# Patient Record
Sex: Female | Born: 1980 | Race: White | Hispanic: No | Marital: Single | Smoking: Former smoker
Health system: Southern US, Community
[De-identification: ages and names within clinical notes are randomized; demographics above are authoritative.]

## PROBLEM LIST (undated history)

## (undated) DIAGNOSIS — M329 Systemic lupus erythematosus, unspecified: Secondary | ICD-10-CM

## (undated) DIAGNOSIS — E039 Hypothyroidism, unspecified: Secondary | ICD-10-CM

## (undated) DIAGNOSIS — F419 Anxiety disorder, unspecified: Secondary | ICD-10-CM

## (undated) DIAGNOSIS — R000 Tachycardia, unspecified: Secondary | ICD-10-CM

## (undated) DIAGNOSIS — E119 Type 2 diabetes mellitus without complications: Secondary | ICD-10-CM

## (undated) DIAGNOSIS — F329 Major depressive disorder, single episode, unspecified: Secondary | ICD-10-CM

## (undated) DIAGNOSIS — E079 Disorder of thyroid, unspecified: Secondary | ICD-10-CM

## (undated) DIAGNOSIS — K7460 Unspecified cirrhosis of liver: Secondary | ICD-10-CM

## (undated) DIAGNOSIS — J45909 Unspecified asthma, uncomplicated: Secondary | ICD-10-CM

## (undated) DIAGNOSIS — K8020 Calculus of gallbladder without cholecystitis without obstruction: Secondary | ICD-10-CM

## (undated) DIAGNOSIS — F319 Bipolar disorder, unspecified: Secondary | ICD-10-CM

## (undated) DIAGNOSIS — K760 Fatty (change of) liver, not elsewhere classified: Secondary | ICD-10-CM

## (undated) DIAGNOSIS — G8929 Other chronic pain: Secondary | ICD-10-CM

## (undated) DIAGNOSIS — R51 Headache: Secondary | ICD-10-CM

## (undated) DIAGNOSIS — K219 Gastro-esophageal reflux disease without esophagitis: Secondary | ICD-10-CM

## (undated) DIAGNOSIS — E785 Hyperlipidemia, unspecified: Secondary | ICD-10-CM

## (undated) DIAGNOSIS — R911 Solitary pulmonary nodule: Secondary | ICD-10-CM

## (undated) DIAGNOSIS — F32A Depression, unspecified: Secondary | ICD-10-CM

## (undated) DIAGNOSIS — R932 Abnormal findings on diagnostic imaging of liver and biliary tract: Secondary | ICD-10-CM

## (undated) DIAGNOSIS — E559 Vitamin D deficiency, unspecified: Secondary | ICD-10-CM

## (undated) DIAGNOSIS — I10 Essential (primary) hypertension: Secondary | ICD-10-CM

## (undated) HISTORY — DX: Vitamin D deficiency, unspecified: E55.9

## (undated) HISTORY — DX: Abnormal findings on diagnostic imaging of liver and biliary tract: R93.2

## (undated) HISTORY — DX: Fatty (change of) liver, not elsewhere classified: K76.0

## (undated) HISTORY — DX: Hyperlipidemia, unspecified: E78.5

## (undated) HISTORY — DX: Bipolar disorder, unspecified: F31.9

## (undated) HISTORY — DX: Calculus of gallbladder without cholecystitis without obstruction: K80.20

## (undated) HISTORY — DX: Depression, unspecified: F32.A

## (undated) HISTORY — DX: Unspecified asthma, uncomplicated: J45.909

## (undated) HISTORY — DX: Tachycardia, unspecified: R00.0

## (undated) HISTORY — DX: Essential (primary) hypertension: I10

## (undated) HISTORY — DX: Systemic lupus erythematosus, unspecified: M32.9

## (undated) HISTORY — DX: Other chronic pain: G89.29

## (undated) HISTORY — DX: Headache: R51

## (undated) HISTORY — DX: Anxiety disorder, unspecified: F41.9

## (undated) HISTORY — DX: Hypothyroidism, unspecified: E03.9

## (undated) HISTORY — DX: Unspecified cirrhosis of liver: K74.60

## (undated) HISTORY — DX: Gastro-esophageal reflux disease without esophagitis: K21.9

## (undated) HISTORY — DX: Major depressive disorder, single episode, unspecified: F32.9

## (undated) HISTORY — DX: Solitary pulmonary nodule: R91.1

## (undated) HISTORY — DX: Type 2 diabetes mellitus without complications: E11.9

## (undated) HISTORY — PX: DILATION AND CURETTAGE OF UTERUS: SHX78

## (undated) HISTORY — DX: Disorder of thyroid, unspecified: E07.9

---
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
## 2003-07-23 ENCOUNTER — Emergency Department (HOSPITAL_COMMUNITY): Admission: EM | Admit: 2003-07-23 | Discharge: 2003-07-23 | Payer: Self-pay | Admitting: Emergency Medicine

## 2003-08-06 ENCOUNTER — Emergency Department (HOSPITAL_COMMUNITY): Admission: EM | Admit: 2003-08-06 | Discharge: 2003-08-06 | Payer: Self-pay | Admitting: Emergency Medicine

## 2003-09-10 ENCOUNTER — Ambulatory Visit (HOSPITAL_COMMUNITY): Admission: RE | Admit: 2003-09-10 | Discharge: 2003-09-10 | Payer: Self-pay | Admitting: Neurology

## 2003-09-29 ENCOUNTER — Ambulatory Visit (HOSPITAL_COMMUNITY): Admission: RE | Admit: 2003-09-29 | Discharge: 2003-09-29 | Payer: Self-pay | Admitting: Family Medicine

## 2004-07-11 ENCOUNTER — Other Ambulatory Visit: Admission: RE | Admit: 2004-07-11 | Discharge: 2004-07-11 | Payer: Self-pay | Admitting: Family Medicine

## 2004-07-12 ENCOUNTER — Other Ambulatory Visit: Admission: RE | Admit: 2004-07-12 | Discharge: 2004-07-12 | Payer: Self-pay | Admitting: Family Medicine

## 2005-09-18 ENCOUNTER — Ambulatory Visit (HOSPITAL_COMMUNITY): Payer: Self-pay | Admitting: Psychiatry

## 2005-10-09 ENCOUNTER — Ambulatory Visit (HOSPITAL_COMMUNITY): Payer: Self-pay | Admitting: Psychiatry

## 2005-11-05 ENCOUNTER — Ambulatory Visit (HOSPITAL_COMMUNITY): Payer: Self-pay | Admitting: Psychiatry

## 2005-11-26 ENCOUNTER — Ambulatory Visit (HOSPITAL_COMMUNITY): Payer: Self-pay | Admitting: Psychiatry

## 2005-12-24 ENCOUNTER — Ambulatory Visit (HOSPITAL_COMMUNITY): Payer: Self-pay | Admitting: Psychiatry

## 2006-01-02 ENCOUNTER — Inpatient Hospital Stay (HOSPITAL_COMMUNITY): Admission: AD | Admit: 2006-01-02 | Discharge: 2006-01-03 | Payer: Self-pay | Admitting: Psychiatry

## 2006-01-02 ENCOUNTER — Ambulatory Visit: Payer: Self-pay | Admitting: Psychiatry

## 2006-01-09 ENCOUNTER — Ambulatory Visit (HOSPITAL_COMMUNITY): Payer: Self-pay | Admitting: Psychiatry

## 2006-01-21 ENCOUNTER — Encounter: Admission: RE | Admit: 2006-01-21 | Discharge: 2006-01-21 | Payer: Self-pay | Admitting: Obstetrics and Gynecology

## 2006-02-11 ENCOUNTER — Ambulatory Visit (HOSPITAL_COMMUNITY): Payer: Self-pay | Admitting: Psychiatry

## 2006-02-18 ENCOUNTER — Encounter: Admission: RE | Admit: 2006-02-18 | Discharge: 2006-02-18 | Payer: Self-pay | Admitting: Obstetrics and Gynecology

## 2006-03-19 ENCOUNTER — Ambulatory Visit (HOSPITAL_COMMUNITY): Payer: Self-pay | Admitting: Psychiatry

## 2006-04-09 ENCOUNTER — Emergency Department (HOSPITAL_COMMUNITY): Admission: EM | Admit: 2006-04-09 | Discharge: 2006-04-10 | Payer: Self-pay | Admitting: Emergency Medicine

## 2006-05-06 ENCOUNTER — Ambulatory Visit (HOSPITAL_COMMUNITY): Payer: Self-pay | Admitting: Psychiatry

## 2006-06-12 ENCOUNTER — Ambulatory Visit (HOSPITAL_COMMUNITY): Payer: Self-pay | Admitting: Psychiatry

## 2006-06-24 ENCOUNTER — Ambulatory Visit (HOSPITAL_COMMUNITY): Payer: Self-pay | Admitting: Psychiatry

## 2006-07-17 ENCOUNTER — Ambulatory Visit (HOSPITAL_COMMUNITY): Payer: Self-pay | Admitting: Psychiatry

## 2006-07-24 ENCOUNTER — Emergency Department (HOSPITAL_COMMUNITY): Admission: EM | Admit: 2006-07-24 | Discharge: 2006-07-24 | Payer: Self-pay | Admitting: Emergency Medicine

## 2006-08-07 ENCOUNTER — Ambulatory Visit (HOSPITAL_COMMUNITY): Payer: Self-pay | Admitting: Psychiatry

## 2006-08-26 ENCOUNTER — Encounter: Admission: RE | Admit: 2006-08-26 | Discharge: 2006-08-28 | Payer: Self-pay | Admitting: Unknown Physician Specialty

## 2006-09-03 ENCOUNTER — Ambulatory Visit (HOSPITAL_COMMUNITY): Payer: Self-pay | Admitting: Psychiatry

## 2006-09-04 ENCOUNTER — Emergency Department (HOSPITAL_COMMUNITY): Admission: EM | Admit: 2006-09-04 | Discharge: 2006-09-04 | Payer: Self-pay | Admitting: Emergency Medicine

## 2006-09-28 ENCOUNTER — Emergency Department (HOSPITAL_COMMUNITY): Admission: EM | Admit: 2006-09-28 | Discharge: 2006-09-28 | Payer: Self-pay | Admitting: Emergency Medicine

## 2006-10-01 ENCOUNTER — Ambulatory Visit (HOSPITAL_COMMUNITY): Payer: Self-pay | Admitting: Psychiatry

## 2006-10-27 ENCOUNTER — Ambulatory Visit (HOSPITAL_COMMUNITY): Payer: Self-pay | Admitting: Psychiatry

## 2006-11-20 ENCOUNTER — Emergency Department (HOSPITAL_COMMUNITY): Admission: EM | Admit: 2006-11-20 | Discharge: 2006-11-20 | Payer: Self-pay | Admitting: Emergency Medicine

## 2006-12-22 ENCOUNTER — Ambulatory Visit (HOSPITAL_COMMUNITY): Payer: Self-pay | Admitting: Psychiatry

## 2006-12-28 ENCOUNTER — Ambulatory Visit (HOSPITAL_COMMUNITY): Payer: Self-pay | Admitting: Psychiatry

## 2007-01-12 ENCOUNTER — Ambulatory Visit: Payer: Self-pay | Admitting: Internal Medicine

## 2007-01-13 ENCOUNTER — Ambulatory Visit (HOSPITAL_COMMUNITY): Admission: RE | Admit: 2007-01-13 | Discharge: 2007-01-13 | Payer: Self-pay | Admitting: Internal Medicine

## 2007-01-27 ENCOUNTER — Ambulatory Visit (HOSPITAL_COMMUNITY): Payer: Self-pay | Admitting: Psychiatry

## 2007-02-03 ENCOUNTER — Ambulatory Visit: Payer: Self-pay | Admitting: Internal Medicine

## 2007-03-23 ENCOUNTER — Emergency Department (HOSPITAL_COMMUNITY): Admission: EM | Admit: 2007-03-23 | Discharge: 2007-03-23 | Payer: Self-pay | Admitting: Emergency Medicine

## 2007-04-19 ENCOUNTER — Telehealth (INDEPENDENT_AMBULATORY_CARE_PROVIDER_SITE_OTHER): Payer: Self-pay | Admitting: *Deleted

## 2007-04-19 DIAGNOSIS — F3289 Other specified depressive episodes: Secondary | ICD-10-CM | POA: Insufficient documentation

## 2007-04-19 DIAGNOSIS — J45909 Unspecified asthma, uncomplicated: Secondary | ICD-10-CM | POA: Insufficient documentation

## 2007-04-19 DIAGNOSIS — F329 Major depressive disorder, single episode, unspecified: Secondary | ICD-10-CM | POA: Insufficient documentation

## 2007-04-19 DIAGNOSIS — J45901 Unspecified asthma with (acute) exacerbation: Secondary | ICD-10-CM | POA: Insufficient documentation

## 2007-05-11 ENCOUNTER — Telehealth: Payer: Self-pay | Admitting: Internal Medicine

## 2007-05-12 ENCOUNTER — Ambulatory Visit: Payer: Self-pay | Admitting: Internal Medicine

## 2007-05-12 DIAGNOSIS — R918 Other nonspecific abnormal finding of lung field: Secondary | ICD-10-CM | POA: Insufficient documentation

## 2007-05-12 DIAGNOSIS — J984 Other disorders of lung: Secondary | ICD-10-CM | POA: Insufficient documentation

## 2007-05-14 ENCOUNTER — Telehealth: Payer: Self-pay | Admitting: Internal Medicine

## 2007-05-18 ENCOUNTER — Ambulatory Visit: Payer: Self-pay | Admitting: Internal Medicine

## 2007-05-18 DIAGNOSIS — J209 Acute bronchitis, unspecified: Secondary | ICD-10-CM | POA: Insufficient documentation

## 2007-05-18 LAB — CONVERTED CEMR LAB: Streptococcus, Group A Screen (Direct): NEGATIVE

## 2007-05-19 ENCOUNTER — Telehealth: Payer: Self-pay | Admitting: Internal Medicine

## 2007-05-20 ENCOUNTER — Ambulatory Visit: Payer: Self-pay | Admitting: Internal Medicine

## 2007-05-20 DIAGNOSIS — F17200 Nicotine dependence, unspecified, uncomplicated: Secondary | ICD-10-CM | POA: Insufficient documentation

## 2007-05-21 ENCOUNTER — Telehealth: Payer: Self-pay | Admitting: Pulmonary Disease

## 2007-05-24 ENCOUNTER — Encounter: Payer: Self-pay | Admitting: Adult Health

## 2007-05-24 ENCOUNTER — Telehealth (INDEPENDENT_AMBULATORY_CARE_PROVIDER_SITE_OTHER): Payer: Self-pay | Admitting: *Deleted

## 2007-05-24 ENCOUNTER — Ambulatory Visit: Payer: Self-pay | Admitting: Internal Medicine

## 2007-05-24 DIAGNOSIS — R05 Cough: Secondary | ICD-10-CM

## 2007-05-24 DIAGNOSIS — R32 Unspecified urinary incontinence: Secondary | ICD-10-CM | POA: Insufficient documentation

## 2007-05-24 DIAGNOSIS — R059 Cough, unspecified: Secondary | ICD-10-CM | POA: Insufficient documentation

## 2007-05-24 LAB — CONVERTED CEMR LAB: Blood Glucose, Fingerstick: 112

## 2007-05-25 LAB — CONVERTED CEMR LAB
Bacteria, UA: NEGATIVE
Bilirubin Urine: NEGATIVE
Crystals: NEGATIVE
Hemoglobin, Urine: NEGATIVE
Ketones, ur: NEGATIVE mg/dL
Leukocytes, UA: NEGATIVE
Mucus, UA: NEGATIVE
Nitrite: NEGATIVE
Specific Gravity, Urine: <=1.005 (ref 1.000–1.03)
Squamous Epithelial / LPF: NEGATIVE /lpf
Total Protein, Urine: NEGATIVE mg/dL
Urine Glucose: NEGATIVE mg/dL
Urobilinogen, UA: 0.2 (ref 0.0–1.0)
hCG, Beta Chain, Quant, S: <2 milliintl units/mL
pH: 6.5 (ref 5.0–8.0)

## 2007-05-28 ENCOUNTER — Telehealth (INDEPENDENT_AMBULATORY_CARE_PROVIDER_SITE_OTHER): Payer: Self-pay | Admitting: *Deleted

## 2007-06-01 ENCOUNTER — Ambulatory Visit: Payer: Self-pay | Admitting: Internal Medicine

## 2007-06-01 DIAGNOSIS — R000 Tachycardia, unspecified: Secondary | ICD-10-CM | POA: Insufficient documentation

## 2007-06-07 ENCOUNTER — Telehealth (INDEPENDENT_AMBULATORY_CARE_PROVIDER_SITE_OTHER): Payer: Self-pay | Admitting: *Deleted

## 2007-06-20 ENCOUNTER — Emergency Department (HOSPITAL_COMMUNITY): Admission: EM | Admit: 2007-06-20 | Discharge: 2007-06-20 | Payer: Self-pay | Admitting: Emergency Medicine

## 2007-08-19 ENCOUNTER — Telehealth (INDEPENDENT_AMBULATORY_CARE_PROVIDER_SITE_OTHER): Payer: Self-pay | Admitting: *Deleted

## 2007-08-24 ENCOUNTER — Telehealth (INDEPENDENT_AMBULATORY_CARE_PROVIDER_SITE_OTHER): Payer: Self-pay | Admitting: *Deleted

## 2007-09-08 ENCOUNTER — Telehealth (INDEPENDENT_AMBULATORY_CARE_PROVIDER_SITE_OTHER): Payer: Self-pay | Admitting: *Deleted

## 2007-09-14 ENCOUNTER — Telehealth (INDEPENDENT_AMBULATORY_CARE_PROVIDER_SITE_OTHER): Payer: Self-pay | Admitting: *Deleted

## 2007-09-15 ENCOUNTER — Ambulatory Visit: Payer: Self-pay | Admitting: Internal Medicine

## 2007-09-15 DIAGNOSIS — R0989 Other specified symptoms and signs involving the circulatory and respiratory systems: Secondary | ICD-10-CM | POA: Insufficient documentation

## 2007-09-15 DIAGNOSIS — R0609 Other forms of dyspnea: Secondary | ICD-10-CM | POA: Insufficient documentation

## 2007-09-16 ENCOUNTER — Telehealth: Payer: Self-pay | Admitting: Internal Medicine

## 2007-10-21 ENCOUNTER — Telehealth: Payer: Self-pay | Admitting: Internal Medicine

## 2007-10-21 DIAGNOSIS — R51 Headache: Secondary | ICD-10-CM | POA: Insufficient documentation

## 2007-10-21 DIAGNOSIS — R519 Headache, unspecified: Secondary | ICD-10-CM | POA: Insufficient documentation

## 2007-10-26 ENCOUNTER — Telehealth (INDEPENDENT_AMBULATORY_CARE_PROVIDER_SITE_OTHER): Payer: Self-pay | Admitting: *Deleted

## 2007-10-26 ENCOUNTER — Ambulatory Visit: Payer: Self-pay | Admitting: Cardiovascular Disease

## 2007-10-27 ENCOUNTER — Telehealth: Payer: Self-pay | Admitting: Internal Medicine

## 2007-10-28 ENCOUNTER — Telehealth (INDEPENDENT_AMBULATORY_CARE_PROVIDER_SITE_OTHER): Payer: Self-pay | Admitting: *Deleted

## 2007-10-29 ENCOUNTER — Telehealth: Payer: Self-pay | Admitting: Internal Medicine

## 2007-11-08 ENCOUNTER — Telehealth (INDEPENDENT_AMBULATORY_CARE_PROVIDER_SITE_OTHER): Payer: Self-pay | Admitting: *Deleted

## 2007-11-30 ENCOUNTER — Ambulatory Visit: Payer: Self-pay | Admitting: Internal Medicine

## 2007-11-30 DIAGNOSIS — R079 Chest pain, unspecified: Secondary | ICD-10-CM | POA: Insufficient documentation

## 2007-11-30 DIAGNOSIS — K219 Gastro-esophageal reflux disease without esophagitis: Secondary | ICD-10-CM | POA: Insufficient documentation

## 2007-12-23 ENCOUNTER — Telehealth (INDEPENDENT_AMBULATORY_CARE_PROVIDER_SITE_OTHER): Payer: Self-pay | Admitting: *Deleted

## 2007-12-28 ENCOUNTER — Inpatient Hospital Stay (HOSPITAL_COMMUNITY): Admission: AD | Admit: 2007-12-28 | Discharge: 2007-12-28 | Payer: Self-pay | Admitting: Obstetrics & Gynecology

## 2008-01-10 ENCOUNTER — Telehealth (INDEPENDENT_AMBULATORY_CARE_PROVIDER_SITE_OTHER): Payer: Self-pay | Admitting: *Deleted

## 2008-01-12 ENCOUNTER — Telehealth: Payer: Self-pay | Admitting: Internal Medicine

## 2008-01-13 ENCOUNTER — Ambulatory Visit: Payer: Self-pay | Admitting: Internal Medicine

## 2008-01-13 ENCOUNTER — Telehealth: Payer: Self-pay | Admitting: Internal Medicine

## 2008-02-21 ENCOUNTER — Telehealth (INDEPENDENT_AMBULATORY_CARE_PROVIDER_SITE_OTHER): Payer: Self-pay | Admitting: *Deleted

## 2008-02-22 ENCOUNTER — Ambulatory Visit: Payer: Self-pay | Admitting: Internal Medicine

## 2008-06-14 ENCOUNTER — Telehealth: Payer: Self-pay | Admitting: Pulmonary Disease

## 2008-06-29 ENCOUNTER — Telehealth (INDEPENDENT_AMBULATORY_CARE_PROVIDER_SITE_OTHER): Payer: Self-pay | Admitting: *Deleted

## 2008-07-26 ENCOUNTER — Ambulatory Visit (HOSPITAL_COMMUNITY): Admission: RE | Admit: 2008-07-26 | Discharge: 2008-07-26 | Payer: Self-pay | Admitting: Urology

## 2008-08-18 ENCOUNTER — Telehealth (INDEPENDENT_AMBULATORY_CARE_PROVIDER_SITE_OTHER): Payer: Self-pay | Admitting: *Deleted

## 2008-08-25 ENCOUNTER — Ambulatory Visit: Payer: Self-pay | Admitting: Internal Medicine

## 2008-09-01 ENCOUNTER — Telehealth: Payer: Self-pay | Admitting: Internal Medicine

## 2008-09-21 ENCOUNTER — Telehealth: Payer: Self-pay | Admitting: Internal Medicine

## 2008-10-03 ENCOUNTER — Telehealth (INDEPENDENT_AMBULATORY_CARE_PROVIDER_SITE_OTHER): Payer: Self-pay | Admitting: *Deleted

## 2008-10-10 ENCOUNTER — Telehealth (INDEPENDENT_AMBULATORY_CARE_PROVIDER_SITE_OTHER): Payer: Self-pay | Admitting: *Deleted

## 2008-10-27 ENCOUNTER — Telehealth (INDEPENDENT_AMBULATORY_CARE_PROVIDER_SITE_OTHER): Payer: Self-pay | Admitting: *Deleted

## 2008-11-23 ENCOUNTER — Emergency Department (HOSPITAL_COMMUNITY): Admission: EM | Admit: 2008-11-23 | Discharge: 2008-11-23 | Payer: Self-pay | Admitting: Emergency Medicine

## 2009-08-17 ENCOUNTER — Telehealth (INDEPENDENT_AMBULATORY_CARE_PROVIDER_SITE_OTHER): Payer: Self-pay | Admitting: *Deleted

## 2009-08-21 ENCOUNTER — Telehealth: Payer: Self-pay | Admitting: Internal Medicine

## 2009-09-18 ENCOUNTER — Telehealth: Payer: Self-pay | Admitting: Internal Medicine

## 2009-09-24 ENCOUNTER — Telehealth: Payer: Self-pay | Admitting: Internal Medicine

## 2009-09-24 ENCOUNTER — Encounter: Payer: Self-pay | Admitting: Internal Medicine

## 2009-09-26 ENCOUNTER — Telehealth: Payer: Self-pay | Admitting: Internal Medicine

## 2009-09-28 ENCOUNTER — Encounter: Payer: Self-pay | Admitting: Internal Medicine

## 2009-10-19 ENCOUNTER — Telehealth: Payer: Self-pay | Admitting: Internal Medicine

## 2010-05-11 ENCOUNTER — Encounter: Payer: Self-pay | Admitting: Obstetrics and Gynecology

## 2010-05-12 ENCOUNTER — Encounter: Payer: Self-pay | Admitting: Internal Medicine

## 2010-05-12 ENCOUNTER — Encounter: Payer: Self-pay | Admitting: Family Medicine

## 2010-05-23 NOTE — Progress Notes (Signed)
Summary: cough/ wheezing  Phone Note Call from Patient   Caller: Patient Call For: ramaswamy Summary of Call: when pt coughs/ she is urinating. deep cough/ wheezing. pt had a ct scan perdomed 2 days ago. please call and advise.  [phone]. Initial call taken by: Tivis Ringer,  October 28, 2007 9:48 AM  Follow-up for Phone Call        pt will call back, she is going to call and see if she can arrange a ride to the office for appt which is what i offered  Philipp Deputy [facility]  October 28, 2007 9:56 AM   Additional Follow-up for Phone Call Additional follow up Details #1::        for incontinence -> she should see her PCP who should consider urology or gyn referral. Plese tell her to talk to PCP. She already wanted me to refer her to neuro for headache which i obliged as courtesy  for cough -> if this is asthma she needs to be worked in. Please see if any provider can see her. She prefers someone other than Dr. Sherene Sires in my absence  for nodule ->  i have already explained to her. Next ct scan Oct 2010. I woill place order now  followup wiht me -> in mid august Additional Follow-up by: Kalman Shan MD,  October 28, 2007 9:18 PM    Additional Follow-up for Phone Call Additional follow up Details #2::    SCHEDULED PT TO SEE DR RAMASWAMY ON [facility] 7/13- Follow-up by: Philipp Deputy CMA,  October 29, 2007 4:59 PM

## 2010-05-23 NOTE — Progress Notes (Signed)
Summary: GAGING  Phone Note Call from Patient Call back at [facility] Phone [phone]   Caller: Patient Call For: [facility] Summary of Call: PT IS GAGING UNTIL SHE VOMITS WHAT SHOULD SHE DO SORE THROAT ALSO Initial call taken by: Lacinda Axon,  Aug 24, 2007 12:07 PM  Follow-up for Phone Call        Spoke with pt and advised she come in for ov.  She is sched to see TP on Thurs may 7th at 10:15.  If symptoms worsen, go to er. Follow-up by: Vernie Murders,  Aug 24, 2007 12:22 PM

## 2010-05-23 NOTE — Letter (Signed)
Summary: [facility] [employer] engineer Pulmonary  [address]   [place], [place] [address]   Phone: [phone]  Fax: [phone]    09/24/2009  Emily [name] [address] [place], [place]  [address]  Dear Ms. Vanzandt,    We have been attempting to contact you to reschedule your follow-up appointment, but have been unable to reach you. Please call our office at your earliest convenience. Thank you.         Sincerely,   Nature conservation officer Pulmonary Division

## 2010-05-23 NOTE — Progress Notes (Signed)
Summary: gagging/ vomit  Phone Note Call from Patient   Caller: Patient Call For: ramaswamy Summary of Call: req nurse call. pt is "gagging to the point she is throwing up". this has been going on for a couple of months now[facility] [place]  Initial call taken by: Tivis Ringer,  August 19, 2007 10:44 AM  Follow-up for Phone Call        Pt called .  Pt coughing until she gags and occas vomits.  Offered Ov with provider today.  Pt reports that she is out of her psychotic meds and she can not ride in a car without them.  instructed pt to go to ER if  symptoms did not improve. Follow-up by: Abigail Miyamoto RN,  August 19, 2007 11:13 AM

## 2010-05-23 NOTE — Progress Notes (Signed)
Summary: MED RECORDS  Phone Note From Other Clinic Call back at [phone]   Caller: Referral Coordinator Call For: ALVA Reason for Call: Need Referral Information Summary of Call: NEED RECORDS FOR APPT TOMORROW AM - [phone] [facility] CALLED Initial call taken by: Eugene Gavia,  June 07, 2007 2:05 PM  Follow-up for Phone Call        All recent Ov notes, labs and med list faxed to [facility] at [phone]. Follow-up by: Marinus Maw,  June 07, 2007 3:45 PM

## 2010-05-23 NOTE — Assessment & Plan Note (Signed)
Summary: incr SOB/ pt of MR/ewj   Visit Type:  Acute extended PCP:  [name] at [facility]  Chief Complaint:  Pt c/o increased sob x 2 days.  She gets sob when lies down and while she smokes cigarretes.  She also c/o prod cough x 2 months with green sputum and and some cp x 2 days that she describes as dull and sharp at times..  History of Present Illness: [id number]  30 year old white female actively smoking previous the seen by Dr. Marchelle Gearing in October of 2000 and 84 evaluation of microscopic dominant nodules only seen on CT scanning.  She did not return is requested for follow-up.  She comes in acutely worse than baseline with increasing dyspnea the last two days and a background of two months of productive cough of green sputum especially in the mornings now having diffuse chest discomfort during coughing paroxysms last two days.  She denies any classic pleuritic pain fevers chills sweats or significant nocturnal exacerbations sinus pain or or reflux symptoms.  She has tried using her inhalers with no benefit.  Note however that she is on Inderal chronically for  palpitations.  Pt denies any significant sore throat, nasal congestion or excess secretions, fever, chills, sweats, unintended wt loss, pleuritic or exertional cp, orthopnea pnd or leg swelling.   Pt also denies any obvious fluctuation in symptoms with weather or environmental change or other alleviating or aggravating factors.     Current Allergies: ! PCN   Social History:    Patient is a current smoker.    Risk Factors:  Tobacco use:  current    Year started:  2001    Cigarettes:  Yes -- 1 pack(s) per day   Review of Systems  The patient denies anorexia, fever, weight loss, vision loss, decreased hearing, hoarseness, chest pain, syncope, peripheral edema, hemoptysis, abdominal pain, melena, hematochezia, severe indigestion/heartburn, hematuria, incontinence, genital sores, muscle weakness, suspicious skin  lesions, transient blindness, difficulty walking, depression, unusual weight change, abnormal bleeding, and enlarged lymph nodes.     Vital Signs:  Patient Profile:   30 Years Old Female Weight:      222.50 pounds O2 Sat:      94 % O2 treatment:    Room Air Temp:     97.3 degrees F oral Pulse rate:   99 / minute BP sitting:   110 / 70  (left arm)  Vitals Entered By: Vernie Murders (May 12, 2007 12:17 PM)                 Physical Exam  uncomfortable but certainly not toxic white female much older than stated age in appearance.  She does begin full sentences with mild hoarseness. HEENT mild turbinate edema.  Oropharynx no thrush or excess pnd or cobblestoning.  No JVD or cervical adenopathy. Mild accessory muscle hypertrophy. Trachea midline, nl thryroid. Chest was hyperinflated by percussion with diminished breath sounds and moderate increased exp time without wheeze. Hoover sign positive at late inspiration. Regular rate and rhythm without murmur gallop or rub or increase P2.  Abd: no hsm, nl excursion. Ext warm without C,C or E.       Impression & Recommendations:  Problem # 1:  ASTHMA (ICD-493.90) I'm not sure she said she had an asthma exacerbation that she has asthma and ALT probably not a good idea to use Inderal and therefore I switched her to metoprolol 25 b.i.d. she appears to have an acute exacerbation of asthmatic bronchitis versus  a component of vocal cord dysfunction related to reflux and therefore recommended treating both for now and have her return to see Dr. Harlin Rain for evaluation.   Each maintenance medication was reviewed in detail including most importantly the difference between maintenance and as needed and under what circumstances the prns are to be used.  I spent extra time with the patient today explaining optimal mdi  technique.  This improved from   25-75% Her updated medication list for this problem includes:    Prednisone 10 Mg Tabs (Prednisone)  .Marland Kitchen... 4x2days, 2x2days, 1x2days   Problem # 2:  PULMONARY NODULE (ICD-518.89)  that she is concerned that she has lung cancer or COPD or both.  I emphasized that both are unlikely at this point but that smoking was the greatest risk factor for both and should be addressed sooner rather than later by her through her primary physician. alternatively, she could be referred to quit Smart program on the first floor.   I took this opportunity to educate the patient regarding the consequences of smoking based on all the data we have from the multiple national lung health studies indicating that smoking cessation, not choice of inhalers her physicians, is the most important aspect of care.   Medications Added to Medication List This Visit: 1)  Metoprolol Succinate 25 Mg Tb24 (Metoprolol succinate) .... One two times a day 2)  Levaquin 750 Mg Tabs (Levofloxacin) .... One tablet by mouth daily 3)  Prednisone 10 Mg Tabs (Prednisone) .... 4x2days, 2x2days, 1x2days 4)  Prevacid 30 Mg Cpdr (Lansoprazole) .... Take  one 30-60 min before first meal of the day 5)  Hycodan 5-1.5 Mg/63ml Syrp (Hydrocodone-homatropine) .... 2 tsp every 6 h   Patient Instructions: 1)  see Dr Colletta Maryland with PFT's in 4 weeks 2)  GERD (gastroesophageal reflux disease) was discussed. It is a common cause of respiratory symptoms. It commonly presents in the absence of heartburn. GERD can be treated with medication, but also with lifestyle changes including avoidance of late meals, excessive alcohol, smoking cessation, and avoidance of foods and drinks including those that the person notes to cause symptoms, fatty foods, chocolate, peppermint, colas, red wine, and acidic juices such as orange juice. No mint or menthol of any kind    Prescriptions: HYCODAN 5-1.5 MG/5ML  SYRP (HYDROCODONE-HOMATROPINE) 2 tsp every 6 h  #6 oz x 0   Entered and Authorized by:   Nyoka Cowden MD   Signed by:   Nyoka Cowden MD on 05/12/2007   Method  used:   Electronically sent to ...       [facility] [phone][facility]       [address]       [place], [place]  [address]       Ph: [phone] or [phone]       Fax: [phone]   RxID:   [id number] PREDNISONE 10 MG  TABS (PREDNISONE) 4x2days, 2x2days, 1x2days  #14 x 0   Entered and Authorized by:   Nyoka Cowden MD   Signed by:   Nyoka Cowden MD on 05/12/2007   Method used:   Electronically sent to ...       [facility]  [address] [phone]*       [address]       [place], [place]  [address]  Ph: [phone] or [phone]       Fax: [phone]   RxID:   [id number] LEVAQUIN 750 MG  TABS (LEVOFLOXACIN) One tablet by mouth daily  #5 x 0   Entered and Authorized by:   Nyoka Cowden MD   Signed by:   Nyoka Cowden MD on 05/12/2007   Method used:   Electronically sent to ...       [facility] [phone]*       [address]       [place], [place]  [address]       Ph: [phone] or [phone]       Fax: [phone]   RxID:   [id number] METOPROLOL SUCCINATE 25 MG  TB24 (METOPROLOL SUCCINATE) one two times a day  #60 x 11   Entered and Authorized by:   Nyoka Cowden MD   Signed by:   Nyoka Cowden MD on 05/12/2007   Method used:   Electronically sent to ...       [facility] [phone]*       [address]       [place], [place]  [address]       Ph: [phone] or [phone]       Fax: [phone]   RxID:   [id number]  ]   Appended Document: Orders Update    Clinical Lists Changes  Orders: Added new Service order of Est. Patient Level IV (35361) - Signed

## 2010-05-23 NOTE — Progress Notes (Signed)
Summary: URINE PROBLEM  Phone Note Call from Patient   Caller: Patient Call For: RAMASWAMY Summary of Call: UNABLE TO CONTROL HER URINE NEED TO BE REFERRED  PATIENT'S CHART HAS BEEN REQUESTED  Initial call taken by: Rickard Patience,  May 28, 2007 1:45 PM  Follow-up for Phone Call        Advised pt. to contact her pcp.  She will do this today.  Follow-up by: Vernie Murders,  May 28, 2007 2:17 PM

## 2010-05-23 NOTE — Progress Notes (Signed)
Summary: DIFF BREATHINGlmtcb  Phone Note Call from Patient   Caller: Patient Call For: RAMASWAMY Complaint: Headache Summary of Call: PT SAW TP YESTERDAY, BUT STILL HAVING DIFF BREATHING. IS ON PREDNISONE AND INHALER, BUT NOT HELPING. CHART READS TO TP AREA 1/28.  PATIENT'S CHART HAS BEEN REQUESTED Initial call taken by: Tivis Ringer,  May 19, 2007 9:36 AM  Follow-up for Phone Call        pt reports sob is no better - still has deep cough - taking mucinex dm 2 two times a day and hycodan syrup as needed - pt reports sob is worse after coughing spell - prednisone is at 10mg  once daily - instructed pt that she would need to give meds more time to work - told pt that I would send message to tammy parrett for any further changes Follow-up by: Abigail Miyamoto RN,  May 19, 2007 9:56 AM  Additional Follow-up for Phone Call Additional follow up Details #1::        have pt see me on Friday the 30th if not improving, see [name] 29th if worse, with all of her medications in hand, including any and all inhaler devices Additional Follow-up by: Nyoka Cowden MD,  May 20, 2007 7:01 AM    Additional Follow-up for Phone Call Additional follow up Details #2::    Called Pt and left message that Dr. Sherene Sires wants to see her on the 30th or she is to come in today the 29th to see [name] Parrett  if she feels like she cannot wait until the 30th  Additional Follow-up for Phone Call Additional follow up Details #3:: Details for Additional Follow-up Action Taken: pt called, [facility] no answer x 2  LMTCB ..................................................................Marland KitchenBoone Master CNA  May 20, 2007 9:36 AM   pt is vomiting for coughing, and ahaving diarrhea.  sob is still no better, prednisone isn't helping, neither is the mucinex or cough syrup.  asked pt if she wanted to come in today to see TP, she said that she does, but needs to see if she can get a ride.  told pt to call back with answer.  ..................................................................Marland KitchenBoone Master CNA May 20, 2007 11:08am  Pt returned my call at 12N per Tivis Ringer.  Called x2, busy. ..................................................................Marland KitchenBoone Master CNA  May 20, 2007 12:26 PM  Spoke with pt, stated she will be able to come in this afternoon at 3:45p ..................................................................Marland KitchenBoone Master CNA  May 20, 2007 12:29 PM  Additional Follow-up by: [name] Parrett NP,  May 20, 2007 9:24 AM

## 2010-05-23 NOTE — Assessment & Plan Note (Signed)
Summary: rov- ok per erica- mr///kp   Visit Type:  Follow-up PCP:  [name] at [facility]  Chief Complaint:  acute......coughing.....gergling and wheezing...Marland KitchenMarland KitchenMarland Kitchenreviewed meds......  History of Present Illness: Followup Visit. Last visit with me 06/01/2007, Dr. Sherene Sires in interim, me 12/01/2007 and 01/13/2008. See PMHx for issues   OV 01/13/2008: inc cough, new green sputum and inc. wheeze. Still using symbicort fo rescue; use has gone from baseline 2puff daily to 5 times daily this past week (again emphasized this is maintenance).  FEv1 2.03L/60Reporting incontinence with cough (not seen gyn despite multiple prior advice). Still smoking. Stil using propranalol. Rec: doxy, 6d prednisone taper, gyn referral, and switch propranalol to bisoprolol.   OV 02/22/2008: [facility] with pscyh home health aide #asthma - exposed to nephew with swine flu >10 days ago. Now with cough, green sputum, wheeze, dyspnea, all c/w asthma exacerbation. Taking albuterol and symbicort this week only. Prior that was non compliant. Stil does not realize symbicort is maintenance. Denies fever. Fev 1.86L/60%. #Ur incontinence - recurred due to asthma. Forgot to see GYN; #Smoking - unable to quit; #Med Non compliance - does not understand symbicort is maintenance, ran out of singulair, says is taking bisoprolol instead of inderal but does not have it in med list; #Tachycardia - reportedly taking inderal     Updated Prior Medication List: CLONAZEPAM ODT 1 MG  TBDP (CLONAZEPAM) three times a day LAMOTRIGINE 150 MG  TABS (LAMOTRIGINE) 1 by mouth daily at bedtime GEODON 40 MG  CAPS (ZIPRASIDONE HCL) 1 by mouth two times a day PREVACID 30 MG  CPDR (LANSOPRAZOLE) Take  one 30-60 min before first meal of the day SYMBICORT 160-4.5 MCG/ACT  AERO (BUDESONIDE-FORMOTEROL FUMARATE) Use 2 puffs two times a day PEXEVA 20 MG  TABS (PAROXETINE MESYLATE) once daily BISOPROLOL FUMARATE 5 MG TABS (BISOPROLOL FUMARATE) once daily DOXYCYCLINE  MONOHYDRATE 100 MG  CAPS (DOXYCYCLINE MONOHYDRATE) By mouth twice daily PREDNISONE 10 MG  TABS (PREDNISONE) 4 tablets daily x2 days, then 3 tablets daily x 2 days, then 2 tablets daily x2 days, then 1 tablet daily x2 days, then stop  Current Allergies (reviewed today): ! PCN  Past Medical History:    GERD    PULMONARY NODULE (ZOX-096.04) see CT Jan 21, 2007 and Jyly 2009    DEPRESSION (ICD-311)    ASTHMA (ICD-493.90)    ANXIETY    HEADACHES    TACHYCARDIA NOS        #Asthma - Moderate Persistent (fev1 63% in 05/2007 and 2.09L/63 in 8/09) complicated by  Poor comprehension of medication regimen despite mulitple education attempts.         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 Did not switch to bystolic as recommended in feb 2009. Saw [facility] and reportedly cxr, ekg, stress test and echo were normal. Switched to bisoprolol on On 01/13/2008.        #Pulm Nodules - July 2009 shows stable micronodules. Needs next CT in 18 months; Dec 2010        #Smking -  Unable to quit        #Psych - significant depression, anxiety, agarophobia complicating asthma management         #Urinary Incontience - flares up with cough        #GERD -on prevacid        #Headaches - followed by Dr. Pearlean Brownie         Family History:    Reviewed history from 09/15/2007 and no changes required:  Asthma mother          Social History:    Reviewed history from 05/12/2007 and no changes required:       Patient is a current smoker.    Risk Factors: Tobacco use:  current    Year started:  2001    Cigarettes:  Yes -- 1 pack(s) per day   Review of Systems      See HPI   Vital Signs:  Patient Profile:   30 Years Old Female Height:     64 inches Weight:      209 pounds BMI:     36.00 O2 Sat:      94 % O2 treatment:    Room Air Temp:     97.6 degrees F oral Pulse rate:   78 / minute BP sitting:   118 / 60  (left arm) Cuff size:   regular  Pt. in pain?   no  Vitals Entered By: Clarise Cruz Duncan Dull) (February 22, 2008 2:06 PM)                  Physical Exam  General:     well developed, well nourished, in no acute distressobese.   Head:     normocephalic and atraumatic Eyes:     PERRLA/EOM intact; conjunctiva and sclera clear Ears:     TMs intact and clear with normal canals Nose:     no deformity, discharge, inflammation, or lesions Mouth:     Melampatti Class III.  Melampatti Class III.   Neck:     no masses, thyromegaly, or abnormal cervical nodes Chest Wall:     no deformities noted Lungs:     clear bilaterally to auscultation and percussion Heart:     regular rate and rhythm, S1, S2 without murmurs, rubs, gallops, or clicks Abdomen:     bowel sounds positive; abdomen soft and non-tender without masses, or organomegaly Msk:     no deformity or scoliosis noted with normal posture Pulses:     pulses normal Extremities:     no clubbing, cyanosis, edema, or deformity noted Neurologic:     CN II-XII grossly intact with normal reflexes, coordination, muscle strength and tone Skin:     intact without lesions or rashes Cervical Nodes:     no significant adenopathy Psych:     alert and cooperative; normal mood and affect; normal attention span and concentration    Pulmonary Function Test Date: 02/22/2008 Height (in.): 64 Gender: Female  Pre-Spirometry FVC    Value: 2.58 L/min   Pred: 3.6 L/min     % Pred: 70 % FEV1    Value: 1.86 L     Pred: 60 L     % Pred: 3.1 % FEV1/FVC  Value: 72 %     Pred: 87 %     % Pred: 83 % FEF 25-75  Value: 1.36 L/min   Pred: 3.68 L/min     % Pred: 36 %    Impression & Recommendations:  Problem # 1:  ASTHMA (ICD-493.90) Assessment: Deteriorated Is in mild-moderate  exacerbation again. FEv1 is 1.86L (Baseline is 2.06L).  Probably aggravated by non compliance and swine flu exposure. Too late for tamiflu prophylaxis  PLAN Again explained that symbicort is maintenance and she needs to take it 2 puff two times  a day doxy x 8 days 8d prednisone taper use albuterol as needed restart singulair when she can demonstrate compliance with symbicort Visit with [name] P  for med calendar in < 1 week, I asked her aide to ensure patient takes meds regularly. She says it is beyond her purview but will try Take swine flu vaccine when available    The following medications were removed from the medication list:    Prednisone 10 Mg Tabs (Prednisone) .Marland Kitchen... Take 4 tablets daily x2 days, then 2 tablets daily x 2 days,  then 1 tablet daily x2 days, then stop  Her updated medication list for this problem includes:    Symbicort 160-4.5 Mcg/act Aero (Budesonide-formoterol fumarate) ..... Use 2 puffs two times a day    Prednisone 10 Mg Tabs (Prednisone) .Marland KitchenMarland KitchenMarland KitchenMarland Kitchen 4 tablets daily x2 days, then 3 tablets daily x 2 days, then 2 tablets daily x2 days, then 1 tablet daily x2 days, then stop  Orders: Est. Patient Level III (02637)   Medications Added to Medication List This Visit: 1)  Bisoprolol Fumarate 5 Mg Tabs (Bisoprolol fumarate) .... Once daily 2)  Doxycycline Monohydrate 100 Mg Caps (Doxycycline monohydrate) .... By mouth twice daily 3)  Prednisone 10 Mg Tabs (Prednisone) .... 4 tablets daily x2 days, then 3 tablets daily x 2 days, then 2 tablets daily x2 days, then 1 tablet daily x2 days, then stop  Other Orders: Gynecologic Referral (Gyn)   Patient Instructions: 1)  1) [facility] [phone]* (retail) 2)  [address], [place], [place], [place]  [address], Ph: [phone] or [phone] 3)  2) Take doxycycline 1 tablet two times a day x for 6 days 4)  3)Take symbicort 2 puff two times a day - remember this is maintenance and not to be taken as needed. You have to take this regardless of how you feel 5)  4) Take albuterol 2  puff as needed; if using more than 3-4 times daily you need to go to urgent care 6)  5) Take 6 day prednisone taper 7)  7) Go to Gynecologist  for you urinary complaints; we will set up a referral 8)  8)  Take bisoprolol 5mg  daily instead 9)  9) visit with Rubye Oaks our NP to go over your medicines - see her in 1 week. Bring all yoru medicines 10)  10) return to see me in 1 month   Prescriptions: PREDNISONE 10 MG  TABS (PREDNISONE) 4 tablets daily x2 days, then 3 tablets daily x 2 days, then 2 tablets daily x2 days, then 1 tablet daily x2 days, then stop  #20 x 0   Entered and Authorized by:   Kalman Shan MD   Signed by:   Kalman Shan MD on 02/22/2008   Method used:   Electronically to        [facility] [phone]* (retail)       [address]       [place], [place]  [address]       Ph: [phone] or [phone]       Fax: [phone]   RxID:   [id number] DOXYCYCLINE MONOHYDRATE 100 MG  CAPS (DOXYCYCLINE MONOHYDRATE) By mouth twice daily  #12 x 0   Entered and Authorized by:   Kalman Shan MD   Signed by:   Kalman Shan MD on 02/22/2008   Method used:   Electronically to        [facility] [phone]* (retail)       [address]  [place], [place]  [address]       Ph: [phone] or [phone]       Fax: [phone]   RxID:   [id number]  ]

## 2010-05-23 NOTE — Progress Notes (Signed)
Summary: nos appt  Phone Note Call from Patient   Caller: [email or web address]  Call For: Britten Parady Summary of Call: LMTCB x2 to rsc nos from 6/7. Initial call taken by: Darletta Moll,  September 26, 2009 3:01 PM     Appended Document: nos appt no more calls. She has hx of no shows. Can send certified letter  Appended Document: nos appt pt no showed for acute visit, so I have printed a letter and given to Raliegh Scarlet to send certified.

## 2010-05-23 NOTE — Progress Notes (Signed)
Summary: nos appt  Phone Note Call from Patient   Caller: [email or web address]  Call For: Hayslee Casebolt Summary of Call: [facility] x2 to rsc nos from 5/27. Initial call taken by: Darletta Moll,  Sep 18, 2009 3:23 PM     Appended Document: nos appt send certified letter please  Appended Document: nos appt Dorann Lodge can you please send a certified letter to this patient about the need to reschedule appt. Thanks.   Appended Document: nos appt per TD and Raliegh Scarlet, the protocol will be for the nurse to type up the letter, place it in an addressed envelope and then give to Raliegh Scarlet and she will see that it gets sent certified. The letters do not go to [place]. I have printed letter and given to Raliegh Scarlet.   Appended Document: nos appt pt called into triage and an appt was set for her to see MR so letter not sent.

## 2010-05-23 NOTE — Progress Notes (Signed)
Summary: talk to nurse  Phone Note Call from Patient Call back at Home Phone [phone] Call back at [phone]   Caller: Patient Call For: ramaswamy Summary of Call: Wants to talk to nurse re: lung ct scan. Initial call taken by: Darletta Moll,  August 17, 2009 10:31 AM  Follow-up for Phone Call        pt states she never had CT doen in december due to transportaton issues and she now wants to r/s ct. Pelase advise if ok to r/s or does pt need to see first. Last seen in 08/2008. Please advise.Carron Curie CMA  August 17, 2009 12:03 PM   Additional Follow-up for Phone Call Additional follow up Details #1::        that is correct. Becuase she missed Dec 2010 which was 18 month CT. She can just have one in May 2011 towards end and then see me. CT chest non contrast - this will beh 2 year scan Additional Follow-up by: Kalman Shan MD,  August 17, 2009 2:31 PM    Additional Follow-up for Phone Call Additional follow up Details #2::    Order was sent to [facility] and pt is aware of the above recs per MR.  Pt verbalized understanding. Follow-up by: Vernie Murders,  August 17, 2009 2:43 PM

## 2010-05-23 NOTE — Progress Notes (Signed)
Summary: incr SOB and wheezing  Phone Note Call from Patient   Caller: Patient Call For: RAMASWAMY Summary of Call: SOB WHEEZING AND RAPID HEART RATE WOULD LIKE TO KNOW WHAT SHE CAN TAKE Initial call taken by: Rickard Patience,  December 23, 2007 2:31 PM  Follow-up for Phone Call        Pt states onset of incr heart rate and incr sob today.  Offered appt today.  Pt does not have a ride.  Made appt with [facility] tomorrow at 9:45am.  Advised pt to go to ED if worsens before appt.  Follow-up by: Cloyde Reams RN,  December 23, 2007 2:42 PM

## 2010-05-23 NOTE — Progress Notes (Signed)
  Phone Note Call from Patient   Caller: Patient Summary of Call: Pt saw Dr. Marchelle Gearing earlier in the day.  She is continuing to have the same symptoms of cough, nausea, dizziness, nasal congestion, and vomiting.  She is also having urine incontinence with coughing.  She does c/o chest pain with coughing.  She is still feeling short of breath.  I have advised her that she would need to go to the emergency room for further evaluation to determine if she has a second condition besides asthma which is causing her symptoms.  Initial call taken by: Coralyn Helling MD,  May 21, 2007 1:45 AM         Appended Document:  In further review of her medical records she also has a hx of multiple psychiatric disorders.  If further medical evaluation is unrevealing, then it may be appropriate to have further psychiatric evaluation.

## 2010-05-23 NOTE — Progress Notes (Signed)
Summary: CT  Phone Note From Other Clinic   Caller: [facility]i w/ [facility] Call For: rhonda Summary of Call: wants to know if you have CT results from 03/01/09 - 03/31/09 or if they had been rescheduled? referance case # [id number]. call shelli at [phone] Initial call taken by: Tivis Ringer, CNA,  Aug 21, 2009 12:14 PM  Follow-up for Phone Call        Called [facility] and advised nurse reviewer that pt missed her last 2 scheduled CT Chest Appt due to transportation issues. Gave additional clinic information and was informed that we should have a decisions on wheter this CT Chest would be approved. Rhonda Cobb  Aug 21, 2009 12:45 PM  CT Chest without contrast approved by [facility] and order faxed to [facility]. Rhonda Cobb  Aug 21, 2009 2:46 PM

## 2010-05-23 NOTE — Progress Notes (Signed)
Summary: sick  Phone Note Call from Patient Call back at Home Phone [phone]   Caller: Patient Call For: ramaswamy Reason for Call: Acute Illness Summary of Call: rattling in chest, cough, congestion, doesn't think she can get ride in Initial call taken by: Eugene Gavia,  November 08, 2007 11:08 AM  Follow-up for Phone Call        Called spoke with pt. [place]  Offered appt with another pulmonologist today.  Pt states has rattling in chest worse at night when lays down.  Pt has minimal cough and only occ getting up clear phlegm.  Advised pt she could try Mucinex DM OTC to help thin mucous and loosen and get out of chest.  Pt wants to try.  Advised pt TCB if SOB worsens or starts coughing up colored sputum for an appt to be assessed.  Advise to go to ED if has acute SOB.  Pt agrees. Follow-up by: Cloyde Reams RN,  November 08, 2007 11:17 AM

## 2010-05-23 NOTE — Progress Notes (Signed)
Summary: cough  LMTCBx1  Phone Note Call from Patient Call back at [phone]   Caller: Patient Call For: ramaswamy Summary of Call: pt still have cough and green congestion [facility] [phone] Initial call taken by: Rickard Patience,  October 27, 2008 3:53 PM  Follow-up for Phone Call        ATC pt at home #.  Line busy.  Will try back later.  Aundra Millet Reynolds LPN  October 28, 5186 4:03 PM    LM with family member for pt TCB.  Aundra Millet Reynolds LPN  October 27, 4164 5:04 PM   ATC pt at home #.  NA.  Will try back later.  Aundra Millet Reynolds LPN  October 31, 2008 3:30 PM   LM with family member for pt TCB.  Gweneth Dimitri RN  November 01, 2008 11:52 AM   Additional Follow-up for Phone Call Additional follow up Details #1::        atc no answer.  numerous messages left with no returned phone calls.   Marijo File CMA  November 02, 2008 4:39 PM

## 2010-05-23 NOTE — Progress Notes (Signed)
Summary: cough, needs ROV  Phone Note Call from Patient Call back at [phone]   Caller: Patient Call For: Dr Dalbert Mayotte Summary of Call: Pt called to discuss her cough. Has been persistent dry cough for months, sometimes leads to emesis, urinary incontinence. Little change today compared with prior, although she did have a paroxysm that caused some lightheadedness, no syncope. Non-productive. Not dyspneic or wheezing. She has used [employer], although not using currently. Denies overt GERD or PND. I have asked her to restart delsym and tessalon perles, and to have ROV with Dr Dalbert Mayotte to discuss any further eval, including ? FOB or laryngoscopy, pH probe for GERD, etc. Advised her to call me back if any wheezing or SOB evolve.  Initial call taken by: Leslye Peer MD,  August 18, 2008 7:39 PM  Follow-up for Phone Call        okay  Iwil see her.  Follow-up by: Kalman Shan MD,  Aug 21, 2008 9:29 AM  Additional Follow-up for Phone Call Additional follow up Details #1::        Victorino Dike, do you know if MR wanted to see this patient today?  msg was sent to Triage and MR has no openings for this morning. Boone Master CNA  Aug 21, 2008 10:58 AM     Additional Follow-up for Phone Call Additional follow up Details #2::    per MR offer patient next available appointment Follow-up by: Carron Curie CMA,  Aug 21, 2008 11:36 AM  Additional Follow-up for Phone Call Additional follow up Details #3:: Details for Additional Follow-up Action Taken: Called spoke with pt.  Made OV with MR for 08/23/08. Additional Follow-up by: Cloyde Reams RN,  Aug 21, 2008 11:43 AM

## 2010-05-23 NOTE — Progress Notes (Signed)
Summary: breathing problem  Phone Note Call from Patient   Caller: Patient Call For: ramaswama Summary of Call: having breathing problem pharmacy [facility] Initial call taken by: Rickard Patience,  Sep 08, 2007 4:51 PM  Follow-up for Phone Call        Spoke with pt.  She states that she started back on geodon one month ago and now is having sob.  She states that her o2 dropped down to 89%ra last night.  She is still sob today.  I advised ov, she will see [name] Parrett tommorrow at 11:30.  If worsens, go to er. Follow-up by: Vernie Murders,  Sep 08, 2007 5:05 PM

## 2010-05-23 NOTE — Progress Notes (Signed)
Summary: nurse call  Phone Note Call from Patient Call back at Home Phone [phone]   Caller: Patient Call For: Dearl Rudden Reason for Call: Talk to Nurse, Talk to Doctor Summary of Call: re: spots on her lungs chart ordered Initial call taken by: Eugene Gavia,  May 14, 2007 3:52 PM  Follow-up for Phone Call        Called spoke with pt.  States she has 2 spots on her lungs.  Pt states she coughs so hard she urinates on herself.  Pt worried about the spots would like to speak with the doctor about. Follow-up by: Cloyde Reams RN,  May 14, 2007 3:59 PM  Additional Follow-up for Phone Call Additional follow up Details #1::        please haver he schedule a visit. the cpough and nodules might or might not be related Additional Follow-up by: Kalman Shan MD,  May 18, 2007 5:31 PM    Additional Follow-up for Phone Call Additional follow up Details #2::    pt seen by Rubye Oaks on 05-18-07 in office Follow-up by: Abigail Miyamoto RN,  May 19, 2007 9:23 AM

## 2010-05-23 NOTE — Progress Notes (Signed)
Summary: cough  Phone Note Call from Patient Call back at Work Phone [phone]   Caller: Patient Call For: ramaswamy Reason for Call: Talk to Nurse Summary of Call: really bad cough.  So bad can't control bladder and gagging to where she vomits. Initial call taken by: Eugene Gavia,  June 29, 2008 3:00 PM  Follow-up for Phone Call        Called spoke with pt.  Advised needs OV to assess cough, offered appt today, pt declined.  Made OV with TP for 06/30/08. Follow-up by: Cloyde Reams RN,  June 29, 2008 3:50 PM

## 2010-05-23 NOTE — Progress Notes (Signed)
Summary: ct results  Phone Note Call from Patient Call back at Home Phone [phone]   Caller: Patient Call For: Naveena Eyman Summary of Call: pt wants ct results--returning call to dr Marchelle Gearing Initial call taken by: Lacinda Axon,  October 29, 2007 1:08 PM  Follow-up for Phone Call        I already gave her the ct results. Kalman Shan MD  October 29, 2007 10:29 PM all documented Follow-up by: Kalman Shan MD,  October 29, 2007 10:29 PM  Additional Follow-up for Phone Call Additional follow up Details #1::        Pt contacted and given phone number to [facility]. Pt must make appt with [facility] b/c they need financial informatioon that we can't provide. Pt aware and was also given [facility]. phone number. Pt has been there before and will be able to make own appt.  Additional Follow-up by: Alfonso Ramus,  November 01, 2007 10:24 AM

## 2010-05-23 NOTE — Progress Notes (Signed)
  Phone Note Call from Patient   Caller: Patient Call For: Dr Marchelle Gearing Reason for Call: Acute Illness Complaint: Cough/Sore throat Action Taken: Appt Scheduled Details for Reason: bad cough Summary of Call: non productive, causing urinary incontinence, no wheeze, fevers,dyspnea. Offered office visit for 2/25 with TP - she will call in am to confirm Initial call taken by: Comer Locket. Vassie Loll MD,  June 14, 2008 9:38 PM  Follow-up for Phone Call        she should see someone in some urgent care or urgent setting Follow-up by: Kalman Shan MD,  June 16, 2008 12:35 PM  Additional Follow-up for Phone Call Additional follow up Details #1::        [facility] informing pt to call our office to schedule ov if she is still sick and wants to be seen or she can go to urgent care/ed.  Aundra Millet Reynolds LPN  June 16, 2008 3:27 PM

## 2010-05-23 NOTE — Progress Notes (Signed)
Summary: req to be seen today/ cough  Phone Note Call from Patient   Caller: Patient Call For: ramswamy/ nurse Summary of Call: pt wants to be seen today for coughing to the point of vomiting. (see last emr phone note). [phone] Initial call taken by: Tivis Ringer,  October 10, 2008 9:30 AM  Follow-up for Phone Call        lmtcb  Philipp Deputy [facility]  October 10, 2008 11:14 AM   called and spoke with pt. offered her an appt today. pt declined stated she did not have a ride  today.  offered to make pt an appt later this week when she would have a ride.  pt again declined stating she will call back to schedule appt.  Aundra Millet Reynolds LPN  October 11, 2008 9:51 AM

## 2010-05-23 NOTE — Progress Notes (Signed)
Summary: sick call  Phone Note Call from Patient   Caller: Patient Call For: Emily [name] Summary of Call: Pt called in complaining of increased SOB.  Pt last seen by MR on 02/03/07.  Did not have PFT's done as instructed.  Advised pt office closing at 2p.  Offered appt with MD tomorrow to assess.  Pt agrees made appt with MW for tomorrow to be seen.  Advised pt if SOB worsens need to go to urgent care or ED to be assessed. Initial call taken by: Cloyde Reams RN,  May 11, 2007 1:55 PM

## 2010-05-23 NOTE — Progress Notes (Signed)
Summary: flu  Phone Note Call from Patient   Caller: Patient Call For: [facility] Summary of Call: pt c/o cough w/ greenish phlegm. chills but no fever x 1 week. aches in legs/ feet. pt lives w/ nephew and nephew had H1N1. he has since recovered from this. [phone] Initial call taken by: Tivis Ringer,  February 21, 2008 12:17 PM  Follow-up for Phone Call        PT HAS NOT RECEIVED CALL BACK FROM NURSE CALL BACK AT 2:00  Additional Follow-up for Phone Call Additional follow up Details #1::        called patient: c/o cough, congestion, wheeze, and nose extra stuff x 1 week. Exposed to live in nephew with confirmed swine H1N1. She does nto feel like she needs to go to ER. Asked to come and see me in office tommorrow 02/22/2008 am as add on.  Pls give her appt Kalman Shan MD  February 21, 2008 4:13 PM  Additional Follow-up by: Kalman Shan MD,  February 21, 2008 4:13 PM    Additional Follow-up for Phone Call Additional follow up Details #2::    Thank Sydnee Cabal for scheduling this! Cloyde Reams RN  February 21, 2008 4:18 PM

## 2010-05-23 NOTE — Progress Notes (Signed)
Summary: sick  Phone Note Call from Patient Call back at Home Phone [phone]   Caller: Patient Call For: ramaswamy Reason for Call: Talk to Nurse Summary of Call: lots of plegm, cough, gag and then vomit, not eating.  Called pcp.  Please advise pt. Initial call taken by: Eugene Gavia,  October 03, 2008 2:49 PM  Follow-up for Phone Call        called and spoke with pt. informed pt MR out of office.  Offered pt an appt with another provider today.  Pt states she will check to see if she has a ride and will call me right back.  Aundra Millet Reynolds LPN  October 03, 2008 2:55 PM   LMTCB.Michel Bickers CMA  October 04, 2008 10:41 AM  Additional Follow-up for Phone Call Additional follow up Details #1::        Called and family member states patient was not home. I left message with family member to have patient call us back. family member agreed. This is the 3rd call to patient. Per protocol I will sign off on note and await return call from patient. Additional Follow-up by: Abigail Miyamoto RN,  October 05, 2008 3:24 PM

## 2010-05-23 NOTE — Assessment & Plan Note (Signed)
Summary: diff breathing, severe cough causing pt to vomit///jj   PCP:  [name] at [facility]  Chief Complaint:  coughing N&V .Marland Kitchen.review meds.  History of Present Illness: Saw Dr. Sherene Sires on Acute Basis 05/12/2007 with increased sob x 2 days.  She gets sob when lies down and while she smokes cigarretes.  She also c/o prod cough x 2 months with green sputum and and some cp x 2 days that she describes as dull and sharp at times. Was placed on Levaquin 750mg  on day 4/5 and prendisone taper, 2 days left. Inderal was changed to metoprolol. Started on Prevacid once daily. and given hycodan for cough.   On 1/27 saw Nurse Practitioner [name] Parrett: Returns today for persistent cough, congestion, and wheezing. Sh Returns today with no improvement in symptoms. Started on symbicort and levaquin  Returns today: no better. Cough, Nausea/Vomit with cough, Incontinence wiht cough. Wheezing. No sputum. No other complaints. Denies pregnancy. Says above interventions have not helped   Current Allergies (reviewed today): ! PCN Updated/Current Medications (including changes made in today's visit):  CLONAZEPAM 1 MG  TABS (CLONAZEPAM) 1 by mouth daily PEXEVA 20 MG  TABS (PAROXETINE MESYLATE) 1 by mouth daily LAMOTRIGINE 150 MG  TABS (LAMOTRIGINE) 1 by mouth daily GEODON 40 MG  CAPS (ZIPRASIDONE HCL) 1 by mouth two times a day DOXYCYCLINE HYCLATE 20 MG  TABS (DOXYCYCLINE HYCLATE) 1 by mouth two times a day METOPROLOL SUCCINATE 25 MG  TB24 (METOPROLOL SUCCINATE) one two times a day PREDNISONE 10 MG  TABS (PREDNISONE) 4x2days, 2x2days, 1x2days PREVACID 30 MG  CPDR (LANSOPRAZOLE) Take  one 30-60 min before first meal of the day HYCODAN 5-1.5 MG/5ML  SYRP (HYDROCODONE-HOMATROPINE) 2 tsp every 6 h LEVAQUIN 750 MG  TABS (LEVOFLOXACIN) 1 by mouth once daily SYMBICORT 160-4.5 MCG/ACT  AERO (BUDESONIDE-FORMOTEROL FUMARATE) Two puffs twice daily PREDNISONE 10 MG  TABS (PREDNISONE) Take 6 tablets daily x3 days,  then 4 tablets daily x3 days, then 3 tablets daily x 3 days, then 2 tablets daily x3 days, then 1 tablet daily x3 days, then stop NASONEX 50 MCG/ACT  SUSP (MOMETASONE FUROATE) Two puffs each nostril daily   Past Medical History:    Reviewed history from 05/18/2007 and no changes required:              PULMONARY NODULE (ICD-518.89)       DEPRESSION (ICD-311)       ASTHMA (ICD-493.90)           Family History:    Reviewed history from 04/19/2007 and no changes required:       Asthma       Emphysema:   Social History:    Reviewed history from 05/12/2007 and no changes required:       Patient is a current smoker.    Risk Factors: Tobacco use:  current    Year started:  2001    Cigarettes:  Yes -- 1 pack(s) per day   Review of Systems      See HPI   Vital Signs:  Patient Profile:   30 Years Old Female Height:     64 inches Weight:      224.0 pounds BMI:     38.59 O2 Sat:      97 % O2 treatment:    Room Air Temp:     97.5 degrees F oral Pulse rate:   90 / minute BP sitting:   130 / 60  (left arm) Cuff size:  regular  Pt. in pain?   no  Vitals Entered By: Clarise Cruz Duncan Dull) (May 20, 2007 3:58 PM)                  Physical Exam  General:     well developed, well nourished, in no acute distress Head:     normocephalic and atraumatic Eyes:     PERRLA/EOM intact; conjunctiva and sclera clear Ears:     TMs intact and clear with normal canals Nose:     no deformity, discharge, inflammation, or lesions Mouth:     no deformity or lesionsMelampatti Class II.   Neck:     no masses, thyromegaly, or abnormal cervical nodes Chest Wall:     no deformities noted Lungs:     clear bilaterally to auscultation and percussion. occ wheeze present Heart:     regular rate and rhythm, S1, S2 without murmurs, rubs, gallops, or clicks Abdomen:     bowel sounds positive; abdomen soft and non-tender without masses, or organomegaly Msk:     no deformity or  scoliosis noted with normal posture Pulses:     pulses normal Extremities:     no clubbing, cyanosis, edema, or deformity noted Neurologic:     CN II-XII grossly intact with normal reflexes, coordination, muscle strength and tone Skin:     intact without lesions or rashes Cervical Nodes:     no significant adenopathy Psych:     alert and cooperative; normal mood and affect; normal attention span and concentration   X-ray  Procedure date:  05/20/2007  Findings:      my personal imporession - normal    Impression & Recommendations:  Problem # 1:  ASTHMATIC BRONCHITIS, ACUTE (ICD-466.0) Assessment: Deteriorated Unclear if current symptoms are representative of acute asthma exacerbation but is possible. However, it is possible that it is AE Asthma. Spirometry suggests restriction and obstruction (reduced ratio and small airways) at fev1 50% predicted. So far, this week asthma exacerbation and control measures like prevacid and one week prednisoe have not helped.  PLAN Treat as AE asthma 2 week prednisone taper Increase symbicort 160/4.5 2 puff two times a day Continue prevacid Start nasal steroid If vomit persists, will get urine pregnancy test (denies she is pregnant) The following medications were removed from the medication list:    Levaquin 750 Mg Tabs (Levofloxacin) ..... One tablet by mouth daily    Symbicort 80-4.5 Mcg/act Aero (Budesonide-formoterol fumarate) .Marland Kitchen... 2 puffs two times a day  Her updated medication list for this problem includes:    Doxycycline Hyclate 20 Mg Tabs (Doxycycline hyclate) .Marland Kitchen... 1 by mouth two times a day    Hycodan 5-1.5 Mg/64ml Syrp (Hydrocodone-homatropine) .Marland Kitchen... 2 tsp every 6 h    Levaquin 750 Mg Tabs (Levofloxacin) .Marland Kitchen... 1 by mouth once daily    Symbicort 160-4.5 Mcg/act Aero (Budesonide-formoterol fumarate) .Marland Kitchen..Marland Kitchen Two puffs twice daily  Orders: Est. Patient Level III (40347)   Problem # 2:  TOBACCO ABUSE (ICD-305.1) Needs to quit.  She knows the ill effects  but has depression and therefoire finds it tough to quit  PLAN Address at future visits Orders: Est. 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 PAtient explained of low risk for cancer of those nodules  Orders: T-1 View CXR, Same Day (71010.6TC) Est. Patient Level III (63875)   Medications Added to Medication List This Visit: 1)  Symbicort 160-4.5 Mcg/act Aero (Budesonide-formoterol fumarate) .... Two puffs  twice daily 2)  Prednisone 10 Mg Tabs (Prednisone) .... Take 6 tablets daily x3 days, then 4 tablets daily x3 days, then 3 tablets daily x 3 days, then 2 tablets daily x3 days, then 1 tablet daily x3 days, then stop 3)  Nasonex 50 Mcg/act Susp (Mometasone furoate) .... Two puffs each nostril daily   Patient Instructions: 1)  Full PFTs in 3 weeks 2)  Return to clinic in 3 weeks 3)  Take meds as prescribed 4)  Try to quit smoking    Prescriptions: NASONEX 50 MCG/ACT  SUSP (MOMETASONE FUROATE) Two puffs each nostril daily  #1 x 1   Entered and Authorized by:   Kalman Shan MD   Signed by:   Kalman Shan MD on 05/20/2007   Method used:   Electronically sent to ...       [facility] [phone][facility]       [address]       [place], [place]  [address]       Ph: [phone] or [phone]       Fax: [phone]   RxID:   [id number] PREDNISONE 10 MG  TABS (PREDNISONE) Take 6 tablets daily x3 days, then 4 tablets daily x3 days, then 3 tablets daily x 3 days, then 2 tablets daily x3 days, then 1 tablet daily x3 days, then stop  #48 x 0   Entered and Authorized by:   Kalman Shan MD   Signed by:   Kalman Shan MD on 05/20/2007   Method used:   Electronically sent to ...       [facility] [phone]*       [address]       [place], [place]  [address]       Ph: [phone] or [phone]       Fax: [phone]    RxID:   [id number] SYMBICORT 160-4.5 MCG/ACT  AERO (BUDESONIDE-FORMOTEROL FUMARATE) Two puffs twice daily  #1 x 1   Entered and Authorized by:   Kalman Shan MD   Signed by:   Kalman Shan MD on 05/20/2007   Method used:   Electronically sent to ...       [facility] [phone]*       [address]       [place], [place]  [address]       Ph: [phone] or [phone]       Fax: [phone]   RxID:   [id number] NASONEX 50 MCG/ACT  SUSP (MOMETASONE FUROATE) Two puffs each nostril daily  #1 x 1   Entered and Authorized by:   Kalman Shan MD   Signed by:   Kalman Shan MD on 05/20/2007   Method used:   Electronically sent to ...       [facility] [phone]*       [address]       [place], [place]  [address]       Ph: [phone] or [phone]       Fax: [phone]   RxID:   [id number] PREDNISONE 10 MG  TABS (PREDNISONE) Take 6 tablets daily x3 days, then 4 tablets daily x3 days, then 3 tablets daily x 3 days, then 2 tablets daily x3 days, then 1  tablet daily x3 days, then stop  #48 x 0   Entered and Authorized by:   Kalman Shan MD   Signed by:   Kalman Shan MD on 05/20/2007   Method used:   Electronically sent to ...       [facility] [phone]*       [address]       [place], [place]  [address]       Ph: [phone] or [phone]       Fax: [phone]   RxID:   [id number] SYMBICORT 160-4.5 MCG/ACT  AERO (BUDESONIDE-FORMOTEROL FUMARATE) Two puffs twice daily  #1 x 1   Entered and Authorized by:   Kalman Shan MD   Signed by:   Kalman Shan MD on 05/20/2007   Method used:   Electronically sent to ...       [facility] [phone]*       [address]       [place], [place]  [address]       Ph: [phone] or [phone]       Fax: [phone]   RxID:   [id number]  ]     SPIROMETRY RESULTS  Date:05/20/2007 height inches: 64 Gender: Female  Parameter  Measured Predicted %Predicted  FVC      2.12        3.84      55.00  FEV1      1.56        3.33      47  FEV1/FVC      0.74        0.87      85  FEF25-75      1.16        4.02      29.00  PEF      3.44        451      1  Post-Bronchodilator  Parameter Measured Change from Baseline  FVC               FEV1               FEV1/FVC                FEF25-75                PEF               Pulse Oximetry Pulse: 90 O2 Saturation %: 97  Comments: mixed restn and obstin on spiro.   Evaluation:  moderate obstruction with NO significant bronchodilator response  Recommendations:   treat with increased symbiocrot, control post nasal drip, 2 week pred taper and reassess

## 2010-05-23 NOTE — Progress Notes (Signed)
Summary: Notified pt  Phone Note Call from Patient Call back at Work Phone [phone]   Caller: Patient Call For: Yolanda Huffstetler Summary of Call: needs inhaler called in [facility]  Initial call taken by: Lacinda Axon,  September 21, 2008 12:12 PM  Follow-up for Phone Call        Called spoke with pt.  Requesting refill on her albuterol relief inhaler.  No relief inhaler on pt's med list.  Please advise if OK to rx.  Also addressed previous phone message reguarding pt vomiting phlegm.  Pt states she never filled rx for chlorpheneramine, states it was too expensive.  Advised pt to fill rx it is OTC and very cheap and will help her post-nasal drainage and hopefully prevent her vomiting.  Pt agrees to try as MR previously rx. Follow-up by: Cloyde Reams RN,  September 21, 2008 12:20 PM  Additional Follow-up for Phone Call Additional follow up Details #1::        ok for albuterol. Thanks for encouraging her to take chlorpheniramine Additional Follow-up by: Kalman Shan MD,  September 22, 2008 9:14 AM    New/Updated Medications: VENTOLIN HFA 108 (90 BASE) MCG/ACT AERS (ALBUTEROL SULFATE) 2 puffs every 4 hours as needed   Prescriptions: VENTOLIN HFA 108 (90 BASE) MCG/ACT AERS (ALBUTEROL SULFATE) 2 puffs every 4 hours as needed  #1 x 2   Entered by:   Renold Genta RCP, LPN   Authorized by:   Kalman Shan MD   Signed by:   Renold Genta RCP, LPN on 56/21/3086   Method used:   Electronically to        [facility] [phone]* (retail)       [address]       [place], [place]  [address]       Ph: [phone] or [phone]       Fax: [phone]   RxID:   [id number]

## 2010-05-23 NOTE — Progress Notes (Signed)
Summary: sick  Phone Note Call from Patient Call back at Home Phone [phone]   Caller: Patient Call For: Emily [name] Reason for Call: Talk to Nurse Summary of Call: pt coughing, can't hold bladder when she coughs.  Needs to know what she can do.  Has missed 5 days of school. Initial call taken by: Eugene Gavia,  January 12, 2008 2:36 PM  Follow-up for Phone Call        APPT 01-13-08 with MR. Follow-up by: Reynaldo Minium CMA,  January 12, 2008 2:42 PM

## 2010-05-23 NOTE — Progress Notes (Signed)
Summary: PROBLEM  Phone Note Call from Patient Call back at Home Phone [phone]   Caller: Patient Call For: [facility] Summary of Call: PT SAW  WERT YESTERDAY PT WAS NOT HAPPY AFTER THE VISIT STILL HAVING SOB  Initial call taken by: Lacinda Axon,  Sep 16, 2007 11:43 AM  Follow-up for Phone Call        called and spoke with pt about this----see other phone note Follow-up by: Marijo File CMA,  Sep 16, 2007 4:12 PM

## 2010-05-23 NOTE — Progress Notes (Signed)
Summary: sick--Pt to CB for ov  Phone Note Call from Patient Call back at Home Phone [phone]   Caller: Patient Call For: ramaswamy Reason for Call: Talk to Nurse Summary of Call: pt coughing and wheezing, wants to know what she can do.  Offered appt w/ tp Initial call taken by: Eugene Gavia,  October 26, 2007 2:27 PM  Follow-up for Phone Call        Pt unsure if she can be here by 3:15 for ov with TP today. She will call back. Michel Bickers CMA  October 26, 2007 2:48 PM

## 2010-05-23 NOTE — Progress Notes (Signed)
Summary: cough  Phone Note Call from Patient Call back at Home Phone [phone]   Caller: Patient Call For: Wednesday Ericsson Reason for Call: Talk to Nurse Summary of Call: coughed real hard, gagged, coughed up orange colored plegm, slept most of day today and woke up to back & chest pain.  Please advise. [facility] Initial call taken by: Eugene Gavia,  September 24, 2009 4:21 PM  Follow-up for Phone Call        pt called and c/o productive cough. We were about to mail her a certified letter because she needed an appt. Pt set to see MR tomorrow at 2:10, 09-25-09. Carron Curie CMA  September 24, 2009 4:31 PM

## 2010-05-23 NOTE — Letter (Signed)
Summary: Certified Letter Receipt  Certified Letter Receipt   Imported By: Sherian Rein 11/12/2009 14:38:55  _____________________________________________________________________  External Attachment:    Type:   Image     Comment:   External Document

## 2010-05-23 NOTE — Progress Notes (Signed)
Summary: breathing problems  Phone Note Call from Patient Call back at Home Phone [phone]   Caller: Patient Call For: ramasawamy Summary of Call: breathing problems/offered 2:50 with alva but needed later Initial call taken by: Lacinda Axon,  Sep 14, 2007 1:07 PM  Follow-up for Phone Call        spoke with pt. pt c/o increased sob and difficulty exhaling. offered pt appt with dr. Vassie Loll today at 2:50. pt declined as she is unable to get a ride until later today.  made pt appt with dr. Sherene Sires for tomorrow 09-15-07 @ 4:30pm.  Follow-up by: Cyndia Diver LPN,  Sep 14, 2007 2:42 PM

## 2010-05-23 NOTE — Assessment & Plan Note (Signed)
Summary: wheezing///kp   Visit Type:  Follow-up PCP:  [name] at [facility]  Chief Complaint:  fu visit......SOB......gurgling feeling in throat and chest..........Marland Kitchenreviewed meds.  History of Present Illness: Followup Visit. Last visit with me 06/01/2007 and then Dr. Sherene Sires in the interim. Issues  #Asthma - Moderate Persistent (fev1 63%) complicated by  Poor comprehension of medication regimen despite mulitple education attempts. Ran out of singulair that was prescribed in FEb 2009. Says singulair made no difference in symptoms. Is using symbicort 2 times a day but on as needed basis. 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 Did not switch to bystolic as recommended in feb 2009. Saw [facility] 3 months ago and reportedly cxr, ekg, stress test and echo were normal but still taking inderal  #Pulm Nodules - July 2009 shows stable micronodules. Needs next CT in 18 months  #Smking - still smoking. Unable to quit  #Psych - significant depression, anxiety, agarophobia complicating asthma management and comprehension levels  #Urinary Incontience - under control  #GERD - signficant wheeze and gurgling at night for 3 months. Prevacid prescribed by Dr. Sherene Sires in July 2009. reportedly helping a lot but still has symptoms.   #Headaches - being followed by Dr. Pearlean Brownie     Updated Prior Medication List: CLONAZEPAM ODT 1 MG  TBDP (CLONAZEPAM) three times a day LAMOTRIGINE 150 MG  TABS (LAMOTRIGINE) 1 by mouth daily at bedtime GEODON 40 MG  CAPS (ZIPRASIDONE HCL) 1 by mouth two times a day DOXYCYCLINE HYCLATE 20 MG  TABS (DOXYCYCLINE HYCLATE) 1 by mouth two times a day PREVACID 30 MG  CPDR (LANSOPRAZOLE) Take  one 30-60 min before first meal of the day SYMBICORT 160-4.5 MCG/ACT  AERO (BUDESONIDE-FORMOTEROL FUMARATE) Use 2 puffs two times a day PROPRANOLOL HCL 40 MG  TABS (PROPRANOLOL HCL) 1/2 tab two times a day METFORMIN HCL 500 MG  XR24H-TAB  (METFORMIN HCL) 1 tab two times a day * SULFAMETHOXAZOLE 1 tab by mouth two times a day PEXEVA 20 MG  TABS (PAROXETINE MESYLATE) once daily  Current Allergies (reviewed today): ! PCN  Past Medical History:    Reviewed history from 09/15/2007 and no changes required:       GERD       PULMONARY NODULE (ICD-518.89) see CT Jan 21, 2007 and Jyly 2009       DEPRESSION (ICD-311)       ASTHMA (ICD-493.90)       ANXIETY       HEADACHES       TACHYCARDIA NOS            Family History:    Reviewed history from 09/15/2007 and no changes required:       Asthma mother          Social History:    Reviewed history from 05/12/2007 and no changes required:       Patient is a current smoker.    Risk Factors: Tobacco use:  current    Year started:  2001    Cigarettes:  Yes -- 1 pack(s) per day   Review of Systems      See HPI   Vital Signs:  Patient Profile:   30 Years Old Female Height:     64 inches Weight:      215.38 pounds BMI:     37.10 O2 Sat:      98 % O2 treatment:    Room Air Temp:  97.8 degrees F oral Pulse rate:   89 / minute BP sitting:   126 / 90  (left arm) Cuff size:   regular  Pt. in pain?   no  Vitals Entered By: Clarise Cruz Duncan Dull) (November 30, 2007 2:18 PM)                  Physical Exam  General:     well developed, well nourished, in no acute distress Head:     normocephalic and atraumatic Eyes:     PERRLA/EOM intact; conjunctiva and sclera clear Ears:     TMs intact and clear with normal canals Nose:     no deformity, discharge, inflammation, or lesions Mouth:     Melampatti Class III.  Melampatti Class III.   Neck:     no masses, thyromegaly, or abnormal cervical nodes Chest Wall:     no deformities noted Lungs:     clear bilaterally to auscultation and percussion Heart:     regular rate and rhythm, S1, S2 without murmurs, rubs, gallops, or clicks Abdomen:     bowel sounds positive; abdomen soft and non-tender without  masses, or organomegaly Msk:     no deformity or scoliosis noted with normal posture Pulses:     pulses normal Extremities:     no clubbing, cyanosis, edema, or deformity noted Neurologic:     CN II-XII grossly intact with normal reflexes, coordination, muscle strength and tone Skin:     intact without lesions or rashes Cervical Nodes:     no significant adenopathy Psych:     alert and cooperative; normal mood and affect; normal attention span and concentration    Pulmonary Function Test Date: 11/30/2007 Height (in.): 64 Gender: Female  Pre-Spirometry FVC    Value: 2.67 L/min   Pred: 3.82 L/min     % Pred: 69 % FEV1    Value: 2.09 L     Pred: 3.26 L     % Pred: 63 % FEV1/FVC  Value: 78 %     Pred: 85 %     % Pred: 85 %  Evaluation: moderate obstruction with significant bronchodilator response    Impression & Recommendations:  Problem # 1:  ASTHMA (ICD-493.90) FEv1 is 2.09L and 63%. Is stable compared to [facility] 2009. Had no subjective relief with singulair. Uses symbicort 2 times daily on a rescue basis. Still on  beta blocker proprannol for tachycardia diagnosed at health dept in 2008. Cardilogy workup at [facility] cards negative per patient history.  PLAN Will stop inderal based on review of cardiology  eval (get outside notes) Stop singulair Again explained that symbicort is maintenance and she needs to take it 2 puff two times a day Hold off new intervention till I can get her to stop inderal Her updated medication list for this problem includes:    Symbicort 160-4.5 Mcg/act Aero (Budesonide-formoterol fumarate) ..... Use 2 puffs two times a day  Orders: Tobacco use cessation intermediate 3-10 minutes (99406) Spirometry (Pre & Post) [id number])   Problem # 2:  HEADACHE (ICD-784.0) Assessment: Improved  Her updated medication list for this problem includes:    Propranolol Hcl 40 Mg Tabs (Propranolol hcl) .Marland Kitchen... 1/2 tab two times a day   Problem # 3:  URINARY  INCONTINENCE (ICD-788.30) Assessment: Improved resolved  Problem # 4:  UNSPECIFIED TACHYCARDIA (ICD-785.0) Assessment: Unchanged Get [facility] eval notes and then decide on stopping inderal Orders: Tobacco use cessation intermediate 3-10 minutes (98119)   Problem #  5:  TOBACCO ABUSE (ICD-305.1) Assessment: Unchanged Still smoking 1 pack per day. Needs to quit. She knows the ill effects  but has depression and therefoire finds it tough to quit. Discussed chantix but she does not want to do it given suicidal side effect. In fact, it might be better for her psychiatrist to decide on medicine  PLAN 3 minutes face to face counselling Have asked her to address this with her psychiatrist Orders: Tobacco use cessation intermediate 3-10 minutes (81191) Radiology Referral (Radiology)   Problem # 6:  PULMONARY NODULE (ICD-518.89) Assessment: Unchanged stable nodule 01/2007 through 10/2007  PLAN next ct Oct - Dec 2010 Orders: Tobacco use cessation intermediate 3-10 minutes (47829) Radiology Referral (Radiology)   Problem # 7:  G E REFLUX (ICD-530.81) Assessment: Unchanged Prevacid is working but still with some night time gurgling and wheeze. ? asthma v VCD v GERD  PLAN Will decide after I stop inderal on how to address this  Her updated medication list for this problem includes:    Prevacid 30 Mg Cpdr (Lansoprazole) .Marland Kitchen... Take  one 30-60 min before first meal of the day   Medications Added to Medication List This Visit: 1)  Clonazepam Odt 1 Mg Tbdp (Clonazepam) .... Three times a day 2)  Lamotrigine 150 Mg Tabs (Lamotrigine) .Marland Kitchen.. 1 by mouth daily at bedtime 3)  Propranolol Hcl 40 Mg Tabs (Propranolol hcl) .... 1/2 tab two times a day 4)  Metformin Hcl 500 Mg Xr24h-tab (Metformin hcl) .Marland Kitchen.. 1 tab two times a day 5)  Sulfamethoxazole  .Marland Kitchen.. 1 tab by mouth two times a day 6)  Pexeva 20 Mg Tabs (Paroxetine mesylate) .... Once daily  Other Orders: Est. Patient Level III (56213)    Patient Instructions: 1)  Sign release for [place] to review [facility] Cardiology notes 2)  Next CT chest in Dec 2010 3)  Try to quit smoking 4)  Stop singulair 5)  Take symbicort 2puff twice daily 6)  I will call back about if you need to take your inderal or not 7)  See [name] Parret for med calendar  8)  STop hycodan and delsym 9)  Follow with me in 2 months   ]

## 2010-05-23 NOTE — Progress Notes (Signed)
Summary: cough  Phone Note Call from Patient Call back at Home Phone [phone]   Caller: Patient Call For: ramaswamy Reason for Call: Acute Illness, Talk to Nurse Summary of Call: pt coughing so much she can't control her bladder when she cough's .  Needs help w/cough  Cough med not helping [facility] [phone] chart reading to [name] P Initial call taken by: Eugene Gavia,  May 24, 2007 9:02 AM  Follow-up for Phone Call        Spoke with pt c/o coughing so heavy she loses control of her bladder.  Completed Levaquin 05/23/07.  Currently using symbicort inhaler and taking NS.  Currently on prednisone 5mg  once daily. Currently on Tussionex as well without relief. Made OV with TP today.  Pt also expressing concern about previous CXR which showed "spots" on her lungs, and f/u needed. Follow-up by: Cloyde Reams RN,  May 24, 2007 1:22 PM

## 2010-05-23 NOTE — Assessment & Plan Note (Signed)
Summary: increased sob/ramaswamy's pt/mg   Visit Type:  extended fu PCP:  [name] at [facility]  Chief Complaint:  Pt. of Dr. Jane Canary here today for acccute ov.  She c/o "attacks of sob" over the past 3 wks.  She states they can occur with or without rest.  She also has noticed some chest tightness also.  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  I had seen her previously concerned about the use of Inderal given the fact that she was felt to be asthmatic and strongly recommended switching over to Lopressor along with careful follow-up to make sure that she was treated aggressively for reflux because of her tendency to gag and cough when she loses her breath.  She's had increasing symptoms over the last 3 weeks of gagging coughing and not improved with lorazepam nor with her inhalers and has not been following anything that I recommended previously.  Specifically she is still on Inderal, although she does not know the dose, and not taking PPI as recommended on a regular basis  see medicine list below but note I am not confident at all but she takes any of these medications as they are prescribed.  She comes in today with 3 weeks of increasing dyspnea and cough although says she's better today and cannot really explain why in terms of any medication effect.  5/27 Meds:  LORAZEPAM 1 MG  TABS (LORAZEPAM) as needed LAMOTRIGINE 150 MG  TABS (LAMOTRIGINE) 1 by mouth daily GEODON 40 MG  CAPS (ZIPRASIDONE HCL) 1 by mouth two times a day DOXYCYCLINE HYCLATE 20 MG  TABS (DOXYCYCLINE HYCLATE) 1 by mouth two times a day PREVACID 30 MG  CPDR (LANSOPRAZOLE) Take  one 30-60 min before first meal of the day HYCODAN 5-1.5 MG/5ML  SYRP (HYDROCODONE-HOMATROPINE) 2 tsp every 6 h  TESSALON 200 MG  CAPS (BENZONATATE) 1 by mouth three times a day as needed cough DELSYM 30 MG/5ML  LQCR (DEXTROMETHORPHAN POLISTIREX) 2 tsp two times a day as needed cough       Current Allergies: ! PCN  Past Medical History:        PULMONARY NODULE (GMW-102.72) see CT Jan 21, 2007    DEPRESSION (ICD-311)    ASTHMA (ICD-493.90)         Family History:    Asthma mother       Social History:    Reviewed history from 05/12/2007 and no changes required:       Patient is a current smoker.     Review of Systems  The patient denies anorexia, fever, weight loss, weight gain, vision loss, decreased hearing, chest pain, syncope, peripheral edema, hemoptysis, abdominal pain, melena, hematochezia, severe indigestion/heartburn, hematuria, incontinence, muscle weakness, transient blindness, difficulty walking, depression, unusual weight change, abnormal bleeding, enlarged lymph nodes, and angioedema.     Vital Signs:  Patient Profile:   30 Years Old Female Height:     64 inches Weight:      216.50 pounds O2 Sat:      98 % O2 treatment:    Room Air Temp:     96.8 degrees F oral Pulse rate:   98 / minute BP sitting:   138 / 90  (left arm)  Vitals Entered By: Vernie Murders (  Sep 15, 2007 4:28 PM)                 Physical Exam  Mouth:     white exudate.   Additional Exam:       Obese disheveled anxious and depressed appearing white female in no acute distress with prominent pseudo-wheeze only Afeb with normal vital signs HEENT: nl dentition, turbinates, and orophanx. Nl external ear canals without cough reflex Neck without JVD/Nodes/TM Lungs clear to A and P bilaterally without cough on insp or exp maneuvers RRR no s3 or murmur or increase in P2 Abd soft and benign with nl excursion in the supine position. 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  I suppose she could also have asthma that exacerbated by the use of high-dose Inderal but they should not have resolved spontaneously to the point where she's perfectly clear on exam today.  This patient has no concept whatsoever of the difference between a maintenance in a p.r.n. medication.  Try the best I could to enlighten her on this topic today emphasizing that she should maintain on her PPI perfectly regularly, comply with the diet and lifestyle y modification is recommended in writing, and return here in two weeks for a full medication reconciliation in generation of medication calendar.  Once we're sure that we're all reading from the same page in terms of medication admiistration, she needs to be scheduled to follow up with me or Dr. Marchelle Gearing  parenthetically I would add that Ativan may not be an ideal choice for her.  My experience with patients with panic disorder and globus hystericus strongly suggests that the clonazepam is a better anxiolytic because it has a smoother effect with a lower ramp up as it is wearing off and less likely in this setting to precipitate panic. Orders: Est. Patient Level IV ([address])   Medications Added to Medication List This Visit: 1)  Lorazepam 1 Mg Tabs (Lorazepam) .... As needed   Patient Instructions: 1)  GERD (gastroesophageal reflux disease) was discussed. It is a common cause of respiratory symptoms. It commonly presents in the absence of heartburn. GERD can be treated with medication, but also with lifestyle changes including avoidance of late meals, excessive alcohol, smoking cessation, and avoidance of foods and drinks including those that the person notes to cause symptoms, fatty foods, chocolate, peppermint, colas, red wine, and acidic juices such as orange juice. NO MINT OR MENTHOL PRODUCTS 2)  Take  your prevacid daily 30-60 min before your first meal of the day perfectly regularly until you return 3)  Please schedule a follow-up appointment in 2 weeks to see [name]  with all your medications separated into    Prescriptions: PREVACID 30 MG  CPDR (LANSOPRAZOLE) Take  one 30-60 min before first meal of the day  #30 x 0   Entered by:   Vernie Murders   Authorized by:   Nyoka Cowden MD   Signed by:   Vernie Murders on 09/15/2007   Method used:   [facility] ...       [facility]  [address] [phone]*       [address]       [place], [place]  [address]       Ph: [phone] or [phone]       Fax: [phone]   RxID:   [id number] PREVACID 30 MG  CPDR (LANSOPRAZOLE) Take  one 30-60 min before first meal of the day  #0 x 0   Entered by:   Vernie Murders   Authorized by:   Nyoka Cowden MD   Signed by:   Vernie Murders on 09/15/2007   Method used:   Electronically sent to ...       [facility] [phone]*       [address]       [place], [place]  [address]       Ph: [phone] or [phone]       Fax: [phone]   RxID:   [id number]  ]

## 2010-05-23 NOTE — Letter (Signed)
Summary: [facility] Pul[facility]  [address]   [place], [place] [address]   Phone: [phone]  Fax: [phone]    09/28/2009  Emily [name] [address] [place], [place]  [address]  Dear [name],    We have attempted to contact you in regards to your appointment that was scheduled on 09-25-09. It is important that you come in for your appointments in order to allow your doctor to treat your medical needs. Please call our office to reschedule you appointment so we can give you the best care possible. Thank you.          Sincerely,   Nature conservation officer Pulmonary Division

## 2010-05-23 NOTE — Progress Notes (Signed)
Summary: pt wants DR. to call  Phone Note Call from Patient Call back at Home Phone [phone]   Caller: Patient Call For: Emily [name] Reason for Call: Talk to Doctor Summary of Call: wants dr. to call her.  Can't get in touch w/her ride.  Wants to know what you are going to do to help her?  She has a mental illness that prevents her from riding in car comfortable. Initial call taken by: Eugene Gavia,  January 13, 2008 2:29 PM  Follow-up for Phone Call        please advise  Philipp Deputy [facility]  January 13, 2008 3:05 PM   Additional Follow-up for Phone Call Additional follow up Details #1::        she is in office

## 2010-05-23 NOTE — Assessment & Plan Note (Signed)
Summary: ROV MBW   Visit Type:  Follow-up Primary Provider/Referring Provider:  [name] at [facility]  CC:  Followup.  Pt c/o increased sob since last seen here.  She states that she gets out of breath walking short distances such as from the parking lot to building this am.  She also c/o worsening cough that is prod with clear sputum.  States that she still loses continence and was seen by urology and told to followup here.Emily [name]  History of Present Illness: Followup Visit. Last visit with me 06/01/2007, Dr. Sherene Sires in interim, me 12/01/2007 and 01/13/2008. See PMHx for issues   OV 01/13/2008: inc cough, new green sputum and inc. wheeze. Still using symbicort fo rescue; use has gone from baseline 2puff daily to 5 times daily this past week (again emphasized this is maintenance).  FEv1 2.03L/60Reporting incontinence with cough (not seen gyn despite multiple prior advice). Still smoking. Stil using propranalol. Rec: doxy, 6d prednisone taper, gyn referral, and switch propranalol to bisoprolol.   OV 02/22/2008: Presens with pscyh home health aide #asthma - exposed to nephew with swine flu >10 days ago. Now with cough, green sputum, wheeze, dyspnea, all c/w asthma exacerbation. Taking albuterol and symbicort this week only. Prior that was non compliant. Stil does not realize symbicort is maintenance. Denies fever. Fev 1.86L/60%. #Ur incontinence - recurred due to asthma. Forgot to see GYN; #Smoking - unable to quit; #Med Non compliance - does not understand symbicort is maintenance, ran out of singulair, says is taking bisoprolol instead of inderal but does not have it in med list; #Tachycardia - reportedly taking inderal. REC: Change inderal to bisoprolo, refer gyn for urinary incontinence, Rx Ae-asthma with 6 day prednisone/symbicort/doxy  OV 08/25/2008: Acute visit for cough. Missed followup appt after last visit for acute asthma. . Again, poor historian. As best we can learn: cough x many months,  gagging a lot, clear vomitus, feels a strong sense of tickle in throat, strong sinus drainge, sneezes a lot when goes out, clear phlegm as well. Cough relieved by smoking menthol. Worsened early in morning but no other triggers. Also, wheezing. Denies active GERD. Says she is now taking symbicort 2 puff two times a day (This is the first time she has verbalized understanding that this maintenance drug) and changed inderal to bisoprolol. Stil  cough persists. #SMOKING: still smoking - unable to quit; #NON-COMPLIANCE - now compliant  Current Medications (verified): 1)  Clonazepam Odt 1 Mg  Tbdp (Clonazepam) .... Three Times A Day 2)  Lamotrigine 150 Mg  Tabs (Lamotrigine) .Emily [name].. 1 By Mouth Daily At Bedtime 3)  Geodon 40 Mg  Caps (Ziprasidone Hcl) .Emily [name].. 1 By Mouth Two Times A Day 4)  Prevacid 30 Mg  Cpdr (Lansoprazole) .... Take  One 30-60 Min Before First Meal of The Day 5)  Symbicort 160-4.5 Mcg/act  Aero (Budesonide-Formoterol Fumarate) .... Use 2 Puffs Two Times A Day 6)  Pexeva 20 Mg  Tabs (Paroxetine Mesylate) .... Once Daily 7)  Bisoprolol Fumarate 5 Mg Tabs (Bisoprolol Fumarate) .... Once Daily  Allergies (verified): 1)  ! Pcn  Past History:  Family History:    Asthma mother          (09/15/2007)  Social History:    Patient is a current smoker.      (05/12/2007)  Risk Factors:    Alcohol Use: N/A    >5 drinks/d w/in last 3 months: N/A    Caffeine Use: N/A    Diet: N/A  Exercise: N/A  Risk Factors:    Smoking Status: current (05/12/2007)    Packs/Day: 1 (05/12/2007)    Cigars/wk: N/A    Pipe Use/wk: N/A    Cans of tobacco/wk: N/A    Passive Smoke Exposure: N/A  Past Medical History:    GERD    PULMONARY NODULE (WUJ-811.91) see CT Jan 21, 2007 and Jyly 2009    DEPRESSION (ICD-311)    ASTHMA (ICD-493.90)    ANXIETY    HEADACHES    TACHYCARDIA NOS        #Moderate Persistent Asthma..............Emily KitchenRamaswamy-    -> Mild diffuse air trapping on CT 10/26/2007    -> - PFT  DATA     - 04/30/2007 (exacerbation): FEv1 1.56L/47%, FVC 2.1L/55%, Ratio 74 (87), Fef 25-75% 1.1L/29%    - fev1 63% in 05/2007      - 11/30/2007 (exacerbation): Fev1 2.09L/63%, Ratio 78 (85)     - 02/2008: Fev1 1.86L/86%    - 08/25/2008: Fev1 2.02L/65%, FVC 2/6L/71%, Ratio 78 (87), Fef 25-75% 1.8L/48%    -> Poor comprehension of medication regimen despite mulitple education attempts.         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 Did not switch to bystolic as recommended in feb 2009. Saw [facility] and reportedly cxr, ekg, stress test and echo were normal. Switched to bisoprolol on On 01/13/2008.        #Pulm Nodules - July 2009 shows stable micronodules. Needs next CT in 18 months; Dec 2010        #Smking -  Unable to quit        #Psych - significant depression, anxiety, agarophobia complicating asthma management         #Urinary Incontience - flares up with cough        #GERD -on prevacid        #Headaches - followed by Dr. Pearlean Brownie        Family History:    Reviewed history from 09/15/2007 and no changes required:       Asthma mother          Social History:    Reviewed history from 05/12/2007 and no changes required:       Patient is a current smoker.   Review of Systems      See HPI  Vital Signs:  Patient profile:   30 year old female Weight:      227.25 pounds BMI:     39.15 Temp:     97.5 degrees F oral Pulse rate:   91 / minute BP sitting:   122 / 78  (left arm)  Vitals Entered By: Vernie Murders (Aug 25, 2008 9:38 AM)  O2 Sat on room air at rest %:  99  Physical Exam  General:  well developed, well nourished, in no acute distressobese.   Head:  normocephalic and atraumatic Eyes:  PERRLA/EOM intact; conjunctiva and sclera clear Ears:  TMs intact and clear with normal canals Nose:  no deformity, discharge, inflammation, or lesions Mouth:  Melampatti Class III.  Poor dentition.  Neck:  no masses, thyromegaly, or abnormal cervical nodes Chest Wall:  no  deformities noted Lungs:  clear bilaterally to auscultation and percussion Heart:  regular rate and rhythm, S1, S2 without murmurs, rubs, gallops, or clicks Abdomen:  bowel sounds positive; abdomen soft and non-tender without masses, or organomegaly Msk:  no deformity or scoliosis noted with normal posture Pulses:  pulses normal Extremities:  no clubbing, cyanosis, edema, or  deformity noted Neurologic:  CN II-XII grossly intact with normal reflexes, coordination, muscle strength and tone Skin:  intact without lesions or rashes Cervical Nodes:  no significant adenopathy Psych:  alert and cooperative; normal mood and affect; normal attention span and concentration   -  Date:  08/25/2008    FVC: 2.59    FVC % EXPECT: 71    FEV1: 2.02    FEV1 % EXP: 65    FEF 25-75% 1.78    FEF %Expect 48   Pre-Spirometry FVC    Value: 2.59 L/min   % Pred: 71 % FEV1    Value: 2.02 L     % Pred: 65 % FEF 25-75  Value: 1.78 L/min   % Pred: 48 %  Impression & Recommendations:  Problem # 1:  COUGH (ICD-786.2) Assessment Deteriorated Not sure all her cough is from asthma. She is now reportedly compliant with symbicort. She has changed nonspecific betablocker Inderal to highly specific B1  blocker bisoprolol and her spiro is nearly the best spiro she has ever had. Therefore, I wonder if there are other driving factors for chronic cough. Her symptoms suggests Laryngopharyngeal Reflux (LPR) from sinus drainge or GERD but more likely the former.  PLAN start nasal steroids start chlorpheniramine reassess in 3-4 weeks (compliance with followup urged)  continue asthma meds  symbicort continue gerd med prevacide try to quit smoking if cough unresolved, wil lconsider laryngoscopy for VCD and endobronchial biopsies  Problem # 2:  UNSPECIFIED TACHYCARDIA (ICD-785.0) Assessment: Improved continue bisoprolol  Problem # 3:  TOBACCO ABUSE (ICD-305.1) Assessment: Unchanged spent 5 minutes counselling her to  quit and the importance of quitting in someone with ashtma and dangers to long term lung and cardiac health. SHe understands all this. Depression makes it tough for her to quit smoking. Depression also makes her a tough candidate to start chantix.  plan encourage to quit  Problem # 4:  PULMONARY NODULE (ICD-518.89) Assessment: Unchanged  last ct july 2009 next ct in 18 months; dec 2010  Problem # 5:  ASTHMA (ICD-493.90) Assessment: Unchanged Spiro near baseline. COugh probably unrelated to asthma  plan continue symbicort 2 piuff two times a day inhaler tecnhnique reemphasized (demonstrated good techniquq)  Medications Added to Medication List This Visit: 1)  Fluticasone Propionate 0.05 % Crea (Fluticasone propionate) .... 2 sprays into each nostril daily 2)  Chlorphen 4 Mg Tabs (Chlorpheniramine maleate) .... Take 2 tab at bedtime  Other Orders: Est. Patient Level II ([address]) HFA Instruction [phone]) Tobacco use cessation intermediate 3-10 minutes ([address])  Patient Instructions: 1)  continue your medicines 2)  you have learnt inhaler technique again from [place] 3)  take generic fluticasone 2 spray into each nostril two times a day 4)  take chlorpheniramine 2 tabs at bedtime - if this makes you too sleepy or dry cut down to 1tab at bedtime 5)  return to see me in 3-4 weeks before 09/27/2008 6)  quit smoking Prescriptions: CHLORPHEN 4 MG TABS (CHLORPHENIRAMINE MALEATE) take 2 tab at bedtime  #60 x 1   Entered and Authorized by:   Kalman Shan MD   Signed by:   Kalman Shan MD on 08/25/2008   Method used:   [facility]        [facility] [phone][facility] (retail)       [address]       [place], [place]  [address]  Ph: [phone] or [phone]       Fax: [phone]   RxID:   [id number] FLUTICASONE PROPIONATE 0.05 % CREA (FLUTICASONE PROPIONATE) 2 sprays into each nostril daily  #1 x 6   Entered and Authorized by:   Kalman Shan MD   Signed by:   Kalman Shan MD on 08/25/2008   Method used:   Electronically to        [facility] [phone]* (retail)       [address]       [place], [place]  [address]       Ph: [phone] or [phone]       Fax: [phone]   RxID:   [id number]

## 2010-05-23 NOTE — Progress Notes (Signed)
Summary: TIGHTNESS IN CHEST  Phone Note Call from Patient   Caller: Patient Call For: [facility] Summary of Call: TIGHTNESS IN HER CHEST NEED TO TALK WITH NURSE Initial call taken by: Rickard Patience,  Sep 16, 2007 3:58 PM  Follow-up for Phone Call        seen by Dr. Sherene Sires yesterday---not very pleased with visit---still having tightness in chest--scared she is going to have this breathing episode again...hard to take a deep breath....please advise. Follow-up by: Marijo File CMA,  Sep 16, 2007 4:11 PM  Additional Follow-up for Phone Call Additional follow up Details #1::        called. went to voicemail. left message to call back this am becuase after that I am off from office. Advised to go to er if dysnea is bad. OTherweise, I can talk on phone when I am available Kalman Shan MD  Sep 17, 2007 9:14 AM  Additional Follow-up by: Kalman Shan MD,  Sep 17, 2007 9:14 AM         Appended Document: TIGHTNESS IN CHEST Says dyspneic and chest tightness and wheezing for 1-2 weeks. Called ambulance and was treated at home for low O2 a week ago. Saw Dr. Sherene Sires on 09/15/2007. Was told to see [name] Parrett apparently. Was not started on prednisone and she is reluctant to take it for long time. Unclear what degree of exacerbation. Taking symbicort 2-3 times a day and pro-air as needed (needing it 2times yestersday).  After discussion, agreed on  a. 5 day prednsione - empiric on phone vfor asthma exacerbation b. keep up tammy parrrett appt for propranaol swtich to beta1 slective beta blocker c. symbicort 2 puff two times a day d. pro-air as needed e. get wporse, go to er  Appended Document: TIGHTNESS IN CHEST        Current Allergies: ! PCN          Medications Added to Medication List This Visit: 1)  Prednisone 10 Mg Tabs (Prednisone) .... Take 4 tablets daily x1 days, then 3 tablets daily x 1 days, then 2 tablets daily x1 days, then 1 tablet daily x2 days, then stop    Prescriptions: PREDNISONE 10 MG  TABS (PREDNISONE) Take 4 tablets daily x1 days, then 3 tablets daily x 1 days, then 2 tablets daily x1 days, then 1 tablet daily x2 days, then stop  #11 x 1   Entered and Authorized by:   Kalman Shan MD   Signed by:   Kalman Shan MD on 09/17/2007   Method used:   Electronically sent to ...       [facility] [phone]*       [address]       [place], [place]  [address]       Ph: [phone] or [phone]       Fax: [phone]   RxID:   [id number]  ]

## 2010-05-23 NOTE — Progress Notes (Signed)
Summary: NEED REFERRAL  Phone Note Call from Patient   Caller: Patient Call For: Aikam Vinje Complaint: Cough/Sore throat Summary of Call: PT STILL HAVING HEADACHES NEED REFERRAL TO SEE SOMEONE Initial call taken by: Rickard Patience,  October 21, 2007 11:37 AM  Follow-up for Phone Call        Spoke with pt.  She c/o frequesnt ha's and would like to be referred to a ha specialist.  I advised that her pcp would need to do this for her, rather than pulmonologist.  I dont see where she has ever c/o this before or been txed for ha here in our office.  She states that her medicaid "ran out" and she can not go back to her pcp due to cost.  I advised you were no back in the office until 11-01-07 and we would get back to her about a possible referral asap.  Please advise if you are willing to refer.  Thanks! Follow-up by: Vernie Murders,  October 21, 2007 11:54 AM  Additional Follow-up for Phone Call Additional follow up Details #1::        I am not sure who will see her if she is uninsured. WE can try [facility] or [facility]. I will make referrals to both. Please let her know that. Please tell her to reapply for medicaid Kalman Shan MD  October 27, 2007 10:40 AM  Additional Follow-up by: Kalman Shan MD,  October 27, 2007 10:40 AM  New Problems: HEADACHE (ICD-784.0)   Additional Follow-up for Phone Call Additional follow up Details #2::    Pt aware referral is in the system and we will call her with appt. Order in [facility] box. Follow-up by: Reynaldo Minium CMA,  October 27, 2007 11:00 AM  New Problems: HEADACHE (ICD-784.0)

## 2010-05-23 NOTE — Progress Notes (Signed)
Summary: nos appt  Phone Note Call from Patient   Caller: [email or web address]  Call For: Kaiya Boatman Summary of Call: [facility] x2 to [facility] nos from 6/30. Initial call taken by: Darletta Moll,  October 19, 2009 3:08 PM

## 2010-05-23 NOTE — Progress Notes (Signed)
Summary: PT'S MED  IS MAKING HER SICK  Phone Note Call from Patient Call back at Home Phone [phone]   Caller: Patient Call For: Coreen Shippee Summary of Call: PT NEW MED FOR POST NASAL DRIP IS MAKING HER VOMIT Initial call taken by: Lacinda Axon,  Sep 01, 2008 2:49 PM  Follow-up for Phone Call        pt stated that the fluticasone is making her vomit---she has only used this once and it made her vomit immediately after using---will not use that med again---still has the dry cough---causing her to have headaches---meds not helping--she has been coughing until she vomits as well..not sure of what else to do.  please advise. Marijo File CMA  Sep 01, 2008 3:10 PM   Additional Follow-up for Phone Call Additional follow up Details #1::        had she taken her chlorpheniramine before vomitting? Fluticasone will not make her vomit but is conceivabel that chlorphen might have   [id number].  Aundra Millet Reynolds LPN  Sep 05, 2008 2:02 PM   ATC pt back to touch base with her regarding this message since this is an old message.  LM for pt to call back if she is still having problems or has any questions. Aundra Millet Reynolds LPN  Sep 12, 2008 12:12 PM  Additional Follow-up by: Kalman Shan MD,  Sep 05, 2008 1:39 PM

## 2010-06-20 ENCOUNTER — Ambulatory Visit (INDEPENDENT_AMBULATORY_CARE_PROVIDER_SITE_OTHER): Payer: Self-pay | Admitting: Internal Medicine

## 2010-07-18 ENCOUNTER — Ambulatory Visit (INDEPENDENT_AMBULATORY_CARE_PROVIDER_SITE_OTHER): Payer: Self-pay | Admitting: Internal Medicine

## 2010-09-03 NOTE — H&P (Signed)
NAME:  Emily [name], Emily [name]             ACCOUNT NO.:  [id number]   MEDICAL RECORD NO.:  [id number]          PATIENT TYPE:  AMB   LOCATION:  DAY                           FACILITY:  [facility]   PHYSICIAN:  R. Roetta Sessions, M.D. DATE OF BIRTH:  12-25-1980   DATE OF ADMISSION:  DATE OF DISCHARGE:  LH                              HISTORY & PHYSICAL   CHIEF COMPLAINT:  Nausea and vomiting, elevated white count.   PRIMARY CARE PHYSICIAN:  Cannon Kettle, Nurse Practitioner at  [facility]   HISTORY OF PRESENT ILLNESS:  [name] is a 30 year old Caucasian female  who presents with a one month history of abdominal pain, nausea,  vomiting and gagging.  She states the symptoms began about a month ago.  Initially, she went to the emergency department at [facility].  She also had chest pain.  She had abdominal ultrasound which  revealed an echogenic enlarged liver, most compatible with fatty  infiltration.  No liver was mildly enlarged, so the gallbladder was  incompletely contracted, therefore was not optimally evaluated.  No  obvious cholecystitis.  No bile duct dilatation.  No gallstones seen.  Her lipase was normal that day.  Her ALT was slightly elevated at 37  along with slightly elevated alkaline phosphatase of 134.  Her white  count was 14,800.  Urinalysis was negative.  She had a chest x-ray which  revealed slight bibasilar atelectasis.  She did have a UTI on December 25, 2006 with Klebsiella pneumonia that has been successfully treated  with Macrobid.  That day, she also had a white count of 14,600.  She  reportedly has had a negative hepatobiliary scan.  On November 24, 2006,  her white count again was elevated at 14,200.  Her hemoglobin was 15.2.  Acute hepatitis panel was negative for hepatitis A, B and C.  Her  alkaline phosphatase was up at 148 and ALT was up at 42.  The patient  has been treated with Macrobid for UTI.  She also took a Z-Pak in  August.  She  states she feels lousy.  She is laying around most of the  time.  She states her father has witnessed her dry heaving while she was  asleep.  Patient has had multiple episodes of nausea and vomiting  associated with right upper quadrant abdominal pain.  Her symptoms are  not necessarily related to meals, however.  Her bowel movements are  regular.  No melena or rectal bleeding.  Last menstrual cycle was  January 09, 2007  Her menstrual cycles were regular.  She denies any  dysuria, hematuria, fever or chills.   CURRENT MEDICATIONS:  1. Clonazepam 1 mg daily.  2. Lamictal 150 mg daily.  3. Geodon 80 mg daily.  4. Pexeva 40 mg daily.  5. Propranolol half tablet b.i.d.  6. Doxycycline.   ALLERGIES:  PENICILLIN.   PAST MEDICAL HISTORY:  1. Obesity.  2. Bipolar disorder.  3. Anxiety.  4. Agoraphobia.  5. History of self-mutilation.  6. Tachycardia.  7. Asthma.  8. Acne.   FAMILY HISTORY:  Mother  is 51, has history of sarcoidosis and  hypertension.  Father is 30, has had a coronary artery bypass graft.  She has a 67 year old sister and a 34-year brother who are both healthy.  No family history of colorectal cancer, peptic ulcer disease.   SOCIAL HISTORY:  She is single.  She is unemployed.  She finished the  9th grade.  She states that she did not leave her house for over 3 years  because of severe agoraphobia.  She has improved and is followed by a  psychiatrist.  She smokes one pack cigarettes daily.  She has smoked  since the age of 44.  She consumes 3-4 beers weekly.  No history of drug  use.   REVIEW OF SYSTEMS:  See HPI for GI.  CONSTITUTIONAL.  She states she has  gained unspecified amount of weight over the past one year since she  started a lot of her psychiatric medications.  She has gained 7 pounds  since the September 5 when she was in Jan Daniels' office.  CARDIOPULMONARY:  History of tachycardia, well controlled with  propranolol.  No chest pain, palpitations,  cough.  She does have  intermittent shortness of breath, but does not use her asthma inhaler  regularly.  GENITOURINARY:  See HPI.  She states she has had a recent  pelvic exam which was normal.   PHYSICAL EXAMINATION:  VITAL SIGNS:  Weight 219.5, height 5 feet 3  inches, temperature 98, blood pressure 120/88, pulse 64.  GENERAL:  Pleasant, obese, Caucasian female in no acute distress.  SKIN: Warm and dry.  No jaundice.  HEENT: Sclerae nonicteric.  Oropharyngeal mucosa moist and pink.  No  lesions, erythema or exudate.  No lymphadenopathy, thyromegaly.  CHEST: Lungs with auscultation.  CARDIAC:  Exam reveals regular rate and rhythm.  Normal S1-S2.  No  murmurs, rubs or gallops.  ABDOMEN:  Positive bowel sounds.  Obese but symmetrical.  Soft.  She has  some moderate tenderness throughout the epigastrium right upper quadrant  region.  She has some tenderness in the right lower quadrant, but no  rebound tenderness or guarding.  No hepatosplenomegaly or masses  appreciated.  No abdominal bruits or hernias.  EXTREMITIES: No edema.   IMPRESSION:  [name] is a 30 year old lady with one month history of  intermittent nausea, vomiting and epigastric/right upper quadrant  abdominal pain.  Her symptoms are not brought on by meals.  She has had  a normal HIDA scan per her report.  She had abdominal ultrasound as  outlined above.  At this point in time, her symptoms do not appear to be  biliary.  I suspect her mildly elevated ALT is secondary to fatty liver.  She had leukocytosis of undetermined etiology.  Given her ongoing  abdominal pain, vomiting and leukocytosis, would recommend CT of the  abdomen and pelvis.  She had some mild right lower quadrant tenderness  on exam today.  We need to consider the possibility of chronic  appendicitis.   PLAN:  1. CT of the abdomen and pelvis ASAP.  2. If CT is unremarkable, would proceed with EGD as the next step      which I have discussed this with the  patient.      Tana Coast, P.AJonathon Bellows, M.D.  Electronically Signed    LL/MEDQ  D:  01/12/2007  T:  01/12/2007  Job:  [id number]   cc:   [facility] FAX:  [phone] Cannon Kettle, NP

## 2010-09-03 NOTE — Letter (Signed)
February 03, 2007    Emily Matte, FNP  [facility]  [address]  [place], [place]  [address]   RE:  Emily, [name]  MRN:  [id number]  /  DOB:  02-27-1981   Dear Emily [name]:   It was a pleasure to see Emily [name], a 30 year old patient, in the  Pulmonary Clinic for evaluation of cough, chest pain gagging and  nodules. She has had a dry cough for many months and it comes on any  time but occasionally when she takes deep breaths as well. For this  past one month, she has been plagued by some dyspnea and chest pain.  Describes the chest pain as being central and also present on the left  side of the chest. It is particularly worse when she lies down on the  left side and can last as long as she continues to lie down on the left  side. It is decreased by turning. Additionally, the properties are that  it can come on-and-off at any time randomly. Dyspnea is increased with  walking and exertion and decreased with lying down. There are no  specific trigger factors. The other complaint she has is that she has  been gagging when smoking for the past one month. Based on this history,  you performed a chest x-ray and subsequently a CT scan, which apparently  shows two nodules and therefore she has been referred here. She denies  wheezing, fevers, upper respiratory infection, hemoptysis, weight loss.   PAST MEDICAL HISTORY:  1. Severe depression. She states that she is currently on many      medications and had tremendous weight gain because of the anti-      depressants. Prior to that she was so depressed, that she did not      leave her house for four years. Currently, things are much better.   1. Asthma. She says this was diagnosed many years ago by Dr.  Prudy [name]. She was age 71 at the time. She has been asymptomatic in      terms of asthma until current symptoms.   MEDICATION ALLERGIES:  PENICILLIN. She does not know the specifics  reaction, but she says  that her mom told her.   MEDICATIONS:  1. Clonazepam.  2. unknown med.  3. Propranolol.  4. Lamotrigine.  5. Geodon.  6. Doxycycline 20 mg b.i.d.   SOCIAL HISTORY:  She smokes a pack a day for the past 8 years. She  drinks alcohol once a week. She is single, is not married. She lives  with her parents.   FAMILY HISTORY:  Maternal grandmother had emphysema. Mother had  allergies and asthma.   REVIEW OF SYSTEMS:  This is documented in the questionnaire and is  positive for shortness of breath, activity and depression, dry cough,  chest pain, acid burn, indigestion, sore throat, headaches, sneezing and  weight gain from 150 to 218 pounds this past year.   PHYSICAL EXAMINATION:  VITAL SIGNS: Weight 218.6 pounds, temperature  96.9, blood pressure 114/82, pulse 99, saturation is 96% on room air.  GENERAL: Morbidly obese, seated comfortably.  HEENT: Acne on face. Mallampati class 4. Poor dentition.  RESPIRATORY: Clear to auscultation.  CARDIOVASCULAR: Normal heart sounds.  ABDOMEN: Soft, obese, abdominal striae present.  EXTREMITIES: No cyanosis, no clubbing or edema.  SKIN: Is intact but with acne on face.   LABORATORY DATA:  CT scan was done on January 21, 2007.  This was done at  [facility] in [place]. I do not have the actual films with me,  but was given a computer generated paper printed photo print sheet. It  was extremely difficult to look at these films and to make any  interpretation. The official reading by Dr.  Lewis [name] was presence of  a 6-mm nodule on image 124 and a 4-mm nodule in image 136, both of the  right middle lobe. I think I agree with these findings.   ASSESSMENT/PLAN:  1. Pulmonary nodules. Emily [name] is a 58 -year-old smoker with 4-      6 mm nodules on a CT scan of the chest. Even though she is a      smoker, she is only 26 and therefore her risk for lung cancer is      small. Based on Fleishner criteria, she should have followup CT       scan in 9 months. Of note, I disagree with the radiologist      recommendation of repeating CT in 3 months. I later learned from      our medical assistant, Emily [name], that the patient called      back, was upset and rescheduled her CT scan for three months. After      couple of attempts (including one from me), Emily [name] was able      to get hold of patient and explained rationale for a 9 month CT.      Emily [name] has accepted this plan.   1. Shortness of breath. Will get pulmonary function tests and I will      see her in clinic.   1. Atypical chest pain. I do not know what the cause of this chest      pain is. Will look at her PFTs and then decide.    Sincerely,      Emily Shan, MD  Electronically Signed    MR/MedQ  DD: 02/08/2007  DT: 02/08/2007  Job #: [id number]

## 2010-09-06 NOTE — Discharge Summary (Signed)
Emily [name], Emily [name]             ACCOUNT NO.:  [id number]   MEDICAL RECORD NO.:  [id number]          PATIENT TYPE:  IPS   LOCATION:  0404                          FACILITY:  [facility]   PHYSICIAN:  Jasmine Pang, M.D. DATE OF BIRTH:  [birth year]   DATE OF ADMISSION:  01/02/2006  DATE OF DISCHARGE:  01/03/2006                                 DISCHARGE SUMMARY   IDENTIFYING INFORMATION:  A 30 year old single Caucasian female who was  admitted on an involuntary basis.   HISTORY OF PRESENT ILLNESS:  This 30 year old single Caucasian female was  admitted due to cutting on herself with a razor blade. She is followed on an  outpatient basis at [facility] for depression.  She is  known to self mutilate.  She has a history of agoraphobia and in the past 4-  8 months has been doing very well with current medication.  She recently  learned that her father had 2 aneurysms and a heart problems.  She became  very upset.  She stated that she could not cope. She decided to release her  stress by inflicting superficial scratches on her left upper arm and on the  inside of her right knee.  She used a razor blade, and so hospitalization  was indicated. She has also gained 40 pounds in the past 2 months.  She  lives with her boyfriend and does not work.  The patient has no inpatient  treatment.  She has been treated on outpatient basis here by Jorje Guild, PA,  for the past 8-9 months.  The patient denies any drug or alcohol abuse.  She  is on Lamictal 150 mg daily, amitriptyline 10 mg nightly and Klonopin 1 mg  p.o. b.i.d. as well as Invega 3 mg p.o. daily.  She is allergic to  PENICILLIN.   For further admission information, see psychiatric admission assessment.   PHYSICAL EXAMINATION:  The patient was an obese, well-developed, well-  nourished Caucasian female who appeared her stated age of 43.  She was 5  feet 3 inches, weight 182 pounds, temperature 97.4, blood pressure 116/71,  pulse 84, respirations 16. She had no acute medical problems.   ADMISSION LABORATORIES:  Urine pregnancy test was negative. Other  laboratories were done at the [facility] ED prior to admission.  These were evaluated by the ED physician, and there were no acute problems.   HOSPITAL COURSE:  Upon admission here, the patient was restarted on her  Lamictal 150 mg p.o. daily, multivitamins one daily, amitriptyline 10 mg  p.o. nightly, clonazepam 1 mg p.o. b.i.d., fish oil 300 mg 1 p.o. daily,  pexseva 40 mg 1 p.o. daily.  She was also started on trazodone 50 mg p.o.  nightly p.r.n. insomnia.  On January 02, 2006, the patient was also  started on Invega 3 mg p.o. now and daily.  The patient discussed her  stressors at home.  Her father had 2 aneurysms in the brain.  She lives with  her boyfriend.  She says he is very supportive. Her mental status had  improved markedly.  She was  much less depressed and anxious.  Affect was  wide range.  There was no suicidal or homicidal ideation.  There was no  ideation for self injurious behavior.  She stated she would not be hurting  herself if discharged.  There were no auditory or visual hallucinations.  No  paranoia or delusions.  Thoughts were logical and goal-directed.  Thought  content: No predominant theme except wanting discharge.  Cognitive exam was  grossly within normal limits, back to baseline.  The patient stated she  would be safe if discharged. She wanted to go home.  She had a very  supportive family and boyfriend. It was decided she could be safely  discharged.   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  .  2. Anxiety disorder not otherwise specified.  AXIS II: None.  AXIS III:  1. Asthma.  2. Yeast infection, current.  AXIS IV: Moderate.  AXIS V: GAF upon discharge was 45. GAF upon admission 30.  GAF highest past  year 65-70.   DISCHARGE INSTRUCTIONS:  No specific activity level or dietary  restrictions.   DISCHARGE MEDICATIONS:  1. Lamotrigine 150 mg daily.  2. Clonazepam 1 mg p.o. b.i.d.  3. Omega-3 acid, take as directed.  4. Paxil 40 mg daily.  5. Amitriptyline 10 mg 1 p.o. nightly.  6. Invega 3 mg daily.   POST HOSPITAL CARE PLANS:  The patient was going to be called by our case  manager on Monday (since it was the weekend) with a followup appointment.  She sees Jorje Guild, [place], at [facility] for her  medication management.      Jasmine Pang, M.D.  Electronically Signed     BHS/MEDQ  D:  01/18/2006  T:  01/19/2006  Job:  [id number]

## 2010-09-06 NOTE — H&P (Signed)
Emily [name], Emily [name]             ACCOUNT NO.:  [id number]   MEDICAL RECORD NO.:  [id number]          PATIENT TYPE:  IPS   LOCATION:  0404                          FACILITY[facility]  [facility]   PHYSICIAN:  Geoffery Lyons, M.D.      DATE OF BIRTH:  07/30/1980   DATE OF ADMISSION:  01/02/2006  DATE OF DISCHARGE:                         PSYCHIATRIC ADMISSION ASSESSMENT   This is an involuntary admission to the services of Dr. Geoffery Lyons.   IDENTIFYING INFORMATION:  This is a 30 year old single white female.  Apparently, she is followed on an outpatient basis here at [facility] for depression.  She is known to self-mutilate.  She has a  history for agoraphobia and in the past 4-8 months, she has been doing very  well with current medication; however, she recently learned that her father  has two aneurysms and a heart problem.  She became very upset.  She stated  that she could not cope.  She decided to release her stress by inflicting a  superficial scratch, really, on her left upper arm and on the inside of her  right knee.  As she used a razor blade, it was felt that hospitalization was  indicated.  She has also gained 40 pounds in the last two months.  She lives  with her boyfriend.  She does not work.   PAST PSYCHIATRIC HISTORY:  She has had no inpatient treatment.  She has been  treated on an outpatient basis here by Rae Halsted, P.A., for the past 8-9  months.   SOCIAL HISTORY:  She went to the 9th grade.  She is not working right now.  She likes to be at home.  She states that she hung out with the wrong crowd,  and she is hoping to finish her GED.   FAMILY HISTORY:  Maternal grandmother took Paxil.   She smokes one pack a day.  No alcohol or drugs.   PRIMARY CARE Mishel Sans:  Does not have one.   MEDICAL PROBLEMS:  She is known to have asthma.  She was identified as  having a yeast infection the other day and need to take her medication.  She  states that it is a one  time dose, and it is out in her pocketbook.   CURRENT MEDICATIONS:  1. Lamictal 150 mg p.o. daily.  2. Multivitamin 1 p.o. daily.  3. Amitriptyline 10 mg nightly.  4. Klonopin 1 mg b.i.d.  5. Fish oil 300 mg 1 daily.  6. Pexeva 40 mg one every day.  7. Invega 3 mg 1 p.o. daily.   DRUG ALLERGIES:  PENICILLIN.   POSITIVE PHYSICAL FINDINGS:  GENERAL:  Besides being obese, she is a well-  developed and well-nourished white female who appears her stated age of 59.  VITAL SIGNS:  She is 5 feet 3.  She weighs 182 pounds.  Temperature 97.4,  blood pressure 116/71, pulse 84, respirations 16.   Her labs were done at [facility], where she was seen initially.  She had no  remarkable physical findings.  Her UDS was negative.  She had trace  leukocyte esterase.  She had no blood alcohol level.   MENTAL STATUS EXAM:  She is alert and oriented x3.  She is casually groomed  and dressed.  Adequately nourished.  Her speech is normal in rate, rhythm,  and tone.  Her mood is depressed and anxious.  Affect is congruent.  Thought  processes are coherent, goal-oriented.  Judgment, insight are poor.  Concentration and memory are intact.  Intelligence is average to borderline.  She denies suicidal or homicidal ideations.  She denies auditory or visual  hallucinations.   DIAGNOSIS:   AXIS I:  Social anxiety disorder and depression with a history for  agoraphobia.   AXIS II:  1. Rule out personality disorder.  2. History for self-mutilation.   AXIS III:  1. Asthma.  2. Yeast infection, current.   AXIS IV:  Moderate.   AXIS V:  30.   The plan is to admit for safety and stabilization, to adjust her meds if  indicated.  She states that her meds have actually been working quite well,  that she just needs to learn better coping mechanism for when she has stress  moods.  She does have a therapist.  She is to continue with [place] from  [facility].      Mickie Leonarda Salon,  P.A.-C.      Geoffery Lyons, M.D.  Electronically Signed    MD/MEDQ  D:  01/02/2006  T:  01/02/2006  Job:  [id number]

## 2011-01-22 ENCOUNTER — Telehealth: Payer: Self-pay | Admitting: Internal Medicine

## 2011-01-22 ENCOUNTER — Encounter: Payer: Self-pay | Admitting: Adult Health

## 2011-01-22 LAB — URINALYSIS, ROUTINE W REFLEX MICROSCOPIC
Bilirubin Urine: NEGATIVE
Glucose, UA: NEGATIVE
Hgb urine dipstick: NEGATIVE
Ketones, ur: NEGATIVE
Nitrite: NEGATIVE
Protein, ur: NEGATIVE
Specific Gravity, Urine: 1.01
Urobilinogen, UA: 0.2
pH: 5.5

## 2011-01-22 LAB — WET PREP, GENITAL
Trich, Wet Prep: NONE SEEN
Yeast Wet Prep HPF POC: NONE SEEN

## 2011-01-22 LAB — URINE MICROSCOPIC-ADD ON

## 2011-01-22 LAB — GC/CHLAMYDIA PROBE AMP, GENITAL
Chlamydia, DNA Probe: NEGATIVE
GC Probe Amp, Genital: NEGATIVE

## 2011-01-22 LAB — POCT PREGNANCY, URINE: Preg Test, Ur: NEGATIVE

## 2011-01-22 NOTE — Telephone Encounter (Signed)
Spoke with pt. She states needs order for neb machine, was seen in the ED a few times since last visit in 2010 and was started on nebs, ? Name at this time. Her machine is broken now. She states has been having some increased SOB recently. I have sched her to see TP tomorrow at 11:30 am and advised call sooner or seek emergency care in the meantime if needed. Pt verbalized understanding.

## 2011-01-23 ENCOUNTER — Ambulatory Visit: Payer: Self-pay | Admitting: Adult Health

## 2011-02-03 LAB — HEPATIC FUNCTION PANEL
ALT: 37 — ABNORMAL HIGH
AST: 31
Albumin: 3 — ABNORMAL LOW
Alkaline Phosphatase: 134 — ABNORMAL HIGH
Bilirubin, Direct: 0.2
Indirect Bilirubin: 0.2 — ABNORMAL LOW
Total Bilirubin: 0.4
Total Protein: 6.5

## 2011-02-03 LAB — LIPASE, BLOOD: Lipase: 36

## 2011-02-03 LAB — CBC
HCT: 40.9
Hemoglobin: 14.2
MCHC: 34.6
MCV: 90.8
Platelets: 279
RBC: 4.51
RDW: 17.4 — ABNORMAL HIGH
WBC: 14.8 — ABNORMAL HIGH

## 2011-02-03 LAB — URINALYSIS, ROUTINE W REFLEX MICROSCOPIC
Bilirubin Urine: NEGATIVE
Glucose, UA: NEGATIVE
Hgb urine dipstick: NEGATIVE
Ketones, ur: NEGATIVE
Nitrite: NEGATIVE
Protein, ur: NEGATIVE
Specific Gravity, Urine: 1.007
Urobilinogen, UA: 0.2
pH: 6.5

## 2011-02-03 LAB — DIFFERENTIAL
Basophils Absolute: 0.2 — ABNORMAL HIGH
Basophils Relative: 1
Eosinophils Absolute: 0.3
Eosinophils Relative: 2
Lymphocytes Relative: 39
Lymphs Abs: 5.8 — ABNORMAL HIGH
Monocytes Absolute: 0.8 — ABNORMAL HIGH
Monocytes Relative: 5
Neutro Abs: 7.8 — ABNORMAL HIGH
Neutrophils Relative %: 53

## 2011-02-03 LAB — POCT PREGNANCY, URINE
Operator id: 29008
Preg Test, Ur: NEGATIVE

## 2011-02-06 LAB — URINE MICROSCOPIC-ADD ON

## 2011-02-06 LAB — BASIC METABOLIC PANEL
BUN: 7
CO2: 26
Calcium: 9
Chloride: 103
Creatinine, Ser: 0.76
GFR calc Af Amer: >60
GFR calc non Af Amer: >60
Glucose, Bld: 95
Potassium: 4.4
Sodium: 136

## 2011-02-06 LAB — URINALYSIS, ROUTINE W REFLEX MICROSCOPIC
Bilirubin Urine: NEGATIVE
Glucose, UA: NEGATIVE
Hgb urine dipstick: NEGATIVE
Ketones, ur: NEGATIVE
Nitrite: NEGATIVE
Protein, ur: NEGATIVE
Specific Gravity, Urine: 1.011
Urobilinogen, UA: 0.2
pH: 6.5

## 2011-02-06 LAB — DIFFERENTIAL
Basophils Absolute: 0
Basophils Relative: 0
Eosinophils Absolute: 0.4
Eosinophils Relative: 4
Lymphocytes Relative: 39
Lymphs Abs: 4.4 — ABNORMAL HIGH
Monocytes Absolute: 0.7
Monocytes Relative: 6
Neutro Abs: 5.7
Neutrophils Relative %: 51

## 2011-02-06 LAB — PREGNANCY, URINE: Preg Test, Ur: NEGATIVE

## 2011-02-06 LAB — CBC
HCT: 41.1
Hemoglobin: 13.8
MCHC: 33.6
MCV: 87.5
Platelets: 291
RBC: 4.7
RDW: 14.2 — ABNORMAL HIGH
WBC: 11.3 — ABNORMAL HIGH

## 2011-09-25 ENCOUNTER — Encounter (INDEPENDENT_AMBULATORY_CARE_PROVIDER_SITE_OTHER): Payer: Self-pay | Admitting: *Deleted

## 2011-09-30 ENCOUNTER — Ambulatory Visit (INDEPENDENT_AMBULATORY_CARE_PROVIDER_SITE_OTHER): Payer: Medicaid Other | Admitting: Internal Medicine

## 2011-09-30 ENCOUNTER — Encounter (INDEPENDENT_AMBULATORY_CARE_PROVIDER_SITE_OTHER): Payer: Self-pay | Admitting: Internal Medicine

## 2011-09-30 VITALS — BP 118/74 | HR 80 | Temp 98.3°F | Ht 64.0 in | Wt 239.4 lb

## 2011-09-30 DIAGNOSIS — K760 Fatty (change of) liver, not elsewhere classified: Secondary | ICD-10-CM | POA: Insufficient documentation

## 2011-09-30 DIAGNOSIS — K219 Gastro-esophageal reflux disease without esophagitis: Secondary | ICD-10-CM

## 2011-09-30 DIAGNOSIS — R933 Abnormal findings on diagnostic imaging of other parts of digestive tract: Secondary | ICD-10-CM

## 2011-09-30 DIAGNOSIS — K7689 Other specified diseases of liver: Secondary | ICD-10-CM

## 2011-09-30 DIAGNOSIS — R932 Abnormal findings on diagnostic imaging of liver and biliary tract: Secondary | ICD-10-CM | POA: Insufficient documentation

## 2011-09-30 MED ORDER — PANTOPRAZOLE SODIUM 40 MG PO TBEC: 40.0000 mg | DELAYED_RELEASE_TABLET | Freq: Every day | ORAL | Status: DC

## 2011-09-30 MED ORDER — POLYETHYLENE GLYCOL 3350 17 GM/SCOOP PO POWD: 17.0000 g | Freq: Every day | ORAL | Status: AC

## 2011-09-30 NOTE — Patient Instructions (Signed)
H. Pylori, CT abdomen and pelvis with CM. Protonix 40mg  po daily, Miralax 1 scoop daily.

## 2011-09-30 NOTE — Progress Notes (Addendum)
Subjective:     Patient ID: Emily [name], female   DOB: 1980/05/26, 31 y.o.   MRN: [id number]  Snipe is a 31 yr old female referred to our office by Dr. Loney Hering for nausea and constipation. She tells me she had diarrhea 2 weeks ago which lasted about a week. Now she says she is constipated. She tells me her  Lasix was stopped 2 weeks because of the diarrhea.  Her last BM was yesterday and was hard. She usually has a BM daily. She is not taking any new medications.  Constipation for about a week. Appetite is not good. No weight loss.  Stools are a light brown in color and sometimes it looks like coffee.  She has acid reflux daily. She admits to drinking a lot of sodas.  She tells me the Prevacid did not help. She also has a hx of fatty liver disease and her transaminases have been elevated in the past.  I will get her records from [place].   09/19/2011 ALP 94, ALT 77, AST 63, H and H 14.7 and 44.8, Platelet ct 252, Urine pregnancy negative. 07/26/2008 CT abdomen/pelvis with/without contrast:  Within the left hepatic lobe there is a focal area of  hyperattenuation measuring 11 mm. This demonstrates minimal  enhancement between the pre and postcontrast images.  04/18/2008: [place] compatible with fatty liver  Hepatitis B and C negative per Dr. Pauletta Browns dictated notes.   Review of Systems see hpi Current Outpatient Prescriptions  Medication Sig Dispense Refill  . albuterol (PROVENTIL HFA;VENTOLIN HFA) 108 (90 BASE) MCG/ACT inhaler Inhale 2 puffs into the lungs every 4 (four) hours as needed.        . bisoprolol (ZEBETA) 5 MG tablet Take 5 mg by mouth daily.        . budesonide-formoterol (SYMBICORT) 160-4.5 MCG/ACT inhaler Inhale 2 puffs into the lungs 2 (two) times daily.        . clonazePAM (KLONOPIN) 1 MG tablet Take 1 mg by mouth 3 (three) times daily as needed.        . lamoTRIgine (LAMICTAL) 150 MG tablet Take 150 mg by mouth daily.        . paroxetine mesylate (PEXEVA) 20 MG tablet Take  20 mg by mouth daily.        . ziprasidone (GEODON) 40 MG capsule Take 40 mg by mouth 2 (two) times daily with a meal.        . lansoprazole (PREVACID) 30 MG capsule Take 30 mg by mouth daily.         Past Medical History  Diagnosis Date  . GERD (gastroesophageal reflux disease)   . Pulmonary nodule   . Depression   . Asthma   . Anxiety   . Headache   . Tachycardia   . Fatty liver   . Hypertension   . Bipolar 1 disorder    Past Surgical History  Procedure Date  . Dilation and curettage of uterus     1 yr ago   History   Social History  . Marital Status: Single    Spouse Name: N/A    Number of Children: N/A  . Years of Education: N/A   Occupational History  . Not on file.   Social History Main Topics  . Smoking status: Current Everyday Smoker  . Smokeless tobacco: Not on file   Comment: 1 pack a day  . Alcohol Use: No  . Drug Use: No  . Sexually Active: Not on file  Other Topics Concern  . Not on file   Social History Narrative  . No narrative on file   Family Status  Relation Status Death Age  . Mother Alive     good health  . Father Alive     good health  . Sister Alive     good health  . Brother Alive     good health   Allergies  Allergen Reactions  . Penicillins         Objective:   Physical Exam Filed Vitals:   09/30/11 1423  Height: 5\' 4"  (1.626 m)  Weight: 239 lb 6.4 oz (108.591 kg)  Alert and oriented. Skin warm and dry. Oral mucosa is moist.   . Sclera anicteric, conjunctivae is pink. Thyroid not enlarged. No cervical lymphadenopathy. Lungs clear. Heart regular rate and rhythm.  Abdomen is soft. Bowel sounds are positive. No hepatomegaly. No abdominal masses, Obese. felt. No tenderness.  No edema to lower extremities. Patient is alert and oriented.       Assessment:    Constipation x 1 week. Nausea usually during day. Fatty liver disease. Abnormal CT.    Plan:    Cmet, H. Pylori, Miralax 1 scoop daily for her constipation. I will  get records from [place]. She has already undergone an [place]. Will repeat a CT abdomen/pelvis.  OV in 3 months. Protonix 40mg  po 30 minutes before breakfast.  Stop sodas.

## 2011-10-01 ENCOUNTER — Encounter (INDEPENDENT_AMBULATORY_CARE_PROVIDER_SITE_OTHER): Payer: Self-pay | Admitting: Internal Medicine

## 2011-10-06 ENCOUNTER — Telehealth (INDEPENDENT_AMBULATORY_CARE_PROVIDER_SITE_OTHER): Payer: Self-pay | Admitting: *Deleted

## 2011-10-06 NOTE — Addendum Note (Signed)
Addended by: Len Blalock on: 10/06/2011 09:11 AM   Modules accepted: Orders

## 2011-10-06 NOTE — Telephone Encounter (Signed)
[place] sch'd 10/09/11 @ 900 (845), npo after midnight, lmom advising patient of appt

## 2011-10-06 NOTE — Telephone Encounter (Signed)
Will order an US abdomen.

## 2011-10-06 NOTE — Telephone Encounter (Signed)
Medicaid has denied Emily [name]'s CT, what is next step

## 2011-10-08 ENCOUNTER — Telehealth (INDEPENDENT_AMBULATORY_CARE_PROVIDER_SITE_OTHER): Payer: Self-pay | Admitting: *Deleted

## 2011-10-08 NOTE — Telephone Encounter (Signed)
She needs to go and have lab work drawn.

## 2011-10-08 NOTE — Telephone Encounter (Signed)
Patient lm asking you to call her at [phone]

## 2011-10-09 ENCOUNTER — Ambulatory Visit (HOSPITAL_COMMUNITY)
Admission: RE | Admit: 2011-10-09 | Discharge: 2011-10-09 | Disposition: A | Discharge status: Home / Self Care | Payer: Medicaid Other | Source: Ambulatory Visit | Attending: Internal Medicine | Admitting: Internal Medicine

## 2011-10-09 ENCOUNTER — Encounter (INDEPENDENT_AMBULATORY_CARE_PROVIDER_SITE_OTHER): Payer: Self-pay | Admitting: *Deleted

## 2011-10-09 ENCOUNTER — Telehealth (INDEPENDENT_AMBULATORY_CARE_PROVIDER_SITE_OTHER): Payer: Self-pay | Admitting: Internal Medicine

## 2011-10-09 DIAGNOSIS — R933 Abnormal findings on diagnostic imaging of other parts of digestive tract: Secondary | ICD-10-CM

## 2011-10-09 DIAGNOSIS — R932 Abnormal findings on diagnostic imaging of liver and biliary tract: Secondary | ICD-10-CM

## 2011-10-09 DIAGNOSIS — K7689 Other specified diseases of liver: Secondary | ICD-10-CM | POA: Insufficient documentation

## 2011-10-09 DIAGNOSIS — K760 Fatty (change of) liver, not elsewhere classified: Secondary | ICD-10-CM

## 2011-10-09 LAB — COMPREHENSIVE METABOLIC PANEL
ALT: 79 U/L — ABNORMAL HIGH (ref 0–35)
AST: 50 U/L — ABNORMAL HIGH (ref 0–37)
Albumin: 4.2 g/dL (ref 3.5–5.2)
Alkaline Phosphatase: 105 U/L (ref 39–117)
BUN: 7 mg/dL (ref 6–23)
CO2: 26 mEq/L (ref 19–32)
Calcium: 9.3 mg/dL (ref 8.4–10.5)
Chloride: 100 mEq/L (ref 96–112)
Creat: 0.7 mg/dL (ref 0.50–1.10)
Glucose, Bld: 100 mg/dL — ABNORMAL HIGH (ref 70–99)
Potassium: 4.4 mEq/L (ref 3.5–5.3)
Sodium: 135 mEq/L (ref 135–145)
Total Bilirubin: 0.5 mg/dL (ref 0.3–1.2)
Total Protein: 6.8 g/dL (ref 6.0–8.3)

## 2011-10-09 NOTE — Telephone Encounter (Signed)
LMP 2 weeks ago per patient

## 2011-10-09 NOTE — Telephone Encounter (Signed)
This encounter was created in error - please disregard.

## 2011-10-10 ENCOUNTER — Encounter (INDEPENDENT_AMBULATORY_CARE_PROVIDER_SITE_OTHER): Payer: Self-pay

## 2011-10-10 LAB — H. PYLORI ANTIBODY, IGG: H Pylori IgG: <0.4 {ISR}

## 2011-10-31 NOTE — Progress Notes (Signed)
Terri & Dr Karilyn Cota need to call Medicaid, they will not approve CT at this time

## 2011-12-14 ENCOUNTER — Encounter (HOSPITAL_COMMUNITY): Payer: Self-pay

## 2011-12-14 ENCOUNTER — Emergency Department (INDEPENDENT_AMBULATORY_CARE_PROVIDER_SITE_OTHER)
Admission: EM | Admit: 2011-12-14 | Discharge: 2011-12-14 | Disposition: A | Discharge status: Home / Self Care | Payer: Medicaid Other | Source: Home / Self Care | Attending: Family Medicine | Admitting: Family Medicine

## 2011-12-14 DIAGNOSIS — J019 Acute sinusitis, unspecified: Secondary | ICD-10-CM

## 2011-12-14 DIAGNOSIS — J40 Bronchitis, not specified as acute or chronic: Secondary | ICD-10-CM

## 2011-12-14 DIAGNOSIS — J410 Simple chronic bronchitis: Secondary | ICD-10-CM

## 2011-12-14 MED ORDER — SULFAMETHOXAZOLE-TRIMETHOPRIM 800-160 MG PO TABS: 1.0000 | ORAL_TABLET | Freq: Two times a day (BID) | ORAL | Status: AC

## 2011-12-14 NOTE — ED Provider Notes (Signed)
History     CSN: [id number]  Arrival date & time 12/14/11  1136   First MD Initiated Contact with Patient 12/14/11 1142      Chief Complaint  Patient presents with  . Cough    (Consider location/radiation/quality/duration/timing/severity/associated sxs/prior treatment) Patient is a 31 y.o. female presenting with cough. The history is provided by the patient and a parent.  Cough This is a new problem. The current episode started more than 1 week ago. The problem has been gradually worsening. The cough is productive of purulent sputum. There has been no fever. Associated symptoms include chills and rhinorrhea. She is a smoker.    Past Medical History  Diagnosis Date  . GERD (gastroesophageal reflux disease)   . Pulmonary nodule   . Depression   . Asthma   . Anxiety   . Headache   . Tachycardia   . Fatty liver   . Hypertension   . Bipolar 1 disorder   . Asthma   . Abnormal CT of liver     Past Surgical History  Procedure Date  . Dilation and curettage of uterus     1 yr ago    Family History  Problem Relation Age of Onset  . Asthma Mother     History  Substance Use Topics  . Smoking status: Current Everyday Smoker -- 1.0 packs/day  . Smokeless tobacco: Not on file   Comment: 1 pack a day  . Alcohol Use: No    OB History    Grav Para Term Preterm Abortions TAB SAB Ect Mult Living                  Review of Systems  Constitutional: Positive for chills.  HENT: Positive for rhinorrhea.   Respiratory: Positive for cough.     Allergies  Penicillins  Home Medications   Current Outpatient Rx  Name Route Sig Dispense Refill  . ALBUTEROL SULFATE HFA 108 (90 BASE) MCG/ACT IN AERS Inhalation Inhale 2 puffs into the lungs every 4 (four) hours as needed.      Marland Kitchen BISOPROLOL FUMARATE 5 MG PO TABS Oral Take 5 mg by mouth daily.      . BUDESONIDE-FORMOTEROL FUMARATE 160-4.5 MCG/ACT IN AERO Inhalation Inhale 2 puffs into the lungs 2 (two) times daily.      Marland Kitchen  CLONAZEPAM 1 MG PO TABS Oral Take 1 mg by mouth 3 (three) times daily as needed.      Marland Kitchen LAMOTRIGINE 150 MG PO TABS Oral Take 150 mg by mouth daily.      Marland Kitchen LANSOPRAZOLE 30 MG PO CPDR Oral Take 30 mg by mouth daily.      Marland Kitchen PANTOPRAZOLE SODIUM 40 MG PO TBEC Oral Take 1 tablet (40 mg total) by mouth daily. 30 tablet 3  . PAROXETINE MESYLATE 20 MG PO TABS Oral Take 20 mg by mouth daily.      . SULFAMETHOXAZOLE-TRIMETHOPRIM 800-160 MG PO TABS Oral Take 1 tablet by mouth 2 (two) times daily. 20 tablet 0  . ZIPRASIDONE HCL 40 MG PO CAPS Oral Take 40 mg by mouth 2 (two) times daily with a meal.        BP 128/90  Pulse 96  Temp 97.7 F (36.5 C) (Oral)  Resp 18  SpO2 90%  LMP 12/14/2011  Physical Exam  Nursing note and vitals reviewed. Constitutional: She is oriented to person, place, and time. She appears well-developed and well-nourished.  HENT:  Right Ear: External ear normal.  Left Ear:  External ear normal.  Nose: Mucosal edema and rhinorrhea present.  Mouth/Throat: Oropharynx is clear and moist.  Eyes: Conjunctivae are normal. Pupils are equal, round, and reactive to light.  Neck: Normal range of motion. Neck supple.  Cardiovascular: Normal rate, regular rhythm, normal heart sounds and intact distal pulses.   Pulmonary/Chest: She has wheezes. She has rhonchi.  Abdominal: Soft. Bowel sounds are normal.  Lymphadenopathy:    She has no cervical adenopathy.  Neurological: She is alert and oriented to person, place, and time.  Skin: Skin is warm and dry.    ED Course  Procedures (including critical care time)  Labs Reviewed - No data to display No results found.   1. Sinusitis acute   2. Bronchitis due to tobacco use       MDM         Linna Hoff, MD 12/14/11 1312

## 2011-12-14 NOTE — ED Notes (Signed)
Pt has cough, chills and sinus congestion for one week.  Pt has asthma and has used inhaler without improvement and she continues to smoke.

## 2011-12-24 ENCOUNTER — Encounter (INDEPENDENT_AMBULATORY_CARE_PROVIDER_SITE_OTHER): Payer: Self-pay | Admitting: *Deleted

## 2012-01-07 ENCOUNTER — Ambulatory Visit (INDEPENDENT_AMBULATORY_CARE_PROVIDER_SITE_OTHER): Payer: Medicaid Other | Admitting: Internal Medicine

## 2012-01-07 ENCOUNTER — Encounter (INDEPENDENT_AMBULATORY_CARE_PROVIDER_SITE_OTHER): Payer: Self-pay | Admitting: Internal Medicine

## 2012-01-07 VITALS — BP 100/70 | HR 72 | Temp 97.7°F | Ht 64.0 in | Wt 227.8 lb

## 2012-01-07 DIAGNOSIS — K769 Liver disease, unspecified: Secondary | ICD-10-CM

## 2012-01-07 DIAGNOSIS — R7401 Elevation of levels of liver transaminase levels: Secondary | ICD-10-CM

## 2012-01-07 DIAGNOSIS — R7402 Elevation of levels of lactic acid dehydrogenase (LDH): Secondary | ICD-10-CM

## 2012-01-07 DIAGNOSIS — R748 Abnormal levels of other serum enzymes: Secondary | ICD-10-CM | POA: Insufficient documentation

## 2012-01-07 DIAGNOSIS — K7689 Other specified diseases of liver: Secondary | ICD-10-CM

## 2012-01-07 NOTE — Progress Notes (Signed)
Subjective:     Patient ID: Emily [name], female   DOB: October 10, 1980, 31 y.o.   MRN: [id number]  HPIHere today for follow up.  She tells me she has no nausea at this time. No problems with her BMs. She is having a BM once a day. Stools are soft.  Appetite is good. She has lost 14 pounds since her last visit which was intentional. Acid reflux is better since changing her diet.  She limits her sodas to two a day. She also has a hx of fatty liver disease and her transaminases have been elevated in the past.  Ct abdomen/pelvis with CM 2010: 1. No acute upper abdominal CT findings.  2. Fatty infiltration of the liver.  3. Indeterminate hyper attenuating and mildly enhancing structure  within the left hepatic lobe measuring 11 mm. This could be  further evaluated with a contrast-enhanced MRI. Alternatively a  follow-up examination in the left 6-12 months is advised.     09/2011 US Abdomen for abnormal CT in 2010  Gallbladder: No gallstones, gallbladder wall thickening, or  pericholecystic fluid.  Common bile duct: Measures 5 mm, within normal limits.  Liver: Diffusely increased echogenicity. This limits focal lesion  detection. No intrahepatic biliary ductal dilatation.  IVC: Appears normal.  Pancreas: No focal abnormality seen. Portions of the tail are  obscured by overlying bowel gas artifact.  Spleen: Measures 7.5 cm. No focal abnormality.  Right Kidney: Measures 11.5 cm. No hydronephrosis or focal  abnormality.  Left Kidney: Measures 10.7 cm. No hydronephrosis or focal  abnormality.  Abdominal aorta: No aneurysm identified.  IMPRESSION:  Increased hepatic echogenicity, in keeping with steatosis.   CMP     Component Value Date/Time   NA 135 10/09/2011 0932   K 4.4 10/09/2011 0932   CL 100 10/09/2011 0932   CO2 26 10/09/2011 0932   GLUCOSE 100* 10/09/2011 0932   BUN 7 10/09/2011 0932   CREATININE 0.70 10/09/2011 0932   CREATININE 0.76 09/28/2006 0255   CALCIUM 9.3 10/09/2011 0932   PROT  6.8 10/09/2011 0932   ALBUMIN 4.2 10/09/2011 0932   AST 50* 10/09/2011 0932   ALT 79* 10/09/2011 0932   ALKPHOS 105 10/09/2011 0932   BILITOT 0.5 10/09/2011 0932   GFRNONAA >60 09/28/2006 0255   GFRAA  Value: >60        The eGFR has been calculated using the MDRD equation. This calculation has not been validated in all clinical 09/28/2006 0255    04/12/2008  HIV negative RPR non  Reactive HBsAG negative Hep B core Ab. Negative. HCVAB negative.   Review of Systems see hpi Current Outpatient Prescriptions on File Prior to Visit  Medication Sig Dispense Refill  . albuterol (PROVENTIL HFA;VENTOLIN HFA) 108 (90 BASE) MCG/ACT inhaler Inhale 2 puffs into the lungs every 4 (four) hours as needed.        . budesonide-formoterol (SYMBICORT) 160-4.5 MCG/ACT inhaler Inhale 2 puffs into the lungs. As needed      . clonazePAM (KLONOPIN) 1 MG tablet Take 1 mg by mouth 3 (three) times daily as needed.        . lamoTRIgine (LAMICTAL) 150 MG tablet Take 150 mg by mouth daily.        . lansoprazole (PREVACID) 30 MG capsule Take 30 mg by mouth daily.        . paroxetine mesylate (PEXEVA) 20 MG tablet Take 20 mg by mouth daily.        . ziprasidone (GEODON) 40 MG  capsule Take 40 mg by mouth 2 (two) times daily with a meal.         Past Medical History  Diagnosis Date  . GERD (gastroesophageal reflux disease)   . Pulmonary nodule   . Depression   . Asthma   . Anxiety   . Headache   . Tachycardia   . Fatty liver   . Hypertension   . Bipolar 1 disorder   . Asthma   . Abnormal CT of liver    Past Surgical History  Procedure Date  . Dilation and curettage of uterus     1 yr ago   History   Social History  . Marital Status: Single    Spouse Name: N/A    Number of Children: N/A  . Years of Education: N/A   Occupational History  . Not on file.   Social History Main Topics  . Smoking status: Current Every Day Smoker -- 1.0 packs/day  . Smokeless tobacco: Not on file   Comment: 1 pack a day  .  Alcohol Use: No  . Drug Use: No  . Sexually Active: Not on file   Other Topics Concern  . Not on file   Social History Narrative  . No narrative on file   Family Status  Relation Status Death Age  . Mother Alive     good health  . Father Alive     good health  . Sister Alive     good health  . Brother Alive     good health   Allergies  Allergen Reactions  . Penicillins         Objective:   Physical Exam.     Filed Vitals:   01/07/12 1405  BP: 100/70  Pulse: 72  Temp: 97.7 F (36.5 C)  Height: 5\' 4"  (1.626 m)  Weight: 227 lb 12.8 oz (103.329 kg)  Alert and oriented. Skin warm and dry. Oral mucosa is moist.   . Sclera anicteric, conjunctivae is pink. Thyroid not enlarged. No cervical lymphadenopathy. Lungs clear. Heart regular rate and rhythm.  Abdomen is soft.  Obese. No tenderness. Bowel sounds are positive. No hepatomegaly. No abdominal masses felt. No tenderness.  No edema to lower extremities.     Assessment:    At present she is not having any GI problems. She does however have hx of elevated transaminases and an abnormal CT with an enhancing lesion within the liver in 2010. Will repeat Cmet today. I discussed with Dr. Karilyn Cota.     Plan:    cmet today. He will review her chart.

## 2012-01-07 NOTE — Patient Instructions (Addendum)
Dr. Karilyn Cota will review your chart and will make a decision about further treatment. Cmet today

## 2012-01-08 ENCOUNTER — Ambulatory Visit (INDEPENDENT_AMBULATORY_CARE_PROVIDER_SITE_OTHER): Payer: Medicaid Other | Admitting: Internal Medicine

## 2012-01-08 LAB — COMPREHENSIVE METABOLIC PANEL
ALT: 46 U/L — ABNORMAL HIGH (ref 0–35)
AST: 39 U/L — ABNORMAL HIGH (ref 0–37)
Albumin: 4.1 g/dL (ref 3.5–5.2)
Alkaline Phosphatase: 117 U/L (ref 39–117)
BUN: 6 mg/dL (ref 6–23)
CO2: 27 mEq/L (ref 19–32)
Calcium: 9.4 mg/dL (ref 8.4–10.5)
Chloride: 100 mEq/L (ref 96–112)
Creat: 0.73 mg/dL (ref 0.50–1.10)
Glucose, Bld: 87 mg/dL (ref 70–99)
Potassium: 4.7 mEq/L (ref 3.5–5.3)
Sodium: 136 mEq/L (ref 135–145)
Total Bilirubin: 0.6 mg/dL (ref 0.3–1.2)
Total Protein: 6.7 g/dL (ref 6.0–8.3)

## 2012-01-14 ENCOUNTER — Other Ambulatory Visit (INDEPENDENT_AMBULATORY_CARE_PROVIDER_SITE_OTHER): Payer: Self-pay | Admitting: Internal Medicine

## 2012-01-14 ENCOUNTER — Telehealth (INDEPENDENT_AMBULATORY_CARE_PROVIDER_SITE_OTHER): Payer: Self-pay | Admitting: Internal Medicine

## 2012-01-14 DIAGNOSIS — K769 Liver disease, unspecified: Secondary | ICD-10-CM

## 2012-01-14 NOTE — Telephone Encounter (Signed)
I have spoke with patient 

## 2012-01-20 ENCOUNTER — Other Ambulatory Visit (INDEPENDENT_AMBULATORY_CARE_PROVIDER_SITE_OTHER): Payer: Self-pay | Admitting: Internal Medicine

## 2012-01-20 ENCOUNTER — Telehealth (INDEPENDENT_AMBULATORY_CARE_PROVIDER_SITE_OTHER): Payer: Self-pay | Admitting: Internal Medicine

## 2012-01-20 DIAGNOSIS — K769 Liver disease, unspecified: Secondary | ICD-10-CM

## 2012-01-20 NOTE — Telephone Encounter (Signed)
MRI abdomen with without contrast 

## 2012-01-21 NOTE — Telephone Encounter (Signed)
Authorization submitted to Medicaid, once they approved I will schedule MRI abd

## 2012-01-21 NOTE — Telephone Encounter (Signed)
MRI sch'd 01/23/12 @ 900 (845), npo after midnight, patient aware

## 2012-01-23 ENCOUNTER — Other Ambulatory Visit (INDEPENDENT_AMBULATORY_CARE_PROVIDER_SITE_OTHER): Payer: Self-pay | Admitting: Internal Medicine

## 2012-01-23 ENCOUNTER — Ambulatory Visit (HOSPITAL_COMMUNITY)
Admission: RE | Admit: 2012-01-23 | Discharge: 2012-01-23 | Disposition: A | Discharge status: Home / Self Care | Payer: Medicaid Other | Source: Ambulatory Visit | Attending: Internal Medicine | Admitting: Internal Medicine

## 2012-01-23 DIAGNOSIS — R160 Hepatomegaly, not elsewhere classified: Secondary | ICD-10-CM | POA: Insufficient documentation

## 2012-01-23 DIAGNOSIS — K769 Liver disease, unspecified: Secondary | ICD-10-CM

## 2012-01-23 DIAGNOSIS — K7689 Other specified diseases of liver: Secondary | ICD-10-CM | POA: Insufficient documentation

## 2012-01-23 MED ORDER — GADOBENATE DIMEGLUMINE 529 MG/ML IV SOLN
20.0000 mL | Freq: Once | INTRAVENOUS | Status: AC | PRN
  Administered 2012-01-23: 20 mL via INTRAVENOUS

## 2012-01-23 NOTE — Progress Notes (Signed)
IV started in left antecubital with 22g angiocath.  

## 2012-02-03 ENCOUNTER — Encounter (INDEPENDENT_AMBULATORY_CARE_PROVIDER_SITE_OTHER): Payer: Self-pay

## 2012-03-02 ENCOUNTER — Ambulatory Visit (INDEPENDENT_AMBULATORY_CARE_PROVIDER_SITE_OTHER): Payer: Medicaid Other | Admitting: Internal Medicine

## 2012-03-02 ENCOUNTER — Encounter (INDEPENDENT_AMBULATORY_CARE_PROVIDER_SITE_OTHER): Payer: Self-pay | Admitting: Internal Medicine

## 2012-03-02 VITALS — BP 100/62 | HR 68 | Temp 97.9°F | Ht 64.0 in | Wt 230.7 lb

## 2012-03-02 DIAGNOSIS — K5900 Constipation, unspecified: Secondary | ICD-10-CM

## 2012-03-02 DIAGNOSIS — K219 Gastro-esophageal reflux disease without esophagitis: Secondary | ICD-10-CM

## 2012-03-02 NOTE — Progress Notes (Signed)
Subjective:     Patient ID: Emily [name], female   DOB: [birth year], 31 y.o.   MRN: [id number]  HPI Sherol presents today with c/o gas, constipation and acid reflux.  She passes flatus. She is having a BM twice a day and she is straining.  She is having acid reflux every night. Appetite is good. No weight loss. No melena. She occasionally see blood when she has BM after straining. She has been drinking a lot of sodas and cheese. She has been upset over boyfriend issues. He has left her.   01/20/2012 MR abdomen: 1. Hepatomegaly and hepatic steatosis. Although exam is mildly  degraded, no focal liver lesion is identified. The abnormality on  the prior exam may have represented an area of focal sparing.  2. No acute abdominal process.  Review of Systems see hpi Current Outpatient Prescriptions  Medication Sig Dispense Refill  . albuterol (PROVENTIL HFA;VENTOLIN HFA) 108 (90 BASE) MCG/ACT inhaler Inhale 2 puffs into the lungs every 4 (four) hours as needed.        . budesonide-formoterol (SYMBICORT) 160-4.5 MCG/ACT inhaler Inhale 2 puffs into the lungs. As needed      . clonazePAM (KLONOPIN) 1 MG tablet Take 1 mg by mouth 3 (three) times daily as needed.        . lamoTRIgine (LAMICTAL) 150 MG tablet Take 150 mg by mouth daily.        . paroxetine mesylate (PEXEVA) 20 MG tablet Take 20 mg by mouth daily.        . ziprasidone (GEODON) 40 MG capsule Take 40 mg by mouth 2 (two) times daily with a meal.         Past Medical History  Diagnosis Date  . GERD (gastroesophageal reflux disease)   . Pulmonary nodule   . Depression   . Asthma   . Anxiety   . Headache   . Tachycardia   . Fatty liver   . Hypertension   . Bipolar 1 disorder   . Asthma   . Abnormal CT of liver    Allergies  Allergen Reactions  . Penicillins         Objective:   Physical Exam Filed Vitals:   03/02/12 1020  BP: 100/62  Pulse: 68  Temp: 97.9 F (36.6 C)  Height: 5\' 4"  (1.626 m)  Weight: 230 lb  11.2 oz (104.645 kg)   Alert and oriented. Skin warm and dry. Oral mucosa is moist.   . Sclera anicteric, conjunctivae is pink. Thyroid not enlarged. No cervical lymphadenopathy. Lungs clear. Heart regular rate and rhythm.  Abdomen is soft. Bowel sounds are positive. No hepatomegaly. No abdominal masses felt. No tenderness.  No edema to lower extremities. Stool brown and guaiac negative    Assessment:    GERD not controlled at this time. She has been off her PPI for a while. Constipation/Flatus:     Plan:   Samples of Prilosec 20mg  given to patient x 2 boxes. Linzess given tp patient for constipation PR in 2 week

## 2012-03-02 NOTE — Patient Instructions (Addendum)
Prilosec 20mg  samples x 2 boxes. Linzess x 2 weeks. PR in 2 weeks. OV in 6 months.

## 2012-03-08 ENCOUNTER — Ambulatory Visit (INDEPENDENT_AMBULATORY_CARE_PROVIDER_SITE_OTHER): Payer: Medicaid Other | Admitting: Internal Medicine

## 2012-06-05 ENCOUNTER — Other Ambulatory Visit: Payer: Self-pay

## 2012-08-30 ENCOUNTER — Ambulatory Visit (INDEPENDENT_AMBULATORY_CARE_PROVIDER_SITE_OTHER): Payer: Medicaid Other | Admitting: Internal Medicine

## 2012-11-08 ENCOUNTER — Ambulatory Visit (INDEPENDENT_AMBULATORY_CARE_PROVIDER_SITE_OTHER): Payer: Medicaid Other | Admitting: Internal Medicine

## 2012-11-09 ENCOUNTER — Telehealth (INDEPENDENT_AMBULATORY_CARE_PROVIDER_SITE_OTHER): Payer: Self-pay | Admitting: *Deleted

## 2012-11-10 NOTE — Telephone Encounter (Signed)
This number is incorrect.

## 2012-11-10 NOTE — Telephone Encounter (Signed)
Emily [name] would like to talk with [name] about her hospital inpatient visit. She has had pneumonia several times. The return phone number is [phone].

## 2012-11-11 NOTE — Telephone Encounter (Signed)
I have already talked with [place]
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
## 2013-01-04 ENCOUNTER — Ambulatory Visit (INDEPENDENT_AMBULATORY_CARE_PROVIDER_SITE_OTHER): Payer: Medicaid Other | Admitting: Internal Medicine

## 2013-02-24 ENCOUNTER — Other Ambulatory Visit: Payer: Self-pay

## 2013-03-28 ENCOUNTER — Telehealth (INDEPENDENT_AMBULATORY_CARE_PROVIDER_SITE_OTHER): Payer: Self-pay | Admitting: *Deleted

## 2013-03-28 NOTE — Telephone Encounter (Signed)
Stools are really dark, looks like coffee grounds and having trouble eating. Her return phone number is [phone].

## 2013-03-28 NOTE — Telephone Encounter (Signed)
I advised her stools are usually black if they have blood. She can go by her PCP and let them check her stool. I doublt she has blood in her stools.

## 2013-03-30 ENCOUNTER — Encounter (INDEPENDENT_AMBULATORY_CARE_PROVIDER_SITE_OTHER): Payer: Self-pay | Admitting: Internal Medicine

## 2013-03-30 ENCOUNTER — Ambulatory Visit (INDEPENDENT_AMBULATORY_CARE_PROVIDER_SITE_OTHER): Payer: Medicaid Other | Admitting: Internal Medicine

## 2013-03-30 VITALS — BP 128/84 | HR 80 | Temp 98.3°F | Ht 64.0 in | Wt 232.8 lb

## 2013-03-30 DIAGNOSIS — K589 Irritable bowel syndrome without diarrhea: Secondary | ICD-10-CM

## 2013-03-30 DIAGNOSIS — K7689 Other specified diseases of liver: Secondary | ICD-10-CM

## 2013-03-30 DIAGNOSIS — K760 Fatty (change of) liver, not elsewhere classified: Secondary | ICD-10-CM

## 2013-03-30 LAB — CBC WITH DIFFERENTIAL/PLATELET
Basophils Absolute: 0.1 10*3/uL (ref 0.0–0.1)
Basophils Relative: 1 % (ref 0–1)
Eosinophils Absolute: 0.3 10*3/uL (ref 0.0–0.7)
Eosinophils Relative: 3 % (ref 0–5)
HCT: 45.3 % (ref 36.0–46.0)
Hemoglobin: 15.7 g/dL — ABNORMAL HIGH (ref 12.0–15.0)
Lymphocytes Relative: 38 % (ref 12–46)
Lymphs Abs: 3.6 10*3/uL (ref 0.7–4.0)
MCH: 31.4 pg (ref 26.0–34.0)
MCHC: 34.7 g/dL (ref 30.0–36.0)
MCV: 90.6 fL (ref 78.0–100.0)
Monocytes Absolute: 0.5 10*3/uL (ref 0.1–1.0)
Monocytes Relative: 6 % (ref 3–12)
Neutro Abs: 5.2 10*3/uL (ref 1.7–7.7)
Neutrophils Relative %: 52 % (ref 43–77)
Platelets: 296 10*3/uL (ref 150–400)
RBC: 5 MIL/uL (ref 3.87–5.11)
RDW: 14 % (ref 11.5–15.5)
WBC: 9.7 10*3/uL (ref 4.0–10.5)

## 2013-03-30 MED ORDER — DICYCLOMINE HCL 10 MG PO CAPS: 10.0000 mg | ORAL_CAPSULE | Freq: Three times a day (TID) | ORAL | Status: DC

## 2013-03-30 NOTE — Telephone Encounter (Signed)
Apt has been scheduled for Emily [name] to see Terri today, 03/30/13, at 2:30 pm.

## 2013-03-30 NOTE — Patient Instructions (Addendum)
Dicyclomine 10mg  TID. OV in 1 month/. CBC and cmet.

## 2013-03-30 NOTE — Progress Notes (Signed)
Subjective:     Patient ID: Emily [name], female   DOB: 1980/12/22, 32 y.o.   MRN: [id number]  HPIHere today as an urgent visit. She tells me she is having abdominal pain. She says when she eats she will go to the bathroom and have a BM. She is having 3-4 stools a day. Stools are loose.  She usually on a normal day has 2 stools a day. She has been drinking a lot of Pepto Bismol. She says her stools have been dark. She feels hot.  Her appetite is not good. She has gained 5 pounds since her last visit last.  No NSAIDs. She has acid reflux daily. She takes Nexium at night for the acid reflux. She does not eat after 6 or 7.  She also has a hx of fatty liver and her transaminases have been elevated in the past 01/20/2012 MRI abdomen: 1. Hepatomegaly and hepatic steatosis. Although exam is mildly  degraded, no focal liver lesion is identified. The abnormality on  the prior exam may have represented an area of focal sparing.  2. No acute abdominal process.      Ct abdomen/pelvis with CM 2010: 1. No acute upper abdominal CT findings.  2. Fatty infiltration of the liver.  3. Indeterminate hyper attenuating and mildly enhancing structure  within the left hepatic lobe measuring 11 mm. This could be  further evaluated with a contrast-enhanced MRI. Alternatively a  follow-up examination in the left 6-12 months is advised.  09/2011 US Abdomen for abnormal CT in 2010 Gallbladder: No gallstones, gallbladder wall thickening, or  pericholecystic fluid.  Common bile duct: Measures 5 mm, within normal limits.  Liver: Diffusely increased echogenicity. This limits focal lesion  detection. No intrahepatic biliary ductal dilatation.  IVC: Appears normal.  Pancreas: No focal abnormality seen. Portions of the tail are  obscured by overlying bowel gas artifact.  Spleen: Measures 7.5 cm. No focal abnormality.  Right Kidney: Measures 11.5 cm. No hydronephrosis or focal  abnormality.  Left Kidney: Measures  10.7 cm. No hydronephrosis or focal  abnormality.  Abdominal aorta: No aneurysm identified.  IMPRESSION:  Increased hepatic echogenicity, in keeping with steatosis.  04/12/2008 HIV negative  RPR non Reactive  HBsAG negative  Hep B core Ab. Negative.  HCVAB negative.       Review of Systems see hpi Current outpatient prescriptions:albuterol (PROVENTIL HFA;VENTOLIN HFA) 108 (90 BASE) MCG/ACT inhaler, Inhale 2 puffs into the lungs every 4 (four) hours as needed.  , Disp: , Rfl: ;  budesonide-formoterol (SYMBICORT) 160-4.5 MCG/ACT inhaler, Inhale 2 puffs into the lungs. As needed, Disp: , Rfl: ;  clonazePAM (KLONOPIN) 1 MG tablet, Take 1 mg by mouth 3 (three) times daily as needed.  , Disp: , Rfl:  lamoTRIgine (LAMICTAL) 150 MG tablet, Take 150 mg by mouth daily.  , Disp: , Rfl: ;  paroxetine mesylate (PEXEVA) 20 MG tablet, Take 20 mg by mouth daily.  , Disp: , Rfl: ;  ziprasidone (GEODON) 40 MG capsule, Take 40 mg by mouth 2 (two) times daily with a meal.  , Disp: , Rfl:  Past Medical History  Diagnosis Date  . GERD (gastroesophageal reflux disease)   . Pulmonary nodule   . Depression   . Asthma   . Anxiety   . Headache(784.0)   . Tachycardia   . Fatty liver   . Hypertension   . Bipolar 1 disorder   . Asthma   . Abnormal CT of liver    Past  Surgical History  Procedure Laterality Date  . Dilation and curettage of uterus      1 yr ago   Allergies  Allergen Reactions  . Penicillins          Objective:   Physical Exam  Filed Vitals:   03/30/13 1440  BP: 128/84  Pulse: 80  Temp: 98.3 F (36.8 C)  Height: 5\' 4"  (1.626 m)  Weight: 232 lb 12.8 oz (105.597 kg)   Alert and oriented. Skin warm and dry. Oral mucosa is moist.   . Sclera anicteric, conjunctivae is pink. Thyroid not enlarged. No cervical lymphadenopathy. Lungs clear. Heart regular rate and rhythm.  Abdomen is soft. Bowel sounds are positive. Liver feels 3 finger breadths below costal margin. No abdominal  masses felt. No tenderness.  No edema to lower extremities. Stool brown and guaiac negative. (I showed Patient specimen).      Assessment:    Dark stools. Abdominal cramps associated with her BMs. ? IBS. Stools was guaiac negative today. Patient has been taking Pepto Bismol.   Hx of elevated liver enzymes and fatty liver/heptomegaly.  Will get a CBC and Cmet on her today Plan:    Will try her on Dicyclomine 10mg  TID and see how she does. Will see her back in the office in a month. CBC and cmet today.
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
## 2013-03-30 NOTE — Telephone Encounter (Signed)
noted 

## 2013-03-31 LAB — COMPREHENSIVE METABOLIC PANEL
ALT: 110 U/L — ABNORMAL HIGH (ref 0–35)
AST: 164 U/L — ABNORMAL HIGH (ref 0–37)
Albumin: 4.4 g/dL (ref 3.5–5.2)
Alkaline Phosphatase: 220 U/L — ABNORMAL HIGH (ref 39–117)
BUN: 5 mg/dL — ABNORMAL LOW (ref 6–23)
CO2: 22 mEq/L (ref 19–32)
Calcium: 10 mg/dL (ref 8.4–10.5)
Chloride: 95 mEq/L — ABNORMAL LOW (ref 96–112)
Creat: 0.62 mg/dL (ref 0.50–1.10)
Glucose, Bld: 338 mg/dL — ABNORMAL HIGH (ref 70–99)
Potassium: 4.1 mEq/L (ref 3.5–5.3)
Sodium: 128 mEq/L — ABNORMAL LOW (ref 135–145)
Total Bilirubin: 0.8 mg/dL (ref 0.3–1.2)
Total Protein: 7.4 g/dL (ref 6.0–8.3)

## 2013-05-03 ENCOUNTER — Ambulatory Visit (INDEPENDENT_AMBULATORY_CARE_PROVIDER_SITE_OTHER): Payer: Medicaid Other | Admitting: Internal Medicine

## 2014-08-03 LAB — PULMONARY FUNCTION TEST

## 2015-01-08 LAB — PULMONARY FUNCTION TEST

## 2015-02-12 ENCOUNTER — Telehealth: Payer: Self-pay

## 2015-02-12 NOTE — Telephone Encounter (Signed)
if she is living here in [place] now, then I would see her.  But if living in [place] or that direction, it wouldn't make sense for her to come here as there are plenty of good providers in [place] area

## 2015-02-12 NOTE — Telephone Encounter (Signed)
She called wanting to see you, but she has Medicaid. I told her we weren't accepting Medicaid. She said she [facility] [place]. Since she is an old pt of yours do you want to make an exception and still see her?

## 2015-02-14 NOTE — Telephone Encounter (Signed)
The only phone number we have on file for her isn't in service, so I couldn't get in touch with her.

## 2015-02-20 ENCOUNTER — Ambulatory Visit (INDEPENDENT_AMBULATORY_CARE_PROVIDER_SITE_OTHER): Payer: Medicaid Other | Admitting: Medical

## 2015-02-20 ENCOUNTER — Encounter: Payer: Self-pay | Admitting: Medical

## 2015-02-20 ENCOUNTER — Telehealth: Payer: Self-pay

## 2015-02-20 VITALS — BP 140/90 | HR 84 | Temp 98.6°F | Ht 64.0 in | Wt 210.0 lb

## 2015-02-20 DIAGNOSIS — M79603 Pain in arm, unspecified: Secondary | ICD-10-CM

## 2015-02-20 DIAGNOSIS — L609 Nail disorder, unspecified: Secondary | ICD-10-CM | POA: Diagnosis not present

## 2015-02-20 DIAGNOSIS — I10 Essential (primary) hypertension: Secondary | ICD-10-CM | POA: Diagnosis not present

## 2015-02-20 DIAGNOSIS — E038 Other specified hypothyroidism: Secondary | ICD-10-CM | POA: Diagnosis not present

## 2015-02-20 DIAGNOSIS — M791 Myalgia, unspecified site: Secondary | ICD-10-CM

## 2015-02-20 DIAGNOSIS — M79606 Pain in leg, unspecified: Secondary | ICD-10-CM | POA: Insufficient documentation

## 2015-02-20 DIAGNOSIS — M79609 Pain in unspecified limb: Secondary | ICD-10-CM | POA: Insufficient documentation

## 2015-02-20 DIAGNOSIS — E118 Type 2 diabetes mellitus with unspecified complications: Secondary | ICD-10-CM | POA: Diagnosis not present

## 2015-02-20 DIAGNOSIS — Z720 Tobacco use: Secondary | ICD-10-CM | POA: Diagnosis not present

## 2015-02-20 DIAGNOSIS — L608 Other nail disorders: Secondary | ICD-10-CM | POA: Insufficient documentation

## 2015-02-20 DIAGNOSIS — F17200 Nicotine dependence, unspecified, uncomplicated: Secondary | ICD-10-CM | POA: Insufficient documentation

## 2015-02-20 DIAGNOSIS — E039 Hypothyroidism, unspecified: Secondary | ICD-10-CM | POA: Insufficient documentation

## 2015-02-20 LAB — CBC
HCT: 46 % (ref 36.0–46.0)
Hemoglobin: 15.6 g/dL — ABNORMAL HIGH (ref 12.0–15.0)
MCH: 30.4 pg (ref 26.0–34.0)
MCHC: 33.9 g/dL (ref 30.0–36.0)
MCV: 89.5 fL (ref 78.0–100.0)
MPV: 9.7 fL (ref 8.6–12.4)
Platelets: 340 10*3/uL (ref 150–400)
RBC: 5.14 MIL/uL — ABNORMAL HIGH (ref 3.87–5.11)
RDW: 13.4 % (ref 11.5–15.5)
WBC: 13.2 10*3/uL — ABNORMAL HIGH (ref 4.0–10.5)

## 2015-02-20 LAB — COMPREHENSIVE METABOLIC PANEL
ALT: 40 U/L — ABNORMAL HIGH (ref 6–29)
AST: 35 U/L — ABNORMAL HIGH (ref 10–30)
Albumin: 4.1 g/dL (ref 3.6–5.1)
Alkaline Phosphatase: 147 U/L — ABNORMAL HIGH (ref 33–115)
BUN: 4 mg/dL — ABNORMAL LOW (ref 7–25)
CO2: 27 mmol/L (ref 20–31)
Calcium: 9.4 mg/dL (ref 8.6–10.2)
Chloride: 100 mmol/L (ref 98–110)
Creat: 0.6 mg/dL (ref 0.50–1.10)
Glucose, Bld: 113 mg/dL — ABNORMAL HIGH (ref 65–99)
Potassium: 3.9 mmol/L (ref 3.5–5.3)
Sodium: 136 mmol/L (ref 135–146)
Total Bilirubin: 0.6 mg/dL (ref 0.2–1.2)
Total Protein: 7.2 g/dL (ref 6.1–8.1)

## 2015-02-20 LAB — LIPID PANEL
Cholesterol: 251 mg/dL — ABNORMAL HIGH (ref 125–200)
HDL: 29 mg/dL — ABNORMAL LOW (ref >=46)
Total CHOL/HDL Ratio: 8.7 Ratio — ABNORMAL HIGH (ref <=5.0)
Triglycerides: 516 mg/dL — ABNORMAL HIGH (ref <150)

## 2015-02-20 LAB — MAGNESIUM: Magnesium: 2 mg/dL (ref 1.5–2.5)

## 2015-02-20 NOTE — Progress Notes (Signed)
Subjective: Chief Complaint  Patient presents with  . new pt    written on chart was having computer difficulties. arm and leg pain. no pap in 1 yr. lung doc, dr Frann Rider in [place].    Here as a new patient today.   I was seeing her up in [place], [place] as a patient prior.    She has numerous medical problems including diabetes, hypertension, hypothyroidism, bipolar disorder, anxiety, smoker, chronic pain, asthma, and frequent pneumonia.  She notes most recently was getting care at the health dept.  Had labs about 3 months ago.   She is here to establish care.   She notes recently having aches in arms and legs, more distally, but no recent injury, fall or trauma.   She notes sometimes getting tingling in legs.  She smokes about 1ppd.  No alcohol.  She sees a Paramedic and psychiatrist at [facility] in [place] for mental health.   Sees a lung doctor in [place] for recurrent pneumonia and asthma.   She is not taking any inhalers currently but does note some dyspnea.  Had CXR and lung CT within past month in [place].   She notes that she had gotten to 250lb, but lost a lot of weight this past year, came off janumet.   Recently though checking glucose BID and getting numbers mostly above 160 regularly.  Got 233 this morning.   No other aggravating or relieving factors. No other complaint.  Past Medical History  Diagnosis Date  . GERD (gastroesophageal reflux disease)   . Pulmonary nodule   . Depression   . Asthma   . Anxiety   . Headache(784.0)   . Tachycardia   . Fatty liver   . Hypertension   . Bipolar 1 disorder (HCC)   . Asthma   . Abnormal CT of liver    ROS as in subjective   Objective: BP 140/90 mmHg  Pulse 84  Temp(Src) 98.6 F (37 C) (Oral)  Ht  (1.626 m)  Wt 210 lb (95.255 kg)  BMI 36.03 kg/m2  Gen: wd, wn, nad Skin unremarkable.   Left great toenail thickened and more curved than normal, somewhat tender.   Otherwise toenails are somewhat  thickened of bilat great toes.   Heart: RRR, normal s1, s2, no murmurs Lungs somewhat decreased in general.  No wheezes, rhonchi, rales.   Ext: no edema Pulses normal UE and LE Diabetic Foot Exam - Simple   Simple Foot Form  Diabetic Foot exam was performed with the following findings:  Yes 02/20/2015  1:10 PM  Visual Inspection  See comments:  Yes  Sensation Testing  Intact to touch and monofilament testing bilaterally:  Yes  Pulse Check  Posterior Tibialis and Dorsalis pulse intact bilaterally:  Yes  Comments  bilat great toenails thickened nails, tender left toenail in general with pressure, somewhat yellowish coloration of great toenails.       Assessment: Encounter Diagnoses  Name Primary?  . Diabetes mellitus with complication in adult patient (HCC) Yes  . Smoker   . Essential hypertension   . Pain of upper extremity, unspecified laterality   . Myalgia   . Other specified hypothyroidism   . Leg pain, inferior, unspecified laterality   . Toenail deformity     Plan: Labs today.   discussed her concerns.   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  No deformity, no abnormal exam findings. Hypothyroidism - labs today

## 2015-02-20 NOTE — Telephone Encounter (Signed)
Medical records received on 02/20/15

## 2015-02-21 LAB — HEMOGLOBIN A1C
Hgb A1c MFr Bld: 6.2 % — ABNORMAL HIGH (ref <5.7)
Mean Plasma Glucose: 131 mg/dL — ABNORMAL HIGH (ref <117)

## 2015-02-21 LAB — T4, FREE: Free T4: 1.24 ng/dL (ref 0.80–1.80)

## 2015-02-21 LAB — TSH: TSH: 1.279 u[IU]/mL (ref 0.350–4.500)

## 2015-02-22 ENCOUNTER — Encounter: Payer: Self-pay | Admitting: Medical

## 2015-02-22 ENCOUNTER — Other Ambulatory Visit: Payer: Self-pay | Admitting: Medical

## 2015-02-22 MED ORDER — AMLODIPINE BESYLATE 10 MG PO TABS: 10.0000 mg | ORAL_TABLET | Freq: Every day | ORAL | Status: DC

## 2015-02-22 MED ORDER — LISINOPRIL 10 MG PO TABS: 10.0000 mg | ORAL_TABLET | Freq: Every day | ORAL | Status: DC

## 2015-02-22 MED ORDER — LEVOTHYROXINE SODIUM 75 MCG PO TABS: 75.0000 ug | ORAL_TABLET | Freq: Every day | ORAL | Status: DC

## 2015-02-22 MED ORDER — MONTELUKAST SODIUM 10 MG PO TABS: 10.0000 mg | ORAL_TABLET | Freq: Every day | ORAL | Status: DC

## 2015-02-22 MED ORDER — ATORVASTATIN CALCIUM 10 MG PO TABS: 10.0000 mg | ORAL_TABLET | Freq: Every day | ORAL | Status: DC

## 2015-02-22 MED ORDER — VITAMIN D (ERGOCALCIFEROL) 1.25 MG (50000 UNIT) PO CAPS: 50000.0000 [IU] | ORAL_CAPSULE | ORAL | Status: DC

## 2015-02-26 ENCOUNTER — Telehealth: Payer: Self-pay

## 2015-02-26 NOTE — Telephone Encounter (Signed)
Pt called for me to go back over her lab results with her, I did and she said she would schedule follow up in 6 weeks

## 2015-03-01 ENCOUNTER — Telehealth: Payer: Self-pay | Admitting: Medical

## 2015-03-01 ENCOUNTER — Other Ambulatory Visit: Payer: Self-pay | Admitting: Medical

## 2015-03-01 MED ORDER — ALBUTEROL SULFATE HFA 108 (90 BASE) MCG/ACT IN AERS: 2.0000 | INHALATION_SPRAY | RESPIRATORY_TRACT | Status: DC | PRN

## 2015-03-01 NOTE — Telephone Encounter (Signed)
She has had these symptoms for x2 days, no fever, a little wheezing but not a lot. She has Mucinex but hasn't had to use it yet. She says she needs a new inhaler.

## 2015-03-01 NOTE — Telephone Encounter (Signed)
Its helpful to find out how many days she's had this, whether she has had fever, wheezing?   At this point, is she using mucinex, how often is she having to use inhaler?  If symptoms >1 wk, then call out Zpak if she is not allergic to this.  Zpak 250mg , 2 tablets day 1, 1 tablet days 2-4, #6 and no refill

## 2015-03-01 NOTE — Telephone Encounter (Signed)
Have her use Mucinex DM and inhaler was sent

## 2015-03-01 NOTE — Telephone Encounter (Signed)
Pt informed

## 2015-03-01 NOTE — Telephone Encounter (Signed)
That was a get established visit where we reviewed several things but none in any great detail.    What are her current symptoms?  Of note, I think she has a pulmonologist as well.  Find out and let me know ASAP

## 2015-03-01 NOTE — Telephone Encounter (Signed)
Pt says that she keeps coughing up clear phlegm and chest feels tight. She says she [facility] the chest tight[facility] at her appointment last time. Pt is concerned because she's had pnuemonia 3 times in the past. She is not able to get off work to come in for an appointment today or tomorrow so she wants to know if Vincenza HewsShane will give her meds before the weekends and her symptoms get worse.

## 2015-03-01 NOTE — Telephone Encounter (Signed)
Symptoms include: chest tightness, runny nose, and watery eyes. She said every year around this time she gets sick. Yes, she does have a pulmonologist, it is Dr. Frann [facility]Javid at [facility] in [place].

## 2015-03-06 ENCOUNTER — Encounter: Payer: Self-pay | Admitting: Medical

## 2015-03-07 ENCOUNTER — Encounter: Payer: Self-pay | Admitting: Medical

## 2015-03-20 ENCOUNTER — Telehealth: Payer: Self-pay

## 2015-03-20 NOTE — Telephone Encounter (Signed)
Medical Records [facility] to [facility] on 03/20/15

## 2015-03-29 ENCOUNTER — Ambulatory Visit: Payer: Medicaid Other | Admitting: Medical

## 2015-03-29 ENCOUNTER — Telehealth: Payer: Self-pay

## 2015-03-29 NOTE — Telephone Encounter (Signed)
This patient no showed for their appointment today.Which of the following is necessary for this patient.   A) No follow-up necessary   B) Follow-up urgent. Locate Patient Immediately.   C) Follow-up necessary. Contact patient and Schedule visit in ____ Days.   D) Follow-up Advised. Contact patient and Schedule visit in ____ Days. 

## 2015-03-29 NOTE — Telephone Encounter (Signed)
D

## 2015-03-30 ENCOUNTER — Encounter: Payer: Self-pay | Admitting: Medical

## 2015-03-30 NOTE — Telephone Encounter (Signed)
No show letter sent.

## 2015-04-17 ENCOUNTER — Telehealth: Payer: Self-pay | Admitting: Medical

## 2015-04-17 NOTE — Telephone Encounter (Signed)
Yes needs f/u from last/first visit, and can go ahead and make referral she is requesting.   If toe is infected, needs to be see ASAP

## 2015-04-17 NOTE — Telephone Encounter (Signed)
Pt is calling back to schedule appt. Pt did not mention the referral on the phone said she was busy and would call back.

## 2015-04-17 NOTE — Telephone Encounter (Signed)
Please call  Re: we were referring  to foot doctor but she has not heard anything. [place]      Toe nail is purple and really infected and hurts to touch, Been this way for a few weeks   Her blood sugar this morning was 241. She started back on her Janumet. She went back on it a week or 2 ago   Do you want her to just schedule appointment back to see you??

## 2015-04-18 ENCOUNTER — Encounter: Payer: Self-pay | Admitting: Medical

## 2015-04-18 ENCOUNTER — Ambulatory Visit (INDEPENDENT_AMBULATORY_CARE_PROVIDER_SITE_OTHER): Payer: Medicaid Other | Admitting: Medical

## 2015-04-18 VITALS — BP 100/80 | HR 94 | Temp 97.5°F | Wt 208.0 lb

## 2015-04-18 DIAGNOSIS — I10 Essential (primary) hypertension: Secondary | ICD-10-CM

## 2015-04-18 DIAGNOSIS — L600 Ingrowing nail: Secondary | ICD-10-CM | POA: Diagnosis not present

## 2015-04-18 DIAGNOSIS — E785 Hyperlipidemia, unspecified: Secondary | ICD-10-CM | POA: Insufficient documentation

## 2015-04-18 DIAGNOSIS — E559 Vitamin D deficiency, unspecified: Secondary | ICD-10-CM | POA: Diagnosis not present

## 2015-04-18 DIAGNOSIS — K130 Diseases of lips: Secondary | ICD-10-CM | POA: Insufficient documentation

## 2015-04-18 DIAGNOSIS — Z23 Encounter for immunization: Secondary | ICD-10-CM | POA: Insufficient documentation

## 2015-04-18 DIAGNOSIS — E118 Type 2 diabetes mellitus with unspecified complications: Secondary | ICD-10-CM

## 2015-04-18 DIAGNOSIS — E119 Type 2 diabetes mellitus without complications: Secondary | ICD-10-CM | POA: Insufficient documentation

## 2015-04-18 MED ORDER — PROPRANOLOL HCL 20 MG PO TABS: 20.0000 mg | ORAL_TABLET | Freq: Two times a day (BID) | ORAL | Status: DC

## 2015-04-18 MED ORDER — CEPHALEXIN 500 MG PO CAPS: 500.0000 mg | ORAL_CAPSULE | Freq: Three times a day (TID) | ORAL | Status: DC

## 2015-04-18 MED ORDER — SITAGLIPTIN PHOS-METFORMIN HCL 50-1000 MG PO TABS: 1.0000 | ORAL_TABLET | Freq: Two times a day (BID) | ORAL | Status: DC

## 2015-04-18 MED ORDER — TERBINAFINE HCL 1 % EX CREA: 1.0000 "application " | TOPICAL_CREAM | Freq: Two times a day (BID) | CUTANEOUS | Status: DC

## 2015-04-18 NOTE — Progress Notes (Signed)
Subjective: Chief Complaint  Patient presents with  . toe infection    lt foot, big toe. said it has been this way a couple months and sore to touch.   . blood sugar    241 blood sugar when she woke up. started janumet back. did not check it this  morning  . rash on face    not sure from where and it itches   Here for several concerns  Main c/o today is left great toenail ingrowing and painful and red  She notes rash and corner of right lips and face  She wants flu shot today  Since last visit started Lisinopril which was added to the regimen  She started back on Janumet 50/1000 mg BID since last visit  Objective: BP 100/80 mmHg  Pulse 94  Temp(Src) 97.5 F (36.4 C) (Oral)  Wt 208 lb (94.348 kg)  SpO2 92%  LMP 04/05/2015 (Exact Date)  Gen: wd, wn, nad Right corner of lips and adjacent to angle of lip corner with patch of erythema and rough skin Left great toe almost U shaped abnormally curved with ingrowing into medial nail bed, slight erythema and slight swelling, somewhat tender, no drainage Heart RRR normal s1, s2, no murmurs Lungs clear Ext: no edema Pulses normal   Assessment: Encounter Diagnoses  Name Primary?  . Type 2 diabetes mellitus with complication, without long-term current use of insulin (HCC) Yes  . Need for influenza vaccination   . Ingrowing toenail   . Vitamin D deficiency   . Angular cheilitis   . Essential hypertension   . Dyslipidemia     Plan: Diabetes type 2 - reviewed recent labs, counseled on healthy diet and exercise.   C/t Janumet 50/1000mg  BID Counseled on the influenza virus vaccine.  Vaccine information sheet given.  Influenza vaccine given after consent obtained. Ingrowing toenail - referral to podiatry Vit D deficiency - reviewed recent labs, c/t ergocalciferol 50k units weekly and plan to change to Vit D 2000 IU when the first 3 mo supply is over Angular cheilitis - avoid lip licking, increase water intake.  If worsening can  begin Lamisil cream, but should resolve with water intake  HTN - improved, c/t Amlodipine, Propranolol and Lisinopril added last visit Dyslipidemia - c/t same medications, plan to recheck labs with physical in 69mo  Emily [name] was seen today for toe infection, blood sugar and rash on face.  Diagnoses and all orders for this visit:  Type 2 diabetes mellitus with complication, without long-term current use of insulin (HCC)  Need for influenza vaccination -     Flu Vaccine QUAD 36+ mos IM  Ingrowing toenail  Vitamin D deficiency  Angular cheilitis  Essential hypertension  Dyslipidemia  Other orders -     cephALEXin (KEFLEX) 500 MG capsule; Take 1 capsule (500 mg total) by mouth 3 (three) times daily. -     terbinafine (LAMISIL AT) 1 % cream; Apply 1 application topically 2 (two) times daily. -     propranolol (INDERAL) 20 MG tablet; Take 1 tablet (20 mg total) by mouth 2 (two) times daily. -     sitaGLIPtin-metformin (JANUMET) 50-1000 MG tablet; Take 1 tablet by mouth 2 (two) times daily with a meal.

## 2015-04-25 ENCOUNTER — Telehealth: Payer: Self-pay

## 2015-04-25 NOTE — Telephone Encounter (Signed)
Spoke with pt she called concerned for her health because of something that was said her last visit. She stated she was walking daily and has been loosing weight. Told her to schedule follow up for new labs and to discuss things with shane she is supposed to be calling back with she figures out her ride situation. Her cholestrol and fats were high. Advised her on diet choices and she told me she drinks at least 3 bottles of water daily.

## 2015-05-10 ENCOUNTER — Telehealth: Payer: Self-pay

## 2015-05-10 NOTE — Telephone Encounter (Signed)
Started taking janumet, states she is having awful diarrhea. Used the bathroom on her self already twice today. Is not taking the janumet until we get back to her

## 2015-05-11 NOTE — Telephone Encounter (Signed)
Have her go to once daily with this to lower the metformin and lets see how she does.

## 2015-05-11 NOTE — Telephone Encounter (Signed)
Pt returned the call. I let her know to go to once daily

## 2015-05-11 NOTE — Telephone Encounter (Signed)
LMTCB

## 2015-05-29 ENCOUNTER — Telehealth: Payer: Self-pay | Admitting: Medical

## 2015-05-29 NOTE — Telephone Encounter (Signed)
Records request received from [facility] vocational Rehabilitation services. Records faxed to [phone]

## 2015-06-08 ENCOUNTER — Telehealth: Payer: Self-pay

## 2015-06-08 NOTE — Telephone Encounter (Signed)
Pt called because she has a boil on her private areas. [place] Spaniel it is huge and throbbing. No providers have any openings. Emily [name] advised her to go to UC because pt wanted to pop it herself.

## 2015-06-18 ENCOUNTER — Encounter: Payer: Self-pay | Admitting: Medical

## 2015-06-18 ENCOUNTER — Ambulatory Visit (INDEPENDENT_AMBULATORY_CARE_PROVIDER_SITE_OTHER): Payer: Medicaid Other | Admitting: Medical

## 2015-06-18 VITALS — BP 114/80 | HR 85 | Wt 206.0 lb

## 2015-06-18 DIAGNOSIS — E118 Type 2 diabetes mellitus with unspecified complications: Secondary | ICD-10-CM

## 2015-06-18 DIAGNOSIS — I10 Essential (primary) hypertension: Secondary | ICD-10-CM

## 2015-06-18 DIAGNOSIS — L0293 Carbuncle, unspecified: Secondary | ICD-10-CM | POA: Diagnosis not present

## 2015-06-18 MED ORDER — DOXYCYCLINE HYCLATE 100 MG PO TABS: ORAL_TABLET | ORAL | Status: DC

## 2015-06-18 MED ORDER — LIRAGLUTIDE 18 MG/3ML ~~LOC~~ SOPN: 1.8000 mg | PEN_INJECTOR | Freq: Every day | SUBCUTANEOUS | Status: DC

## 2015-06-18 NOTE — Progress Notes (Signed)
Subjective: Chief Complaint  Patient presents with  . cyst infected    have already burst, 2 on side and one on neck.wants to have fasting blood work doen today. also wants you to look at her teeth. said her knee pops when walks.    Here for cysts.   She called in recently about 2 cysts or boils on the vagina that burst and drained on their own, and have cleared up.  She recently in the last few days had similar cysts/boils on the back of her neck and in each armpit.   Has another lumpy tender area of left posterior chest wall, however it doesn't drain.  The neck and armpit lesions get red and swollen from time to time.    Gets recurrent boils for the last several months.    Compliant with BP medications started last visit  Diabetes - compliant with Janumet but sugars running in the 200s still.    Past Medical History  Diagnosis Date  . GERD (gastroesophageal reflux disease)   . Pulmonary nodule   . Depression   . Asthma   . Anxiety   . Headache(784.0)   . Tachycardia   . Fatty liver   . Hypertension   . Bipolar 1 disorder (HCC)   . Asthma   . Abnormal CT of liver   . Hyperlipidemia   . Thyroid disease   . Chronic pain   . Bipolar disorder [facility])    Current Outpatient Prescriptions on File Prior to Visit  Medication Sig Dispense Refill  . amLODipine (NORVASC) 10 MG tablet Take 1 tablet (10 mg total) by mouth daily. 90 tablet 1  . atorvastatin (LIPITOR) 10 MG tablet Take 1 tablet (10 mg total) by mouth daily. 90 tablet 1  . clonazePAM (KLONOPIN) 1 MG tablet Take 1 mg by mouth 3 (three) times daily as needed.      . lamoTRIgine (LAMICTAL) 150 MG tablet Take 150 mg by mouth daily.      Marland Kitchen levothyroxine (SYNTHROID, LEVOTHROID) 75 MCG tablet Take 1 tablet (75 mcg total) by mouth daily before breakfast. 90 tablet 3  . lisinopril (ZESTRIL) 10 MG tablet Take 1 tablet (10 mg total) by mouth daily. 90 tablet 1  . montelukast (SINGULAIR) 10 MG tablet Take 1 tablet (10 mg total) by mouth at  bedtime. 90 tablet 3  . paroxetine mesylate (PEXEVA) 20 MG tablet Take 20 mg by mouth daily.      . propranolol (INDERAL) 20 MG tablet Take 1 tablet (20 mg total) by mouth 2 (two) times daily. 180 tablet 1  . sitaGLIPtin-metformin (JANUMET) 50-1000 MG tablet Take 1 tablet by mouth 2 (two) times daily with a meal. 180 tablet 1  . Vitamin D, Ergocalciferol, (DRISDOL) 50000 UNITS CAPS capsule Take 1 capsule (50,000 Units total) by mouth every 7 (seven) days. 4.5 capsule 3  . ziprasidone (GEODON) 40 MG capsule Take 40 mg by mouth 2 (two) times daily with a meal.      . albuterol (PROVENTIL HFA;VENTOLIN HFA) 108 (90 BASE) MCG/ACT inhaler Inhale 2 puffs into the lungs every 4 (four) hours as needed. (Patient not taking: Reported on 04/18/2015) 18 g 1  . budesonide-formoterol (SYMBICORT) 160-4.5 MCG/ACT inhaler Inhale 2 puffs into the lungs. Reported on 06/18/2015    . cephALEXin (KEFLEX) 500 MG capsule Take 1 capsule (500 mg total) by mouth 3 (three) times daily. (Patient not taking: Reported on 06/18/2015) 30 capsule 0  . dicyclomine (BENTYL) 10 MG capsule Take 1  capsule (10 mg total) by mouth 3 (three) times daily before meals. (Patient not taking: Reported on 02/20/2015) 90 capsule 2  . terbinafine (LAMISIL AT) 1 % cream Apply 1 application topically 2 (two) times daily. (Patient not taking: Reported on 06/18/2015) 30 g 0   No current facility-administered medications on file prior to visit.     ROS as in subjective   Objective: BP 114/80 mmHg  Pulse 85  Wt 206 lb (93.441 kg)  LMP 05/02/2015  Wt Readings from Last 3 Encounters:  06/18/15 206 lb (93.441 kg)  04/18/15 208 lb (94.348 kg)  02/20/15 210 lb (95.255 kg)   Gen: wd, wn, nad Posterior neck with 2cm x 1cm area of tenderness and nodule, with central pore and drop of blood expressed, no fluctuance ,no warmth, no induration.  There are healing small lumps in bilat axilla c/w recent hidradenitis lesions, tender mobile 1cm x 2cm area of left  posterolateral chest wall    Assessment: Encounter Diagnoses  Name Primary?  . Recurrent boils Yes  . Type 2 diabetes mellitus with complication, without long-term current use of insulin (HCC)   . Essential hypertension     Plan: Recurrent boils - begin Doxycycline BID x 1 week then daily for prophylaxis.  Diabetes - c/t Janumet, but add Victoza QHS.  C/t to monitor glucose, need to use diabetic diet.  HTN - controlled on current medication, c/t amlodipine  daily, lisinopril  daily, Inderal   BID  Emily [name] was seen today for cyst infected.  Diagnoses and all orders for this visit:  Recurrent boils  Type 2 diabetes mellitus with complication, without long-term current use of insulin (HCC)  Essential hypertension  Other orders -     doxycycline (VIBRA-TABS) 100 MG tablet; BID x 1 week, then daily for prophylaxis -     Liraglutide 18 MG/3ML SOPN; Inject 0.3 mLs (1.8 mg total) into the skin at bedtime.

## 2015-06-19 ENCOUNTER — Telehealth: Payer: Self-pay

## 2015-06-19 ENCOUNTER — Telehealth: Payer: Self-pay | Admitting: Medical

## 2015-06-19 MED ORDER — ALBUTEROL SULFATE HFA 108 (90 BASE) MCG/ACT IN AERS: 2.0000 | INHALATION_SPRAY | RESPIRATORY_TRACT | Status: DC | PRN

## 2015-06-19 NOTE — Telephone Encounter (Signed)
When I went to put in Z pac, was given high drug interaction warning with Ziprasidone.  I called pt and she is indeed still taking the Ziprasidone.   Called Shane back and he wants her on Doxycycline and Albuterol.  I called pharmacy and cancelled Z-pac & & filled the Albuterol & pharmacy said Rx Doxycycline still there to be picked up.  I called pt and advised to pick up both Rx's.

## 2015-06-19 NOTE — Telephone Encounter (Signed)
Pt called back and wanted status of previous phone call. Vincenza Hews was called and he stated to call in zpac for pt. [facility] [place] call ZPAC 250 mg 2 pills day 1, 1 pill day 2-4 #6 no refills.  Note to Vincenza Hews pt states she usually either takes prednisone (although she does get a red face reaction) or some other antibiotic. Pt states she has had ZPAC's multiple time before.

## 2015-06-19 NOTE — Telephone Encounter (Signed)
Pt called and stated her breathing has worsened today. She stated she called her lung doctor and can not get in today. Wants to know if you can send her in anything without her coming in since she was just here yesterday. Said she is wheezing and coughing up phlegm.

## 2015-06-20 ENCOUNTER — Other Ambulatory Visit: Payer: Self-pay | Admitting: Medical

## 2015-06-20 MED ORDER — METHYLPREDNISOLONE 4 MG PO TABS: ORAL_TABLET | ORAL | Status: DC

## 2015-06-20 NOTE — Telephone Encounter (Signed)
Stop the Doxycycline for now.   I also put her on Keflex for skin but this should also help if she has respiratory infection.   What does her pulmonologist usually give her when she has respiratory flare up since she can't tolerate Prednisone?

## 2015-06-20 NOTE — Telephone Encounter (Signed)
Pt is aware.  

## 2015-06-20 NOTE — Telephone Encounter (Signed)
When I saw her in office the plan was to use keflex BID for a week then daily for prophylaxis.   We then called in Doxycycline for COPD flare.   So is she not tolerating Keflex, Doxycycline, or do we just need to back off both antibiotics and start over?   Unless she had immediate problem with Keflex, I thing she should take this for the time being.    If she doesn't tolerate prednisone, then we should use something else.   Has she tolerated methylprednisolone/Medrol?

## 2015-06-20 NOTE — Telephone Encounter (Signed)
Pt called and said that the doxycyclline was making her sick to her stomach, and that she was throwing up everywhere, she threw up 5 times, and thinks the medicine made her sick, pt uses [id number] [place], [place] - [address] please advise patient,

## 2015-06-20 NOTE — Telephone Encounter (Signed)
Pt states he gives her the prednisone anyways and "levo something" she said she can not take keflex. Had a low grade fever this morning.

## 2015-06-20 NOTE — Telephone Encounter (Signed)
Lets try round of Medrol steroid which I sent to pharmacy

## 2015-06-20 NOTE — Telephone Encounter (Signed)
Says that she had bad reactions to both and cant take them. Said she has not tried methylprednisolone

## 2015-06-26 ENCOUNTER — Telehealth: Payer: Self-pay | Admitting: Medical

## 2015-06-26 NOTE — Telephone Encounter (Signed)
The bumps on the side seemed to be cysts.  We can refer to [facility] [place] if she wants to have them cut out?

## 2015-06-26 NOTE — Telephone Encounter (Signed)
Pt said yes and information faxed over

## 2015-06-26 NOTE — Telephone Encounter (Signed)
Pt states that bumps on her side are very sore and starting to spread to other places

## 2015-06-26 NOTE — Telephone Encounter (Signed)
P.A. VICTOZA approved by phoned Medicaid t#[id number] [id number] approved til 06/19/16.  Called pharmacy and went thru for $3 Pt informed

## 2015-06-27 ENCOUNTER — Telehealth: Payer: Self-pay

## 2015-06-27 DIAGNOSIS — L0293 Carbuncle, unspecified: Secondary | ICD-10-CM

## 2015-06-27 NOTE — Telephone Encounter (Signed)
[facility]

## 2015-07-02 ENCOUNTER — Telehealth: Payer: Self-pay | Admitting: Medical

## 2015-07-02 NOTE — Telephone Encounter (Signed)
One of the cysts has appeared on her side and it is very sore. Pt wants to know if she can be sent for testing to find out why they keep coming back.

## 2015-07-02 NOTE — Telephone Encounter (Signed)
Is this one on the side/back?  If its one of the back ones, I recommend general surgery consult.   If its in another location, let me know.  These are cysts, they won't necessarily just go away.    They can occur for no good reason, they just happen.

## 2015-07-03 NOTE — Telephone Encounter (Signed)
LMTCB but pt has appt with gen surgery on 07/10/15 at 130pm

## 2015-07-03 NOTE — Telephone Encounter (Signed)
Pt states that she 4-5 of the cysts and she has one that is the size of a golf ball. Pt has an appt at gen surgery on the 21st. States the newest on is under her ribs.

## 2015-07-11 ENCOUNTER — Telehealth: Payer: Self-pay

## 2015-07-11 ENCOUNTER — Other Ambulatory Visit: Payer: Self-pay | Admitting: Medical

## 2015-07-11 NOTE — Telephone Encounter (Signed)
Pt states she forgot about it and will call and reschedule.

## 2015-07-11 NOTE — Telephone Encounter (Signed)
pls call and inquire about the no show.  advise that it looks bad on us when patients no show.   Its already hard to find providers to take patients with her type of insurance, so no show makes it even much harder to get her into specialist.     She can call them back to see if they will allow her to reschedule

## 2015-07-11 NOTE — Telephone Encounter (Signed)
Pt returned my call but when called back had to leave a VM

## 2015-07-11 NOTE — Telephone Encounter (Signed)
Called pt because she no showed her appt [facility] . LMTCB

## 2015-07-27 ENCOUNTER — Telehealth: Payer: Self-pay | Admitting: Medical

## 2015-07-27 NOTE — Telephone Encounter (Signed)
Received request to fax records to [facility] [place]. Records faxed to [phone].

## 2015-10-03 ENCOUNTER — Encounter: Payer: Medicaid Other | Admitting: Obstetrics and Gynecology

## 2015-10-08 ENCOUNTER — Encounter: Payer: Medicaid Other | Admitting: Obstetrics and Gynecology

## 2015-10-11 ENCOUNTER — Encounter: Payer: Self-pay | Admitting: *Deleted

## 2015-10-16 ENCOUNTER — Encounter: Payer: Self-pay | Admitting: Obstetrics and Gynecology

## 2015-10-16 ENCOUNTER — Ambulatory Visit (INDEPENDENT_AMBULATORY_CARE_PROVIDER_SITE_OTHER): Payer: Medicaid Other | Admitting: Obstetrics and Gynecology

## 2015-10-16 DIAGNOSIS — A5901 Trichomonal vulvovaginitis: Secondary | ICD-10-CM | POA: Diagnosis not present

## 2015-10-16 DIAGNOSIS — N750 Cyst of Bartholin's gland: Secondary | ICD-10-CM

## 2015-10-16 DIAGNOSIS — Z113 Encounter for screening for infections with a predominantly sexual mode of transmission: Secondary | ICD-10-CM | POA: Diagnosis not present

## 2015-10-16 MED ORDER — TINIDAZOLE 500 MG PO TABS: 2.0000 g | ORAL_TABLET | Freq: Every day | ORAL | Status: DC

## 2015-10-16 NOTE — Progress Notes (Signed)
Patient ID: Emily [name], female   DOB: Mar 26, 1981, 35 y.o.   MRN: [id number]   [facility]  @DATE @            Patient name: Emily BasemanCassandra L [name] MRN [id number]  Date of birth: Mar 26, 1981  CC & HPI:  Emily [name] is a 35 y.o. female presenting today for  Suspected persistent trich p trx with pv metrogel, pt cannot tolerate po flagyl.  ROS:  ROS   Pertinent History Reviewed:   Reviewed: Significant for partner no longer in picture. Medical         Past Medical History  Diagnosis Date  . GERD (gastroesophageal reflux disease)   . Pulmonary nodule   . Depression   . Asthma   . Anxiety   . Headache(784.0)   . Tachycardia   . Fatty liver   . Hypertension   . Bipolar 1 disorder (HCC)   . Asthma   . Abnormal CT of liver   . Hyperlipidemia   . Thyroid disease   . Chronic pain   . Bipolar disorder [facility](HCC)                               Surgical Hx:    Past Surgical History  Procedure Laterality Date  . Dilation and curettage of uterus      1 yr ago   Medications: Reviewed & Updated - see associated section                       Current outpatient prescriptions:  .  albuterol (PROVENTIL HFA;VENTOLIN HFA) 108 (90 Base) MCG/ACT inhaler, Inhale 2 puffs into the lungs every 4 (four) hours as needed., Disp: 18 g, Rfl: 1 .  amLODipine (NORVASC) 10 MG tablet, Take 1 tablet (10 mg total) by mouth daily., Disp: 90 tablet, Rfl: 1 .  budesonide-formoterol (SYMBICORT) 160-4.5 MCG/ACT inhaler, Inhale 2 puffs into the lungs. Reported on 06/18/2015, Disp: , Rfl:  .  lamoTRIgine (LAMICTAL) 150 MG tablet, Take 150 mg by mouth daily.  , Disp: , Rfl:  .  levothyroxine (SYNTHROID, LEVOTHROID) 75 MCG tablet, Take 1 tablet (75 mcg total) by mouth daily before breakfast., Disp: 90 tablet, Rfl: 3 .  montelukast (SINGULAIR) 10 MG tablet, Take 1 tablet (10 mg total) by mouth at bedtime., Disp: 90 tablet, Rfl: 3 .  paroxetine mesylate (PEXEVA) 20 MG tablet, Take 20 mg by mouth daily.  ,  Disp: , Rfl:  .  propranolol (INDERAL) 20 MG tablet, Take 1 tablet (20 mg total) by mouth 2 (two) times daily., Disp: 180 tablet, Rfl: 1 .  sitaGLIPtin-metformin (JANUMET) 50-1000 MG tablet, Take 1 tablet by mouth 2 (two) times daily with a meal., Disp: 180 tablet, Rfl: 1 .  ziprasidone (GEODON) 40 MG capsule, Take 40 mg by mouth 2 (two) times daily with a meal.  , Disp: , Rfl:  .  atorvastatin (LIPITOR) 10 MG tablet, Take 1 tablet (10 mg total) by mouth daily. (Patient not taking: Reported on 10/16/2015), Disp: 90 tablet, Rfl: 1 .  doxycycline (VIBRA-TABS) 100 MG tablet, BID x 1 week, then daily for prophylaxis, Disp: 45 tablet, Rfl: 1 .  terbinafine (LAMISIL AT) 1 % cream, Apply 1 application topically 2 (two) times daily. (Patient not taking: Reported on 06/18/2015), Disp: 30 g, Rfl: 0 .  tinidazole (TINDAMAX) 500 MG tablet, Take 4 tablets (2,000 mg total) by mouth  daily with breakfast., Disp: 4 tablet, Rfl: 2 .  Vitamin D, Ergocalciferol, (DRISDOL) 50000 UNITS CAPS capsule, Take 1 capsule (50,000 Units total) by mouth every 7 (seven) days. (Patient not taking: Reported on 10/16/2015), Disp: 4.5 capsule, Rfl: 3   Social History: Reviewed -  reports that she has been smoking.  She has never used smokeless tobacco.  Objective Findings:  Vitals: There were no vitals taken for this visit.  Physical Examination: General appearance - alert, well appearing, and in no distress, oriented to person, place, and time and overweight Mental status - alert, oriented to person, place, and time, normal mood, behavior, speech, dress, motor activity, and thought processes, depressed mood Abdomen - soft, nontender, nondistended, no masses or organomegaly Pelvic - VULVA: vulvar excoriation barth cyst on rt 2 cm, excoriated from d/c, VAGINA: vaginal discharge - copious, creamy, green, grey and malodorous, WET MOUNT done - results: clue cells, trichomonads, CERVIX: normal appearing cervix without discharge or lesions,  UTERUS: uterus is normal size, shape, consistency and nontender   Assessment & Plan:   A:  1. Recurrent trich 2 barth cyst  3. Desires std screen  P:  1. GC/Chl. Hiv/RPR  2 Tinidazole 2 gm po 3 return promplty for Word Catheter.

## 2015-10-17 ENCOUNTER — Telehealth: Payer: Self-pay | Admitting: Obstetrics and Gynecology

## 2015-10-17 NOTE — Addendum Note (Signed)
Addended by: Criss AlvinePULLIAM, Meliana Canner G on: 10/17/2015 10:31 AM   Modules accepted: Orders

## 2015-10-17 NOTE — Telephone Encounter (Signed)
Pt aware that per Dr. Rayna SextonFerguson's note she would need to take the four tablets just once to have taken 2gm.

## 2015-10-19 LAB — GC/CHLAMYDIA PROBE AMP
Chlamydia trachomatis, NAA: NEGATIVE
Neisseria gonorrhoeae by PCR: NEGATIVE

## 2015-10-22 ENCOUNTER — Ambulatory Visit: Payer: Medicaid Other | Admitting: Obstetrics and Gynecology

## 2015-10-31 ENCOUNTER — Telehealth: Payer: Self-pay | Admitting: Obstetrics and Gynecology

## 2015-11-06 NOTE — Telephone Encounter (Signed)
Pt states that she has stopped having the greenish discharge but now is having a brownish discharge. Spoke with Dr. Emelda FearFerguson and he advised that she should take another round of the antibiotic and if the discharge is no better then she would need to be seen. Pt verbalized understanding.

## 2015-11-09 ENCOUNTER — Other Ambulatory Visit: Payer: Self-pay | Admitting: Medical

## 2015-11-21 ENCOUNTER — Telehealth: Payer: Self-pay

## 2015-11-21 NOTE — Telephone Encounter (Signed)
As of 07/21/2015, pt has transferred care.

## 2015-11-22 ENCOUNTER — Telehealth: Payer: Self-pay | Admitting: *Deleted

## 2015-11-22 NOTE — Telephone Encounter (Signed)
Pt states that she thinks she has a sinus infection and wanted to know if Dr. Emelda Fear could call in an antibiotic. I advised the pt that he would not give her an antibiotic for that unless she was seen, she stated that she has an appointment next Friday. I advised the pt that she would have to be seen to get anything. Pt verbalized understanding.

## 2015-11-27 ENCOUNTER — Other Ambulatory Visit: Payer: Self-pay | Admitting: Medical

## 2015-11-30 ENCOUNTER — Ambulatory Visit: Payer: Medicaid Other | Admitting: Obstetrics and Gynecology

## 2015-12-04 ENCOUNTER — Ambulatory Visit: Payer: Medicaid Other | Admitting: Obstetrics and Gynecology

## 2015-12-13 ENCOUNTER — Ambulatory Visit: Payer: Medicaid Other | Admitting: Obstetrics and Gynecology

## 2015-12-15 ENCOUNTER — Telehealth: Payer: Self-pay | Admitting: Family Medicine

## 2015-12-17 ENCOUNTER — Ambulatory Visit (INDEPENDENT_AMBULATORY_CARE_PROVIDER_SITE_OTHER): Payer: Medicaid Other | Admitting: Obstetrics and Gynecology

## 2015-12-17 ENCOUNTER — Other Ambulatory Visit: Payer: Self-pay | Admitting: Medical

## 2015-12-17 ENCOUNTER — Telehealth: Payer: Self-pay | Admitting: *Deleted

## 2015-12-17 ENCOUNTER — Encounter: Payer: Self-pay | Admitting: Obstetrics and Gynecology

## 2015-12-17 VITALS — BP 152/80 | HR 80 | Ht 63.0 in | Wt 199.8 lb

## 2015-12-17 DIAGNOSIS — L0293 Carbuncle, unspecified: Secondary | ICD-10-CM

## 2015-12-17 DIAGNOSIS — N750 Cyst of Bartholin's gland: Secondary | ICD-10-CM | POA: Diagnosis not present

## 2015-12-17 DIAGNOSIS — A5901 Trichomonal vulvovaginitis: Secondary | ICD-10-CM

## 2015-12-17 DIAGNOSIS — N9089 Other specified noninflammatory disorders of vulva and perineum: Secondary | ICD-10-CM | POA: Diagnosis not present

## 2015-12-17 MED ORDER — FLUCONAZOLE 150 MG PO TABS: 150.0000 mg | ORAL_TABLET | Freq: Once | ORAL | 1 refills | Status: AC

## 2015-12-17 MED ORDER — TINIDAZOLE 500 MG PO TABS: 2.0000 g | ORAL_TABLET | Freq: Every day | ORAL | 1 refills | Status: DC

## 2015-12-17 MED ORDER — METRONIDAZOLE 500 MG PO TABS: 500.0000 mg | ORAL_TABLET | Freq: Two times a day (BID) | ORAL | 0 refills | Status: DC

## 2015-12-17 NOTE — Progress Notes (Signed)
Patient ID: Emily [name], female   DOB: 1980/12/16, 35 y.o.   MRN: [id number]    [facility] Visit  @DATE @            Patient name: Emily [name] MRN [id number]  Date of birth: 04/05/1981  CC & HPI:   Chief Complaint  Patient presents with  . c/o vaginal cyst and itching    Pt requesting testing for "cervical cancer"     Emily [name] is a 35 y.o. female presenting today for recently increased swelling and redness to an existing vaginal cyst this week. Pt also complains of vaginal itching and dryness today. Pt states her itching is similar to prior tric infections. She reports she has treated the infection with metronidazole but this treatment provided no relief. She states both she and her partner were treated for tric and they used protection during intercourse following the infection.   She also requests a pap smear as she is overdue for testing.   ROS:  Review of Systems  Genitourinary:       +vaginal itching, vaginal dryness, increased swelling and redness to existing vaginal cyst     Pertinent History Reviewed:   Reviewed: Significant for D&C Medical         Past Medical History:  Diagnosis Date  . Abnormal CT of liver   . Anxiety   . Asthma   . Asthma   . Bipolar 1 disorder (HCC)   . Bipolar disorder (HCC)   . Chronic pain   . Depression   . Fatty liver   . GERD (gastroesophageal reflux disease)   . Headache(784.0)   . Hyperlipidemia   . Hypertension   . Pulmonary nodule   . Tachycardia   . Thyroid disease                               Surgical Hx:    Past Surgical History:  Procedure Laterality Date  . DILATION AND CURETTAGE OF UTERUS     1 yr ago   Medications: Reviewed & Updated - see associated section                       Current Outpatient Prescriptions:  .  albuterol (PROVENTIL HFA;VENTOLIN HFA) 108 (90 Base) MCG/ACT inhaler, Inhale 2 puffs into the lungs every 4 (four) hours as needed., Disp: 18 g, Rfl: 1 .  amLODipine  (NORVASC) 10 MG tablet, TAKE 1 TABLET (10 MG TOTAL) BY MOUTH DAILY., Disp: 90 tablet, Rfl: 1 .  atorvastatin (LIPITOR) 10 MG tablet, Take 1 tablet (10 mg total) by mouth daily., Disp: 90 tablet, Rfl: 1 .  budesonide-formoterol (SYMBICORT) 160-4.5 MCG/ACT inhaler, Inhale 2 puffs into the lungs. Reported on 06/18/2015, Disp: , Rfl:  .  JANUMET 50-1000 MG tablet, TAKE 1 TABLET BY MOUTH TWICE A DAY WITH A MEAL, Disp: 180 tablet, Rfl: 0 .  lamoTRIgine (LAMICTAL) 150 MG tablet, Take 150 mg by mouth daily.  , Disp: , Rfl:  .  levothyroxine (SYNTHROID, LEVOTHROID) 75 MCG tablet, Take 1 tablet (75 mcg total) by mouth daily before breakfast., Disp: 90 tablet, Rfl: 3 .  montelukast (SINGULAIR) 10 MG tablet, Take 1 tablet (10 mg total) by mouth at bedtime., Disp: 90 tablet, Rfl: 3 .  paroxetine mesylate (PEXEVA) 20 MG tablet, Take 20 mg by mouth daily.  , Disp: , Rfl:  .  propranolol (INDERAL) 20  MG tablet, Take 1 tablet (20 mg total) by mouth 2 (two) times daily., Disp: 180 tablet, Rfl: 1 .  ziprasidone (GEODON) 40 MG capsule, Take 40 mg by mouth 2 (two) times daily with a meal.  , Disp: , Rfl:    Social History: Reviewed -  reports that she has been smoking.  She has been smoking about 1.00 pack per day. She has never used smokeless tobacco.  Objective Findings:  Vitals: Blood pressure (!) 152/80, pulse 80, height 5\' 3"  (1.6 m), weight 199 lb 12.8 oz (90.6 kg), last menstrual period 12/12/2015.  Physical Examination: General appearance - alert, well appearing, and in no distress Mental status - alert, oriented to person, place, and time Abdomen - soft, nontender, nondistended, no masses or organomegaly Pelvic -  VULVA: no masses, tenderness or lesions, vulvar irritation secondary to chronic discharge  VAGINA: Extensive redness and erythema to the vaginal walls. Pink tinged discharge present in the vaginal vault. Small early bartholin cyst.  CERVIX: normal appearing cervix without discharge or lesions   Musculoskeletal - no joint tenderness, deformity or swelling Extremities - peripheral pulses normal, no pedal edema, no clubbing or cyanosis Skin - normal coloration and turgor, no rashes, no suspicious skin lesions noted   Assessment & Plan:   A: trichomoniasis 1. Early Bartholin's cyst  2. Extensive vulvar irritation secondary to chronic discharge  3. Wet prep and KOH collected/ + tric  P:  1. Will rx Metronidazole x 7 days for pt and partner 2. Will rx Diflucan empirically   3. F/u in 1 week to lance cyst  4. Return for pap smear after treatment of tric   By signing my name below, I, Doreatha MartinEva Mathews, attest that this documentation has been prepared under the direction and in the presence of Tilda BurrowJohn V Mekaela Azizi, MD. Electronically Signed: Doreatha MartinEva Mathews, ED Scribe. 12/17/15. 3:11 PM. I personally performed the services described in this documentation, which was SCRIBED in my presence. The recorded information has been reviewed and considered accurate. It has been edited as necessary during review. Tilda BurrowFERGUSON,Jamayia Croker V, MD

## 2015-12-17 NOTE — Telephone Encounter (Signed)
Rx for flagyl generated at office, [facility] [place]

## 2015-12-17 NOTE — Patient Instructions (Signed)
Trichomoniasis Trichomoniasis is an infection caused by an organism called Trichomonas. The infection can affect both women and men. In women, the outer female genitalia and the vagina are affected. In men, the penis is mainly affected, but the prostate and other reproductive organs can also be involved. Trichomoniasis is a sexually transmitted infection (STI) and is most often passed to another person through sexual contact.  RISK FACTORS  Having unprotected sexual intercourse.  Having sexual intercourse with an infected partner. SIGNS AND SYMPTOMS  Symptoms of trichomoniasis in women include:  Abnormal gray-green frothy vaginal discharge.  Itching and irritation of the vagina.  Itching and irritation of the area outside the vagina. Symptoms of trichomoniasis in men include:   Penile discharge with or without pain.  Pain during urination. This results from inflammation of the urethra. DIAGNOSIS  Trichomoniasis may be found during a Pap test or physical exam. Your health care provider may use one of the following methods to help diagnose this infection:  Testing the pH of the vagina with a test tape.  Using a vaginal swab test that checks for the Trichomonas organism. A test is available that provides results within a few minutes.  Examining a urine sample.  Testing vaginal secretions. Your health care provider may test you for other STIs, including HIV. TREATMENT   You may be given medicine to fight the infection. Women should inform their health care provider if they could be or are pregnant. Some medicines used to treat the infection should not be taken during pregnancy.  Your health care provider may recommend over-the-counter medicines or creams to decrease itching or irritation.  Your sexual partner will need to be treated if infected.  Your health care provider may test you for infection again 3 months after treatment. HOME CARE INSTRUCTIONS   Take medicines only as  directed by your health care provider.  Take over-the-counter medicine for itching or irritation as directed by your health care provider.  Do not have sexual intercourse while you have the infection.  Women should not douche or wear tampons while they have the infection.  Discuss your infection with your partner. Your partner may have gotten the infection from you, or you may have gotten it from your partner.  Have your sex partner get examined and treated if necessary.  Practice safe, informed, and protected sex.  See your health care provider for other STI testing. SEEK MEDICAL CARE IF:   You still have symptoms after you finish your medicine.  You develop abdominal pain.  You have pain when you urinate.  You have bleeding after sexual intercourse.  You develop a rash.  Your medicine makes you sick or makes you throw up (vomit). MAKE SURE YOU:  Understand these instructions.  Will watch your condition.  Will get help right away if you are not doing well or get worse.   This information is not intended to replace advice given to you by your health care provider. Make sure you discuss any questions you have with your health care provider.   Document Released: 10/01/2000 Document Revised: 04/28/2014 Document Reviewed: 01/17/2013 Elsevier Interactive Patient Education 2016 Elsevier Inc.  

## 2015-12-17 NOTE — Telephone Encounter (Signed)
Pt states can not take Flagyl causes "chest to get really get tight."  Pt partners name Rhodia AlbrightJermaine Webster, DOB 12/28/1977, [place] [facility] [place]. Needs Rx for treatment.

## 2015-12-17 NOTE — Telephone Encounter (Signed)
Recv'd P.A. But pt has transferred out.  Tried to reach pt by phone but voice mail not set up.  Faxed note to pharmacy

## 2015-12-18 ENCOUNTER — Telehealth: Payer: Self-pay | Admitting: Obstetrics and Gynecology

## 2015-12-18 NOTE — Telephone Encounter (Signed)
RX for Flagyl faxed per request.

## 2015-12-18 NOTE — Telephone Encounter (Signed)
Pt informed [facility] [place] partner, Rhodia AlbrightJermaine Webster, DOB 12/24/1977.

## 2015-12-25 ENCOUNTER — Ambulatory Visit: Payer: Medicaid Other | Admitting: Obstetrics and Gynecology

## 2016-01-01 ENCOUNTER — Encounter: Payer: Self-pay | Admitting: Obstetrics and Gynecology

## 2016-01-01 ENCOUNTER — Other Ambulatory Visit (HOSPITAL_COMMUNITY)
Admission: RE | Admit: 2016-01-01 | Discharge: 2016-01-01 | Disposition: A | Discharge status: Home / Self Care | Payer: Medicaid Other | Source: Ambulatory Visit | Attending: Obstetrics and Gynecology | Admitting: Obstetrics and Gynecology

## 2016-01-01 ENCOUNTER — Ambulatory Visit (INDEPENDENT_AMBULATORY_CARE_PROVIDER_SITE_OTHER): Payer: Medicaid Other | Admitting: Obstetrics and Gynecology

## 2016-01-01 VITALS — BP 110/70 | HR 82 | Ht 63.0 in | Wt 200.4 lb

## 2016-01-01 DIAGNOSIS — N750 Cyst of Bartholin's gland: Secondary | ICD-10-CM | POA: Diagnosis not present

## 2016-01-01 DIAGNOSIS — A5901 Trichomonal vulvovaginitis: Secondary | ICD-10-CM

## 2016-01-01 DIAGNOSIS — Z01411 Encounter for gynecological examination (general) (routine) with abnormal findings: Secondary | ICD-10-CM | POA: Diagnosis present

## 2016-01-01 DIAGNOSIS — Z1151 Encounter for screening for human papillomavirus (HPV): Secondary | ICD-10-CM | POA: Diagnosis present

## 2016-01-01 DIAGNOSIS — Z124 Encounter for screening for malignant neoplasm of cervix: Secondary | ICD-10-CM | POA: Diagnosis not present

## 2016-01-01 NOTE — Progress Notes (Signed)
[facility] Visit  01/01/16        Patient name: Emily BasemanCassandra L Syverson MRN [id number]  Date of birth: Jun 25, 1980  CC & HPI:  Emily [name] is a 35 y.o. female presenting today for follow up from visit on 12/17/15. Pt voices that she would like to have a pap smear today to evaluate for cancer cells. Pt also voices if she is able to have her vaginal cyst drained at another visit. Pt has associated symptoms of vaginal dryness. Pt has not tried any medications for the relief of her symptoms. Pt denies any other symptoms.   ROS:  Review of Systems  Genitourinary:       +vaginal cyst, vaginal dryness     Pertinent History Reviewed:   Reviewed: Significant for HTN, hyperlipidemia,  Medical         Past Medical History:  Diagnosis Date  . Abnormal CT of liver   . Anxiety   . Asthma   . Asthma   . Bipolar 1 disorder (HCC)   . Bipolar disorder (HCC)   . Chronic pain   . Depression   . Fatty liver   . GERD (gastroesophageal reflux disease)   . Headache(784.0)   . Hyperlipidemia   . Hypertension   . Pulmonary nodule   . Tachycardia   . Thyroid disease                               Surgical Hx:    Past Surgical History:  Procedure Laterality Date  . DILATION AND CURETTAGE OF UTERUS     1 yr ago   Medications: Reviewed & Updated - see associated section                       Current Outpatient Prescriptions:  .  albuterol (PROVENTIL HFA;VENTOLIN HFA) 108 (90 Base) MCG/ACT inhaler, Inhale 2 puffs into the lungs every 4 (four) hours as needed., Disp: 18 g, Rfl: 1 .  amLODipine (NORVASC) 10 MG tablet, TAKE 1 TABLET (10 MG TOTAL) BY MOUTH DAILY., Disp: 90 tablet, Rfl: 1 .  atorvastatin (LIPITOR) 10 MG tablet, Take 1 tablet (10 mg total) by mouth daily., Disp: 90 tablet, Rfl: 1 .  budesonide-formoterol (SYMBICORT) 160-4.5 MCG/ACT inhaler, Inhale 2 puffs into the lungs. Reported on 06/18/2015, Disp: , Rfl:  .  JANUMET 50-1000 MG tablet, TAKE 1 TABLET BY MOUTH TWICE A DAY WITH  A MEAL, Disp: 180 tablet, Rfl: 0 .  lamoTRIgine (LAMICTAL) 150 MG tablet, Take 150 mg by mouth daily.  , Disp: , Rfl:  .  levothyroxine (SYNTHROID, LEVOTHROID) 75 MCG tablet, Take 1 tablet (75 mcg total) by mouth daily before breakfast., Disp: 90 tablet, Rfl: 3 .  montelukast (SINGULAIR) 10 MG tablet, Take 1 tablet (10 mg total) by mouth at bedtime., Disp: 90 tablet, Rfl: 3 .  paroxetine mesylate (PEXEVA) 20 MG tablet, Take 20 mg by mouth daily.  , Disp: , Rfl:  .  propranolol (INDERAL) 20 MG tablet, Take 1 tablet (20 mg total) by mouth 2 (two) times daily., Disp: 180 tablet, Rfl: 1 .  ziprasidone (GEODON) 40 MG capsule, Take 40 mg by mouth 2 (two) times daily with a meal.  , Disp: , Rfl:  .  tinidazole (TINDAMAX) 500 MG tablet, Take 4 tablets (2,000 mg total) by mouth daily with breakfast. (Patient not taking: Reported on 01/01/2016), Disp: 4 tablet, Rfl:  1   Social History: Reviewed -  reports that she has been smoking.  She has been smoking about 1.00 pack per day. She has never used smokeless tobacco.  Objective Findings:  Vitals: Blood pressure 110/70, pulse 82, height 5\' 3"  (1.6 m), weight 200 lb 6.4 oz (90.9 kg), last menstrual period 12/12/2015.  Physical Examination:  General appearance - alert, well appearing, and in no distress and oriented to person, place, and time Mental status - alert, oriented to person, place, and time, normal mood, behavior, speech, dress, motor activity, and thought processes Pelvic - normal external genitalia, vulva, vagina, cervix, uterus and adnexa,  VULVA: normal appearing vulva with no masses, tenderness or lesions, complete resolution of suspected bartholin gland VAGINA: normal appearing vagina with normal color and discharge, no lesions,  CERVIX: normal appearing cervix without discharge or lesions,  WET MOUNT done - results: moderate amount of white blood cells, no trich UTERUS: uterus is normal size, shape, consistency and nontender,  ADNEXA: normal  adnexa in size, nontender and no masses   Assessment & Plan:   A:  1. Resolved trichomonas 2 pap done P:  1. Pap smear to be completed today. 2. Follow up PRN or 1 year    By signing my name below, I, Soijett Blue, attest that this documentation has been prepared under the direction and in the presence of Tilda Burrow, MD. Electronically Signed: Soijett Blue, ED Scribe. 01/01/16. 2:46 PM.   I personally performed the services described in this documentation, which was SCRIBED in my presence. The recorded information has been reviewed and considered accurate. It has been edited as necessary during review. Tilda Burrow, MD

## 2016-01-03 LAB — CYTOLOGY - PAP

## 2016-01-07 ENCOUNTER — Telehealth: Payer: Self-pay | Admitting: *Deleted

## 2016-01-07 NOTE — Telephone Encounter (Signed)
Pt informed Pap from 01/01/2016 WNL. Pt verbalized understanding.

## 2016-04-11 ENCOUNTER — Other Ambulatory Visit: Payer: Self-pay | Admitting: Medical

## 2016-04-29 ENCOUNTER — Other Ambulatory Visit: Payer: Self-pay | Admitting: Medical

## 2016-05-01 ENCOUNTER — Other Ambulatory Visit: Payer: Self-pay | Admitting: Medical

## 2016-05-20 ENCOUNTER — Telehealth: Payer: Self-pay | Admitting: Obstetrics and Gynecology

## 2016-05-20 NOTE — Telephone Encounter (Signed)
Patient called stating she is having a brownish discharge with odor, she has been treated for recurrent Trich infections. Patient does not know when she will be able to come in for an appointment to be seen. She would like an antibiotic and vaginal cream. Please advise.

## 2016-05-21 ENCOUNTER — Other Ambulatory Visit: Payer: Self-pay | Admitting: Obstetrics and Gynecology

## 2016-05-21 DIAGNOSIS — A5901 Trichomonal vulvovaginitis: Secondary | ICD-10-CM

## 2016-05-21 MED ORDER — TINIDAZOLE 500 MG PO TABS: 2.0000 g | ORAL_TABLET | Freq: Every day | ORAL | 1 refills | Status: DC

## 2016-05-21 NOTE — Progress Notes (Signed)
Pt with recurrent d/c . Will refil rx.  Pt will neen proof of cure appt.in 2-4 wk

## 2016-05-25 ENCOUNTER — Other Ambulatory Visit: Payer: Self-pay | Admitting: Medical

## 2016-05-26 NOTE — Telephone Encounter (Signed)
Called pt  Phone was disconnected

## 2016-08-26 ENCOUNTER — Ambulatory Visit: Payer: Medicaid Other | Admitting: Adult Health

## 2016-08-28 ENCOUNTER — Ambulatory Visit: Payer: Medicaid Other | Admitting: Women's Health

## 2016-08-31 ENCOUNTER — Other Ambulatory Visit: Payer: Self-pay | Admitting: Medical

## 2016-09-08 ENCOUNTER — Ambulatory Visit: Payer: Medicaid Other | Admitting: Obstetrics and Gynecology

## 2016-09-17 ENCOUNTER — Encounter: Payer: Self-pay | Admitting: Obstetrics and Gynecology

## 2016-09-17 ENCOUNTER — Ambulatory Visit (INDEPENDENT_AMBULATORY_CARE_PROVIDER_SITE_OTHER): Payer: Medicaid Other | Admitting: Obstetrics and Gynecology

## 2016-09-17 VITALS — BP 124/82 | HR 72 | Wt 197.0 lb

## 2016-09-17 DIAGNOSIS — N760 Acute vaginitis: Secondary | ICD-10-CM | POA: Diagnosis not present

## 2016-09-17 DIAGNOSIS — B9689 Other specified bacterial agents as the cause of diseases classified elsewhere: Secondary | ICD-10-CM | POA: Diagnosis not present

## 2016-09-17 DIAGNOSIS — N898 Other specified noninflammatory disorders of vagina: Secondary | ICD-10-CM | POA: Diagnosis not present

## 2016-09-17 LAB — POCT WET PREP (WET MOUNT): Trichomonas Wet Prep HPF POC: ABSENT

## 2016-09-17 MED ORDER — TINIDAZOLE 500 MG PO TABS: 500.0000 mg | ORAL_TABLET | Freq: Two times a day (BID) | ORAL | 1 refills | Status: DC

## 2016-09-17 NOTE — Progress Notes (Signed)
[facility] Visit  09/17/2016          Patient name: Emily [name] MRN [id number]  Date of birth: 1980/06/25  CC & HPI:  Emily [name] is a 36 y.o. female presenting today for vaginal discharge x a few days. She reports h/o similar discharge in June 2017 at that time she was treated for recurrent trichomonas with Tinidazole. Pt reports recent new sexual partner and notes that she has stopped using protection with him but believes if she has an STD it would've been contracted from her previous partner. She reports associated vaginal pain. No alleviating factors noted.   ROS:  ROS Otherwise negative for acute change except as noted in the HPI.  Pertinent History Reviewed:   Reviewed: Medical         Past Medical History:  Diagnosis Date   Abnormal CT of liver    Anxiety    Asthma    Asthma    Bipolar 1 disorder (HCC)    Bipolar disorder (HCC)    Chronic pain    Depression    Fatty liver    GERD (gastroesophageal reflux disease)    Headache(784.0)    Hyperlipidemia    Hypertension    Pulmonary nodule    Tachycardia    Thyroid disease                               Surgical Hx:    Past Surgical History:  Procedure Laterality Date   DILATION AND CURETTAGE OF UTERUS     1 yr ago   Medications: Reviewed & Updated - see associated section                       Current Outpatient Prescriptions:    ACCU-CHEK FASTCLIX LANCETS MISC, USE AS DIRECTED, Disp: 102 each, Rfl: 3   albuterol (PROVENTIL HFA;VENTOLIN HFA) 108 (90 Base) MCG/ACT inhaler, Inhale 2 puffs into the lungs every 4 (four) hours as needed., Disp: 18 g, Rfl: 1   amLODipine (NORVASC) 10 MG tablet, TAKE 1 TABLET (10 MG TOTAL) BY MOUTH DAILY., Disp: 90 tablet, Rfl: 1   atorvastatin (LIPITOR) 10 MG tablet, Take 1 tablet (10 mg total) by mouth daily., Disp: 90 tablet, Rfl: 1   Blood Glucose Monitoring Suppl (ACCU-CHEK AVIVA PLUS) w/Device KIT, USE AS DIRECTED, Disp: 1 kit, Rfl: 0    budesonide-formoterol (SYMBICORT) 160-4.5 MCG/ACT inhaler, Inhale 2 puffs into the lungs. Reported on 06/18/2015, Disp: , Rfl:    JANUMET 50-1000 MG tablet, TAKE 1 TABLET BY MOUTH TWICE A DAY WITH A MEAL, Disp: 180 tablet, Rfl: 0   lamoTRIgine (LAMICTAL) 150 MG tablet, Take 150 mg by mouth daily.  , Disp: , Rfl:    levothyroxine (SYNTHROID, LEVOTHROID) 75 MCG tablet, Take 1 tablet (75 mcg total) by mouth daily before breakfast., Disp: 90 tablet, Rfl: 3   montelukast (SINGULAIR) 10 MG tablet, Take 1 tablet (10 mg total) by mouth at bedtime., Disp: 90 tablet, Rfl: 3   paroxetine mesylate (PEXEVA) 20 MG tablet, Take 20 mg by mouth daily.  , Disp: , Rfl:    propranolol (INDERAL) 20 MG tablet, Take 1 tablet (20 mg total) by mouth 2 (two) times daily., Disp: 180 tablet, Rfl: 1   tinidazole (TINDAMAX) 500 MG tablet, Take 4 tablets (2,000 mg total) by mouth daily with breakfast., Disp: 4 tablet, Rfl: 1   ziprasidone (GEODON)  40 MG capsule, Take 40 mg by mouth 2 (two) times daily with a meal.  , Disp: , Rfl:    Social History: Reviewed -  reports that she has been smoking.  She has been smoking about 1.00 pack per day. She has never used smokeless tobacco.  Objective Findings:  Vitals: There were no vitals taken for this visit.  Physical Examination: General appearance - alert, well appearing, and in no distress Mental status - alert, oriented to person, place, and time Pelvic - VULVA: normal appearing vulva with no masses, tenderness or lesions,  VAGINA: vaginal discharge - copious and creamy,  CERVIX: Cervix with yellow discharge, appears irritated and inflamed   Assessment & Plan:   A:  1. BV  P:  1. Will treat with Tindamax bid  X 7d partener here, will co- tx due to recent tx trich.  By signing my name below, I, [name], attest that this documentation has been prepared under the direction and in the presence of Jonnie Kind, MD . Electronically Signed: [name],  Scribe. 09/17/2016. 4:02 PM. I personally performed the services described in this documentation, which was SCRIBED in my presence. The recorded information has been reviewed and considered accurate. It has been edited as necessary during review. Jonnie Kind, MD

## 2016-09-17 NOTE — Addendum Note (Signed)
Addended by: Tish FredericksonLANCASTER, Sabina Beavers A on: 09/17/2016 04:37 PM   Modules accepted: Orders

## 2016-09-18 ENCOUNTER — Telehealth: Payer: Self-pay | Admitting: Obstetrics and Gynecology

## 2016-09-18 NOTE — Telephone Encounter (Signed)
Pt called stating that she seen Dr. Emelda FearFerguson yesterday and she was suppose to get an antibiotic and pt states that the pharmacy don't have it. Please contact pt

## 2016-09-18 NOTE — Telephone Encounter (Signed)
Informed PA was done and approved. Pharmacy called and claim refilled.

## 2016-09-19 LAB — GC/CHLAMYDIA PROBE AMP
Chlamydia trachomatis, NAA: NEGATIVE
Neisseria gonorrhoeae by PCR: NEGATIVE

## 2016-09-25 ENCOUNTER — Telehealth: Payer: Self-pay | Admitting: *Deleted

## 2016-09-25 NOTE — Telephone Encounter (Signed)
Patient called stating she is still having the same symptoms as before when she had BV. She took all the medication as prescribed. Informed patient that she would need to be seen to be re-evaluated. Will call back in the am if she can get a ride to come.

## 2016-09-30 ENCOUNTER — Ambulatory Visit: Payer: Self-pay | Admitting: Obstetrics & Gynecology

## 2016-09-30 ENCOUNTER — Telehealth: Payer: Self-pay | Admitting: Obstetrics & Gynecology

## 2016-09-30 NOTE — Telephone Encounter (Signed)
Patient states she has had continued abdominal and vaginal pain for 3 days and wants to be re-evaluated. Will get in to see Dr Despina HiddenEure today.

## 2016-10-01 ENCOUNTER — Ambulatory Visit: Payer: Medicaid Other | Admitting: Obstetrics and Gynecology

## 2016-10-06 ENCOUNTER — Telehealth: Payer: Self-pay | Admitting: Obstetrics and Gynecology

## 2016-10-06 ENCOUNTER — Telehealth: Payer: Self-pay | Admitting: *Deleted

## 2016-10-06 NOTE — Telephone Encounter (Signed)
Pt called stating that she was having pain on her left and right side. She stated that she had been treated for BV but didn't feel like it had cleared up. She stated that her period was also 5 days late. I asked pt if she had taken a pregnancy test and she stated that she had not. I advised pt that if she could schedule an appt for follow up on the BV if she felt like that was needed. Pt requested a pregnancy test be done at the same time. Connected to appts.

## 2016-10-08 ENCOUNTER — Ambulatory Visit (INDEPENDENT_AMBULATORY_CARE_PROVIDER_SITE_OTHER): Payer: Medicaid Other | Admitting: Adult Health

## 2016-10-08 ENCOUNTER — Encounter: Payer: Self-pay | Admitting: Adult Health

## 2016-10-08 VITALS — BP 120/82 | HR 83 | Ht 64.0 in | Wt 197.0 lb

## 2016-10-08 DIAGNOSIS — Z3202 Encounter for pregnancy test, result negative: Secondary | ICD-10-CM | POA: Diagnosis not present

## 2016-10-08 DIAGNOSIS — N898 Other specified noninflammatory disorders of vagina: Secondary | ICD-10-CM | POA: Insufficient documentation

## 2016-10-08 DIAGNOSIS — R1032 Left lower quadrant pain: Secondary | ICD-10-CM | POA: Diagnosis not present

## 2016-10-08 DIAGNOSIS — R1011 Right upper quadrant pain: Secondary | ICD-10-CM | POA: Diagnosis not present

## 2016-10-08 DIAGNOSIS — N926 Irregular menstruation, unspecified: Secondary | ICD-10-CM | POA: Insufficient documentation

## 2016-10-08 LAB — POCT URINE PREGNANCY: Preg Test, Ur: NEGATIVE

## 2016-10-08 LAB — POCT WET PREP (WET MOUNT): WBC, Wet Prep HPF POC: POSITIVE

## 2016-10-08 MED ORDER — VALACYCLOVIR HCL 1 G PO TABS: 1000.0000 mg | ORAL_TABLET | Freq: Two times a day (BID) | ORAL | 1 refills | Status: DC

## 2016-10-08 NOTE — Progress Notes (Signed)
Subjective:     Patient ID: Emily [name], female   DOB: Nov 27, 1980, 36 y.o.   MRN: [id number]  HPI Emily HuskyCassandra is a 36 year old white female in complaining of vaginal discharge and pain in RUQ and LLQ x 1 week,and period is late.  Review of Systems Vaginal discharge Pain LLQ x 1 week Pain in RUQ x 1 week Period late Reviewed past medical,surgical, social and family history. Reviewed medications and allergies.     Objective:   Physical Exam BP 120/82 (BP Location: Right Arm, Patient Position: Sitting, Cuff Size: Normal)   Pulse 83   Ht 5\' 4"  (1.626 m)   Wt 197 lb (89.4 kg)   LMP 09/05/2016   BMI 33.81 kg/m   UPT is negative. Skin warm and dry.Pelvic: external genitalia is normal in appearance no lesions, vagina: creamy discharge without odor,urethra has no lesions or masses noted, cervix is red and irritated and has ?vesicles, HSV culture obtained and she says vagina sore with exam, uterus: normal size, shape and contour, non tender, no masses felt, adnexa: no masses, LLQ tenderness noted. Bladder is non tender and no masses felt. Wet prep:  +WBCs.Pain RUQ, no masses felt GC/CHL obtained.     Assessment:     1. Vaginal discharge   2. LLQ pain   3. RUQ pain   4. Vaginal irritation   5. Menstrual period late   6. Pregnancy examination or test, negative result       Plan:     Meds ordered this encounter  Medications  . valACYclovir (VALTREX) 1000 MG tablet    Sig: Take 1 tablet (1,000 mg total) by mouth 2 (two) times daily.    Dispense:  20 tablet    Refill:  1    Order Specific Question:   Supervising Provider    Answer:   Lazaro ArmsEURE, LUTHER H [[id number]]  HSV sent GC/CHL sent No sex for now  [facility]  Follow up with me in about 1 week

## 2016-10-10 LAB — GC/CHLAMYDIA PROBE AMP
Chlamydia trachomatis, NAA: NEGATIVE
Neisseria gonorrhoeae by PCR: NEGATIVE

## 2016-10-11 LAB — HERPES SIMPLEX VIRUS CULTURE

## 2016-10-13 ENCOUNTER — Ambulatory Visit (HOSPITAL_COMMUNITY)
Admission: RE | Admit: 2016-10-13 | Discharge: 2016-10-13 | Disposition: A | Discharge status: Home / Self Care | Payer: Medicaid Other | Source: Ambulatory Visit | Attending: Adult Health | Admitting: Adult Health

## 2016-10-13 DIAGNOSIS — K760 Fatty (change of) liver, not elsewhere classified: Secondary | ICD-10-CM | POA: Insufficient documentation

## 2016-10-13 DIAGNOSIS — R1011 Right upper quadrant pain: Secondary | ICD-10-CM

## 2016-10-13 DIAGNOSIS — R1032 Left lower quadrant pain: Secondary | ICD-10-CM

## 2016-10-14 ENCOUNTER — Telehealth: Payer: Self-pay | Admitting: *Deleted

## 2016-10-14 NOTE — Telephone Encounter (Signed)
Informed patient of negative lab results.

## 2016-10-15 ENCOUNTER — Telehealth: Payer: Self-pay | Admitting: *Deleted

## 2016-10-15 NOTE — Telephone Encounter (Signed)
LMOVM that results from u/s would be discussed with pt at her appt tomorrow.

## 2016-10-16 ENCOUNTER — Ambulatory Visit: Payer: Medicaid Other | Admitting: Adult Health

## 2016-10-17 ENCOUNTER — Telehealth: Payer: Self-pay | Admitting: Adult Health

## 2016-10-17 ENCOUNTER — Ambulatory Visit: Payer: Medicaid Other | Admitting: Adult Health

## 2016-10-17 NOTE — Telephone Encounter (Signed)
Left message that HSV culture negative and GC/chlamydia both negative and on US has fatty live, which had in the past, F/U with GI, Dr Karilyn Cotaehman or Camelia Engerri, and that on pelvic US could not see uterus are ovaries due to gas

## 2016-10-20 ENCOUNTER — Telehealth: Payer: Self-pay | Admitting: Adult Health

## 2016-10-20 NOTE — Telephone Encounter (Signed)
Pt called stating that she would like for Victorino DikeJennifer to give her a referral to see a doctor for her liver. Please contact pt

## 2016-10-20 NOTE — Telephone Encounter (Addendum)
Left message letting pt know has seen Dr. Karilyn Cotaehman and Dorene Arerri Setzer in the past. Can call their office and make appt. JSY

## 2016-12-30 ENCOUNTER — Telehealth: Payer: Self-pay | Admitting: Adult Health

## 2016-12-30 NOTE — Telephone Encounter (Signed)
Spoke with pt. Pt thinks she feels a cyst in her vagina. I advised she needs to be seen. Pt also mentioned having shortness of breath and strong pulses in her leg and feet. No chest pain per pt. I advised she needs to be seen today for the shortness of breath. Pt to call PCP and see them today or Urgent Care or ER. Pt voiced understanding. Pt will call back and schedule an appt here. JSY

## 2016-12-30 NOTE — Telephone Encounter (Signed)
Left message @ 12:54 pm. JSY 

## 2017-01-06 ENCOUNTER — Ambulatory Visit (INDEPENDENT_AMBULATORY_CARE_PROVIDER_SITE_OTHER): Payer: Medicaid Other | Admitting: Adult Health

## 2017-01-06 ENCOUNTER — Encounter: Payer: Self-pay | Admitting: Adult Health

## 2017-01-06 VITALS — BP 100/70 | HR 78 | Ht 63.0 in | Wt 196.4 lb

## 2017-01-06 DIAGNOSIS — N898 Other specified noninflammatory disorders of vagina: Secondary | ICD-10-CM | POA: Diagnosis not present

## 2017-01-06 DIAGNOSIS — A5901 Trichomonal vulvovaginitis: Secondary | ICD-10-CM

## 2017-01-06 DIAGNOSIS — R599 Enlarged lymph nodes, unspecified: Secondary | ICD-10-CM | POA: Diagnosis not present

## 2017-01-06 DIAGNOSIS — N750 Cyst of Bartholin's gland: Secondary | ICD-10-CM | POA: Diagnosis not present

## 2017-01-06 DIAGNOSIS — Z113 Encounter for screening for infections with a predominantly sexual mode of transmission: Secondary | ICD-10-CM | POA: Diagnosis not present

## 2017-01-06 LAB — POCT WET PREP (WET MOUNT)
Clue Cells Wet Prep Whiff POC: POSITIVE
WBC, Wet Prep HPF POC: POSITIVE

## 2017-01-06 MED ORDER — TINIDAZOLE 500 MG PO TABS
2.0000 g | ORAL_TABLET | Freq: Every day | ORAL | 0 refills | Status: DC
Start: 2017-01-06 — End: 2017-01-16

## 2017-01-06 MED ORDER — SULFAMETHOXAZOLE-TRIMETHOPRIM 800-160 MG PO TABS: 1.0000 | ORAL_TABLET | Freq: Two times a day (BID) | ORAL | 0 refills | Status: DC

## 2017-01-06 NOTE — Patient Instructions (Signed)
No alcohol  No sex F/U in 10 days

## 2017-01-06 NOTE — Progress Notes (Signed)
Subjective:     Patient ID: Emily [name], female   DOB: 22-Feb-1981, 36 y.o.   MRN: [id number]  HPI Emily [name] is a 36 year old white female in complaining of bump in vagina and tenderness in groin.   Review of Systems Bump in vagina Tenderness in groin  Reviewed past medical,surgical, social and family history. Reviewed medications and allergies.     Objective:   Physical Exam BP 100/70 (BP Location: Left Arm, Patient Position: Sitting, Cuff Size: Small)   Pulse 78   Ht  (1.6 m)   Wt 196 lb 6.4 oz (89.1 kg)   LMP 12/24/2016   BMI 34.79 kg/m  Skin warm and dry.Pelvic: external genitalia is normal in appearance, bartholin cyst, non inflamed on the right, and has swollen lymph node in left groin that is tender, vagina: tan frothy, discharge with odor,urethra has no lesions or masses noted, cervix:red and irritated, uterus: normal size, shape and contour, non tender, no masses felt, adnexa: no masses or tenderness noted. Bladder is non tender and no masses felt. Wet prep: ++trich  and +WBCs. GC/CHL obtained.    PHQ 9 score 4, denies being suicidal. Treated partner Emily [name] for trich with flagyl,500 mg 4 po now.   Assessment:     1. Cyst of right Bartholin's gland   2. Trichomonal vaginitis   3. Vaginal discharge   4. Swollen lymph nodes   5. Screening examination for STD (sexually transmitted disease)       Plan:     She has taken tindamax before with no problem.  Meds ordered this encounter  Medications  . tinidazole (TINDAMAX) 500 MG tablet    Sig: Take 4 tablets (2,000 mg total) by mouth daily with breakfast.    Dispense:  4 tablet    Refill:  0    Order Specific Question:   Supervising Provider    Answer:   EURE, LUTHER H [[id number]]  . sulfamethoxazole-trimethoprim (BACTRIM DS,SEPTRA DS) 800-160 MG tablet    Sig: Take 1 tablet by mouth 2 (two) times daily.    Dispense:  28 tablet    Refill:  0    Order Specific Question:   Supervising Provider    Answer:    Lazaro Arms [[id number]]  GC/CHL sent Check HIV and RPR   No sex No alcohol F/U in 10 days for POC trich and recheck Bartholin cyst Review handout on trich

## 2017-01-07 ENCOUNTER — Telehealth: Payer: Self-pay | Admitting: *Deleted

## 2017-01-07 NOTE — Telephone Encounter (Signed)
Informed pharmacy PA was done and approved for Tindamax.

## 2017-01-08 LAB — GC/CHLAMYDIA PROBE AMP
Chlamydia trachomatis, NAA: NEGATIVE
Neisseria gonorrhoeae by PCR: NEGATIVE

## 2017-01-16 ENCOUNTER — Ambulatory Visit (INDEPENDENT_AMBULATORY_CARE_PROVIDER_SITE_OTHER): Payer: Medicaid Other | Admitting: Adult Health

## 2017-01-16 ENCOUNTER — Encounter: Payer: Self-pay | Admitting: Adult Health

## 2017-01-16 VITALS — BP 110/84 | HR 88 | Ht 63.0 in | Wt 193.5 lb

## 2017-01-16 DIAGNOSIS — Z8619 Personal history of other infectious and parasitic diseases: Secondary | ICD-10-CM | POA: Diagnosis not present

## 2017-01-16 DIAGNOSIS — N750 Cyst of Bartholin's gland: Secondary | ICD-10-CM

## 2017-01-16 LAB — POCT WET PREP (WET MOUNT)
Trichomonas Wet Prep HPF POC: ABSENT
WBC, Wet Prep HPF POC: POSITIVE

## 2017-01-16 NOTE — Patient Instructions (Signed)
Use condoms F/U prn  

## 2017-01-16 NOTE — Progress Notes (Signed)
Subjective:     Patient ID: Emily [name], female   DOB: 1980/11/12, 36 y.o.   MRN: [id number]  HPI Emily [name] is a 36 year old white female in for proof of cure for recent trich and recheck bartholin cyst.Took meds and has not had sex, had to stop septra ds had chest tightness.   Review of Systems Discharge better Bartholin Cyst does not hurt now Reviewed past medical,surgical, social and family history. Reviewed medications and allergies.     Objective:   Physical Exam BP 110/84 (BP Location: Left Arm, Patient Position: Sitting, Cuff Size: Normal)   Pulse 88   Ht  (1.6 m)   Wt 193 lb 8 oz (87.8 kg)   LMP 12/24/2016   BMI 34.28 kg/m   Skin warm and dry.Pelvic: external genitalia is normal in appearance no lesions, vagina: white discharge without odor, and bartholin cyst on right is smaller and non tender,urethra has no lesions or masses noted, cervix:smooth and bulbous, uterus: normal size, shape and contour, non tender, no masses felt, adnexa: no masses or tenderness noted. Bladder is non tender and no masses felt. Wet prep: +WBCs, no trich seen.    Assessment:     1. History of trichomoniasis   2. Cyst of right Bartholin's gland       Plan:     Use condoms F/U prn

## 2017-01-21 ENCOUNTER — Telehealth: Payer: Self-pay | Admitting: Adult Health

## 2017-01-21 MED ORDER — TINIDAZOLE 500 MG PO TABS
2.0000 g | ORAL_TABLET | Freq: Every day | ORAL | 0 refills | Status: DC
Start: 2017-01-21 — End: 2017-02-04

## 2017-01-21 NOTE — Telephone Encounter (Signed)
Pt aware tindamax sent to [facility]

## 2017-01-21 NOTE — Telephone Encounter (Signed)
Patient called stating that she would like some medication called inTindamax.for her , Pt states that she had sex to early and has it again. Pt states that [facility] uses [facility] in [place].

## 2017-01-28 ENCOUNTER — Telehealth: Payer: Self-pay | Admitting: Adult Health

## 2017-01-28 NOTE — Telephone Encounter (Signed)
Pt called requesting a refill on a medication for acne that she says Victorino Dike prescribed her. Informed pt that upon reviewing her chart, there was not an acne medication prescribed by Victorino Dike. Advised pt that Victorino Dike doesn't usually treat acne and she should see a dermatologist or her PCP. Pt also states that she thinks her trich infection has come back and requests that medication be sent in for that. She states she has a green discharge. Informed pt that I would check with Victorino Dike and call her back.   Tried to call her back to inform her she would need an appt but didn't get an answer.

## 2017-01-28 NOTE — Telephone Encounter (Signed)
Pt called stating that she would like a medication called into her pharmacy that Victorino Dike has prescribed her for her acne a little while ago. Please contact pt

## 2017-02-02 ENCOUNTER — Telehealth: Payer: Self-pay | Admitting: Obstetrics & Gynecology

## 2017-02-02 ENCOUNTER — Encounter: Payer: Self-pay | Admitting: *Deleted

## 2017-02-02 NOTE — Telephone Encounter (Signed)
Mychart message sent.

## 2017-02-04 ENCOUNTER — Encounter: Payer: Self-pay | Admitting: Adult Health

## 2017-02-04 ENCOUNTER — Ambulatory Visit (INDEPENDENT_AMBULATORY_CARE_PROVIDER_SITE_OTHER): Payer: Medicaid Other | Admitting: Adult Health

## 2017-02-04 VITALS — BP 112/78 | HR 80 | Ht 63.0 in | Wt 198.0 lb

## 2017-02-04 DIAGNOSIS — B9689 Other specified bacterial agents as the cause of diseases classified elsewhere: Secondary | ICD-10-CM | POA: Diagnosis not present

## 2017-02-04 DIAGNOSIS — N760 Acute vaginitis: Secondary | ICD-10-CM

## 2017-02-04 DIAGNOSIS — N644 Mastodynia: Secondary | ICD-10-CM

## 2017-02-04 DIAGNOSIS — N898 Other specified noninflammatory disorders of vagina: Secondary | ICD-10-CM

## 2017-02-04 DIAGNOSIS — Z3202 Encounter for pregnancy test, result negative: Secondary | ICD-10-CM

## 2017-02-04 LAB — POCT WET PREP (WET MOUNT)
Clue Cells Wet Prep Whiff POC: POSITIVE
WBC, Wet Prep HPF POC: POSITIVE

## 2017-02-04 LAB — POCT URINE PREGNANCY: Preg Test, Ur: NEGATIVE

## 2017-02-04 MED ORDER — METRONIDAZOLE 0.75 % VA GEL: 1.0000 | Freq: Two times a day (BID) | VAGINAL | 0 refills | Status: DC

## 2017-02-04 NOTE — Progress Notes (Signed)
Subjective:     Patient ID: Emily [name], female   DOB: 08/23/1980, 36 y.o.   MRN: [id number]  HPI [name] is a 36 year old white female in complaining of green vaginal discharge and has breast tenderness.   Review of Systems +vaginal discharge +breast tenderness Reviewed past medical,surgical, social and family history. Reviewed medications and allergies.     Objective:   Physical Exam BP 112/78 (BP Location: Left Arm, Patient Position: Sitting, Cuff Size: Normal)   Pulse 80   Ht 5\' 3"  (1.6 m)   Wt 198 lb (89.8 kg)   LMP 01/18/2017   BMI 35.07 kg/m   UPT negative. Skin warm and dry,  Breasts:no dominate palpable mass, retraction or nipple discharge Pelvic: external genitalia is normal in appearance no lesions, vagina: greenish discharge with odor,urethra has no lesions or masses noted, cervix is everted and bulbous, uterus: normal size, shape and contour, non tender, no masses felt, adnexa: no masses or tenderness noted. Bladder is non tender and no masses felt. Wet prep: + for clue cells and +WBCs. She declines GC/CHL.    Assessment:     1. Vaginal discharge   2. BV (bacterial vaginosis)   3. Pregnancy examination or test, negative result   4. Breast tenderness       Plan:     Meds ordered this encounter  Medications  . metroNIDAZOLE (METROGEL) 0.75 % vaginal gel    Sig: Place 1 Applicatorful vaginally 2 (two) times daily.    Dispense:  70 g    Refill:  0    Order Specific Question:   Supervising Provider    Answer:   Lazaro ArmsEURE, LUTHER H [[id number]]  Use condoms Follow up prn

## 2017-02-10 ENCOUNTER — Ambulatory Visit: Payer: Medicaid Other | Admitting: Adult Health

## 2017-02-10 ENCOUNTER — Telehealth: Payer: Self-pay | Admitting: Adult Health

## 2017-02-10 NOTE — Telephone Encounter (Signed)
Pt called stating that she started having breast soreness and soreness in her armpit last night. She states that her period should start at the end of next week. Advised pt that those could be cyclic s/s. She states that this pain is different and she has never hurt in her armpit before. Advised pt to decrease caffeine intake, not to use deodorant, or shave and see if s/s improve over the next few days. Advised to call the beginning of next week and make an appt if s/s dont improve. Pt verbalized understanding.

## 2017-03-02 ENCOUNTER — Other Ambulatory Visit: Payer: Self-pay | Admitting: Medical

## 2017-04-10 ENCOUNTER — Telehealth: Payer: Self-pay | Admitting: *Deleted

## 2017-04-10 MED ORDER — TINIDAZOLE 500 MG PO TABS: ORAL_TABLET | ORAL | 0 refills | Status: DC

## 2017-04-10 NOTE — Telephone Encounter (Signed)
Patient called stating that since last night she has had dark brown bleeding with light pink spotting when using the bathroom along with pain on both sides of her lower abdomin. She has an odor as well. Informed patient you do not have any availability today. Please advise.

## 2017-04-10 NOTE — Telephone Encounter (Signed)
Pt complains of pain in sides, and vaginal spotting, not on her period it was 2 weeks ago she says  and +odor, has had trich in past , no appts available, will rx tindamax, if pain increases Go to ER, or make appt for next week.

## 2017-04-16 ENCOUNTER — Telehealth: Payer: Self-pay | Admitting: *Deleted

## 2017-04-16 NOTE — Telephone Encounter (Signed)
Informed PA was not approved yet at this time but would check again tomorrow morning. Verbalized understanding.

## 2017-04-17 ENCOUNTER — Telehealth: Payer: Self-pay | Admitting: *Deleted

## 2017-04-17 NOTE — Telephone Encounter (Signed)
Patient and pharmacy informed Tindimax was approved.

## 2017-05-29 ENCOUNTER — Ambulatory Visit (HOSPITAL_COMMUNITY)
Admission: RE | Admit: 2017-05-29 | Discharge: 2017-05-29 | Disposition: A | Discharge status: Home / Self Care | Payer: Medicaid Other | Source: Ambulatory Visit | Attending: Physician Assistant | Admitting: Physician Assistant

## 2017-05-29 ENCOUNTER — Other Ambulatory Visit (HOSPITAL_COMMUNITY): Payer: Self-pay | Admitting: Physician Assistant

## 2017-05-29 DIAGNOSIS — M25461 Effusion, right knee: Secondary | ICD-10-CM

## 2017-05-29 DIAGNOSIS — M1711 Unilateral primary osteoarthritis, right knee: Secondary | ICD-10-CM | POA: Insufficient documentation

## 2017-05-29 DIAGNOSIS — M25561 Pain in right knee: Secondary | ICD-10-CM | POA: Insufficient documentation

## 2017-08-22 ENCOUNTER — Encounter: Payer: Self-pay | Admitting: Adult Health

## 2017-09-02 ENCOUNTER — Ambulatory Visit: Payer: Medicaid Other | Admitting: Adult Health

## 2017-09-16 ENCOUNTER — Telehealth: Payer: Self-pay | Admitting: Adult Health

## 2017-09-16 NOTE — Telephone Encounter (Signed)
Pt called stating that she was having some pain. She states that she was trying to have sex and she couldn't because something was in the way and it was painful. Advised pt to make an appt for evaluation by a provider.

## 2017-09-16 NOTE — Telephone Encounter (Signed)
Pt would like for a nurse to give her a call °

## 2017-09-28 ENCOUNTER — Ambulatory Visit: Payer: Medicaid Other | Admitting: Women's Health

## 2017-09-30 ENCOUNTER — Other Ambulatory Visit: Payer: Self-pay

## 2017-09-30 ENCOUNTER — Encounter: Payer: Self-pay | Admitting: Adult Health

## 2017-09-30 ENCOUNTER — Ambulatory Visit: Payer: Medicaid Other | Admitting: Adult Health

## 2017-09-30 VITALS — BP 135/88 | HR 85 | Ht 63.0 in | Wt 212.0 lb

## 2017-09-30 DIAGNOSIS — N898 Other specified noninflammatory disorders of vagina: Secondary | ICD-10-CM

## 2017-09-30 DIAGNOSIS — Z113 Encounter for screening for infections with a predominantly sexual mode of transmission: Secondary | ICD-10-CM

## 2017-09-30 DIAGNOSIS — N750 Cyst of Bartholin's gland: Secondary | ICD-10-CM

## 2017-09-30 DIAGNOSIS — N760 Acute vaginitis: Secondary | ICD-10-CM

## 2017-09-30 DIAGNOSIS — B9689 Other specified bacterial agents as the cause of diseases classified elsewhere: Secondary | ICD-10-CM | POA: Diagnosis not present

## 2017-09-30 LAB — POCT WET PREP (WET MOUNT)
Clue Cells Wet Prep Whiff POC: NEGATIVE
WBC, Wet Prep HPF POC: POSITIVE

## 2017-09-30 MED ORDER — CLINDAMYCIN HCL 150 MG PO CAPS: 150.0000 mg | ORAL_CAPSULE | Freq: Three times a day (TID) | ORAL | 1 refills | Status: DC

## 2017-09-30 NOTE — Progress Notes (Signed)
  Subjective:     Patient ID: Emily [name], female   DOB: 13-Sep-1980, 37 y.o.   MRN: [id number]  HPI Emily [name] is a 37 year old white female in complaining of vaginal discharge with odor, and sex hurts feels like something in vagina.   Review of Systems +vaginal discharge with odor Sex hurts feels like, something in vagina Reviewed past medical,surgical, social and family history. Reviewed medications and allergies.     Objective:   Physical Exam BP 135/88 (BP Location: Right Arm, Patient Position: Sitting, Cuff Size: Normal)   Pulse 85   Ht 5\' 3"  (1.6 m)   Wt 212 lb (96.2 kg)   LMP 09/13/2017   BMI 37.55 kg/m  Skin warm and dry.Pelvic: external genitalia is normal in appearance no lesions, vagina: white discharge without odor,right bartholin cyst,red and tender,urethra has no lesions or masses noted, cervix:smooth, uterus: normal size, shape and contour, non tender, no masses felt, adnexa: no masses or tenderness noted. Bladder is non tender and no masses felt. Wet prep: + for clue cells and +WBCs. GC/CHL obtained.     Assessment:     1. Vaginal discharge   2. Vaginal odor   3. BV (bacterial vaginosis)   4. Cyst of right Bartholin's gland   5. Screening examination for STD (sexually transmitted disease)       Plan:    GC/CHL sent Meds ordered this encounter  Medications  . clindamycin (CLEOCIN) 150 MG capsule    Sig: Take 1 capsule (150 mg total) by mouth 3 (three) times daily.    Dispense:  21 capsule    Refill:  1    Order Specific Question:   Supervising Provider    Answer:   Despina HiddenEURE, LUTHER H [[id number]]  F/U in about 2 weeks, if bartholin cyst not better, may do I&D

## 2017-10-03 LAB — GC/CHLAMYDIA PROBE AMP
Chlamydia trachomatis, NAA: NEGATIVE
Neisseria gonorrhoeae by PCR: NEGATIVE

## 2017-10-05 ENCOUNTER — Telehealth: Payer: Self-pay | Admitting: Adult Health

## 2017-10-05 MED ORDER — CLINDAMYCIN PHOSPHATE 2 % VA CREA: 1.0000 | TOPICAL_CREAM | Freq: Every day | VAGINAL | 0 refills | Status: DC

## 2017-10-05 NOTE — Telephone Encounter (Signed)
Pt called wants cleocin vaginal cream, pill makes her feel bad, will do

## 2017-10-05 NOTE — Telephone Encounter (Signed)
WOULD LIKE FOR Emily [name] TO GIVE HER A CALL

## 2017-10-06 ENCOUNTER — Telehealth: Payer: Self-pay | Admitting: *Deleted

## 2017-10-06 NOTE — Telephone Encounter (Signed)
PA entered for cream

## 2017-10-13 ENCOUNTER — Telehealth: Payer: Self-pay | Admitting: Adult Health

## 2017-10-13 NOTE — Telephone Encounter (Signed)
Emily [name] called back and said insurance did cover cream, and she got it and is using it, has noticed white bump on labia, will check at F/U

## 2017-10-13 NOTE — Telephone Encounter (Signed)
Left message insurance not covering cream, try pills again and eat first

## 2017-10-14 ENCOUNTER — Ambulatory Visit: Payer: Medicaid Other | Admitting: Adult Health

## 2017-10-20 ENCOUNTER — Telehealth: Payer: Self-pay | Admitting: Adult Health

## 2017-10-20 NOTE — Telephone Encounter (Signed)
Pt still spotting, has appt tomorrow she says

## 2017-10-21 ENCOUNTER — Ambulatory Visit: Payer: Medicaid Other | Admitting: Adult Health

## 2017-10-21 ENCOUNTER — Encounter: Payer: Self-pay | Admitting: Adult Health

## 2017-10-21 VITALS — BP 135/91 | HR 76 | Ht 63.0 in | Wt 211.0 lb

## 2017-10-21 DIAGNOSIS — N898 Other specified noninflammatory disorders of vagina: Secondary | ICD-10-CM

## 2017-10-21 DIAGNOSIS — N750 Cyst of Bartholin's gland: Secondary | ICD-10-CM

## 2017-10-21 LAB — POCT WET PREP (WET MOUNT)
Clue Cells Wet Prep Whiff POC: NEGATIVE
WBC, Wet Prep HPF POC: POSITIVE

## 2017-10-21 NOTE — Patient Instructions (Signed)
Bartholin Cyst or Abscess A Bartholin cyst is a fluid-filled sac that forms on a Bartholin gland. Bartholin glands are small glands that are found in the folds of skin (labia) on the sides of the lower opening of the vagina. This type of cyst causes a bulge on the side of the vagina. A cyst that is not large or infected may not cause problems. However, if the fluid in the cyst becomes infected, the cyst can turn into an abscess. An abscess may cause discomfort or pain. Follow these instructions at home:  Take medicines only as told by your doctor.  If you were prescribed an antibiotic medicine, finish all of it even if you start to feel better.  Apply warm, wet compresses to the area or take warm, shallow baths that cover your pelvic area (sitz baths). Do this many times each day or as told by your doctor.  Do not squeeze the cyst. Do not apply heavy pressure to it.  Do not have sex until the cyst has gone away.  If your cyst or abscess was opened by your doctor, a small piece of gauze or a drain may have been placed in the area. That lets the cyst drain. Do not remove the gauze or the drain until your doctor tells you it is okay to do that.  Do not wear tampons. Wear feminine pads as needed for any fluid or blood.  Keep all follow-up visits as told by your doctor. This is important. Contact a doctor if:  Your pain, puffiness (swelling), or redness in the area of the cyst gets worse.  You have fluid or pus pus coming from the cyst.  You have a fever. This information is not intended to replace advice given to you by your health care provider. Make sure you discuss any questions you have with your health care provider. Document Released: 07/04/2008 Document Revised: 09/13/2015 Document Reviewed: 11/21/2013 Elsevier Interactive Patient Education  2018 Elsevier Inc.  

## 2017-10-21 NOTE — Progress Notes (Signed)
  Subjective:     Patient ID: Emily [name], female   DOB: [birth year], 37 y.o.   MRN: [id number]  HPI Emily [name] is a 37 year old white female, back in follow up of taking cleocin cream for BV, and still has vaginal discharge.   Review of Systems +vaginal discharge Reviewed past medical,surgical, social and family history. Reviewed medications and allergies.     Objective:   Physical Exam BP (!) 135/91 (BP Location: Left Arm, Patient Position: Sitting, Cuff Size: Large)   Pulse 76   Ht 5\' 3"  (1.6 m)   Wt 211 lb (95.7 kg)   LMP 10/08/2017 (Approximate)   BMI 37.38 kg/m  Skin warm and dry.Pelvic: external genitalia is normal in appearance,has a few sebaceous cysts at base of labia, vagina: creamy discharge without odor,right bartholin cyst,urethra has no lesions or masses noted, cervix:smooth, uterus: normal size, shape and contour, non tender, no masses felt, adnexa: no masses or tenderness noted. Bladder is non tender and no masses felt. Wet prep: +WBCs.    Assessment:     1. Vaginal discharge   2. Cyst of right Bartholin's gland       Plan:     Continue cleocin cream Return in about a week to see Dr Emelda FearFerguson and have bartholin cyst I&D'd or removed Review handout on bartholin cyst

## 2017-10-28 ENCOUNTER — Ambulatory Visit: Payer: Medicaid Other | Admitting: Obstetrics and Gynecology

## 2017-11-02 ENCOUNTER — Telehealth: Payer: Self-pay | Admitting: *Deleted

## 2017-11-02 NOTE — Telephone Encounter (Signed)
Returned patients call. She stated that she now has a bump on her labia on the other side than the bartholins. Advised to use warm compresses and keep area clean. Patient not having pain. Advised to keep upcoming appt and Dr. Emelda FearFerguson can evaluate at that time. Patient agreeable and had no other questions at this time.

## 2017-11-11 ENCOUNTER — Ambulatory Visit: Payer: Medicaid Other | Admitting: Obstetrics and Gynecology

## 2017-11-16 ENCOUNTER — Ambulatory Visit: Payer: Medicaid Other | Admitting: Obstetrics and Gynecology

## 2017-11-23 ENCOUNTER — Other Ambulatory Visit: Payer: Self-pay

## 2017-11-23 ENCOUNTER — Encounter: Payer: Self-pay | Admitting: Obstetrics and Gynecology

## 2017-11-23 ENCOUNTER — Ambulatory Visit: Payer: Medicaid Other | Admitting: Obstetrics and Gynecology

## 2017-11-23 VITALS — BP 131/87 | HR 73 | Ht 63.5 in | Wt 213.0 lb

## 2017-11-23 DIAGNOSIS — L708 Other acne: Secondary | ICD-10-CM

## 2017-11-23 DIAGNOSIS — N750 Cyst of Bartholin's gland: Secondary | ICD-10-CM

## 2017-11-23 MED ORDER — CLINDAMYCIN HCL 300 MG PO CAPS: 300.0000 mg | ORAL_CAPSULE | Freq: Three times a day (TID) | ORAL | 0 refills | Status: DC

## 2017-11-23 NOTE — Progress Notes (Signed)
Patient ID: Emily [name], female   DOB: 1980-12-05, 37 y.o.   MRN: [id number]   [facility] Visit  '@DATE'$ @            Patient name: Emily [name] MRN [id number]  Date of birth: [birth year]  CC & HPI:  Emily [name] is a 37 y.o. female presenting today for bartholians cyst but does not want it cut open. She had a hard knot and 7 smaller cysts that are giving her discomfort of the left side.  ROS:  ROS +bartholian cyst -fever -chills All systems are negative except as noted in the HPI and PMH.    Pertinent History Reviewed:   Reviewed: Significant for MRSA, Bartholians cyst Medical         Past Medical History:  Diagnosis Date  . Abnormal CT of liver   . Anxiety   . Asthma   . Asthma   . Bipolar 1 disorder ([place])   . Bipolar disorder ([place])   . Chronic pain   . Depression   . Fatty liver   . GERD (gastroesophageal reflux disease)   . Headache(784.0)   . Hyperlipidemia   . Hypertension   . Pulmonary nodule   . Tachycardia   . Thyroid disease                               Surgical Hx:    Past Surgical History:  Procedure Laterality Date  . DILATION AND CURETTAGE OF UTERUS     1 yr ago   Medications: Reviewed & Updated - see associated section                       Current Outpatient Medications:  .  ACCU-CHEK FASTCLIX LANCETS MISC, USE AS DIRECTED, Disp: 102 each, Rfl: 3 .  albuterol (PROVENTIL HFA;VENTOLIN HFA) 108 (90 Base) MCG/ACT inhaler, Inhale 2 puffs into the lungs every 4 (four) hours as needed., Disp: 18 g, Rfl: 1 .  amLODipine (NORVASC) 10 MG tablet, TAKE 1 TABLET (10 MG TOTAL) BY MOUTH DAILY., Disp: 90 tablet, Rfl: 1 .  Blood Glucose Monitoring Suppl (ACCU-CHEK AVIVA PLUS) w/Device KIT, USE AS DIRECTED, Disp: 1 kit, Rfl: 0 .  budesonide-formoterol (SYMBICORT) 160-4.5 MCG/ACT inhaler, Inhale 2 puffs into the lungs daily. Reported on 06/18/2015, Disp: , Rfl:  .  clindamycin (CLEOCIN) 2 % vaginal cream, Place 1 Applicatorful vaginally at  bedtime. For 7 days, Disp: 40 g, Rfl: 0 .  lamoTRIgine (LAMICTAL) 150 MG tablet, Take 150 mg by mouth daily.  , Disp: , Rfl:  .  levothyroxine (SYNTHROID, LEVOTHROID) 75 MCG tablet, Take 1 tablet (75 mcg total) by mouth daily before breakfast., Disp: 90 tablet, Rfl: 3 .  montelukast (SINGULAIR) 10 MG tablet, Take 1 tablet (10 mg total) by mouth at bedtime., Disp: 90 tablet, Rfl: 3 .  paroxetine mesylate (PEXEVA) 20 MG tablet, Take 20 mg by mouth daily.  , Disp: , Rfl:  .  propranolol (INDERAL) 20 MG tablet, Take 1 tablet (20 mg total) by mouth 2 (two) times daily., Disp: 180 tablet, Rfl: 1 .  ziprasidone (GEODON) 40 MG capsule, Take 60 mg by mouth daily. , Disp: , Rfl:    Social History: Reviewed -  reports that she has been smoking cigarettes.  She has a 19.00 pack-year smoking history. She has never used smokeless tobacco.  Objective Findings:  Vitals: Blood pressure 131/87, pulse  73, height 5' 3.5" (1.613 m), weight 213 lb (96.6 kg).  PHYSICAL EXAMINATION General appearance - alert, well appearing, and in no distress and oriented to person, place, and time Mental status - alert, oriented to person, place, and time, normal mood, behavior, speech, dress, motor activity, and thought processes, affect appropriate to mood  PELVIC External genitalia - swollen 3 cm right bartholian cyst, no redness minimal tenderness Vagina - mild.d/c 3 cm barth cyst Cervix - not examined Uterus - not examined  Assessment & Plan:   A:  1. Folliculitis  2. Bartholians cyst right labia benign  P:  1. Rx  clindamycin 2. F/u 11/30/17 for Bartholians cyst drainage    By signing my name below, I, Emily [name], attest that this documentation has been prepared under the direction and in the presence of Jonnie Kind, MD. Electronically Signed: [place]. 11/23/17. 3:06 PM.  I personally performed the services described in this documentation, which was SCRIBED in my presence. The  recorded information has been reviewed and considered accurate. It has been edited as necessary during review. Jonnie Kind, MD

## 2017-12-02 ENCOUNTER — Ambulatory Visit: Payer: Medicaid Other | Admitting: Obstetrics and Gynecology

## 2017-12-09 ENCOUNTER — Other Ambulatory Visit: Payer: Self-pay | Admitting: Medical

## 2017-12-09 ENCOUNTER — Ambulatory Visit: Payer: Medicaid Other | Admitting: Obstetrics and Gynecology

## 2017-12-29 ENCOUNTER — Telehealth: Payer: Self-pay | Admitting: Obstetrics & Gynecology

## 2017-12-29 NOTE — Telephone Encounter (Signed)
Spoke with pt. Pt having pressure and pain in both sides and pelvic area. Also having vaginal bleeding. Just had period. Both legs hurt. Advised she needs to schedule an appt. Pt voiced understanding and call was transferred to front desk. JSY

## 2017-12-30 ENCOUNTER — Ambulatory Visit: Payer: Medicaid Other | Admitting: Adult Health

## 2018-01-25 ENCOUNTER — Telehealth: Payer: Self-pay | Admitting: Obstetrics and Gynecology

## 2018-01-25 NOTE — Telephone Encounter (Signed)
Patient states she is having spotting again, just had a period.  Advised patient that she needed to make an appt. Last note on 12/29/17 stated she needed to make an appt.  Verbalized understanding and appt made.

## 2018-01-25 NOTE — Telephone Encounter (Signed)
Patient called stating that she would like a call back from either Dr. Rayna Sexton nurse or Jennifer's nurse. Pt did not state the reason for the call, Please contact pt

## 2018-01-26 ENCOUNTER — Ambulatory Visit (INDEPENDENT_AMBULATORY_CARE_PROVIDER_SITE_OTHER): Payer: Medicaid Other | Admitting: Internal Medicine

## 2018-01-26 ENCOUNTER — Encounter: Payer: Self-pay | Admitting: Internal Medicine

## 2018-01-26 VITALS — BP 140/88 | HR 84 | Ht 63.0 in | Wt 214.2 lb

## 2018-01-26 DIAGNOSIS — R918 Other nonspecific abnormal finding of lung field: Secondary | ICD-10-CM | POA: Diagnosis not present

## 2018-01-26 DIAGNOSIS — Z87891 Personal history of nicotine dependence: Secondary | ICD-10-CM

## 2018-01-26 DIAGNOSIS — R0602 Shortness of breath: Secondary | ICD-10-CM

## 2018-01-26 LAB — NITRIC OXIDE: Nitric Oxide: 5

## 2018-01-26 NOTE — Progress Notes (Signed)
Subjective:    Patient ID: Emily [name], female    DOB: 1980-12-30, 37 y.o.   MRN: [id number] PCP [place], [facility]  HPI   IOV 01/26/2018  Chief Complaint  Patient presents with  . Consult    States she is trying to get re-established with pulmonology. SOB with minimal exertion, chest discomfort, chest pains, cough with clear mucous.    Emily [name] -is a former patient of mine but I have not seen her in close to 10 years.  She is now here with her?  Mom.  She tells me that she is been doing overall stable although in the last few years she has had 2 episodes of pneumonia including one last year but she was admitted to [facility] nearly admitted to the intensive care unit but got better with nebulizers.  She says she nearly got intubated.  She is been following up with Dr. Freda Munro of pulmonary at [facility].  She says last she had a CT scan of the chest with 4 mm lung nodules.  This was the same problem she had 10 years ago for which she was requesting repeated CT scans.  Apparently she has not had a follow-up CT scan and wants 1 done at this point in time.  However she is got new onset symptoms now for the last few months she has had episodic dyspnea that started suddenly.  She is reporting level 1 dyspnea for brushing hair.  Level 3 dyspnea for showering or taking a bath making the bed.  Level for dyspnea for doing the laundry.  Level 5 for walking on a level ground of others with her of age.  And level 5 dyspnea for walking up stairs or walking uphill or sexual activities.  In association she has background chronic cough that is been there for many years.  It is the same with a severe.  She coughs at night.  She does bring phlegm.  It is restricting her social life.  She is never had hemoptysis.  She does not feel a tickle in her throat.  It does affect her voice.  Symptoms are associated with wheezing  Past medical history: Positive for  asthma but negative for COPD rheumatoid arthritis collagen vascular disease sleep apnea.  Positive for diabetes and thyroid disease but negative for stroke or seizures or hepatitis or tuberculosis or kidney disease  Review of systems positive for fatigue arthralgia dysphagia, acid reflux, snoring but otherwise negative  Family history of lung disease positive for asthma and sarcoidosis and mom  Social history: She continues to smoke cigarettes.  Started smoking 20 years ago pack a day.  Does not use any vapor marijuana or cocaine or intravenous drug use.  Home and hobby details.  [place].  She reports using nebulizer but it does not have any mold or missed in it.  The home is not damp.  There is no mildew or mold reported anywhere.  There is no Richardson Dopp there is no sauna or Jacuzzi.  There is no pet birds or parakeets or hamsters or gerbils or rodents.  No use of feather pillows had no mold in the [facility] duct.  Does not play any wind instruments.  Does not do any gardening.  Occupational history 122 point questionnaire is negative for any occupation is associated with interstitial lung disease  Medication history is negative for any medicines associated with interstitial  lung disease   feNO - 01/26/2018 - 5  ppbd and is normal  Simple office walk 185 feet x  3 laps goal with forehead probe 01/26/2018   O2 used Room air  Number laps completed 3  Comments about pace noral  Resting Pulse Ox/HR 98% and 76/min  Final Pulse Ox/HR 95% and 101/min  Desaturated </= 88% yes  Desaturated <= 3% points no  Got Tachycardic >/= 90/min yes  Symptoms at end of test non  Miscellaneous comments x        has a past medical history of Abnormal CT of liver, Anxiety, Asthma, Asthma, Bipolar 1 disorder ([place]), Bipolar disorder ([place]), Chronic pain, Depression, Fatty liver, GERD (gastroesophageal reflux disease), Headache(784.0), Hyperlipidemia, Hypertension,  Pulmonary nodule, Tachycardia, and Thyroid disease.   reports that she has been smoking cigarettes. She has a 19.00 pack-year smoking history. She has never used smokeless tobacco.  Past Surgical History:  Procedure Laterality Date  . DILATION AND CURETTAGE OF UTERUS     1 yr ago    Allergies  Allergen Reactions  . Bactrim [Sulfamethoxazole-Trimethoprim] Anaphylaxis and Other (See Comments)    Chest pain, trouble breathing  . Flagyl [Metronidazole] Shortness Of Breath  . Iodinated Diagnostic Agents Swelling  . Doxycycline Nausea And Vomiting  . Penicillins   . Prednisone Other (See Comments)    Makes her angry and irritable    Immunization History  Administered Date(s) Administered  . Influenza Whole 01/13/2008  . Influenza,inj,Quad PF,6+ Mos 04/18/2015    Family History  Problem Relation Age of Onset  . Asthma Mother   . Diabetes Maternal Grandmother   . Hypertension Maternal Grandmother      Current Outpatient Medications:  .  ACCU-CHEK FASTCLIX LANCETS MISC, USE AS DIRECTED, Disp: 102 each, Rfl: 3 .  albuterol (PROVENTIL HFA;VENTOLIN HFA) 108 (90 Base) MCG/ACT inhaler, Inhale 2 puffs into the lungs every 4 (four) hours as needed., Disp: 18 g, Rfl: 1 .  amLODipine (NORVASC) 10 MG tablet, TAKE 1 TABLET (10 MG TOTAL) BY MOUTH DAILY., Disp: 90 tablet, Rfl: 1 .  Blood Glucose Monitoring Suppl (ACCU-CHEK AVIVA PLUS) w/Device KIT, USE AS DIRECTED, Disp: 1 kit, Rfl: 0 .  lamoTRIgine (LAMICTAL) 150 MG tablet, Take 150 mg by mouth daily.  , Disp: , Rfl:  .  levothyroxine (SYNTHROID, LEVOTHROID) 75 MCG tablet, Take 1 tablet (75 mcg total) by mouth daily before breakfast., Disp: 90 tablet, Rfl: 3 .  montelukast (SINGULAIR) 10 MG tablet, Take 1 tablet (10 mg total) by mouth at bedtime., Disp: 90 tablet, Rfl: 3 .  paroxetine mesylate (PEXEVA) 20 MG tablet, Take 20 mg by mouth daily.  , Disp: , Rfl:  .  propranolol (INDERAL) 20 MG tablet, Take 1 tablet (20 mg total) by mouth 2 (two)  times daily., Disp: 180 tablet, Rfl: 1 .  ziprasidone (GEODON) 40 MG capsule, Take 60 mg by mouth daily. , Disp: , Rfl:     Review of Systems  Constitutional: Negative.  Negative for unexpected weight change.  HENT: Positive for congestion, rhinorrhea and sneezing. Negative for dental problem, ear pain, nosebleeds, postnasal drip, sinus pressure, sore throat and trouble swallowing.   Eyes: Negative for redness and itching.  Respiratory: Positive for cough, chest tightness, shortness of breath and wheezing.   Cardiovascular: Positive for palpitations and leg swelling.  Gastrointestinal: Negative for nausea and vomiting.  Genitourinary: Negative for dysuria.  Musculoskeletal: Negative for joint swelling.  Skin: Negative for rash.  Allergic/Immunologic: Negative.  Negative for  environmental allergies, food allergies and immunocompromised state.  Neurological: Negative for headaches.  Hematological: Does not bruise/bleed easily.  Psychiatric/Behavioral: Negative for dysphoric mood. The patient is not nervous/anxious.        Objective:   Physical Exam  Constitutional: She is oriented to person, place, and time. She appears well-developed and well-nourished. No distress.  HENT:  Head: Normocephalic and atraumatic.  Right Ear: External ear normal.  Left Ear: External ear normal.  Mouth/Throat: Oropharynx is clear and moist. No oropharyngeal exudate.  Eyes: Pupils are equal, round, and reactive to light. Conjunctivae and EOM are normal. Right eye exhibits no discharge. Left eye exhibits no discharge. No scleral icterus.  Neck: Normal range of motion. Neck supple. No JVD present. No tracheal deviation present. No thyromegaly present.  Cardiovascular: Normal rate, regular rhythm, normal heart sounds and intact distal pulses. Exam reveals no gallop and no friction rub.  No murmur heard. Pulmonary/Chest: Effort normal and breath sounds normal. No respiratory distress. She has no wheezes. She  has no rales. She exhibits no tenderness.  Abdominal: Soft. Bowel sounds are normal. She exhibits no distension and no mass. There is no tenderness. There is no rebound and no guarding.  Musculoskeletal: Normal range of motion. She exhibits no edema or tenderness.  Lymphadenopathy:    She has no cervical adenopathy.  Neurological: She is alert and oriented to person, place, and time. She has normal reflexes. No cranial nerve deficit. She exhibits normal muscle tone. Coordination normal.  Skin: Skin is warm and dry. No rash noted. She is not diaphoretic. No erythema. No pallor.  Psychiatric: She has a normal mood and affect. Her behavior is normal. Judgment and thought content normal.  Vitals reviewed.   Vitals:   01/26/18 1413  BP: 140/88  Pulse: 84  SpO2: 96%  Weight: 214 lb 3.2 oz (97.2 kg)  Height: '5\' 3"'$  (1.6 m)    Estimated body mass index is 37.94 kg/m as calculated from the following:   Height as of this encounter: '5\' 3"'$  (1.6 m).   Weight as of this encounter: 214 lb 3.2 oz (97.2 kg).        Assessment & Plan:     ICD-10-CM   1. Shortness of breath R06.02 Nitric oxide    CT Chest High Resolution    Pulmonary function test    CBC w/Diff    IgE    Resp Allergy Profile Regn2DC DE MD [facility]  2. Multiple lung nodules on CT R91.8   3. History of smoking 10-25 pack years Z87.891      Do HRCT  Do full PFT  Do blood cbc with diff, IgE and allergy profile  Followup Next few weeks with app to review all of the above; if negative then need to consider CPST bike test      SIGNATURE    Dr. Brand Males, M.D., F.C.C.P,  Pulmonary and Critical Care Medicine Staff Physician, [place] Director - Interstitial Lung Disease  Program  Pulmonary [place] at [place], [place], [address]  Pager: [phone], If no answer or between  15:00h - 7:00h: call [phone] Telephone: [phone]  3:10  PM 01/26/2018

## 2018-01-26 NOTE — Patient Instructions (Signed)
ICD-10-CM   1. Shortness of breath R06.02 Nitric oxide  2. Multiple lung nodules on CT R91.8   3. History of smoking 10-25 pack years Z87.891     Do HRCT  Do full PFT  Do blood cbc with diff, IgE and allergy profile  Followup Next few weeks with app to review all of the above; if negative then need to consider CPST bike test

## 2018-01-28 ENCOUNTER — Telehealth: Payer: Self-pay | Admitting: Internal Medicine

## 2018-01-28 NOTE — Telephone Encounter (Signed)
Attempted to call pt. I did not receive an answer. I have left a message for pt to return our call.   Pt will need to call central scheduling at [phone] to reschedule.

## 2018-01-28 NOTE — Telephone Encounter (Signed)
Attempted to contact pt. I did not receive an answer. There was no option for me to leave a message. Will try back.  

## 2018-01-28 NOTE — Telephone Encounter (Signed)
Pt is calling back [phone] 

## 2018-01-28 NOTE — Telephone Encounter (Signed)
Pt returned call. Pt given phone number to Central Scheduling to resched CT Scan.

## 2018-01-29 ENCOUNTER — Telehealth: Payer: Self-pay | Admitting: Internal Medicine

## 2018-01-29 MED ORDER — AZITHROMYCIN 250 MG PO TABS: ORAL_TABLET | ORAL | 0 refills | Status: DC

## 2018-01-29 NOTE — Telephone Encounter (Signed)
Called and spoke with pt letting her know we were sending Rx zpak into her pharmacy to help with her symptoms.  Pt expressed understanding. Verified pt's pharmacy and sent Rx in. Nothing further needed.

## 2018-01-29 NOTE — Telephone Encounter (Signed)
Called and spoke with patient.  She is complaining of hoarseness, throat clearing, head congestion, productive cough with green phlegm, and SHOB. She stated that she has been using her nebulizer. She stated that she has a CT chest scheduled for 02/05/18.  She was concerned that she may be getting worse.  Dr. Marchelle Gearing, please advise

## 2018-01-29 NOTE — Telephone Encounter (Signed)
  Z  pak     Allergies  Allergen Reactions  . Bactrim [Sulfamethoxazole-Trimethoprim] Anaphylaxis and Other (See Comments)    Chest pain, trouble breathing  . Flagyl [Metronidazole] Shortness Of Breath  . Iodinated Diagnostic Agents Swelling  . Doxycycline Nausea And Vomiting  . Penicillins   . Prednisone Other (See Comments)    Makes her angry and irritable

## 2018-01-29 NOTE — Telephone Encounter (Signed)
Emily Ellis, MD  Cc: Kayliah Tindol, Farley Ly, CMA        [employer] has denied High Res CT that has been scheduled for 02/05/18. Do you want to do a peer to peer? The phone # is [phone] and use case # [id number]. The info is in your folder.     Sherry    MR, please see the above in regards to pt's HRCT that was ordered which was denied by [employer] to see what you want to do. Thanks!

## 2018-02-01 ENCOUNTER — Ambulatory Visit: Payer: Medicaid Other | Admitting: Obstetrics and Gynecology

## 2018-02-01 ENCOUNTER — Encounter: Payer: Self-pay | Admitting: Obstetrics and Gynecology

## 2018-02-01 DIAGNOSIS — B9689 Other specified bacterial agents as the cause of diseases classified elsewhere: Secondary | ICD-10-CM

## 2018-02-01 DIAGNOSIS — N760 Acute vaginitis: Secondary | ICD-10-CM

## 2018-02-01 DIAGNOSIS — Z113 Encounter for screening for infections with a predominantly sexual mode of transmission: Secondary | ICD-10-CM

## 2018-02-01 MED ORDER — CLINDAMYCIN HCL 300 MG PO CAPS: 300.0000 mg | ORAL_CAPSULE | Freq: Two times a day (BID) | ORAL | 0 refills | Status: DC

## 2018-02-01 NOTE — Progress Notes (Signed)
Patient ID: KHUSHBOO CHUCK, female   DOB: 07-09-1980, 37 y.o.   MRN: [id number]    [place] [facility] Visit  _0 @            Patient name: EURA MCCAUSLIN MRN [id number]  Date of birth: 1980/09/04  CC & HPI:  DAYNE DEKAY is a 37 y.o. female presenting today for pain in vaginal area and spotting. LMP 9/22 lasted 7 days, which was late in the month. Is not sexually active, was last sexually active 3-4 months ago. Pain on her LLQ and in her buttocks, pain radiates down her leg with swelling in her ankle. She says she has normal bowels every day a few times a day. Bartholin cyst on her buttocks does not give her any issues,.  ROS:  ROS +llq tender +slight edema in left ankle +bartholin cyst on left buttocks referred down thelateral and back of left leg -vaginal odor -fever -chills All systems are negative except as noted in the HPI and PMH.  Pertinent History Reviewed:   Reviewed: Medical         Past Medical History:  Diagnosis Date  . Abnormal CT of liver   . Anxiety   . Asthma   . Asthma   . Bipolar 1 disorder ([place])   . Bipolar disorder ([place])   . Chronic pain   . Depression   . Fatty liver   . GERD (gastroesophageal reflux disease)   . Headache(784.0)   . Hyperlipidemia   . Hypertension   . Pulmonary nodule   . Tachycardia   . Thyroid disease                               Surgical Hx:    Past Surgical History:  Procedure Laterality Date  . DILATION AND CURETTAGE OF UTERUS     1 yr ago   Medications: Reviewed & Updated - see associated section                       Current Outpatient Medications:  .  ACCU-CHEK FASTCLIX LANCETS MISC, USE AS DIRECTED, Disp: 102 each, Rfl: 3 .  albuterol (PROVENTIL HFA;VENTOLIN HFA) 108 (90 Base) MCG/ACT inhaler, Inhale 2 puffs into the lungs every 4 (four) hours as needed., Disp: 18 g, Rfl: 1 .  amLODipine (NORVASC) 10 MG tablet, TAKE 1 TABLET (10 MG TOTAL) BY MOUTH DAILY., Disp: 90 tablet, Rfl: 1 .  azithromycin  (ZITHROMAX) 250 MG tablet, Take two today and then one daily until done., Disp: 6 tablet, Rfl: 0 .  Blood Glucose Monitoring Suppl (ACCU-CHEK AVIVA PLUS) w/Device KIT, USE AS DIRECTED, Disp: 1 kit, Rfl: 0 .  lamoTRIgine (LAMICTAL) 150 MG tablet, Take 150 mg by mouth daily.  , Disp: , Rfl:  .  levothyroxine (SYNTHROID, LEVOTHROID) 75 MCG tablet, Take 1 tablet (75 mcg total) by mouth daily before breakfast., Disp: 90 tablet, Rfl: 3 .  montelukast (SINGULAIR) 10 MG tablet, Take 1 tablet (10 mg total) by mouth at bedtime., Disp: 90 tablet, Rfl: 3 .  paroxetine mesylate (PEXEVA) 20 MG tablet, Take 20 mg by mouth daily.  , Disp: , Rfl:  .  propranolol (INDERAL) 20 MG tablet, Take 1 tablet (20 mg total) by mouth 2 (two) times daily., Disp: 180 tablet, Rfl: 1 .  ziprasidone (GEODON) 40 MG capsule, Take 60 mg by mouth daily. , Disp: , Rfl:  Social History: Reviewed -  reports that she has been smoking cigarettes. She has a 19.00 pack-year smoking history. She has never used smokeless tobacco.  Objective Findings:  Vitals: There were no vitals taken for this visit.  PHYSICAL EXAMINATION General appearance - alert, well appearing, and in no distress and oriented to person, place, and time Mental status - alert, oriented to person, place, and time, normal mood, behavior, speech, dress, motor activity, and thought processes, affect appropriate to mood   PELVIC External genitalia -stable, 2 cm bartholin cyst on right, offer to schedule insicion drainage Vagina - normal Cervix - normal in appearance Uterus - non-tender [place]- heavy clue cells GC/CHL collected   Assessment & Plan:   A:  1.  BV 2. Stable right bartholins cyst. 3.   P:  1. Clindamycin 300 bid x 7 d ( metronidazole allergy) 2.  3. F/u gC and chl.    By signing my name below, I, [name], attest that this documentation has been prepared under the direction and in the presence of Jonnie Kind, MD. Electronically  Signed: [place]. 02/01/18. 3:36 PM.  I personally performed the services described in this documentation, which was SCRIBED in my presence. The recorded information has been reviewed and considered accurate. It has been edited as necessary during review. Jonnie Kind, MD

## 2018-02-01 NOTE — Patient Instructions (Signed)
bv Bacterial Vaginosis Bacterial vaginosis is an infection of the vagina. It happens when too many germs (bacteria) grow in the vagina. This infection puts you at risk for infections from sex (STIs). Treating this infection can lower your risk for some STIs. You should also treat this if you are pregnant. It can cause your baby to be born early. Follow these instructions at home: Medicines  Take over-the-counter and prescription medicines only as told by your doctor.  Take or use your antibiotic medicine as told by your doctor. Do not stop taking or using it even if you start to feel better. General instructions  If you your sexual partner is a woman, tell her that you have this infection. She needs to get treatment if she has symptoms. If you have a female partner, he does not need to be treated.  During treatment: ? Avoid sex. ? Do not douche. ? Avoid alcohol as told. ? Avoid breastfeeding as told.  Drink enough fluid to keep your pee (urine) clear or pale yellow.  Keep your vagina and butt (rectum) clean. ? Wash the area with warm water every day. ? Wipe from front to back after you use the toilet.  Keep all follow-up visits as told by your doctor. This is important. Preventing this condition  Do not douche.  Use only warm water to wash around your vagina.  Use protection when you have sex. This includes: ? Latex condoms. ? Dental dams.  Limit how many people you have sex with. It is best to only have sex with the same person (be monogamous).  Get tested for STIs. Have your partner get tested.  Wear underwear that is cotton or lined with cotton.  Avoid tight pants and pantyhose. This is most important in summer.  Do not use any products that have nicotine or tobacco in them. These include cigarettes and e-cigarettes. If you need help quitting, ask your doctor.  Do not use illegal drugs.  Limit how much alcohol you drink. Contact a doctor if:  Your symptoms do not  get better, even after you are treated.  You have more discharge or pain when you pee (urinate).  You have a fever.  You have pain in your belly (abdomen).  You have pain with sex.  Your bleed from your vagina between periods. Summary  This infection happens when too many germs (bacteria) grow in the vagina.  Treating this condition can lower your risk for some infections from sex (STIs).  You should also treat this if you are pregnant. It can cause early (premature) birth.  Do not stop taking or using your antibiotic medicine even if you start to feel better. This information is not intended to replace advice given to you by your health care provider. Make sure you discuss any questions you have with your health care provider. Document Released: 01/15/2008 Document Revised: 12/22/2015 Document Reviewed: 12/22/2015 Elsevier Interactive Patient Education  2017 [employer].

## 2018-02-03 ENCOUNTER — Telehealth: Payer: Self-pay | Admitting: Internal Medicine

## 2018-02-03 LAB — GC/CHLAMYDIA PROBE AMP
Chlamydia trachomatis, NAA: NEGATIVE
Neisseria gonorrhoeae by PCR: NEGATIVE

## 2018-02-03 NOTE — Telephone Encounter (Signed)
Called and spoke with pt who stated she has been coughing and gagging to the point that she vomits. Pt states this has been going on for a while and stated that she has vomited twice today due to the cough.  Pt also states she has had something bulging out of her neck right above her collar bone as well as her belly button. Pt states you can feel it sticking out when she is laying down when she coughs.  Advised pt to go to urgent care or ER if it is severe. Pt expressed understanding. Nothing further needed.

## 2018-02-05 ENCOUNTER — Ambulatory Visit (HOSPITAL_COMMUNITY): Payer: Medicaid Other

## 2018-02-05 NOTE — Telephone Encounter (Signed)
MR, please advise if you were ever able to do the peer-to-peer on pt's CT due to it being denied by [employer].  Thanks!

## 2018-02-08 NOTE — Telephone Encounter (Signed)
MR, please advise if you have been able to get the peer-to-peer review done on pt's CT due to it being denied by [employer] for insurance coverage.  Pt was supposed to have had the CT performed 02/05/18 but it had to be cancelled due to the peer-to-peer not being performed.  Pt then was scheduled to have the scan 10/25 but due to the peer-to-peer not being performed, the scan was again cancelled.  Before it is rescheduled again, pt wants the peer-to-peer to be done so she won't have to keep cancelling and rescheduling the HRCT.

## 2018-02-10 NOTE — Telephone Encounter (Signed)
It will be well into next week before I can call insurance for a peer to peer. Question: did they refuse to approver CT with  [employer] when she was following with hium

## 2018-02-12 ENCOUNTER — Ambulatory Visit (HOSPITAL_COMMUNITY): Payer: Medicaid Other

## 2018-02-16 ENCOUNTER — Ambulatory Visit: Payer: Medicaid Other | Admitting: Primary Care

## 2018-02-16 NOTE — Progress Notes (Deleted)
$'@Patient'd$  ID: Emily [name], female    DOB: September 21, 1980, 37 y.o.   MRN: [id number]  No chief complaint on file.   Referring provider: [facility] *  HPI: 37 year old female, current smoker (19 pack year hx). PMH asthmatic bronchitis, pulmonary nodule, HTN.    Emily [name] -is a former patient of mine but I have not seen her in close to 10 years.  She is now here with her?  Mom.  She tells me that she is been doing overall stable although in the last few years she has had 2 episodes of pneumonia including one last year but she was admitted to [facility] nearly admitted to the intensive care unit but got better with nebulizers.  She says she nearly got intubated.  She is been following up with Dr. Freda Munro of pulmonary at [facility].  She says last she had a CT scan of the chest with 4 mm lung nodules.  This was the same problem she had 10 years ago for which she was requesting repeated CT scans.  Apparently she has not had a follow-up CT scan and wants 1 done at this point in time.  However she is got new onset symptoms now for the last few months she has had episodic dyspnea that started suddenly.  She is reporting level 1 dyspnea for brushing hair.  Level 3 dyspnea for showering or taking a bath making the bed.  Level for dyspnea for doing the laundry.  Level 5 for walking on a level ground of others with her of age.  And level 5 dyspnea for walking up stairs or walking uphill or sexual activities.  In association she has background chronic cough that is been there for many years.  It is the same with a severe.  She coughs at night.  She does bring phlegm.  It is restricting her social life.  She is never had hemoptysis.  She does not feel a tickle in her throat.  It does affect her voice.  Symptoms are associated with wheezing  FENO 5  Plan obtain labs with IgE, HRCT and PFTs. If negative consider cardio/pulmonary stress test (CPST)  02/16/2018 Patient presents  today for 2-3 week follow-up visit. Has not had labs done and HRCT was denied.      Allergies  Allergen Reactions  . Bactrim [Sulfamethoxazole-Trimethoprim] Anaphylaxis and Other (See Comments)    Chest pain, trouble breathing  . Flagyl [Metronidazole] Shortness Of Breath  . Iodinated Diagnostic Agents Swelling  . Doxycycline Nausea And Vomiting  . Penicillins   . Prednisone Other (See Comments)    Makes her angry and irritable    Immunization History  Administered Date(s) Administered  . Influenza Whole 01/13/2008  . Influenza,inj,Quad PF,6+ Mos 04/18/2015    Past Medical History:  Diagnosis Date  . Abnormal CT of liver   . Anxiety   . Asthma   . Asthma   . Bipolar 1 disorder ([place])   . Bipolar disorder ([place])   . Chronic pain   . Depression   . Fatty liver   . GERD (gastroesophageal reflux disease)   . Headache(784.0)   . Hyperlipidemia   . Hypertension   . Pulmonary nodule   . Tachycardia   . Thyroid disease     Tobacco History: Social History   Tobacco Use  Smoking Status Current Every Day Smoker  . Packs/day: 1.00  . Years: 19.00  . Pack years: 19.00  . Types: Cigarettes  Smokeless Tobacco Never Used  Tobacco Comment   ppd 01/26/2018   Ready to quit: Not Answered Counseling given: Not Answered Comment: ppd 01/26/2018   Outpatient Medications Prior to Visit  Medication Sig Dispense Refill  . ACCU-CHEK FASTCLIX LANCETS MISC USE AS DIRECTED 102 each 3  . albuterol (PROVENTIL HFA;VENTOLIN HFA) 108 (90 Base) MCG/ACT inhaler Inhale 2 puffs into the lungs every 4 (four) hours as needed. 18 g 1  . amLODipine (NORVASC) 10 MG tablet TAKE 1 TABLET (10 MG TOTAL) BY MOUTH DAILY. 90 tablet 1  . azithromycin (ZITHROMAX) 250 MG tablet Take two today and then one daily until done. 6 tablet 0  . Blood Glucose Monitoring Suppl (ACCU-CHEK AVIVA PLUS) w/Device KIT USE AS DIRECTED 1 kit 0  . clindamycin (CLEOCIN) 300 MG capsule Take 1 capsule (300 mg total) by mouth 2  (two) times daily. 14 capsule 0  . lamoTRIgine (LAMICTAL) 150 MG tablet Take 150 mg by mouth daily.      Marland Kitchen levothyroxine (SYNTHROID, LEVOTHROID) 75 MCG tablet Take 1 tablet (75 mcg total) by mouth daily before breakfast. 90 tablet 3  . montelukast (SINGULAIR) 10 MG tablet Take 1 tablet (10 mg total) by mouth at bedtime. 90 tablet 3  . paroxetine mesylate (PEXEVA) 20 MG tablet Take 20 mg by mouth daily.      . propranolol (INDERAL) 20 MG tablet Take 1 tablet (20 mg total) by mouth 2 (two) times daily. 180 tablet 1  . ziprasidone (GEODON) 40 MG capsule Take 60 mg by mouth daily.      No facility-administered medications prior to visit.       Review of Systems  Review of Systems   Physical Exam  There were no vitals taken for this visit. Physical Exam   Lab Results:  CBC    Component Value Date/Time   WBC 13.2 (H) 02/20/2015 0001   RBC 5.14 (H) 02/20/2015 0001   HGB 15.6 (H) 02/20/2015 0001   HCT 46.0 02/20/2015 0001   PLT 340 02/20/2015 0001   MCV 89.5 02/20/2015 0001   MCH 30.4 02/20/2015 0001   MCHC 33.9 02/20/2015 0001   RDW 13.4 02/20/2015 0001   LYMPHSABS 3.6 03/30/2013 1500   MONOABS 0.5 03/30/2013 1500   EOSABS 0.3 03/30/2013 1500   BASOSABS 0.1 03/30/2013 1500    BMET    Component Value Date/Time   NA 136 02/20/2015 0001   K 3.9 02/20/2015 0001   CL 100 02/20/2015 0001   CO2 27 02/20/2015 0001   GLUCOSE 113 (H) 02/20/2015 0001   BUN 4 (L) 02/20/2015 0001   CREATININE 0.60 02/20/2015 0001   CALCIUM 9.4 02/20/2015 0001   GFRNONAA >60 09/28/2006 0255   GFRAA  09/28/2006 0255    >60        The eGFR has been calculated using the MDRD equation. This calculation has not been validated in all clinical    BNP No results found for: BNP  ProBNP No results found for: PROBNP  Imaging: No results found.   Assessment & Plan:   No problem-specific Assessment & Plan notes found for this encounter.     Martyn Ehrich, NP 02/16/2018

## 2018-02-24 ENCOUNTER — Encounter (HOSPITAL_COMMUNITY): Payer: Self-pay

## 2018-02-24 ENCOUNTER — Ambulatory Visit (HOSPITAL_COMMUNITY): Payer: Medicaid Other

## 2018-03-02 NOTE — Telephone Encounter (Signed)
Left message for pt to call me to clarify if the pain is near the Bartholins cyst or internal. Previously treated for BV, may need to repeat tx. Will await call by pt.

## 2018-03-03 NOTE — Telephone Encounter (Signed)
MR please advise on patient e-mail below. Thank you.  

## 2018-03-04 NOTE — Telephone Encounter (Signed)
Hi Ms Emily [name]   The insurance will not approve. Sorry. I can try to get an appeal  Which I have not had time to call . I can try calling next week  Thanks    SIGNATURE    Dr. Kalman ShanMurali [name], M.D., F.C.C.P,  Pulmonary and Critical Care Medicine Staff Physician, [facility] Center Director - Interstitial Lung Disease  Program  Pulmonary Fibrosis [facility] - [facility] Network at [facility] [place], [place], [address]  Pager: [phone], If no answer or between  15:00h - 7:00h: call [phone] Telephone: [phone]  8:03 PM 03/04/2018

## 2018-03-08 ENCOUNTER — Telehealth: Payer: Self-pay | Admitting: *Deleted

## 2018-03-08 NOTE — Telephone Encounter (Signed)
Pt called with complaints of black bumps on lips of vagina. Not painful. Noticed it yesterday. Advised needs to be seen. Pt voiced understanding and call was transferred to front desk for appt. JSY

## 2018-03-09 ENCOUNTER — Encounter: Payer: Self-pay | Admitting: Obstetrics and Gynecology

## 2018-03-09 ENCOUNTER — Ambulatory Visit: Payer: Medicaid Other | Admitting: Obstetrics and Gynecology

## 2018-03-09 VITALS — BP 139/99 | HR 69 | Ht 63.0 in | Wt 213.8 lb

## 2018-03-09 DIAGNOSIS — N750 Cyst of Bartholin's gland: Secondary | ICD-10-CM

## 2018-03-09 DIAGNOSIS — I863 Vulval varices: Secondary | ICD-10-CM

## 2018-03-09 NOTE — Progress Notes (Signed)
Patient ID: Emily [name], female   DOB: 05/20/1980, 37 y.o.   MRN: [id number]    [place] [facility] Visit  '@DATE'$ @            Patient name: Emily [name] MRN [id number]  Date of birth: [birth year]  CC & HPI:  Emily [name] is a 37 y.o. female presenting today for black bumps on buttocks and vulva.  ROS:  ROS  +superficial varicose veins, history of cystic acne and face is particularly involved currently is in a quiet phase +bartholin cyst -fever -chills All systems are negative except as noted in the HPI and PMH.   Pertinent History Reviewed:   Reviewed: Medical         Past Medical History:  Diagnosis Date  . Abnormal CT of liver   . Anxiety   . Asthma   . Asthma   . Bipolar 1 disorder ([place])   . Bipolar disorder ([place])   . Chronic pain   . Depression   . Fatty liver   . GERD (gastroesophageal reflux disease)   . Headache(784.0)   . Hyperlipidemia   . Hypertension   . Pulmonary nodule   . Tachycardia   . Thyroid disease                               Surgical Hx:    Past Surgical History:  Procedure Laterality Date  . DILATION AND CURETTAGE OF UTERUS     1 yr ago   Medications: Reviewed & Updated - see associated section                       Current Outpatient Medications:  .  ACCU-CHEK FASTCLIX LANCETS MISC, USE AS DIRECTED, Disp: 102 each, Rfl: 3 .  albuterol (PROVENTIL HFA;VENTOLIN HFA) 108 (90 Base) MCG/ACT inhaler, Inhale 2 puffs into the lungs every 4 (four) hours as needed., Disp: 18 g, Rfl: 1 .  amLODipine (NORVASC) 10 MG tablet, TAKE 1 TABLET (10 MG TOTAL) BY MOUTH DAILY., Disp: 90 tablet, Rfl: 1 .  azithromycin (ZITHROMAX) 250 MG tablet, Take two today and then one daily until done., Disp: 6 tablet, Rfl: 0 .  Blood Glucose Monitoring Suppl (ACCU-CHEK AVIVA PLUS) w/Device KIT, USE AS DIRECTED, Disp: 1 kit, Rfl: 0 .  clindamycin (CLEOCIN) 300 MG capsule, Take 1 capsule (300 mg total) by mouth 2 (two) times daily., Disp: 14 capsule, Rfl: 0 .   lamoTRIgine (LAMICTAL) 150 MG tablet, Take 150 mg by mouth daily.  , Disp: , Rfl:  .  levothyroxine (SYNTHROID, LEVOTHROID) 75 MCG tablet, Take 1 tablet (75 mcg total) by mouth daily before breakfast., Disp: 90 tablet, Rfl: 3 .  montelukast (SINGULAIR) 10 MG tablet, Take 1 tablet (10 mg total) by mouth at bedtime., Disp: 90 tablet, Rfl: 3 .  paroxetine mesylate (PEXEVA) 20 MG tablet, Take 20 mg by mouth daily.  , Disp: , Rfl:  .  propranolol (INDERAL) 20 MG tablet, Take 1 tablet (20 mg total) by mouth 2 (two) times daily., Disp: 180 tablet, Rfl: 1 .  ziprasidone (GEODON) 40 MG capsule, Take 60 mg by mouth daily. , Disp: , Rfl:    Social History: Reviewed -  reports that she has been smoking cigarettes. She has a 19.00 pack-year smoking history. She has never used smokeless tobacco.  Objective Findings:  Vitals: Height '5\' 3"'$  (1.6 m), weight 213 lb 12.8 oz (  97 kg), last menstrual period 02/07/2018.  PHYSICAL EXAMINATION General appearance - alert, well appearing, and in no distress Mental status - alert, oriented to person, place, and time Chest - clear to auscultation, no wheezes, rales or rhonchi, symmetric air entry Heart - normal rate, regular rhythm, normal S1, S2, no murmurs, rubs, clicks or gallops Abdomen - soft, nontender, nondistended, no masses or organomegaly Breasts - breasts appear normal, no suspicious masses, no skin or nipple changes or axillary nodes Skin - normal coloration and turgor, no rashes, no suspicious skin lesions noted  PELVIC External genitalia - superficial varicose veins on right labia minora, deep blue in color. Bartholin cyst Internal examine not done Vagina - not examined Cervix - not examined Uterus - not examined    Assessment & Plan:   A:  1. Superficial varicose vein on right labia minora 2. Bartholin cyst,, right, stable; recommended for drainage at patient discretion.  P:  1. Referral to dermatologist specialized in phototherapy; if patient  wishes 2. GYN: F/U PRN for IND of the Bartholin cyst on the right    By signing my name below, I, [name], attest that this documentation has been prepared under the direction and in the presence of Jonnie Kind, MD. Electronically Signed: [place]. 03/09/18. 3:30 PM.  I personally performed the services described in this documentation, which was SCRIBED in my presence. The recorded information has been reviewed and considered accurate. It has been edited as necessary during review. Jonnie Kind, MD

## 2018-03-09 NOTE — Patient Instructions (Signed)
Bartholin Cyst or Abscess A Bartholin cyst is a fluid-filled sac that forms on a Bartholin gland. Bartholin glands are small glands that are located within the folds of skin (labia) along the sides of the lower opening of the vagina. These glands produce a fluid to moisten the outside of the vagina during sexual intercourse. A Bartholin cyst causes a bulge on the side of the vagina. A cyst that is not large or infected may not cause symptoms or problems. However, if the fluid within the cyst becomes infected, the cyst can turn into an abscess. An abscess may cause discomfort or pain. What are the causes? A Bartholin cyst may develop when the duct of the gland becomes blocked. In many cases, the cause of this is not known. Various kinds of bacteria can cause the cyst to become infected and develop into an abscess. What increases the risk? You may be at an increased risk of developing a Bartholin cyst or abscess if:  You are a woman of reproductive age.  You have a history of previous Bartholin cysts or abscesses.  You have diabetes.  You have a sexually transmitted disease (STD).  What are the signs or symptoms? The severity of symptoms varies depending on the size of the cyst and whether it is infected. Symptoms may include:  A bulge or swelling near the lower opening of your vagina.  Discomfort or pain.  Redness.  Pain during sexual intercourse.  Pain when walking.  Fluid draining from the area.  How is this diagnosed? Your health care provider may make a diagnosis based on your symptoms and a physical exam. He or she will look for swelling in your vaginal area. Blood tests may be done to check for infections. A sample of fluid from the cyst or abscess may also be taken to be tested in a lab. How is this treated? Small cysts that are not infected may not require any treatment. These often go away on their own. Yourhealth care provider will recommend hot baths and the use of warm  compresses. These may also be part of the treatment for an abscess. Treatment options for a large cyst or abscess may include:  Antibiotic medicine.  A surgical procedure to drain the abscess. One of the following procedures may be done: ? Incision and drainage. An incision is made in the cyst or abscess so that the fluid drains out. A catheter may be placed inside the cyst so that it does not close and fill up with fluid again. The catheter will be removed after you have a follow-up visit with a specialist (gynecologist). ? Marsupialization. The cyst or abscess is opened and kept open by stitching the edges of the skin to the walls of the cyst or abscess. This allows it to continue to drain and not fill up with fluid again.  If you have cysts or abscesses that keep returning and have required incision and drainage multiple times, your health care provider may talk to you about surgery to remove the Bartholin gland. Follow these instructions at home:  Take medicines only as directed by your health care provider.  If you were prescribed an antibiotic medicine, finish it all even if you start to feel better.  Apply warm, wet compresses to the area or take warm, shallow baths that cover your pelvic region (sitz baths) several times a day or as directed by your health care provider.  Do not squeeze the cyst or apply heavy pressure to it.    Do not have sexual intercourse until the cyst has gone away.  If your cyst or abscess was opened, a small piece of gauze or a drain may have been placed in the area to allow drainage. Do not remove the gauze or the drain until directed by your health care provider.  Wear feminine pads-not tampons-as needed for any drainage or bleeding.  Keep all follow-up visits as directed by your health care provider. This is important. How is this prevented? Take these steps to help prevent a Bartholin cyst from returning:  Practice good hygiene.  Clean your vaginal  area with mild soap and a soft cloth when you bathe.  Practice safe sex to prevent STDs.  Contact a health care provider if:  You have increased pain, swelling, or redness in the area of the cyst.  Puslike drainage is coming from the cyst.  You have a fever. This information is not intended to replace advice given to you by your health care provider. Make sure you discuss any questions you have with your health care provider. Document Released: 04/07/2005 Document Revised: 09/13/2015 Document Reviewed: 11/21/2013 Elsevier Interactive Patient Education  2018 [employer] Inc[employer] Bartholin Cyst or Abscess A Bartholin cyst is a fluid-filled sac that forms on a Bartholin gland. Bartholin glands are small glands that are found in the folds of skin (labia) on the sides of the lower opening of the vagina. This type of cyst causes a bulge on the side of the vagina. A cyst that is not large or infected may not cause problems. However, if the fluid in the cyst becomes infected, the cyst can turn into an abscess. An abscess may cause discomfort or pain. Follow these instructions at home:  Take medicines only as told by your doctor.  If you were prescribed an antibiotic medicine, finish all of it even if you start to feel better.  Apply warm, wet compresses to the area or take warm, shallow baths that cover your pelvic area (sitz baths). Do this many times each day or as told by your doctor.  Do not squeeze the cyst. Do not apply heavy pressure to it.  Do not have sex until the cyst has gone away.  If your cyst or abscess was opened by your doctor, a small piece of gauze or a drain may have been placed in the area. That lets the cyst drain. Do not remove the gauze or the drain until your doctor tells you it is okay to do that.  Do not wear tampons. Wear feminine pads as needed for any fluid or blood.  Keep all follow-up visits as told by your doctor. This is important. Contact a doctor if:  Your  pain, puffiness (swelling), or redness in the area of the cyst gets worse.  You have fluid or pus pus coming from the cyst.  You have a fever. This information is not intended to replace advice given to you by your health care provider. Make sure you discuss any questions you have with your health care provider. Document Released: 07/04/2008 Document Revised: 09/13/2015 Document Reviewed: 11/21/2013 Elsevier Interactive Patient Education  2018 [employer]

## 2018-03-22 ENCOUNTER — Telehealth: Payer: Self-pay | Admitting: Adult Health

## 2018-03-22 NOTE — Telephone Encounter (Signed)
[facility]

## 2018-03-22 NOTE — Telephone Encounter (Signed)
Emily [name]  Thanksa lot. Really appreciate it    SIGNATURE    Dr. Kalman ShanMurali Jacelyn Cuen, M.D., F.C.C.P,  Pulmonary and Critical Care Medicine Staff Physician, [facility] Center Director - Interstitial Lung Disease  Program  Pulmonary Fibrosis [facility] Care Center Network at [facility] [place], [place], [id number]  Pager: [phone], If no answer or between  15:00h - 7:00h: call [phone] Telephone: [phone]  5:46 PM 03/22/2018

## 2018-03-22 NOTE — Telephone Encounter (Signed)
[name]  Thanks for doing the peer to peer. Please reply to [name] via this message when done  MR

## 2018-03-22 NOTE — Telephone Encounter (Signed)
Pt complains pain in stomach, missed period, had negative HPT, appt 12/4 at 2 pm with Dr Emelda FearFerguson

## 2018-03-24 ENCOUNTER — Ambulatory Visit: Payer: Medicaid Other | Admitting: Obstetrics and Gynecology

## 2018-03-24 VITALS — BP 127/91 | HR 73 | Ht 63.0 in | Wt 214.0 lb

## 2018-03-24 DIAGNOSIS — Z794 Long term (current) use of insulin: Secondary | ICD-10-CM | POA: Diagnosis not present

## 2018-03-24 DIAGNOSIS — E0800 Diabetes mellitus due to underlying condition with hyperosmolarity without nonketotic hyperglycemic-hyperosmolar coma (NKHHC): Secondary | ICD-10-CM | POA: Diagnosis not present

## 2018-03-24 DIAGNOSIS — Z3202 Encounter for pregnancy test, result negative: Secondary | ICD-10-CM | POA: Diagnosis not present

## 2018-03-24 DIAGNOSIS — N898 Other specified noninflammatory disorders of vagina: Secondary | ICD-10-CM | POA: Diagnosis not present

## 2018-03-24 LAB — POCT WET PREP (WET MOUNT): Trichomonas Wet Prep HPF POC: ABSENT

## 2018-03-24 LAB — POCT URINE PREGNANCY: Preg Test, Ur: NEGATIVE

## 2018-03-24 MED ORDER — MEDROXYPROGESTERONE ACETATE 10 MG PO TABS: 10.0000 mg | ORAL_TABLET | Freq: Every day | ORAL | 1 refills | Status: DC

## 2018-03-24 MED ORDER — CLINDAMYCIN PHOSPHATE 2 % VA CREA: 1.0000 | TOPICAL_CREAM | Freq: Every day | VAGINAL | Status: DC

## 2018-03-24 NOTE — Progress Notes (Signed)
Patient ID: Emily [name], female   DOB: 01/15/1981, 37 y.o.   MRN: [id number]    [facility] Visit  '@DATE'$ @            Patient name: Emily [name] MRN [id number]  Date of birth: June 30, 1980  CC & HPI:  SATSUKI [name] is a 37 y.o. female presenting today for stomach and back pain with missed period in November. She has total abdominal pain i.e. on both sides and in the middle of abdomen. Has not resumed sexual activity. Heat alleviates the pain. Pain is worse at night, she notes she gets full fast and feels bloated. When wiping after using restroom in office she noticed vaginal mucous.  Pregnancy test done in office: Negative  ROS:  ROS  +abdominal pain +back pain -fever -chills All systems are negative except as noted in the HPI and PMH.   Pertinent History Reviewed:   Reviewed:  Medical         Past Medical History:  Diagnosis Date  . Abnormal CT of liver   . Anxiety   . Asthma   . Asthma   . Bipolar 1 disorder ([place])   . Bipolar disorder ([place])   . Chronic pain   . Depression   . Fatty liver   . GERD (gastroesophageal reflux disease)   . Headache(784.0)   . Hyperlipidemia   . Hypertension   . Pulmonary nodule   . Tachycardia   . Thyroid disease                               Surgical Hx:    Past Surgical History:  Procedure Laterality Date  . DILATION AND CURETTAGE OF UTERUS     1 yr ago   Medications: Reviewed & Updated - see associated section                       Current Outpatient Medications:  .  ACCU-CHEK FASTCLIX LANCETS MISC, USE AS DIRECTED, Disp: 102 each, Rfl: 3 .  amLODipine (NORVASC) 10 MG tablet, TAKE 1 TABLET (10 MG TOTAL) BY MOUTH DAILY., Disp: 90 tablet, Rfl: 1 .  Blood Glucose Monitoring Suppl (ACCU-CHEK AVIVA PLUS) w/Device KIT, USE AS DIRECTED, Disp: 1 kit, Rfl: 0 .  lamoTRIgine (LAMICTAL) 150 MG tablet, Take 150 mg by mouth daily.  , Disp: , Rfl:  .  levothyroxine (SYNTHROID, LEVOTHROID) 75 MCG tablet, Take 1 tablet (75 mcg  total) by mouth daily before breakfast., Disp: 90 tablet, Rfl: 3 .  montelukast (SINGULAIR) 10 MG tablet, Take 1 tablet (10 mg total) by mouth at bedtime., Disp: 90 tablet, Rfl: 3 .  paroxetine mesylate (PEXEVA) 20 MG tablet, Take 20 mg by mouth daily.  , Disp: , Rfl:  .  propranolol (INDERAL) 20 MG tablet, Take 1 tablet (20 mg total) by mouth 2 (two) times daily., Disp: 180 tablet, Rfl: 1 .  ziprasidone (GEODON) 40 MG capsule, Take 60 mg by mouth daily. , Disp: , Rfl:  .  albuterol (PROVENTIL HFA;VENTOLIN HFA) 108 (90 Base) MCG/ACT inhaler, Inhale 2 puffs into the lungs every 4 (four) hours as needed. (Patient not taking: Reported on 03/09/2018), Disp: 18 g, Rfl: 1 .  azithromycin (ZITHROMAX) 250 MG tablet, Take two today and then one daily until done. (Patient not taking: Reported on 03/09/2018), Disp: 6 tablet, Rfl: 0 .  clindamycin (CLEOCIN) 300 MG capsule, Take 1 capsule (  300 mg total) by mouth 2 (two) times daily. (Patient not taking: Reported on 03/09/2018), Disp: 14 capsule, Rfl: 0   Social History: Reviewed -  reports that she has been smoking cigarettes. She has a 19.00 pack-year smoking history. She has never used smokeless tobacco.  Objective Findings:  Vitals: Blood pressure (!) 127/91, pulse 73, height '5\' 3"'$  (1.6 m), weight 214 lb (97.1 kg), last menstrual period 02/07/2018.  PHYSICAL EXAMINATION General appearance - alert, well appearing, and in no distress Mental status - alert, oriented to person, place, and time, normal mood, behavior, speech, dress, motor activity, and thought processes, affect appropriate to mood Abdomen - soft, nontender, nondistended, no masses or organomegaly  PELVIC External genitalia - bartholin cyst, stable Vagina - mucous excretion   Wet [place] -  Positive Clue cells and white cell  Assessment & Plan:   A: amenorrhea probable anovulation 1.  Bartholin Cyst, stable 2. Bacterial vaginosis  P:  1.  Rx Provera 10 x 10 d 2. Rx  Metronidazole. 3. F/u PRN     By signing my name below, I, [name], attest that this documentation has been prepared under the direction and in the presence of Jonnie Kind, MD. Electronically Signed: [place]. 03/24/18. 2:07 PM.  I personally performed the services described in this documentation, which was SCRIBED in my presence. The recorded information has been reviewed and considered accurate. It has been edited as necessary during review. Jonnie Kind, MD

## 2018-03-24 NOTE — Patient Instructions (Signed)
Cervical Conization °Cervical conization (cone biopsy) is a procedure in which a cone-shaped portion of the cervix is cut out so that it can be examined under a microscope. The procedure is done to check for cancer cells or cells that might turn into cancer (precancerous cells). You may have this procedure if: °· You have abnormal bleeding from your cervix. °· You had an abnormal Pap test. °· Something abnormal was seen on your cervix during an exam. ° °This procedure is performed in either a health care provider’s office or in an operating room. °Tell a health care provider about: °· Any allergies you have. °· All medicines you are taking, including vitamins, herbs, eye drops, creams, and over-the-counter medicines. °· Any problems you or family members have had with the use of anesthetic medicines. °· Any blood disorders you have. °· Any surgeries you have had. °· Any medical conditions you have. °· Your smoking habits. °· When you normally have your period. °· Whether you are pregnant or may be pregnant. °What are the risks? °Generally, this is a safe procedure. However, problems may occur, including: °· Heavy bleeding for several days or weeks after the procedure. °· Allergic reactions to medicines or dyes. °· Increased risk of preterm labor in future pregnancies. °· Infection (rare). °· Damage to the cervix or other structures or organs (rare). ° °What happens before the procedure? °Staying hydrated °Follow instructions from your health care provider about hydration, which may include: °· Up to 2 hours before the procedure - you may continue to drink clear liquids, such as water, clear fruit juice, black coffee, and plain tea. ° °Eating and drinking restrictions °Follow instructions from your health care provider about eating and drinking, which may include: °· 8 hours before the procedure - stop eating heavy meals or foods such as meat, fried foods, or fatty foods. °· 6 hours before the procedure - stop eating  light meals or foods, such as toast or cereal. °· 6 hours before the procedure - stop drinking milk or drinks that contain milk. °· 2 hours before the procedure - stop drinking clear liquids. ° °General instructions °· Do not douche, have sex, use tampons, or use any vaginal medicines before the procedure as told by your health care provider. °· You may be asked to empty your bladder and bowel right before the procedure. °· Ask your health care provider about: °? Changing or stopping your normal medicines. This is important if you take diabetes medicines or blood thinners. °? Taking medicines such as aspirin and ibuprofen. These medicines can thin your blood. Do not take these medicines before your procedure if your doctor tells you not to. °· Plan to have someone take you home from the hospital or clinic. °What happens during the procedure? °· To reduce your risk of infection: °? Your health care team will wash or sanitize their hands. °? Your skin will be washed with soap. °? Hair may be removed from the surgical area. °· You will undress from the waist down and be given a gown to wear. °· You will lie on an examining table and put your feet in stirrups. °· An IV tube will be inserted into one of your veins. °· You will be given one or more of the following: °? A medicine to help you relax (sedative). °? A medicine to numb the area (local anesthetic). °? A medicine to make you fall asleep (general anesthetic). °? A medicine that numbs the cervix (cervical block). °·   A lubricated device called a speculum will be inserted into your vagina. It will be used to spread open the walls of the vagina so your health care provider can see the inside of the vagina and cervix better. °· An instrument that has a magnifying lens and a light (colposcope) will let your health care provider examine the cervix more closely. °· Your health care provider will apply a solution to your cervix. This turns abnormal areas a pale  color. °· A tissue sample will be removed from the cervix using one of the following methods: °? The cold knife method. In this method, the tissue is cut out with a knife (scalpel). °? The loop electrosurgical excision procedure (LEEP) method. In this method, the tissue is cut out with a thin wire that can burn (cauterize) the tissue with an electrical current. °? Laser treatment method. In this method, the tissue is cut out and then cauterized with a laser beam to prevent bleeding. °· Your health care provider will apply a paste over the biopsy areas to help control bleeding. °· The tissue sample will be examined under a microscope. °The procedure may vary among health care providers and hospitals. °What happens after the procedure? °· Your blood pressure, heart rate, breathing rate, and blood oxygen level will be monitored often until the medicines you were given have worn off. °· If you were given a local anesthetic, you will rest at the clinic or hospital until you are stable and feel ready to go home. °· If you were given a general anesthetic, you may be monitored for a longer period of time. °· You may have some cramping. °· You may have bloody discharge or light to moderate bleeding. °· You may have dark discharge coming from your vagina. This is from the paste used on the cervix to prevent bleeding. °Summary °· Cervical conization is a procedure in which a cone-shaped portion of the cervix is cut out so that it can be examined under a microscope. °· The procedure is done to check for cancer cells or cells that might turn into cancer (precancerous cells). °This information is not intended to replace advice given to you by your health care provider. Make sure you discuss any questions you have with your health care provider. °Document Released: 01/15/2005 Document Revised: 04/09/2016 Document Reviewed: 04/09/2016 °Elsevier Interactive Patient Education © 2017 [employer] ° °

## 2018-03-29 ENCOUNTER — Ambulatory Visit: Payer: Medicaid Other | Admitting: Obstetrics and Gynecology

## 2018-03-30 NOTE — Telephone Encounter (Signed)
I think tammy parrett addressed this

## 2018-03-31 ENCOUNTER — Ambulatory Visit (HOSPITAL_COMMUNITY)
Admission: RE | Admit: 2018-03-31 | Discharge: 2018-03-31 | Disposition: A | Discharge status: Home / Self Care | Payer: Medicaid Other | Source: Ambulatory Visit | Attending: Internal Medicine | Admitting: Internal Medicine

## 2018-03-31 DIAGNOSIS — R0602 Shortness of breath: Secondary | ICD-10-CM

## 2018-04-01 ENCOUNTER — Telehealth: Payer: Self-pay | Admitting: Internal Medicine

## 2018-04-01 DIAGNOSIS — R0602 Shortness of breath: Secondary | ICD-10-CM

## 2018-04-01 DIAGNOSIS — R9389 Abnormal findings on diagnostic imaging of other specified body structures: Secondary | ICD-10-CM

## 2018-04-01 NOTE — Telephone Encounter (Signed)
Called patient, unable to reach left message to give us a call back. 

## 2018-04-02 NOTE — Telephone Encounter (Signed)
Pt said that she never received the phone call. Pt is calling back [phone]

## 2018-04-02 NOTE — Telephone Encounter (Signed)
Pt returned call. I gave her the results of the HRCT. Stated to her that I was going to place referral to cardiology. Pt expressed understanding. I also scheduled pt's follow up appt with PFT prior for 1/15 with PFT at 10am followed by OV with MR at 11:15. Nothing further needed.

## 2018-04-02 NOTE — Telephone Encounter (Signed)
Nodule stabe  There is calcium deposit in heart blood vessesl - usually aging but she isonly 37. So refer to cardiology  There might be some lung inflammation - PFT and return to see Dr Mannam or myIsaiah Sergeself next few weeks to review   Ct Chest High Resolution  Result Date: 04/01/2018 CLINICAL DATA:  37 year old female with history of right middle lobe pulmonary nodules. Shortness of breath. Cough. EXAM: CT CHEST WITHOUT CONTRAST TECHNIQUE: Multidetector CT imaging of the chest was performed following the standard protocol without intravenous contrast. High resolution imaging of the lungs, as well as inspiratory and expiratory imaging, was performed. COMPARISON:  Chest CT 10/26/2007. FINDINGS: Cardiovascular: Heart size is normal. There is no significant pericardial fluid, thickening or pericardial calcification. There is aortic atherosclerosis, as well as atherosclerosis of the great vessels of the mediastinum and the coronary arteries, including calcified atherosclerotic plaque in the left anterior descending coronary artery. Mediastinum/Nodes: No pathologically enlarged mediastinal or hilar lymph nodes. Please note that accurate exclusion of hilar adenopathy is limited on noncontrast CT scans. Esophagus is unremarkable in appearance. No axillary lymphadenopathy. Lungs/Pleura: 5 mm right middle lobe nodule (axial image 66 of series 7) and 4 mm right middle lobe nodule (axial image 74 of series 7), both stable compared to prior study from 2009, considered definitively benign. No other larger more suspicious appearing pulmonary nodules or masses are noted. A few patchy areas of mild ground-glass attenuation are noted, most evident in the lateral segment of the right middle lobe and in the anterior aspect of the right upper lobe medially. High-resolution images demonstrate no significant regions of septal thickening, subpleural reticulation, parenchymal banding, traction bronchiectasis or frank honeycombing.  Inspiratory and expiratory imaging is unremarkable. No acute consolidative airspace disease. No pleural effusions. Upper Abdomen: Mild diffuse low attenuation throughout the visualized hepatic parenchyma, indicative of hepatic steatosis. Musculoskeletal: There are no aggressive appearing lytic or blastic lesions noted in the visualized portions of the skeleton. IMPRESSION: 1. There are few patchy areas of mild ground-glass attenuation in the lungs bilaterally, which are nonspecific. The possibility of interstitial lung disease is not entirely excluded, but is not favored. At this time, findings would be classified as alternative diagnosis per current ATS guidelines. If there is persistent clinical concern for interstitial lung disease, repeat high-resolution chest CT could be obtained in 12 months to assess for temporal changes in the appearance of the lung parenchyma. 2. Aortic atherosclerosis, in addition to left anterior descending coronary artery disease. Please note that although the presence of coronary artery calcium documents the presence of coronary artery disease, the severity of this disease and any potential stenosis cannot be assessed on this non-gated CT examination. Assessment for potential risk factor modification, dietary therapy or pharmacologic therapy may be warranted, if clinically indicated. 3. Stable small pulmonary nodules in the right middle lobe dating back to 2009, considered definitively benign. 4. Mild hepatic steatosis. Aortic Atherosclerosis (ICD10-I70.0). Electronically Signed   By: Trudie Reedaniel  Entrikin M.D.   On: 04/01/2018 09:30

## 2018-04-02 NOTE — Telephone Encounter (Signed)
Attempted to call pt but unable to reach her. Left message for pt to return call. 

## 2018-04-02 NOTE — Telephone Encounter (Signed)
Called and spoke with patient, she is requesting results from CT scan. MR please advise on this, thank you.

## 2018-04-05 ENCOUNTER — Telehealth: Payer: Self-pay | Admitting: Internal Medicine

## 2018-04-05 NOTE — Telephone Encounter (Signed)
Called and spoke with the patient she wanted to go back over the results of her ct scan.  Nothing further needed at this time.  Kalman Shanamaswamy, Murali, MD       Nodule stabe  There is calcium deposit in heart blood vessesl - usually aging but she isonly 37. So refer to cardiology  There might be some lung inflammation - PFT and return to see Dr Isaiah SergeMannam or myself next few weeks to review   Ct Chest High Resolution  Result Date: 04/01/2018 CLINICAL DATA:  37 year old female with history of right middle lobe pulmonary nodules. Shortness of breath. Cough. EXAM: CT CHEST WITHOUT CONTRAST TECHNIQUE: Multidetector CT imaging of the chest was performed following the standard protocol without intravenous contrast. High resolution imaging of the lungs, as well as inspiratory and expiratory imaging, was performed. COMPARISON:  Chest CT 10/26/2007. FINDINGS: Cardiovascular: Heart size is normal. There is no significant pericardial fluid, thickening or pericardial calcification. There is aortic atherosclerosis, as well as atherosclerosis of the great vessels of the mediastinum and the coronary arteries, including calcified atherosclerotic plaque in the left anterior descending coronary artery. Mediastinum/Nodes: No pathologically enlarged mediastinal or hilar lymph nodes. Please note that accurate exclusion of hilar adenopathy is limited on noncontrast CT scans. Esophagus is unremarkable in appearance. No axillary lymphadenopathy. Lungs/Pleura: 5 mm right middle lobe nodule (axial image 66 of series 7) and 4 mm right middle lobe nodule (axial image 74 of series 7), both stable compared to prior study from 2009, considered definitively benign. No other larger more suspicious appearing pulmonary nodules or masses are noted. A few patchy areas of mild ground-glass attenuation are noted, most evident in the lateral segment of the right middle lobe and in the anterior aspect of the right upper lobe medially.  High-resolution images demonstrate no significant regions of septal thickening, subpleural reticulation, parenchymal banding, traction bronchiectasis or frank honeycombing. Inspiratory and expiratory imaging is unremarkable. No acute consolidative airspace disease. No pleural effusions. Upper Abdomen: Mild diffuse low attenuation throughout the visualized hepatic parenchyma, indicative of hepatic steatosis. Musculoskeletal: There are no aggressive appearing lytic or blastic lesions noted in the visualized portions of the skeleton. IMPRESSION: 1. There are few patchy areas of mild ground-glass attenuation in the lungs bilaterally, which are nonspecific. The possibility of interstitial lung disease is not entirely excluded, but is not favored. At this time, findings would be classified as alternative diagnosis per current ATS guidelines. If there is persistent clinical concern for interstitial lung disease, repeat high-resolution chest CT could be obtained in 12 months to assess for temporal changes in the appearance of the lung parenchyma. 2. Aortic atherosclerosis, in addition to left anterior descending coronary artery disease. Please note that although the presence of coronary artery calcium documents the presence of coronary artery disease, the severity of this disease and any potential stenosis cannot be assessed on this non-gated CT examination. Assessment for potential risk factor modification, dietary therapy or pharmacologic therapy may be warranted, if clinically indicated. 3. Stable small pulmonary nodules in the right middle lobe dating back to 2009, considered definitively benign. 4. Mild hepatic steatosis. Aortic Atherosclerosis (ICD10-I70.0). Electronically Signed   By: Trudie Reedaniel  Entrikin M.D.   On: 04/01/2018 09:30

## 2018-04-05 NOTE — Telephone Encounter (Signed)
Called and spoke with patient, she wanted someone to go over CT results with her. Results have been given to patient per MR response. Nothing further needed.

## 2018-04-16 ENCOUNTER — Telehealth: Payer: Self-pay | Admitting: Internal Medicine

## 2018-04-16 NOTE — Telephone Encounter (Signed)
Attempted to call pt but unable to reach her. Left message for pt to return call. 

## 2018-04-20 NOTE — Telephone Encounter (Signed)
Attempted to call pt but unable to reach her. Left message for pt to return call. 

## 2018-04-22 NOTE — Telephone Encounter (Signed)
Attempted to contact pt. I did not receive an answer. I have left a message for pt to return our call.  

## 2018-04-22 NOTE — Telephone Encounter (Signed)
Patient returned call, CB is [phone]

## 2018-04-22 NOTE — Telephone Encounter (Signed)
Spoke with pt. She is requesting an interpretation of her results of her CT from 04/01/2018.  MR - please advise. Thanks.

## 2018-04-26 NOTE — Telephone Encounter (Signed)
Attempted to call pt but unable to reach her. Left a detailed message for pt stating to her that MR will go over results in full detail at upcoming OV. Nothing further needed.

## 2018-04-26 NOTE — Telephone Encounter (Signed)
These results were given to her on 04/05/18. Please see note. I will discuss more face to face later this month . Meanwhile, you can share the discussion from [facility] note 04/05/18

## 2018-05-05 ENCOUNTER — Ambulatory Visit (INDEPENDENT_AMBULATORY_CARE_PROVIDER_SITE_OTHER): Payer: Medicaid Other | Admitting: Internal Medicine

## 2018-05-05 ENCOUNTER — Encounter: Payer: Self-pay | Admitting: Internal Medicine

## 2018-05-05 VITALS — BP 132/78 | HR 97 | Ht 63.0 in | Wt 216.2 lb

## 2018-05-05 DIAGNOSIS — Z87891 Personal history of nicotine dependence: Secondary | ICD-10-CM

## 2018-05-05 DIAGNOSIS — I2510 Atherosclerotic heart disease of native coronary artery without angina pectoris: Secondary | ICD-10-CM

## 2018-05-05 DIAGNOSIS — R0602 Shortness of breath: Secondary | ICD-10-CM | POA: Diagnosis not present

## 2018-05-05 DIAGNOSIS — R918 Other nonspecific abnormal finding of lung field: Secondary | ICD-10-CM

## 2018-05-05 LAB — SEDIMENTATION RATE: Sed Rate: 30 mm/hr — ABNORMAL HIGH (ref 0–20)

## 2018-05-05 NOTE — Progress Notes (Signed)
PI   IOV 01/26/2018  Chief Complaint  Patient presents with  . Consult    States she is trying to get re-established with pulmonology. SOB with minimal exertion, chest discomfort, chest pains, cough with clear mucous.    Emily [name] -is a former patient of mine but I have not seen her in close to 10 years.  She is now here with her?  Mom.  She tells me that she is been doing overall stable although in the last few years she has had 2 episodes of pneumonia including one last year but she was admitted to [facility] nearly admitted to the intensive care unit but got better with nebulizers.  She says she nearly got intubated.  She is been following up with Dr. Freda Munro of pulmonary at [facility].  She says last she had a CT scan of the chest with 4 mm lung nodules.  This was the same problem she had 10 years ago for which she was requesting repeated CT scans.  Apparently she has not had a follow-up CT scan and wants 1 done at this point in time.  However she is got new onset symptoms now for the last few months she has had episodic dyspnea that started suddenly.  She is reporting level 1 dyspnea for brushing hair.  Level 3 dyspnea for showering or taking a bath making the bed.  Level for dyspnea for doing the laundry.  Level 5 for walking on a level ground of others with her of age.  And level 5 dyspnea for walking up stairs or walking uphill or sexual activities.  In association she has background chronic cough that is been there for many years.  It is the same with a severe.  She coughs at night.  She does bring phlegm.  It is restricting her social life.  She is never had hemoptysis.  She does not feel a tickle in her throat.  It does affect her voice.  Symptoms are associated with wheezing  Past medical history: Positive for asthma but negative for COPD rheumatoid arthritis collagen vascular disease sleep apnea.  Positive for diabetes and thyroid disease but negative for stroke  or seizures or hepatitis or tuberculosis or kidney disease  Review of systems positive for fatigue arthralgia dysphagia, acid reflux, snoring but otherwise negative  Family history of lung disease positive for asthma and sarcoidosis and mom  Social history: She continues to smoke cigarettes.  Started smoking 20 years ago pack a day.  Does not use any vapor marijuana or cocaine or intravenous drug use.  Home and hobby details.  [place].  She reports using nebulizer but it does not have any mold or missed in it.  The home is not damp.  There is no mildew or mold reported anywhere.  There is no Richardson Dopp there is no sauna or Jacuzzi.  There is no pet birds or parakeets or hamsters or gerbils or rodents.  No use of feather pillows had no mold in the [facility] duct.  Does not play any wind instruments.  Does not do any gardening.  Occupational history 122 point questionnaire is negative for any occupation is associated with interstitial lung disease  Medication history is negative for any medicines associated with interstitial lung disease   feNO - 01/26/2018 - 5  ppbd and is normal  Simple office walk 185 feet x  3 laps goal with forehead probe  01/26/2018   O2 used Room air  Number laps completed 3  Comments about pace noral  Resting Pulse Ox/HR 98% and 76/min  Final Pulse Ox/HR 95% and 101/min  Desaturated </= 88% yes  Desaturated <= 3% points no  Got Tachycardic >/= 90/min yes  Symptoms at end of test non  Miscellaneous comments x     OV 05/05/2018  Subjective:  Patient ID: Emily [name], female , DOB: May 01, 1980 , age 38 y.o. , MRN: [id number] , ADDRESS: [place] [address]   05/05/2018 -   Chief Complaint  Patient presents with  . Follow-up    Pt states she has been well since last visit and denies any complaints but she is currently on zpak which she began yesterday, 05/04/2018.     HPI Emily [name] 38  y.o. -returns for follow-up for her ongoing shortness of breath after work-up.  She continues to have exertional shortness of breath.  She says she is also gaining weight with increased hunger pains at night.  She had high-resolution CT scan of the chest that is significant for coronary artery calcification in the left anterior descending artery.  In addition there are groundglass opacities on high-resolution CT chest.  The old nodules are still stable.  She is here to discuss all the results.  She is expressing interest in quitting smoking but finding it difficult.   IMPRESSION: 1. There are few patchy areas of mild ground-glass attenuation in the lungs bilaterally, which are nonspecific. The possibility of interstitial lung disease is not entirely excluded, but is not favored. At this time, findings would be classified as alternative diagnosis per current ATS guidelines. If there is persistent clinical concern for interstitial lung disease, repeat high-resolution chest CT could be obtained in 12 months to assess for temporal changes in the appearance of the lung parenchyma. 2. Aortic atherosclerosis, in addition to left anterior descending coronary artery disease. Please note that although the presence of coronary artery calcium documents the presence of coronary artery disease, the severity of this disease and any potential stenosis cannot be assessed on this non-gated CT examination. Assessment for potential risk factor modification, dietary therapy or pharmacologic therapy may be warranted, if clinically indicated. 3. Stable small pulmonary nodules in the right middle lobe dating back to 2009, considered definitively benign. 4. Mild hepatic steatosis.  Aortic Atherosclerosis (ICD10-I70.0).   Electronically Signed   By: Vinnie Langton M.D.   On: 04/01/2018 09:30  ROS - per HPI     has a past medical history of Abnormal CT of liver, Anxiety, Asthma, Asthma, Bipolar 1  disorder ([place]), Bipolar disorder ([place]), Chronic pain, Depression, Fatty liver, GERD (gastroesophageal reflux disease), Headache(784.0), Hyperlipidemia, Hypertension, Pulmonary nodule, Tachycardia, and Thyroid disease.   reports that she has been smoking cigarettes. She has a 19.00 pack-year smoking history. She has never used smokeless tobacco.  Past Surgical History:  Procedure Laterality Date  . DILATION AND CURETTAGE OF UTERUS     1 yr ago    Allergies  Allergen Reactions  . Bactrim [Sulfamethoxazole-Trimethoprim] Anaphylaxis and Other (See Comments)    Chest pain, trouble breathing  . Flagyl [Metronidazole] Shortness Of Breath  . Iodinated Diagnostic Agents Swelling  . Doxycycline Nausea And Vomiting  . Penicillins   . Prednisone Other (See Comments)    Makes her angry and irritable    Immunization History  Administered Date(s) Administered  . Influenza Whole 01/13/2008  . Influenza,inj,Quad PF,6+ Mos 04/18/2015  Family History  Problem Relation Age of Onset  . Asthma Mother   . Diabetes Maternal Grandmother   . Hypertension Maternal Grandmother      Current Outpatient Medications:  .  ACCU-CHEK FASTCLIX LANCETS MISC, USE AS DIRECTED, Disp: 102 each, Rfl: 3 .  albuterol (PROVENTIL HFA;VENTOLIN HFA) 108 (90 Base) MCG/ACT inhaler, Inhale 2 puffs into the lungs every 4 (four) hours as needed., Disp: 18 g, Rfl: 1 .  amLODipine (NORVASC) 10 MG tablet, TAKE 1 TABLET (10 MG TOTAL) BY MOUTH DAILY., Disp: 90 tablet, Rfl: 1 .  azithromycin (ZITHROMAX) 250 MG tablet, Take 2 tabs by  Mouth day 1 then 1 tab daily x 4 days, Disp: , Rfl:  .  Blood Glucose Monitoring Suppl (ACCU-CHEK AVIVA PLUS) w/Device KIT, USE AS DIRECTED, Disp: 1 kit, Rfl: 0 .  lamoTRIgine (LAMICTAL) 150 MG tablet, Take 150 mg by mouth daily.  , Disp: , Rfl:  .  levothyroxine (SYNTHROID, LEVOTHROID) 75 MCG tablet, Take 1 tablet (75 mcg total) by mouth daily before breakfast., Disp: 90 tablet, Rfl: 3 .   montelukast (SINGULAIR) 10 MG tablet, Take 1 tablet (10 mg total) by mouth at bedtime., Disp: 90 tablet, Rfl: 3 .  paroxetine mesylate (PEXEVA) 20 MG tablet, Take 20 mg by mouth daily.  , Disp: , Rfl:  .  propranolol (INDERAL) 20 MG tablet, Take 1 tablet (20 mg total) by mouth 2 (two) times daily., Disp: 180 tablet, Rfl: 1 .  ziprasidone (GEODON) 40 MG capsule, Take 60 mg by mouth daily. , Disp: , Rfl:   Current Facility-Administered Medications:  .  clindamycin (CLEOCIN) 2 % vaginal cream 1 Applicatorful, 1 Applicatorful, Vaginal, QHS, Jonnie Kind, MD      Objective:   Vitals:   05/05/18 1115  BP: 132/78  Pulse: 97  SpO2: 94%  Weight: 216 lb 3.2 oz (98.1 kg)  Height: '5\' 3"'$  (1.6 m)    Estimated body mass index is 38.3 kg/m as calculated from the following:   Height as of this encounter: '5\' 3"'$  (1.6 m).   Weight as of this encounter: 216 lb 3.2 oz (98.1 kg).  '@WEIGHTCHANGE'$ @  [employer]   05/05/18 1115  Weight: 216 lb 3.2 oz (98.1 kg)     Physical Exam Discussion only visit         Assessment:       ICD-10-CM   1. Shortness of breath R06.02   2. Ground glass opacity present on imaging of lung R91.8   3. History of smoking 10-25 pack years Z87.891   4. Coronary artery calcification seen on CAT scan I25.10        Plan:     Patient Instructions  Shortness of breath -Probably due to weight but also related possibly to either the abnormal CT scan or coronary artery calcification  Ground glass opacity present on imaging of lung-new finding  -Might be smoking-related -Need to rule out other chronic lung conditions - Serum: ESR, ACE, ANA, DS-DNA, RF, anti-CCP,  ANCA screen, MPO, PR-3, Total CK,  Aldolase,  ENP Panel ( ensure includes -> scl-70, ssA, ssB, anti-RNP, anti-JO-1) & Hypersensitivity Pneumonitis Panel  Multiple lung nodules -Stable 2009 through 2019 -No further follow-up  History of smoking 10-25 pack years  -We will discuss in detail at  future visits strategies to quit smoking but we do recognize any long-term damage smoking can cause  Coronary artery calcification seen on CAT scan  -Glad to have an upcoming appointment Dr. Acie Fredrickson to  address this issue  Follow-up -We will call with blood test results -Otherwise return in 2-3 months to see Dr. Chase Caller to regroup    > 50% of this > 25 min visit spent in face to face counseling or coordination of care - by this undersigned MD - Dr Brand Males. This includes one or more of the following documented above: discussion of test results, diagnostic or treatment recommendations, prognosis, risks and benefits of management options, instructions, education, compliance or risk-factor reduction    SIGNATURE    Dr. Brand Males, M.D., F.C.C.P,  Pulmonary and Critical Care Medicine Staff Physician, [place] Director - Interstitial Lung Disease  Program  Pulmonary [place] at [place], [place], [address]  Pager: [phone], If no answer or between  15:00h - 7:00h: call [phone] Telephone: [phone]  11:57 AM 05/05/2018

## 2018-05-05 NOTE — Patient Instructions (Addendum)
Shortness of breath -Probably due to weight but also related possibly to either the abnormal CT scan or coronary artery calcification  Ground glass opacity present on imaging of lung-new finding  -Might be smoking-related -Need to rule out other chronic lung conditions - Serum: ESR, ACE, ANA, DS-DNA, RF, anti-CCP,  ANCA screen, MPO, PR-3, Total CK,  Aldolase,  ENP Panel ( ensure includes -> scl-70, ssA, ssB, anti-RNP, anti-JO-1) & Hypersensitivity Pneumonitis Panel  Multiple lung nodules -Stable 2009 through 2019 -No further follow-up  History of smoking 10-25 pack years  -We will discuss in detail at future visits strategies to quit smoking but we do recognize any long-term damage smoking can cause  Coronary artery calcification seen on CAT scan  -Glad to have an upcoming appointment Dr. Acie Fredrickson to address this issue  Follow-up -We will call with blood test results -Otherwise return in 2-3 months to see Dr. Chase Caller to regroup

## 2018-05-06 LAB — RNP ANTIBODIES: ENA RNP Ab: >8 AI — ABNORMAL HIGH (ref 0.0–0.9)

## 2018-05-06 LAB — ANTI-JO 1 ANTIBODY, IGG: Anti JO-1: <0.2 AI (ref 0.0–0.9)

## 2018-05-07 ENCOUNTER — Telehealth: Payer: Self-pay | Admitting: Internal Medicine

## 2018-05-07 NOTE — Telephone Encounter (Signed)
thye are still in progess. Please let her know some of them can take a week. She is to call end of next week if not heard from [place]

## 2018-05-07 NOTE — Telephone Encounter (Signed)
Spoke with the pt and notified labs not resulted on yet  Please advise thanks

## 2018-05-07 NOTE — Telephone Encounter (Signed)
Called pt and advised message from the provider. Pt understood and verbalized understanding. Nothing further is needed.    

## 2018-05-10 ENCOUNTER — Telehealth: Payer: Self-pay | Admitting: Internal Medicine

## 2018-05-10 DIAGNOSIS — R768 Other specified abnormal immunological findings in serum: Secondary | ICD-10-CM

## 2018-05-10 LAB — MPO/PR-3 (ANCA) ANTIBODIES
Myeloperoxidase Abs: <1 AI
Serine Protease 3: <1 AI

## 2018-05-10 LAB — ANTI-SCLERODERMA ANTIBODY: Scleroderma (Scl-70) (ENA) Antibody, IgG: <1 AI

## 2018-05-10 LAB — ALDOLASE: Aldolase: 4.5 U/L (ref <=8.1)

## 2018-05-10 LAB — HYPERSENSITIVITY PNUEMONITIS PROFILE
ASPERGILLUS FUMIGATUS: NEGATIVE
Faenia retivirgula: NEGATIVE
Pigeon Serum: NEGATIVE
S. VIRIDIS: NEGATIVE
T. CANDIDUS: NEGATIVE
T. VULGARIS: NEGATIVE

## 2018-05-10 LAB — CK TOTAL AND CKMB (NOT AT ARMC)
CK, MB: <0.7 ng/mL (ref 0–5.0)
Total CK: 47 U/L (ref 29–143)

## 2018-05-10 LAB — ANGIOTENSIN CONVERTING ENZYME: Angiotensin-Converting Enzyme: 81 U/L — ABNORMAL HIGH (ref 9–67)

## 2018-05-10 LAB — RHEUMATOID FACTOR: Rhuematoid fact SerPl-aCnc: <14 IU/mL (ref <14)

## 2018-05-10 LAB — CYCLIC CITRUL PEPTIDE ANTIBODY, IGG: Cyclic Citrullin Peptide Ab: <16 UNITS

## 2018-05-10 LAB — ANTI-DNA ANTIBODY, DOUBLE-STRANDED: ds DNA Ab: 1 IU/mL

## 2018-05-10 LAB — SJOGREN'S SYNDROME ANTIBODS(SSA + SSB)
SSA (Ro) (ENA) Antibody, IgG: <1 AI
SSB (La) (ENA) Antibody, IgG: <1 AI

## 2018-05-10 LAB — ANCA SCREEN W REFLEX TITER: ANCA Screen: NEGATIVE

## 2018-05-10 NOTE — Telephone Encounter (Signed)
Few antibodies posiitve - not sure what they mean  Plan - refer rheumatology - not urgent   Results for Emily [name], Emily [name] (MRN [id number]) as of 05/10/2018 14:46  Ref. Range 05/05/2018 12:11  CK Total Latest Ref Range: 29 - 143 U/L 47  CK, MB Latest Ref Range: 0 - 5.0 ng/mL <0.7  Aldolase Latest Ref Range: < OR = 8.1 U/L 4.5  Sed Rate Latest Ref Range: 0 - 20 mm/hr 30 (H)  ASPERGILLUS FUMIGATUS Latest Ref Range: NEGATIVE  NEGATIVE  [place] Serum Latest Ref Range: NEGATIVE  NEGATIVE  Angiotensin-Converting Enzyme Latest Ref Range: 9 - 67 U/L 81 (H)  Anti JO-1 Latest Ref Range: 0.0 - 0.9 AI <0.2  Cyclic Citrullin Peptide Ab Latest Units: UNITS <16  ds DNA Ab Latest Units: IU/mL 1  ENA RNP Ab Latest Ref Range: 0.0 - 0.9 AI >8.0 (H)  Myeloperoxidase Abs Latest Units: AI <1.0  Serine Protease 3 Latest Units: AI <1.0  RA Latex Turbid. Latest Ref Range: <14 IU/mL <14  SSA (Ro) (ENA) Antibody, IgG Latest Ref Range: <1.0 NEG AI <1.0 NEG  SSB (La) (ENA) Antibody, IgG Latest Ref Range: <1.0 NEG AI <1.0 NEG  Scleroderma (Scl-70) (ENA) Antibody, IgG Latest Ref Range: <1.0 NEG AI <1.0 NEG

## 2018-05-10 NOTE — Telephone Encounter (Signed)
I spoke with the pt and notified of results and she verbalized understanding  I did not place the order for referral yet b/c we need a diagnosis to associate this with  Please advise what dx to use and will then place referral

## 2018-05-10 NOTE — Telephone Encounter (Signed)
Faitgue, arthralgia, ANA positive or autoimmune antibody positive

## 2018-05-10 NOTE — Telephone Encounter (Signed)
Spoke with the pt  She is requesting the lab results from 05/05/2018 Please advise, thanks

## 2018-05-10 NOTE — Telephone Encounter (Signed)
Referral made 

## 2018-05-12 ENCOUNTER — Telehealth: Payer: Self-pay | Admitting: Internal Medicine

## 2018-05-12 NOTE — Telephone Encounter (Signed)
Placing referral in proficient  for [facility].

## 2018-05-17 NOTE — Telephone Encounter (Signed)
MR please advise. Thanks! 

## 2018-05-17 NOTE — Telephone Encounter (Signed)
Got 4 messages each one for a separate antibody. [name] wants to know what positive ESR, RF, ACE and RNP mean? IT means she could have an autoimmune problem. This is why she is being referred to rheumatologist

## 2018-05-24 ENCOUNTER — Telehealth: Payer: Self-pay | Admitting: Internal Medicine

## 2018-05-24 NOTE — Telephone Encounter (Signed)
Called and spoke with Patient.  She stated that since she was told by Dr Marchelle Gearing that she had CAD, she has been very anxious. She stated that she has appointment with cardiology next week, but the anxiety she has is interfering with her life.  She is unable to sleep.  At 9-10pm every night, she starts feeling anxious, and is unable to sleep.  She is requesting recommendations or a prescription to help with her nerves. Patient is aware that it may be 05/25/18, before someone calls her back with recommendations.   Message routed to Dr Marchelle Gearing

## 2018-05-24 NOTE — Telephone Encounter (Signed)
I understand the test result is causing anxiety. This is called situational anxiety. The anxiety piece has to be treated by PCP Emily [name], [facility]

## 2018-05-25 NOTE — Telephone Encounter (Signed)
Pt is calling back [phone]

## 2018-05-25 NOTE — Telephone Encounter (Signed)
Called and spoke with patient, advised her of response below from MR. Patient verbalized understanding. Nothing further needed.

## 2018-05-25 NOTE — Telephone Encounter (Signed)
Called patient, unable to reach left message to give us a call back. 

## 2018-06-01 ENCOUNTER — Other Ambulatory Visit: Payer: Self-pay | Admitting: Cardiovascular Disease

## 2018-06-01 ENCOUNTER — Ambulatory Visit: Payer: Medicaid Other | Admitting: Cardiovascular Disease

## 2018-06-01 ENCOUNTER — Encounter: Payer: Self-pay | Admitting: Cardiovascular Disease

## 2018-06-01 VITALS — BP 108/76 | HR 76 | Ht 63.0 in | Wt 215.0 lb

## 2018-06-01 DIAGNOSIS — I200 Unstable angina: Secondary | ICD-10-CM

## 2018-06-01 DIAGNOSIS — I10 Essential (primary) hypertension: Secondary | ICD-10-CM

## 2018-06-01 DIAGNOSIS — E785 Hyperlipidemia, unspecified: Secondary | ICD-10-CM | POA: Diagnosis not present

## 2018-06-01 MED ORDER — ASPIRIN EC 81 MG PO TBEC: 81.0000 mg | DELAYED_RELEASE_TABLET | Freq: Every day | ORAL | Status: DC

## 2018-06-01 MED ORDER — NITROGLYCERIN 0.4 MG SL SUBL: 0.4000 mg | SUBLINGUAL_TABLET | SUBLINGUAL | 6 refills | Status: DC | PRN

## 2018-06-01 MED ORDER — PREDNISONE 50 MG PO TABS: ORAL_TABLET | ORAL | 0 refills | Status: DC

## 2018-06-01 NOTE — Patient Instructions (Addendum)
Medication Instructions:  Your physician has recommended you make the following change in your medication:  USE Nitroglycerin 0.4 mg under tongue for chest discomfort You may repeat 5 minutes and again 10 minutes later if pain persists, call 911 START Aspirin 81 mg once daily - take on the morning of your cath  If you need a refill on your cardiac medications before your next appointment, please call your pharmacy.    Lab work: TODAY - CBC, BMET, urine pregnancy   Testing/Procedures: None Ordered   Follow-Up: At [facility], you and your health needs are our priority.  As part of our continuing mission to provide you with exceptional heart care, we have created designated Provider Care Teams.  These Care Teams include your primary Cardiologist (physician) and Advanced Practice Providers (APPs -  Physician Assistants and Nurse Practitioners) who all work together to provide you with the care you need, when you need it. You will need a follow up appointment in:  4 weeks. You may see Kristeen Miss, MD or one of the following Advanced Practice Providers on your designated Care Team: Tereso Newcomer, PA-C Vin [place], [place] . Berton Bon, NP   You are scheduled for a Cardiac Catheterization on Thursday, February 13 with Dr. Cristal Deer End.  1. Please arrive at the [facility] (Main Entrance A) at [facility]: [address] [place], [place] [address] at 8:00 AM (This time is two hours before your procedure to ensure your preparation). Free valet parking service is available.   Special note: Every effort is made to have your procedure done on time. Please understand that emergencies sometimes delay scheduled procedures.  2. Diet: Do not eat solid foods after midnight.  The patient may have clear liquids until 5am upon the day of the procedure.  3. Labs: You will need to have blood drawn on Tuesday, February 11 at [facility] at [facility]. [address],  [place]  Open: 7:30am - 5pm    Phone: [phone]. You do not need to be fasting.  4. Medication instructions in preparation for your procedure:   Contrast Allergy: Yes, Please take Prednisone 50mg  by mouth at: Thirteen hours prior to cath 6:00pm on Wednesday Seven hours prior to cath 12:00am on Thursday And prior to leaving home please take last dose of Prednisone 50mg  and Benadryl 50mg  by mouth.   On the morning of your procedure, take your Aspirin and any morning medicines NOT listed above.  You may use sips of water.  5. Plan for one night stay--bring personal belongings. 6. Bring a current list of your medications and current insurance cards. 7. You MUST have a responsible person to drive you home. 8. Someone MUST be with you the first 24 hours after you arrive home or your discharge will be delayed. 9. Please wear clothes that are easy to get on and off and wear slip-on shoes.  Thank you for allowing [place] to care for you!   -- [facility]

## 2018-06-01 NOTE — Progress Notes (Signed)
Cardiology Office Note:    Date:  06/01/2018   ID:  Emily [name], DOB [birth year], MRN [id number]  PCP:  Harlene Salts, PA-C  Cardiologist:  Mertie Moores, MD  Electrophysiologist:  None   Problem list 1.  Coronary artery calcifications 2.  Hyperlipidemia 3.  Hypertension 4.  Hypothyroidism 5.  Diabetes mellitus  Referring MD: [facility] *   Chief Complaint  Patient presents with  . Coronary Artery Disease    coronary artery calcifications  . Hypertension     Feb. 11, 2020    Emily [name] is a 38 y.o. female with a hx of hypertension and hyperlipidemia.   Was seen with her sister Emily [name].   She recently had a CT scan of the lungs and was incidentally found to have coronary artery calcifications. We are asked to see her today by Harlene Salts, PA-C for further evaluations of these coronary artery calcifications   [name] had a CT scan for further evaluation of some shortness of breath.  The shortness of breath seems to be worsening over the past several months.  She was incidentally found to have coronary artery calcifications.  Has occasional chest tightness/ pressure like sensation  with exertion.     She has these episodes of tightness whenever she does anything exertional such as climbing stairs or walking from the parking lot into a store. The pain is described as a pressure-like pain or a tightness.  It last for as long as she is walking and then resolves after several minutes after she stops walking.  She is never had any prolonged episodes.  Does not do any regular exercise   Eats lots of junk food.    Does not pay attention to her diet Goes to [employer] lots.    Eats late at night  is not working  - trying to get disability .    + family hx of CAD  - mother and father both had cad   Past Medical History:  Diagnosis Date  . Abnormal CT of liver   . Anxiety   . Asthma   . Asthma   . Bipolar 1 disorder ([place])   . Bipolar disorder  ([place])   . Chronic pain   . Depression   . Fatty liver   . GERD (gastroesophageal reflux disease)   . Headache(784.0)   . Hyperlipidemia   . Hypertension   . Pulmonary nodule   . Tachycardia   . Thyroid disease     Past Surgical History:  Procedure Laterality Date  . DILATION AND CURETTAGE OF UTERUS     1 yr ago    Current Medications: Current Meds  Medication Sig  . ACCU-CHEK FASTCLIX LANCETS MISC USE AS DIRECTED  . albuterol (PROVENTIL HFA;VENTOLIN HFA) 108 (90 Base) MCG/ACT inhaler Inhale 2 puffs into the lungs every 4 (four) hours as needed.  Marland Kitchen amLODipine (NORVASC) 10 MG tablet TAKE 1 TABLET (10 MG TOTAL) BY MOUTH DAILY.  Marland Kitchen Blood Glucose Monitoring Suppl (ACCU-CHEK AVIVA PLUS) w/Device KIT USE AS DIRECTED  . lamoTRIgine (LAMICTAL) 150 MG tablet Take 150 mg by mouth daily.    Marland Kitchen levothyroxine (SYNTHROID, LEVOTHROID) 75 MCG tablet Take 1 tablet (75 mcg total) by mouth daily before breakfast.  . montelukast (SINGULAIR) 10 MG tablet Take 1 tablet (10 mg total) by mouth at bedtime.  . paroxetine mesylate (PEXEVA) 20 MG tablet Take 20 mg by mouth daily.    . propranolol (INDERAL) 20 MG tablet Take 1  tablet (20 mg total) by mouth 2 (two) times daily.  . ziprasidone (GEODON) 40 MG capsule Take 60 mg by mouth daily.    Current Facility-Administered Medications for the 06/01/18 encounter (Office Visit) with Caddie Randle, Wonda Cheng, MD  Medication  . clindamycin (CLEOCIN) 2 % vaginal cream 1 Applicatorful     Allergies:   Bactrim [sulfamethoxazole-trimethoprim]; Ciprofloxacin; Flagyl [metronidazole]; Iodinated diagnostic agents; Doxycycline; Penicillins; and Prednisone   Social History   Socioeconomic History  . Marital status: Single    Spouse name: Not on file  . Number of children: Not on file  . Years of education: Not on file  . Highest education level: Not on file  Occupational History  . Not on file  Social Needs  . Financial resource strain: Not on file  . Food insecurity:      Worry: Not on file    Inability: Not on file  . Transportation needs:    Medical: Not on file    Non-medical: Not on file  Tobacco Use  . Smoking status: Current Every Day Smoker    Packs/day: 1.00    Years: 19.00    Pack years: 19.00    Types: Cigarettes  . Smokeless tobacco: Never Used  Substance and Sexual Activity  . Alcohol use: No  . Drug use: No  . Sexual activity: Not Currently    Partners: Male    Birth control/protection: Condom  Lifestyle  . Physical activity:    Days per week: Not on file    Minutes per session: Not on file  . Stress: Not on file  Relationships  . Social connections:    Talks on phone: Not on file    Gets together: Not on file    Attends religious service: Not on file    Active member of club or organization: Not on file    Attends meetings of clubs or organizations: Not on file    Relationship status: Not on file  Other Topics Concern  . Not on file  Social History Narrative  . Not on file     Family History: The patient's family history includes Asthma in her mother; Diabetes in her maternal grandmother; Hypertension in her maternal grandmother.  ROS:   Please see the history of present illness.     All other systems reviewed and are negative.  EKGs/Labs/Other Studies Reviewed:    The following studies were reviewed today:   EKG:   Feb. 11, 2020   - NSR at 76    NS ST abn in the inferior and anterior leads.   Recent Labs: No results found for requested labs within last 8760 hours.  Recent Lipid Panel    Component Value Date/Time   CHOL 251 (H) 02/20/2015 0001   TRIG 516 (H) 02/20/2015 0001   HDL 29 (L) 02/20/2015 0001   CHOLHDL 8.7 (H) 02/20/2015 0001   VLDL NOT CALC 02/20/2015 0001   LDLCALC NOT CALC 02/20/2015 0001    Physical Exam:    VS:  BP 108/76   Pulse 76   Ht '5\' 3"'$  (1.6 m)   Wt 215 lb (97.5 kg)   SpO2 93%   BMI 38.09 kg/m     Wt Readings from Last 3 Encounters:  06/01/18 215 lb (97.5 kg)  05/05/18  216 lb 3.2 oz (98.1 kg)  03/24/18 214 lb (97.1 kg)     GEN:   Obese , young female,   NAD    HEENT: Normal NECK: No JVD; No  carotid bruits LYMPHATICS: No lymphadenopathy CARDIAC:   RR   RESPIRATORY:  Clear to auscultation without rales, wheezing or rhonchi  ABDOMEN: Soft, non-tender, non-distended MUSCULOSKELETAL:  No edema;  Radial pulse is 1-2 +  SKIN: Warm and dry NEUROLOGIC:  Alert and oriented x 3 PSYCHIATRIC:  Normal affect   ASSESSMENT:    1. Unstable angina ([place])   2. Hyperlipidemia, unspecified hyperlipidemia type   3. Essential hypertension    PLAN:    In order of problems listed above:  1. 1.  Unstable angina: Wyatt Mage presents today for the evaluation of coronary calcifications that were found incidentally on CT scan.  Talking to her, it is clear that she has symptoms that are worrisome for unstable angina.  Episodes of chest tightness with any sort of exertion.  She does not exercise at all.   He has multiple risk factors for coronary artery disease including coronary artery calcifications, diabetes mellitus, obesity, hyperlipidemia, hypertension.  I think that we need to refer her for heart catheterization.  We discussed the risks, benefits, options of heart catheterization.  She understands and agrees to proceed. We need to check a pregnancy test.  We will check labs today.  She has a contrast allergy so we will give her prednisone prior to the heart cath.  Start her on aspirin 81 mg a day.  2.  Hyperlipidemia: She eats a very poor diet.  She does not exercise.  We have a long discussion about ways to improve her lifestyle.  3.  Cigarette smoking: I have strongly advised cessation of smoking.   Medication Adjustments/Labs and Tests Ordered: Current medicines are reviewed at length with the patient today.  Concerns regarding medicines are outlined above.  Orders Placed This Encounter  Procedures  . CBC  . Basic Metabolic Panel (BMET)  . Pregnancy, urine  .  EKG 12-Lead   Meds ordered this encounter  Medications  . nitroGLYCERIN (NITROSTAT) 0.4 MG SL tablet    Sig: Place 1 tablet (0.4 mg total) under the tongue every 5 (five) minutes as needed for chest pain.    Dispense:  25 tablet    Refill:  6  . predniSONE (DELTASONE) 50 MG tablet    Sig: Take 1 pill at 13 hours, 7 hours, and 1 hour prior to cardiac catheterization    Dispense:  3 tablet    Refill:  0  . aspirin EC 81 MG tablet    Sig: Take 1 tablet (81 mg total) by mouth daily.    Patient Instructions  Medication Instructions:  Your physician has recommended you make the following change in your medication:  USE Nitroglycerin 0.4 mg under tongue for chest discomfort You may repeat 5 minutes and again 10 minutes later if pain persists, call 911 START Aspirin 81 mg once daily - take on the morning of your cath  If you need a refill on your cardiac medications before your next appointment, please call your pharmacy.    Lab work: TODAY - CBC, BMET, urine pregnancy   Testing/Procedures: None Ordered   Follow-Up: At [facility], you and your health needs are our priority.  As part of our continuing mission to provide you with exceptional heart care, we have created designated Provider Care Teams.  These Care Teams include your primary Cardiologist (physician) and Advanced Practice Providers (APPs -  Physician Assistants and Nurse Practitioners) who all work together to provide you with the care you need, when you need it. You will need a follow  up appointment in:  4 weeks. You may see Mertie Moores, MD or one of the following Advanced Practice Providers on your designated Care Team: Richardson Dopp, PA-C [place], [place] . Daune Perch, NP   You are scheduled for a Cardiac Catheterization on Thursday, February 13 with Dr. Harrell Gave End.  1. Please arrive at the [facility] (Main Entrance A) at [facility]: [place], [place] [address] at 8:00 AM (This  time is two hours before your procedure to ensure your preparation). Free valet parking service is available.   Special note: Every effort is made to have your procedure done on time. Please understand that emergencies sometimes delay scheduled procedures.  2. Diet: Do not eat solid foods after midnight.  The patient may have clear liquids until 5am upon the day of the procedure.  3. Labs: You will need to have blood drawn on Tuesday, February 11 at [facility]. [address]  Open: 7:30am - 5pm    Phone: [phone]. You do not need to be fasting.  4. Medication instructions in preparation for your procedure:   Contrast Allergy: Yes, Please take Prednisone '50mg'$  by mouth at: Thirteen hours prior to cath 6:00pm on Wednesday Seven hours prior to cath 12:00am on Thursday And prior to leaving home please take last dose of Prednisone '50mg'$  and Benadryl '50mg'$  by mouth.   On the morning of your procedure, take your Aspirin and any morning medicines NOT listed above.  You may use sips of water.  5. Plan for one night stay--bring personal belongings. 6. Bring a current list of your medications and current insurance cards. 7. You MUST have a responsible person to drive you home. 8. Someone MUST be with you the first 24 hours after you arrive home or your discharge will be delayed. 9. Please wear clothes that are easy to get on and off and wear slip-on shoes.  Thank you for allowing [place] to care for you!   -- [facility] Invasive Cardiovascular services      Signed, Mertie Moores, MD  06/01/2018 10:56 AM    [place]

## 2018-06-02 ENCOUNTER — Telehealth: Payer: Self-pay | Admitting: *Deleted

## 2018-06-02 LAB — PREGNANCY, URINE: Preg Test, Ur: NEGATIVE

## 2018-06-02 NOTE — Telephone Encounter (Signed)
Patient notified of result.  Please refer to phone note from today for complete details.  Pt has been notified of her lab results by phone with verbal understanding. Confirmed appt 06/28/18 with Dr. Elease Hashimoto. Pt thanked me for the call.  Danielle Rankin, [facility] 06/02/2018 2:58 PM

## 2018-06-02 NOTE — Telephone Encounter (Signed)
I have been unable to contact the patient by telephone to discuss cardiac cath or alternate testing. After discussion with Dr Elease HashimotoNahser, I have cancelled the cardiac cath scheduled for tomorrow, left a message for patient on phone number listed that I have cancelled cardiac cath for tomorrow, please call our office to discuss rescheduling cardiac cath or alternate testing.

## 2018-06-02 NOTE — Telephone Encounter (Signed)
-----   Message from Vesta MixerPhilip J Nahser, MD sent at 06/02/2018  8:23 AM EST ----- Hb is elevated.  Otherwise labs were stable

## 2018-06-02 NOTE — Telephone Encounter (Signed)
Pt contacted pre-catheterization scheduled at [facility] for: Thursday June 03, 2018 10:30 AM Verified arrival time and place: [facility] Main Entrance A at: 8 AM  No solid food after midnight prior to cath, clear liquids until 5 AM day of procedure.  Contrast allergy: yes-13 hour prednisone and benadryl prep. Prednisone 50 mg 06/02/18 9:30 PM Prednisone 50 mg 06/03/18 3:30 AM Prednisone 50 mg and benadryl 50 mg just prior to leaving for hospital 06/03/18  AM meds can be  taken pre-cath with sip of water including: ASA 81 mg  Confirm patient has responsible person to drive home post procedure and observe 24 hours after arriving home.  I left message for patient to call back to discuss and review instructions for cath.  See Patient Message 06/01/18. Per Dr Francisca December attempted to contact patient last night to discuss cath but did not get an answer. If patient does cancel cardiac cath, Dr Elease Hashimoto  would recommend a coronary CTA but she may still need cardiac cath if significant findings on coronary CTA.

## 2018-06-03 ENCOUNTER — Telehealth: Payer: Self-pay | Admitting: Cardiovascular Disease

## 2018-06-03 ENCOUNTER — Encounter (HOSPITAL_COMMUNITY): Payer: Self-pay

## 2018-06-03 ENCOUNTER — Ambulatory Visit (HOSPITAL_COMMUNITY): Admit: 2018-06-03 | Payer: Medicaid Other | Admitting: Internal Medicine

## 2018-06-03 LAB — BASIC METABOLIC PANEL
BUN/Creatinine Ratio: 7 — ABNORMAL LOW (ref 9–23)
BUN: 5 mg/dL — ABNORMAL LOW (ref 6–20)
CO2: 18 mmol/L — ABNORMAL LOW (ref 20–29)
Calcium: 9.5 mg/dL (ref 8.7–10.2)
Chloride: 100 mmol/L (ref 96–106)
Creatinine, Ser: 0.7 mg/dL (ref 0.57–1.00)
GFR calc Af Amer: 129 mL/min/{1.73_m2} (ref >59)
GFR calc non Af Amer: 112 mL/min/{1.73_m2} (ref >59)
Glucose: 132 mg/dL — ABNORMAL HIGH (ref 65–99)
Potassium: 4.2 mmol/L (ref 3.5–5.2)
Sodium: 136 mmol/L (ref 134–144)

## 2018-06-03 LAB — CBC
Hematocrit: 45.9 % (ref 34.0–46.6)
Hemoglobin: 16.2 g/dL — ABNORMAL HIGH (ref 11.1–15.9)
MCH: 32.8 pg (ref 26.6–33.0)
MCHC: 35.3 g/dL (ref 31.5–35.7)
MCV: 93 fL (ref 79–97)
Platelets: 313 10*3/uL (ref 150–450)
RBC: 4.94 x10E6/uL (ref 3.77–5.28)
RDW: 11.7 % (ref 11.7–15.4)
WBC: 12.3 10*3/uL — ABNORMAL HIGH (ref 3.4–10.8)

## 2018-06-03 SURGERY — LEFT HEART CATH AND CORONARY ANGIOGRAPHY
Anesthesia: LOCAL

## 2018-06-03 NOTE — Telephone Encounter (Signed)
New Message    Patient of Dr. Harvie Bridge would like to start seeing Dr. Eden Emms since he se's her father, and wants to make sure it would okay with both doctors.

## 2018-06-03 NOTE — Telephone Encounter (Signed)
Why did her cath get cancelled today ??

## 2018-06-04 NOTE — Telephone Encounter (Signed)
Emily [name] presented with exertional CP / chest tightness.   She has multiple risk factors ( HTN, DM, HLD, family history, smoker )  And has baseline ECG changes. We discussed  Coronary CT angio and cath and I eventually settled on cath given her symptoms. After the visit, she wrote back in a MyChart message that she was scared and wanted to do the CTA.  We have tried multiple times to call and discuss with her but have not been able to reach her.   I agree with doing a CTA if she will not agree to a cath.  Unfortunately, there is a 3-6  week delay to get these done.   We have sent in a script for NTG and have advised her to start ASA 81 mg a day. If she has worsening CP she needs to call 911   I'm fine with her seeing whoever she wants to see.

## 2018-06-08 ENCOUNTER — Telehealth: Payer: Self-pay | Admitting: *Deleted

## 2018-06-08 ENCOUNTER — Other Ambulatory Visit: Payer: Self-pay | Admitting: Cardiovascular Disease

## 2018-06-08 DIAGNOSIS — I200 Unstable angina: Secondary | ICD-10-CM

## 2018-06-08 NOTE — Telephone Encounter (Signed)
Pt contacted pre-catheterization scheduled at [facility] for: Thursday June 10, 2018 1:30 PM Verified arrival time and place: [facility] Main Entrance A at: 11:30 AM  No solid food after midnight prior to cath, clear liquids until 5 AM day of procedure.  Contrast allergy: yes-13 hour Prednisone and Benadryl Prep reviewed with patient: 06/10/18 12:30 AM Prednisone 50 mg 06/10/18 6:30 AM Prednisone 50 mg 06/10/18 just prior to leaving for hospital-Prednisone 50 mg and Benadryl 50 mg Pt advised not to drive to hospital.  AM meds can be  taken pre-cath with sip of water including: ASA 81 mg  Confirmed patient has responsible person to drive home post procedure and observe 24 hours after arriving home: yes

## 2018-06-10 ENCOUNTER — Ambulatory Visit (HOSPITAL_COMMUNITY): Admission: RE | Admit: 2018-06-10 | Payer: Medicaid Other | Source: Home / Self Care | Admitting: Cardiology

## 2018-06-10 ENCOUNTER — Encounter (HOSPITAL_COMMUNITY): Admission: RE | Payer: Self-pay | Source: Home / Self Care

## 2018-06-10 SURGERY — LEFT HEART CATH AND CORONARY ANGIOGRAPHY
Anesthesia: LOCAL

## 2018-06-14 NOTE — Progress Notes (Deleted)
Office Visit Note  Patient: Emily [name]             Date of Birth: Oct 10, 1980           MRN: [id number]             PCP: Domenick Bookbinder, PA-C Referring: Kalman Shan, MD Visit Date: 06/28/2018 Occupation: @GUAROCC @  Subjective:  No chief complaint on file.   History of Present Illness: BESTY [name] is a 38 y.o. female ***   Activities of Daily Living:  Patient reports morning stiffness for *** {minute/hour:19697}.   Patient {ACTIONS;DENIES/REPORTS:21021675::"Denies"} nocturnal pain.  Difficulty dressing/grooming: {ACTIONS;DENIES/REPORTS:21021675::"Denies"} Difficulty climbing stairs: {ACTIONS;DENIES/REPORTS:21021675::"Denies"} Difficulty getting out of chair: {ACTIONS;DENIES/REPORTS:21021675::"Denies"} Difficulty using hands for taps, buttons, cutlery, and/or writing: {ACTIONS;DENIES/REPORTS:21021675::"Denies"}  No Rheumatology ROS completed.   PMFS History:  Patient Active Problem List   Diagnosis Date Noted  . 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  . Vaginal odor 09/30/2017  . BV (bacterial vaginosis) 09/30/2017  . Swollen lymph nodes 01/06/2017  . Vaginal irritation 10/08/2016  . RUQ pain 10/08/2016  . LLQ pain 10/08/2016  . Vaginal discharge 10/08/2016  . Menstrual period late 10/08/2016  . Cyst of right Bartholin's gland 12/17/2015  . Trichomonal vaginitis 10/16/2015  . Recurrent boils 06/18/2015  . Type 2 diabetes mellitus with complication, without long-term current use of insulin (HCC) 04/18/2015  . Need for influenza vaccination 04/18/2015  . Ingrowing toenail 04/18/2015  . Vitamin D deficiency 04/18/2015  . Angular cheilitis 04/18/2015  . Dyslipidemia 04/18/2015  . Diabetes mellitus with complication in adult patient (HCC) 02/20/2015  . Smoker 02/20/2015  . Essential hypertension 02/20/2015  . Pain in limb 02/20/2015  . Myalgia 02/20/2015  . Other specified hypothyroidism 02/20/2015  . Leg pain, inferior 02/20/2015  . Toenail deformity  02/20/2015  . Elevated liver enzymes 01/07/2012  . Abnormal CT of liver 09/30/2011  . Fatty liver 09/30/2011  . G E REFLUX 11/30/2007  . CHEST PAIN-UNSPECIFIED 11/30/2007  . HEADACHE 10/21/2007  . DYSPNEA 09/15/2007  . UNSPECIFIED TACHYCARDIA 06/01/2007  . COUGH 05/24/2007  . URINARY INCONTINENCE 05/24/2007  . TOBACCO ABUSE 05/20/2007  . ASTHMATIC BRONCHITIS, ACUTE 05/18/2007  . PULMONARY NODULE 05/12/2007  . DEPRESSION 04/19/2007  . ASTHMA 04/19/2007    Past Medical History:  Diagnosis Date  . Abnormal CT of liver   . Anxiety   . Asthma   . Asthma   . Bipolar 1 disorder (HCC)   . Bipolar disorder (HCC)   . Chronic pain   . Depression   . Fatty liver   . GERD (gastroesophageal reflux disease)   . Headache(784.0)   . Hyperlipidemia   . Hypertension   . Pulmonary nodule   . Tachycardia   . Thyroid disease     Family History  Problem Relation Age of Onset  . Asthma Mother   . Diabetes Maternal Grandmother   . Hypertension Maternal Grandmother    Past Surgical History:  Procedure Laterality Date  . DILATION AND CURETTAGE OF UTERUS     1 yr ago   Social History   Social History Narrative  . Not on file   Immunization History  Administered Date(s) Administered  . Influenza Whole 01/13/2008  . Influenza,inj,Quad PF,6+ Mos 04/18/2015     Objective: Vital Signs: There were no vitals taken for this visit.   Physical Exam   Musculoskeletal Exam: ***  CDAI Exam: CDAI Score: Not documented Patient Global Assessment: Not documented; Provider Global Assessment: Not documented Swollen: Not documented; Tender: Not  documented Joint Exam   Not documented   There is currently no information documented on the homunculus. Go to the Rheumatology activity and complete the homunculus joint exam.  Investigation: Findings:  05/05/18: sed rat 30, ACE 81, dsDNA 1, RF <14, CCP <16, ANCA negative, aldolase 4.5, CK 42, Anti-Jo-1 negative, Ro-, La-, RNP<8, Scl-70  negative  Component     Latest Ref Rng & Units 05/05/2018  CK Total     29 - 143 U/L 47  CK, MB     0 - 5.0 ng/mL <0.7  Relative Index     0 - 4.0   SSA (Ro) (ENA) Antibody, IgG     <1.0 NEG AI <1.0 NEG  SSB (La) (ENA) Antibody, IgG     <1.0 NEG AI <1.0 NEG  Myeloperoxidase Abs     AI <1.0  Serine Protease 3     AI <1.0  Anti JO-1     0.0 - 0.9 AI <0.2  ENA RNP Ab     0.0 - 0.9 AI >8.0 (H)  Scleroderma (Scl-70) (ENA) Antibody, IgG     <1.0 NEG AI <1.0 NEG  Aldolase     < OR = 8.1 U/L 4.5  ANCA SCREEN     NEGATIVE NEGATIVE  Cyclic Citrullin Peptide Ab     UNITS <16  RA Latex Turbid.     <14 IU/mL <14  ds DNA Ab     IU/mL 1  Angiotensin-Converting Enzyme     9 - 67 U/L 81 (H)  Sed Rate     0 - 20 mm/hr 30 (H)   Imaging: No results found.  Recent Labs: Lab Results  Component Value Date   WBC 12.3 (H) 06/01/2018   HGB 16.2 (H) 06/01/2018   PLT 313 06/01/2018   NA 136 06/01/2018   K 4.2 06/01/2018   CL 100 06/01/2018   CO2 18 (L) 06/01/2018   GLUCOSE 132 (H) 06/01/2018   BUN 5 (L) 06/01/2018   CREATININE 0.70 06/01/2018   BILITOT 0.6 02/20/2015   ALKPHOS 147 (H) 02/20/2015   AST 35 (H) 02/20/2015   ALT 40 (H) 02/20/2015   PROT 7.2 02/20/2015   ALBUMIN 4.1 02/20/2015   CALCIUM 9.5 06/01/2018   GFRAA 129 06/01/2018    Speciality Comments: No specialty comments available.  Procedures:  No procedures performed Allergies: Bactrim [sulfamethoxazole-trimethoprim]; Ciprofloxacin; Flagyl [metronidazole]; Iodinated diagnostic agents; Doxycycline; Penicillins; and Prednisone   Assessment / Plan:     Visit Diagnoses: Positive ANA (antinuclear antibody)  Essential hypertension  History of asthma  Pulmonary nodule  Dyslipidemia  Recurrent boils  Vitamin D deficiency  Angular cheilitis  History of gastroesophageal reflux (GERD)  Fatty liver  Type 2 diabetes mellitus with complication, without long-term current use of insulin (HCC)  History of  hypothyroidism  Smoker  History of bipolar disorder   Orders: No orders of the defined types were placed in this encounter.  No orders of the defined types were placed in this encounter.   Face-to-face time spent with patient was *** minutes. Greater than 50% of time was spent in counseling and coordination of care.  Follow-Up Instructions: No follow-ups on file.   Gearldine Bienenstock, PA-C  Note - This record has been created using Dragon software.  Chart creation errors have been sought, but may not always  have been located. Such creation errors do not reflect on  the standard of medical care.

## 2018-06-28 ENCOUNTER — Ambulatory Visit: Payer: Medicaid Other | Admitting: Cardiovascular Disease

## 2018-06-28 ENCOUNTER — Ambulatory Visit: Payer: Medicaid Other | Admitting: Rheumatology

## 2018-06-30 NOTE — Telephone Encounter (Signed)
New Message   Hello Dr. Eden Emms will it be ok for the patient to start seeing you?

## 2018-07-04 NOTE — Telephone Encounter (Signed)
She should f/u with the recommendations of Dr Elease Hashimoto Don't really want to see someone who we can't get a hold of and doesn't follow through with recommendations

## 2018-07-12 ENCOUNTER — Other Ambulatory Visit: Payer: Self-pay | Admitting: Obstetrics and Gynecology

## 2018-07-12 MED ORDER — CLINDAMYCIN PHOSPHATE 2 % VA CREA: 1.0000 | TOPICAL_CREAM | Freq: Every day | VAGINAL | 0 refills | Status: DC

## 2018-07-12 NOTE — Progress Notes (Signed)
Having vaginal d/c, will rx clindamycin VC which has worked in the past.

## 2018-07-13 ENCOUNTER — Other Ambulatory Visit: Payer: Self-pay | Admitting: Obstetrics and Gynecology

## 2018-07-13 MED ORDER — CLINDAMYCIN HCL 150 MG PO CAPS: 150.0000 mg | ORAL_CAPSULE | Freq: Three times a day (TID) | ORAL | 1 refills | Status: DC

## 2018-07-13 NOTE — Progress Notes (Signed)
Clindamycin switched to oral therapy by pt request.

## 2018-07-22 ENCOUNTER — Ambulatory Visit: Payer: Medicaid Other | Admitting: Rheumatology

## 2018-08-04 NOTE — Progress Notes (Signed)
Virtual Visit via Telephone Note  I connected with [place] on 08/05/18 at  1:30 PM EDT by telephone and verified that I am speaking with the correct person using two identifiers.   I discussed the limitations, risks, security and privacy concerns of performing an evaluation and management service by telephone and the availability of in person appointments. I also discussed with the patient that there may be a patient responsible charge related to this service. The patient expressed understanding and agreed to proceed.   History of Present Illness: 38 year old female smoker followed in our office for abnormal CT scans Past medical history: Type 2 diabetes, GERD, obesity, hyperlipidemia, hypertension Current every day smoker, smoking 1 pack/day, 19-pack-year smoking history Patient of Dr. Chase Caller  Patient consented to consult via telephone: Yes  People present and their role in pt care: Pt   Chief complaint: Shortness of Breath   38 year old female current every day smoker (smoking 1 pack/day) completing tele-visit with our office today.  Patient reports that her shortness of breath has not improved since last office visit.  She continues to remain short of breath.  She has not seen rheumatology yet.  She reports that this was rescheduled due to the COVID-19 pandemic.  Chart review reveals the patient has scheduled appointment with rheumatology on 09/06/2018 at 945.  Patient reporting she uses her albuterol nebulized medication about every 2 to 3 days.  Sometimes more if she is short of breath or actively sick.  She does not have a rescue inhaler at home.  She is wondering if 1 can be sent in today.  Smoking assessment and cessation counseling  Patient currently smoking: 1 pack/day I have advised the patient to quit/stop smoking as soon as possible due to high risk for multiple medical problems.  It will also be very difficult for [place] to manage patient's  respiratory symptoms and  status if we continue to expose her lungs to a known irritant.  We do not advise e-cigarettes as a form of stopping smoking.  Patient is willing to quit smoking.  Patient has not set a quit date  I have advised the patient that we can assist and have options of nicotine replacement therapy, provided smoking cessation education today, provided smoking cessation counseling, and provided cessation resources.  Patient is willing to try nicotine replacement therapies 20 mg nicotine patch as well as 4 mg lozenges.  Follow-up next office visit office visit for assessment of smoking cessation.  Smoking cessation counseling advised for: 6 min     Observations/Objective:  05/05/2018  Ace - 81 Esr - 30 Rh<14 CCP<16 ANCA - negative  Hypersensitive activity pneumonitis panel negative Anti-Jo 1 less than 0.2 RNP antibodies greater than 8 Sjogren's syndrome antibodies negative Anti-scleroderma antibody less than 1 Aldolase 4.5 MPO/PR-3 (ANCA) antibodies-less than 1 Total CK 47, CK-MB less than 7 -negative   04/01/2018-CT chest high resolution- few patchy areas of mild groundglass attenuation in the lungs bilaterally nonspecific, could consider repeating CT in 12 months to further assess, stable pulmonary nodules from 2009 consider definitely benign, aortic arthrosclerosis  01/08/2015- spirometry- FVC 1.33 (38% predicted), ratio 72, FEV1 1.01 (37% predicted)  Lab Results  Component Value Date   NITRICOXIDE 5 01/26/2018    Assessment and Plan:  Abnormal finding on lung imaging Assessment: FVC 1.33 (38% predicted) on 2016 spirometry High-resolution CT in December/2019 showing a few patchy areas of GGO January/2020 ILD labs show a's of 81, ESR 30, RNP antibodies greater than 8 Pending  referral to rheumatology patient currently scheduled in May/2020  Plan: Continue with planned rheumatology appointment Scheduling patient for full PFTs to be completed in July Follow-up in July with Wyn Quaker, FNP  Shortness of breath Assessment: Persistent shortness of breath Likely due to patient's body habitus Pending rheumatology referral based off of a few elevated ILD labs 2016 spirometry shows a severely reduced FVC Patient continues to smoke  Plan: Full PFTs to be completed in July/2020 Follow-up with Wyn Quaker in July/2020 Follow-up with Dr. Chase Caller in September/2020 Keep scheduled follow-up with rheumatology in May/2020 Actively work on improving daily diet as well as physical activity You need to stop smoking   TOBACCO ABUSE Assessment: Current every day smoker Smoking 1 pack/day 19-pack-year smoking history  Plan: You need to stop smoking You need to set a quit date Prescription for 21 mg nicotine patch prescribed today Prescription for 4 mg nicotine lozenge prescribed today  Follow Up Instructions:  Return in about 3 months (around 11/04/2018), or if symptoms worsen or fail to improve, for Follow up with Wyn Quaker FNP-C, Follow up for PFT.    I discussed the assessment and treatment plan with the patient. The patient was provided an opportunity to ask questions and all were answered. The patient agreed with the plan and demonstrated an understanding of the instructions.   The patient was advised to call back or seek an in-person evaluation if the symptoms worsen or if the condition fails to improve as anticipated.  I provided 22 minutes of non-face-to-face time during this encounter.   Lauraine Rinne, NP

## 2018-08-05 ENCOUNTER — Ambulatory Visit: Payer: Medicaid Other | Admitting: Internal Medicine

## 2018-08-05 ENCOUNTER — Other Ambulatory Visit: Payer: Self-pay

## 2018-08-05 ENCOUNTER — Ambulatory Visit (INDEPENDENT_AMBULATORY_CARE_PROVIDER_SITE_OTHER): Payer: Medicaid Other | Admitting: Pulmonary Disease

## 2018-08-05 ENCOUNTER — Encounter: Payer: Self-pay | Admitting: Pulmonary Disease

## 2018-08-05 DIAGNOSIS — R0602 Shortness of breath: Secondary | ICD-10-CM | POA: Diagnosis not present

## 2018-08-05 DIAGNOSIS — F17200 Nicotine dependence, unspecified, uncomplicated: Secondary | ICD-10-CM

## 2018-08-05 DIAGNOSIS — R918 Other nonspecific abnormal finding of lung field: Secondary | ICD-10-CM | POA: Diagnosis not present

## 2018-08-05 MED ORDER — ALBUTEROL SULFATE HFA 108 (90 BASE) MCG/ACT IN AERS: 2.0000 | INHALATION_SPRAY | RESPIRATORY_TRACT | 1 refills | Status: DC | PRN

## 2018-08-05 MED ORDER — NICOTINE 21 MG/24HR TD PT24: 21.0000 mg | MEDICATED_PATCH | Freq: Every day | TRANSDERMAL | 3 refills | Status: DC

## 2018-08-05 MED ORDER — NICOTINE POLACRILEX 4 MG MT LOZG: 4.0000 mg | LOZENGE | OROMUCOSAL | 3 refills | Status: DC | PRN

## 2018-08-05 NOTE — Assessment & Plan Note (Signed)
Assessment: FVC 1.33 (38% predicted) on 2016 spirometry High-resolution CT in December/2019 showing a few patchy areas of GGO January/2020 ILD labs show a's of 81, ESR 30, RNP antibodies greater than 8 Pending referral to rheumatology patient currently scheduled in May/2020  Plan: Continue with planned rheumatology appointment Scheduling patient for full PFTs to be completed in July Follow-up in July with Wyn Quaker, FNP

## 2018-08-05 NOTE — Assessment & Plan Note (Addendum)
Assessment: Persistent shortness of breath Likely due to patient's body habitus Pending rheumatology referral based off of a few elevated ILD labs 2016 spirometry shows a severely reduced FVC Patient continues to smoke  Plan: Full PFTs to be completed in July/2020 Follow-up with Elisha Headland in July/2020 Follow-up with Dr. Marchelle Gearing in September/2020 Keep scheduled follow-up with rheumatology in May/2020 Actively work on improving daily diet as well as physical activity You need to stop smoking

## 2018-08-05 NOTE — Assessment & Plan Note (Signed)
Assessment: Current every day smoker Smoking 1 pack/day 19-pack-year smoking history  Plan: You need to stop smoking You need to set a quit date Prescription for 21 mg nicotine patch prescribed today Prescription for 4 mg nicotine lozenge prescribed today

## 2018-08-05 NOTE — Patient Instructions (Addendum)
Only use your albuterol as a rescue medication to be used if you can't catch your breath by resting or doing a relaxed purse lip breathing pattern.  - The less you use it, the better it will work when you need it. - Ok to use up to 2 puffs  every 4 hours if you must but call for immediate appointment if use goes up over your usual need - Don't leave home without it !!  (think of it like the spare tire for your car)    We recommend that you stop smoking.  >>>You need to set a quit date >>>If you have friends or family who smoke, let them know you are trying to quit and not to smoke around you or in your living environment  Smoking Cessation Resources:  1 800 QUIT NOW  >>> Patient to call this resource and utilize it to help support her quit smoking >>> Keep up your hard work with stopping smoking  You can also contact the [facility] >>>For smoking cessation classes call [phone]  We do not recommend using e-cigarettes as a form of stopping smoking  You can sign up for smoking cessation support texts and information:  >>>https://smokefree.gov/smokefreetxt  Nicotine patches: >>>Make sure you rotate sites that you do not get skin irritation, Apply 1 patch each morning to a non-hairy skin site  If you are smoking greater than 10 cigarettes/day and weigh over 45 kg start with the nicotine patch of 21 mg a day for 6 weeks, then 14 mg a day for 2 weeks, then finished with 7 mg a day for 2 weeks, then stop   >>>If insomnia occurs you are having trouble sleeping you can take the patch off at night, and place a new one on in the morning >>>If the patch is removed at night and you have morning cravings start short acting nicotine replacement therapy such as gum or lozenges  >>>Avoid acidic beverages such as coffee, carbonated beverages before and during gum/lozenge use.  A soft acidic beverages lower oral pH which cause nicotine to not be absorbed properly  Nicotine  lozenge: Lozenges are commonly uses short acting NRT product  >>>Smokers who smoke within 30 minutes of awakening should use 4 mg dose  Can use up to 1 lozenge every 1-2 hours for 6 weeks >>>Total amount of lozenges that can be used per day as 20 >>>Gradually reduce number of lozenges used per day after 2 weeks of use  Place lozenge in mouth and allowed to dissolve for 30 minutes loss and does not need to be chewed  Lozenges have advantages to be able to be used in people with TMG, poor dentition, dentures     Complete follow up with Rheumatology in May/2020  Continue to work on diet and exercise  >>>avoid sugary drinks and foods   Continue to exercise daily     Return in about 3 months (around 11/04/2018), or if symptoms worsen or fail to improve, for Follow up with Elisha HeadlandBrian Selin Eisler FNP-C, Follow up for PFT.  Also recall / schedule with Dr. Marchelle Gearingamaswamy in September/2020    Coronavirus (COVID-19) Are you at risk?  Are you at risk for the Coronavirus (COVID-19)?  To be considered HIGH RISK for Coronavirus (COVID-19), you have to meet the following criteria:  . Traveled to [place], [place], [place], [place] or [place]; or in the [place] to [place], [place], [place], or [place]; and have fever, cough, and shortness of  breath within the last 2 weeks of travel OR . Been in close contact with a person diagnosed with COVID-19 within the last 2 weeks and have fever, cough, and shortness of breath . IF YOU DO NOT MEET THESE CRITERIA, YOU ARE CONSIDERED LOW RISK FOR COVID-19.  What to do if you are HIGH RISK for COVID-19?  Marland Kitchen If you are having a medical emergency, call 911. . Seek medical care right away. Before you go to a doctor's office, urgent care or emergency department, call ahead and tell them about your recent travel, contact with someone diagnosed with COVID-19, and your symptoms. You should receive instructions from your physician's office regarding next steps of care.   . When you arrive at healthcare provider, tell the healthcare staff immediately you have returned from visiting [place], [place], [place], [place] or [place]; or traveled in the [place] to [place], [place], [place], or [place]; in the last two weeks or you have been in close contact with a person diagnosed with COVID-19 in the last 2 weeks.   . Tell the health care staff about your symptoms: fever, cough and shortness of breath. . After you have been seen by a medical provider, you will be either: o Tested for (COVID-19) and discharged home on quarantine except to seek medical care if symptoms worsen, and asked to  - Stay home and avoid contact with others until you get your results (4-5 days)  - Avoid travel on public transportation if possible (such as bus, train, or airplane) or o Sent to the Emergency Department by EMS for evaluation, COVID-19 testing, and possible admission depending on your condition and test results.  What to do if you are LOW RISK for COVID-19?  Reduce your risk of any infection by using the same precautions used for avoiding the common cold or flu:  Marland Kitchen Wash your hands often with soap and warm water for at least 20 seconds.  If soap and water are not readily available, use an alcohol-based hand sanitizer with at least 60% alcohol.  . If coughing or sneezing, cover your mouth and nose by coughing or sneezing into the elbow areas of your shirt or coat, into a tissue or into your sleeve (not your hands). . Avoid shaking hands with others and consider head nods or verbal greetings only. . Avoid touching your eyes, nose, or mouth with unwashed hands.  . Avoid close contact with people who are sick. . Avoid places or events with large numbers of people in one location, like concerts or sporting events. . Carefully consider travel plans you have or are making. . If you are planning any travel outside or inside the [place], visit the CDC's Travelers' Health webpage for the  latest health notices. . If you have some symptoms but not all symptoms, continue to monitor at home and seek medical attention if your symptoms worsen. . If you are having a medical emergency, call 911.   ADDITIONAL HEALTHCARE OPTIONS FOR PATIENTS  [facility] / e-Visit: [email or web address]         [facility]: [phone]  [facility]: [phone]                   [facility]: [phone]           It is flu season:   >>> Best ways to protect herself from the flu: Receive the yearly flu vaccine, practice good hand hygiene washing with  soap and also using hand sanitizer when available, eat a nutritious meals, get adequate rest, hydrate appropriately   Please contact the office if your symptoms worsen or you have concerns that you are not improving.   Thank you for choosing [facility] for your healthcare, and for allowing [place] to partner with you on your healthcare journey. I am thankful to be able to provide care to you today.   Elisha Headland FNP-C      Health Risks of Smoking Smoking cigarettes is very bad for your health. Tobacco smoke has over 200 known poisons in it. It contains the poisonous gases nitrogen oxide and carbon monoxide. There are over 60 chemicals in tobacco smoke that cause cancer. Smoking is difficult to quit because a chemical in tobacco, called nicotine, causes addiction or dependence. When you smoke and inhale, nicotine is absorbed rapidly into the bloodstream through your lungs. Both inhaled and non-inhaled nicotine may be addictive. What are the risks of cigarette smoke? Cigarette smokers have an increased risk of many serious medical problems, including:  Lung cancer.  Lung disease, such as pneumonia, bronchitis, and emphysema.  Chest pain (angina) and heart attack because the heart is not getting enough oxygen.  Heart disease and  peripheral blood vessel disease.  High blood pressure (hypertension).  Stroke.  Oral cancer, including cancer of the lip, mouth, or voice box.  Bladder cancer.  Pancreatic cancer.  Cervical cancer.  Pregnancy complications, including premature birth.  Stillbirths and smaller newborn babies, birth defects, and genetic damage to sperm.  Early menopause.  Lower estrogen level for women.  Infertility.  Facial wrinkles.  Blindness.  Increased risk of broken bones (fractures).  Senile dementia.  Stomach ulcers and internal bleeding.  Delayed wound healing and increased risk of complications during surgery.  Even smoking lightly shortens your life expectancy by several years. Because of secondhand smoke exposure, children of smokers have an increased risk of the following:  Sudden infant death syndrome (SIDS).  Respiratory infections.  Lung cancer.  Heart disease.  Ear infections. What are the benefits of quitting? There are many health benefits of quitting smoking. Here are some of them:  Within days of quitting smoking, your risk of having a heart attack decreases, your blood flow improves, and your lung capacity improves. Blood pressure, pulse rate, and breathing patterns start returning to normal soon after quitting.  Within months, your lungs may clear up completely.  Quitting for 10 years reduces your risk of developing lung cancer and heart disease to almost that of a nonsmoker.  People who quit may see an improvement in their overall quality of life. How do I quit smoking?     Smoking is an addiction with both physical and psychological effects, and longtime habits can be hard to change. Your health care provider can recommend:  Programs and community resources, which may include group support, education, or talk therapy.  Prescription medicines to help reduce cravings.  Nicotine replacement products, such as patches, gum, and nasal sprays. Use  these products only as directed. Do not replace cigarette smoking with electronic cigarettes, which are commonly called e-cigarettes. The safety of e-cigarettes is not known, and some may contain harmful chemicals.  A combination of two or more of these methods. Where to find more information  American Lung Association: www.lung.org  American Cancer Society: www.cancer.org Summary  Smoking cigarettes is very bad for your health. Cigarette smokers have an increased risk of many serious medical problems, including several cancers, heart  disease, and stroke.  Smoking is an addiction with both physical and psychological effects, and longtime habits can be hard to change.  By stopping right away, you can greatly reduce the risk of medical problems for you and your family.  To help you quit smoking, your health care provider can recommend programs, community resources, prescription medicines, and nicotine replacement products such as patches, gum, and nasal sprays. This information is not intended to replace advice given to you by your health care provider. Make sure you discuss any questions you have with your health care provider. Document Released: 05/15/2004 Document Revised: 07/09/2017 Document Reviewed: 04/11/2016 Elsevier Interactive Patient Education  2019 [employer].    Steps to Quit Smoking  Smoking tobacco can be bad for your health. It can also affect almost every organ in your body. Smoking puts you and people around you at risk for many serious long-lasting (chronic) diseases. Quitting smoking is hard, but it is one of the best things that you can do for your health. It is never too late to quit. What are the benefits of quitting smoking? When you quit smoking, you lower your risk for getting serious diseases and conditions. They can include:  Lung cancer or lung disease.  Heart disease.  Stroke.  Heart attack.  Not being able to have children (infertility).  Weak  bones (osteoporosis) and broken bones (fractures). If you have coughing, wheezing, and shortness of breath, those symptoms may get better when you quit. You may also get sick less often. If you are pregnant, quitting smoking can help to lower your chances of having a baby of low birth weight. What can I do to help me quit smoking? Talk with your doctor about what can help you quit smoking. Some things you can do (strategies) include:  Quitting smoking totally, instead of slowly cutting back how much you smoke over a period of time.  Going to in-person counseling. You are more likely to quit if you go to many counseling sessions.  Using resources and support systems, such as: ? Agricultural engineer with a Veterinary surgeon. ? Phone quitlines. ? Automotive engineer. ? Support groups or group counseling. ? Text messaging programs. ? Mobile phone apps or applications.  Taking medicines. Some of these medicines may have nicotine in them. If you are pregnant or breastfeeding, do not take any medicines to quit smoking unless your doctor says it is okay. Talk with your doctor about counseling or other things that can help you. Talk with your doctor about using more than one strategy at the same time, such as taking medicines while you are also going to in-person counseling. This can help make quitting easier. What things can I do to make it easier to quit? Quitting smoking might feel very hard at first, but there is a lot that you can do to make it easier. Take these steps:  Talk to your family and friends. Ask them to support and encourage you.  Call phone quitlines, reach out to support groups, or work with a Veterinary surgeon.  Ask people who smoke to not smoke around you.  Avoid places that make you want (trigger) to smoke, such as: ? Bars. ? Parties. ? Smoke-break areas at work.  Spend time with people who do not smoke.  Lower the stress in your life. Stress can make you want to smoke. Try these  things to help your stress: ? Getting regular exercise. ? Deep-breathing exercises. ? Yoga. ? Meditating. ? Doing a body scan.  To do this, close your eyes, focus on one area of your body at a time from head to toe, and notice which parts of your body are tense. Try to relax the muscles in those areas.  Download or buy apps on your mobile phone or tablet that can help you stick to your quit plan. There are many free apps, such as QuitGuide from the [employer] developer for Disease Control and Prevention). You can find more support from smokefree.gov and other websites. This information is not intended to replace advice given to you by your health care provider. Make sure you discuss any questions you have with your health care provider. Document Released: 02/01/2009 Document Revised: 12/04/2015 Document Reviewed: 08/22/2014 Elsevier Interactive Patient Education  2019 [employer].

## 2018-08-10 ENCOUNTER — Telehealth: Payer: Self-pay | Admitting: Obstetrics and Gynecology

## 2018-08-10 NOTE — Telephone Encounter (Signed)
Patient requesting to speak with a nurse. She states that a few days her nipples were very sore and she had some spotting. She states she does not know if she is pregnant.

## 2018-08-10 NOTE — Telephone Encounter (Signed)
Spoke with pt. Pt's breasts are sore and had some spotting. No period. Advised can take a pregnancy test and let us know if it's positive. Pt voiced understanding. JSY

## 2018-08-31 NOTE — Progress Notes (Deleted)
Office Visit Note  Patient: Emily [name]             Date of Birth: [birth year]           MRN: [id number]             PCP: Domenick BookbinderHess, Kristin M, PA-C Referring: Kalman Shanamaswamy, Murali, MD Visit Date: 09/06/2018 Occupation: @GUAROCC @  Subjective:  No chief complaint on file.   History of Present Illness: Emily [name] is a 38 y.o. female ***   Activities of Daily Living:  Patient reports morning stiffness for *** {minute/hour:19697}.   Patient {ACTIONS;DENIES/REPORTS:21021675::"Denies"} nocturnal pain.  Difficulty dressing/grooming: {ACTIONS;DENIES/REPORTS:21021675::"Denies"} Difficulty climbing stairs: {ACTIONS;DENIES/REPORTS:21021675::"Denies"} Difficulty getting out of chair: {ACTIONS;DENIES/REPORTS:21021675::"Denies"} Difficulty using hands for taps, buttons, cutlery, and/or writing: {ACTIONS;DENIES/REPORTS:21021675::"Denies"}  No Rheumatology ROS completed.   PMFS History:  Patient Active Problem List   Diagnosis Date Noted  . Shortness of breath 08/05/2018  . Abnormal finding on lung imaging 08/05/2018  . 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  . Vaginal odor 09/30/2017  . BV (bacterial vaginosis) 09/30/2017  . Swollen lymph nodes 01/06/2017  . Vaginal irritation 10/08/2016  . RUQ pain 10/08/2016  . LLQ pain 10/08/2016  . Vaginal discharge 10/08/2016  . Menstrual period late 10/08/2016  . Cyst of right Bartholin's gland 12/17/2015  . Trichomonal vaginitis 10/16/2015  . Recurrent boils 06/18/2015  . Type 2 diabetes mellitus with complication, without long-term current use of insulin (HCC) 04/18/2015  . Need for influenza vaccination 04/18/2015  . Ingrowing toenail 04/18/2015  . Vitamin D deficiency 04/18/2015  . Angular cheilitis 04/18/2015  . Dyslipidemia 04/18/2015  . Diabetes mellitus with complication in adult patient (HCC) 02/20/2015  . Smoker 02/20/2015  . Essential hypertension 02/20/2015  . Pain in limb 02/20/2015  . Myalgia 02/20/2015  . Other specified  hypothyroidism 02/20/2015  . Leg pain, inferior 02/20/2015  . Toenail deformity 02/20/2015  . Elevated liver enzymes 01/07/2012  . Abnormal CT of liver 09/30/2011  . Fatty liver 09/30/2011  . G E REFLUX 11/30/2007  . CHEST PAIN-UNSPECIFIED 11/30/2007  . HEADACHE 10/21/2007  . DYSPNEA 09/15/2007  . UNSPECIFIED TACHYCARDIA 06/01/2007  . COUGH 05/24/2007  . URINARY INCONTINENCE 05/24/2007  . TOBACCO ABUSE 05/20/2007  . ASTHMATIC BRONCHITIS, ACUTE 05/18/2007  . PULMONARY NODULE 05/12/2007  . DEPRESSION 04/19/2007  . ASTHMA 04/19/2007    Past Medical History:  Diagnosis Date  . Abnormal CT of liver   . Anxiety   . Asthma   . Asthma   . Bipolar 1 disorder (HCC)   . Bipolar disorder (HCC)   . Chronic pain   . Depression   . Fatty liver   . GERD (gastroesophageal reflux disease)   . Headache(784.0)   . Hyperlipidemia   . Hypertension   . Pulmonary nodule   . Tachycardia   . Thyroid disease     Family History  Problem Relation Age of Onset  . Asthma Mother   . Diabetes Maternal Grandmother   . Hypertension Maternal Grandmother    Past Surgical History:  Procedure Laterality Date  . DILATION AND CURETTAGE OF UTERUS     1 yr ago   Social History   Social History Narrative  . Not on file   Immunization History  Administered Date(s) Administered  . Influenza Whole 01/13/2008  . Influenza,inj,Quad PF,6+ Mos 04/18/2015     Objective: Vital Signs: There were no vitals taken for this visit.   Physical Exam   Musculoskeletal Exam: ***  CDAI Exam: CDAI Score: Not documented Patient  Global Assessment: Not documented; Provider Global Assessment: Not documented Swollen: Not documented; Tender: Not documented Joint Exam   Not documented   There is currently no information documented on the homunculus. Go to the Rheumatology activity and complete the homunculus joint exam.  Investigation: Findings:  05/05/18: Sed rate 30, ACE 81, dsDNA 1, RF <14, CCP<16, ANCA-,  aldolase 4.5, scl-70-, RNP >8, jo-1-, Ro-, La-, CK 47  Component     Latest Ref Rng & Units 05/05/2018  CK Total     29 - 143 U/L 47  CK, MB     0 - 5.0 ng/mL <0.7  Relative Index     0 - 4.0   SSA (Ro) (ENA) Antibody, IgG     <1.0 NEG AI <1.0 NEG  SSB (La) (ENA) Antibody, IgG     <1.0 NEG AI <1.0 NEG  Myeloperoxidase Abs     AI <1.0  Serine Protease 3     AI <1.0  Anti JO-1     0.0 - 0.9 AI <0.2  ENA RNP Ab     0.0 - 0.9 AI >8.0 (H)  Scleroderma (Scl-70) (ENA) Antibody, IgG     <1.0 NEG AI <1.0 NEG  Aldolase     < OR = 8.1 U/L 4.5  ANCA SCREEN     NEGATIVE NEGATIVE  Cyclic Citrullin Peptide Ab     UNITS <16  RA Latex Turbid.     <14 IU/mL <14  ds DNA Ab     IU/mL 1  Angiotensin-Converting Enzyme     9 - 67 U/L 81 (H)  Sed Rate     0 - 20 mm/hr 30 (H)   Imaging: No results found.  Recent Labs: Lab Results  Component Value Date   WBC 12.3 (H) 06/01/2018   HGB 16.2 (H) 06/01/2018   PLT 313 06/01/2018   NA 136 06/01/2018   K 4.2 06/01/2018   CL 100 06/01/2018   CO2 18 (L) 06/01/2018   GLUCOSE 132 (H) 06/01/2018   BUN 5 (L) 06/01/2018   CREATININE 0.70 06/01/2018   BILITOT 0.6 02/20/2015   ALKPHOS 147 (H) 02/20/2015   AST 35 (H) 02/20/2015   ALT 40 (H) 02/20/2015   PROT 7.2 02/20/2015   ALBUMIN 4.1 02/20/2015   CALCIUM 9.5 06/01/2018   GFRAA 129 06/01/2018    Speciality Comments: No specialty comments available.  Procedures:  No procedures performed Allergies: Bactrim [sulfamethoxazole-trimethoprim]; Ciprofloxacin; Flagyl [metronidazole]; Iodinated diagnostic agents; Doxycycline; Penicillins; and Prednisone   Assessment / Plan:     Visit Diagnoses: No diagnosis found.   Orders: No orders of the defined types were placed in this encounter.  No orders of the defined types were placed in this encounter.   Face-to-face time spent with patient was *** minutes. Greater than 50% of time was spent in counseling and coordination of care.  Follow-Up  Instructions: No follow-ups on file.   Gearldine Bienenstock, PA-C  Note - This record has been created using Dragon software.  Chart creation errors have been sought, but may not always  have been located. Such creation errors do not reflect on  the standard of medical care.

## 2018-09-06 ENCOUNTER — Ambulatory Visit: Payer: Self-pay | Admitting: Rheumatology

## 2018-09-15 ENCOUNTER — Telehealth: Payer: Self-pay | Admitting: Pulmonary Disease

## 2018-09-15 NOTE — Telephone Encounter (Signed)
Spoke with pt. She has been scheduled with Tonya at 2pm. Nothing further was needed.

## 2018-09-15 NOTE — Telephone Encounter (Signed)
Primary Pulmonologist: MR Last office visit and with whom: Aaron Edelman 08/05/2018 What do we see them for (pulmonary problems): asthma Last OV assessment/plan: Abnormal finding on lung imaging Assessment: FVC 1.33 (38% predicted) on 2016 spirometry High-resolution CT in December/2019 showing a few patchy areas of GGO January/2020 ILD labs show a's of 81, ESR 30, RNP antibodies greater than 8 Pending referral to rheumatology patient currently scheduled in May/2020  Plan: Continue with planned rheumatology appointment Scheduling patient for full PFTs to be completed in July Follow-up in July with Wyn Quaker, FNP  Shortness of breath Assessment: Persistent shortness of breath Likely due to patient's body habitus Pending rheumatology referral based off of a few elevated ILD labs 2016 spirometry shows a severely reduced FVC Patient continues to smoke  Plan: Full PFTs to be completed in July/2020 Follow-up with Wyn Quaker in July/2020 Follow-up with Dr. Chase Caller in September/2020 Keep scheduled follow-up with rheumatology in May/2020 Actively work on improving daily diet as well as physical activity You need to stop smoking   TOBACCO ABUSE Assessment: Current every day smoker Smoking 1 pack/day 19-pack-year smoking history  Plan: You need to stop smoking You need to set a quit date Prescription for 21 mg nicotine patch prescribed today Prescription for 4 mg nicotine lozenge prescribed today  Was appointment offered to patient (explain)?  Yes, but she wanted to receive recommendations first. Will schedule an appointment if it is absolutely necessary.    Reason for call: Spoke with patient. She stated that she has developed a cough within the past week. So far it has been a non-productive cough. She does have some SOB but she stated that she has been dealing with this for years. Denies having a fever, chest pain or body aches. Also denies being around anyone with COVID.    Pharmacy is [facility].   SG, please advise. Thanks!

## 2018-09-15 NOTE — Telephone Encounter (Signed)
Please schedule for a video visit with one of the NP's tomorrow

## 2018-09-16 ENCOUNTER — Ambulatory Visit (INDEPENDENT_AMBULATORY_CARE_PROVIDER_SITE_OTHER): Payer: Medicaid Other | Admitting: Nurse Practitioner

## 2018-09-16 ENCOUNTER — Other Ambulatory Visit: Payer: Self-pay

## 2018-09-16 ENCOUNTER — Encounter: Payer: Self-pay | Admitting: Nurse Practitioner

## 2018-09-16 DIAGNOSIS — K219 Gastro-esophageal reflux disease without esophagitis: Secondary | ICD-10-CM

## 2018-09-16 MED ORDER — OMEPRAZOLE 20 MG PO CPDR: 20.0000 mg | DELAYED_RELEASE_CAPSULE | Freq: Every day | ORAL | 11 refills | Status: DC

## 2018-09-16 MED ORDER — PREDNISONE 10 MG PO TABS: ORAL_TABLET | ORAL | 0 refills | Status: DC

## 2018-09-16 NOTE — Patient Instructions (Addendum)
Will order Prilosec for reflux  Continue duoneb as needed for shortness of breath  Cough: Continue gastroesophageal reflux disease treatment with elevating the head your bed and taking antacids Continue taking over-the-counter antihistamines and nasal fluticasone to help with allergic rhinitis You need to try to suppress your cough to allow your larynx (voice box) to heal.  For three days don't talk, laugh, sing, or clear your throat. Do everything you can to suppress the cough during this time. Use hard candies (sugarless Jolly Ranchers) or non-mint or non-menthol containing cough drops during this time to soothe your throat.  Use a cough suppressant (Delsym or what I have prescribed you) around the clock during this time.  After three days, gradually increase the use of your voice and back off on the cough suppressants.  Please quit smoking  Follow up: Follow up as already scheduled

## 2018-09-16 NOTE — Assessment & Plan Note (Signed)
Patient has a tele-visit today for cough.  She states that her cough is chronic.  Recently she has noticed increase reflux.  She denies any significant shortness of breath today or fever.  She states that she has been coughing up clear mucus.  Unfortunately, patient continues to smoke.  She states that her primary care physician prescribed DuoNeb treatments for her at home and this has seemed to help with her shortness of breath and has helped some with the cough also.  We discussed that she can continue DuoNeb treatments as needed.  We will also order medication for acid reflux.   GERD: Patient has noticed recent increase in reflux Plan: Will order prilosec  Cough: Chronic over the past year. Plan: Start gastroesophageal reflux disease treatment with elevating the head your bed and taking antacids May take mucinex Continue taking over-the-counter antihistamines and nasal fluticasone to help with allergic rhinitis You need to try to suppress your cough to allow your larynx (voice box) to heal.  For three days don't talk, laugh, sing, or clear your throat. Do everything you can to suppress the cough during this time. Use hard candies (sugarless Jolly Ranchers) or non-mint or non-menthol containing cough drops during this time to soothe your throat.  Use a cough suppressant (Delsym or what I have prescribed you) around the clock during this time.  After three days, gradually increase the use of your voice and back off on the cough suppressants.   Shortness of breath Assessment: Persistent shortness of breath Likely due to patient's body habitus Pending rheumatology referral based off of a few elevated ILD labs 2016 spirometry shows a severely reduced FVC Patient continues to smoke  Plan: Full PFTs to be completed in July/2020 Follow-up with Elisha Headland in July/2020 Follow-up with Dr. Marchelle Gearing in September/2020 Keep scheduled follow-up with rheumatology in May/2020 Actively work on  improving daily diet as well as physical activity You need to stop smoking   TOBACCO ABUSE Assessment: Current every day smoker Smoking 1 pack/day 19-pack-year smoking history  Plan: You need to stop smoking You need to set a quit date Prescribed at last visit: Prescription for 21 mg nicotine patch prescribed  Prescription for 4 mg nicotine lozenge prescribed

## 2018-09-16 NOTE — Progress Notes (Signed)
Virtual Visit via Telephone Note  I connected with Emily [name] on 09/16/18 at  2:00 PM EDT by telephone and verified that I am speaking with the correct person using two identifiers.  Location: Patient: home Provider: office   I discussed the limitations, risks, security and privacy concerns of performing an evaluation and management service by telephone and the availability of in person appointments. I also discussed with the patient that there may be a patient responsible charge related to this service. The patient expressed understanding and agreed to proceed.   History of Present Illness: 38 year old female smoker followed in our office for abnormal CT scans Past medical history: Type 2 diabetes, GERD, obesity, hyperlipidemia, hypertension Current every day smoker, smoking 1 pack/day, 19-pack-year smoking history Patient of Dr. Chase Caller  Patient has a tele-visit today for cough.  She states that her cough is chronic.  Recently she has noticed increase reflux.  She denies any significant shortness of breath today or fever.  She states that she has been coughing up clear mucus.  Unfortunately, patient continues to smoke.  She states that her primary care physician prescribed DuoNeb treatments for her at home and this has seemed to help with her shortness of breath and has helped some with the cough also.Denies f/c/s, n/v/d, hemoptysis, PND, leg swelling.    Observations/Objective: 05/05/2018  Ace - 39 Esr - 30 Rh<14 CCP<16 ANCA - negative  Hypersensitive activity pneumonitis panel negative Anti-Jo 1 less than 0.2 RNP antibodies greater than 8 Sjogren's syndrome antibodies negative Anti-scleroderma antibody less than 1 Aldolase 4.5 MPO/PR-3 (ANCA) antibodies-less than 1 Total CK 47, CK-MB less than 7 -negative   04/01/2018-CT chest high resolution- few patchy areas of mild groundglass attenuation in the lungs bilaterally nonspecific, could consider repeating CT in 12  months to further assess, stable pulmonary nodules from 2009 consider definitely benign, aortic arthrosclerosis  01/08/2015- spirometry- FVC 1.33 (38% predicted), ratio 72, FEV1 1.01 (37% predicted)  Assessment and Plan: Patient has a tele-visit today for cough.  She states that her cough is chronic.  Recently she has noticed increase reflux.  She denies any significant shortness of breath today or fever.  She states that she has been coughing up clear mucus.  Unfortunately, patient continues to smoke.  She states that her primary care physician prescribed DuoNeb treatments for her at home and this has seemed to help with her shortness of breath and has helped some with the cough also.  We discussed that she can continue DuoNeb treatments as needed.  We will also order medication for acid reflux.   GERD: Patient has noticed recent increase in reflux Plan: Will order prilosec  Cough: Chronic over the past year. Plan: Start gastroesophageal reflux disease treatment with elevating the head your bed and taking antacids May take mucinex Continue taking over-the-counter antihistamines and nasal fluticasone to help with allergic rhinitis You need to try to suppress your cough to allow your larynx (voice box) to heal.  For three days don't talk, laugh, sing, or clear your throat. Do everything you can to suppress the cough during this time. Use hard candies (sugarless Jolly Ranchers) or non-mint or non-menthol containing cough drops during this time to soothe your throat.  Use a cough suppressant (Delsym or what I have prescribed you) around the clock during this time.  After three days, gradually increase the use of your voice and back off on the cough suppressants.   Shortness of breath Assessment: Persistent shortness of breath Likely due  to patient's body habitus Pending rheumatology referral based off of a few elevated ILD labs 2016 spirometry shows a severely reduced FVC Patient continues  to smoke  Plan: Full PFTs to be completed in July/2020 Follow-up with Wyn Quaker in July/2020 Follow-up with Dr. Chase Caller in September/2020 Keep scheduled follow-up with rheumatology in May/2020 Actively work on improving daily diet as well as physical activity You need to stop smoking   TOBACCO ABUSE Assessment: Current every day smoker Smoking 1 pack/day 19-pack-year smoking history  Plan: You need to stop smoking You need to set a quit date Prescribed at last visit: Prescription for 21 mg nicotine patch prescribed  Prescription for 4 mg nicotine lozenge prescribed    Follow Up Instructions:  Follow up as already scheduled   I discussed the assessment and treatment plan with the patient. The patient was provided an opportunity to ask questions and all were answered. The patient agreed with the plan and demonstrated an understanding of the instructions.   The patient was advised to call back or seek an in-person evaluation if the symptoms worsen or if the condition fails to improve as anticipated.  I provided 22 minutes of non-face-to-face time during this encounter.   Fenton Foy, NP

## 2018-09-20 ENCOUNTER — Encounter: Payer: Self-pay | Admitting: Adult Health

## 2018-09-20 ENCOUNTER — Ambulatory Visit (INDEPENDENT_AMBULATORY_CARE_PROVIDER_SITE_OTHER): Payer: Medicaid Other | Admitting: Adult Health

## 2018-09-20 ENCOUNTER — Other Ambulatory Visit: Payer: Self-pay

## 2018-09-20 DIAGNOSIS — N644 Mastodynia: Secondary | ICD-10-CM

## 2018-09-20 DIAGNOSIS — N898 Other specified noninflammatory disorders of vagina: Secondary | ICD-10-CM

## 2018-09-20 NOTE — Progress Notes (Addendum)
Patient ID: Emily [name], female   DOB: Nov 27, 1980, 38 y.o.   MRN: [id number]   TELEHEALTH VIRTUAL GYNECOLOGY VISIT ENCOUNTER NOTE  I connected with Regene L Mooty on 09/20/18 at  2:15 PM EDT by telephone at home and verified that I am speaking with the correct person using two identifiers.   I discussed the limitations, risks, security and privacy concerns of performing an evaluation and management service by telephone and the availability of in person appointments. I also discussed with the patient that there may be a patient responsible charge related to this service. The patient expressed understanding and agreed to proceed.   History:  Emily [name] is a 38 y.o. G1P0010,white female,single, being evaluated today for clear mucous vaginal discharge and has tenderness right breast and nipple.She says she had 2 week period in April and none in May.. She denies any  bleeding, pelvic pain or other concerns.    PCP is Hinda Glatter PA.    Past Medical History:  Diagnosis Date  . Abnormal CT of liver   . Anxiety   . Asthma   . Asthma   . Bipolar 1 disorder (HCC)   . Bipolar disorder (HCC)   . Chronic pain   . Depression   . Fatty liver   . GERD (gastroesophageal reflux disease)   . Headache(784.0)   . Hyperlipidemia   . Hypertension   . Pulmonary nodule   . Tachycardia   . Thyroid disease    Past Surgical History:  Procedure Laterality Date  . DILATION AND CURETTAGE OF UTERUS     1 yr ago   The following portions of the patient's history were reviewed and updated as appropriate: allergies, current medications, past family history, past medical history, past social history, past surgical history and problem list.   Health Maintenance:  Normal pap and negative HRHPV on 01/01/16.   Review of Systems:  Pertinent items noted in HPI and remainder of comprehensive ROS otherwise negative.  Physical Exam:   General:  Alert, oriented and cooperative.   Mental Status: Normal  mood and affect perceived. Normal judgment and thought content.  Physical exam deferred due to nature of the encounter LMP April, none for May.  PHQ 2 score 0  Labs and Imaging No results found for this or any previous visit (from the past 336 hour(s)). No results found.    Assessment and Plan:     1. Vaginal discharge -this sounds like could be ovulation mucous, will probably start a period in about 2 weeks   2. Breast tenderness -decrease caffeine Follow up prn        I discussed the assessment and treatment plan with the patient. The patient was provided an opportunity to ask questions and all were answered. The patient agreed with the plan and demonstrated an understanding of the instructions.   The patient was advised to call back or seek an in-person evaluation/go to the ED if the symptoms worsen or if the condition fails to improve as anticipated.  I provided 5 minutes of non-face-to-face time during this encounter.   Cyril Mourning, NP [facility] [employer], [facility]

## 2018-10-05 ENCOUNTER — Ambulatory Visit: Payer: Self-pay | Admitting: Rheumatology

## 2018-10-08 ENCOUNTER — Telehealth: Payer: Self-pay | Admitting: Internal Medicine

## 2018-10-08 NOTE — Telephone Encounter (Signed)
Called & spoke w/ pt regarding these recommendations. Pt verbalized understanding with no additional questions.  Nothing further needed at this time.  

## 2018-10-08 NOTE — Telephone Encounter (Signed)
Primary Pulmonologist: MR Last office visit and with whom: 09/16/2018 w/ [place]  What do we see them for (pulmonary problems): SOB, GERD, Tobacco use disorder  Reason for call: Pt called stating she has an uncomfortable sensation in her lungs and nonproductive cough w/ clear mucus. States she just got off a prednisone taper and notes that when she was on prednisone, her lungs felt fine. Reports using nebulizer treatment three days ago, but is not currently taking any inhaler. Denies fever/chills/muscle aches. Denies wheezing, chest tightness, or pain. Pt is inquiring if she could be prescribed additional prednisone.  In the last month, have you been in contact with someone who was confirmed or suspected to have Conoravirus / COVID-19?  No  Do you have any of the following symptoms developed in the last 30 days? Fever: No Cough: Yes, nonproductive w/ clear mucus Shortness of breath: No  When did your symptoms start?  10/08/2018  If the patient has a fever, what is the last reading?  (use n/a if patient denies fever)  N/A . IF THE PATIENT STATES THEY DO NOT OWN A THERMOMETER, THEY MUST GO AND PURCHASE ONE When did the fever start?: N/A Have you taken any medication to suppress a fever (ie Ibuprofen, Aleve, Tylenol)?: N/A

## 2018-10-08 NOTE — Telephone Encounter (Signed)
Sorry to hear this .  May try Mucinex DM Twice daily As needed  Cough/congestion.  If not improving will need further evaluation with CXR , may need COVID 19 testing as well    Please contact office for sooner follow up if symptoms do not improve or worsen or seek emergency care

## 2018-10-25 ENCOUNTER — Other Ambulatory Visit: Payer: Self-pay

## 2018-10-25 MED ORDER — PROPRANOLOL HCL 20 MG PO TABS: 20.0000 mg | ORAL_TABLET | Freq: Two times a day (BID) | ORAL | 3 refills | Status: DC

## 2018-11-04 ENCOUNTER — Ambulatory Visit: Payer: Medicaid Other | Admitting: Pulmonary Disease

## 2018-11-12 ENCOUNTER — Telehealth: Payer: Self-pay | Admitting: *Deleted

## 2018-11-12 NOTE — Telephone Encounter (Signed)
I placed call to pt to schedule appointment with APP(see my chart message).  Left message to call office.

## 2018-11-15 NOTE — Telephone Encounter (Signed)
Message  Sorry I missed your call can you call me back I called back it's a long wait thanks   Per MyChart message.

## 2018-11-15 NOTE — Telephone Encounter (Signed)
Spoke with pt and made an appt with Robbie Lis PA for 12-01-18 at 1:15 pm Pt declined sooner appt due to virus Pt will call back if symptoms worsen prior to this appt ./cy

## 2018-11-15 NOTE — Telephone Encounter (Signed)
Lm to call back ./cy 

## 2018-11-30 NOTE — Progress Notes (Deleted)
Cardiology Office Note    Date:  11/30/2018   ID:  Emily [name], DOB 1981-03-31, MRN [id number]  PCP:  Harlene Salts, PA-C  Cardiologist: Dr. Acie Fredrickson   Chief Complaint: 6 Months follow up  History of Present Illness:   Emily [name] is a 38 y.o. female with hx of HTN, HLD, hypothyroidism, bipolar disorder and coronary artery calcification seen for follow up.   Established care with Dr. Acie Fredrickson for coronary calcification on CT of chest. Also noted symptoms concerning of unable angina. Recommended cath as well as Coronary CT but never followed up for either study.    Past Medical History:  Diagnosis Date  . Abnormal CT of liver   . Anxiety   . Asthma   . Asthma   . Bipolar 1 disorder ([place])   . Bipolar disorder ([place])   . Chronic pain   . Depression   . Fatty liver   . GERD (gastroesophageal reflux disease)   . Headache(784.0)   . Hyperlipidemia   . Hypertension   . Pulmonary nodule   . Tachycardia   . Thyroid disease     Past Surgical History:  Procedure Laterality Date  . DILATION AND CURETTAGE OF UTERUS     1 yr ago    Current Medications: Prior to Admission medications   Medication Sig Start Date End Date Taking? Authorizing Provider  ACCU-CHEK FASTCLIX LANCETS MISC USE AS DIRECTED 12/10/17   Tysinger, [name], PA-C  albuterol (PROVENTIL HFA;VENTOLIN HFA) 108 (90 Base) MCG/ACT inhaler Inhale 2 puffs into the lungs every 4 (four) hours as needed. 08/05/18   Lauraine Rinne, NP  amLODipine (NORVASC) 10 MG tablet TAKE 1 TABLET (10 MG TOTAL) BY MOUTH DAILY. 11/27/15   Tysinger, [name], PA-C  aspirin EC 81 MG tablet Take 1 tablet (81 mg total) by mouth daily. 06/01/18   Nahser, Wonda Cheng, MD  Blood Glucose Monitoring Suppl (ACCU-CHEK AVIVA PLUS) w/Device KIT USE AS DIRECTED 05/01/16   Tysinger, [name], PA-C  clindamycin (CLEOCIN) 150 MG capsule Take 1 capsule (150 mg total) by mouth 3 (three) times daily. Patient not taking: Reported on 09/20/2018 07/13/18   Jonnie Kind, MD  lamoTRIgine (LAMICTAL) 150 MG tablet Take 150 mg by mouth daily.      [provider]  levothyroxine (SYNTHROID, LEVOTHROID) 75 MCG tablet Take 1 tablet (75 mcg total) by mouth daily before breakfast. 02/22/15   Tysinger, [name], PA-C  montelukast (SINGULAIR) 10 MG tablet Take 1 tablet (10 mg total) by mouth at bedtime. 02/22/15   Tysinger, [name], PA-C  nicotine (NICODERM CQ) 21 mg/24hr patch Place 1 patch (21 mg total) onto the skin daily. Patient not taking: Reported on 09/20/2018 08/05/18   Lauraine Rinne, NP  nicotine polacrilex (COMMIT) 4 MG lozenge Take 1 lozenge (4 mg total) by mouth as needed for smoking cessation. Patient not taking: Reported on 09/20/2018 08/05/18   Lauraine Rinne, NP  nitroGLYCERIN (NITROSTAT) 0.4 MG SL tablet Place 1 tablet (0.4 mg total) under the tongue every 5 (five) minutes as needed for chest pain. 06/01/18   Nahser, Wonda Cheng, MD  omeprazole (PRILOSEC) 20 MG capsule Take 1 capsule (20 mg total) by mouth daily. 09/16/18   Fenton Foy, NP  paroxetine mesylate (PEXEVA) 20 MG tablet Take 20 mg by mouth daily.      [provider]  predniSONE (DELTASONE) 10 MG tablet Take 4 tabs for 2 days, then 3 tabs for  2 days, then 2 tabs for 2 days, then 1 tab for 2 days, then stop 09/16/18   Fenton Foy, NP  propranolol (INDERAL) 20 MG tablet Take 1 tablet (20 mg total) by mouth 2 (two) times daily. 10/25/18   Nahser, Wonda Cheng, MD  ziprasidone (GEODON) 40 MG capsule Take 60 mg by mouth daily.     [provider]    Allergies:   Bactrim [sulfamethoxazole-trimethoprim], Ciprofloxacin, Flagyl [metronidazole], Iodinated diagnostic agents, Doxycycline, Penicillins, and Prednisone   Social History   Socioeconomic History  . Marital status: Single    Spouse name: Not on file  . Number of children: Not on file  . Years of education: Not on file  . Highest education level: Not on file  Occupational History  . Not on file  Social Needs  .  Financial resource strain: Not on file  . Food insecurity    Worry: Not on file    Inability: Not on file  . Transportation needs    Medical: Not on file    Non-medical: Not on file  Tobacco Use  . Smoking status: Current Every Day Smoker    Packs/day: 1.00    Years: 19.00    Pack years: 19.00    Types: Cigarettes  . Smokeless tobacco: Never Used  Substance and Sexual Activity  . Alcohol use: No  . Drug use: No  . Sexual activity: Not Currently    Partners: Male    Birth control/protection: Condom  Lifestyle  . Physical activity    Days per week: Not on file    Minutes per session: Not on file  . Stress: Not on file  Relationships  . Social Herbalist on phone: Not on file    Gets together: Not on file    Attends religious service: Not on file    Active member of club or organization: Not on file    Attends meetings of clubs or organizations: Not on file    Relationship status: Not on file  Other Topics Concern  . Not on file  Social History Narrative  . Not on file     Family History:  The patient's family history includes Asthma in her mother; Diabetes in her maternal grandmother; Hypertension in her maternal grandmother. ***  ROS:   Please see the history of present illness.    ROS All other systems reviewed and are negative.   PHYSICAL EXAM:   VS:  There were no vitals taken for this visit.   GEN: Well nourished, well developed, in no acute distress  HEENT: normal  Neck: no JVD, carotid bruits, or masses Cardiac: ***RRR; no murmurs, rubs, or gallops,no edema  Respiratory:  clear to auscultation bilaterally, normal work of breathing GI: soft, nontender, nondistended, + BS MS: no deformity or atrophy  Skin: warm and dry, no rash Neuro:  Alert and Oriented x 3, Strength and sensation are intact Psych: euthymic mood, full affect  Wt Readings from Last 3 Encounters:  06/01/18 215 lb (97.5 kg)  05/05/18 216 lb 3.2 oz (98.1 kg)  03/24/18 214 lb  (97.1 kg)      Studies/Labs Reviewed:   EKG:  EKG is ordered today.  The ekg ordered today demonstrates ***  Recent Labs: 06/01/2018: BUN 5; Creatinine, Ser 0.70; Hemoglobin 16.2; Platelets 313; Potassium 4.2; Sodium 136   Lipid Panel    Component Value Date/Time   CHOL 251 (H) 02/20/2015 0001   TRIG 516 (H) 02/20/2015 0001  HDL 29 (L) 02/20/2015 0001   CHOLHDL 8.7 (H) 02/20/2015 0001   VLDL NOT CALC 02/20/2015 0001   LDLCALC NOT CALC 02/20/2015 0001    Additional studies/ records that were reviewed today include:   As summarized above   ASSESSMENT & PLAN:    1. ***    Medication Adjustments/Labs and Tests Ordered: Current medicines are reviewed at length with the patient today.  Concerns regarding medicines are outlined above.  Medication changes, Labs and Tests ordered today are listed in the Patient Instructions below. There are no Patient Instructions on file for this visit.   Jarrett Soho, [place]  11/30/2018 4:18 PM    [facility] [address], [place], [place]  [address] Phone: [phone]; Fax: [phone]

## 2018-12-01 ENCOUNTER — Ambulatory Visit: Payer: Medicaid Other | Admitting: Physician Assistant

## 2018-12-09 ENCOUNTER — Other Ambulatory Visit: Payer: Self-pay | Admitting: Nurse Practitioner

## 2018-12-09 MED ORDER — AMLODIPINE BESYLATE 10 MG PO TABS: 10.0000 mg | ORAL_TABLET | Freq: Every day | ORAL | 0 refills | Status: DC

## 2018-12-10 ENCOUNTER — Telehealth: Payer: Self-pay | Admitting: *Deleted

## 2018-12-10 NOTE — Telephone Encounter (Signed)
-----   Message from Emmaline Life, RN sent at 12/09/2018  5:20 PM EDT ----- Marykay Lex,   Need your help with what to do with this pt. Nahser saw her once and she no-showed for her cath, we rescheduled it and she cancelled it. She sends lots of advice requests and I've answered them but she is getting to the point of badgering me. He does not want to be her MD bc she is not going to comply with anything we tell her. Can you look at her chart and let me know your thoughts?  Thanks! Sharyn Lull

## 2018-12-10 NOTE — Telephone Encounter (Signed)
Called to speak with patient about below message. I had to leave a voice mail that she would need to get her thyroid medication filled by her PCP and the disk of her CT will need to be obtained by the MD who ordered this originally.  I left my number for her to call back with any questions.

## 2019-01-10 MED ORDER — PROPRANOLOL HCL 20 MG PO TABS: 20.0000 mg | ORAL_TABLET | Freq: Three times a day (TID) | ORAL | 3 refills | Status: DC

## 2019-01-10 NOTE — Telephone Encounter (Signed)
Spoke with Dr. Acie Fredrickson regarding this patient's RX for propranolol. Per Dr. Acie Fredrickson can increase propranolol 20 mg tablet to TID and call in for 1 year supply. Patient will need an appointment for any future refills.

## 2019-01-13 ENCOUNTER — Other Ambulatory Visit: Payer: Self-pay | Admitting: Obstetrics and Gynecology

## 2019-01-14 ENCOUNTER — Other Ambulatory Visit: Payer: Self-pay | Admitting: Obstetrics and Gynecology

## 2019-01-14 MED ORDER — CLINDAMYCIN HCL 150 MG PO CAPS: 150.0000 mg | ORAL_CAPSULE | Freq: Three times a day (TID) | ORAL | 1 refills | Status: DC

## 2019-01-14 NOTE — Progress Notes (Signed)
refil clindamycin per pt report of "another cyst"  Pt infomed that exam is indicated moving forward.

## 2019-01-24 MED ORDER — MONTELUKAST SODIUM 10 MG PO TABS: 10.0000 mg | ORAL_TABLET | Freq: Every day | ORAL | 1 refills | Status: DC

## 2019-01-24 NOTE — Telephone Encounter (Signed)
MR please advise. Thanks! 

## 2019-01-24 NOTE — Telephone Encounter (Signed)
Ok to refill singulair. ?

## 2019-02-03 MED ORDER — PROPRANOLOL HCL 20 MG PO TABS: 20.0000 mg | ORAL_TABLET | Freq: Three times a day (TID) | ORAL | 0 refills | Status: DC

## 2019-02-03 MED ORDER — AMLODIPINE BESYLATE 10 MG PO TABS: 10.0000 mg | ORAL_TABLET | Freq: Every day | ORAL | 1 refills | Status: DC

## 2019-02-14 ENCOUNTER — Encounter: Payer: Self-pay | Admitting: Adult Health

## 2019-02-14 ENCOUNTER — Ambulatory Visit (INDEPENDENT_AMBULATORY_CARE_PROVIDER_SITE_OTHER): Payer: Medicaid Other | Admitting: Adult Health

## 2019-02-14 ENCOUNTER — Other Ambulatory Visit: Payer: Self-pay

## 2019-02-14 DIAGNOSIS — R0602 Shortness of breath: Secondary | ICD-10-CM | POA: Diagnosis not present

## 2019-02-14 MED ORDER — AZITHROMYCIN 250 MG PO TABS: ORAL_TABLET | ORAL | 0 refills | Status: AC

## 2019-02-14 MED ORDER — BENZONATATE 200 MG PO CAPS: 200.0000 mg | ORAL_CAPSULE | Freq: Three times a day (TID) | ORAL | 0 refills | Status: DC | PRN

## 2019-02-14 MED ORDER — ALBUTEROL SULFATE HFA 108 (90 BASE) MCG/ACT IN AERS: 2.0000 | INHALATION_SPRAY | RESPIRATORY_TRACT | 3 refills | Status: DC | PRN

## 2019-02-14 NOTE — Telephone Encounter (Signed)
Attempted to call patient to schedule telephone visit, was unable to leave message. Sent patient a Mychart message to provider another number or to call our office.

## 2019-02-14 NOTE — Patient Instructions (Addendum)
Zpack take as directed.  Mucinex DM Twice daily  As needed  Cough/congestion  Fluids and rest  Work on not smoking .  Albuterol inhaler 2 puffs every 4hr as needed wheezing  Tessalon Three times a day  As needed  Cough  Follow up with Dr. Chase Caller in 3 months and As needed   Please contact office for sooner follow up if symptoms do not improve or worsen or seek emergency care

## 2019-02-14 NOTE — Telephone Encounter (Signed)
Called and spoke to patient. Scheduled patient for a telephone visit. Nothing further needed at this time.

## 2019-02-14 NOTE — Progress Notes (Signed)
Virtual Visit via Telephone Note  I connected with Linn on 02/14/19 at  3:30 PM EDT by telephone and verified that I am speaking with the correct person using two identifiers.  Location: Patient: Home  Provider: Office    I discussed the limitations, risks, security and privacy concerns of performing an evaluation and management service by telephone and the availability of in person appointments. I also discussed with the patient that there may be a patient responsible charge related to this service. The patient expressed understanding and agreed to proceed.   History of Present Illness: Today's televisit is an acute office visit for cough 38 yo female active smoker (1.5PPD) .  Patient complains of 2-3  weeks of cough that has become productive now with green mucus.  She has sinus drainage.  She has been using some over-the-counter cold medicines including Robitussin without much relief.  She has no fever, chest pain, hemoptysis, orthopnea, calf pain, edema.  She lives with her family and no other family members are sick.  She denies any loss of taste or smell.  She denies any body aches or rash.  And no nausea vomiting diarrhea.  Appetite is good.  Lives at home , is disabled from mental issues/panic attacks/depression, bipolar.     Observations/Objective: 05/05/2018  Ace - 82 Esr - 30 Rh<14 CCP<16 ANCA - negative Hypersensitive activity pneumonitis panel negative Anti-Jo 1 less than 0.2 RNP antibodies greater than 8 Sjogren's syndrome antibodies negative Anti-scleroderma antibody less than 1 Aldolase 4.5 MPO/PR-3 (ANCA) antibodies-less than 1 Total CK 47, CK-MB less than 7-negative   04/01/2018-CT chest high resolution- few patchy areas of mild groundglass attenuation in the lungs bilaterally nonspecific, could consider repeating CT in 12 months to further assess, stable pulmonary nodules from 2009 consider definitely benign, aortic arthrosclerosis  01/08/2015-  spirometry- FVC 1.33 (38% predicted), ratio 72, FEV1 1.01 (37% predicted)  Past Medical History:  Diagnosis Date  . Abnormal CT of liver   . Anxiety   . Asthma   . Asthma   . Bipolar 1 disorder ([place])   . Bipolar disorder ([place])   . Chronic pain   . Depression   . Fatty liver   . GERD (gastroesophageal reflux disease)   . Headache(784.0)   . Hyperlipidemia   . Hypertension   . Pulmonary nodule   . Tachycardia   . Thyroid disease      Assessment and Plan: Acute bronchitis.-We will treat with an antibiotic. Tobacco abuse-smoking cessation discussed.  Long discussion with patient as she has multiple drug allergies.  She says she has taken a Z-Pak in the past with improvement in symptoms.  Patient education on use of this antibiotic with her psychiatric medications.  She denies any known history of prolonged QT syndrome.   Plan  Patient Instructions  Zpack take as directed.  Mucinex DM Twice daily  As needed  Cough/congestion  Fluids and rest  Work on not smoking .  Albuterol inhaler 2 puffs every 4hr as needed wheezing  Tessalon Three times a day  As needed  Cough  Follow up with Dr. Chase Caller in 3 months and As needed   Please contact office for sooner follow up if symptoms do not improve or worsen or seek emergency care       Follow Up Instructions: Follow-up with Dr. Chase Caller in 3 months and as needed  Please contact office for sooner follow up if symptoms do not improve or worsen or seek emergency care  I discussed the assessment and treatment plan with the patient. The patient was provided an opportunity to ask questions and all were answered. The patient agreed with the plan and demonstrated an understanding of the instructions.   The patient was advised to call back or seek an in-person evaluation if the symptoms worsen or if the condition fails to improve as anticipated.  I provided 22  minutes of non-face-to-face time during this encounter.   Rexene Edison, NP

## 2019-02-14 NOTE — Telephone Encounter (Signed)
Received mychart message from patient, "I was wondering if you could send me for a CT scan my chest hurts from coughing I been wheezing got snot and mucus I just want to make sure my lungs are ok thank you"  Responded to message asking for more details and other possible symptoms.

## 2019-02-22 ENCOUNTER — Telehealth: Payer: Self-pay

## 2019-02-22 NOTE — Telephone Encounter (Signed)
I spoke with the patient and scheduled an appointment with Dr Acie Fredrickson on 11/4 to further evaluate cause for swollen legs. She verbalized understanding.

## 2019-02-22 NOTE — Telephone Encounter (Signed)
I spoke to the patient who is calling because she is having trouble with her legs.  As of yesterday 11/2, both legs are swollen below her knees and into her toes.    On her right calf, she has noticed  "4 dimple/knots" non-painful.  She does not have a PCP, but I did give her the [place] [facility] [place] #.  Please advise, thank you.

## 2019-02-22 NOTE — Telephone Encounter (Signed)
Is she eating salty foods ? Is she on any new medications  Has she taken any VS to share with [place] ? Does the leg swelling resolve by morning ? She has not seen [place] in a while so it is difficult to say what is the cause of this leg swelling .  She will need to make an appt to come in and be evaluated.  She needs to get established with a primary MD

## 2019-02-23 ENCOUNTER — Other Ambulatory Visit: Payer: Self-pay

## 2019-02-23 ENCOUNTER — Ambulatory Visit: Payer: Medicaid Other | Admitting: Cardiovascular Disease

## 2019-02-23 ENCOUNTER — Encounter: Payer: Self-pay | Admitting: Cardiovascular Disease

## 2019-02-23 VITALS — BP 118/86 | HR 87 | Ht 63.0 in | Wt 215.2 lb

## 2019-02-23 DIAGNOSIS — R600 Localized edema: Secondary | ICD-10-CM | POA: Diagnosis not present

## 2019-02-23 DIAGNOSIS — E785 Hyperlipidemia, unspecified: Secondary | ICD-10-CM | POA: Diagnosis not present

## 2019-02-23 DIAGNOSIS — I10 Essential (primary) hypertension: Secondary | ICD-10-CM | POA: Diagnosis not present

## 2019-02-23 DIAGNOSIS — R079 Chest pain, unspecified: Secondary | ICD-10-CM

## 2019-02-23 NOTE — Patient Instructions (Addendum)
Medication Instructions:  Your physician recommends that you continue on your current medications as directed. Please refer to the Current Medication list given to you today.  *If you need a refill on your cardiac medications before your next appointment, please call your pharmacy*  Lab Work: TODAY - cholesterol, liver panel, basic metabolic panel If you have labs (blood work) drawn today and your tests are completely normal, you will receive your results only by: Marland Kitchen MyChart Message (if you have MyChart) OR . A paper copy in the mail If you have any lab test that is abnormal or we need to change your treatment, we will call you to review the results.   Testing/Procedures: Your physician has requested that you have an echocardiogram at [facility]. Echocardiography is a painless test that uses sound waves to create images of your heart. It provides your doctor with information about the size and shape of your heart and how well your heart's chambers and valves are working. This procedure takes approximately one hour. There are no restrictions for this procedure.    Follow-Up: At [facility], you and your health needs are our priority.  As part of our continuing mission to provide you with exceptional heart care, we have created designated Provider Care Teams.  These Care Teams include your primary Cardiologist (physician) and Advanced Practice Providers (APPs -  Physician Assistants and Nurse Practitioners) who all work together to provide you with the care you need, when you need it.  Your next appointment:   6 months  The format for your next appointment:   Either In Person or Virtual  Provider:   You may see Mertie Moores, MD or one of the following Advanced Practice Providers on your designated Care Team:    Richardson Dopp, PA-C  [place], [place]  Daune Perch, [place]

## 2019-02-23 NOTE — Progress Notes (Signed)
Cardiology Office Note:    Date:  02/23/2019   ID:  Emily [name], DOB July 01, 1980, MRN [id number]  PCP:  Harlene Salts, PA-C  Cardiologist:  Mertie Moores, MD  Electrophysiologist:  None   Problem list 1.  Coronary artery calcifications 2.  Hyperlipidemia 3.  Hypertension 4.  Hypothyroidism 5.  Diabetes mellitus  Referring MD: Harlene Salts, PA-C   Chief Complaint  Patient presents with  . Leg Swelling  . Coronary Artery Disease     Feb. 11, 2020    Emily [name] is a 38 y.o. female with a hx of hypertension and hyperlipidemia.   Was seen with her sister Emily [name].   She recently had a CT scan of the lungs and was incidentally found to have coronary artery calcifications. We are asked to see her today by Harlene Salts, PA-C for further evaluations of these coronary artery calcifications   Emily [name] had a CT scan for further evaluation of some shortness of breath.  The shortness of breath seems to be worsening over the past several months.  She was incidentally found to have coronary artery calcifications.  Has occasional chest tightness/ pressure like sensation  with exertion.     She has these episodes of tightness whenever she does anything exertional such as climbing stairs or walking from the parking lot into a store. The pain is described as a pressure-like pain or a tightness.  It last for as long as she is walking and then resolves after several minutes after she stops walking.  She is never had any prolonged episodes.  Does not do any regular exercise   Eats lots of junk food.    Does not pay attention to her diet Goes to [employer] lots.    Eats late at night  is not working  - trying to get disability .    + family hx of CAD  - mother and father both had cad   March 05, 2019:  Emily [name]  seen today for follow-up visit.  I saw her earlier this year.  She had a history of coronary artery calcifications and had symptoms that were worrisome for  unstable angina.  She has a strong family history of coronary disease and has multiple risk factors including hyperlipidemia and cigarette smoking.  I saw her in Jan.   For angina . Has lots of CP with anxiety. No exercise per se.  Complains of lung issues.   Still smoking .  She is not having any further episodes of angina.  She admits that she is very stressed out.  Complains of 4 dimples in her leg.  She complains of leg swelling.  She admits that she eats salty foods.  She likes soup.  Still eats some salty foods.   Sees pulmonary    Past Medical History:  Diagnosis Date  . Abnormal CT of liver   . Anxiety   . Asthma   . Asthma   . Bipolar 1 disorder ([place])   . Bipolar disorder ([place])   . Chronic pain   . Depression   . Fatty liver   . GERD (gastroesophageal reflux disease)   . Headache(784.0)   . Hyperlipidemia   . Hypertension   . Pulmonary nodule   . Tachycardia   . Thyroid disease     Past Surgical History:  Procedure Laterality Date  . DILATION AND CURETTAGE OF UTERUS     1 yr ago    Current Medications: Current Meds  Medication Sig  . ACCU-CHEK FASTCLIX LANCETS MISC USE AS DIRECTED  . albuterol (VENTOLIN HFA) 108 (90 Base) MCG/ACT inhaler Inhale 2 puffs into the lungs every 4 (four) hours as needed.  Marland Kitchen amLODipine (NORVASC) 10 MG tablet Take 1 tablet (10 mg total) by mouth daily.  Marland Kitchen aspirin EC 81 MG tablet Take 1 tablet (81 mg total) by mouth daily.  . benzonatate (TESSALON) 200 MG capsule Take 1 capsule (200 mg total) by mouth 3 (three) times daily as needed for cough.  . Blood Glucose Monitoring Suppl (ACCU-CHEK AVIVA PLUS) w/Device KIT USE AS DIRECTED  . lamoTRIgine (LAMICTAL) 150 MG tablet Take 150 mg by mouth daily.    Marland Kitchen levothyroxine (SYNTHROID, LEVOTHROID) 75 MCG tablet Take 1 tablet (75 mcg total) by mouth daily before breakfast.  . montelukast (SINGULAIR) 10 MG tablet Take 1 tablet (10 mg total) by mouth at bedtime.  . nitroGLYCERIN (NITROSTAT) 0.4  MG SL tablet Place 1 tablet (0.4 mg total) under the tongue every 5 (five) minutes as needed for chest pain.  Marland Kitchen omeprazole (PRILOSEC) 20 MG capsule Take 1 capsule (20 mg total) by mouth daily.  . paroxetine mesylate (PEXEVA) 20 MG tablet Take 20 mg by mouth daily.    . propranolol (INDERAL) 20 MG tablet Take 1 tablet (20 mg total) by mouth 3 (three) times daily.  . ziprasidone (GEODON) 40 MG capsule Take 60 mg by mouth daily.    Current Facility-Administered Medications for the 02/23/19 encounter (Office Visit) with Nahser, Wonda Cheng, MD  Medication  . clindamycin (CLEOCIN) 2 % vaginal cream 1 Applicatorful     Allergies:   Bactrim [sulfamethoxazole-trimethoprim], Ciprofloxacin, Flagyl [metronidazole], Iodinated diagnostic agents, Doxycycline, Penicillins, and Prednisone   Social History   Socioeconomic History  . Marital status: Single    Spouse name: Not on file  . Number of children: Not on file  . Years of education: Not on file  . Highest education level: Not on file  Occupational History  . Not on file  Social Needs  . Financial resource strain: Not on file  . Food insecurity    Worry: Not on file    Inability: Not on file  . Transportation needs    Medical: Not on file    Non-medical: Not on file  Tobacco Use  . Smoking status: Current Every Day Smoker    Packs/day: 1.00    Years: 19.00    Pack years: 19.00    Types: Cigarettes  . Smokeless tobacco: Never Used  Substance and Sexual Activity  . Alcohol use: No  . Drug use: No  . Sexual activity: Not Currently    Partners: Male    Birth control/protection: Condom  Lifestyle  . Physical activity    Days per week: Not on file    Minutes per session: Not on file  . Stress: Not on file  Relationships  . Social Herbalist on phone: Not on file    Gets together: Not on file    Attends religious service: Not on file    Active member of club or organization: Not on file    Attends meetings of clubs or  organizations: Not on file    Relationship status: Not on file  Other Topics Concern  . Not on file  Social History Narrative  . Not on file     Family History: The patient's family history includes Asthma in her mother; Diabetes in her maternal grandmother; Hypertension in her maternal grandmother.  ROS:   Please see the history of present illness.     All other systems reviewed and are negative.  EKGs/Labs/Other Studies Reviewed:    The following studies were reviewed today:   EKG:   Feb. 11, 2020   - NSR at 76    NS ST abn in the inferior and anterior leads.   Recent Labs: 06/01/2018: BUN 5; Creatinine, Ser 0.70; Hemoglobin 16.2; Platelets 313; Potassium 4.2; Sodium 136  Recent Lipid Panel    Component Value Date/Time   CHOL 251 (H) 02/20/2015 0001   TRIG 516 (H) 02/20/2015 0001   HDL 29 (L) 02/20/2015 0001   CHOLHDL 8.7 (H) 02/20/2015 0001   VLDL NOT CALC 02/20/2015 0001   LDLCALC NOT CALC 02/20/2015 0001    Physical Exam:    Physical Exam: Blood pressure 118/86, pulse 87, height _0  (1.6 m), weight 215 lb 3.2 oz (97.6 kg), SpO2 92 %.  GEN: moderately obese, young female. NAD  HEENT: Normal NECK: No JVD; No carotid bruits LYMPHATICS: No lymphadenopathy CARDIAC: RRR  RESPIRATORY:   Bilateral wheezes  wheezing or rhonchi  ABDOMEN: Soft, non-tender, non-distended MUSCULOSKELETAL:  No edema;   SKIN: Warm and dry NEUROLOGIC:  Alert and oriented x 3   ASSESSMENT:    1. Hyperlipidemia, unspecified hyperlipidemia type   2. Essential hypertension   3. Chest pain of uncertain etiology   4. Bilateral leg edema    PLAN:    In order of problems listed above:  1.   Chest pain: She is no longer no longer having any episodes of chest discomfort.  She does not want to pursue work-up of her chest pain at this time since she is doing okay.  I have advised her to stop smoking.  She did want a copy of her CT scan that was ordered by the pulmonary department.  I have  asked her to request that from the pulmonary division since they are the ones who ordered it.  2.  Hyperlipidemia: We will check lipid levels, liver enzymes, basic metabolic profile today.  3.  Cigarette smoking:  I strongly encouraged her to stop smoking.  4.  Leg edema: Her main reason for being here today was because of leg swelling.  There is no significant edema on exam.  I have cautioned her about eating any extra salt.  We will get an echocardiogram for further evaluation.  I will have her come back in 6 months to see my APP.  Medication Adjustments/Labs and Tests Ordered: Current medicines are reviewed at length with the patient today.  Concerns regarding medicines are outlined above.  Orders Placed This Encounter  Procedures  . Lipid Profile  . Basic Metabolic Panel (BMET)  . Hepatic function panel  . ECHOCARDIOGRAM COMPLETE   No orders of the defined types were placed in this encounter.   Patient Instructions  Medication Instructions:  Your physician recommends that you continue on your current medications as directed. Please refer to the Current Medication list given to you today.  *If you need a refill on your cardiac medications before your next appointment, please call your pharmacy*  Lab Work: TODAY - cholesterol, liver panel, basic metabolic panel If you have labs (blood work) drawn today and your tests are completely normal, you will receive your results only by: Marland Kitchen MyChart Message (if you have MyChart) OR . A paper copy in the mail If you have any lab test that is abnormal or we need to change your treatment,  we will call you to review the results.   Testing/Procedures: Your physician has requested that you have an echocardiogram at [facility]. Echocardiography is a painless test that uses sound waves to create images of your heart. It provides your doctor with information about the size and shape of your heart and how well your heart's chambers and  valves are working. This procedure takes approximately one hour. There are no restrictions for this procedure.    Follow-Up: At [facility], you and your health needs are our priority.  As part of our continuing mission to provide you with exceptional heart care, we have created designated Provider Care Teams.  These Care Teams include your primary Cardiologist (physician) and Advanced Practice Providers (APPs -  Physician Assistants and Nurse Practitioners) who all work together to provide you with the care you need, when you need it.  Your next appointment:   6 months  The format for your next appointment:   Either In Person or Virtual  Provider:   You may see Mertie Moores, MD or one of the following Advanced Practice Providers on your designated Care Team:    Richardson Dopp, PA-C  [place], [place]  Daune Perch, [place]       Signed, Mertie Moores, MD  02/23/2019 5:39 PM    [place]

## 2019-03-01 ENCOUNTER — Other Ambulatory Visit: Payer: Self-pay

## 2019-03-01 ENCOUNTER — Ambulatory Visit (HOSPITAL_COMMUNITY)
Admission: RE | Admit: 2019-03-01 | Discharge: 2019-03-01 | Disposition: A | Discharge status: Home / Self Care | Payer: Medicaid Other | Source: Ambulatory Visit | Attending: Cardiovascular Disease | Admitting: Cardiovascular Disease

## 2019-03-01 DIAGNOSIS — R079 Chest pain, unspecified: Secondary | ICD-10-CM

## 2019-03-01 DIAGNOSIS — R600 Localized edema: Secondary | ICD-10-CM | POA: Diagnosis not present

## 2019-03-01 NOTE — Progress Notes (Signed)
*  PRELIMINARY RESULTS* Echocardiogram 2D Echocardiogram has been performed.  Emily [name] 03/01/2019, 1:47 PM

## 2019-03-11 NOTE — Telephone Encounter (Signed)
LEt her know [facility] does not have contract with [facility] medical records to share images. Some places like [facility] do but we do not .   You can print an image and mail it to her but let her know if that does not work then she can requiest for CD ROM

## 2019-03-22 ENCOUNTER — Telehealth: Payer: Self-pay | Admitting: Internal Medicine

## 2019-03-22 NOTE — Telephone Encounter (Signed)
Noted. If she has anymore questions we will make a virtual visit. Thanks

## 2019-03-22 NOTE — Telephone Encounter (Signed)
If she has more questios please set phone visit with an APP

## 2019-03-22 NOTE — Telephone Encounter (Signed)
Disregard- see phone note dated 03/22/19

## 2019-03-22 NOTE — Telephone Encounter (Signed)
Spoke with the pt  She is asking to look at image of her ct that shows the blockage that MR talked about  I advised this would be best viewed at her next ov and I have scheduled this for her  Nothing further needed

## 2019-03-22 NOTE — Telephone Encounter (Signed)
MR- we sent the pt images from her CT chest per her request and now she is emailing asking what they all mean  Please advise thanks

## 2019-04-18 ENCOUNTER — Ambulatory Visit: Payer: Medicaid Other | Admitting: Internal Medicine

## 2019-05-24 ENCOUNTER — Telehealth: Payer: Self-pay | Admitting: *Deleted

## 2019-05-24 NOTE — Telephone Encounter (Signed)
Pt states that she has been having no period for 3 months. Only spotting when its time for it. Also having vaginal itching and dryness. States that she is not pregnant. Wants to know what Victorino Dike thinks she should do.

## 2019-05-25 NOTE — Telephone Encounter (Signed)
Called patient and lmom to schedule an appointment.

## 2019-05-26 ENCOUNTER — Telehealth: Payer: Self-pay

## 2019-05-26 NOTE — Telephone Encounter (Signed)
NOTES ON FILE FROM [facility] [id number], SENT REFERRAL TO SCHEDULING

## 2019-07-11 ENCOUNTER — Telehealth: Payer: Self-pay | Admitting: *Deleted

## 2019-07-11 ENCOUNTER — Ambulatory Visit: Payer: Medicaid Other | Admitting: Adult Health

## 2019-07-11 NOTE — Telephone Encounter (Signed)
Patient left message that she is in "a lot of pain in her vaginal area".  States she has blisters with burning.  Wants to come in this morning to see Dr Emelda Fear.   Returned patient's call. States she is currently in the ER.  Will call back for f/u if needed.

## 2019-07-24 IMAGING — CT CT CHEST HIGH RESOLUTION W/O CM
2 of 3 series · 15 of 36 positions shown, 18 images · non-contrast
Comparison: Chest CT 10/26/2007.

CLINICAL DATA: 37-year-old female with history of right middle lobe
pulmonary nodules. Shortness of breath. Cough.

EXAM:
CT CHEST WITHOUT CONTRAST
TECHNIQUE: Multidetector CT imaging of the chest was performed following the
standard protocol without intravenous contrast. High resolution
imaging of the lungs, as well as inspiratory and expiratory imaging,
was performed.

[Series 3: thorax · axial · 0.65mm/px · z∈[-170,+86]mm · 12 of 152 slices shown, 15 images]
[im 12/152  mediastinal]
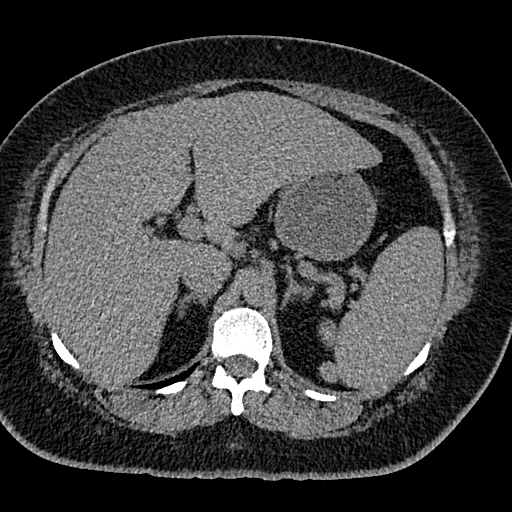
[im 12/152  lung]
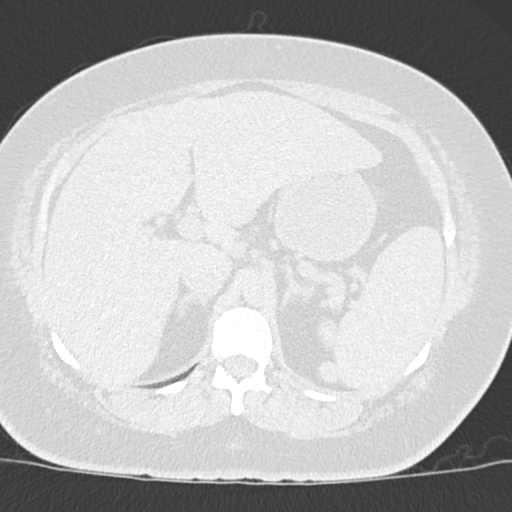
[im 23/152  lung]
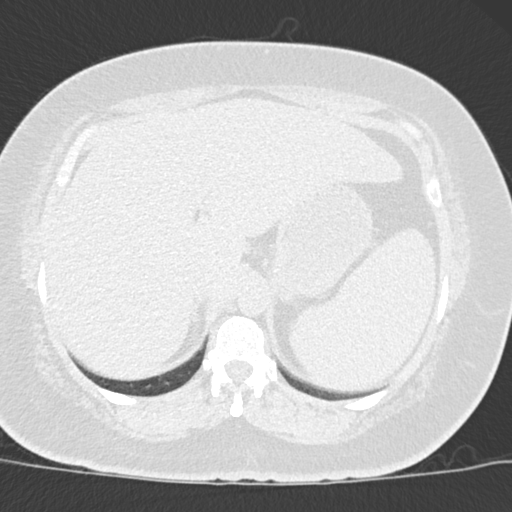
[im 34/152  lung]
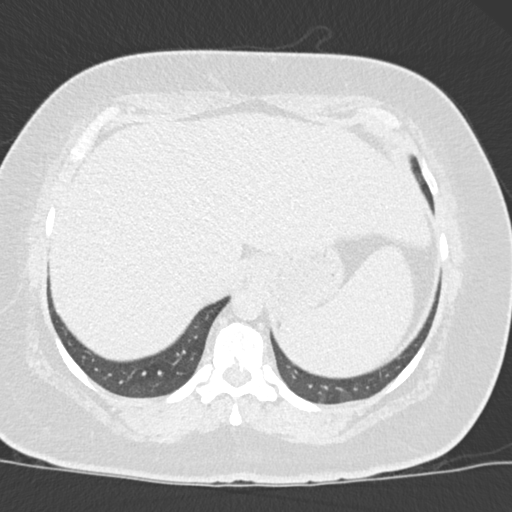
[im 45/152  lung]
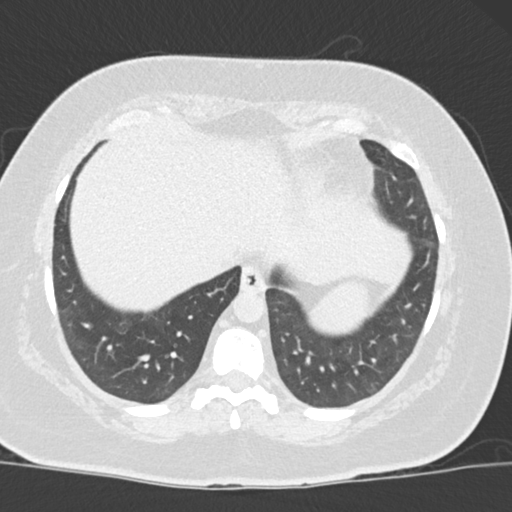
[im 56/152  mediastinal]
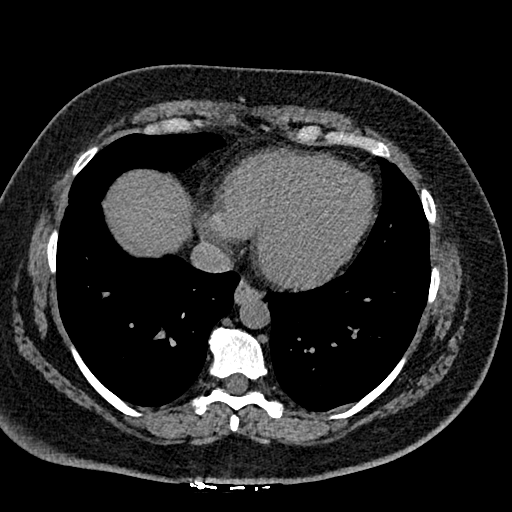
[im 56/152  lung]
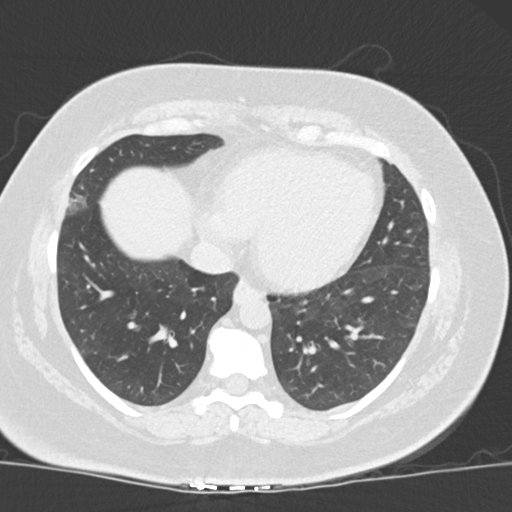
[im 68/152  lung]
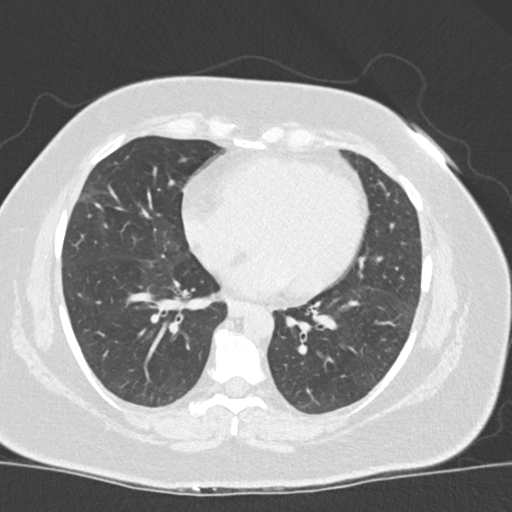
[im 84/152  lung]
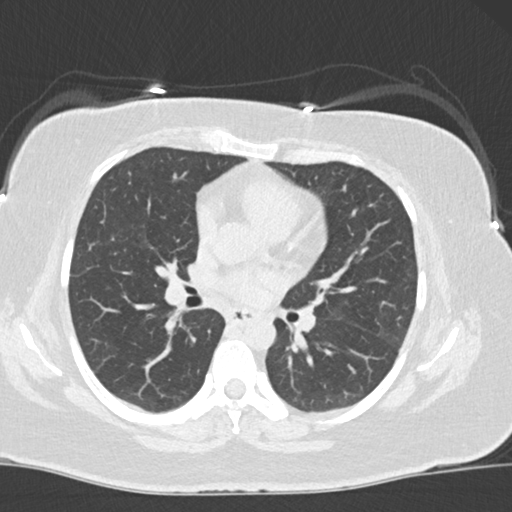
[im 96/152  lung]
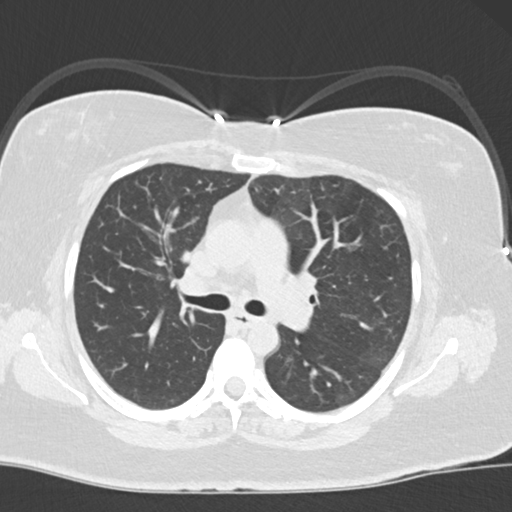
[im 107/152  mediastinal]
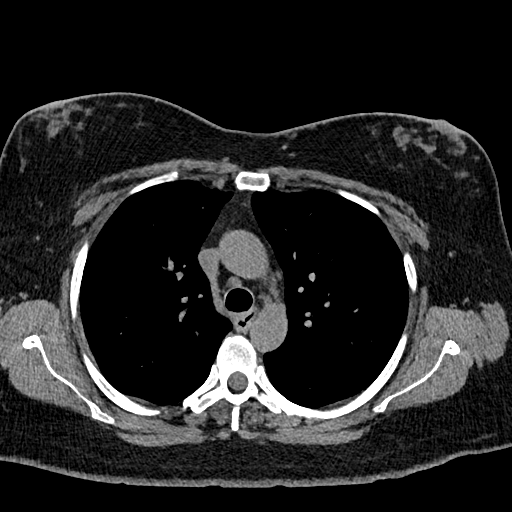
[im 107/152  lung]
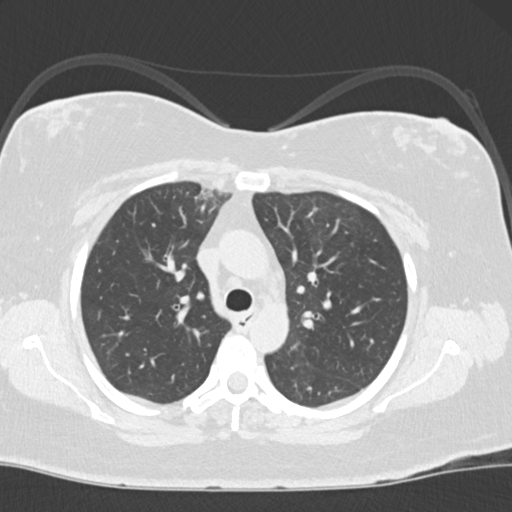
[im 118/152  lung]
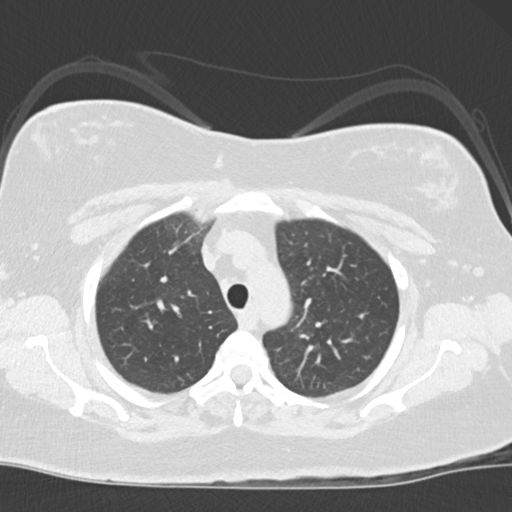
[im 129/152  lung]
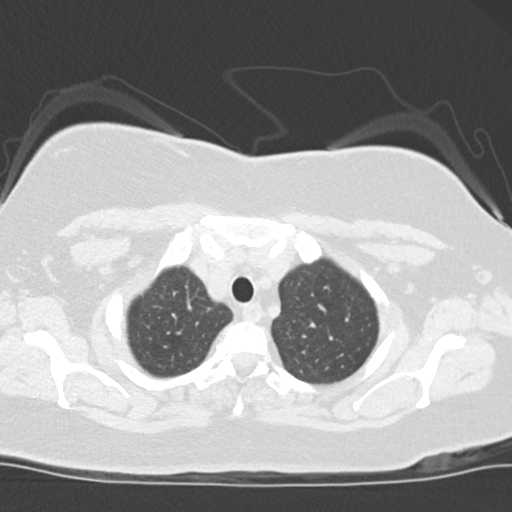
[im 140/152  lung]
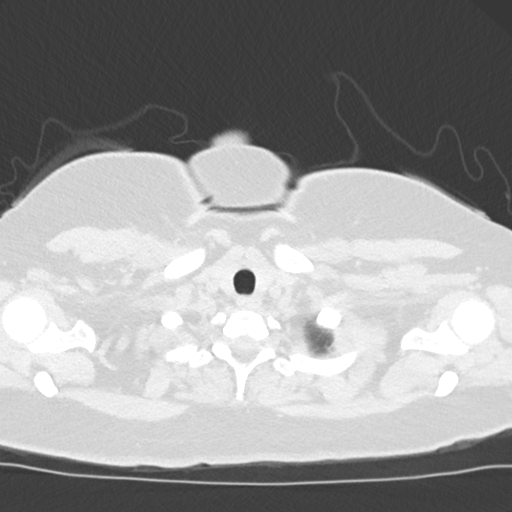

[Series 9: coronal · coronal · 0.60mm/px · 3 of 120 slices shown]
[im 24/120  lung]
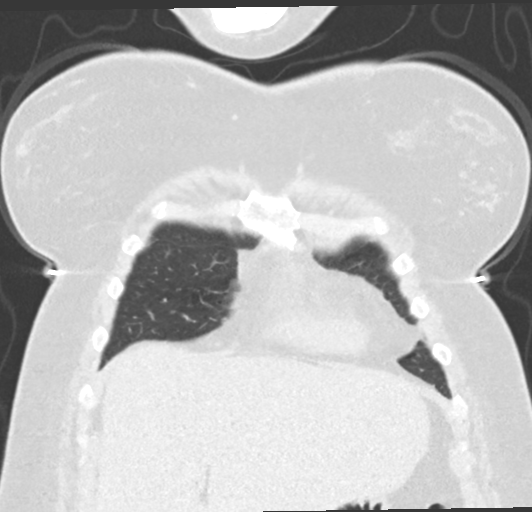
[im 48/120  lung]
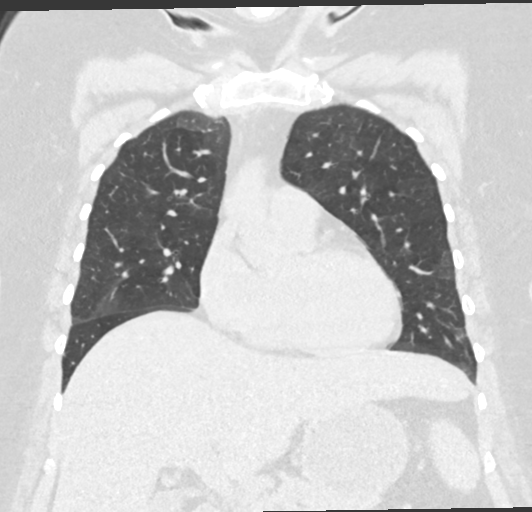
[im 72/120  lung]
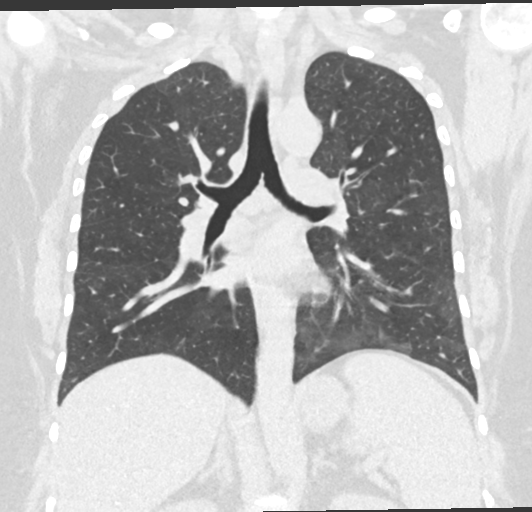

[15 of 36 positions shown; findings below may reference images not displayed]

FINDINGS: Cardiovascular: Heart size is normal. There is no significant
pericardial fluid, thickening or pericardial calcification. There is
aortic atherosclerosis, as well as atherosclerosis of the great
vessels of the mediastinum and the coronary arteries, including
calcified atherosclerotic plaque in the left anterior descending
coronary artery.

Mediastinum/Nodes: No pathologically enlarged mediastinal or hilar
lymph nodes. Please note that accurate exclusion of hilar adenopathy
is limited on noncontrast CT scans. Esophagus is unremarkable in
appearance. No axillary lymphadenopathy.

Lungs/Pleura: 5 mm right middle lobe nodule (axial image 66 of
series 7) and 4 mm right middle lobe nodule (axial image 74 of
series 7), both stable compared to prior study from 6662, considered
definitively benign. No other larger more suspicious appearing
pulmonary nodules or masses are noted. A few patchy areas of mild
ground-glass attenuation are noted, most evident in the lateral
segment of the right middle lobe and in the anterior aspect of the
right upper lobe medially. High-resolution images demonstrate no
significant regions of septal thickening, subpleural reticulation,
parenchymal banding, traction bronchiectasis or frank honeycombing.
Inspiratory and expiratory imaging is unremarkable. No acute
consolidative airspace disease. No pleural effusions.

Upper Abdomen: Mild diffuse low attenuation throughout the
visualized hepatic parenchyma, indicative of hepatic steatosis.

Musculoskeletal: There are no aggressive appearing lytic or blastic
lesions noted in the visualized portions of the skeleton.
IMPRESSION: 1. There are few patchy areas of mild ground-glass attenuation in
the lungs bilaterally, which are nonspecific. The possibility of
interstitial lung disease is not entirely excluded, but is not
favored. At this time, findings would be classified as alternative
diagnosis per current ATS guidelines. If there is persistent
clinical concern for interstitial lung disease, repeat
high-resolution chest CT could be obtained in 12 months to assess
for temporal changes in the appearance of the lung parenchyma.
2. Aortic atherosclerosis, in addition to left anterior descending
coronary artery disease. Please note that although the presence of
coronary artery calcium documents the presence of coronary artery
disease, the severity of this disease and any potential stenosis
cannot be assessed on this non-gated CT examination. Assessment for
potential risk factor modification, dietary therapy or pharmacologic
therapy may be warranted, if clinically indicated.
3. Stable small pulmonary nodules in the right middle lobe dating
back to 6662, considered definitively benign.
4. Mild hepatic steatosis.

Aortic Atherosclerosis (HQG2O-CG9.9).

## 2019-08-08 ENCOUNTER — Other Ambulatory Visit: Payer: Self-pay | Admitting: Internal Medicine

## 2019-08-16 ENCOUNTER — Other Ambulatory Visit: Payer: Self-pay | Admitting: Cardiovascular Disease

## 2019-08-25 ENCOUNTER — Telehealth: Payer: Self-pay | Admitting: Cardiovascular Disease

## 2019-08-25 MED ORDER — AMLODIPINE BESYLATE 10 MG PO TABS: 10.0000 mg | ORAL_TABLET | Freq: Every day | ORAL | 0 refills | Status: DC

## 2019-08-25 MED ORDER — PROPRANOLOL HCL 20 MG PO TABS: 20.0000 mg | ORAL_TABLET | Freq: Three times a day (TID) | ORAL | 0 refills | Status: DC

## 2019-08-25 NOTE — Telephone Encounter (Signed)
Patient wanted to advise Dr. Elease Hashimoto that had she went to [facility] and was admitted for 16 days. She had an EKG and Echo done while there.

## 2019-08-25 NOTE — Telephone Encounter (Signed)
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 She states cardiac tests were normal in the hospital but she is unable to come in for an appointment because she has open wounds and is not supposed to go out of her home. She needs refills on cardiac medications which I agreed to send to last until her follow-up. I scheduled her to see Tereso Newcomer, PA on 8/6 and advised her to call back if she is unable to keep that appointment date/time. She verbalized understanding and agreement with plan and thanked me for the call.

## 2019-08-25 NOTE — Telephone Encounter (Signed)
Left message for patient to call back regarding questions about when and if follow-up is needed

## 2019-09-13 ENCOUNTER — Telehealth: Payer: Self-pay | Admitting: Cardiovascular Disease

## 2019-09-13 NOTE — Telephone Encounter (Signed)
New Message  Pt called and stated that she had an ECHO done while in the hospital and she had some questions about the results.  Please call to discuss

## 2019-09-13 NOTE — Telephone Encounter (Signed)
Received call from patient who called to ask if we have received the echo results from [facility]. I advised that it will take more times to get the results and I answered her questions about trace tricuspid valve regurgitation. I advised that there is nothing more to do at this time and that we will follow this over the years to come. She asks about the 35% she saw on the report and I advised that I saw on Care Everywhere that the EF on the echo at [facility] was 55-60% which is normal and that I don't know what measurement she saw that was 35%. She requests a call back if we see any abnormalities when we receive the report. She thanked me for answering her questions.

## 2019-09-13 NOTE — Telephone Encounter (Signed)
Follow up   Transferred telephone call from patient per previous message to [facility].

## 2019-09-13 NOTE — Telephone Encounter (Signed)
Left message for patient to call back to discuss recent echo findings as I do not have access to those results.

## 2019-09-26 ENCOUNTER — Telehealth: Payer: Self-pay | Admitting: Cardiovascular Disease

## 2019-09-26 NOTE — Telephone Encounter (Signed)
Patient is retuning call.

## 2019-09-26 NOTE — Telephone Encounter (Signed)
Returned call to patient. She states that she had an episode of her heart racing the other day while she was watching TV. She states that it lasted a couple of hours and went away when she went to sleep. She states that it happened again yesterday and lasted about 30 minutes. She states that her HR on her pulse Ox has been around 60 bpm even during her episode. Denies any recurrent episodes. Denies having any chest pain, SOB, lightheadedness, dizziness, or any other Sx. Denies increase in alcohol, caffeine, or stress. Denies Sx of OSA. Patient takes Propranolol 20 mg TID. Patient seems anxious on the phone and required reassurance regarding her trivial regurgitation on an echo from an outside hospital. It appears Dr. Harvie Bridge RN has reassured this patient a few times already regarding this.  I have instructed the patient to continue to monitor and let us know if her episodes become more frequent and we may consider ordering a monitor. She verbalized understanding and thanked me for the call.

## 2019-09-26 NOTE — Telephone Encounter (Signed)
If her pulse ox says her HR is 60,  I think it is correct.  Has she stopped smoking  ? Has she started an exercise program / is she walking regularly ? She previously at lots of junk food.  Has she improved her diet ? She should see her primary MD.     If she continues to have palpitations, she may see an APP

## 2019-09-26 NOTE — Telephone Encounter (Signed)
Left message for patient to call back  

## 2019-09-26 NOTE — Telephone Encounter (Signed)
Patient c/o Palpitations:  High priority if patient c/o lightheadedness, shortness of breath, or chest pain  1) How long have you had palpitations/irregular HR/ Afib? Are you having the symptoms now? Patient states she has had palpitations for a few days  2) Are you currently experiencing lightheadedness, SOB or CP? No  3) Do you have a history of afib (atrial fibrillation) or irregular heart rhythm? No  4) Have you checked your BP or HR? (document readings if available): No  5) Are you experiencing any other symptoms? No

## 2019-09-26 NOTE — Telephone Encounter (Signed)
Called and made patient aware of recommendations below. Patient is still smoking, is not exercising, and is still eating poorly. Patient education provided in all areas. She will let us know if she continues to have palpitations.

## 2019-10-21 ENCOUNTER — Ambulatory Visit: Payer: Medicaid Other | Admitting: Cardiovascular Disease

## 2019-11-05 ENCOUNTER — Other Ambulatory Visit: Payer: Self-pay | Admitting: Internal Medicine

## 2019-11-07 ENCOUNTER — Telehealth: Payer: Self-pay | Admitting: Internal Medicine

## 2019-11-07 MED ORDER — MONTELUKAST SODIUM 10 MG PO TABS: 10.0000 mg | ORAL_TABLET | Freq: Every day | ORAL | 1 refills | Status: DC

## 2019-11-07 NOTE — Telephone Encounter (Signed)
Called and spoke with Patient.  Patient requested Singulair refill to [facility]. Prescription sent to requested pharmacy.  Patient last OV 02/14/19, with [name],NP. Patient scheduled follow up Tele Visit 11/10/19, at 1500. Nothing further at this time.

## 2019-11-10 ENCOUNTER — Encounter: Payer: Self-pay | Admitting: Adult Health

## 2019-11-10 ENCOUNTER — Ambulatory Visit (INDEPENDENT_AMBULATORY_CARE_PROVIDER_SITE_OTHER): Payer: Medicaid Other | Admitting: Adult Health

## 2019-11-10 ENCOUNTER — Other Ambulatory Visit: Payer: Self-pay

## 2019-11-10 ENCOUNTER — Other Ambulatory Visit: Payer: Self-pay | Admitting: Adult Health

## 2019-11-10 DIAGNOSIS — R9389 Abnormal findings on diagnostic imaging of other specified body structures: Secondary | ICD-10-CM | POA: Diagnosis not present

## 2019-11-10 DIAGNOSIS — F17200 Nicotine dependence, unspecified, uncomplicated: Secondary | ICD-10-CM

## 2019-11-10 DIAGNOSIS — R0602 Shortness of breath: Secondary | ICD-10-CM

## 2019-11-10 DIAGNOSIS — R918 Other nonspecific abnormal finding of lung field: Secondary | ICD-10-CM | POA: Diagnosis not present

## 2019-11-10 MED ORDER — ALBUTEROL SULFATE HFA 108 (90 BASE) MCG/ACT IN AERS: 2.0000 | INHALATION_SPRAY | RESPIRATORY_TRACT | 3 refills | Status: DC | PRN

## 2019-11-10 MED ORDER — IPRATROPIUM-ALBUTEROL 0.5-2.5 (3) MG/3ML IN SOLN: 3.0000 mL | Freq: Four times a day (QID) | RESPIRATORY_TRACT | 3 refills | Status: DC | PRN

## 2019-11-10 NOTE — Patient Instructions (Addendum)
Mucinex DM Twice daily  As needed  Cough/congestion  Work on not smoking .  Albuterol inhaler or Duoneb as needed wheezing  Continue on Singulair daily .  Follow up with Dr. Marchelle Gearing or Raynetta Osterloh NP  in 3-4  months and As needed  With Chest xray and PFT  Please contact office for sooner follow up if symptoms do not improve or worsen or seek emergency care

## 2019-11-10 NOTE — Progress Notes (Signed)
Virtual Visit via Telephone Note  I connected with Halliday on 11/10/19 at  3:00 PM EDT by telephone and verified that I am speaking with the correct person using two identifiers.  Location: Patient: Home  Provider: Office    I discussed the limitations, risks, security and privacy concerns of performing an evaluation and management service by telephone and the availability of in person appointments. I also discussed with the patient that there may be a patient responsible charge related to this service. The patient expressed understanding and agreed to proceed.   History of Present Illness: 39 year old female active smoker (1.5 pack a day) followed for chronic rhinitis /bronchitis and abnormal CT chest .   Today's televisit is a 52-monthfollow-up 679-monthollow-up for chronic rhinitis and bronchitis along with abnormal CT chest.  Patient says overall her breathing has been doing well since last visit.  She denies any flare of cough or wheezing.  She does continue to smoke very heavily at 1.5 packs a day.  She is not on any maintenance inhaler.  Uses albuterol inhaler on occasion.  Says it when she gets bronchitis she will use DuoNeb nebulizer.  She does need refills.  She remains on Singulair daily.  She says she was admitted earlier this year with necrotizing fasciitis.  She says she has improved and has been home for a few months.        Patient Active Problem List   Diagnosis Date Noted  . Breast tenderness 09/20/2018  . Gastroesophageal reflux disease 09/16/2018  . Shortness of breath 08/05/2018  . Abnormal finding on lung imaging 08/05/2018  . 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  . Vaginal odor 09/30/2017  . BV (bacterial vaginosis) 09/30/2017  . Swollen lymph nodes 01/06/2017  . Vaginal irritation 10/08/2016  . RUQ pain 10/08/2016  . LLQ pain 10/08/2016  . Vaginal discharge 10/08/2016  . Menstrual period late 10/08/2016  . Cyst of right Bartholin's gland 12/17/2015   . Trichomonal vaginitis 10/16/2015  . Recurrent boils 06/18/2015  . Type 2 diabetes mellitus with complication, without long-term current use of insulin (HC[facility]12/28/2016  . Need for influenza vaccination 04/18/2015  . Ingrowing toenail 04/18/2015  . Vitamin D deficiency 04/18/2015  . Angular cheilitis 04/18/2015  . Dyslipidemia 04/18/2015  . Diabetes mellitus with complication in adult patient ([facility]11/04/2014  . Smoker 02/20/2015  . Essential hypertension 02/20/2015  . Pain in limb 02/20/2015  . Myalgia 02/20/2015  . Other specified hypothyroidism 02/20/2015  . Leg pain, inferior 02/20/2015  . Toenail deformity 02/20/2015  . Elevated liver enzymes 01/07/2012  . Abnormal CT of liver 09/30/2011  . Fatty liver 09/30/2011  . G E REFLUX 11/30/2007  . CHEST PAIN-UNSPECIFIED 11/30/2007  . HEADACHE 10/21/2007  . DYSPNEA 09/15/2007  . UNSPECIFIED TACHYCARDIA 06/01/2007  . COUGH 05/24/2007  . URINARY INCONTINENCE 05/24/2007  . TOBACCO ABUSE 05/20/2007  . ASTHMATIC BRONCHITIS, ACUTE 05/18/2007  . PULMONARY NODULE 05/12/2007  . DEPRESSION 04/19/2007  . ASTHMA 04/19/2007   Current Outpatient Medications on File Prior to Visit  Medication Sig Dispense Refill  . ACCU-CHEK FASTCLIX LANCETS MISC USE AS DIRECTED 102 each 3  . amLODipine (NORVASC) 10 MG tablet Take 1 tablet (10 mg total) by mouth daily. 90 tablet 0  . Blood Glucose Monitoring Suppl (ACCU-CHEK AVIVA PLUS) w/Device KIT USE AS DIRECTED 1 kit 0  . lamoTRIgine (LAMICTAL) 150 MG tablet Take 150 mg by mouth daily.      . Marland Kitchenevothyroxine (SYNTHROID, LEVOTHROID) 75 MCG tablet Take  1 tablet (75 mcg total) by mouth daily before breakfast. 90 tablet 3  . montelukast (SINGULAIR) 10 MG tablet Take 1 tablet (10 mg total) by mouth at bedtime. 90 tablet 1  . nitroGLYCERIN (NITROSTAT) 0.4 MG SL tablet Place 1 tablet (0.4 mg total) under the tongue every 5 (five) minutes as needed for chest pain. 25 tablet 6  . paroxetine mesylate (PEXEVA) 20 MG  tablet Take 20 mg by mouth daily.      . propranolol (INDERAL) 20 MG tablet Take 1 tablet (20 mg total) by mouth 3 (three) times daily. 270 tablet 0  . ziprasidone (GEODON) 40 MG capsule Take 60 mg by mouth daily.     Marland Kitchen albuterol (VENTOLIN HFA) 108 (90 Base) MCG/ACT inhaler Inhale 2 puffs into the lungs every 4 (four) hours as needed. (Patient not taking: Reported on 11/10/2019) 18 g 3  . omeprazole (PRILOSEC) 20 MG capsule Take 1 capsule (20 mg total) by mouth daily. (Patient not taking: Reported on 11/10/2019) 30 capsule 11   Current Facility-Administered Medications on File Prior to Visit  Medication Dose Route Frequency Provider Last Rate Last Admin  . clindamycin (CLEOCIN) 2 % vaginal cream 1 Applicatorful  1 Applicatorful Vaginal QHS Jonnie Kind, MD          Observations/Objective: 05/05/2018  Ace - 26 Esr - 30 Rh<14 CCP<16 ANCA - negative  Hypersensitive activity pneumonitis panel negative Anti-Jo 1 less than 0.2 RNP antibodies greater than 8 Sjogren's syndrome antibodies negative Anti-scleroderma antibody less than 1 Aldolase 4.5 MPO/PR-3 (ANCA) antibodies-less than 1 Total CK 47, CK-MB less than 7 -negative   04/01/2018-CT chest high resolution- few patchy areas of mild groundglass attenuation in the lungs bilaterally nonspecific, stable pulmonary nodules from 2009   01/08/2015- spirometry- FVC 1.33 (38% predicted), ratio 72, FEV1 1.01 (37% predicted)  Assessment and Plan: Chronic bronchitis-appears to be doing well.  Smoking cessation is encouraged. Last spirometry was in 2016.  We will repeat PFTs on return  Abnormal CT chest.  Previous CT in 2019 showed stable lung nodules.  She did have some mild groundglass attenuation.  We will check chest x-ray on follow-up visit.  And decide if further CT imaging is indicated.  Plan  Patient Instructions  Mucinex DM Twice daily  As needed  Cough/congestion  Work on not smoking .  Albuterol inhaler or Duoneb as needed  wheezing  Continue on Singulair daily .  Follow up with Dr. Chase Caller or Cosby Proby NP  in 3-4  months and As needed  With Chest xray and PFT  Please contact office for sooner follow up if symptoms do not improve or worsen or seek emergency care        Follow Up Instructions:    I discussed the assessment and treatment plan with the patient. The patient was provided an opportunity to ask questions and all were answered. The patient agreed with the plan and demonstrated an understanding of the instructions.   The patient was advised to call back or seek an in-person evaluation if the symptoms worsen or if the condition fails to improve as anticipated.  I provided 22 minutes of non-face-to-face time during this encounter.   Rexene Edison, NP

## 2019-11-15 ENCOUNTER — Telehealth: Payer: Self-pay | Admitting: Internal Medicine

## 2019-11-15 MED ORDER — NEBULIZER/TUBING/MOUTHPIECE KIT: PACK | 11 refills | Status: AC

## 2019-11-15 MED ORDER — COMPRESSOR/NEBULIZER MISC: 0 refills | Status: AC

## 2019-11-15 NOTE — Telephone Encounter (Signed)
Spoke with the pt   She is asking for new neb machine and supplies to be sent to [facility]  Rxs sent  Nothing further needed

## 2019-11-25 ENCOUNTER — Ambulatory Visit: Payer: Medicaid Other | Admitting: Physician Assistant

## 2019-11-28 ENCOUNTER — Encounter: Payer: Self-pay | Admitting: Cardiovascular Disease

## 2019-11-28 ENCOUNTER — Other Ambulatory Visit: Payer: Self-pay

## 2019-11-28 ENCOUNTER — Ambulatory Visit (INDEPENDENT_AMBULATORY_CARE_PROVIDER_SITE_OTHER): Payer: Medicaid Other | Admitting: Cardiovascular Disease

## 2019-11-28 VITALS — BP 124/84 | HR 79 | Ht 63.0 in | Wt 206.2 lb

## 2019-11-28 DIAGNOSIS — R000 Tachycardia, unspecified: Secondary | ICD-10-CM | POA: Diagnosis not present

## 2019-11-28 DIAGNOSIS — E785 Hyperlipidemia, unspecified: Secondary | ICD-10-CM

## 2019-11-28 DIAGNOSIS — I10 Essential (primary) hypertension: Secondary | ICD-10-CM

## 2019-11-28 MED ORDER — AMLODIPINE BESYLATE 10 MG PO TABS: 10.0000 mg | ORAL_TABLET | Freq: Every day | ORAL | 0 refills | Status: DC

## 2019-11-28 MED ORDER — PROPRANOLOL HCL 20 MG PO TABS: 20.0000 mg | ORAL_TABLET | Freq: Three times a day (TID) | ORAL | 0 refills | Status: DC

## 2019-11-28 NOTE — Patient Instructions (Signed)

## 2019-11-28 NOTE — Progress Notes (Signed)
Cardiology Office Note:    Date:  11/28/2019   ID:  [name], DOB 25-Aug-1980, MRN [id number]  PCP:  Harlene Salts, PA-C  Cardiologist:  Mertie Moores, MD  Electrophysiologist:  None   Problem list 1.  Coronary artery calcifications 2.  Hyperlipidemia 3.  Hypertension 4.  Hypothyroidism 5.  Diabetes mellitus  Referring MD: Harlene Salts, PA-C   Chief Complaint  Patient presents with  . Hyperlipidemia     Feb. 11, 2020    Emily [name] is a 39 y.o. female with a hx of hypertension and hyperlipidemia.   Was seen with her sister Emily [name].   She recently had a CT scan of the lungs and was incidentally found to have coronary artery calcifications. We are asked to see her today by Harlene Salts, PA-C for further evaluations of these coronary artery calcifications   Emily [name] had a CT scan for further evaluation of some shortness of breath.  The shortness of breath seems to be worsening over the past several months.  She was incidentally found to have coronary artery calcifications.  Has occasional chest tightness/ pressure like sensation  with exertion.     She has these episodes of tightness whenever she does anything exertional such as climbing stairs or walking from the parking lot into a store. The pain is described as a pressure-like pain or a tightness.  It last for as long as she is walking and then resolves after several minutes after she stops walking.  She is never had any prolonged episodes.  Does not do any regular exercise   Eats lots of junk food.    Does not pay attention to her diet Goes to [employer] lots.    Eats late at night  is not working  - trying to get disability .    + family hx of CAD  - mother and father both had cad   March 05, 2019:  Emily [name]  seen today for follow-up visit.  I saw her earlier this year.  She had a history of coronary artery calcifications and had symptoms that were worrisome for unstable angina.  She has a  strong family history of coronary disease and has multiple risk factors including hyperlipidemia and cigarette smoking.  I saw her in Jan.   For angina . Has lots of CP with anxiety. No exercise per se.  Complains of lung issues.   Still smoking .  She is not having any further episodes of angina.  She admits that she is very stressed out.  Complains of 4 dimples in her leg.  She complains of leg swelling.  She admits that she eats salty foods.  She likes soup.  Still eats some salty foods.   Sees pulmonary   November 28, 2019:  No complaints,  Here for follow up   No further episodes of leg edema  Still smoking   Had necrotizing fascitis in May  - was hospitalized for 16 days in [place] in  [place]  No further cp  Has occasional palpitations .     Past Medical History:  Diagnosis Date  . Abnormal CT of liver   . Anxiety   . Asthma   . Asthma   . Bipolar 1 disorder ([place])   . Bipolar disorder ([place])   . Chronic pain   . Depression   . Fatty liver   . GERD (gastroesophageal reflux disease)   . Headache(784.0)   . Hyperlipidemia   .  Hypertension   . Pulmonary nodule   . Tachycardia   . Thyroid disease     Past Surgical History:  Procedure Laterality Date  . DILATION AND CURETTAGE OF UTERUS     1 yr ago    Current Medications: Current Meds  Medication Sig  . ACCU-CHEK FASTCLIX LANCETS MISC USE AS DIRECTED  . albuterol (VENTOLIN HFA) 108 (90 Base) MCG/ACT inhaler Inhale 2 puffs into the lungs every 4 (four) hours as needed.  Marland Kitchen amitriptyline (ELAVIL) 25 MG tablet Take 50 mg by mouth at bedtime.  Marland Kitchen amLODipine (NORVASC) 10 MG tablet Take 1 tablet (10 mg total) by mouth daily.  . Blood Glucose Monitoring Suppl (ACCU-CHEK AVIVA PLUS) w/Device KIT USE AS DIRECTED  . clonazePAM (KLONOPIN) 0.5 MG disintegrating tablet Take 0.5 mg by mouth daily as needed.  . insulin glargine (LANTUS SOLOSTAR) 100 UNIT/ML Solostar Pen Inject 26 Units into the skin at bedtime. Can  increase to 40 units/day  . Insulin Pen Needle 32G X 4 MM MISC 1 each by Does not apply route as needed.  Marland Kitchen ipratropium-albuterol (DUONEB) 0.5-2.5 (3) MG/3ML SOLN Take 3 mLs by nebulization every 6 (six) hours as needed.  . lamoTRIgine (LAMICTAL) 150 MG tablet Take 150 mg by mouth daily.    Marland Kitchen levothyroxine (SYNTHROID, LEVOTHROID) 75 MCG tablet Take 1 tablet (75 mcg total) by mouth daily before breakfast.  . montelukast (SINGULAIR) 10 MG tablet Take 1 tablet (10 mg total) by mouth at bedtime.  . Nebulizers (COMPRESSOR/NEBULIZER) MISC Use as directed  . nitroGLYCERIN (NITROSTAT) 0.4 MG SL tablet Place 1 tablet (0.4 mg total) under the tongue every 5 (five) minutes as needed for chest pain.  Marland Kitchen omeprazole (PRILOSEC) 20 MG capsule Take 1 capsule (20 mg total) by mouth daily.  . paroxetine mesylate (PEXEVA) 20 MG tablet Take 20 mg by mouth daily.    Marland Kitchen PAXIL 20 MG tablet Take 20 mg by mouth daily.  . propranolol (INDERAL) 20 MG tablet Take 1 tablet (20 mg total) by mouth 3 (three) times daily.  Marland Kitchen Respiratory Therapy Supplies (NEBULIZER/TUBING/MOUTHPIECE) KIT Use as directed  . [DISCONTINUED] amLODipine (NORVASC) 10 MG tablet Take 1 tablet (10 mg total) by mouth daily.  . [DISCONTINUED] propranolol (INDERAL) 20 MG tablet Take 1 tablet (20 mg total) by mouth 3 (three) times daily.   Current Facility-Administered Medications for the 11/28/19 encounter (Office Visit) with Lumir Demetriou, Wonda Cheng, MD  Medication  . clindamycin (CLEOCIN) 2 % vaginal cream 1 Applicatorful     Allergies:   Bactrim [sulfamethoxazole-trimethoprim], Ciprofloxacin, Flagyl [metronidazole], Iodinated diagnostic agents, Doxycycline, Penicillins, and Prednisone   Social History   Socioeconomic History  . Marital status: Single    Spouse name: Not on file  . Number of children: Not on file  . Years of education: Not on file  . Highest education level: Not on file  Occupational History  . Not on file  Tobacco Use  . Smoking status:  Current Every Day Smoker    Packs/day: 1.00    Years: 19.00    Pack years: 19.00    Types: Cigarettes  . Smokeless tobacco: Never Used  Vaping Use  . Vaping Use: Never assessed  Substance and Sexual Activity  . Alcohol use: No  . Drug use: No  . Sexual activity: Not Currently    Partners: Male    Birth control/protection: Condom  Other Topics Concern  . Not on file  Social History Narrative  . Not on file   Social Determinants  of Health   Financial Resource Strain:   . Difficulty of Paying Living Expenses:   Food Insecurity:   . Worried About Charity fundraiser in the Last Year:   . Arboriculturist in the Last Year:   Transportation Needs:   . Film/video editor (Medical):   Marland Kitchen Lack of Transportation (Non-Medical):   Physical Activity:   . Days of Exercise per Week:   . Minutes of Exercise per Session:   Stress:   . Feeling of Stress :   Social Connections:   . Frequency of Communication with Friends and Family:   . Frequency of Social Gatherings with Friends and Family:   . Attends Religious Services:   . Active Member of Clubs or Organizations:   . Attends Archivist Meetings:   Marland Kitchen Marital Status:      Family History: The patient's family history includes Asthma in her mother; Diabetes in her maternal grandmother; Hypertension in her maternal grandmother.  ROS:   Please see the history of present illness.     All other systems reviewed and are negative.  EKGs/Labs/Other Studies Reviewed:    The following studies were reviewed today:   EKG:   Feb. 11, 2020   - NSR at 76    NS ST abn in the inferior and anterior leads.   Recent Labs: No results found for requested labs within last 8760 hours.  Recent Lipid Panel    Component Value Date/Time   CHOL 251 (H) 02/20/2015 0001   TRIG 516 (H) 02/20/2015 0001   HDL 29 (L) 02/20/2015 0001   CHOLHDL 8.7 (H) 02/20/2015 0001   VLDL NOT CALC 02/20/2015 0001   LDLCALC NOT CALC 02/20/2015 0001     Physical Exam:    Physical Exam: Blood pressure 124/84, pulse 79, height _0  (1.6 m), weight 206 lb 3.2 oz (93.5 kg), SpO2 93 %.  GEN:  Young female.  HEENT: Normal NECK: No JVD; No carotid bruits LYMPHATICS: No lymphadenopathy CARDIAC: RRR , no murmurs, rubs, gallops RESPIRATORY:  Clear to auscultation without rales, wheezing or rhonchi  ABDOMEN: Soft, non-tender, non-distended MUSCULOSKELETAL:  No edema; No deformity  SKIN: Warm and dry NEUROLOGIC:  Alert and oriented x 3  ECG: Normal sinus rhythm at 79.  Nonspecific ST and T wave changes.  No changes from previous.  ASSESSMENT:    1. Essential hypertension   2. Hyperlipidemia, unspecified hyperlipidemia type   3. Sinus tachycardia    PLAN:    In order of problems listed above:  1.   Chest pain: Not having any further episodes of chest discomfort. 2.  Hyperlipidemia:    Encouraged her to work on diet.  She needs to quit smoking.  She needs to exercise.  She does have some coronary artery calcifications.  She does not have angina.  3.  Cigarette smoking: Encouraged her to stop smoking.    4.  Leg edema:  Improved      Medication Adjustments/Labs and Tests Ordered: Current medicines are reviewed at length with the patient today.  Concerns regarding medicines are outlined above.  Orders Placed This Encounter  Procedures  . EKG 12-Lead   Meds ordered this encounter  Medications  . propranolol (INDERAL) 20 MG tablet    Sig: Take 1 tablet (20 mg total) by mouth 3 (three) times daily.    Dispense:  270 tablet    Refill:  0  . amLODipine (NORVASC) 10 MG tablet    Sig:  Take 1 tablet (10 mg total) by mouth daily.    Dispense:  90 tablet    Refill:  0    Patient Instructions  Medication Instructions:  Your physician recommends that you continue on your current medications as directed. Please refer to the Current Medication list given to you today.  *If you need a refill on your cardiac medications before  your next appointment, please call your pharmacy*   Lab Work: None Ordered If you have labs (blood work) drawn today and your tests are completely normal, you will receive your results only by: Marland Kitchen MyChart Message (if you have MyChart) OR . A paper copy in the mail If you have any lab test that is abnormal or we need to change your treatment, we will call you to review the results.   Testing/Procedures: None Ordered   Follow-Up: At [facility], you and your health needs are our priority.  As part of our continuing mission to provide you with exceptional heart care, we have created designated Provider Care Teams.  These Care Teams include your primary Cardiologist (physician) and Advanced Practice Providers (APPs -  Physician Assistants and Nurse Practitioners) who all work together to provide you with the care you need, when you need it.      Your next appointment:   1 year(s)  The format for your next appointment:   In Person  Provider:   You may see Mertie Moores, MD or one of the following Advanced Practice Providers on your designated Care Team:    Richardson Dopp, PA-C  Robbie Lis, [place]        Signed, Mertie Moores, MD  11/28/2019 5:21 PM    [place]

## 2020-01-17 MED ORDER — PROPRANOLOL HCL 20 MG PO TABS: 20.0000 mg | ORAL_TABLET | Freq: Three times a day (TID) | ORAL | 3 refills | Status: DC

## 2020-01-19 MED ORDER — AMLODIPINE BESYLATE 10 MG PO TABS: 10.0000 mg | ORAL_TABLET | Freq: Every day | ORAL | 3 refills | Status: DC

## 2020-01-19 NOTE — Addendum Note (Signed)
Addended by: Theresia Majors on: 01/19/2020 07:44 AM   Modules accepted: Orders

## 2020-02-06 ENCOUNTER — Telehealth: Payer: Self-pay | Admitting: Cardiovascular Disease

## 2020-02-06 NOTE — Telephone Encounter (Signed)
Pt c/o swelling: STAT is pt has developed SOB within 24 hours  1) How much weight have you gained and in what time span? She is not sure ,   2) If swelling, where is the swelling located? Her left ankle   3) Are you currently taking a fluid pill? No   4) Are you currently SOB? No  5) Do you have a log of your daily weights (if so, list)? No  6) Have you gained 3 pounds in a day or 5 pounds in a week? She is not sure   7) Have you traveled recently? No   Pt stated the swelling started Saturday. She stated the other leg feels like her calf is tight when she walks.    Best number -[phone]

## 2020-02-06 NOTE — Telephone Encounter (Signed)
Patient calling back.   °

## 2020-02-06 NOTE — Telephone Encounter (Signed)
Follow up:     Patient is returning a call back concering her swelling. Please call back.

## 2020-02-06 NOTE — Telephone Encounter (Signed)
Left message for patient to call back  

## 2020-02-06 NOTE — Telephone Encounter (Signed)
Patient calling in due to left ankle pain and right calf swelling. States that her right calf feels tight when she walks but is not visibly swollen. Denies any redness or heat to either site. Denies CP, SOB or weight gain. Patient does not recall falling or having any other issues with her legs in the past.   Advised patient to reach out to PCP and if swelling or pain worsens to go to urgent care to be evaluated for DVT, etc. She verbalized understanding and is aware to call back with any new cardiac symptoms.

## 2020-02-07 ENCOUNTER — Telehealth: Payer: Self-pay | Admitting: Internal Medicine

## 2020-02-07 MED ORDER — CEPHALEXIN 500 MG PO CAPS: 500.0000 mg | ORAL_CAPSULE | Freq: Three times a day (TID) | ORAL | 0 refills | Status: AC

## 2020-02-07 NOTE — Telephone Encounter (Signed)
Called and spoke with patient, advised of Dr. Jane Canary recommendations.  She stated that she can take prednisone, it just makes her red and hot.  She can take a z-pack as well.  I advised her I would make Dr. Marchelle Gearing aware.  Verified pharmacy and script sent.

## 2020-02-07 NOTE — Telephone Encounter (Signed)
Definitely go get covid test PCR  She seems allergic to all abx and prednisone - so not sure what I can Rx her with. SHe can try cephalexin 500mg  three times daily x  5 days if she has had it in past without issue. This is for Acute Bronchtiis    Allergies  Allergen Reactions  . Bactrim [Sulfamethoxazole-Trimethoprim] Anaphylaxis and Other (See Comments)    Chest pain, trouble breathing  . Ciprofloxacin Palpitations  . Flagyl [Metronidazole] Shortness Of Breath  . Iodinated Diagnostic Agents Swelling  . Doxycycline Nausea And Vomiting  . Penicillins   . Prednisone Other (See Comments)    Makes her angry and irritable

## 2020-02-07 NOTE — Telephone Encounter (Signed)
Ok for z pak  And   Please take prednisone 40 mg x1 day, then 30 mg x1 day, then 20 mg x1 day, then 10 mg x1 day, and then 5 mg x1 day and stop - start this if abx not working

## 2020-02-07 NOTE — Telephone Encounter (Signed)
Called and spoke with patient who states she has symptoms of runny nose that started a few days ago and yesterday she started with a productive cough with green sputum. She said no fevers. Has not had any covid vaccines or flu shot.   Dr. Marchelle Gearing please advise

## 2020-02-08 ENCOUNTER — Other Ambulatory Visit (HOSPITAL_COMMUNITY): Admission: RE | Admit: 2020-02-08 | Payer: Medicaid Other | Source: Ambulatory Visit

## 2020-02-08 ENCOUNTER — Other Ambulatory Visit: Payer: Self-pay | Admitting: *Deleted

## 2020-02-08 DIAGNOSIS — R0602 Shortness of breath: Secondary | ICD-10-CM

## 2020-02-08 MED ORDER — PREDNISONE 10 MG PO TABS: ORAL_TABLET | ORAL | 0 refills | Status: DC

## 2020-02-08 MED ORDER — AZITHROMYCIN 250 MG PO TABS: ORAL_TABLET | ORAL | 0 refills | Status: AC

## 2020-02-08 NOTE — Telephone Encounter (Signed)
Spoke to patient and relayed below recommendations.  Rx for prednisone and zpak has been sent to preferred pharmacy. Patient aware to take prednisone if zpak is not working.  Prednisone is listed as a allergy(makes patient angry), however she stated that she has taken this previously with no reactions.  Nothing further needed.   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

## 2020-02-08 NOTE — Telephone Encounter (Signed)
Thanks

## 2020-03-04 ENCOUNTER — Other Ambulatory Visit: Payer: Self-pay | Admitting: Internal Medicine

## 2020-03-20 ENCOUNTER — Ambulatory Visit: Payer: Medicaid Other | Admitting: Nurse Practitioner

## 2020-03-28 ENCOUNTER — Other Ambulatory Visit: Payer: Self-pay | Admitting: Adult Health

## 2020-03-28 NOTE — Progress Notes (Shared)
[facility] Note  03/30/2020     CHIEF COMPLAINT Patient presents for No chief complaint on file.   HISTORY OF PRESENT ILLNESS: Emily [name] is a 39 y.o. female who presents to the clinic today for:     Referring physician: Harlene Salts, PA-C [place],  [place] [id number]  HISTORICAL INFORMATION:   Selected notes from the MEDICAL RECORD NUMBER Referred by Dr. [name] for concern of CME LEE:  Ocular Hx- PMH-    CURRENT MEDICATIONS: No current outpatient medications on file. (Ophthalmic Drugs)   No current facility-administered medications for this visit. (Ophthalmic Drugs)   Current Outpatient Medications (Other)  Medication Sig  . ACCU-CHEK FASTCLIX LANCETS MISC USE AS DIRECTED  . albuterol (VENTOLIN HFA) 108 (90 Base) MCG/ACT inhaler Inhale 2 puffs into the lungs every 4 (four) hours as needed.  Marland Kitchen amitriptyline (ELAVIL) 25 MG tablet Take 50 mg by mouth at bedtime.  Marland Kitchen amLODipine (NORVASC) 10 MG tablet Take 1 tablet (10 mg total) by mouth daily.  . Blood Glucose Monitoring Suppl (ACCU-CHEK AVIVA PLUS) w/Device KIT USE AS DIRECTED  . clonazePAM (KLONOPIN) 0.5 MG disintegrating tablet Take 0.5 mg by mouth daily as needed.  . insulin glargine (LANTUS SOLOSTAR) 100 UNIT/ML Solostar Pen Inject 26 Units into the skin at bedtime. Can increase to 40 units/day  . Insulin Pen Needle 32G X 4 MM MISC 1 each by Does not apply route as needed.  Marland Kitchen ipratropium-albuterol (DUONEB) 0.5-2.5 (3) MG/3ML SOLN Take 3 mLs by nebulization every 6 (six) hours as needed.  . lamoTRIgine (LAMICTAL) 150 MG tablet Take 150 mg by mouth daily.    Marland Kitchen levothyroxine (SYNTHROID, LEVOTHROID) 75 MCG tablet Take 1 tablet (75 mcg total) by mouth daily before breakfast.  . montelukast (SINGULAIR) 10 MG tablet Take 1 tablet (10 mg total) by mouth at bedtime.  . Nebulizers (COMPRESSOR/NEBULIZER) MISC Use as directed  . nitroGLYCERIN (NITROSTAT) 0.4 MG SL tablet Place 1  tablet (0.4 mg total) under the tongue every 5 (five) minutes as needed for chest pain.  Marland Kitchen omeprazole (PRILOSEC) 20 MG capsule Take 1 capsule (20 mg total) by mouth daily.  . paroxetine mesylate (PEXEVA) 20 MG tablet Take 20 mg by mouth daily.    Marland Kitchen PAXIL 20 MG tablet Take 20 mg by mouth daily.  . predniSONE (DELTASONE) 10 MG tablet 4tab x1d, 3tab x1d, 2tab x1d, 1tab x1d, 0.5tab x1d.  . propranolol (INDERAL) 20 MG tablet Take 1 tablet (20 mg total) by mouth 3 (three) times daily.  Marland Kitchen Respiratory Therapy Supplies (NEBULIZER/TUBING/MOUTHPIECE) KIT Use as directed   Current Facility-Administered Medications (Other)  Medication Route  . clindamycin (CLEOCIN) 2 % vaginal cream 1 Applicatorful Vaginal      REVIEW OF SYSTEMS:    ALLERGIES Allergies  Allergen Reactions  . Bactrim [Sulfamethoxazole-Trimethoprim] Anaphylaxis and Other (See Comments)    Chest pain, trouble breathing  . Ciprofloxacin Palpitations  . Flagyl [Metronidazole] Shortness Of Breath  . Iodinated Diagnostic Agents Swelling  . Doxycycline Nausea And Vomiting  . Penicillins   . Prednisone Other (See Comments)    Makes her angry and irritable    PAST MEDICAL HISTORY Past Medical History:  Diagnosis Date  . Abnormal CT of liver   . Anxiety   . Asthma   . Asthma   . Bipolar 1 disorder ([place])   . Bipolar disorder ([place])   . Chronic pain   . Depression   . Fatty liver   .  GERD (gastroesophageal reflux disease)   . Headache(784.0)   . Hyperlipidemia   . Hypertension   . Pulmonary nodule   . Tachycardia   . Thyroid disease    Past Surgical History:  Procedure Laterality Date  . DILATION AND CURETTAGE OF UTERUS     1 yr ago    FAMILY HISTORY Family History  Problem Relation Age of Onset  . Asthma Mother   . Diabetes Maternal Grandmother   . Hypertension Maternal Grandmother     SOCIAL HISTORY Social History   Tobacco Use  . Smoking status: Current Every Day Smoker    Packs/day: 1.00    Years:  19.00    Pack years: 19.00    Types: Cigarettes  . Smokeless tobacco: Never Used  Vaping Use  . Vaping Use: Never assessed  Substance Use Topics  . Alcohol use: No  . Drug use: No         OPHTHALMIC EXAM: Not recorded     IMAGING AND PROCEDURES  Imaging and Procedures for 03/30/2020           ASSESSMENT/PLAN:    ICD-10-CM   1. Retinal edema  H35.81 OCT, Retina - OU - Both Eyes    1.  2.  3.  Ophthalmic Meds Ordered this visit:  No orders of the defined types were placed in this encounter.      No follow-ups on file.  There are no Patient Instructions on file for this visit.   Explained the diagnoses, plan, and follow up with the patient and they expressed understanding.  Patient expressed understanding of the importance of proper follow up care.   This document serves as a record of services personally performed by Gardiner Sleeper, MD, PhD. It was created on their behalf by San Jetty. Owens Shark, OA an ophthalmic technician. The creation of this record is the provider's dictation and/or activities during the visit.    Electronically signed by: San Jetty. Marguerita Merles 12.08.2021 8:17 AM   Gardiner Sleeper, M.D., Ph.D. Diseases & Surgery of the Retina and Vitreous [facility]     Abbreviations: M myopia (nearsighted); A astigmatism; H hyperopia (farsighted); P presbyopia; Mrx spectacle prescription;  CTL contact lenses; OD right eye; OS left eye; OU both eyes  XT exotropia; ET esotropia; PEK punctate epithelial keratitis; PEE punctate epithelial erosions; DES dry eye syndrome; MGD meibomian gland dysfunction; ATs artificial tears; PFAT's preservative free artificial tears; Williams nuclear sclerotic cataract; PSC posterior subcapsular cataract; ERM epi-retinal membrane; PVD posterior vitreous detachment; RD retinal detachment; DM diabetes mellitus; DR diabetic retinopathy; NPDR non-proliferative diabetic retinopathy; PDR proliferative diabetic  retinopathy; CSME clinically significant macular edema; DME diabetic macular edema; dbh dot blot hemorrhages; CWS cotton wool spot; POAG primary open angle glaucoma; C/D cup-to-disc ratio; HVF humphrey visual field; GVF goldmann visual field; OCT optical coherence tomography; IOP intraocular pressure; BRVO Branch retinal vein occlusion; CRVO central retinal vein occlusion; CRAO central retinal artery occlusion; BRAO branch retinal artery occlusion; RT retinal tear; SB scleral buckle; PPV pars plana vitrectomy; VH Vitreous hemorrhage; PRP panretinal laser photocoagulation; IVK intravitreal kenalog; VMT vitreomacular traction; MH Macular hole;  NVD neovascularization of the disc; NVE neovascularization elsewhere; AREDS age related eye disease study; ARMD age related macular degeneration; POAG primary open angle glaucoma; EBMD epithelial/anterior basement membrane dystrophy; ACIOL anterior chamber intraocular lens; IOL intraocular lens; PCIOL posterior chamber intraocular lens; Phaco/IOL phacoemulsification with intraocular lens placement; PRK photorefractive keratectomy; LASIK laser assisted in situ keratomileusis; HTN  hypertension; DM diabetes mellitus; COPD chronic obstructive pulmonary disease

## 2020-03-30 ENCOUNTER — Encounter (INDEPENDENT_AMBULATORY_CARE_PROVIDER_SITE_OTHER): Payer: Medicaid Other | Admitting: Ophthalmology

## 2020-04-02 ENCOUNTER — Telehealth: Payer: Self-pay | Admitting: Pulmonary Disease

## 2020-04-02 DIAGNOSIS — N63 Unspecified lump in unspecified breast: Secondary | ICD-10-CM

## 2020-04-02 DIAGNOSIS — R599 Enlarged lymph nodes, unspecified: Secondary | ICD-10-CM

## 2020-04-02 NOTE — Telephone Encounter (Signed)
Patient sent mychart message stating her chest was hurting. Called the patient to address. Patient states she has had constant chest pain since last night than starts at her right breast and radiates to her right arm. Rates pain 3 out of 10. Patient has not taken any Nitroglycerin. Current BP 107/76. Denies SOB, N/V, sweating or any other symptoms at this time. Patient confirms she is still smoking. Family history of CAD. No recent cardiac cath or stress test to review.   Reviewed above with Dr. Elease Hashimoto who advised that patient go to the ED to be evaluated. Informed patient who verbalized understanding.

## 2020-04-02 NOTE — Telephone Encounter (Signed)
Called by Azure d/t her c/o R chest pain in her R breast above the breast and in the armpit. She is follow in pulmonary clinic by Dr. Marchelle Gearing. She has a PMH which includes DM, HT and COPD. She reports that the pain is made worse by exertion and the area is tender to touch. No c/o SOB, diaphoresis, redness, fever, chills, sweats or productive cough. She states that the pain started last evening and has gradually gotten worse over the course of the day. She reports no relief with Ibuprofen. This is a problem that can't be adequately diagnosed over the phone with a high degree of accuracy. DDx includes, but is not limited to muscular issues, pneumonia, soft tissue infection, cardiac ischemia and PE. I advised her to go to the Emergency Department where they could do a more complete evaluation. If she was unable to get to the Emergency Department tonight, I advised her to call the PCCM office in the AM to see if they could see her in the office tomorrow.

## 2020-04-03 NOTE — Telephone Encounter (Signed)
Called and spoke with patient who states that she no longer has a PCP. States that everyone from that office left and the office closed and they were never told anything. States that she is concerned about what she was told in the ED and is concerned about waiting to establish with a new PCP  Dr. Marchelle Gearing please advise

## 2020-04-03 NOTE — Telephone Encounter (Signed)
Called and spoke to pt. Pt states she went to an emergency department in [place] on 12.13.21. She was told she needs to have a diagnostic mammogram as she has enlarged axillary lymph nodes and enlarged lymph nodes near her breast. Pt called her GYN and was advised they cannot order a mammogram until she gets a referral, then GYN can order the mammogram. Pt denied any pulmonary s/s.    Dr. Marchelle Gearing, please advise if you are ok ordering a mammogram as GYN will not without a referral. Thanks.

## 2020-04-03 NOTE — Telephone Encounter (Signed)
   Hess, Lanice Shirts, PA-C -is listed as a primary care physician.  She needs to go to the PCP for this /as her pulmonologist I should not be referring for mammogram.  Please find out why she cannot contact the PCP for this

## 2020-04-04 ENCOUNTER — Telehealth: Payer: Self-pay | Admitting: Internal Medicine

## 2020-04-04 NOTE — Telephone Encounter (Signed)
Ok to refer to GYN for breast nodule/axillary nodee but please tel her to get PCP ASAP

## 2020-04-04 NOTE — Telephone Encounter (Signed)
Dr. Marchelle Gearing please advise, patient is calling to see if this can be done since she does not have a PCP.

## 2020-04-04 NOTE — Telephone Encounter (Signed)
Please see encounter from Loletha Grayer on 04/02/20 in regards to this. Will close this encounter.

## 2020-04-04 NOTE — Telephone Encounter (Signed)
Called and spoke with patient to let her know that we are waiting on a response from Dr. Marchelle Gearing. She expressed understanding.

## 2020-04-05 ENCOUNTER — Telehealth: Payer: Self-pay | Admitting: Internal Medicine

## 2020-04-05 NOTE — Addendum Note (Signed)
Addended by: Sheran Luz on: 04/05/2020 11:29 AM   Modules accepted: Orders

## 2020-04-05 NOTE — Telephone Encounter (Signed)
Lmtcb for pt.  

## 2020-04-05 NOTE — Telephone Encounter (Signed)
See phone note from 12.13.21 from Dr. Dian Situ. Will close encounter.

## 2020-04-05 NOTE — Telephone Encounter (Signed)
Called and spoke to pt. Informed her of the recs per MR. Order placed. Pt verbalized understanding and denied any further questions or concerns at this time.   

## 2020-04-09 ENCOUNTER — Telehealth: Payer: Self-pay | Admitting: Cardiovascular Disease

## 2020-04-09 NOTE — Telephone Encounter (Signed)
Will route to Dr. Elease Hashimoto to review.  Pt had labs, CXR and EKG at the hospital.

## 2020-04-09 NOTE — Telephone Encounter (Signed)
Patient called and said she went to [facility] in [place] last week (12/14). She was hoping Dr. Elease Hashimoto could look at the results and give his recommendations

## 2020-04-09 NOTE — Telephone Encounter (Signed)
These will need to be reviewed by Cardiology or PCP as she has a message to them . Please let her know

## 2020-04-09 NOTE — Telephone Encounter (Signed)
Patient wants to know if her lab work is okay.  It is in care everywhere for [facility] on 04/03/20

## 2020-04-09 NOTE — Telephone Encounter (Signed)
There was no lab work in her chart.  There appeared to be some care everywhere notes but could not see her labs She was last seen in July 2021.  Needs a follow-up appointment with Dr. Marchelle Gearing when she comes in for that visit we can review her lab work

## 2020-04-09 NOTE — Telephone Encounter (Signed)
Please advise on patient mychart message  Did you see my blood work from where I went the the hospital on the 14th I just wanted yall to look at it I am sending to [facility] since she saw her last and the Dr. Marchelle Gearing as her primary.

## 2020-04-09 NOTE — Telephone Encounter (Signed)
Patient wanted [name] Parrett to know that she went to [facility].

## 2020-04-10 ENCOUNTER — Ambulatory Visit: Payer: Medicaid Other | Attending: Family Medicine | Admitting: Physical Therapy

## 2020-04-11 ENCOUNTER — Ambulatory Visit (INDEPENDENT_AMBULATORY_CARE_PROVIDER_SITE_OTHER): Payer: Medicaid Other | Admitting: Ophthalmology

## 2020-04-11 ENCOUNTER — Other Ambulatory Visit: Payer: Self-pay

## 2020-04-11 DIAGNOSIS — H3581 Retinal edema: Secondary | ICD-10-CM

## 2020-04-11 DIAGNOSIS — I10 Essential (primary) hypertension: Secondary | ICD-10-CM | POA: Diagnosis not present

## 2020-04-11 DIAGNOSIS — H538 Other visual disturbances: Secondary | ICD-10-CM

## 2020-04-11 DIAGNOSIS — H35033 Hypertensive retinopathy, bilateral: Secondary | ICD-10-CM

## 2020-04-11 DIAGNOSIS — E119 Type 2 diabetes mellitus without complications: Secondary | ICD-10-CM

## 2020-04-11 DIAGNOSIS — H2512 Age-related nuclear cataract, left eye: Secondary | ICD-10-CM

## 2020-04-11 NOTE — Progress Notes (Signed)
[facility] Note  04/11/2020     CHIEF COMPLAINT Patient presents for Retina Evaluation   HISTORY OF PRESENT ILLNESS: Emily [name] is a 39 y.o. female who presents to the clinic today for:   HPI    Retina Evaluation    In left eye.  This started weeks ago.  Context:  distance vision and near vision.  I, the attending physician,  performed the HPI with the patient and updated documentation appropriately.          Comments    Retina eval per Dr. [name].  Patient has noticed blurred vision OS x a few weeks.  Dr. [name] told her she had swelling in the eye.   BS 211 yesterday, Monday 379 A1C went from 11 to 7       Last edited by [name], MD on 04/12/2020  1:02 AM. (History)    pt is here on the referral of Dr. [name] for decreased VA OS, pt states her left eye has been hurting and more blurry than usual, she states Dr. 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 [name] -- r/o macular edema LEE:  Ocular Hx- PMH-    CURRENT MEDICATIONS: No current outpatient medications on file. (Ophthalmic Drugs)   No current facility-administered medications for this visit. (Ophthalmic Drugs)   Current Outpatient Medications (Other)  Medication Sig   albuterol (VENTOLIN HFA) 108 (90 Base) MCG/ACT inhaler Inhale 2 puffs into the lungs every 4 (four) hours as needed.   amitriptyline (ELAVIL) 25 MG tablet Take 50 mg by mouth at bedtime.   amLODipine (NORVASC) 10 MG tablet Take 1 tablet (10 mg total) by mouth daily.   clonazePAM (KLONOPIN) 0.5 MG disintegrating tablet Take 0.5 mg by mouth daily as needed.   insulin glargine (LANTUS  SOLOSTAR) 100 UNIT/ML Solostar Pen Inject 26 Units into the skin at bedtime. Can increase to 40 units/day   Insulin Pen Needle 32G X 4 MM MISC 1 each by Does not apply route as needed.   ipratropium-albuterol (DUONEB) 0.5-2.5 (3) MG/3ML SOLN TAKE 3 MLS BY NEBULIZATION EVERY 6 (SIX) HOURS AS NEEDED.   lamoTRIgine (LAMICTAL) 150 MG tablet Take 150 mg by mouth daily.   levothyroxine (SYNTHROID, LEVOTHROID) 75 MCG tablet Take 1 tablet (75 mcg total) by mouth daily before breakfast.   montelukast (SINGULAIR) 10 MG tablet Take 1 tablet (10 mg total) by mouth at bedtime.   Nebulizers (COMPRESSOR/NEBULIZER) MISC Use as directed   nitroGLYCERIN (NITROSTAT) 0.4 MG SL tablet Place 1 tablet (0.4 mg total) under the tongue every 5 (five) minutes as needed for chest pain.   omeprazole (PRILOSEC) 20 MG capsule Take 1 capsule (20 mg total) by mouth daily.   paroxetine mesylate (PEXEVA) 20 MG tablet Take 20 mg by mouth daily.   PAXIL 20 MG tablet Take 20 mg by mouth daily.   predniSONE (DELTASONE) 10 MG tablet 4tab x1d, 3tab x1d, 2tab x1d, 1tab x1d, 0.5tab x1d.   propranolol (INDERAL) 20 MG tablet Take 1 tablet (20 mg total) by mouth 3 (three) times daily.   Respiratory Therapy Supplies (NEBULIZER/TUBING/MOUTHPIECE) KIT  Use as directed   ACCU-CHEK FASTCLIX LANCETS MISC USE AS DIRECTED   Blood Glucose Monitoring Suppl (ACCU-CHEK AVIVA PLUS) w/Device KIT USE AS DIRECTED   Current Facility-Administered Medications (Other)  Medication Route   clindamycin (CLEOCIN) 2 % vaginal cream 1 Applicatorful Vaginal      REVIEW OF SYSTEMS: ROS    Positive for: Gastrointestinal, HENT, Endocrine, Respiratory, Psychiatric   Negative for: Constitutional, Neurological, Skin, Genitourinary, Musculoskeletal, Cardiovascular, Eyes, Allergic/Imm, Heme/Lymph   Last edited by [name], COA on 04/11/2020  1:52 PM. (History)       ALLERGIES Allergies  Allergen Reactions   Bactrim  [Sulfamethoxazole-Trimethoprim] Anaphylaxis and Other (See Comments)    Chest pain, trouble breathing   Ciprofloxacin Palpitations   Flagyl [Metronidazole] Shortness Of Breath   Iodinated Diagnostic Agents Swelling   Doxycycline Nausea And Vomiting   Penicillins    Prednisone Other (See Comments)    Makes her angry and irritable    PAST MEDICAL HISTORY Past Medical History:  Diagnosis Date   Abnormal CT of liver    Anxiety    Asthma    Asthma    Bipolar 1 disorder (HCC)    Bipolar disorder (HCC)    Chronic pain    Depression    Fatty liver    GERD (gastroesophageal reflux disease)    Headache(784.0)    Hyperlipidemia    Hypertension    Pulmonary nodule    Tachycardia    Thyroid disease    Past Surgical History:  Procedure Laterality Date   DILATION AND CURETTAGE OF UTERUS     1 yr ago    FAMILY HISTORY Family History  Problem Relation Age of Onset   Asthma Mother    Diabetes Maternal Grandmother    Hypertension Maternal Grandmother     SOCIAL HISTORY Social History   Tobacco Use   Smoking status: Current Every Day Smoker    Packs/day: 1.00    Years: 19.00    Pack years: 19.00    Types: Cigarettes   Smokeless tobacco: Never Used  Substance Use Topics   Alcohol use: No   Drug use: No         OPHTHALMIC EXAM:  Base Eye Exam    Visual Acuity (Snellen - Linear)      Right Left   Dist [place] 20/25 20/50   Dist ph [place] NI 20/40       Tonometry (Tonopen, 2:03 PM)      Right Left   Pressure 20 16       Pupils      Dark Light Shape React APD   Right 6 4 Round Brisk None   Left 6 4 Round Brisk None       Visual Fields (Counting fingers)      Left Right    Full Full       Extraocular Movement      Right Left    Full Full       Neuro/Psych    Oriented x3: Yes   Mood/Affect: Normal       Dilation    Both eyes: 1.0% Mydriacyl, 2.5% Phenylephrine @ 2:03 PM        Slit Lamp and Fundus Exam    Slit Lamp Exam       Right Left   Lids/Lashes Dermatochalasis - upper lid Dermatochalasis - upper lid   Conjunctiva/Sclera White and quiet White and quiet   Cornea 1+ Punctate epithelial erosions 1+ Punctate epithelial erosions   Anterior  Chamber Deep and quiet Deep and quiet, no cell   Iris Round and dilated, No NVI Round and dilated, No NVI   Lens Clear 1-2+ Nuclear sclerosis, 1-2+ Cortical cataract   Vitreous Vitreous syneresis Vitreous syneresis       Fundus Exam      Right Left   Disc Compact, Pink and Sharp Compact, Pink and Sharp   C/D Ratio 0.1 0.1   Macula Flat, Blunted foveal reflex, No heme or edema Flat, good foveal reflex, mild RPE mottling, No heme or edema   Vessels attenuated, Tortuous, mild Copper wiring, mild AV crossing changes attenuated, Tortuous, mild Copper wiring, mild AV crossing changes   Periphery Attached    Attached, focal punctate pigment clump at 0115           Refraction    Manifest Refraction      Sphere Cylinder Axis Dist VA   Right -0.50 Sphere  20/25   Left -1.00 +0.50 075 20/40          IMAGING AND PROCEDURES  Imaging and Procedures for 04/11/2020  OCT, Retina - OU - Both Eyes       Right Eye Quality was good. Central Foveal Thickness: 236. Progression has no prior data. Findings include normal foveal contour, no IRF, no SRF, vitreomacular adhesion .   Left Eye Quality was good. Central Foveal Thickness: 233. Progression has no prior data. Findings include normal foveal contour, no IRF, no SRF, vitreomacular adhesion .   Notes *Images captured and stored on drive  Diagnosis / Impression:  NFP, no IRF/SRF OU  Clinical management:  See below  Abbreviations: NFP - Normal foveal profile. CME - cystoid macular edema. PED - pigment epithelial detachment. IRF - intraretinal fluid. SRF - subretinal fluid. EZ - ellipsoid zone. ERM - epiretinal membrane. ORA - outer retinal atrophy. ORT - outer retinal tubulation. SRHM - subretinal hyper-reflective  material. IRHM - intraretinal hyper-reflective material                 ASSESSMENT/PLAN:    ICD-10-CM   1. Blurred vision, left eye  H53.8   2. Diabetes mellitus type 2 without retinopathy (HCC)  E11.9   3. Retinal edema  H35.81 OCT, Retina - OU - Both Eyes  4. Essential hypertension  I10   5. Hypertensive retinopathy of both eyes  H35.033   6. Nuclear sclerosis of left eye  H25.12    1. Blurred Vision OS  - pt reports gradual decrease in vision OS over the last several weeks  - BCVA 20/40  - no retinal edema noted exam or OCT  - OS with mild cataract -- only significant abnormality noted on exam to explain visual symptoms  - discussed findings  - no retinal intervention recommended at this time  - f/u here prn  2,3. Diabetes mellitus, type 2 without retinopathy - The incidence, risk factors for progression, natural history and treatment options for diabetic retinopathy  were discussed with patient.   - The need for close monitoring of blood glucose, blood pressure, and serum lipids, avoiding cigarette or any type of tobacco, and the need for long term follow up was also discussed with patient. - f/u in 1 year, sooner prn  4,5. Hypertensive retinopathy OU - discussed importance of tight BP control - monitor  6. Nuclear Sclerosis OS - The symptoms of cataract, surgical options, and treatments and risks were discussed with patient. - discussed diagnosis and progression - monitor   Ophthalmic Meds Ordered  this visit:  No orders of the defined types were placed in this encounter.      Return if symptoms worsen or fail to improve.  There are no Patient Instructions on file for this visit.   Explained the diagnoses, plan, and follow up with the patient and they expressed understanding.  Patient expressed understanding of the importance of proper follow up care.   This document serves as a record of services personally performed by Gardiner Sleeper, MD, PhD. It was  created on their behalf by San Jetty. Owens Shark, OA an ophthalmic technician. The creation of this record is the provider's dictation and/or activities during the visit.    Electronically signed by: San Jetty. Owens Shark, [place] 12.22.2021 1:07 AM   Gardiner Sleeper, M.D., Ph.D. Diseases & Surgery of the Retina and Vitreous [facility]  I have reviewed the above documentation for accuracy and completeness, and I agree with the above. Gardiner Sleeper, M.D., Ph.D. 04/12/20 1:07 AM     Abbreviations: M myopia (nearsighted); A astigmatism; H hyperopia (farsighted); P presbyopia; Mrx spectacle prescription;  CTL contact lenses; OD right eye; OS left eye; OU both eyes  XT exotropia; ET esotropia; PEK punctate epithelial keratitis; PEE punctate epithelial erosions; DES dry eye syndrome; MGD meibomian gland dysfunction; ATs artificial tears; PFAT's preservative free artificial tears; [place] nuclear sclerotic cataract; PSC posterior subcapsular cataract; ERM epi-retinal membrane; PVD posterior vitreous detachment; RD retinal detachment; DM diabetes mellitus; DR diabetic retinopathy; NPDR non-proliferative diabetic retinopathy; PDR proliferative diabetic retinopathy; CSME clinically significant macular edema; DME diabetic macular edema; dbh dot blot hemorrhages; CWS cotton wool spot; POAG primary open angle glaucoma; C/D cup-to-disc ratio; HVF humphrey visual field; GVF goldmann visual field; OCT optical coherence tomography; IOP intraocular pressure; BRVO Branch retinal vein occlusion; CRVO central retinal vein occlusion; CRAO central retinal artery occlusion; BRAO branch retinal artery occlusion; RT retinal tear; SB scleral buckle; PPV pars plana vitrectomy; VH Vitreous hemorrhage; PRP panretinal laser photocoagulation; IVK intravitreal kenalog; VMT vitreomacular traction; MH Macular hole;  NVD neovascularization of the disc; NVE neovascularization elsewhere; AREDS age related eye disease study; ARMD age  related macular degeneration; POAG primary open angle glaucoma; EBMD epithelial/anterior basement membrane dystrophy; ACIOL anterior chamber intraocular lens; IOL intraocular lens; PCIOL posterior chamber intraocular lens; Phaco/IOL phacoemulsification with intraocular lens placement; [place] photorefractive keratectomy; LASIK laser assisted in situ keratomileusis; HTN hypertension; DM diabetes mellitus; COPD chronic obstructive pulmonary disease

## 2020-04-12 ENCOUNTER — Encounter (INDEPENDENT_AMBULATORY_CARE_PROVIDER_SITE_OTHER): Payer: Self-pay | Admitting: Ophthalmology

## 2020-04-13 ENCOUNTER — Other Ambulatory Visit: Payer: Self-pay | Admitting: Internal Medicine

## 2020-04-16 NOTE — Telephone Encounter (Signed)
Sent message to patient advising that hospital records do not indicate a cardiac reason for her symptoms. I advised that she may schedule an appointment with Dr. Elease Hashimoto for further discussion.

## 2020-05-03 ENCOUNTER — Ambulatory Visit: Payer: Medicaid Other | Attending: Family Medicine | Admitting: Physical Therapy

## 2020-05-31 NOTE — Telephone Encounter (Signed)
Please advise on patient mychart message  When I cough I am having spasms or fluttering under my chest my boobs it's feels strange most of the time. This has been going on About a week my breathing ain't the best but I'm not short of breath or pain just feels really strange

## 2020-06-01 NOTE — Telephone Encounter (Signed)
Tried to call patient , no answer .  IF she has CHest pain needs to go to ER for evaluation  Will send to Dr. Marchelle Gearing for [facility]

## 2020-06-01 NOTE — Telephone Encounter (Signed)
Continue to keep an eye on this.  If it does not get better she needs to go to the ER.  You can try her one more time and if it goes to voicemail disclose the message

## 2020-06-01 NOTE — Telephone Encounter (Signed)
Pt states she is calling back . That she had a missed call from [place]. Please advise [phone]

## 2020-06-01 NOTE — Telephone Encounter (Signed)
Tried calling the pt back and there was no answer- her mailbox was full, so unable to leave msg, WCB.

## 2020-06-01 NOTE — Telephone Encounter (Signed)
Called and spoke with pt letting her know the info stated by MR and she verbalized understanding. Nothing further needed. 

## 2020-06-04 ENCOUNTER — Ambulatory Visit: Payer: Medicaid Other | Admitting: Cardiovascular Disease

## 2020-06-11 NOTE — Telephone Encounter (Signed)
Called pt again and still no answer and no VM This makes 2 attempts today to call pt as well as sending her email to call us back Will try calling again 06/12/20

## 2020-06-11 NOTE — Telephone Encounter (Signed)
Tried calling the pt to discuss symptoms- there was no answer and her VM was full, WCB.

## 2020-06-12 ENCOUNTER — Other Ambulatory Visit: Payer: Self-pay | Admitting: Adult Health

## 2020-06-12 NOTE — Telephone Encounter (Signed)
ATC patient unable to reach LM to call back office (x3) will close message per triage protocol

## 2020-06-25 ENCOUNTER — Telehealth: Payer: Self-pay | Admitting: Cardiovascular Disease

## 2020-06-25 NOTE — Telephone Encounter (Signed)
Attempted to call patient, voice mail is full so unable to leave message.

## 2020-06-25 NOTE — Telephone Encounter (Signed)
Patient c/o Palpitations:  High priority if patient c/o lightheadedness, shortness of breath, or chest pain  1) How long have you had palpitations/irregular HR/ Afib? Are you having the symptoms now? 4 hours Friday, no  2) Are you currently experiencing lightheadedness, SOB or CP? no  3) Do you have a history of afib (atrial fibrillation) or irregular heart rhythm?  tachycardia   4) Have you checked your BP or HR? (document readings if available): no  5) Are you experiencing any other symptoms? A little fluttering  Patient states on Friday she woke up and her heart was out of rhythm. She states she also started an antibiotic the day before. She states it lasted for 4 hours and that she is only having a little bit of fluttering now.

## 2020-06-28 ENCOUNTER — Telehealth: Payer: Self-pay | Admitting: Internal Medicine

## 2020-06-28 DIAGNOSIS — J4520 Mild intermittent asthma, uncomplicated: Secondary | ICD-10-CM

## 2020-06-28 MED ORDER — IPRATROPIUM-ALBUTEROL 0.5-2.5 (3) MG/3ML IN SOLN: 3.0000 mL | Freq: Four times a day (QID) | RESPIRATORY_TRACT | 3 refills | Status: DC | PRN

## 2020-06-28 NOTE — Telephone Encounter (Signed)
Per pt had 1 episode of heart feeling out of rhythm for about 4 hours no other symptoms Per pt had taken 2 of the Propanolol and episode resolved.Patient will continue to monitor and decrease caffeine intake Appt made with Dr Elease Hashimoto for 08/03/20 at 2:40 pm .Will forward to Dr Elease Hashimoto for review./cy

## 2020-06-28 NOTE — Telephone Encounter (Signed)
ordered

## 2020-06-28 NOTE — Telephone Encounter (Signed)
ATC patient to let her know new order has been placed, per DPR left detailed message. Nothing further needed at this time.

## 2020-06-28 NOTE — Telephone Encounter (Signed)
Call returned to patient, confirmed DOB. Patient requesting a new order for a nebulizer machine as the prongs on the cord are bent and no longer work.   MR please advise if okay to send order for new nebulizer machine. Thanks :)

## 2020-07-04 ENCOUNTER — Telehealth: Payer: Self-pay | Admitting: Internal Medicine

## 2020-07-04 NOTE — Telephone Encounter (Signed)
Called and spoke with patient, advised of recommendations per Dr. Celine Mans.  Appointment scheduled with Rubye Oaks NP.  Patient advised to arrive at 3:15 pm for a 3:30 appointment for check in.  Nothing further needed.

## 2020-07-04 NOTE — Telephone Encounter (Signed)
Recommend putting her on for an acute visit tomorrow with APP. And suggest she stop or cut back on smoking. That is the most likely cause. She can also use her duoneb treatments or albuterol inhaler more often.

## 2020-07-04 NOTE — Telephone Encounter (Signed)
Called and spoke with patient, she states the pain in her mid upper back down started 3 days ago. She is not experiencing any sob or any other symptoms. She started a new insulin and is not sure if it is her kidneys or her lungs.  She is still smoking a pack of cigarette per day, she says today she smoked a 1/2 a pack in a few hours today and feels that may be the cause as well.  I asked if she had contacted the doctor's office that prescribed the insulin.  She states she messaged them, but has not heard back from them yet.  I advised her that Dr. Marchelle Gearing is out of the office for 2 weeks and that [name] Parrett NP is not in the office this afternoon, I would send this to our doctor of the day and contact her back after we heard back from them.  Dr. Celine Mans, Please advise on any recommendations.  Thank you.

## 2020-07-05 ENCOUNTER — Encounter: Payer: Self-pay | Admitting: Adult Health

## 2020-07-05 ENCOUNTER — Ambulatory Visit (INDEPENDENT_AMBULATORY_CARE_PROVIDER_SITE_OTHER): Payer: Medicaid Other

## 2020-07-05 ENCOUNTER — Ambulatory Visit (INDEPENDENT_AMBULATORY_CARE_PROVIDER_SITE_OTHER): Payer: Medicaid Other | Admitting: Adult Health

## 2020-07-05 ENCOUNTER — Other Ambulatory Visit: Payer: Self-pay

## 2020-07-05 VITALS — BP 138/86 | HR 91 | Temp 97.9°F | Ht 63.0 in | Wt 216.0 lb

## 2020-07-05 DIAGNOSIS — J42 Unspecified chronic bronchitis: Secondary | ICD-10-CM

## 2020-07-05 DIAGNOSIS — R0789 Other chest pain: Secondary | ICD-10-CM | POA: Diagnosis not present

## 2020-07-05 DIAGNOSIS — F17200 Nicotine dependence, unspecified, uncomplicated: Secondary | ICD-10-CM

## 2020-07-05 NOTE — Progress Notes (Signed)
_0  ID: Emily [name], female    DOB: Jul 23, 1980, 40 y.o.   MRN: [id number]  Chief Complaint  Patient presents with  . Follow-up    Referring provider: Cannon Kettle  HPI: 40 year old female active smoker 1.5 packs/day followed for chronic rhinitis and chronic bronchitis Medical history significant for diabetes, Bipolar disorder ,Panic disorder   TEST/EVENTS :  2020  Ace - 81 Esr - 30 Rh<14 CCP<16 ANCA - negative Hypersensitive activity pneumonitis panel negative Anti-Jo 1 less than 0.2 RNP antibodies greater than 8 Sjogren's syndrome antibodies negative Anti-scleroderma antibody less than 1 Aldolase 4.5 MPO/PR-3 (ANCA) antibodies-less than 1 Total CK 47, CK-MB less than 7-negative   04/01/2018-CT chest high resolution-few patchy areas of mild groundglass attenuation in the lungs bilaterally nonspecific, stable pulmonary nodules from 2009   01/08/2015-spirometry-FVC 1.33 (38% predicted), ratio 72, FEV1 1.01 (37% predicted)  07/05/2020 Follow up : Chronic Bronchitis  Patient presents for a follow up visit . Last visit (telemedicine ) was 10/2019 . Says overall she has been doing well , feels some better. Taking Duoneb Three times a day  .  Continues to smoke 1.5 packs of cigarettes daily smoking cessation discussed She has chronic depression, has been doing better on current regimen.  Denies change in activity tolerance.  No increased cough . No recent antibiotic use.   Allergies  Allergen Reactions  . Bactrim [Sulfamethoxazole-Trimethoprim] Anaphylaxis and Other (See Comments)    Chest pain, trouble breathing  . Ciprofloxacin Palpitations  . Flagyl [Metronidazole] Shortness Of Breath  . Iodinated Diagnostic Agents Swelling  . Doxycycline Nausea And Vomiting  . Penicillins   . Prednisone Other (See Comments)    Makes her angry and irritable    Immunization History  Administered Date(s) Administered  . Influenza Split 03/22/2014  .  Influenza Whole 01/13/2008  . Influenza, Seasonal, Injecte, Preservative Fre 04/18/2015  . Influenza,inj,Quad PF,6+ Mos 04/18/2015  . PFIZER(Purple Top)SARS-COV-2 Vaccination 03/12/2020, 04/16/2020  . Pneumococcal Polysaccharide-23 05/10/2012  . Tdap 01/02/2006, 08/08/2015    Past Medical History:  Diagnosis Date  . Abnormal CT of liver   . Anxiety   . Asthma   . Asthma   . Bipolar 1 disorder ([place])   . Bipolar disorder ([place])   . Chronic pain   . Depression   . Fatty liver   . GERD (gastroesophageal reflux disease)   . Headache(784.0)   . Hyperlipidemia   . Hypertension   . Pulmonary nodule   . Tachycardia   . Thyroid disease     Tobacco History: Social History   Tobacco Use  Smoking Status Current Every Day Smoker  . Packs/day: 1.00  . Years: 20.00  . Pack years: 20.00  . Types: Cigarettes  Smokeless Tobacco Never Used   Ready to quit: Not Answered Counseling given: Not Answered   Outpatient Medications Prior to Visit  Medication Sig Dispense Refill  . ACCU-CHEK AVIVA PLUS test strip 1 each by Other route 3 (three) times daily.    Marland Kitchen ACCU-CHEK FASTCLIX LANCETS MISC USE AS DIRECTED 102 each 3  . albuterol (VENTOLIN HFA) 108 (90 Base) MCG/ACT inhaler Inhale 2 puffs into the lungs every 4 (four) hours as needed. 18 g 3  . amitriptyline (ELAVIL) 25 MG tablet Take 50 mg by mouth at bedtime.    Marland Kitchen amLODipine (NORVASC) 10 MG tablet Take 1 tablet (10 mg total) by mouth daily. 90 tablet 3  . benztropine (COGENTIN) 1 MG tablet Take 1 mg by mouth 2 (two)  times daily.    . Blood Glucose Monitoring Suppl (ACCU-CHEK AVIVA PLUS) w/Device KIT USE AS DIRECTED 1 kit 0  . clonazePAM (KLONOPIN) 0.5 MG disintegrating tablet Take 0.5 mg by mouth daily as needed.    . insulin glargine (LANTUS SOLOSTAR) 100 UNIT/ML Solostar Pen Inject 26 Units into the skin at bedtime. Can increase to 40 units/day    . Insulin Lispro Prot & Lispro (HUMALOG 75/25 MIX) (75-25) 100 UNIT/ML Kwikpen Inject 24  Units into the skin 2 (two) times daily.    . Insulin Pen Needle 32G X 4 MM MISC 1 each by Does not apply route as needed.    Marland Kitchen ipratropium-albuterol (DUONEB) 0.5-2.5 (3) MG/3ML SOLN Take 3 mLs by nebulization every 6 (six) hours as needed. 90 mL 3  . lamoTRIgine (LAMICTAL) 150 MG tablet Take 150 mg by mouth daily.    Marland Kitchen levothyroxine (SYNTHROID, LEVOTHROID) 75 MCG tablet Take 1 tablet (75 mcg total) by mouth daily before breakfast. 90 tablet 3  . montelukast (SINGULAIR) 10 MG tablet TAKE 1 TABLET BY MOUTH EVERYDAY AT BEDTIME 90 tablet 1  . Nebulizers (COMPRESSOR/NEBULIZER) MISC Use as directed 1 each 0  . nitroGLYCERIN (NITROSTAT) 0.4 MG SL tablet Place 1 tablet (0.4 mg total) under the tongue every 5 (five) minutes as needed for chest pain. 25 tablet 6  . omeprazole (PRILOSEC) 20 MG capsule Take 1 capsule (20 mg total) by mouth daily. 30 capsule 11  . paroxetine mesylate (PEXEVA) 20 MG tablet Take 20 mg by mouth daily.    Marland Kitchen PAXIL 20 MG tablet Take 20 mg by mouth daily.    . predniSONE (DELTASONE) 10 MG tablet 4tab x1d, 3tab x1d, 2tab x1d, 1tab x1d, 0.5tab x1d. 11 tablet 0  . propranolol (INDERAL) 20 MG tablet Take 1 tablet (20 mg total) by mouth 3 (three) times daily. 270 tablet 3  . Respiratory Therapy Supplies (NEBULIZER/TUBING/MOUTHPIECE) KIT Use as directed 10 kit 11  . ziprasidone (GEODON) 60 MG capsule Take 60 mg by mouth daily.     Facility-Administered Medications Prior to Visit  Medication Dose Route Frequency Provider Last Rate Last Admin  . clindamycin (CLEOCIN) 2 % vaginal cream 1 Applicatorful  1 Applicatorful Vaginal QHS Jonnie Kind, MD         Review of Systems:   Constitutional:   No  weight loss, night sweats,  Fevers, chills, + fatigue, or  lassitude.  HEENT:   No headaches,  Difficulty swallowing,  Tooth/dental problems, or  Sore throat,                No sneezing, itching, ear ache, nasal congestion, post nasal drip,   CV:  No chest pain,  Orthopnea, PND,  swelling in lower extremities, anasarca, dizziness, palpitations, syncope.   GI  No heartburn, indigestion, abdominal pain, nausea, vomiting, diarrhea, change in bowel habits, loss of appetite, bloody stools.   Resp:    No chest wall deformity  Skin: no rash or lesions.  GU: no dysuria, change in color of urine, no urgency or frequency.  No flank pain, no hematuria   MS:  No joint pain or swelling.  No decreased range of motion.  No back pain.    Physical Exam  BP 138/86 (BP Location: Left Arm, Cuff Size: Normal)   Pulse 91   Temp 97.9 F (36.6 C)   Ht _0  (1.6 m)   Wt 216 lb (98 kg)   SpO2 97%   BMI 38.26 kg/m  GEN: A/Ox3; pleasant , NAD, BMI 38    HEENT:  [place]/AT,  EACs-clear, TMs-wnl, NOSE-clear, THROAT-clear, no lesions, no postnasal drip or exudate noted.   NECK:  Supple w/ fair ROM; no JVD; normal carotid impulses w/o bruits; no thyromegaly or nodules palpated; no lymphadenopathy.    RESP  Clear  P & A; w/o, wheezes/ rales/ or rhonchi. no accessory muscle use, no dullness to percussion  CARD:  RRR, no m/r/g, no peripheral edema, pulses intact, no cyanosis or clubbing.  GI:   Soft & nt; nml bowel sounds; no organomegaly or masses detected.   Musco: Warm bil, no deformities or joint swelling noted.   Neuro: alert, no focal deficits noted.    Skin: Warm, no lesions or rashes    Lab Results:   BMET   BNP No results found for: BNP  ProBNP No results found for: PROBNP  Imaging: No results found.    No flowsheet data found.       Assessment & Plan:   No problem-specific Assessment & Plan notes found for this encounter.     Rexene Edison, NP 07/05/2020

## 2020-07-05 NOTE — Patient Instructions (Addendum)
Chest xray today .  Continue on Duoneb 2-3 times a day .  Activity as tolerated.  Mucinex DM Twice daily  As needed  Cough/cogestion .  Work on not smoking .  Follow up with Dr. Marchelle Gearing in 4 months with PFTs and As needed

## 2020-07-06 DIAGNOSIS — J42 Unspecified chronic bronchitis: Secondary | ICD-10-CM

## 2020-07-06 DIAGNOSIS — R0602 Shortness of breath: Secondary | ICD-10-CM

## 2020-07-06 DIAGNOSIS — R918 Other nonspecific abnormal finding of lung field: Secondary | ICD-10-CM

## 2020-07-06 DIAGNOSIS — R059 Cough, unspecified: Secondary | ICD-10-CM

## 2020-07-09 DIAGNOSIS — J42 Unspecified chronic bronchitis: Secondary | ICD-10-CM | POA: Insufficient documentation

## 2020-07-09 NOTE — Telephone Encounter (Signed)
Pt called in stating she would like to speak with a different nurse to explain results of Xray . She did not understand the results when the first Nurse called her .please advise    cb [phone]

## 2020-07-09 NOTE — Telephone Encounter (Signed)
Pt calling with questions about xray results. Pt can be reached at [phone]

## 2020-07-09 NOTE — Assessment & Plan Note (Signed)
Smoking cessation  

## 2020-07-09 NOTE — Telephone Encounter (Signed)
mychart message sent by pt:  To: [facility]    From: Emily [name]    Created: 07/09/2020 9:17 AM     *-*-*This message was handled on 07/09/2020 11:35 AM by Anjelina Dung P*-*-*  I got the results of the xray last night but I don't understand what it says I was wondering if you could explain it to me sorry for bothering yall       Pt also called the office as well requesting to know the results of the xray. [name], please advise.

## 2020-07-09 NOTE — Telephone Encounter (Signed)
Called and spoke with pt letting her know the results of the cxr and stated to her that we were going to order HRCT to further evaluate. Pt verbalized understanding. Order has been placed for HRCT. Nothing further needed.

## 2020-07-09 NOTE — Assessment & Plan Note (Signed)
Continue on Duoneb  Chest xray  PFT on return   Plan  Patient Instructions  Chest xray today .  Continue on Duoneb 2-3 times a day .  Activity as tolerated.  Mucinex DM Twice daily  As needed  Cough/cogestion .  Work on not smoking .  Follow up with Dr. Marchelle Gearing in 4 months with PFTs and As needed

## 2020-07-10 ENCOUNTER — Telehealth: Payer: Self-pay | Admitting: *Deleted

## 2020-07-10 NOTE — Telephone Encounter (Signed)
ATC patient to provide CXR results again.  No answer, left VM to return call.  When patient returns call, this is what the CXR showed per Rubye Oaks NP:

## 2020-07-10 NOTE — Telephone Encounter (Signed)
Patient has increased interstitial markings that can be due to chronic bronchitis, chronic smoking. There can also be increased markings from possible interstitial lung disease. Previous CT chest did show some increased interstitial markings.  Can set up high-resolution CT chest to further evaluate for possible underlying interstitial lung disease. She has pulmonary function testing plan for next visit. Continued goal is to quit smoking.

## 2020-07-10 NOTE — Telephone Encounter (Signed)
Patient is returning phone call. Patient phone number is [phone].

## 2020-07-10 NOTE — Telephone Encounter (Signed)
Pt returning call from office . No VM was left . Please advise    Call back [phone]

## 2020-07-10 NOTE — Telephone Encounter (Signed)
Called and spoke with patient advised of results/recommendation per Rubye Oaks NP.  Advised it was ok to wait until 4/14 to have the HR CT scan and that it would give more information about what is going on.  She verbalized understanding.  Nothing further needed.

## 2020-07-12 NOTE — Telephone Encounter (Signed)
No that will be fine.

## 2020-07-15 ENCOUNTER — Other Ambulatory Visit: Payer: Self-pay | Admitting: Cardiovascular Disease

## 2020-07-18 NOTE — Telephone Encounter (Signed)
Patient would like to see if she can be scheduled for a sooner CT scan and is willing to drive to [facility] to get it done.   Please advise

## 2020-07-19 NOTE — Telephone Encounter (Signed)
I have rescheduled pt to 4/6 at 1:30, arrive 1:15 at [facility].  Will route to triage so they can make pt aware thru MyChart.

## 2020-07-19 NOTE — Telephone Encounter (Signed)
Emily [name] the pt stated that she will need the CT scan done in [place].  She does not go to [facility].

## 2020-07-19 NOTE — Telephone Encounter (Signed)
I have rescheduled pt's CT to 4/5 at 2:00, arrive 1:45 at the [facility] in [facility] - [address]  Will route message back to triage so they can make pt aware thru MyChart.

## 2020-07-24 ENCOUNTER — Ambulatory Visit (HOSPITAL_BASED_OUTPATIENT_CLINIC_OR_DEPARTMENT_OTHER): Payer: Medicaid Other

## 2020-07-25 ENCOUNTER — Ambulatory Visit (HOSPITAL_COMMUNITY)
Admission: RE | Admit: 2020-07-25 | Discharge: 2020-07-25 | Disposition: A | Discharge status: Home / Self Care | Payer: Medicaid Other | Source: Ambulatory Visit | Attending: Adult Health | Admitting: Adult Health

## 2020-07-25 ENCOUNTER — Encounter (HOSPITAL_COMMUNITY): Payer: Self-pay

## 2020-07-25 ENCOUNTER — Other Ambulatory Visit: Payer: Self-pay

## 2020-07-25 DIAGNOSIS — J42 Unspecified chronic bronchitis: Secondary | ICD-10-CM | POA: Diagnosis present

## 2020-07-25 DIAGNOSIS — R918 Other nonspecific abnormal finding of lung field: Secondary | ICD-10-CM | POA: Insufficient documentation

## 2020-07-25 DIAGNOSIS — R059 Cough, unspecified: Secondary | ICD-10-CM

## 2020-07-25 DIAGNOSIS — R0602 Shortness of breath: Secondary | ICD-10-CM | POA: Insufficient documentation

## 2020-07-27 NOTE — Telephone Encounter (Signed)
Please see CT results.

## 2020-07-27 NOTE — Telephone Encounter (Signed)
Message received from Patient requesting CT results.   I got the CT scan results but I don't really understand it can you tell me what it says please    Message routed to [facility], NP to advise

## 2020-07-30 ENCOUNTER — Telehealth: Payer: Self-pay | Admitting: Adult Health

## 2020-07-30 NOTE — Progress Notes (Signed)
Called and spoke with patient.  Provided results/recommendations per Rubye Oaks NP.  She verbalized understanding.  Nothing further needed.

## 2020-07-30 NOTE — Telephone Encounter (Signed)
See results note. 

## 2020-07-30 NOTE — Progress Notes (Signed)
ATC x1.  Left message to return call. 

## 2020-08-02 ENCOUNTER — Other Ambulatory Visit (HOSPITAL_COMMUNITY): Payer: Medicaid Other

## 2020-08-03 ENCOUNTER — Encounter: Payer: Self-pay | Admitting: Cardiovascular Disease

## 2020-08-03 ENCOUNTER — Other Ambulatory Visit: Payer: Self-pay

## 2020-08-03 ENCOUNTER — Ambulatory Visit: Payer: Medicaid Other | Admitting: Cardiovascular Disease

## 2020-08-03 DIAGNOSIS — R072 Precordial pain: Secondary | ICD-10-CM | POA: Diagnosis not present

## 2020-08-03 MED ORDER — FENOFIBRATE 145 MG PO TABS: 145.0000 mg | ORAL_TABLET | Freq: Every day | ORAL | 3 refills | Status: DC

## 2020-08-03 MED ORDER — ROSUVASTATIN CALCIUM 10 MG PO TABS: 10.0000 mg | ORAL_TABLET | Freq: Every day | ORAL | 3 refills | Status: DC

## 2020-08-03 NOTE — Patient Instructions (Signed)
Medication Instructions:  Your physician has recommended you make the following change in your medication:   START Rosuvastatin 10mg  daily START Fenofibrate 145mg  daily  *If you need a refill on your cardiac medications before your next appointment, please call your pharmacy*   Lab Work: BMET, LIPIDS, ALT Your physician recommends that you return for lab work in: 3 months  You will need to FAST for this appointment - nothing to eat or drink after midnight the night before except water.     Testing/Procedures: Your physician has requested that you have a lexiscan myoview. For further information please visit . Please follow instruction sheet, as given.    Follow-Up: At [facility], you and your health needs are our priority.  As part of our continuing mission to provide you with exceptional heart care, we have created designated Provider Care Teams.  These Care Teams include your primary Cardiologist (physician) and Advanced Practice Providers (APPs -  Physician Assistants and Nurse Practitioners) who all work together to provide you with the care you need, when you need it.  Your next appointment:   3-4 month(s)  The format for your next appointment:   In Person  Provider:   You will see one of the following Advanced Practice Providers on your designated Care Team:    [email or web address]  Vin Foreston, Tereso Newcomer

## 2020-08-03 NOTE — Progress Notes (Signed)
Cardiology Office Note:    Date:  08/03/2020   ID:  Emily QUIZHPI, DOB [birth year], MRN [id number]  PCP:  Harlene Salts, PA-C  Cardiologist:  Mertie Moores, MD  Electrophysiologist:  None   Problem list 1.  Coronary artery calcifications 2.  Hyperlipidemia 3.  Hypertension 4.  Hypothyroidism 5.  Diabetes mellitus  Referring MD: Harlene Salts, PA-C   Chief Complaint  Patient presents with  . Shortness of Breath  . Hyperlipidemia     Feb. 11, 2020    Emily [name] is a 40 y.o. female with a hx of hypertension and hyperlipidemia.   Was seen with her sister Emily [name].   She recently had a CT scan of the lungs and was incidentally found to have coronary artery calcifications. We are asked to see her today by Harlene Salts, PA-C for further evaluations of these coronary artery calcifications   [place]th had a CT scan for further evaluation of some shortness of breath.  The shortness of breath seems to be worsening over the past several months.  She was incidentally found to have coronary artery calcifications.  Has occasional chest tightness/ pressure like sensation  with exertion.     She has these episodes of tightness whenever she does anything exertional such as climbing stairs or walking from the parking lot into a store. The pain is described as a pressure-like pain or a tightness.  It last for as long as she is walking and then resolves after several minutes after she stops walking.  She is never had any prolonged episodes.  Does not do any regular exercise   Eats lots of junk food.    Does not pay attention to her diet Goes to [employer] lots.    Eats late at night  is not working  - trying to get disability .    + family hx of CAD  - mother and father both had cad   March 05, 2019:  Emily [name]  seen today for follow-up visit.  I saw her earlier this year.  She had a history of coronary artery calcifications and had symptoms that were worrisome for unstable  angina.  She has a strong family history of coronary disease and has multiple risk factors including hyperlipidemia and cigarette smoking.  I saw her in Jan.   For angina . Has lots of CP with anxiety. No exercise per se.  Complains of lung issues.   Still smoking .  She is not having any further episodes of angina.  She admits that she is very stressed out.  Complains of 4 dimples in her leg.  She complains of leg swelling.  She admits that she eats salty foods.  She likes soup.  Still eats some salty foods.   Sees pulmonary   November 28, 2019:  No complaints,  Here for follow up   No further episodes of leg edema  Still smoking   Had necrotizing fascitis in May  - was hospitalized for 16 days in [place] in  [place]  No further cp  Has occasional palpitations .    August 03, 2020: Emily [name] is seen for follow up  Labs drawn at a [place] facility May 17, 2020 show a triglyceride level of 320, HDL is 26, LDL is 114.  Total cholesterol is 196.  Wt is 220 lbs .  HbA1C  =  Not done this year  Has diabetes.  No CP ,  Has some occasional heart palpitations.  Has been walking some .  Still eats lots of bread  Drinks 2-3-[name] a day    Past Medical History:  Diagnosis Date  . Abnormal CT of liver   . Anxiety   . Asthma   . Asthma   . Bipolar 1 disorder ([place])   . Bipolar disorder ([place])   . Chronic pain   . Depression   . Fatty liver   . GERD (gastroesophageal reflux disease)   . Headache(784.0)   . Hyperlipidemia   . Hypertension   . Pulmonary nodule   . Tachycardia   . Thyroid disease     Past Surgical History:  Procedure Laterality Date  . DILATION AND CURETTAGE OF UTERUS     1 yr ago    Current Medications: Current Meds  Medication Sig  . ACCU-CHEK AVIVA PLUS test strip 1 each by Other route 3 (three) times daily.  Marland Kitchen ACCU-CHEK FASTCLIX LANCETS MISC USE AS DIRECTED  . albuterol (VENTOLIN HFA) 108 (90 Base) MCG/ACT inhaler Inhale 2 puffs into  the lungs every 4 (four) hours as needed.  Marland Kitchen amitriptyline (ELAVIL) 25 MG tablet Take 50 mg by mouth at bedtime.  Marland Kitchen amLODipine (NORVASC) 10 MG tablet TAKE 1 TABLET BY MOUTH EVERY DAY  . benztropine (COGENTIN) 1 MG tablet Take 1 mg by mouth 2 (two) times daily.  . Blood Glucose Monitoring Suppl (ACCU-CHEK AVIVA PLUS) w/Device KIT USE AS DIRECTED  . clonazePAM (KLONOPIN) 0.5 MG disintegrating tablet Take 0.5 mg by mouth daily as needed.  . fenofibrate (TRICOR) 145 MG tablet Take 1 tablet (145 mg total) by mouth daily.  . insulin glargine (LANTUS SOLOSTAR) 100 UNIT/ML Solostar Pen Inject 26 Units into the skin at bedtime. Can increase to 40 units/day  . Insulin Lispro Prot & Lispro (HUMALOG 75/25 MIX) (75-25) 100 UNIT/ML Kwikpen Inject 24 Units into the skin 2 (two) times daily.  . Insulin Pen Needle 32G X 4 MM MISC 1 each by Does not apply route as needed.  Marland Kitchen ipratropium-albuterol (DUONEB) 0.5-2.5 (3) MG/3ML SOLN Take 3 mLs by nebulization every 6 (six) hours as needed.  . lamoTRIgine (LAMICTAL) 150 MG tablet Take 150 mg by mouth daily.  Marland Kitchen levothyroxine (SYNTHROID, LEVOTHROID) 75 MCG tablet Take 1 tablet (75 mcg total) by mouth daily before breakfast.  . montelukast (SINGULAIR) 10 MG tablet TAKE 1 TABLET BY MOUTH EVERYDAY AT BEDTIME  . Nebulizers (COMPRESSOR/NEBULIZER) MISC Use as directed  . nitroGLYCERIN (NITROSTAT) 0.4 MG SL tablet Place 1 tablet (0.4 mg total) under the tongue every 5 (five) minutes as needed for chest pain.  Marland Kitchen omeprazole (PRILOSEC) 20 MG capsule Take 1 capsule (20 mg total) by mouth daily.  . paroxetine mesylate (PEXEVA) 20 MG tablet Take 20 mg by mouth daily.  Marland Kitchen PAXIL 20 MG tablet Take 20 mg by mouth daily.  . propranolol (INDERAL) 20 MG tablet Take 1 tablet (20 mg total) by mouth 3 (three) times daily.  Marland Kitchen Respiratory Therapy Supplies (NEBULIZER/TUBING/MOUTHPIECE) KIT Use as directed  . rosuvastatin (CRESTOR) 10 MG tablet Take 1 tablet (10 mg total) by mouth daily.  .  ziprasidone (GEODON) 60 MG capsule Take 60 mg by mouth daily.  . [DISCONTINUED] predniSONE (DELTASONE) 10 MG tablet 4tab x1d, 3tab x1d, 2tab x1d, 1tab x1d, 0.5tab x1d.   Current Facility-Administered Medications for the 08/03/20 encounter (Office Visit) with Emily [name], Wonda Cheng, MD  Medication  . clindamycin (CLEOCIN) 2 % vaginal cream 1 Applicatorful     Allergies:   Bactrim [sulfamethoxazole-trimethoprim], Ciprofloxacin,  Flagyl [metronidazole], Iodinated diagnostic agents, Doxycycline, Penicillins, and Prednisone   Social History   Socioeconomic History  . Marital status: Single    Spouse name: Not on file  . Number of children: Not on file  . Years of education: Not on file  . Highest education level: Not on file  Occupational History  . Not on file  Tobacco Use  . Smoking status: Current Every Day Smoker    Packs/day: 1.00    Years: 20.00    Pack years: 20.00    Types: Cigarettes  . Smokeless tobacco: Never Used  Vaping Use  . Vaping Use: Not on file  Substance and Sexual Activity  . Alcohol use: No  . Drug use: No  . Sexual activity: Not Currently    Partners: Male    Birth control/protection: Condom  Other Topics Concern  . Not on file  Social History Narrative  . Not on file   Social Determinants of Health   Financial Resource Strain: Not on file  Food Insecurity: Not on file  Transportation Needs: Not on file  Physical Activity: Not on file  Stress: Not on file  Social Connections: Not on file     Family History: The patient's family history includes Asthma in her mother; Diabetes in her maternal grandmother; Hypertension in her maternal grandmother.  ROS:   Please see the history of present illness.     All other systems reviewed and are negative.  EKGs/Labs/Other Studies Reviewed:    The following studies were reviewed today:    Recent Labs: No results found for requested labs within last 8760 hours.  Recent Lipid Panel    Component Value  Date/Time   CHOL 251 (H) 02/20/2015 0001   TRIG 516 (H) 02/20/2015 0001   HDL 29 (L) 02/20/2015 0001   CHOLHDL 8.7 (H) 02/20/2015 0001   VLDL NOT CALC 02/20/2015 0001   LDLCALC NOT CALC 02/20/2015 0001    Physical Exam:     Physical Exam: Blood pressure 126/72, pulse 94, height _0  (1.6 m), weight 220 lb (99.8 kg), last menstrual period 07/08/2020, SpO2 94 %.  GEN:  Young female,   Morbidly obese  HEENT: Normal NECK: No JVD; No carotid bruits LYMPHATICS: No lymphadenopathy CARDIAC: RRR , no murmurs, rubs, gallops RESPIRATORY:  Clear to auscultation without rales, wheezing or rhonchi  ABDOMEN: Soft, non-tender, non-distended MUSCULOSKELETAL:  No edema; No deformity  SKIN: Warm and dry NEUROLOGIC:  Alert and oriented x 3  ECG:    ASSESSMENT:    1. Precordial pain    PLAN:       1.   Dyspnea:   Has worsenig dyspnea this may be an anginal equivalent.  She has known coronary artery calcifications.  We have scheduled her for stress testing in the past but she did not show up.  We have also schedule her for heart catheterization and she did not show up for that either.  She does have evidence of premature coronary artery atherosclerosis.  She has hyperlipidemia and also very poorly controlled diabetes mellitus.  We have scheduled her for a [place] study.  I have encouraged her to please show up for this study as it is so expensive when we schedule patients and they do not show up.  Her father assured me that she would be there for the appointment.  She has been advised to go to the ER if she starts having worsening shortness of breath or chest discomfort.    2.  Hyperlipidemia:   Her cholesterol levels and her triglycerides are very elevated.  We will start her on rosuvastatin 10 mg a day.  We will start her also on fenofibrate 145 mg a day.  3.  Cigarette smoking: She smokes 1 pack of cigarettes a day.  I strongly encouraged her to stop smoking.    4.  Leg edema:   Improved   5.  Diabetes:   Very poorly controlled.   She still eats lots of bread and drinks 2-3 [name] a day.  Strongly encouraged her to fix her diet.  She will continue to work with her endocrinologist.   Medication Adjustments/Labs and Tests Ordered: Current medicines are reviewed at length with the patient today.  Concerns regarding medicines are outlined above.  Orders Placed This Encounter  Procedures  . ALT  . Basic metabolic panel  . Lipid panel  . Cardiac Stress Test: Informed Consent Details: Physician/Practitioner Attestation; Transcribe to consent form and obtain patient signature  . MYOCARDIAL PERFUSION IMAGING   Meds ordered this encounter  Medications  . rosuvastatin (CRESTOR) 10 MG tablet    Sig: Take 1 tablet (10 mg total) by mouth daily.    Dispense:  90 tablet    Refill:  3  . fenofibrate (TRICOR) 145 MG tablet    Sig: Take 1 tablet (145 mg total) by mouth daily.    Dispense:  90 tablet    Refill:  3    Patient Instructions  Medication Instructions:  Your physician has recommended you make the following change in your medication:   START Rosuvastatin 39m daily START Fenofibrate 142mdaily  *If you need a refill on your cardiac medications before your next appointment, please call your pharmacy*   Lab Work: BMET, LIPIDS, ALT Your physician recommends that you return for lab work in: 3 months  You will need to FAST for this appointment - nothing to eat or drink after midnight the night before except water.     Testing/Procedures: Your physician has requested that you have a lexiscan myoview. For further information please visit [email or web address] follow instruction sheet, as given.    Follow-Up: [facility] and your [facility] needs are our priority.  As part of our continuing mission to provide you with exceptional heart care, we have created designated Provider Care Teams.  These Care Teams include your primary Cardiologist  (physician) and Advanced Practice Providers (APPs -  Physician Assistants and Nurse Practitioners) who all work together to provide you with the care you need, when you need it.  Your next appointment:   3-4 month(s)  The format for your next appointment:   In Person  Provider:   You will see one of the following Advanced Practice Providers on your designated Care Team:    ScRichardson DoppPA-C  ViRobbie LisPAVermont      Signed, PhMertie MooresMD  08/03/2020 5:18 PM    [place]

## 2020-08-06 ENCOUNTER — Telehealth (HOSPITAL_COMMUNITY): Payer: Self-pay | Admitting: *Deleted

## 2020-08-06 NOTE — Telephone Encounter (Signed)
Patient given detailed instructions per Myocardial Perfusion Study Information Sheet for the test on 08/08/20. Patient notified to arrive 15 minutes early and that it is imperative to arrive on time for appointment to keep from having the test rescheduled.  If you need to cancel or reschedule your appointment, please call the office within 24 hours of your appointment. . Patient verbalized understanding. Emily [name] Jacqueline    

## 2020-08-07 MED ORDER — IPRATROPIUM-ALBUTEROL 0.5-2.5 (3) MG/3ML IN SOLN: 3.0000 mL | Freq: Four times a day (QID) | RESPIRATORY_TRACT | 3 refills | Status: DC | PRN

## 2020-08-07 NOTE — Addendum Note (Signed)
Addended by: Delrae Rend on: 08/07/2020 10:23 AM   Modules accepted: Orders

## 2020-08-08 ENCOUNTER — Encounter (HOSPITAL_COMMUNITY): Payer: Medicaid Other

## 2020-08-08 ENCOUNTER — Encounter (HOSPITAL_COMMUNITY): Payer: Self-pay

## 2020-08-13 ENCOUNTER — Telehealth: Payer: Self-pay | Admitting: Cardiovascular Disease

## 2020-08-13 ENCOUNTER — Other Ambulatory Visit: Payer: Self-pay

## 2020-08-13 ENCOUNTER — Ambulatory Visit (HOSPITAL_COMMUNITY): Payer: Medicaid Other | Attending: Cardiology

## 2020-08-13 DIAGNOSIS — R072 Precordial pain: Secondary | ICD-10-CM | POA: Insufficient documentation

## 2020-08-13 LAB — MYOCARDIAL PERFUSION IMAGING
LV dias vol: 76 mL (ref 46–106)
LV sys vol: 27 mL
Peak HR: 95 {beats}/min
Rest HR: 77 {beats}/min
SDS: 2
SRS: 0
SSS: 2
TID: 0.96

## 2020-08-13 MED ORDER — TECHNETIUM TC 99M TETROFOSMIN IV KIT
10.8000 | PACK | Freq: Once | INTRAVENOUS | Status: AC | PRN
Start: 2020-08-13 — End: 2020-08-13
  Administered 2020-08-13: 10.8 via INTRAVENOUS
  Filled 2020-08-13: qty 11

## 2020-08-13 MED ORDER — TECHNETIUM TC 99M TETROFOSMIN IV KIT
30.7000 | PACK | Freq: Once | INTRAVENOUS | Status: AC | PRN
  Administered 2020-08-13: 30.7 via INTRAVENOUS
  Filled 2020-08-13: qty 31

## 2020-08-13 MED ORDER — REGADENOSON 0.4 MG/5ML IV SOLN
0.4000 mg | Freq: Once | INTRAVENOUS | Status: AC
  Administered 2020-08-13: 0.4 mg via INTRAVENOUS

## 2020-08-13 NOTE — Telephone Encounter (Signed)
Attempted to call patient x 2. Call goes directly to voice mail. Left message for patient to call back for results.

## 2020-08-13 NOTE — Telephone Encounter (Signed)
Patient is calling to get her results °

## 2020-08-13 NOTE — Telephone Encounter (Signed)
Reviewed stress test results with patient who verbalized understanding. She has no further questions regarding the results and denies chest discomfort.  She sent a MyChart message earlier today regarding left leg swelling. She states there are days that she cannot get her pant leg up. Today, her left leg was examined by RN prior to her nuclear stress test. She denies pain in the calf or discomfort with flexion or extension. She has mild non-pitting edema without erythema. She admits to not being very active. Encouraged her to work on 30 minutes of activity daily and to continue to monitor swelling. She takes amlodipine and is aware leg swelling can be a side effect of this medication. She thanked me for the call.

## 2020-08-18 ENCOUNTER — Other Ambulatory Visit: Payer: Self-pay | Admitting: Internal Medicine

## 2020-08-27 ENCOUNTER — Telehealth: Payer: Self-pay | Admitting: Orthopaedic Surgery

## 2020-08-27 NOTE — Telephone Encounter (Signed)
Patient called to inquire about whether she may schedule here for low back pain, left side. Discussed where she may have been treated for this issue - relays she had seen ortho in [place]; states she does not currently have a primary care provider. Said she will go ahead and try back to the orthopaedist in [place].

## 2020-09-21 DIAGNOSIS — Z79899 Other long term (current) drug therapy: Secondary | ICD-10-CM

## 2020-09-21 DIAGNOSIS — R609 Edema, unspecified: Secondary | ICD-10-CM

## 2020-09-21 MED ORDER — POTASSIUM CHLORIDE CRYS ER 20 MEQ PO TBCR: 20.0000 meq | EXTENDED_RELEASE_TABLET | Freq: Every day | ORAL | 0 refills | Status: DC

## 2020-09-21 MED ORDER — HYDROCHLOROTHIAZIDE 25 MG PO TABS: 25.0000 mg | ORAL_TABLET | Freq: Every day | ORAL | 0 refills | Status: DC

## 2020-09-21 MED ORDER — AMLODIPINE BESYLATE 5 MG PO TABS: 5.0000 mg | ORAL_TABLET | Freq: Every day | ORAL | 0 refills | Status: DC

## 2020-09-21 NOTE — Telephone Encounter (Signed)
Per Dr Elease Hashimoto:  Her leg edema may be caused by her high dose amlodipine  Reduce amlodipine to 5 mg a day  Add HCTZ 25 mg a day  Kdur 20 meq a day  Check BMP in 2-3 weeks   She needs to stop drinking soda.  She needs to exercise regularly ( walk)  If she sits with her legs down, they will contrinue to swell   She needs to continue to work on weight loss efforts.

## 2020-09-21 NOTE — Addendum Note (Signed)
Addended by: Alois Cliche on: 09/21/2020 11:32 AM   Modules accepted: Orders

## 2020-10-10 ENCOUNTER — Other Ambulatory Visit: Payer: Self-pay

## 2020-10-10 ENCOUNTER — Other Ambulatory Visit: Payer: Medicaid Other | Admitting: *Deleted

## 2020-10-10 DIAGNOSIS — Z79899 Other long term (current) drug therapy: Secondary | ICD-10-CM

## 2020-10-10 DIAGNOSIS — R609 Edema, unspecified: Secondary | ICD-10-CM

## 2020-10-10 LAB — BASIC METABOLIC PANEL
BUN/Creatinine Ratio: 7 — ABNORMAL LOW (ref 9–23)
BUN: 5 mg/dL — ABNORMAL LOW (ref 6–20)
CO2: 23 mmol/L (ref 20–29)
Calcium: 9.1 mg/dL (ref 8.7–10.2)
Chloride: 96 mmol/L (ref 96–106)
Creatinine, Ser: 0.75 mg/dL (ref 0.57–1.00)
Glucose: 250 mg/dL — ABNORMAL HIGH (ref 65–99)
Potassium: 4.3 mmol/L (ref 3.5–5.2)
Sodium: 134 mmol/L (ref 134–144)
eGFR: 104 mL/min/{1.73_m2} (ref >59)

## 2020-10-11 ENCOUNTER — Telehealth: Payer: Self-pay | Admitting: Cardiovascular Disease

## 2020-10-11 NOTE — Telephone Encounter (Signed)
Informed patient that Dr. Elease Hashimoto has not resulted her lab work yet, but as soon as he does we will let her know. Patient verbalized understanding.

## 2020-10-11 NOTE — Telephone Encounter (Signed)
Pt called in wanting results from recent lab work... please advise

## 2020-10-13 ENCOUNTER — Other Ambulatory Visit: Payer: Self-pay | Admitting: Cardiovascular Disease

## 2020-10-15 NOTE — Telephone Encounter (Signed)
Rx(s) sent to pharmacy electronically.  

## 2020-10-17 NOTE — Telephone Encounter (Signed)
Primary Pulmonologist: Ramaswamy Last office visit and with whom: 07/05/20 with [name] What do we see them for (pulmonary problems): SOB, tobacco use disorder Last OV assessment/plan:  Assessment & Plan Note by Julio Sicks, NP at 07/09/2020 2:01 PM  Author: Julio Sicks, NP Author Type: Nurse Practitioner Filed: 07/09/2020  2:02 PM  Note Status: Written Cosign: Cosign Not Required Encounter Date: 07/05/2020  Problem: TOBACCO ABUSE  Editor: Julio Sicks, NP (Nurse Practitioner)             Smoking cessation         Assessment & Plan Note by Julio Sicks, NP at 07/09/2020 1:59 PM  Author: Julio Sicks, NP Author Type: Nurse Practitioner Filed: 07/09/2020  1:59 PM  Note Status: Written Cosign: Cosign Not Required Encounter Date: 07/05/2020  Problem: Chronic bronchitis (HCC)  Editor: Julio Sicks, NP (Nurse Practitioner)             Continue on Duoneb Chest xray PFT on return   Plan  Patient Instructions  Chest xray today . Continue on Duoneb 2-3 times a day . Activity as tolerated. Mucinex DM Twice daily  As needed  Cough/cogestion . Work on not smoking . Follow up with Dr. Marchelle Gearing in 4 months with PFTs and As needed         Was appointment offered to patient (explain)?  Pt wants recommendations. No openings for any appt for pt to have acute visit until 7/5   Reason for call: Called and spoke with pt in regards to her mychart message that she sent [place] stating that she feels like she might have pneumonia.  Pt said she started having symptoms beginning 2 days ago including chest tightness, mild cough with green phlegm, and wheezing.  Pt denies any complaints of fever as her last temp yesterday 6/28 was 97.0  Pt said that she has been taking mucinex as well as doing two neb treatments daily.  Pt wants to know what we recommend.  Sarah, please advise.  Allergies  Allergen Reactions   Bactrim [Sulfamethoxazole-Trimethoprim] Anaphylaxis and Other (See  Comments)    Chest pain, trouble breathing   Ciprofloxacin Palpitations   Flagyl [Metronidazole] Shortness Of Breath   Iodinated Diagnostic Agents Swelling   Doxycycline Nausea And Vomiting   Penicillins    Prednisone Other (See Comments)    Makes her angry and irritable    Immunization History  Administered Date(s) Administered   Influenza Split 03/22/2014   Influenza Whole 01/13/2008   Influenza, Seasonal, Injecte, Preservative Fre 04/18/2015   Influenza,inj,Quad PF,6+ Mos 04/18/2015   PFIZER(Purple Top)SARS-COV-2 Vaccination 03/12/2020, 04/16/2020   Pneumococcal Polysaccharide-23 05/10/2012   Tdap 01/02/2006, 08/08/2015

## 2020-10-17 NOTE — Telephone Encounter (Signed)
Bevelyn Ngo, NP  You 4 minutes ago (3:47 PM)   Needs to be seen by any provider with openings with CXR prior. If there are no openings with our providers, she should seek care at an Urgent Care. If symptoms worsen before she is seen, have her seek Emergency care.   Called and spoke with pt letting her know the info stated by SG. No openings at office so stated to her to go to UC to be evaluated and she verbalized understanding. Nothing further needed.

## 2020-10-18 ENCOUNTER — Other Ambulatory Visit: Payer: Self-pay | Admitting: Adult Health

## 2020-10-23 NOTE — Telephone Encounter (Signed)
Left a message for patient to call back. 

## 2020-10-28 ENCOUNTER — Other Ambulatory Visit: Payer: Self-pay | Admitting: Cardiovascular Disease

## 2020-10-30 NOTE — Progress Notes (Deleted)
Cardiology Office Note:    Date:  10/30/2020   ID:  Emily [name], DOB June 10, 1980, MRN [id number]  PCP:  Emily Bookbinder, PA-C   [facility] Providers Cardiologist:  Emily Miss, MD { Click to update primary MD,subspecialty MD or APP then REFRESH:1}  ***  Referring MD: Emily Bookbinder, PA-C   Chief Complaint:  No chief complaint on file.     Patient Profile:    Emily [name] is a 40 y.o. female with:  Coronary artery calcifications on CT scan Hypertension  Hyperlipidemia  Diabetes mellitus  Hypothyroidism  FHx of CAD  Hx of necrotizing fasciitis in 2021 GERD  Asthma  Bipolar disorder  +Cigs  Prior CV studies: GATED SPECT MYO PERF W/LEXISCAN STRESS 1D 08/13/2020 Narrative  Nuclear stress EF: 64%.  There was no ST segment deviation noted during stress.  The left ventricular ejection fraction is normal (55-65%).  The study is normal.  This is a low risk study.  Echocardiogram 03/01/2019  1. Left ventricular ejection fraction, by visual estimation, is 60 to  65%. The left ventricle has normal function. Left ventricular septal wall  thickness was normal. There is no left ventricular hypertrophy.   2. Global right ventricle has normal systolic function.The right  ventricular size is normal. No increase in right ventricular wall  thickness.   3. Left atrial size was normal.   4. Right atrial size was normal.   5. The mitral valve is normal in structure. No evidence of mitral valve  regurgitation. No evidence of mitral stenosis.   6. The tricuspid valve is normal in structure. Tricuspid valve  regurgitation is not demonstrated.   7. The aortic valve has an indeterminant number of cusps. Aortic valve  regurgitation is not visualized. No evidence of aortic valve sclerosis or  stenosis.   8. The pulmonic valve was not well visualized. Pulmonic valve  regurgitation is not visualized.   9. The inferior vena cava is normal in size with greater than 50%   respiratory variability, suggesting right atrial pressure of 3 mmHg.   History of Present Illness: Emily [name] last saw Dr. Elease [name] in 4/22.  She had originally been evaluated for coronary Ca2+ noted on CT which was originally ordered to evaluated shortness of breath.  She was having symptoms of shortness of breath.  It was noted she had been set up for a Myoview and cardiac catheterization in the past but she did not show up to do those tests.  Dr. Elease [name] arrange a Myoview which was done 08/13/20 and was low risk.      Past Medical History:  Diagnosis Date   Abnormal CT of liver    Anxiety    Asthma    Asthma    Bipolar 1 disorder (HCC)    Bipolar disorder (HCC)    Chronic pain    Depression    Fatty liver    GERD (gastroesophageal reflux disease)    Headache(784.0)    Hyperlipidemia    Hypertension    Pulmonary nodule    Tachycardia    Thyroid disease     Current Medications: No outpatient medications have been marked as taking for the 10/31/20 encounter (Appointment) with Emily [name] T, PA-C.   Current Facility-Administered Medications for the 10/31/20 encounter (Appointment) with Emily Lecher, PA-C  Medication   clindamycin (CLEOCIN) 2 % vaginal cream 1 Applicatorful     Allergies:   Bactrim [sulfamethoxazole-trimethoprim], Ciprofloxacin, Flagyl [metronidazole], Iodinated diagnostic agents, Doxycycline, Penicillins, and Prednisone  Social History   Tobacco Use   Smoking status: Every Day    Packs/day: 1.00    Years: 20.00    Pack years: 20.00    Types: Cigarettes   Smokeless tobacco: Never  Substance Use Topics   Alcohol use: No   Drug use: No     Family Hx: The patient's family history includes Asthma in her mother; Diabetes in her maternal grandmother; Hypertension in her maternal grandmother.  ROS   EKGs/Labs/Other Test Reviewed:    EKG:  EKG is *** ordered today.  The ekg ordered today demonstrates ***  Recent Labs: 10/10/2020: BUN 5; Creatinine,  Ser 0.75; Potassium 4.3; Sodium 134   Recent Lipid Panel Lab Results  Component Value Date/Time   CHOL 251 (H) 02/20/2015 12:01 AM   TRIG 516 (H) 02/20/2015 12:01 AM   HDL 29 (L) 02/20/2015 12:01 AM   LDLCALC NOT CALC 02/20/2015 12:01 AM      Risk Assessment/Calculations:   {Does this patient have ATRIAL FIBRILLATION?:[id number]}  Physical Exam:    VS:  There were no vitals taken for this visit.    Wt Readings from Last 3 Encounters:  08/13/20 220 lb (99.8 kg)  08/03/20 220 lb (99.8 kg)  07/05/20 216 lb (98 kg)     Physical Exam ***     ASSESSMENT & PLAN:    ***    {Are you ordering a CV Procedure (e.g. stress test, cath, DCCV, TEE, etc)?   Press F2        :[id number]}    Dispo:  No follow-ups on file.   Medication Adjustments/Labs and Tests Ordered: Current medicines are reviewed at length with the patient today.  Concerns regarding medicines are outlined above.  Tests Ordered: No orders of the defined types were placed in this encounter.  Medication Changes: No orders of the defined types were placed in this encounter.   Signed, Emily Newcomer, PA-C  10/30/2020 1:54 PM    [facility] [address] [place], [place], [place]  [address] Phone: [phone]; Fax: [phone]

## 2020-10-31 ENCOUNTER — Ambulatory Visit: Payer: Medicaid Other | Admitting: Physician Assistant

## 2020-10-31 ENCOUNTER — Other Ambulatory Visit: Payer: Medicaid Other

## 2020-11-02 ENCOUNTER — Other Ambulatory Visit: Payer: Medicaid Other

## 2020-11-02 ENCOUNTER — Ambulatory Visit: Payer: Medicaid Other | Admitting: Physician Assistant

## 2020-11-05 ENCOUNTER — Other Ambulatory Visit: Payer: Self-pay | Admitting: Adult Health

## 2020-11-05 DIAGNOSIS — R600 Localized edema: Secondary | ICD-10-CM

## 2020-11-05 DIAGNOSIS — M79604 Pain in right leg: Secondary | ICD-10-CM

## 2020-11-05 DIAGNOSIS — M79605 Pain in left leg: Secondary | ICD-10-CM

## 2020-11-08 ENCOUNTER — Telehealth: Payer: Self-pay | Admitting: Cardiovascular Disease

## 2020-11-08 ENCOUNTER — Other Ambulatory Visit: Payer: Self-pay | Admitting: Cardiovascular Disease

## 2020-11-08 DIAGNOSIS — Z79899 Other long term (current) drug therapy: Secondary | ICD-10-CM

## 2020-11-08 DIAGNOSIS — R609 Edema, unspecified: Secondary | ICD-10-CM

## 2020-11-08 MED ORDER — AMLODIPINE BESYLATE 5 MG PO TABS: 5.0000 mg | ORAL_TABLET | Freq: Every day | ORAL | 0 refills | Status: DC

## 2020-11-08 MED ORDER — HYDROCHLOROTHIAZIDE 25 MG PO TABS: 25.0000 mg | ORAL_TABLET | Freq: Every day | ORAL | 0 refills | Status: DC

## 2020-11-08 MED ORDER — LOSARTAN POTASSIUM 50 MG PO TABS: 50.0000 mg | ORAL_TABLET | Freq: Every day | ORAL | 0 refills | Status: DC

## 2020-11-08 NOTE — Telephone Encounter (Signed)
Placed call to pt in regards to her phone message for refill for Amlodipine and HCTZ.  I let her know that she had a mychart message sent by triage in regards to her elevated bp.  She has been made aware of the message below,  "Our records indicate that should she should be on amlodipine 5 mg a day as well as HCTZ 25 mg a day.  Is she limiting her salt.  On previous visit she has not made any effort on limiting her salt intake.  She also needs to limit processed meats such as bacon, sausage, hotdogs, bologna, prepared meals, fast foods, restaurant foods, canned foods   She has diabetes and she would also be on an ARB or ACE inhibitor.   We can add Losartan 50 mg a day .  She needs to exercise 4-5 times a week .  Needs to work on weight loss  Limit her salt as above.  She needs to stop smoking  ALL if these are contributing to her elevated BP   BMP in 3 weeks"  Pt will start Losartan 50 mg daily, continue Amlodipine 5 mg daily and HCTZ 25 mg daily.   I have ordered the BMET.  Pt is seeing Dr. Elease Hashimoto 11/12/2020 and he can discuss further at that time.  Pt verbalized understanding and thanked me for the return call.

## 2020-11-08 NOTE — Telephone Encounter (Signed)
*  STAT* If patient is at the pharmacy, call can be transferred to refill team.   1. Which medications need to be refilled? (please list name of each medication and dose if known)  hydrochlorothiazide (HYDRODIURIL) 25 MG tablet amLODipine (NORVASC) 5 MG tablet  2. Which pharmacy/location (including street and city if local pharmacy) is medication to be sent to? [id number] - [place], [place] - [address]  3. Do they need a 30 day or 90 day supply? 30 with refills  Patient is out of medication.  Mother initially called for the patient, but she is not on the HIPPA form. The Father took the phone and requested the medication refill

## 2020-11-11 ENCOUNTER — Encounter: Payer: Self-pay | Admitting: Cardiovascular Disease

## 2020-11-11 NOTE — Progress Notes (Signed)
Cardiology Office Note:    Date:  11/12/2020   ID:  Emily [name], DOB 1980/08/28, MRN [id number]  PCP:  Harlene Salts, PA-C  Cardiologist:  Mertie Moores, MD  Electrophysiologist:  None   Problem list 1.  Coronary artery calcifications 2.  Hyperlipidemia 3.  Hypertension 4.  Hypothyroidism 5.  Diabetes mellitus  Referring MD: Harlene Salts, PA-C   Chief Complaint  Patient presents with   Hypertension   Leg Swelling     Feb. 11, 2020    Emily [name] is a 40 y.o. female with a hx of hypertension and hyperlipidemia.   Was seen with her sister [name].   She recently had a CT scan of the lungs and was incidentally found to have coronary artery calcifications. We are asked to see her today by Harlene Salts, PA-C for further evaluations of these coronary artery calcifications   Emily [name] had a CT scan for further evaluation of some shortness of breath.  The shortness of breath seems to be worsening over the past several months.  She was incidentally found to have coronary artery calcifications.  Has occasional chest tightness/ pressure like sensation  with exertion.     She has these episodes of tightness whenever she does anything exertional such as climbing stairs or walking from the parking lot into a store. The pain is described as a pressure-like pain or a tightness.  It last for as long as she is walking and then resolves after several minutes after she stops walking.  She is never had any prolonged episodes.  Does not do any regular exercise   Eats lots of junk food.    Does not pay attention to her diet Goes to [employer] lots.    Eats late at night  is not working  - trying to get disability .    + family hx of CAD  - mother and father both had cad   March 05, 2019:  Emily [name]  seen today for follow-up visit.  I saw her earlier this year.  She had a history of coronary artery calcifications and had symptoms that were worrisome for unstable angina.   She has a strong family history of coronary disease and has multiple risk factors including hyperlipidemia and cigarette smoking.  I saw her in Jan.   For angina . Has lots of CP with anxiety. No exercise per se.  Complains of lung issues.   Still smoking .  She is not having any further episodes of angina.  She admits that she is very stressed out.  Complains of 4 dimples in her leg.  She complains of leg swelling.  She admits that she eats salty foods.  She likes soup.  Still eats some salty foods.   Sees pulmonary   November 28, 2019:  No complaints,  Here for follow up   No further episodes of leg edema  Still smoking   Had necrotizing fascitis in May  - was hospitalized for 16 days in [place] in  [place]  No further cp  Has occasional palpitations .    August 03, 2020: Emily [name] is seen for follow up  Labs drawn at a [place] facility May 17, 2020 show a triglyceride level of 320, HDL is 26, LDL is 114.  Total cholesterol is 196.  Wt is 220 lbs .  HbA1C  =  Not done this year  Has diabetes.  No CP ,  Has some occasional heart palpitations.  Has been walking some .  Still eats lots of bread  Drinks 2-3-[name] a day    November 12, 2020;  Emily [name] is seen today for follow up of HTN, CP, leg edema    She had some chest discomfort when I saw her in April.  A Lexiscan Myoview study revealed no evidence of ischemia.  There is no evidence of previous infarction. She did have some ant. Apical attenuation , thought to be breast attenuation  She presented to the ED this am complaining of feeling like she was going to pass out Dry mouth,  felt dizzy  WBC is 12.4 UA shows hazy urine, + nitrites, + WBC Glucose is elevated.   She has diabetes which is very poorly controlled  Still smokes ,  1 ppd ,  Drinks 2-3 [name] a day   Does not cook,  does not know how to cook - I gave her a quick lesson on baking chicken . Has lost her potassium bottle , potassium was  low on this mornings blood  Has started back on insulin  - just started a week or so  Has started going to [employer].     Past Medical History:  Diagnosis Date   Abnormal CT of liver    Anxiety    Asthma    Asthma    Bipolar 1 disorder (HCC)    Bipolar disorder (HCC)    Chronic pain    Depression    Fatty liver    GERD (gastroesophageal reflux disease)    Headache(784.0)    Hyperlipidemia    Hypertension    Pulmonary nodule    Tachycardia    Thyroid disease     Past Surgical History:  Procedure Laterality Date   DILATION AND CURETTAGE OF UTERUS     1 yr ago    Current Medications: Current Facility-Administered Medications for the 11/12/20 encounter (Office Visit) with Kendrell Lottman, Wonda Cheng, MD  Medication   clindamycin (CLEOCIN) 2 % vaginal cream 1 Applicatorful   Current Meds  Medication Sig   ACCU-CHEK AVIVA PLUS test strip 1 each by Other route 3 (three) times daily.   ACCU-CHEK FASTCLIX LANCETS MISC USE AS DIRECTED   albuterol (VENTOLIN HFA) 108 (90 Base) MCG/ACT inhaler Inhale 2 puffs into the lungs every 4 (four) hours as needed.   amitriptyline (ELAVIL) 25 MG tablet Take 50 mg by mouth at bedtime.   amLODipine (NORVASC) 5 MG tablet Take 1 tablet (5 mg total) by mouth daily.   benztropine (COGENTIN) 1 MG tablet Take 1 mg by mouth 2 (two) times daily.   Blood Glucose Monitoring Suppl (ACCU-CHEK AVIVA PLUS) w/Device KIT USE AS DIRECTED   cephALEXin (KEFLEX) 250 MG capsule Take 1 capsule (250 mg total) by mouth 4 (four) times daily. For 7 days   clonazePAM (KLONOPIN) 0.5 MG disintegrating tablet Take 0.5 mg by mouth daily as needed.   fenofibrate (TRICOR) 145 MG tablet Take 1 tablet (145 mg total) by mouth daily.   insulin glargine (LANTUS SOLOSTAR) 100 UNIT/ML Solostar Pen Inject 26 Units into the skin at bedtime. Can increase to 40 units/day   Insulin Lispro Prot & Lispro (HUMALOG 75/25 MIX) (75-25) 100 UNIT/ML Kwikpen Inject 24 Units into the skin 2 (two) times  daily.   Insulin Pen Needle 32G X 4 MM MISC 1 each by Does not apply route as needed.   ipratropium-albuterol (DUONEB) 0.5-2.5 (3) MG/3ML SOLN TAKE 3 MLS BY NEBULIZATION EVERY 6 (SIX) HOURS AS NEEDED.  lamoTRIgine (LAMICTAL) 150 MG tablet Take 150 mg by mouth daily.   levothyroxine (SYNTHROID, LEVOTHROID) 75 MCG tablet Take 1 tablet (75 mcg total) by mouth daily before breakfast.   losartan (COZAAR) 50 MG tablet Take 1 tablet (50 mg total) by mouth daily.   montelukast (SINGULAIR) 10 MG tablet TAKE 1 TABLET BY MOUTH EVERYDAY AT BEDTIME   Nebulizers (COMPRESSOR/NEBULIZER) MISC Use as directed   nitroGLYCERIN (NITROSTAT) 0.4 MG SL tablet Place 1 tablet (0.4 mg total) under the tongue every 5 (five) minutes as needed for chest pain.   NOVOLOG FLEXPEN 100 UNIT/ML FlexPen SMARTSIG:12 Unit(s) SUB-Q   omeprazole (PRILOSEC) 20 MG capsule Take 1 capsule (20 mg total) by mouth daily.   paroxetine mesylate (PEXEVA) 20 MG tablet Take 20 mg by mouth daily.   PAXIL 20 MG tablet Take 20 mg by mouth daily.   propranolol (INDERAL) 20 MG tablet Take 1 tablet (20 mg total) by mouth 3 (three) times daily.   Respiratory Therapy Supplies (NEBULIZER/TUBING/MOUTHPIECE) KIT Use as directed   rosuvastatin (CRESTOR) 10 MG tablet Take 1 tablet (10 mg total) by mouth daily.   ziprasidone (GEODON) 60 MG capsule Take 60 mg by mouth daily.   [DISCONTINUED] hydrochlorothiazide (HYDRODIURIL) 25 MG tablet Take 1 tablet (25 mg total) by mouth daily.   [DISCONTINUED] KLOR-CON M20 20 MEQ tablet TAKE 1 TABLET BY MOUTH EVERY DAY     Allergies:   Bactrim [sulfamethoxazole-trimethoprim], Ciprofloxacin, Flagyl [metronidazole], Iodinated diagnostic agents, Doxycycline, Penicillins, and Prednisone   Social History   Socioeconomic History   Marital status: Single    Spouse name: Not on file   Number of children: Not on file   Years of education: Not on file   Highest education level: Not on file  Occupational History   Not on  file  Tobacco Use   Smoking status: Every Day    Packs/day: 1.00    Years: 20.00    Pack years: 20.00    Types: Cigarettes   Smokeless tobacco: Never  Vaping Use   Vaping Use: Not on file  Substance and Sexual Activity   Alcohol use: No   Drug use: No   Sexual activity: Not Currently    Partners: Male    Birth control/protection: Condom  Other Topics Concern   Not on file  Social History Narrative   Not on file   Social Determinants of Health   Financial Resource Strain: Not on file  Food Insecurity: Not on file  Transportation Needs: Not on file  Physical Activity: Not on file  Stress: Not on file  Social Connections: Not on file     Family History: The patient's family history includes Asthma in her mother; Diabetes in her maternal grandmother; Hypertension in her maternal grandmother.  ROS:   Please see the history of present illness.     All other systems reviewed and are negative.  EKGs/Labs/Other Studies Reviewed:    The following studies were reviewed today:    Recent Labs: 11/12/2020: BUN 6; Creatinine, Ser 0.81; Hemoglobin 13.8; Platelets 203; Potassium 3.3; Sodium 128  Recent Lipid Panel    Component Value Date/Time   CHOL 251 (H) 02/20/2015 0001   TRIG 516 (H) 02/20/2015 0001   HDL 29 (L) 02/20/2015 0001   CHOLHDL 8.7 (H) 02/20/2015 0001   VLDL NOT CALC 02/20/2015 0001   LDLCALC NOT CALC 02/20/2015 0001    Physical Exam:     Physical Exam: Blood pressure 116/70, pulse 75, height _0  (1.6 m),  weight 218 lb (98.9 kg), SpO2 94 %.  GEN:  young , obese female  HEENT: Normal NECK: No JVD; No carotid bruits LYMPHATICS: No lymphadenopathy CARDIAC: RRR , no murmurs, rubs, gallops RESPIRATORY:  Clear to auscultation without rales, wheezing or rhonchi  ABDOMEN: Soft, non-tender, non-distended MUSCULOSKELETAL:  No edema; No deformity  SKIN: Warm and dry NEUROLOGIC:  Alert and oriented x 3  ECG:    ASSESSMENT:    1. Medication management    2. Essential hypertension   3. Type 2 diabetes mellitus with complication, without long-term current use of insulin (HCC)     PLAN:       1.    HTN: Her blood pressure is actually on the low side.  I suspect that she is a little bit overmedicated at this point.  We will discontinue the HCTZ and potassium.  This also might help with her symptoms of lightheadedness.  Will have her see an APP in 3 months   2.  Lightheadedness: She has multiple reasons to be lightheaded.  Her blood pressure is lower than it typically is.  She also has a urinary tract infection.  We are going to discontinue the HCTZ and potassium.  I advised her to restrict her salt intake.  She currently eats a very high salt diet.   2.  Hyperlipidemia:    -   3.  Cigarette smoking:  advised cessation   4.  Leg edema:  no eema today  Is due to her very high salt diet   5.  Diabetes:    has restarted insulin   6.  UTI:   was in the ER this am. Has a UTI.  Has had symptoms for a week  Will ask our pharmacist to help with choice of abx.  She is allergic to multiple abx.  Megan Supple, RPharm recommneded Keflex 250 mg QID for 7 days.  Follow up with her primary MD    Medication Adjustments/Labs and Tests Ordered: Current medicines are reviewed at length with the patient today.  Concerns regarding medicines are outlined above.  Orders Placed This Encounter  Procedures   AMB Referral to [facility]   EKG 12-Lead    Meds ordered this encounter  Medications   cephALEXin (KEFLEX) 250 MG capsule    Sig: Take 1 capsule (250 mg total) by mouth 4 (four) times daily. For 7 days    Dispense:  28 capsule    Refill:  0      Patient Instructions  Medication Instructions:  Your physician has recommended you make the following change in your medication: 1) STOP taking hydrochlorothiazide (HCTZ) 2) STOP taking potassium   3) START taking Kelfex (cephalexin) 250 mg 4 times per day for 7 days *If you need  a refill on your cardiac medications before your next appointment, please call your pharmacy*   Follow-Up: At [facility], you and your health needs are our priority.  As part of our continuing mission to provide you with exceptional heart care, we have created designated Provider Care Teams.  These Care Teams include your primary Cardiologist (physician) and Advanced Practice Providers (APPs -  Physician Assistants and Nurse Practitioners) who all work together to provide you with the care you need, when you need it.   Your next appointment:   3 month(s)  The format for your next appointment:   In Person  Provider:   You will see one of the following Advanced Practice Providers on your designated  Care Team:   Richardson Dopp, PA-C Robbie Lis, [place]   Other Instructions You have been referred to [facility] and Wellness   Signed, Mertie Moores, MD  11/12/2020 11:14 AM    [place]

## 2020-11-12 ENCOUNTER — Encounter: Payer: Self-pay | Admitting: Cardiovascular Disease

## 2020-11-12 ENCOUNTER — Encounter (HOSPITAL_COMMUNITY): Payer: Self-pay

## 2020-11-12 ENCOUNTER — Telehealth: Payer: Self-pay | Admitting: Internal Medicine

## 2020-11-12 ENCOUNTER — Emergency Department (HOSPITAL_COMMUNITY)
Admission: EM | Admit: 2020-11-12 | Discharge: 2020-11-12 | Disposition: A | Discharge status: Home / Self Care | Payer: Medicaid Other | Attending: Emergency Medicine | Admitting: Emergency Medicine

## 2020-11-12 ENCOUNTER — Ambulatory Visit (INDEPENDENT_AMBULATORY_CARE_PROVIDER_SITE_OTHER): Payer: Medicaid Other | Admitting: Cardiovascular Disease

## 2020-11-12 VITALS — BP 116/70 | HR 75 | Ht 63.0 in | Wt 218.0 lb

## 2020-11-12 DIAGNOSIS — I10 Essential (primary) hypertension: Secondary | ICD-10-CM

## 2020-11-12 DIAGNOSIS — Z79899 Other long term (current) drug therapy: Secondary | ICD-10-CM | POA: Diagnosis not present

## 2020-11-12 DIAGNOSIS — Z5321 Procedure and treatment not carried out due to patient leaving prior to being seen by health care provider: Secondary | ICD-10-CM | POA: Insufficient documentation

## 2020-11-12 DIAGNOSIS — E118 Type 2 diabetes mellitus with unspecified complications: Secondary | ICD-10-CM

## 2020-11-12 DIAGNOSIS — R55 Syncope and collapse: Secondary | ICD-10-CM | POA: Diagnosis not present

## 2020-11-12 LAB — CBC
HCT: 42.3 % (ref 36.0–46.0)
Hemoglobin: 13.8 g/dL (ref 12.0–15.0)
MCH: 36.1 pg — ABNORMAL HIGH (ref 26.0–34.0)
MCHC: 32.6 g/dL (ref 30.0–36.0)
MCV: 110.7 fL — ABNORMAL HIGH (ref 80.0–100.0)
Platelets: 203 10*3/uL (ref 150–400)
RBC: 3.82 MIL/uL — ABNORMAL LOW (ref 3.87–5.11)
RDW: 13.3 % (ref 11.5–15.5)
WBC: 12.4 10*3/uL — ABNORMAL HIGH (ref 4.0–10.5)
nRBC: 0 % (ref 0.0–0.2)

## 2020-11-12 LAB — URINALYSIS, ROUTINE W REFLEX MICROSCOPIC
Bilirubin Urine: NEGATIVE
Glucose, UA: NEGATIVE mg/dL
Hgb urine dipstick: NEGATIVE
Ketones, ur: NEGATIVE mg/dL
Nitrite: POSITIVE — AB
Protein, ur: NEGATIVE mg/dL
Specific Gravity, Urine: 1.006 (ref 1.005–1.030)
pH: 6 (ref 5.0–8.0)

## 2020-11-12 LAB — BASIC METABOLIC PANEL
Anion gap: 10 (ref 5–15)
BUN: 6 mg/dL (ref 6–20)
CO2: 26 mmol/L (ref 22–32)
Calcium: 8.7 mg/dL — ABNORMAL LOW (ref 8.9–10.3)
Chloride: 92 mmol/L — ABNORMAL LOW (ref 98–111)
Creatinine, Ser: 0.81 mg/dL (ref 0.44–1.00)
GFR, Estimated: >60 mL/min (ref >60)
Glucose, Bld: 223 mg/dL — ABNORMAL HIGH (ref 70–99)
Potassium: 3.3 mmol/L — ABNORMAL LOW (ref 3.5–5.1)
Sodium: 128 mmol/L — ABNORMAL LOW (ref 135–145)

## 2020-11-12 LAB — I-STAT BETA HCG BLOOD, ED (MC, WL, AP ONLY): I-stat hCG, quantitative: <5 m[IU]/mL (ref <5)

## 2020-11-12 MED ORDER — CEPHALEXIN 250 MG PO CAPS: 250.0000 mg | ORAL_CAPSULE | Freq: Four times a day (QID) | ORAL | 0 refills | Status: DC

## 2020-11-12 NOTE — ED Triage Notes (Signed)
Pt states that since yesterday she has been feeling like she is going to pass out. Denies CP

## 2020-11-12 NOTE — ED Provider Notes (Signed)
Patient left the ED before being seen.  It appears that she is seeing cardiology, at this time in the office.   Mancel Bale, MD 11/12/20 [phone]

## 2020-11-12 NOTE — ED Notes (Signed)
Patient stepped outside  

## 2020-11-12 NOTE — Telephone Encounter (Signed)
Returned a page overnight to the patient who was complaining of feeling significant lightheadedness and near syncopal events over the last several days.  She notes after starting Lasix recently she has not felt the same.  Notes that she is having some issues with vision, lightheadedness, and multiple near syncopal events.  Has not measured vitals at home.  Given the symptoms, I have recommended that she come to the emergency department for further evaluation.  She stated understanding of these recommendations and my concern.  Gerre Scull MD Cardiology fellow

## 2020-11-12 NOTE — Patient Instructions (Addendum)
Medication Instructions:  Your physician has recommended you make the following change in your medication: 1) STOP taking hydrochlorothiazide (HCTZ) 2) STOP taking potassium   3) START taking Kelfex (cephalexin) 250 mg 4 times per day for 7 days *If you need a refill on your cardiac medications before your next appointment, please call your pharmacy*   Follow-Up: At [facility], you and your health needs are our priority.  As part of our continuing mission to provide you with exceptional heart care, we have created designated Provider Care Teams.  These Care Teams include your primary Cardiologist (physician) and Advanced Practice Providers (APPs -  Physician Assistants and Nurse Practitioners) who all work together to provide you with the care you need, when you need it.   Your next appointment:   3 month(s)  The format for your next appointment:   In Person  Provider:   You will see one of the following Advanced Practice Providers on your designated Care Team:   Tereso Newcomer, PA-C Vin Iver Nestle, [place]   Other Instructions You have been referred to [facility] and Wellness

## 2020-11-14 ENCOUNTER — Other Ambulatory Visit: Payer: Self-pay | Admitting: Adult Health

## 2020-11-14 ENCOUNTER — Other Ambulatory Visit: Payer: Self-pay | Admitting: Internal Medicine

## 2020-11-14 MED ORDER — IPRATROPIUM-ALBUTEROL 0.5-2.5 (3) MG/3ML IN SOLN: 3.0000 mL | Freq: Four times a day (QID) | RESPIRATORY_TRACT | 0 refills | Status: DC | PRN

## 2020-11-14 NOTE — Addendum Note (Signed)
Addended by: Judd Gaudier on: 11/14/2020 04:38 PM   Modules accepted: Orders

## 2020-11-23 NOTE — Telephone Encounter (Signed)
Spoke with Emily [name] and is now complaining of left ankle swelling and pain radiating to hip Emily [name] also notes in the am upon getting out of bed ankle is still swollen . Per Emily [name] has been walking around apartment complex but seems with any activity it causes swelling  Per Emily [name] color is good but is cold to touch Will forward to Dr Elease Hashimoto for review and recommendations ./cy

## 2020-11-30 ENCOUNTER — Other Ambulatory Visit: Payer: Self-pay | Admitting: Cardiovascular Disease

## 2020-12-03 ENCOUNTER — Other Ambulatory Visit: Payer: Self-pay

## 2020-12-03 ENCOUNTER — Ambulatory Visit (HOSPITAL_COMMUNITY)
Admission: RE | Admit: 2020-12-03 | Discharge: 2020-12-03 | Disposition: A | Discharge status: Home / Self Care | Payer: Medicaid Other | Source: Ambulatory Visit | Attending: Internal Medicine | Admitting: Internal Medicine

## 2020-12-03 DIAGNOSIS — R600 Localized edema: Secondary | ICD-10-CM | POA: Diagnosis present

## 2020-12-03 DIAGNOSIS — M79604 Pain in right leg: Secondary | ICD-10-CM | POA: Diagnosis present

## 2020-12-03 DIAGNOSIS — M79605 Pain in left leg: Secondary | ICD-10-CM | POA: Insufficient documentation

## 2020-12-12 ENCOUNTER — Telehealth: Payer: Self-pay | Admitting: *Deleted

## 2020-12-12 MED ORDER — IPRATROPIUM-ALBUTEROL 0.5-2.5 (3) MG/3ML IN SOLN: 3.0000 mL | Freq: Four times a day (QID) | RESPIRATORY_TRACT | 1 refills | Status: DC | PRN

## 2020-12-12 MED ORDER — ALBUTEROL SULFATE HFA 108 (90 BASE) MCG/ACT IN AERS: 2.0000 | INHALATION_SPRAY | Freq: Four times a day (QID) | RESPIRATORY_TRACT | 1 refills | Status: DC | PRN

## 2020-12-12 MED ORDER — BENZONATATE 100 MG PO CAPS: 100.0000 mg | ORAL_CAPSULE | Freq: Three times a day (TID) | ORAL | 0 refills | Status: DC | PRN

## 2020-12-12 NOTE — Telephone Encounter (Deleted)
Good morning Emily [name], we will need to speak with you since you are having these symptoms and get further information. I have attempted to call you but did not get an answer and could not leave a voicemail since your mailbox is full. Please call us back at [phone]. For future reference, if you are having acute symptoms please call us rather than sending email as we allow up to 48 hours to respond to emails and we want to be able to address any symptoms quickly. Thank you.

## 2020-12-12 NOTE — Telephone Encounter (Signed)
LMTCB for pt and responded to her asking her to call back.

## 2020-12-12 NOTE — Telephone Encounter (Signed)
Mychart Message from website:  I'm on the phone on hold with y'all it takes forever to talk to someone I'm sorry I missed your call my chest is hurting now I'm out of my breathing treatment medication can y'all send a refill please.   Called and spoke with patient, she is having Chest discomfort when she lays down at night, she coughs until she urinates on herself, this has been happening for the past 3 days.  Dry cough, sometimes get some clear mucous up.  Wakes up coughing.  Denies any congestion in her head or chest.  She says she is not sick.  She says the nebulizer treatment definitely helps.  She reports she is still smoking a pack a day.  Advised her that stopping smoking is the best thing she can do to improve her breathing.  She is requesting a refill of her duoneb and would like an albuterol inhaler and some tessalon pearls for cough.  She verbalized understanding.  Emily [name], Please advise if ok to send in albuterol inhaler, refill on duoneb and Tessalon for cough.  Thank  you.

## 2020-12-12 NOTE — Telephone Encounter (Signed)
Called and spoke with patient, provided recommendations per Kandice Robinsons NP.  Advised to stop smoking.  She verbalized understanding.  Verified pharmacy and medications sent.  Scheduled f/u with Rubye Oaks NP for 12/20/20 at 2 pm, advised to arrive by 1:45 pm.  Nothing further needed.

## 2020-12-19 ENCOUNTER — Other Ambulatory Visit: Payer: Self-pay | Admitting: Adult Health

## 2020-12-20 ENCOUNTER — Other Ambulatory Visit: Payer: Self-pay

## 2020-12-20 ENCOUNTER — Encounter: Payer: Self-pay | Admitting: Adult Health

## 2020-12-20 ENCOUNTER — Ambulatory Visit: Payer: Medicaid Other | Admitting: Adult Health

## 2020-12-20 DIAGNOSIS — F17200 Nicotine dependence, unspecified, uncomplicated: Secondary | ICD-10-CM | POA: Diagnosis not present

## 2020-12-20 DIAGNOSIS — W57XXXA Bitten or stung by nonvenomous insect and other nonvenomous arthropods, initial encounter: Secondary | ICD-10-CM

## 2020-12-20 DIAGNOSIS — J42 Unspecified chronic bronchitis: Secondary | ICD-10-CM

## 2020-12-20 DIAGNOSIS — S30860A Insect bite (nonvenomous) of lower back and pelvis, initial encounter: Secondary | ICD-10-CM

## 2020-12-20 MED ORDER — IPRATROPIUM-ALBUTEROL 0.5-2.5 (3) MG/3ML IN SOLN: 3.0000 mL | Freq: Four times a day (QID) | RESPIRATORY_TRACT | 5 refills | Status: DC | PRN

## 2020-12-20 NOTE — Assessment & Plan Note (Signed)
Chronic bronchitis and a heavy smoker.  Patient is currently under good control.  She is using DuoNeb 2-3 times a day.  She declines maintenance inhaler.  Recent high-resolution CT chest earlier this year showed no evidence of interstitial lung disease.  Long discussion with patient regarding smoking cessation. Needs PFTs on return.  Plan Patient Instructions  Continue on Duoneb 2-3 times a day .  Activity as tolerated.  Mucinex DM Twice daily  As needed  Cough/cogestion .  Work on not smoking .  Clean site where Tick was at with soap and water. Seek treatment if develop rash  or fever.   Follow up with Dr. Marchelle Gearing in 6 months with PFTs and As needed

## 2020-12-20 NOTE — Progress Notes (Signed)
$'@Patient'R$  ID: Emily [name], female    DOB: [birth year], 40 y.o.   MRN: [id number]  Chief Complaint  Patient presents with   Follow-up    Referring provider: Cannon Kettle  HPI: 40 year old female active smoker (heavy smoker 1-1 and half packs per day) followed for chronic bronchitis and chronic rhinitis Medical history significant for poorly controlled diabetes, bipolar disorder, panic disorder  TEST/EVENTS :  2020  Ace - 81 Esr - 30 Rh<14 CCP<16 ANCA - negative  Hypersensitive activity pneumonitis panel negative Anti-Jo 1 less than 0.2 RNP antibodies greater than 8 Sjogren's syndrome antibodies negative Anti-scleroderma antibody less than 1 Aldolase 4.5 MPO/PR-3 (ANCA) antibodies-less than 1 Total CK 47, CK-MB less than 7 -negative     04/01/2018-CT chest high resolution- few patchy areas of mild groundglass attenuation in the lungs bilaterally nonspecific, stable pulmonary nodules from 2009    01/08/2015- spirometry- FVC 1.33 (38% predicted), ratio 72, FEV1 1.01 (37% predicted)  High-resolution CT chest July 25, 2020 bandlike scarring left base, no evidence of interstitial lung disease, mild air trapping, fine centrilobular nodularity concentrated in the lung apices consistent with smoking-related bronchiolitis  12/20/2020 Follow up : Chronic bronchitis, chronic rhinitis Patient presents for a 9-month follow-up.  Patient says her breathing has been doing the same since last visit.  She is had no flare of cough or wheezing.  She does continue to smoke very heavily.  Long discussion Regarding smoking cessation.  Patient says she is not ready to quit smoking.  We did discuss trying to cut down and slowly moving towards 10 cigarettes a day.  Currently she is smoking 1 pack to 1.5 pack/day.  Patient education on potential harmful effects of smoking. Patient denies any increased coughing or congestion.  She remains on DuoNeb most days anywhere between 2-3 times a day.   We discussed adding in a maintenance inhaler however patient says this is working well for her and she does not want to change her current regimen. High resolution CT chest was done in April of this year.  It did not show any evidence of interstitial lung disease.  Stable scarring in the left base.  And fine nodularity noted in the lung apices consistent with a smoking-related bronchiolitis.  Patient does complain that she is got a spot along her right side of her back.  That she wanted me to take a look at.  It was a small tick.  This was removed with tweezers with no apparent complications.  Whole tick was intact.  Localized redness with no rash.  She denies any fever.  Patient says she was in the emergency room yesterday for her diabetes.  Her blood sugars had gotten elevated.  She required IV fluids.   Allergies  Allergen Reactions   Bactrim [Sulfamethoxazole-Trimethoprim] Anaphylaxis and Other (See Comments)    Chest pain, trouble breathing   Ciprofloxacin Palpitations   Clindamycin/Lincomycin     Other reaction(s): Chest Pain   Flagyl [Metronidazole] Shortness Of Breath   Iodinated Diagnostic Agents Swelling   Liraglutide Nausea And Vomiting   Doxycycline Nausea And Vomiting   Penicillins    Prednisone Other (See Comments)    Makes her angry and irritable    Immunization History  Administered Date(s) Administered   Influenza Split 03/22/2014   Influenza Whole 01/13/2008   Influenza, Seasonal, Injecte, Preservative Fre 04/18/2015   Influenza,inj,Quad PF,6+ Mos 04/18/2015   PFIZER(Purple Top)SARS-COV-2 Vaccination 03/12/2020, 04/16/2020   Pneumococcal Polysaccharide-23 05/10/2012   Tdap 01/02/2006, 08/08/2015  Past Medical History:  Diagnosis Date   Abnormal CT of liver    Anxiety    Asthma    Asthma    Bipolar 1 disorder (HCC)    Bipolar disorder (HCC)    Chronic pain    Depression    Fatty liver    GERD (gastroesophageal reflux disease)    Headache(784.0)     Hyperlipidemia    Hypertension    Pulmonary nodule    Tachycardia    Thyroid disease     Tobacco History: Social History   Tobacco Use  Smoking Status Every Day   Packs/day: 1.00   Years: 20.00   Pack years: 20.00   Types: Cigarettes  Smokeless Tobacco Never   Ready to quit: No Counseling given: Yes   Outpatient Medications Prior to Visit  Medication Sig Dispense Refill   ACCU-CHEK AVIVA PLUS test strip 1 each by Other route 3 (three) times daily.     ACCU-CHEK FASTCLIX LANCETS MISC USE AS DIRECTED 102 each 3   albuterol (VENTOLIN HFA) 108 (90 Base) MCG/ACT inhaler Inhale 2 puffs into the lungs every 6 (six) hours as needed for wheezing or shortness of breath. 18 g 1   amitriptyline (ELAVIL) 25 MG tablet Take 50 mg by mouth at bedtime.     amLODipine (NORVASC) 5 MG tablet TAKE 1 TABLET (5 MG TOTAL) BY MOUTH DAILY. 90 tablet 3   benzonatate (TESSALON) 100 MG capsule Take 1 capsule (100 mg total) by mouth 3 (three) times daily as needed for cough. 30 capsule 0   benztropine (COGENTIN) 1 MG tablet Take 1 mg by mouth 2 (two) times daily.     Blood Glucose Monitoring Suppl (ACCU-CHEK AVIVA PLUS) w/Device KIT USE AS DIRECTED 1 kit 0   clonazePAM (KLONOPIN) 0.5 MG disintegrating tablet Take 0.5 mg by mouth daily as needed.     fenofibrate (TRICOR) 145 MG tablet Take 1 tablet (145 mg total) by mouth daily. 90 tablet 3   gabapentin (NEURONTIN) 300 MG capsule Take 300-600 mg by mouth at bedtime as needed.     insulin glargine (LANTUS SOLOSTAR) 100 UNIT/ML Solostar Pen Inject 26 Units into the skin at bedtime. Can increase to 40 units/day     Insulin Lispro Prot & Lispro (HUMALOG 75/25 MIX) (75-25) 100 UNIT/ML Kwikpen Inject 24 Units into the skin 2 (two) times daily.     Insulin Pen Needle 32G X 4 MM MISC 1 each by Does not apply route as needed.     lamoTRIgine (LAMICTAL) 150 MG tablet Take 150 mg by mouth daily.     levothyroxine (SYNTHROID, LEVOTHROID) 75 MCG tablet Take 1 tablet (75  mcg total) by mouth daily before breakfast. 90 tablet 3   losartan (COZAAR) 50 MG tablet Take 1 tablet (50 mg total) by mouth daily. 30 tablet 0   montelukast (SINGULAIR) 10 MG tablet TAKE 1 TABLET BY MOUTH EVERYDAY AT BEDTIME 90 tablet 1   Nebulizers (COMPRESSOR/NEBULIZER) MISC Use as directed 1 each 0   nitroGLYCERIN (NITROSTAT) 0.4 MG SL tablet Place 1 tablet (0.4 mg total) under the tongue every 5 (five) minutes as needed for chest pain. 25 tablet 6   NOVOLOG FLEXPEN 100 UNIT/ML FlexPen SMARTSIG:12 Unit(s) SUB-Q     omeprazole (PRILOSEC) 20 MG capsule Take 1 capsule (20 mg total) by mouth daily. 30 capsule 11   paroxetine mesylate (PEXEVA) 20 MG tablet Take 20 mg by mouth daily.     PAXIL 20 MG tablet Take 20 mg by  mouth daily.     propranolol (INDERAL) 20 MG tablet Take 1 tablet (20 mg total) by mouth 3 (three) times daily. 270 tablet 3   Respiratory Therapy Supplies (NEBULIZER/TUBING/MOUTHPIECE) KIT Use as directed 10 kit 11   rosuvastatin (CRESTOR) 10 MG tablet Take 1 tablet (10 mg total) by mouth daily. 90 tablet 3   ziprasidone (GEODON) 60 MG capsule Take 60 mg by mouth daily.     ipratropium-albuterol (DUONEB) 0.5-2.5 (3) MG/3ML SOLN Take 3 mLs by nebulization every 6 (six) hours as needed. 90 mL 1   cephALEXin (KEFLEX) 250 MG capsule Take 1 capsule (250 mg total) by mouth 4 (four) times daily. For 7 days (Patient not taking: Reported on 12/20/2020) 28 capsule 0   Facility-Administered Medications Prior to Visit  Medication Dose Route Frequency Provider Last Rate Last Admin   clindamycin (CLEOCIN) 2 % vaginal cream 1 Applicatorful  1 Applicatorful Vaginal QHS Jonnie Kind, MD         Review of Systems:   Constitutional:   No  weight loss, night sweats,  Fevers, chills,  +fatigue, or  lassitude.  HEENT:   No headaches,  Difficulty swallowing,  Tooth/dental problems, or  Sore throat,                No sneezing, itching, ear ache, nasal congestion, post nasal drip,   CV:  No  chest pain,  Orthopnea, PND, swelling in lower extremities, anasarca, dizziness, palpitations, syncope.   GI  No heartburn, indigestion, abdominal pain, nausea, vomiting, diarrhea, change in bowel habits, loss of appetite, bloody stools.   Resp: No shortness of breath with exertion or at rest.  No excess mucus, no productive cough,  No non-productive cough,  No coughing up of blood.  No change in color of mucus.  No wheezing.  No chest wall deformity  Skin: no rash or lesions.  GU: no dysuria, change in color of urine, no urgency or frequency.  No flank pain, no hematuria   MS:  No joint pain or swelling.  No decreased range of motion.  No back pain.    Physical Exam  BP 110/74 (BP Location: Left Arm, Patient Position: Sitting, Cuff Size: Large)   Pulse 75   Temp 97.7 F (36.5 C) (Oral)   Ht $R'5\' 3"'OW$  (1.6 m)   Wt 217 lb 6.4 oz (98.6 kg)   SpO2 95%   BMI 38.51 kg/m   GEN: A/Ox3; pleasant , NAD, well nourished    HEENT:  [address]/AT,   NOSE-clear, THROAT-clear, no lesions, no postnasal drip or exudate noted.   NECK:  Supple w/ fair ROM; no JVD; normal carotid impulses w/o bruits; no thyromegaly or nodules palpated; no lymphadenopathy.    RESP  Clear  P & A; w/o, wheezes/ rales/ or rhonchi. no accessory muscle use, no dullness to percussion  CARD:  RRR, no m/r/g, no peripheral edema, pulses intact, no cyanosis or clubbing.  GI:   Soft & nt; nml bowel sounds; no organomegaly or masses detected.   Musco: Warm bil, no deformities or joint swelling noted.   Neuro: alert, no focal deficits noted.    Skin: Warm, no lesions or rashes Along the right mid posterior back small tick noted.  And removed with tweezers without difficulty.  Area was cleansed with alcohol pad small localized redness at site of tick removal.  No surrounding rash or redness.   Lab Results:     BNP No results found for: BNP  ProBNP No  results found for: PROBNP  Imaging:    No flowsheet data  found.  Lab Results  Component Value Date   NITRICOXIDE 5 01/26/2018        Assessment & Plan:   Chronic bronchitis (HCC) Chronic bronchitis and a heavy smoker.  Patient is currently under good control.  She is using DuoNeb 2-3 times a day.  She declines maintenance inhaler.  Recent high-resolution CT chest earlier this year showed no evidence of interstitial lung disease.  Long discussion with patient regarding smoking cessation. Needs PFTs on return.  Plan Patient Instructions  Continue on Duoneb 2-3 times a day .  Activity as tolerated.  Mucinex DM Twice daily  As needed  Cough/cogestion .  Work on not smoking .  Clean site where Tick was at with soap and water. Seek treatment if develop rash  or fever.   Follow up with Dr. Chase Caller in 6 months with PFTs and As needed         TOBACCO ABUSE Smoking cessation discussed in detail.  Tick bite of back Small tic along the posterior back was removed with tweezers intact.  Advised patient to watch area for any signs of redness rash.  Or development of fever.  Keep area clean with soap and water.  Plan Patient Instructions  Continue on Duoneb 2-3 times a day .  Activity as tolerated.  Mucinex DM Twice daily  As needed  Cough/cogestion .  Work on not smoking .  Clean site where Tick was at with soap and water. Seek treatment if develop rash  or fever.   Follow up with Dr. Chase Caller in 6 months with PFTs and As needed     '     Misty Rago, NP 12/20/2020

## 2020-12-20 NOTE — Assessment & Plan Note (Signed)
Smoking cessation discussed in detail 

## 2020-12-20 NOTE — Patient Instructions (Addendum)
Continue on Duoneb 2-3 times a day .  Activity as tolerated.  Mucinex DM Twice daily  As needed  Cough/cogestion .  Work on not smoking .  Clean site where Tick was at with soap and water. Seek treatment if develop rash  or fever.   Follow up with Dr. Marchelle Gearing in 6 months with PFTs and As needed

## 2020-12-20 NOTE — Assessment & Plan Note (Signed)
Small tic along the posterior back was removed with tweezers intact.  Advised patient to watch area for any signs of redness rash.  Or development of fever.  Keep area clean with soap and water.  Plan Patient Instructions  Continue on Duoneb 2-3 times a day .  Activity as tolerated.  Mucinex DM Twice daily  As needed  Cough/cogestion .  Work on not smoking .  Clean site where Tick was at with soap and water. Seek treatment if develop rash  or fever.   Follow up with Dr. Marchelle Gearing in 6 months with PFTs and As needed     '

## 2020-12-25 DIAGNOSIS — R002 Palpitations: Secondary | ICD-10-CM | POA: Diagnosis not present

## 2021-01-02 ENCOUNTER — Ambulatory Visit (INDEPENDENT_AMBULATORY_CARE_PROVIDER_SITE_OTHER): Payer: Medicaid Other

## 2021-01-02 ENCOUNTER — Telehealth: Payer: Self-pay | Admitting: Cardiovascular Disease

## 2021-01-02 DIAGNOSIS — R002 Palpitations: Secondary | ICD-10-CM

## 2021-01-02 NOTE — Telephone Encounter (Signed)
Spoke with the Emily [name] and advised her that after Dr. Elease Hashimoto had a chance to review her My Chart message and he has ordered a 14 day monitor and she agreed. Will call back if anything changes or worsens.

## 2021-01-02 NOTE — Telephone Encounter (Signed)
See Phone note... 14 day Zio ordered.

## 2021-01-02 NOTE — Progress Notes (Unsigned)
Enrolled patient for a 14 day Zio XT  monitor to be mailed to patients home  °

## 2021-01-02 NOTE — Telephone Encounter (Signed)
Patient c/o Palpitations:  High priority if patient c/o lightheadedness, shortness of breath, or chest pain  How long have you had palpitations/irregular HR/ Afib? Are you having the symptoms now? Yesterday; yes   Are you currently experiencing lightheadedness, SOB or CP? No   Do you have a history of afib (atrial fibrillation) or irregular heart rhythm? No   Have you checked your BP or HR? (document readings if available): HR in 80's when checked today  Are you experiencing any other symptoms? A lilttle nauseated

## 2021-01-09 NOTE — Telephone Encounter (Signed)
ATC patient. LVM advising patient to call back.

## 2021-01-09 NOTE — Telephone Encounter (Signed)
Pt returning missed call [phone]

## 2021-01-10 NOTE — Telephone Encounter (Signed)
Spoke with the pt  She is c/o nasal congestion and she says her tonsils feel swollen, scratchy throat for the past 2 days She denies any increased SOB, wheezing, CP, trouble swallowing, sore throat, f/c/s  She has not tried any otc meds or taken a covid test  I advised she may need to at least take at home covid test to rule this out She reports taking all of her meds as prescribed Please advise thanks  Allergies  Allergen Reactions   Bactrim [Sulfamethoxazole-Trimethoprim] Anaphylaxis and Other (See Comments)    Chest pain, trouble breathing   Ciprofloxacin Palpitations   Clindamycin/Lincomycin     Other reaction(s): Chest Pain   Flagyl [Metronidazole] Shortness Of Breath   Iodinated Diagnostic Agents Swelling   Liraglutide Nausea And Vomiting   Doxycycline Nausea And Vomiting   Penicillins    Prednisone Other (See Comments)    Makes her angry and irritable

## 2021-01-11 NOTE — Telephone Encounter (Signed)
Agree with the message of Rikki Spearing

## 2021-01-11 NOTE — Telephone Encounter (Signed)
Started here that she is sick. Agree with COVID home test please call if positive  Please try salt water gargles, Tylenol rest and fluids. Zyrtec 10mg  At bedtime  As needed  drainage .   Please advise her that antibiotics do not work for allergies or viral infections.  And frequent use of antibiotics because antibiotic resistance

## 2021-01-16 ENCOUNTER — Other Ambulatory Visit: Payer: Self-pay | Admitting: Cardiovascular Disease

## 2021-02-12 ENCOUNTER — Encounter: Payer: Medicaid Other | Admitting: Cardiovascular Disease

## 2021-02-12 ENCOUNTER — Encounter: Payer: Self-pay | Admitting: Cardiovascular Disease

## 2021-02-12 NOTE — Progress Notes (Signed)
This encounter was created in error - please disregard.

## 2021-02-18 ENCOUNTER — Telehealth: Payer: Self-pay | Admitting: Adult Health

## 2021-02-18 ENCOUNTER — Other Ambulatory Visit: Payer: Self-pay | Admitting: Adult Health

## 2021-02-19 NOTE — Telephone Encounter (Signed)
Attempted to call pt but line went directly to VM. Unable to leave VM as mailbox is full. Will try to call back.

## 2021-02-22 MED ORDER — IPRATROPIUM-ALBUTEROL 0.5-2.5 (3) MG/3ML IN SOLN: 3.0000 mL | Freq: Four times a day (QID) | RESPIRATORY_TRACT | 5 refills | Status: DC | PRN

## 2021-03-06 ENCOUNTER — Telehealth: Payer: Self-pay | Admitting: Internal Medicine

## 2021-03-06 DIAGNOSIS — J45909 Unspecified asthma, uncomplicated: Secondary | ICD-10-CM

## 2021-03-06 MED ORDER — AZITHROMYCIN 250 MG PO TABS: ORAL_TABLET | ORAL | 0 refills | Status: DC

## 2021-03-06 NOTE — Telephone Encounter (Signed)
Ok for z pak  Reagarding nebs - nthing is "stronger" than duoneb. She could do yupelri 1/day and brovana 2x/day. This is more convenient but very expensive and not necessarily more stronger

## 2021-03-06 NOTE — Telephone Encounter (Signed)
Call made to patient, confirmed DOB. Made aware of MR recommendations. Voiced understanding. Confirmed pharmacy. Medication sent in.   Nothing further needed at this time.

## 2021-03-06 NOTE — Telephone Encounter (Signed)
Spoke with pt who states chest congestion x 3 days, chest tightness x 2 days and productive cough today. Pt states she has been using Mucniex DM and Duo Nebs without any relief. Pt is inquiring if there is any nebulizer "stronger" then Duo Nebs. Pt is also requesting Z-Pak and states that is what has been effective in the past. Pt denies any fever/chills or GI upset. Dr Marchelle Gearing please advise.

## 2021-03-06 NOTE — Telephone Encounter (Signed)
Attempted to contact patient, voicemail is full. Will try back later.  

## 2021-03-07 NOTE — Telephone Encounter (Signed)
This is fine 

## 2021-03-08 ENCOUNTER — Telehealth: Payer: Self-pay | Admitting: Pulmonary Disease

## 2021-03-08 MED ORDER — PAXLOVID (300/100) 20 X 150 MG & 10 X 100MG PO TBPK: ORAL_TABLET | ORAL | 1 refills | Status: DC

## 2021-03-08 NOTE — Telephone Encounter (Signed)
[facility] Telephone  Patient began having symptoms of cough, shortness of breath four days ago. Tested positive for COVID today. Requesting antiviral.  We discussed risks and benefits of medication under EUA. Discussed side effects. Patient expressed understanding and wished to take Paxlovid.  Script sent in.  Staff please contact patient for follow-up with Dr. Marchelle Gearing or NP in 2 weeks

## 2021-03-08 NOTE — Telephone Encounter (Signed)
MR please advise. Thanks! 

## 2021-03-11 ENCOUNTER — Telehealth: Payer: Self-pay | Admitting: Internal Medicine

## 2021-03-11 NOTE — Telephone Encounter (Signed)
Spoke with pt who states that pharmacy straightened out nebulizer tube order and will be sending nebulizer supplies to pt. Pt states nothing further needed at this time.

## 2021-03-11 NOTE — Telephone Encounter (Signed)
I have attempted to call the pt but the VM is full.  Will try back later.

## 2021-03-12 NOTE — Telephone Encounter (Signed)
Pt's neb sol was sent to pharmacy for her 11/4. Closing encounter.

## 2021-03-12 NOTE — Telephone Encounter (Signed)
I called and spoke with the pt and scheduled with MR for 2:45 on 04/03/21.

## 2021-03-14 ENCOUNTER — Telehealth: Payer: Self-pay | Admitting: Pulmonary Disease

## 2021-03-15 ENCOUNTER — Telehealth: Payer: Self-pay | Admitting: Internal Medicine

## 2021-03-15 ENCOUNTER — Other Ambulatory Visit: Payer: Self-pay | Admitting: Pulmonary Disease

## 2021-03-15 MED ORDER — AZITHROMYCIN 250 MG PO TABS: ORAL_TABLET | ORAL | 0 refills | Status: DC

## 2021-03-15 NOTE — Progress Notes (Signed)
Prescription for azithromycin called into pharmacy

## 2021-03-15 NOTE — Telephone Encounter (Signed)
Prescription for azithromycin called into pharmacy for her  Encourage to use rescue inhaler as needed, up to 4 times a day as needed

## 2021-03-15 NOTE — Telephone Encounter (Signed)
Called and spoke with pt letting her know the info stated by AO and she verbalized understanding. Nothing further needed. 

## 2021-03-15 NOTE — Telephone Encounter (Signed)
Spoke with the pt  She is c/o cough and wheezing for the past wk or so  She is coughing up green sputum  She states had fever last wk and was prescribed zpack by she finished on 03/12/21 Please advise thanks!  Allergies  Allergen Reactions   Bactrim [Sulfamethoxazole-Trimethoprim] Anaphylaxis and Other (See Comments)    Chest pain, trouble breathing   Ciprofloxacin Palpitations   Clindamycin/Lincomycin     Other reaction(s): Chest Pain   Flagyl [Metronidazole] Shortness Of Breath   Iodinated Diagnostic Agents Swelling   Liraglutide Nausea And Vomiting   Doxycycline Nausea And Vomiting   Penicillins    Prednisone Other (See Comments)    Makes her angry and irritable

## 2021-03-17 NOTE — Telephone Encounter (Signed)
Ok to get her nebulizer supplies  as she has requested  Regarding her CNS/psyc meds -> it is 10:37 PM Sunday 03/17/2021. I have been off the week. So on 03/18/21 is Monday - she should get it from her regular doc. I hae no experience in thse meds in the office setting

## 2021-03-17 NOTE — Telephone Encounter (Signed)
MR please advise thanks  

## 2021-03-18 NOTE — Telephone Encounter (Signed)
Emily Milch, MD to Emily [name]     8:45 PM Patient called at 8.40 pm saying that she has been taking all medications prescribed to her but just not feeling well. Cough, congestion persist. Not short of breath and no chest pain but just not feeling well. Lives with family. Advised that she come into the ER for assessment since she feels so poorly.   Last read by Emily [name] at 11:36 AM on 03/15/2021.  Ok to get her nebulizer supplies  as she has requested   Regarding her CNS/psyc meds -> it is 10:37 PM Sunday 03/17/2021. I have been off the week. So on 03/18/21 is Monday - she should get it from her regular doc. I hae no experience in thse meds in the office setting  I called and spoke with the pt  She is aware to contact her PCP for psych meds  She is aware that we already placed order for neb supplies  Nothing further needed

## 2021-04-02 ENCOUNTER — Telehealth: Payer: Self-pay | Admitting: Internal Medicine

## 2021-04-02 NOTE — Telephone Encounter (Signed)
Left message for patient to call back  

## 2021-04-04 ENCOUNTER — Ambulatory Visit: Payer: Medicaid Other | Admitting: Internal Medicine

## 2021-04-04 DIAGNOSIS — J45909 Unspecified asthma, uncomplicated: Secondary | ICD-10-CM

## 2021-04-08 NOTE — Telephone Encounter (Signed)
PCC's -  the patient did not want the neb machine order to go to [facility]. I apologize this was not in the order. Can this order be canceled with [facility] and resent to [facility] or [facility], etc? Thank you!

## 2021-04-09 NOTE — Telephone Encounter (Signed)
I have sent nebulizer order to [facility] today.  Will route back to triage to make sure MyChart message is closed.

## 2021-04-09 NOTE — Telephone Encounter (Signed)
Attempted to call pt to see if she ever received neb machine but unable to reach. Left message for her to return call.

## 2021-04-11 NOTE — Telephone Encounter (Signed)
See pt email from 04/04/21. Will close encounter.

## 2021-04-16 ENCOUNTER — Other Ambulatory Visit: Payer: Self-pay | Admitting: Adult Health

## 2021-04-17 MED ORDER — IPRATROPIUM-ALBUTEROL 0.5-2.5 (3) MG/3ML IN SOLN: 3.0000 mL | Freq: Four times a day (QID) | RESPIRATORY_TRACT | 5 refills | Status: DC | PRN

## 2021-04-18 NOTE — Telephone Encounter (Signed)
Refill sent in 04/17/21

## 2021-05-03 MED ORDER — MONTELUKAST SODIUM 10 MG PO TABS: ORAL_TABLET | ORAL | 3 refills | Status: DC

## 2021-05-03 NOTE — Addendum Note (Signed)
Addended by: Wyvonne Lenz on: 05/03/2021 02:42 PM   Modules accepted: Orders

## 2021-05-10 ENCOUNTER — Ambulatory Visit: Payer: Medicaid Other | Admitting: Internal Medicine

## 2021-05-16 ENCOUNTER — Telehealth: Payer: Self-pay | Admitting: Adult Health

## 2021-05-16 ENCOUNTER — Encounter: Payer: Self-pay | Admitting: Cardiovascular Disease

## 2021-05-17 NOTE — Telephone Encounter (Signed)
Thanks for letting us know about your labs.  Please reach out to PCP ordering provider to review .

## 2021-05-17 NOTE — Telephone Encounter (Signed)
Mychart message sent by pt requesting to have recent bloodwork reviewed. Pt has attached bloodwork in the review media section of the mychart message.  [name], please advise.

## 2021-05-17 NOTE — Telephone Encounter (Signed)
Please refer to mychart encounter from 1/26. 

## 2021-05-21 ENCOUNTER — Telehealth: Payer: Self-pay | Admitting: Cardiovascular Disease

## 2021-05-21 NOTE — Telephone Encounter (Signed)
Informed patient that Dr. Acie Fredrickson has already told her, through mychart, that the ordering doctor or her PCP needs to follow up with her lab results with her. Patient verbalized understanding. See Mychart for labs and Dr. Elmarie Shiley message.

## 2021-05-21 NOTE — Telephone Encounter (Signed)
Patient would like to speak to nurse about her lab results.

## 2021-05-23 ENCOUNTER — Telehealth: Payer: Self-pay | Admitting: Internal Medicine

## 2021-05-23 DIAGNOSIS — L989 Disorder of the skin and subcutaneous tissue, unspecified: Secondary | ICD-10-CM

## 2021-05-23 NOTE — Telephone Encounter (Signed)
Order has been placed. Message sent to pt letting her know this was done. Nothing further needed.

## 2021-05-23 NOTE — Telephone Encounter (Signed)
Okay to refer to dermatology for issues on the skin of her face.  This is a continuation of the epic MyChart message that would not link to pulmonary

## 2021-05-23 NOTE — Telephone Encounter (Signed)
Patient is requesting dermatology referral for bumps and brown spot on her face.   Message routed to Dr. Marchelle Gearing to advise

## 2021-05-27 ENCOUNTER — Encounter: Payer: Self-pay | Admitting: Adult Health

## 2021-05-27 ENCOUNTER — Ambulatory Visit (INDEPENDENT_AMBULATORY_CARE_PROVIDER_SITE_OTHER): Payer: Medicaid Other | Admitting: Adult Health

## 2021-05-27 ENCOUNTER — Other Ambulatory Visit: Payer: Self-pay

## 2021-05-27 ENCOUNTER — Ambulatory Visit (INDEPENDENT_AMBULATORY_CARE_PROVIDER_SITE_OTHER): Payer: Medicaid Other

## 2021-05-27 VITALS — BP 126/80 | HR 95 | Temp 98.1°F | Ht 63.0 in | Wt 219.0 lb

## 2021-05-27 DIAGNOSIS — J209 Acute bronchitis, unspecified: Secondary | ICD-10-CM

## 2021-05-27 DIAGNOSIS — F17200 Nicotine dependence, unspecified, uncomplicated: Secondary | ICD-10-CM

## 2021-05-27 DIAGNOSIS — J45909 Unspecified asthma, uncomplicated: Secondary | ICD-10-CM

## 2021-05-27 MED ORDER — AZITHROMYCIN 250 MG PO TABS: ORAL_TABLET | ORAL | 0 refills | Status: AC

## 2021-05-27 NOTE — Progress Notes (Signed)
_0  ID: Emily [name], female    DOB: 1981-03-31, 41 y.o.   MRN: [id number]  Chief Complaint  Patient presents with   Follow-up    Referring provider: Cannon Kettle  HPI: 41 year old female active smoker followed for chronic bronchitis and chronic rhinitis Medical history significant for diabetes, bipolar disorder and panic disorder  TEST/EVENTS :  2020  Ace - 81 Esr - 30 Rh<14 CCP<16 ANCA - negative  Hypersensitive activity pneumonitis panel negative Anti-Jo 1 less than 0.2 RNP antibodies greater than 8 Sjogren's syndrome antibodies negative Anti-scleroderma antibody less than 1 Aldolase 4.5 MPO/PR-3 (ANCA) antibodies-less than 1 Total CK 47, CK-MB less than 7 -negative     04/01/2018-CT chest high resolution- few patchy areas of mild groundglass attenuation in the lungs bilaterally nonspecific, stable pulmonary nodules from 2009    01/08/2015- spirometry- FVC 1.33 (38% predicted), ratio 72, FEV1 1.01 (37% predicted)   High-resolution CT chest July 25, 2020 bandlike scarring left base, no evidence of interstitial lung disease, mild air trapping, fine centrilobular nodularity concentrated in the lung apices consistent with smoking-related bronchiolitis  05/27/2021 Follow up : Chronic Bronchitis and Chronic rhinitis  Patient presents for an acute office visit .  Complains of 2 weeks of increased cough, thick mucus intermittent wheezing and shortness of breath.  Patient denies any hemoptysis, chest pain, orthopnea, edema.  Has been trying to use Tessalon Perles without much relief.  Patient does continue to smoke 1 pack a day.  Smoking cessation discussed.  Patient is not interested in cutting back on her smoking. Appetite is good with no nausea vomiting or diarrhea. Remains on DuoNeb 3 times daily.  He  Allergies  Allergen Reactions   Bactrim [Sulfamethoxazole-Trimethoprim] Anaphylaxis and Other (See Comments)    Chest pain, trouble breathing    Ciprofloxacin Palpitations   Clindamycin/Lincomycin     Other reaction(s): Chest Pain   Flagyl [Metronidazole] Shortness Of Breath   Iodinated Contrast Media Swelling   Liraglutide Nausea And Vomiting   Doxycycline Nausea And Vomiting   Penicillins    Prednisone Other (See Comments)    Makes her angry and irritable    Immunization History  Administered Date(s) Administered   Influenza Split 03/22/2014   Influenza Whole 01/13/2008   Influenza, Seasonal, Injecte, Preservative Fre 04/18/2015   Influenza,inj,Quad PF,6+ Mos 04/18/2015   PFIZER(Purple Top)SARS-COV-2 Vaccination 03/12/2020, 04/16/2020   Pneumococcal Polysaccharide-23 05/10/2012   Tdap 01/02/2006, 08/08/2015    Past Medical History:  Diagnosis Date   Abnormal CT of liver    Anxiety    Asthma    Asthma    Bipolar 1 disorder (HCC)    Bipolar disorder (HCC)    Chronic pain    Depression    Fatty liver    GERD (gastroesophageal reflux disease)    Headache(784.0)    Hyperlipidemia    Hypertension    Pulmonary nodule    Tachycardia    Thyroid disease     Tobacco History: Social History   Tobacco Use  Smoking Status Every Day   Packs/day: 1.00   Years: 20.00   Pack years: 20.00   Types: Cigarettes  Smokeless Tobacco Never  Tobacco Comments   Smoking a pack per day.   Ready to quit: No Counseling given: Yes Tobacco comments: Smoking a pack per day.   Outpatient Medications Prior to Visit  Medication Sig Dispense Refill   ACCU-CHEK AVIVA PLUS test strip 1 each by Other route 3 (three) times daily.  ACCU-CHEK FASTCLIX LANCETS MISC USE AS DIRECTED 102 each 3   albuterol (VENTOLIN HFA) 108 (90 Base) MCG/ACT inhaler Inhale 2 puffs into the lungs every 6 (six) hours as needed for wheezing or shortness of breath. 18 g 1   amitriptyline (ELAVIL) 25 MG tablet Take 50 mg by mouth at bedtime.     amLODipine (NORVASC) 5 MG tablet TAKE 1 TABLET (5 MG TOTAL) BY MOUTH DAILY. 90 tablet 3   benztropine  (COGENTIN) 1 MG tablet Take 1 mg by mouth 2 (two) times daily.     Blood Glucose Monitoring Suppl (ACCU-CHEK AVIVA PLUS) w/Device KIT USE AS DIRECTED 1 kit 0   clonazePAM (KLONOPIN) 0.5 MG disintegrating tablet Take 0.5 mg by mouth daily as needed.     fenofibrate (TRICOR) 145 MG tablet Take 1 tablet (145 mg total) by mouth daily. 90 tablet 3   gabapentin (NEURONTIN) 300 MG capsule Take 300-600 mg by mouth at bedtime as needed.     insulin glargine (LANTUS SOLOSTAR) 100 UNIT/ML Solostar Pen Inject 26 Units into the skin at bedtime. Can increase to 40 units/day     Insulin Lispro Prot & Lispro (HUMALOG 75/25 MIX) (75-25) 100 UNIT/ML Kwikpen Inject 24 Units into the skin 2 (two) times daily.     Insulin Pen Needle 32G X 4 MM MISC 1 each by Does not apply route as needed.     ipratropium-albuterol (DUONEB) 0.5-2.5 (3) MG/3ML SOLN Take 3 mLs by nebulization every 6 (six) hours as needed. 90 mL 5   lamoTRIgine (LAMICTAL) 150 MG tablet Take 150 mg by mouth daily.     levothyroxine (SYNTHROID, LEVOTHROID) 75 MCG tablet Take 1 tablet (75 mcg total) by mouth daily before breakfast. 90 tablet 3   montelukast (SINGULAIR) 10 MG tablet TAKE 1 TABLET BY MOUTH EVERYDAY AT BEDTIME 90 tablet 3   Nebulizers (COMPRESSOR/NEBULIZER) MISC Use as directed 1 each 0   nitroGLYCERIN (NITROSTAT) 0.4 MG SL tablet Place 1 tablet (0.4 mg total) under the tongue every 5 (five) minutes as needed for chest pain. 25 tablet 6   NOVOLOG FLEXPEN 100 UNIT/ML FlexPen SMARTSIG:12 Unit(s) SUB-Q     omeprazole (PRILOSEC) 20 MG capsule Take 1 capsule (20 mg total) by mouth daily. 30 capsule 11   paroxetine mesylate (PEXEVA) 20 MG tablet Take 20 mg by mouth daily.     PAXIL 20 MG tablet Take 20 mg by mouth daily.     propranolol (INDERAL) 20 MG tablet TAKE 1 TABLET BY MOUTH THREE TIMES A DAY 270 tablet 2   Respiratory Therapy Supplies (NEBULIZER/TUBING/MOUTHPIECE) KIT Use as directed 10 kit 11   rosuvastatin (CRESTOR) 10 MG tablet Take 1  tablet (10 mg total) by mouth daily. 90 tablet 3   ziprasidone (GEODON) 60 MG capsule Take 60 mg by mouth daily.     azithromycin (ZITHROMAX) 250 MG tablet Take 2 tablets today, then 1 tablet daily until gone. 6 tablet 0   losartan (COZAAR) 50 MG tablet Take 1 tablet (50 mg total) by mouth daily. 30 tablet 0   benzonatate (TESSALON) 100 MG capsule Take 1 capsule (100 mg total) by mouth 3 (three) times daily as needed for cough. (Patient not taking: Reported on 05/27/2021) 30 capsule 0   nirmatrelvir & ritonavir (PAXLOVID, 300/100,) 20 x 150 MG & 10 x 100MG TBPK Follow directions per packing for five days total (Patient not taking: Reported on 05/27/2021) 30 each 1   Facility-Administered Medications Prior to Visit  Medication Dose Route Frequency  Provider Last Rate Last Admin   clindamycin (CLEOCIN) 2 % vaginal cream 1 Applicatorful  1 Applicatorful Vaginal QHS Jonnie Kind, MD         Review of Systems:   Constitutional:   No  weight loss, night sweats,  Fevers, chills,  +fatigue, or  lassitude.  HEENT:   No headaches,  Difficulty swallowing,  Tooth/dental problems, or  Sore throat,                No sneezing, itching, ear ache,  +nasal congestion, post nasal drip,   CV:  No chest pain,  Orthopnea, PND, swelling in lower extremities, anasarca, dizziness, palpitations, syncope.   GI  No heartburn, indigestion, abdominal pain, nausea, vomiting, diarrhea, change in bowel habits, loss of appetite, bloody stools.   Resp:   No chest wall deformity  Skin: no rash or lesions.  GU: no dysuria, change in color of urine, no urgency or frequency.  No flank pain, no hematuria   MS:  No joint pain or swelling.  No decreased range of motion.  No back pain.    Physical Exam  BP 126/80 (BP Location: Left Arm, Patient Position: Sitting, Cuff Size: Large)    Pulse 95    Temp 98.1 F (36.7 C) (Oral)    Ht _0  (1.6 m)    Wt 219 lb (99.3 kg)    SpO2 92%    BMI 38.79 kg/m   GEN: A/Ox3; pleasant  , NAD, well nourished    HEENT:  [place]/AT,  NOSE-clear, THROAT-clear, no lesions, no postnasal drip or exudate noted.   NECK:  Supple w/ fair ROM; no JVD; normal carotid impulses w/o bruits; no thyromegaly or nodules palpated; no lymphadenopathy.    RESP  Clear  P & A; w/o, wheezes/ rales/ or rhonchi. no accessory muscle use, no dullness to percussion  CARD:  RRR, no m/r/g, no peripheral edema, pulses intact, no cyanosis or clubbing.  GI:   Soft & nt; nml bowel sounds; no organomegaly or masses detected.   Musco: Warm bil, no deformities or joint swelling noted.   Neuro: alert, no focal deficits noted.    Skin: Warm, no lesions or rashes    Lab Results:  CBC     BNP No results found for: BNP  ProBNP No results found for: PROBNP  Imaging: No results found.    No flowsheet data found.  Lab Results  Component Value Date   NITRICOXIDE 5 01/26/2018        Assessment & Plan:   ASTHMATIC BRONCHITIS, ACUTE Acute exacerbation.  Patient is unable to tolerate prednisone. Patient has multiple antibiotic allergies.  Says the only antibiotic that she can take is the Zithromax.  We discussed that this does have potential interactions with her antipsychotic medicines.  Patient says she recently has taken this in November with no difficulties. Patient advised of potential drug interactions. We will use azithromycin with caution. Check chest x-ray today  Plan  Patient Instructions  Zpack take as directed.  Continue on Duoneb Three times a day.  Activity as tolerated.  Mucinex DM Twice daily  As needed  Cough/cogestion .  Tessalon Three times a day  As needed  cough .  Work on not smoking .  Chest xray today  Follow up with Dr. Chase Caller in 4 months with PFTs and As needed   Please contact office for sooner follow up if symptoms do not improve or worsen or seek emergency care  TOBACCO ABUSE Smoking cessation discussed in detail.    Rexene Edison,  NP 05/27/2021

## 2021-05-27 NOTE — Assessment & Plan Note (Signed)
Smoking cessation discussed in detail 

## 2021-05-27 NOTE — Assessment & Plan Note (Signed)
Acute exacerbation.  Patient is unable to tolerate prednisone. Patient has multiple antibiotic allergies.  Says the only antibiotic that she can take is the Zithromax.  We discussed that this does have potential interactions with her antipsychotic medicines.  Patient says she recently has taken this in November with no difficulties. Patient advised of potential drug interactions. We will use azithromycin with caution. Check chest x-ray today  Plan  Patient Instructions  Zpack take as directed.  Continue on Duoneb Three times a day.  Activity as tolerated.  Mucinex DM Twice daily  As needed  Cough/cogestion .  Tessalon Three times a day  As needed  cough .  Work on not smoking .  Chest xray today  Follow up with Dr. Marchelle Gearing in 4 months with PFTs and As needed   Please contact office for sooner follow up if symptoms do not improve or worsen or seek emergency care

## 2021-05-27 NOTE — Patient Instructions (Addendum)
Zpack take as directed.  Continue on Duoneb Three times a day.  Activity as tolerated.  Mucinex DM Twice daily  As needed  Cough/cogestion .  Tessalon Three times a day  As needed  cough .  Work on not smoking .  Chest xray today  Follow up with Dr. Marchelle Gearing in 4 months with PFTs and As needed   Please contact office for sooner follow up if symptoms do not improve or worsen or seek emergency care

## 2021-05-28 ENCOUNTER — Telehealth: Payer: Self-pay | Admitting: Adult Health

## 2021-05-28 NOTE — Telephone Encounter (Signed)
Emily Needles, NP  05/28/2021 12:08 PM EST     Chest x-ray shows bronchitic changes.  No sign of pneumonia.  Continue with office visit recommendations and follow-up   Please contact office for sooner follow up if symptoms do not improve or worsen or seek emergency care    Called and spoke with patient. She verbalized understanding of results. Nothing further needed at time of call.

## 2021-05-30 NOTE — Telephone Encounter (Signed)
Decreased interstitial markings on chest x-ray that can be present during an acute bronchitis

## 2021-05-30 NOTE — Telephone Encounter (Signed)
Mychart message sent by pt: Emily [name]  P [facility] (supporting Parrett, [name] S, NP) 9 hours ago (12:25 AM)   What is bronchitic changes     [name], can you please help further elaborate on the results of pt's cxr. Thanks!

## 2021-06-11 ENCOUNTER — Other Ambulatory Visit: Payer: Self-pay | Admitting: Internal Medicine

## 2021-06-11 NOTE — Telephone Encounter (Signed)
Refill for Duoneb has been sent in. Nothing further needed at this time.

## 2021-07-08 ENCOUNTER — Encounter: Payer: Self-pay | Admitting: Cardiovascular Disease

## 2021-07-08 ENCOUNTER — Ambulatory Visit: Payer: Medicaid Other | Admitting: Nurse Practitioner

## 2021-07-08 NOTE — Telephone Encounter (Signed)
Pt c/o BP issue: STAT if pt c/o blurred vision, one-sided weakness or slurred speech ? ?1. What are your last 5 BP readings?  ?150's/90's-101 ? ?2. Are you having any other symptoms (ex. Dizziness, headache, blurred vision, passed out)? Hot flashes & face feels flush  ? ?3. What is your BP issue? Patient is calling stating that she has recently been having BP issues for the past week. She reports that she had been drinking for the past 3 months and just stopped and this is when the hypertension began. She is unsure if this is the cause of the hypertension. Is scheduled to see Elon Jester on Friday. Number on file is the best contact number. ?

## 2021-07-12 ENCOUNTER — Ambulatory Visit: Payer: Medicaid Other | Admitting: Nurse Practitioner

## 2021-07-15 NOTE — Progress Notes (Signed)
?Cardiology Office Note:   ? ?Date:  07/16/2021  ? ?ID:  Emily [name], DOB 06-02-1980, MRN [id number] ? ?PCP:  Harlene Salts, PA-C ?  ?[facility] ?Cardiologist:  Mertie Moores, MD    ? ?Referring MD: Harlene Salts, PA-C  ? ?Chief Complaint: follow-up hypertension, facial flushing ? ?History of Present Illness:   ? ?Emily [name] is a 41 y.o. female with a hx of hypertension, hyperlipidemia, diabetes, coronary artery calcifications seen on CT, tobacco abuse.  ? ?She was referred to cardiology by PCP for coronary artery calcifications seen on CT and was initially seen by Dr. Acie Fredrickson on 06/01/18. She had CT for shortness of breath concerning for unstable angina. She additionally reported episodes of chest tightness with any exertion, multiple risk factors for CAD. Cardiac catheterization was scheduled but was not completed. She was hospitalized 16 days in [place] for necrotizing fascitis in May 2021. In follow-up visits, she reported problems with her breathing, leg swelling, and uncontrolled diabetes. ? ?She was last seen in our office by Dr. Acie Fredrickson on 11/12/20 at which time her BP was low and she was feeling lightheaded. She c/o UTI and was advised to start Keflex and follow-up with PCP. Her HCTZ and potassium chloride were held due to hypotension and she was advised to follow-up in 3 months.  ? ?She recently called to report elevated BP readings. She was advised to continue current medications and to limit intake of sodium to < 2000 mg daily.  ? ?Today, she is here for follow-up of her hypertension accompanied by her father.  She reports she has consistently elevated blood pressure readings at home.  She is using a wrist cuff.  Reports she feels that her blood pressure is elevated when her face is flushed.  She spends most of the day sitting outside with neighbors.  She continues to smoke a pack of cigarettes a day. Eats canned spaghetti and meatballs frequently.  She is pretty sedentary and  does not exercise regularly. She reports chest pain that is not worse with exertion and occurs sporadically, sometimes feels worse when she is smoking. Having occasional palpitations. Chronic shortness of breath that she does not feel has worsened recently. No lower extremity edema, fatigue, diaphoresis, weakness, presyncope, syncope, orthopnea, or PND.  ? ? ?Past Medical History:  ?Diagnosis Date  ? Abnormal CT of liver   ? Anxiety   ? Asthma   ? Asthma   ? Bipolar 1 disorder ([place])   ? Bipolar disorder ([place])   ? Chronic pain   ? Depression   ? Fatty liver   ? GERD (gastroesophageal reflux disease)   ? Headache(784.0)   ? Hyperlipidemia   ? Hypertension   ? Pulmonary nodule   ? Tachycardia   ? Thyroid disease   ? ? ?Past Surgical History:  ?Procedure Laterality Date  ? DILATION AND CURETTAGE OF UTERUS    ? 1 yr ago  ? ? ?Current Medications: ?Current Meds  ?Medication Sig  ? ACCU-CHEK AVIVA PLUS test strip 1 each by Other route 3 (three) times daily.  ? ACCU-CHEK FASTCLIX LANCETS MISC USE AS DIRECTED  ? albuterol (VENTOLIN HFA) 108 (90 Base) MCG/ACT inhaler Inhale 2 puffs into the lungs every 6 (six) hours as needed for wheezing or shortness of breath.  ? amitriptyline (ELAVIL) 25 MG tablet Take 50 mg by mouth at bedtime.  ? amLODipine (NORVASC) 5 MG tablet TAKE 1 TABLET (5 MG TOTAL) BY MOUTH DAILY.  ? atorvastatin (  LIPITOR) 40 MG tablet Take 40 mg by mouth daily.  ? benztropine (COGENTIN) 1 MG tablet Take 1 mg by mouth 2 (two) times daily.  ? Blood Glucose Monitoring Suppl (ACCU-CHEK AVIVA PLUS) w/Device KIT USE AS DIRECTED  ? clonazePAM (KLONOPIN) 0.5 MG disintegrating tablet Take 0.5 mg by mouth daily as needed.  ? fenofibrate (TRICOR) 145 MG tablet Take 1 tablet (145 mg total) by mouth daily.  ? gabapentin (NEURONTIN) 300 MG capsule Take 300-600 mg by mouth at bedtime as needed.  ? HUMALOG KWIKPEN 100 UNIT/ML KwikPen SUB-Q 3 Times Daily  ? insulin glargine (LANTUS SOLOSTAR) 100 UNIT/ML Solostar Pen Inject 26  Units into the skin at bedtime. Can increase to 40 units/day  ? Insulin Lispro Prot & Lispro (HUMALOG 75/25 MIX) (75-25) 100 UNIT/ML Kwikpen Inject 24 Units into the skin 2 (two) times daily.  ? Insulin Pen Needle 32G X 4 MM MISC 1 each by Does not apply route as needed.  ? ipratropium-albuterol (DUONEB) 0.5-2.5 (3) MG/3ML SOLN TAKE 3 MLS BY NEBULIZATION EVERY 6 (SIX) HOURS AS NEEDED.  ? lamoTRIgine (LAMICTAL) 150 MG tablet Take 150 mg by mouth daily.  ? levothyroxine (SYNTHROID, LEVOTHROID) 75 MCG tablet Take 1 tablet (75 mcg total) by mouth daily before breakfast.  ? losartan (COZAAR) 50 MG tablet Take 1 tablet (50 mg total) by mouth daily.  ? montelukast (SINGULAIR) 10 MG tablet TAKE 1 TABLET BY MOUTH EVERYDAY AT BEDTIME  ? Nebulizers (COMPRESSOR/NEBULIZER) MISC Use as directed  ? nicotine (NICODERM CQ - DOSED IN MG/24 HOURS) 14 mg/24hr patch 2nd month after completing 21 mg/24 hr patches  ? nicotine (NICODERM CQ - DOSED IN MG/24 HOURS) 21 mg/24hr patch Start first  ? nicotine (NICODERM CQ - DOSED IN MG/24 HR) 7 mg/24hr patch Use for the 3rd month  ? nitroGLYCERIN (NITROSTAT) 0.4 MG SL tablet Place 1 tablet (0.4 mg total) under the tongue every 5 (five) minutes as needed for chest pain.  ? NOVOLOG FLEXPEN 100 UNIT/ML FlexPen SMARTSIG:12 Unit(s) SUB-Q  ? omeprazole (PRILOSEC) 20 MG capsule Take 1 capsule (20 mg total) by mouth daily.  ? paroxetine mesylate (PEXEVA) 20 MG tablet Take 20 mg by mouth daily.  ? PAXIL 20 MG tablet Take 20 mg by mouth daily.  ? propranolol (INDERAL) 20 MG tablet TAKE 1 TABLET BY MOUTH THREE TIMES A DAY  ? Respiratory Therapy Supplies (NEBULIZER/TUBING/MOUTHPIECE) KIT Use as directed  ? rosuvastatin (CRESTOR) 10 MG tablet Take 1 tablet (10 mg total) by mouth daily.  ? Vitamin D, Ergocalciferol, (DRISDOL) 1.25 MG (50000 UNIT) CAPS capsule Take 50,000 Units by mouth once a week.  ? ziprasidone (GEODON) 60 MG capsule Take 60 mg by mouth daily.  ? ?Current Facility-Administered Medications  for the 07/16/21 encounter (Office Visit) with Ann Maki, Lanice Schwab, NP  ?Medication  ? clindamycin (CLEOCIN) 2 % vaginal cream 1 Applicatorful  ?  ? ?Allergies:   Bactrim [sulfamethoxazole-trimethoprim], Ciprofloxacin, Clindamycin/lincomycin, Flagyl [metronidazole], Iodinated contrast media, Liraglutide, Doxycycline, Penicillins, and Prednisone  ? ?Social History  ? ?Socioeconomic History  ? Marital status: Single  ?  Spouse name: Not on file  ? Number of children: Not on file  ? Years of education: Not on file  ? Highest education level: Not on file  ?Occupational History  ? Not on file  ?Tobacco Use  ? Smoking status: Every Day  ?  Packs/day: 1.00  ?  Years: 20.00  ?  Pack years: 20.00  ?  Types: Cigarettes  ? Smokeless tobacco:  Never  ? Tobacco comments:  ?  Smoking a pack per day.  ?Vaping Use  ? Vaping Use: Not on file  ?Substance and Sexual Activity  ? Alcohol use: No  ? Drug use: No  ? Sexual activity: Not Currently  ?  Partners: Male  ?  Birth control/protection: Condom  ?Other Topics Concern  ? Not on file  ?Social History Narrative  ? Not on file  ? ?Social Determinants of Health  ? ?Financial Resource Strain: Not on file  ?Food Insecurity: Not on file  ?Transportation Needs: Not on file  ?Physical Activity: Not on file  ?Stress: Not on file  ?Social Connections: Not on file  ?  ? ?Family History: ?The patient's family history includes Asthma in her mother; Diabetes in her maternal grandmother; Hypertension in her maternal grandmother; Valvular heart disease in her father. ? ?ROS:   ?Please see the history of present illness.    ?+ flushing of face ?+ dyspnea on exertion ?All other systems reviewed and are negative. ? ?Labs/Other Studies Reviewed:   ? ?The following studies were reviewed today: ? ? ?Recent Labs: ?11/12/2020: BUN 6; Creatinine, Ser 0.81; Hemoglobin 13.8; Platelets 203; Potassium 3.3; Sodium 128  ?Recent Lipid Panel ?   ?Component Value Date/Time  ? CHOL 251 (H) 02/20/2015 0001  ? TRIG 516 (H)  02/20/2015 0001  ? HDL 29 (L) 02/20/2015 0001  ? CHOLHDL 8.7 (H) 02/20/2015 0001  ? VLDL NOT CALC 02/20/2015 0001  ? [place] NOT [place] 02/20/2015 0001  ? ? ? ?Risk Assessment/Calculations:   ?  ? ?Physical E

## 2021-07-16 ENCOUNTER — Encounter: Payer: Self-pay | Admitting: Nurse Practitioner

## 2021-07-16 ENCOUNTER — Other Ambulatory Visit: Payer: Self-pay

## 2021-07-16 ENCOUNTER — Ambulatory Visit: Payer: Medicaid Other | Admitting: Nurse Practitioner

## 2021-07-16 VITALS — BP 130/70 | HR 83 | Ht 63.0 in | Wt 211.2 lb

## 2021-07-16 DIAGNOSIS — R011 Cardiac murmur, unspecified: Secondary | ICD-10-CM

## 2021-07-16 DIAGNOSIS — Z7189 Other specified counseling: Secondary | ICD-10-CM

## 2021-07-16 DIAGNOSIS — Z79899 Other long term (current) drug therapy: Secondary | ICD-10-CM | POA: Diagnosis not present

## 2021-07-16 DIAGNOSIS — E785 Hyperlipidemia, unspecified: Secondary | ICD-10-CM | POA: Diagnosis not present

## 2021-07-16 DIAGNOSIS — R002 Palpitations: Secondary | ICD-10-CM

## 2021-07-16 DIAGNOSIS — I10 Essential (primary) hypertension: Secondary | ICD-10-CM | POA: Diagnosis not present

## 2021-07-16 DIAGNOSIS — I2510 Atherosclerotic heart disease of native coronary artery without angina pectoris: Secondary | ICD-10-CM

## 2021-07-16 DIAGNOSIS — Z720 Tobacco use: Secondary | ICD-10-CM

## 2021-07-16 DIAGNOSIS — R072 Precordial pain: Secondary | ICD-10-CM

## 2021-07-16 MED ORDER — NICOTINE 7 MG/24HR TD PT24: MEDICATED_PATCH | TRANSDERMAL | 0 refills | Status: DC

## 2021-07-16 MED ORDER — NICOTINE 14 MG/24HR TD PT24: MEDICATED_PATCH | TRANSDERMAL | 0 refills | Status: DC

## 2021-07-16 MED ORDER — NICOTINE 21 MG/24HR TD PT24: MEDICATED_PATCH | TRANSDERMAL | 0 refills | Status: DC

## 2021-07-16 NOTE — Patient Instructions (Signed)
Medication Instructions:  ? ?START NICODERM patches as directed.  ? ? ?*If you need a refill on your cardiac medications before your next appointment, please call your pharmacy* ? ? ?Lab Work: ? ?TODAY!!!!! LIPID/CMET ? ?If you have labs (blood work) drawn today and your tests are completely normal, you will receive your results only by: ?MyChart Message (if you have MyChart) OR ?A paper copy in the mail ?If you have any lab test that is abnormal or we need to change your treatment, we will call you to review the results. ? ? ?Testing/Procedures: ? ?Your physician has requested that you have an echocardiogram. Echocardiography is a painless test that uses sound waves to create images of your heart. It provides your doctor with information about the size and shape of your heart and how well your heart?s chambers and valves are working. This procedure takes approximately one hour. There are no restrictions for this procedure. ? ? ? ?Follow-Up: ?At [facility], you and your health needs are our priority.  As part of our continuing mission to provide you with exceptional heart care, we have created designated Provider Care Teams.  These Care Teams include your primary Cardiologist (physician) and Advanced Practice Providers (APPs -  Physician Assistants and Nurse Practitioners) who all work together to provide you with the care you need, when you need it. ? ?We recommend signing up for the patient portal called "MyChart".  Sign up information is provided on this After Visit Summary.  MyChart is used to connect with patients for Virtual Visits (Telemedicine).  Patients are able to view lab/test results, encounter notes, upcoming appointments, etc.  Non-urgent messages can be sent to your provider as well.   ?To learn more about what you can do with MyChart, go to [email or web address].   ? ?Your next appointment:   ?6 month(s) ? ?The format for your next appointment:   ?In Person ? ?Provider:   ?Eligha Bridegroom, NP        ? ? ?Other Instructions ? ?Please get a cuff to fit your arm to take proper blood pressure readings.  ? ?Exercise recommendations: ?Goal of exercising for at least 30 minutes a day, at least 5 times per week.  Please exercise to a moderate exertion.  This means that while exercising it is difficult to speak in full sentences, however you are not so short of breath that you feel you must stop, and not so comfortable that you can carry on a full conversation.  Exertion level should be approximately a 5/10, if 10 is the most exertion you can perform. ? ?Diet recommendations: ?Recommend a heart healthy diet such as the Mediterranean diet.  This diet consists of plant based foods, healthy fats, lean meats, olive oil.  It suggests limiting the intake of simple carbohydrates such as white breads, pastries, and pastas.  It also limits the amount of red meat, wine, and dairy products such as cheese that one should consume on a daily basis.   ?

## 2021-07-17 ENCOUNTER — Encounter: Payer: Self-pay | Admitting: Cardiovascular Disease

## 2021-07-17 LAB — COMPREHENSIVE METABOLIC PANEL
ALT: 87 IU/L — ABNORMAL HIGH (ref 0–32)
AST: 102 IU/L — ABNORMAL HIGH (ref 0–40)
Albumin/Globulin Ratio: 1.1 — ABNORMAL LOW (ref 1.2–2.2)
Albumin: 3.6 g/dL — ABNORMAL LOW (ref 3.8–4.8)
Alkaline Phosphatase: 162 IU/L — ABNORMAL HIGH (ref 44–121)
BUN/Creatinine Ratio: 6 — ABNORMAL LOW (ref 9–23)
BUN: 4 mg/dL — ABNORMAL LOW (ref 6–20)
Bilirubin Total: 0.7 mg/dL (ref 0.0–1.2)
CO2: 21 mmol/L (ref 20–29)
Calcium: 9.1 mg/dL (ref 8.7–10.2)
Chloride: 93 mmol/L — ABNORMAL LOW (ref 96–106)
Creatinine, Ser: 0.63 mg/dL (ref 0.57–1.00)
Globulin, Total: 3.4 g/dL (ref 1.5–4.5)
Glucose: 397 mg/dL — ABNORMAL HIGH (ref 70–99)
Potassium: 4 mmol/L (ref 3.5–5.2)
Sodium: 128 mmol/L — ABNORMAL LOW (ref 134–144)
Total Protein: 7 g/dL (ref 6.0–8.5)
eGFR: 116 mL/min/{1.73_m2} (ref >59)

## 2021-07-17 LAB — LIPID PANEL
Chol/HDL Ratio: 5.1 ratio — ABNORMAL HIGH (ref 0.0–4.4)
Cholesterol, Total: 185 mg/dL (ref 100–199)
HDL: 36 mg/dL — ABNORMAL LOW (ref >39)
LDL Chol Calc (NIH): 102 mg/dL — ABNORMAL HIGH (ref 0–99)
Triglycerides: 277 mg/dL — ABNORMAL HIGH (ref 0–149)
VLDL Cholesterol Cal: 47 mg/dL — ABNORMAL HIGH (ref 5–40)

## 2021-07-25 ENCOUNTER — Ambulatory Visit (HOSPITAL_COMMUNITY): Payer: Medicaid Other | Attending: Cardiovascular Disease

## 2021-07-25 DIAGNOSIS — R072 Precordial pain: Secondary | ICD-10-CM

## 2021-07-25 DIAGNOSIS — R011 Cardiac murmur, unspecified: Secondary | ICD-10-CM

## 2021-07-25 LAB — ECHOCARDIOGRAM COMPLETE
Area-P 1/2: 4.29 cm2
S' Lateral: 2.5 cm

## 2021-07-26 ENCOUNTER — Encounter: Payer: Self-pay | Admitting: Cardiovascular Disease

## 2021-08-05 NOTE — Telephone Encounter (Signed)
Called and spoke with patient due to the nature of the call.  ? ?She stated that she has had a productive cough for the past few days. The phlegm started out as green but now it is starting to get clear. She denied any SOB or fever but has noticed an increase in wheezing, especially with exertion. Denied being around anyone who has been sick recently.  ? ?She wanted to know if she could have a zpak.  ? ?Pharm[facility] is [facility] [place].  ? ?TP, can you please advise?  ?

## 2021-08-05 NOTE — Telephone Encounter (Signed)
Called and spoke with pt and have scheduled her  video visit with Dr.O 4/18. Nothing further needed. ?

## 2021-08-06 ENCOUNTER — Telehealth: Payer: Medicaid Other | Admitting: Pulmonary Disease

## 2021-08-06 NOTE — Telephone Encounter (Signed)
Received the following message from patient:  ? ?"Is there anyway I can do a telephone visit I'm not gonna be able to do a video visit please and thank you" ? ?She is scheduled for a MyChart visit this afternoon at 315pm.  ? ?Dr. Wynona Neat, please advise. Thanks!  ?

## 2021-08-12 ENCOUNTER — Ambulatory Visit: Payer: Medicaid Other | Admitting: Internal Medicine

## 2021-08-12 NOTE — Progress Notes (Deleted)
Name: Emily [name]  MRN/ DOB: [id number], 12-Jan-1981   Age/ Sex: 41 y.o., female    PCP: Harlene Salts, PA-C   Reason for Endocrinology Evaluation: Type {NUMBERS 1 OR 2:522190} Diabetes Mellitus     Date of Initial Endocrinology Visit: 08/12/2021     PATIENT IDENTIFIER: Emily [name] is a 41 y.o. female with a past medical history of T2DM, Dyslipidemia , Hypothyroidism , Bipolar d/o, and HTN. The patient presented for initial endocrinology clinic visit on 08/12/2021 for consultative assistance with her diabetes management.    HPI: Emily [name] was    Diagnosed with DM *** Prior Medications tried/Intolerance: intolerant to victoza  Currently checking blood sugars *** x / day,  before breakfast and ***.  Hypoglycemia episodes : ***               Symptoms: ***                 Frequency: ***/  Hemoglobin A1c has ranged from *** in ***, peaking at *** in ***. Patient required assistance for hypoglycemia:  Patient has required hospitalization within the last 1 year from hyper or hypoglycemia:   In terms of diet, the patient ***   HOME DIABETES REGIMEN: Lantus  Humalog    Statin: {Yes/No:11203} ACE-I/ARB: {YES/NO:17245} Prior Diabetic Education: {Yes/No:11203}   METER DOWNLOAD SUMMARY: Date range evaluated: *** Fingerstick Blood Glucose Tests = *** Average Number Tests/Day = *** Overall Mean FS Glucose = *** Standard Deviation = ***  BG Ranges: Low = *** High = ***   Hypoglycemic Events/30 Days: BG < 50 = *** Episodes of symptomatic severe hypoglycemia = ***   DIABETIC COMPLICATIONS: Microvascular complications:  *** Denies: *** Last eye exam: Completed   Macrovascular complications:  *** Denies: CAD, PVD, CVA   PAST HISTORY: Past Medical History:  Past Medical History:  Diagnosis Date   Abnormal CT of liver    Anxiety    Asthma    Asthma    Bipolar 1 disorder (HCC)    Bipolar disorder (HCC)    Chronic pain    Depression    Fatty liver     GERD (gastroesophageal reflux disease)    Headache(784.0)    Hyperlipidemia    Hypertension    Pulmonary nodule    Tachycardia    Thyroid disease    Past Surgical History:  Past Surgical History:  Procedure Laterality Date   DILATION AND CURETTAGE OF UTERUS     1 yr ago    Social History:  reports that she has been smoking cigarettes. She has a 20.00 pack-year smoking history. She has never used smokeless tobacco. She reports that she does not drink alcohol and does not use drugs. Family History:  Family History  Problem Relation Age of Onset   Asthma Mother    Valvular heart disease Father        Mitral valve repair Dr. Roxy Manns 2007   Diabetes Maternal Grandmother    Hypertension Maternal Grandmother      HOME MEDICATIONS: Allergies as of 08/12/2021       Reactions   Bactrim [sulfamethoxazole-trimethoprim] Anaphylaxis, Other (See Comments)   Chest pain, trouble breathing   Ciprofloxacin Palpitations   Clindamycin/lincomycin    Other reaction(s): Chest Pain   Flagyl [metronidazole] Shortness Of Breath   Iodinated Contrast Media Swelling   Liraglutide Nausea And Vomiting   Doxycycline Nausea And Vomiting   Penicillins    Prednisone Other (See Comments)   Makes her angry  and irritable        Medication List        Accurate as of August 12, 2021 12:56 PM. If you have any questions, ask your nurse or doctor.          Accu-Chek Aviva Plus test strip Generic drug: glucose blood 1 each by Other route 3 (three) times daily.   Accu-Chek Aviva Plus w/Device Kit USE AS DIRECTED   Accu-Chek FastClix Lancets Misc USE AS DIRECTED   albuterol 108 (90 Base) MCG/ACT inhaler Commonly known as: VENTOLIN HFA Inhale 2 puffs into the lungs every 6 (six) hours as needed for wheezing or shortness of breath.   amitriptyline 25 MG tablet Commonly known as: ELAVIL Take 50 mg by mouth at bedtime.   amLODipine 5 MG tablet Commonly known as: NORVASC TAKE 1 TABLET (5 MG  TOTAL) BY MOUTH DAILY.   atorvastatin 40 MG tablet Commonly known as: LIPITOR Take 40 mg by mouth daily.   benztropine 1 MG tablet Commonly known as: COGENTIN Take 1 mg by mouth 2 (two) times daily.   clonazePAM 0.5 MG disintegrating tablet Commonly known as: KLONOPIN Take 0.5 mg by mouth daily as needed.   Compressor/Nebulizer Misc Use as directed   fenofibrate 145 MG tablet Commonly known as: Tricor Take 1 tablet (145 mg total) by mouth daily.   gabapentin 300 MG capsule Commonly known as: NEURONTIN Take 300-600 mg by mouth at bedtime as needed.   HumaLOG KwikPen 100 UNIT/ML KwikPen Generic drug: insulin lispro SUB-Q 3 Times Daily   Insulin Lispro Prot & Lispro (75-25) 100 UNIT/ML Kwikpen Commonly known as: HUMALOG 75/25 MIX Inject 24 Units into the skin 2 (two) times daily.   Insulin Pen Needle 32G X 4 MM Misc 1 each by Does not apply route as needed.   ipratropium-albuterol 0.5-2.5 (3) MG/3ML Soln Commonly known as: DUONEB TAKE 3 MLS BY NEBULIZATION EVERY 6 (SIX) HOURS AS NEEDED.   lamoTRIgine 150 MG tablet Commonly known as: LAMICTAL Take 150 mg by mouth daily.   Lantus SoloStar 100 UNIT/ML Solostar Pen Generic drug: insulin glargine Inject 26 Units into the skin at bedtime. Can increase to 40 units/day   levothyroxine 75 MCG tablet Commonly known as: SYNTHROID Take 1 tablet (75 mcg total) by mouth daily before breakfast.   losartan 50 MG tablet Commonly known as: COZAAR Take 1 tablet (50 mg total) by mouth daily.   montelukast 10 MG tablet Commonly known as: SINGULAIR TAKE 1 TABLET BY MOUTH EVERYDAY AT BEDTIME   Nebulizer/Tubing/Mouthpiece Kit Use as directed   nicotine 21 mg/24hr patch Commonly known as: NICODERM CQ - dosed in mg/24 hours Start first   nicotine 14 mg/24hr patch Commonly known as: NICODERM CQ - dosed in mg/24 hours 2nd month after completing 21 mg/24 hr patches   nicotine 7 mg/24hr patch Commonly known as: NICODERM CQ -  dosed in mg/24 hr Use for the 3rd month   nitroGLYCERIN 0.4 MG SL tablet Commonly known as: NITROSTAT Place 1 tablet (0.4 mg total) under the tongue every 5 (five) minutes as needed for chest pain.   NovoLOG FlexPen 100 UNIT/ML FlexPen Generic drug: insulin aspart SMARTSIG:12 Unit(s) SUB-Q   omeprazole 20 MG capsule Commonly known as: PRILOSEC Take 1 capsule (20 mg total) by mouth daily.   paroxetine mesylate 20 MG tablet Commonly known as: PEXEVA Take 20 mg by mouth daily.   Paxil 20 MG tablet Generic drug: PARoxetine Take 20 mg by mouth daily.   propranolol 20  MG tablet Commonly known as: INDERAL TAKE 1 TABLET BY MOUTH THREE TIMES A DAY   rosuvastatin 10 MG tablet Commonly known as: CRESTOR Take 1 tablet (10 mg total) by mouth daily.   Vitamin D (Ergocalciferol) 1.25 MG (50000 UNIT) Caps capsule Commonly known as: DRISDOL Take 50,000 Units by mouth once a week.   ziprasidone 60 MG capsule Commonly known as: GEODON Take 60 mg by mouth daily.         ALLERGIES: Allergies  Allergen Reactions   Bactrim [Sulfamethoxazole-Trimethoprim] Anaphylaxis and Other (See Comments)    Chest pain, trouble breathing   Ciprofloxacin Palpitations   Clindamycin/Lincomycin     Other reaction(s): Chest Pain   Flagyl [Metronidazole] Shortness Of Breath   Iodinated Contrast Media Swelling   Liraglutide Nausea And Vomiting   Doxycycline Nausea And Vomiting   Penicillins    Prednisone Other (See Comments)    Makes her angry and irritable     REVIEW OF SYSTEMS: A comprehensive ROS was conducted with the patient and is negative except as per HPI and below:  ROS    OBJECTIVE:   VITAL SIGNS: There were no vitals taken for this visit.   PHYSICAL EXAM:  General: Pt appears well and is in NAD  Neck: General: Supple without adenopathy or carotid bruits. Thyroid: Thyroid size normal.  No goiter or nodules appreciated. No thyroid bruit.  Lungs: Clear with good BS bilat with no  rales, rhonchi, or wheezes  Heart: RRR with normal S1 and S2 and no gallops; no murmurs; no rub  Abdomen: Normoactive bowel sounds, soft, nontender, without masses or organomegaly palpable  Extremities:  Lower extremities - No pretibial edema. No lesions.  Neuro: MS is good with appropriate affect, pt is alert and Ox3    DM foot exam:    DATA REVIEWED:  Lab Results  Component Value Date   HGBA1C 6.2 (H) 02/20/2015   Lab Results  Component Value Date   LDLCALC 102 (H) 07/16/2021   CREATININE 0.63 07/16/2021     Lab Results  Component Value Date   CHOL 185 07/16/2021   HDL 36 (L) 07/16/2021   LDLCALC 102 (H) 07/16/2021   TRIG 277 (H) 07/16/2021   CHOLHDL 5.1 (H) 07/16/2021        Latest Reference Range & Units 07/16/21 14:12  Sodium 134 - 144 mmol/L 128 (L)  Potassium 3.5 - 5.2 mmol/L 4.0  Chloride 96 - 106 mmol/L 93 (L)  CO2 20 - 29 mmol/L 21  Glucose 70 - 99 mg/dL 397 (H)  BUN 6 - 20 mg/dL 4 (L)  Creatinine 0.57 - 1.00 mg/dL 0.63  Calcium 8.7 - 10.2 mg/dL 9.1  BUN/Creatinine Ratio 9 - 23  6 (L)  eGFR >59 mL/min/1.73 116  Alkaline Phosphatase 44 - 121 IU/L 162 (H)  Albumin 3.8 - 4.8 g/dL 3.6 (L)  Albumin/Globulin Ratio 1.2 - 2.2  1.1 (L)  AST 0 - 40 IU/L 102 (H)  ALT 0 - 32 IU/L 87 (H)  Total Protein 6.0 - 8.5 g/dL 7.0  Total Bilirubin 0.0 - 1.2 mg/dL 0.7  Total CHOL/HDL Ratio 0.0 - 4.4 ratio 5.1 (H)  Cholesterol, Total 100 - 199 mg/dL 185  HDL Cholesterol >39 mg/dL 36 (L)  Triglycerides 0 - 149 mg/dL 277 (H)  VLDL Cholesterol Cal 5 - 40 mg/dL 47 (H)  LDL Chol Calc (NIH) 0 - 99 mg/dL 102 (H)    ASSESSMENT / PLAN / RECOMMENDATIONS:   1) Type *** Diabetes Mellitus, ***controlled,  With*** complications - Most recent A1c of *** %. Goal A1c < *** %.  ***  Plan: GENERAL: ***  MEDICATIONS: ***  EDUCATION / INSTRUCTIONS: BG monitoring instructions: Patient is instructed to check her blood sugars *** times a day, ***. Call [facility] [facility] if:  BG persistently < 70 or > 300. I reviewed the Rule of 15 for the treatment of hypoglycemia in detail with the patient. Literature supplied.   2) Diabetic complications:  Eye: Does *** have known diabetic retinopathy.  Neuro/ Feet: Does *** have known diabetic peripheral neuropathy. Renal: Patient does *** have known baseline CKD. She is *** on an ACEI/ARB at present.Check urine albumin/creatinine ratio yearly starting at time of diagnosis. If albuminuria is positive, treatment is geared toward better glucose, blood pressure control and use of ACE inhibitors or ARBs. Monitor electrolytes and creatinine once to twice yearly.   3) Lipids: Patient is *** on a statin.    4) Hypertension: ***  at goal of < 140/90 mmHg.       Signed electronically by: Mack Guise, MD  [facility]  [facility] [address], [place], [place] [address] Phone: [phone] FAX: [phone]   CC: [place] [address] Phone: [phone]  Fax: [phone]    Return to Endocrinology clinic as below: Future Appointments  Date Time Provider [place]  08/12/2021  1:20 PM Atavia Poppe, Melanie Crazier, MD [facility] None  12/17/2021  1:30 PM Swinyer, Lanice Schwab, NP [facility]

## 2021-08-31 ENCOUNTER — Telehealth: Payer: Self-pay | Admitting: Pulmonary Disease

## 2021-08-31 MED ORDER — AZITHROMYCIN 250 MG PO TABS: ORAL_TABLET | ORAL | 0 refills | Status: DC

## 2021-08-31 NOTE — Telephone Encounter (Signed)
[facility] Telephone Call ?41 year old female with chronic bronchitis who calls in for acute call on weekend line. ?She reports two days cough, chest congestion, headache.  ?Denies fevers, changes in smell/taste, wheezing. ? ?Advised patient to contact office for appointment if symptoms do not resolve with antibiotics. Also reminding patient that geodon and azithromycin does have drug-drug interaction. She reports she has tolerated this combination in the past without issue. Advised to take with caution. We discussed red flags that would warrant urgent evaluation in the ED. She expressed understanding and agreed to plan. ? ?Asthmatic bronchitis exacerbation ?--Zpack ? ?Staff, please schedule patient with NP Par[place] or Dr. Marchelle Gearing for follow-up next month ?

## 2021-09-02 NOTE — Telephone Encounter (Signed)
Called the pt and there was no answer- LMTCB    

## 2021-09-03 NOTE — Telephone Encounter (Signed)
Spoke with the pt and scheduled appt with MR for 09/18/21.  ?

## 2021-09-05 ENCOUNTER — Other Ambulatory Visit: Payer: Self-pay | Admitting: Adult Health

## 2021-09-05 DIAGNOSIS — J45909 Unspecified asthma, uncomplicated: Secondary | ICD-10-CM

## 2021-09-05 MED ORDER — IPRATROPIUM-ALBUTEROL 0.5-2.5 (3) MG/3ML IN SOLN: 3.0000 mL | Freq: Four times a day (QID) | RESPIRATORY_TRACT | 5 refills | Status: DC | PRN

## 2021-09-06 ENCOUNTER — Other Ambulatory Visit: Payer: Self-pay | Admitting: Internal Medicine

## 2021-09-06 DIAGNOSIS — J45909 Unspecified asthma, uncomplicated: Secondary | ICD-10-CM

## 2021-09-06 MED ORDER — IPRATROPIUM-ALBUTEROL 0.5-2.5 (3) MG/3ML IN SOLN: 3.0000 mL | Freq: Three times a day (TID) | RESPIRATORY_TRACT | 11 refills | Status: DC

## 2021-09-06 NOTE — Addendum Note (Signed)
Addended by: Lavetta Nielsen L on: 09/06/2021 02:14 PM   Modules accepted: Orders

## 2021-09-08 ENCOUNTER — Telehealth: Payer: Self-pay | Admitting: Pulmonary Disease

## 2021-09-08 NOTE — Telephone Encounter (Signed)
Weekend Call Note  Patient called about on going chest congestion since taking a Zpak last week. Her breathing is better overall, she denies wheezing or dyspnea. She denies sinus congestion or pressure. She has appointment with Dr. Marchelle Gearing on 5/31.  Plan: - no further need for antibiotic at this time - continue current inhaler and nebulizer treatment plan  Melody Comas, MD [facility] Office: [phone]   See Amion for personal pager PCCM on call pager [phone] until 7pm. Please call [place] 7p-7a. [phone]

## 2021-09-18 ENCOUNTER — Ambulatory Visit: Payer: Medicaid Other | Admitting: Internal Medicine

## 2021-09-30 ENCOUNTER — Encounter: Payer: Self-pay | Admitting: Cardiovascular Disease

## 2021-10-13 ENCOUNTER — Other Ambulatory Visit: Payer: Self-pay | Admitting: Cardiovascular Disease

## 2021-10-14 ENCOUNTER — Encounter: Payer: Self-pay | Admitting: Cardiovascular Disease

## 2021-10-14 MED ORDER — PROPRANOLOL HCL 20 MG PO TABS: 20.0000 mg | ORAL_TABLET | Freq: Three times a day (TID) | ORAL | 2 refills | Status: DC

## 2021-10-23 ENCOUNTER — Encounter: Payer: Self-pay | Admitting: Cardiovascular Disease

## 2021-10-23 NOTE — Telephone Encounter (Signed)
Error

## 2021-10-24 ENCOUNTER — Ambulatory Visit: Payer: Medicaid Other | Admitting: Cardiovascular Disease

## 2021-10-24 ENCOUNTER — Encounter: Payer: Self-pay | Admitting: Cardiovascular Disease

## 2021-10-24 VITALS — BP 138/78 | HR 93 | Ht 63.0 in | Wt 197.8 lb

## 2021-10-24 DIAGNOSIS — R072 Precordial pain: Secondary | ICD-10-CM | POA: Diagnosis not present

## 2021-10-24 MED ORDER — PREDNISONE 50 MG PO TABS
ORAL_TABLET | ORAL | 0 refills | Status: DC
Start: 2021-10-24 — End: 2021-12-18

## 2021-10-24 MED ORDER — DIPHENHYDRAMINE HCL 50 MG PO TABS: 50.0000 mg | ORAL_TABLET | Freq: Once | ORAL | 0 refills | Status: DC

## 2021-10-24 NOTE — Patient Instructions (Addendum)
Medication Instructions:  Your physician recommends that you continue on your current medications as directed. Please refer to the Current Medication list given to you today.  *If you need a refill on your cardiac medications before your next appointment, please call your pharmacy*   Lab Work: Bmet If you have labs (blood work) drawn today and your tests are completely normal, you will receive your results only by: MyChart Message (if you have MyChart) OR A paper copy in the mail If you have any lab test that is abnormal or we need to change your treatment, we will call you to review the results.   Testing/Procedures: Your physician has requested that you have cardiac CT. Cardiac computed tomography (CT) is a painless test that uses an x-ray machine to take clear, detailed pictures of your heart. For further information please visit [email or web address] Please follow instruction sheet as given.     Follow-Up: At [facility], you and your health needs are our priority.  As part of our continuing mission to provide you with exceptional heart care, we have created designated Provider Care Teams.  These Care Teams include your primary Cardiologist (physician) and Advanced Practice Providers (APPs -  Physician Assistants and Nurse Practitioners) who all work together to provide you with the care you need, when you need it.   Your next appointment:   1 year(s)  The format for your next appointment:   In Person  Provider:   Kristeen Miss, MD     Other Instructions   Your cardiac CT will be scheduled at one of the below locations:   [facility] [address] [place], [place] [address] [phone]   Please arrive at the [facility] and Children's Entrance (Entrance C2) of [facility] 30 minutes prior to test start time. You can use the FREE valet parking offered at entrance C (encouraged to control the heart rate for the test)  Proceed to the [facility]  Radiology Department (first floor) to check-in and test prep.  All radiology patients and guests should use entrance C2 at [facility], accessed from [facility], even though the hospital's physical address listed is [address].     Please follow these instructions carefully (unless otherwise directed):   On the Night Before the Test: Be sure to Drink plenty of water. Do not consume any caffeinated/decaffeinated beverages or chocolate 12 hours prior to your test. Do not take any antihistamines 12 hours prior to your test. If the patient has contrast allergy: Patient will need a prescription for Prednisone and very clear instructions (as follows): Prednisone 50 mg - take 13 hours prior to test Take another Prednisone 50 mg 7 hours prior to test Take another Prednisone 50 mg 1 hour prior to test Take Benadryl 50 mg 1 hour prior to test Patient must complete all four doses of above prophylactic medications. Patient will need a ride after test due to Benadryl.  On the Day of the Test: Drink plenty of water until 1 hour prior to the test. Do not eat any food 4 hours prior to the test. You may take your regular medications prior to the test.  Take metoprolol (Lopressor) two hours prior to test. HOLD Furosemide/Hydrochlorothiazide morning of the test. FEMALES- please wear underwire-free bra if available, avoid dresses & tight clothing      After the Test: Drink plenty of water. After receiving IV contrast, you may experience a mild flushed feeling. This is normal. On  occasion, you may experience a mild rash up to 24 hours after the test. This is not dangerous. If this occurs, you can take Benadryl 25 mg and increase your fluid intake. If you experience trouble breathing, this can be serious. If it is severe call 911 IMMEDIATELY. If it is mild, please call our office. If you take any of these medications: Glipizide/Metformin, Avandament, Glucavance, please  do not take 48 hours after completing test unless otherwise instructed.  We will call to schedule your test 2-4 weeks out understanding that some insurance companies will need an authorization prior to the service being performed.   For non-scheduling related questions, please contact the cardiac imaging nurse navigator should you have any questions/concerns: Rockwell Alexandria, Cardiac Imaging Nurse Navigator Larey Brick, Cardiac Imaging Nurse Navigator [facility] Direct Office Dial: [phone]   For scheduling needs, including cancellations and rescheduling, please call [place], [phone].   Important Information About Sugar

## 2021-10-24 NOTE — Progress Notes (Signed)
Cardiology Office Note:    Date:  10/24/2021   ID:  COMFORT IVERSEN, DOB 1980-04-24, MRN [id number]  PCP:  Harlene Salts, PA-C  Cardiologist:  Mertie Moores, MD  Electrophysiologist:  None   Problem list 1.  Coronary artery calcifications 2.  Hyperlipidemia 3.  Hypertension 4.  Hypothyroidism 5.  Diabetes mellitus  Referring MD: Harlene Salts, PA-C   Chief Complaint  Patient presents with   Chest Pain     Feb. 11, 2020    Emily [name] is a 41 y.o. female with a hx of hypertension and hyperlipidemia.   Was seen with her sister Emily [name].   She recently had a CT scan of the lungs and was incidentally found to have coronary artery calcifications. We are asked to see her today by Harlene Salts, PA-C for further evaluations of these coronary artery calcifications   Emily [name] had a CT scan for further evaluation of some shortness of breath.  The shortness of breath seems to be worsening over the past several months.  She was incidentally found to have coronary artery calcifications.  Has occasional chest tightness/ pressure like sensation  with exertion.     She has these episodes of tightness whenever she does anything exertional such as climbing stairs or walking from the parking lot into a store. The pain is described as a pressure-like pain or a tightness.  It last for as long as she is walking and then resolves after several minutes after she stops walking.  She is never had any prolonged episodes.  Does not do any regular exercise   Eats lots of junk food.    Does not pay attention to her diet Goes to [employer] lots.    Eats late at night  is not working  - trying to get disability .    + family hx of CAD  - mother and father both had cad   March 05, 2019:  Emily [name]  seen today for follow-up visit.  I saw her earlier this year.  She had a history of coronary artery calcifications and had symptoms that were worrisome for unstable angina.  She has a strong  family history of coronary disease and has multiple risk factors including hyperlipidemia and cigarette smoking.  I saw her in Jan.   For angina . Has lots of CP with anxiety. No exercise per se.  Complains of lung issues.   Still smoking .  She is not having any further episodes of angina.  She admits that she is very stressed out.  Complains of 4 dimples in her leg.  She complains of leg swelling.  She admits that she eats salty foods.  She likes soup.  Still eats some salty foods.   Sees pulmonary   November 28, 2019:  No complaints,  Here for follow up   No further episodes of leg edema  Still smoking   Had necrotizing fascitis in May  - was hospitalized for 16 days in [place] in  [place]  No further cp  Has occasional palpitations .    August 03, 2020: Emily [name] is seen for follow up  Labs drawn at a [facility] facility May 17, 2020 show a triglyceride level of 320, HDL is 26, LDL is 114.  Total cholesterol is 196.  Wt is 220 lbs .  HbA1C  =  Not done this year  Has diabetes.  No CP ,  Has some occasional heart palpitations.   Has been walking  some .  Still eats lots of bread  Drinks 2-3-[name] a day    November 12, 2020;  Emily [name] is seen today for follow up of HTN, CP, leg edema    She had some chest discomfort when I saw her in April.  A Lexiscan Myoview study revealed no evidence of ischemia.  There is no evidence of previous infarction. She did have some ant. Apical attenuation , thought to be breast attenuation  She presented to the ED this am complaining of feeling like she was going to pass out Dry mouth,  felt dizzy  WBC is 12.4 UA shows hazy urine, + nitrites, + WBC Glucose is elevated.   She has diabetes which is very poorly controlled  Still smokes ,  1 ppd ,  Drinks 2-3 [name] a day   Does not cook,  does not know how to cook - I gave her a quick lesson on baking chicken . Has lost her potassium bottle , potassium was low on this  mornings blood  Has started back on insulin  - just started a week or so  Has started going to [employer].   October 24, 2021  Emily [name] is seen today for follow up visit Still smoking  Complains of chest pain Has complained of CP frequently Several negative myoview studies   We have scheduled her for coronary CTA on her before ( she has been a no show several times in the past, says she will go to get the CTA )  And leg edema  Trying to watch her salt  Walks at her appt - 10 - 15 min each day  Has lost 20 lbs  Wt is 197 lbs   Drinks 6 [name] ( 12 oz  bottles ) a day  We discussed today the fact that she should drink unsweetened tea. She has 1+ edema of her left leg.  We have done a venous duplex scan last year.  She was found have chronic venous insufficiency of her left leg.  We discussed the fact that this will improve with weight loss, exercise, compression hose, leg elevation.  We discussed smoking cessation     Past Medical History:  Diagnosis Date   Abnormal CT of liver    Anxiety    Asthma    Asthma    Bipolar 1 disorder (HCC)    Bipolar disorder (HCC)    Chronic pain    Depression    Fatty liver    GERD (gastroesophageal reflux disease)    Headache(784.0)    Hyperlipidemia    Hypertension    Pulmonary nodule    Tachycardia    Thyroid disease     Past Surgical History:  Procedure Laterality Date   DILATION AND CURETTAGE OF UTERUS     1 yr ago    Current Medications: Current Meds  Medication Sig   ACCU-CHEK AVIVA PLUS test strip 1 each by Other route 3 (three) times daily.   ACCU-CHEK FASTCLIX LANCETS MISC USE AS DIRECTED   albuterol (VENTOLIN HFA) 108 (90 Base) MCG/ACT inhaler Inhale 2 puffs into the lungs every 6 (six) hours as needed for wheezing or shortness of breath.   amitriptyline (ELAVIL) 25 MG tablet Take 50 mg by mouth at bedtime.   amLODipine (NORVASC) 5 MG tablet TAKE 1 TABLET (5 MG TOTAL) BY MOUTH DAILY.   atorvastatin (LIPITOR) 40 MG  tablet Take 40 mg by mouth daily.   benztropine (COGENTIN) 1 MG tablet Take 1  mg by mouth 2 (two) times daily.   Blood Glucose Monitoring Suppl (ACCU-CHEK AVIVA PLUS) w/Device KIT USE AS DIRECTED   clonazePAM (KLONOPIN) 0.5 MG disintegrating tablet Take 0.5 mg by mouth daily as needed.   diphenhydrAMINE (BENADRYL) 50 MG tablet Take 1 tablet (50 mg total) by mouth once for 1 dose. Take one hour prior to test.   fenofibrate (TRICOR) 145 MG tablet Take 1 tablet (145 mg total) by mouth daily.   gabapentin (NEURONTIN) 300 MG capsule Take 300-600 mg by mouth at bedtime as needed.   HUMALOG KWIKPEN 100 UNIT/ML KwikPen SUB-Q 3 Times Daily   insulin glargine (LANTUS SOLOSTAR) 100 UNIT/ML Solostar Pen Inject 26 Units into the skin at bedtime. Can increase to 40 units/day   Insulin Lispro Prot & Lispro (HUMALOG 75/25 MIX) (75-25) 100 UNIT/ML Kwikpen Inject 24 Units into the skin 2 (two) times daily.   Insulin Pen Needle 32G X 4 MM MISC 1 each by Does not apply route as needed.   ipratropium-albuterol (DUONEB) 0.5-2.5 (3) MG/3ML SOLN Take 3 mLs by nebulization in the morning, at noon, and at bedtime.   lamoTRIgine (LAMICTAL) 150 MG tablet Take 150 mg by mouth daily.   levothyroxine (SYNTHROID, LEVOTHROID) 75 MCG tablet Take 1 tablet (75 mcg total) by mouth daily before breakfast.   losartan (COZAAR) 50 MG tablet Take 1 tablet (50 mg total) by mouth daily.   montelukast (SINGULAIR) 10 MG tablet TAKE 1 TABLET BY MOUTH EVERYDAY AT BEDTIME   Nebulizers (COMPRESSOR/NEBULIZER) MISC Use as directed   nicotine (NICODERM CQ - DOSED IN MG/24 HOURS) 14 mg/24hr patch 2nd month after completing 21 mg/24 hr patches   nicotine (NICODERM CQ - DOSED IN MG/24 HOURS) 21 mg/24hr patch Start first   nicotine (NICODERM CQ - DOSED IN MG/24 HR) 7 mg/24hr patch Use for the 3rd month   nitroGLYCERIN (NITROSTAT) 0.4 MG SL tablet Place 1 tablet (0.4 mg total) under the tongue every 5 (five) minutes as needed for chest pain.   NOVOLOG  FLEXPEN 100 UNIT/ML FlexPen SMARTSIG:12 Unit(s) SUB-Q   omeprazole (PRILOSEC) 40 MG capsule Take 40 mg by mouth every morning.   paroxetine mesylate (PEXEVA) 20 MG tablet Take 20 mg by mouth daily.   PAXIL 20 MG tablet Take 20 mg by mouth daily.   predniSONE (DELTASONE) 50 MG tablet Take 1 tablet 13 hours prior to test. Take 1 tablet 7 hours prior to test. Take 1 tablet 1 hour prior to test.   promethazine (PHENERGAN) 12.5 MG tablet Take 12.5 mg by mouth every 8 (eight) hours as needed.   propranolol (INDERAL) 20 MG tablet Take 1 tablet (20 mg total) by mouth 3 (three) times daily.   Respiratory Therapy Supplies (NEBULIZER/TUBING/MOUTHPIECE) KIT Use as directed   rosuvastatin (CRESTOR) 10 MG tablet Take 1 tablet (10 mg total) by mouth daily.   terconazole (TERAZOL 3) 0.8 % vaginal cream Place 1 applicator vaginally at bedtime.   traZODone (DESYREL) 50 MG tablet Take 50-100 mg by mouth at bedtime.   Vitamin D, Ergocalciferol, (DRISDOL) 1.25 MG (50000 UNIT) CAPS capsule Take 50,000 Units by mouth once a week.   ziprasidone (GEODON) 60 MG capsule Take 60 mg by mouth daily.   Current Facility-Administered Medications for the 10/24/21 encounter (Office Visit) with Tushar Enns, Wonda Cheng, MD  Medication   clindamycin (CLEOCIN) 2 % vaginal cream 1 Applicatorful     Allergies:   Bactrim [sulfamethoxazole-trimethoprim], Ciprofloxacin, Clindamycin/lincomycin, Flagyl [metronidazole], Iodinated contrast media, Liraglutide, Doxycycline, Penicillins, and Prednisone  Social History   Socioeconomic History   Marital status: Single    Spouse name: Not on file   Number of children: Not on file   Years of education: Not on file   Highest education level: Not on file  Occupational History   Not on file  Tobacco Use   Smoking status: Every Day    Packs/day: 1.00    Years: 20.00    Total pack years: 20.00    Types: Cigarettes   Smokeless tobacco: Never   Tobacco comments:    Smoking a pack per day.   Vaping Use   Vaping Use: Not on file  Substance and Sexual Activity   Alcohol use: No   Drug use: No   Sexual activity: Not Currently    Partners: Male    Birth control/protection: Condom  Other Topics Concern   Not on file  Social History Narrative   Not on file   Social Determinants of Health   Financial Resource Strain: Not on file  Food Insecurity: Not on file  Transportation Needs: Not on file  Physical Activity: Not on file  Stress: Not on file  Social Connections: Not on file     Family History: The patient's family history includes Asthma in her mother; Diabetes in her maternal grandmother; Hypertension in her maternal grandmother; Valvular heart disease in her father.  ROS:   Please see the history of present illness.     All other systems reviewed and are negative.  EKGs/Labs/Other Studies Reviewed:    The following studies were reviewed today:    Recent Labs: 11/12/2020: Hemoglobin 13.8; Platelets 203 07/16/2021: ALT 87; BUN 4; Creatinine, Ser 0.63; Potassium 4.0; Sodium 128  Recent Lipid Panel    Component Value Date/Time   CHOL 185 07/16/2021 1412   TRIG 277 (H) 07/16/2021 1412   HDL 36 (L) 07/16/2021 1412   CHOLHDL 5.1 (H) 07/16/2021 1412   CHOLHDL 8.7 (H) 02/20/2015 0001   VLDL NOT CALC 02/20/2015 0001   LDLCALC 102 (H) 07/16/2021 1412    Physical Exam:    Physical Exam: Blood pressure 138/78, pulse 93, height $RemoveBe'5\' 3"'treJhsLxg$  (1.6 m), weight 197 lb 12.8 oz (89.7 kg), SpO2 93 %.  GEN:  young, obese female,  HEENT: Normal NECK: No JVD; No carotid bruits LYMPHATICS: No lymphadenopathy CARDIAC: RRR , no murmurs, rubs, gallops RESPIRATORY:  Clear to auscultation without rales, wheezing or rhonchi  ABDOMEN: Soft, non-tender, non-distended MUSCULOSKELETAL:  No edema; No deformity  SKIN: Warm and dry NEUROLOGIC:  Alert and oriented x 3   ECG: October 24, 2021: Normal sinus rhythm at 82.  Normal EKG.  ASSESSMENT:    1. Precordial pain      PLAN:        1.    HTN:  BP is fairly well controlled.     2.  Chest pressure: She has had chest pressure chronically for years.  We have done several normal Myoview studies.  We have scheduled her for coronary CT angiograms in the past but she failed a short.  She assures me that she will stop if we reschedule the test.  We will schedule her for a coronary CTA   2.  Hyperlipidemia:    - cont meds.     3.  Cigarette smoking:   Advised smoking cessation.  She does not seem to want to stop smoking   4.  Leg edema:   We have performed a venous duplex scan. I Nov, 2022  She  has chronic venous insufficiency of her left leg.  We discussed the fact that she should work on weight loss, salt reduction, exercise, smoking cessation, compression hose  5.  Diabetes:             Medication Adjustments/Labs and Tests Ordered: Current medicines are reviewed at length with the patient today.  Concerns regarding medicines are outlined above.  Orders Placed This Encounter  Procedures   CT CORONARY MORPH W/CTA COR W/SCORE W/CA W/CM &/OR WO/CM   Basic metabolic panel   EKG [id number]-[id number]    Meds ordered this encounter  Medications   predniSONE (DELTASONE) 50 MG tablet    Sig: Take 1 tablet 13 hours prior to test. Take 1 tablet 7 hours prior to test. Take 1 tablet 1 hour prior to test.    Dispense:  3 tablet    Refill:  0   diphenhydrAMINE (BENADRYL) 50 MG tablet    Sig: Take 1 tablet (50 mg total) by mouth once for 1 dose. Take one hour prior to test.    Dispense:  1 tablet    Refill:  0      Patient Instructions  Medication Instructions:  Your physician recommends that you continue on your current medications as directed. Please refer to the Current Medication list given to you today.  *If you need a refill on your cardiac medications before your next appointment, please call your pharmacy*   Lab Work: Bmet If you have labs (blood work) drawn today and your tests are completely normal, you will  receive your results only by: [place] (if you have MyChart) OR A paper copy in the mail If you have any lab test that is abnormal or we need to change your treatment, we will call you to review the results.   Testing/Procedures: Your physician has requested that you have cardiac CT. Cardiac computed tomography (CT) is a painless test that uses an x-ray machine to take clear, detailed pictures of your heart. For further information please visit [email or web address]. Please follow instruction sheet as given.     Follow-Up: At [facility], you and your health needs are our priority.  As part of our continuing mission to provide you with exceptional heart care, we have created designated Provider Care Teams.  These Care Teams include your primary Cardiologist (physician) and Advanced Practice Providers (APPs -  Physician Assistants and Nurse Practitioners) who all work together to provide you with the care you need, when you need it.   Your next appointment:   1 year(s)  The format for your next appointment:   In Person  Provider:   Mertie Moores, MD     Other Instructions   Your cardiac CT will be scheduled at one of the below locations:   [facility] [address] [place], [place] [address] [phone]   Please arrive at the [facility] and Children's Entrance (Entrance C2) of [facility] 30 minutes prior to test start time. You can use the FREE valet parking offered at entrance C (encouraged to control the heart rate for the test)  Proceed to the [facility] Radiology Department (first floor) to check-in and test prep.  All radiology patients and guests should use entrance C2 at [facility], accessed from [facility], even though the hospital's physical address listed is [address].     Please follow these instructions carefully (unless otherwise directed):   On the Night Before the Test: Be sure to  Drink  plenty of water. Do not consume any caffeinated/decaffeinated beverages or chocolate 12 hours prior to your test. Do not take any antihistamines 12 hours prior to your test. If the patient has contrast allergy: Patient will need a prescription for Prednisone and very clear instructions (as follows): Prednisone 50 mg - take 13 hours prior to test Take another Prednisone 50 mg 7 hours prior to test Take another Prednisone 50 mg 1 hour prior to test Take Benadryl 50 mg 1 hour prior to test Patient must complete all four doses of above prophylactic medications. Patient will need a ride after test due to Benadryl.  On the Day of the Test: Drink plenty of water until 1 hour prior to the test. Do not eat any food 4 hours prior to the test. You may take your regular medications prior to the test.  Take metoprolol (Lopressor) two hours prior to test. HOLD Furosemide/Hydrochlorothiazide morning of the test. FEMALES- please wear underwire-free bra if available, avoid dresses & tight clothing      After the Test: Drink plenty of water. After receiving IV contrast, you may experience a mild flushed feeling. This is normal. On occasion, you may experience a mild rash up to 24 hours after the test. This is not dangerous. If this occurs, you can take Benadryl 25 mg and increase your fluid intake. If you experience trouble breathing, this can be serious. If it is severe call 911 IMMEDIATELY. If it is mild, please call our office. If you take any of these medications: Glipizide/Metformin, Avandament, Glucavance, please do not take 48 hours after completing test unless otherwise instructed.  We will call to schedule your test 2-4 weeks out understanding that some insurance companies will need an authorization prior to the service being performed.   For non-scheduling related questions, please contact the cardiac imaging nurse navigator should you have any questions/concerns: Marchia Bond, Cardiac Imaging  Nurse Navigator Gordy Clement, Cardiac Imaging Nurse Navigator [facility] Direct Office Dial: [phone]   For scheduling needs, including cancellations and rescheduling, please call [place], [phone].   Important Information About Sugar         Signed, Mertie Moores, MD  10/24/2021 4:41 PM    [facility]

## 2021-10-25 ENCOUNTER — Encounter: Payer: Self-pay | Admitting: Cardiovascular Disease

## 2021-10-25 LAB — BASIC METABOLIC PANEL
BUN/Creatinine Ratio: 6 — ABNORMAL LOW (ref 9–23)
BUN: 3 mg/dL — ABNORMAL LOW (ref 6–20)
CO2: 20 mmol/L (ref 20–29)
Calcium: 8.6 mg/dL — ABNORMAL LOW (ref 8.7–10.2)
Chloride: 99 mmol/L (ref 96–106)
Creatinine, Ser: 0.53 mg/dL — ABNORMAL LOW (ref 0.57–1.00)
Glucose: 203 mg/dL — ABNORMAL HIGH (ref 70–99)
Potassium: 3.9 mmol/L (ref 3.5–5.2)
Sodium: 137 mmol/L (ref 134–144)
eGFR: 121 mL/min/{1.73_m2} (ref >59)

## 2021-11-13 ENCOUNTER — Other Ambulatory Visit (HOSPITAL_COMMUNITY): Payer: Self-pay | Admitting: *Deleted

## 2021-11-13 ENCOUNTER — Telehealth (HOSPITAL_COMMUNITY): Payer: Self-pay | Admitting: *Deleted

## 2021-11-13 ENCOUNTER — Encounter (HOSPITAL_COMMUNITY): Payer: Self-pay

## 2021-11-13 MED ORDER — METOPROLOL TARTRATE 100 MG PO TABS: ORAL_TABLET | ORAL | 0 refills | Status: DC

## 2021-11-13 MED ORDER — IVABRADINE HCL 5 MG PO TABS: ORAL_TABLET | ORAL | 0 refills | Status: DC

## 2021-11-13 NOTE — Telephone Encounter (Signed)
Reaching out to patient to offer assistance regarding upcoming cardiac imaging study; pt verbalizes understanding of appt date/time, parking situation and where to check in, pre-test NPO status and medications ordered, and verified current allergies; name and call back number provided for further questions should they arise  Emily Brick RN Navigator Cardiac Imaging [facility] [phone] office [phone] cell  Patient states she takes propanolol and HR is still high. Per protocol, patient will take 100mg  metoprolol tartrate and 15mg  ivabradine two hours prior to her cardiac CT scan along with 13 hour prep. She will hold her afternoon dose of propanolol and her daily losartan. Reviewed instructions with patient and she verbalized understanding. Mychart instructions sent to patient as well.

## 2021-11-15 ENCOUNTER — Ambulatory Visit (HOSPITAL_COMMUNITY): Admission: RE | Admit: 2021-11-15 | Payer: Medicaid Other | Source: Ambulatory Visit

## 2021-12-02 ENCOUNTER — Encounter: Payer: Self-pay | Admitting: Cardiovascular Disease

## 2021-12-09 ENCOUNTER — Encounter: Payer: Self-pay | Admitting: Cardiovascular Disease

## 2021-12-09 MED ORDER — AMLODIPINE BESYLATE 5 MG PO TABS: 5.0000 mg | ORAL_TABLET | Freq: Every day | ORAL | 3 refills | Status: DC

## 2021-12-12 ENCOUNTER — Ambulatory Visit: Payer: Medicaid Other | Admitting: Pulmonary Disease

## 2021-12-12 ENCOUNTER — Encounter: Payer: Self-pay | Admitting: Pulmonary Disease

## 2021-12-12 VITALS — BP 128/62 | HR 67 | Temp 98.4°F | Ht 63.0 in | Wt 193.0 lb

## 2021-12-12 DIAGNOSIS — J209 Acute bronchitis, unspecified: Secondary | ICD-10-CM | POA: Diagnosis not present

## 2021-12-12 DIAGNOSIS — J45909 Unspecified asthma, uncomplicated: Secondary | ICD-10-CM | POA: Diagnosis not present

## 2021-12-12 MED ORDER — AZITHROMYCIN 250 MG PO TABS: ORAL_TABLET | ORAL | 0 refills | Status: DC

## 2021-12-12 NOTE — Progress Notes (Signed)
Emily [name]    [id number]    [birth year]  Primary Care Physician:Hess, Emily Huxley, PA-C  Referring Physician: Harlene Salts, PA-C [place],  [place] [id number]  Chief complaint: Acute visit for dyspnea  HPI: 41 y.o. who  has a past medical history of Abnormal CT of liver, Anxiety, Asthma, Asthma, Bipolar 1 disorder ([place]), Bipolar disorder ([place]), Chronic pain, Depression, Fatty liver, GERD (gastroesophageal reflux disease), Headache(784.0), Hyperlipidemia, Hypertension, Pulmonary nodule, Tachycardia, and Thyroid disease.   She follows with Dr. Chase [name] for chronic bronchitis and continues to smoke 1 pack/day Complains of increasing cough with greenish mucus, dyspnea.  No wheeze.  Denies any fevers, chills  Outpatient Encounter Medications as of 12/12/2021  Medication Sig   ACCU-CHEK AVIVA PLUS test strip 1 each by Other route 3 (three) times daily.   ACCU-CHEK FASTCLIX LANCETS MISC USE AS DIRECTED   albuterol (VENTOLIN HFA) 108 (90 Base) MCG/ACT inhaler Inhale 2 puffs into the lungs every 6 (six) hours as needed for wheezing or shortness of breath.   amitriptyline (ELAVIL) 25 MG tablet Take 50 mg by mouth at bedtime.   amLODipine (NORVASC) 5 MG tablet Take 1 tablet (5 mg total) by mouth daily.   atorvastatin (LIPITOR) 40 MG tablet Take 40 mg by mouth daily.   benztropine (COGENTIN) 1 MG tablet Take 1 mg by mouth 2 (two) times daily.   Blood Glucose Monitoring Suppl (ACCU-CHEK AVIVA PLUS) w/Device KIT USE AS DIRECTED   clonazePAM (KLONOPIN) 0.5 MG disintegrating tablet Take 0.5 mg by mouth daily as needed.   fenofibrate (TRICOR) 145 MG tablet Take 1 tablet (145 mg total) by mouth daily.   gabapentin (NEURONTIN) 300 MG capsule Take 300-600 mg by mouth at bedtime as needed.   HUMALOG KWIKPEN 100 UNIT/ML KwikPen SUB-Q 3 Times Daily   insulin glargine (LANTUS SOLOSTAR) 100 UNIT/ML Solostar Pen Inject 26 Units into the skin at bedtime. Can increase to 40 units/day    Insulin Lispro Prot & Lispro (HUMALOG 75/25 MIX) (75-25) 100 UNIT/ML Kwikpen Inject 24 Units into the skin 2 (two) times daily.   Insulin Pen Needle 32G X 4 MM MISC 1 each by Does not apply route as needed.   ipratropium-albuterol (DUONEB) 0.5-2.5 (3) MG/3ML SOLN Take 3 mLs by nebulization in the morning, at noon, and at bedtime.   ivabradine (CORLANOR) 5 MG TABS tablet Take tablets ($RemoveBefor'15mg'JqweSGVZuLdT$ ) TWO hours prior to your cardiac CT scan.   lamoTRIgine (LAMICTAL) 150 MG tablet Take 150 mg by mouth daily.   levothyroxine (SYNTHROID, LEVOTHROID) 75 MCG tablet Take 1 tablet (75 mcg total) by mouth daily before breakfast.   losartan (COZAAR) 50 MG tablet Take 1 tablet (50 mg total) by mouth daily.   metoprolol tartrate (LOPRESSOR) 100 MG tablet Take tablet TWO hour prior to your cardiac CT scan.   montelukast (SINGULAIR) 10 MG tablet TAKE 1 TABLET BY MOUTH EVERYDAY AT BEDTIME   Nebulizers (COMPRESSOR/NEBULIZER) MISC Use as directed   NOVOLOG FLEXPEN 100 UNIT/ML FlexPen SMARTSIG:12 Unit(s) SUB-Q   omeprazole (PRILOSEC) 40 MG capsule Take 40 mg by mouth every morning.   paroxetine mesylate (PEXEVA) 20 MG tablet Take 20 mg by mouth daily.   PAXIL 20 MG tablet Take 20 mg by mouth daily.   predniSONE (DELTASONE) 50 MG tablet Take 1 tablet 13 hours prior to test. Take 1 tablet 7 hours prior to test. Take 1 tablet 1 hour prior to test.   promethazine (PHENERGAN) 12.5 MG  tablet Take 12.5 mg by mouth every 8 (eight) hours as needed.   propranolol (INDERAL) 20 MG tablet Take 1 tablet (20 mg total) by mouth 3 (three) times daily.   Respiratory Therapy Supplies (NEBULIZER/TUBING/MOUTHPIECE) KIT Use as directed   rosuvastatin (CRESTOR) 10 MG tablet Take 1 tablet (10 mg total) by mouth daily.   terconazole (TERAZOL 3) 0.8 % vaginal cream Place 1 applicator vaginally at bedtime.   traZODone (DESYREL) 50 MG tablet Take 50-100 mg by mouth at bedtime.   Vitamin D, Ergocalciferol, (DRISDOL) 1.25 MG (50000 UNIT) CAPS capsule  Take 50,000 Units by mouth once a week.   ziprasidone (GEODON) 60 MG capsule Take 60 mg by mouth daily.   diphenhydrAMINE (BENADRYL) 50 MG tablet Take 1 tablet (50 mg total) by mouth once for 1 dose. Take one hour prior to test.   nicotine (NICODERM CQ - DOSED IN MG/24 HOURS) 14 mg/24hr patch 2nd month after completing 21 mg/24 hr patches (Patient not taking: Reported on 12/12/2021)   nicotine (NICODERM CQ - DOSED IN MG/24 HOURS) 21 mg/24hr patch Start first (Patient not taking: Reported on 12/12/2021)   nicotine (NICODERM CQ - DOSED IN MG/24 HR) 7 mg/24hr patch Use for the 3rd month (Patient not taking: Reported on 12/12/2021)   nitroGLYCERIN (NITROSTAT) 0.4 MG SL tablet Place 1 tablet (0.4 mg total) under the tongue every 5 (five) minutes as needed for chest pain. (Patient not taking: Reported on 12/12/2021)   Facility-Administered Encounter Medications as of 12/12/2021  Medication   clindamycin (CLEOCIN) 2 % vaginal cream 1 Applicatorful   Physical Exam: Blood pressure 128/62, pulse 67, temperature 98.4 F (36.9 C), temperature source Oral, height $RemoveBefo'5\' 3"'UzzLwbCHZRo$  (1.6 m), weight 193 lb (87.5 kg), SpO2 97 %. Gen:      No acute distress HEENT:  EOMI, sclera anicteric Neck:     No masses; no thyromegaly Lungs:    Clear to auscultation bilaterally; normal respiratory effort CV:         Regular rate and rhythm; no murmurs Abd:      + bowel sounds; soft, non-tender; no palpable masses, no distension Ext:    No edema; adequate peripheral perfusion Skin:      Warm and dry; no rash Neuro: alert and oriented x 3 Psych: normal mood and affect   Data Reviewed: Imaging: CT high-resolution 07/25/20-bandlike scarring in the dependent left lung.  Mild air trapping, fine centrilobular nodularity, coronary artery disease, hepatic steatosis.  Chest x-ray 05/27/2021-stable exam, no acute cardiopulmonary disease. I have reviewed the images personally.  Assessment:  Acute on chronic bronchitis Continue inhalers.   Prescribe Z-Pak Not wheezing on examination today and does not need steroids Advised to quit smoking.  Follow-up with Dr. Chase [name].  Emily Garfinkel MD [facility] 12/12/2021, 1:59 PM  CC: Emily Salts, PA-C

## 2021-12-12 NOTE — Patient Instructions (Signed)
We will prescribe a Z-Pak for treatment of bronchitis Follow-up with Dr. Marchelle Gearing

## 2021-12-17 ENCOUNTER — Ambulatory Visit: Payer: Medicaid Other | Admitting: Nurse Practitioner

## 2021-12-18 MED ORDER — PREDNISONE 10 MG PO TABS: 40.0000 mg | ORAL_TABLET | Freq: Every day | ORAL | 0 refills | Status: AC

## 2021-12-18 NOTE — Telephone Encounter (Signed)
Primary Pulmonologist: Patient has seen Rubye Oaks NP and Dr. Isaiah Serge in the last year  Last office visit and with whom: 12/12/2021 Dr. Isaiah Serge  What do we see them for (pulmonary problems): Asthma/ Acute Bronchitis  Last OV assessment/plan: see below  Was appointment offered to patient (explain)?  no   Reason for call: Patient sent mychart message stating she was given a zpak for bronchitis during appt with Dr. Isaiah Serge on 12/12/21 but is still coughing up green and clear sputum and is wheezing. Denies fever. Has done an extra breathing treatment and would like to know if there is anything else she can try/ we can send in for her. Please advise.    Allergies  Allergen Reactions   Bactrim [Sulfamethoxazole-Trimethoprim] Anaphylaxis and Other (See Comments)    Chest pain, trouble breathing   Ciprofloxacin Palpitations   Clindamycin/Lincomycin     Other reaction(s): Chest Pain   Flagyl [Metronidazole] Shortness Of Breath   Iodinated Contrast Media Swelling   Liraglutide Nausea And Vomiting   Doxycycline Nausea And Vomiting   Penicillins    Prednisone Other (See Comments)    Makes her angry and irritable    Immunization History  Administered Date(s) Administered   Influenza Split 03/22/2014   Influenza Whole 01/13/2008   Influenza, Seasonal, Injecte, Preservative Fre 04/18/2015   Influenza,inj,Quad PF,6+ Mos 04/18/2015   PFIZER(Purple Top)SARS-COV-2 Vaccination 03/12/2020, 04/16/2020   Pneumococcal Polysaccharide-23 05/10/2012   Tdap 01/02/2006, 08/08/2015    Facility-Administered Encounter Medications as of 12/12/2021  Medication   clindamycin (CLEOCIN) 2 % vaginal cream 1 Applicatorful    Physical Exam: Blood pressure 128/62, pulse 67, temperature 98.4 F (36.9 C), temperature source Oral, height 5\' 3"  (1.6 m), weight 193 lb (87.5 kg), SpO2 97 %. Gen:       No acute distress HEENT:  EOMI, sclera anicteric Neck:     No masses; no thyromegaly Lungs:    Clear to auscultation  bilaterally; normal respiratory effort CV:         Regular rate and rhythm; no murmurs Abd:        + bowel sounds; soft, non-tender; no palpable masses, no distension Ext:         No edema; adequate peripheral perfusion Skin:      Warm and dry; no rash Neuro: alert and oriented x 3 Psych: normal mood and affect    Data Reviewed: Imaging: CT high-resolution 07/25/20-bandlike scarring in the dependent left lung.  Mild air trapping, fine centrilobular nodularity, coronary artery disease, hepatic steatosis.   Chest x-ray 05/27/2021-stable exam, no acute cardiopulmonary disease. I have reviewed the images personally.   Assessment:  Acute on chronic bronchitis Continue inhalers.  Prescribe Z-Pak Not wheezing on examination today and does not need steroids Advised to quit smoking.  Follow-up with Dr. 07/25/2021.   Marchelle Gearing MD [facility] 12/12/2021, 1:59 PM   CC: 12/14/2021, PA-C

## 2021-12-18 NOTE — Telephone Encounter (Signed)
Please call in prednisone 40 mg a day for 5 days.  I do not believe she will require additional antibiotics.

## 2021-12-18 NOTE — Telephone Encounter (Signed)
I was paged about patient concerns. Patient reports arm tingling and facial pressure particularly her right cheek started about an hour ago. No chest pain, no difficulty breathing, no presyncope or syncope.  Patient feeling anxious and feels her heart is racing.  No dyspnea on exertion.  Recent stressful event including emotional argument earlier today. Symptoms started  around 7:30 PM.  Patient had similar symptoms before. Patient denies any facial asymmetry. Patient denies any word finding difficulties. Patient is able to converse in full sentences.  Alert and oriented x4. In short I do not think there is any acute cardiac problem.  Patient numbness and tingling is concerning particularly in light of multiple risk factors and differential diagnosis include anxiety, peripheral neuralgia versus stroke. Patient was advised to seek care in the nearest emergency room

## 2021-12-25 ENCOUNTER — Telehealth: Payer: Self-pay | Admitting: Student in an Organized Health Care Education/Training Program

## 2021-12-25 NOTE — Telephone Encounter (Signed)
Received a call from the patient regarding multiple readings of low blood pressures of 80s/50s for the last several hours. She states that earlier in the day she passed out and is currently feeling light headed. I have asked her to report to the ED via EMS to be evaluated.    Emily Kea, MD MS  Cardiology

## 2022-01-08 ENCOUNTER — Telehealth (HOSPITAL_COMMUNITY): Payer: Self-pay | Admitting: *Deleted

## 2022-01-08 NOTE — Telephone Encounter (Signed)
Attempted to call patient regarding upcoming cardiac CT appointment. °Left message on voicemail with name and callback number ° °Tanmay Halteman RN Navigator Cardiac Imaging °[facility] °[phone] Office °[phone] Cell ° °

## 2022-01-09 ENCOUNTER — Ambulatory Visit (HOSPITAL_COMMUNITY): Admission: RE | Admit: 2022-01-09 | Payer: Medicaid Other | Source: Ambulatory Visit

## 2022-01-17 ENCOUNTER — Encounter: Payer: Self-pay | Admitting: Cardiovascular Disease

## 2022-01-17 ENCOUNTER — Telehealth: Payer: Self-pay | Admitting: Cardiovascular Disease

## 2022-01-17 DIAGNOSIS — J45909 Unspecified asthma, uncomplicated: Secondary | ICD-10-CM

## 2022-01-17 NOTE — Telephone Encounter (Signed)
Pt called the office stating that she started having chest discomfort overnight 9/28. States that she is coughing getting up clear phlegm. Pt does have some wheezing.  States when she smokes, the discomfort is worse so I did recommend to pt that she should not smoke.  Pt stated that she tried taking tramadol to see if it would help with the discomfort and she stated that that did not help. Pt stated she does 3 breathing treatments a day.  States that her pain is uncomfortable. Stated to pt that she should go to UC to be evaluated as we had no openings today 9/29 and she verbalized understanding. Nothing further needed.

## 2022-01-17 NOTE — Telephone Encounter (Signed)
Pt c/o of Chest Pain: STAT if CP now or developed within 24 hours  1. Are you having CP right now? Yes having chest pain right now  2. Are you experiencing any other symptoms (ex. SOB, nausea, vomiting, sweating)?  Feet feel sweaty-  3. How long have you been experiencing CP? Started last night  4. Is your CP continuous or coming and going?  constantly  5. Have you taken Nitroglycerin?  Do not have any ?

## 2022-01-17 NOTE — Telephone Encounter (Signed)
Pt called in to report CP that started last night.  Describes as chest pressure on both sides of chest.   Pt ordered to have Cardiac CT pt never completed expresses was to sick.  Denies arm/neck/shoulder pain.  Does report feet sweating.  Has recently been sick with stomach issues.  Reports has been burping takes Protonix PO daily. Continues to smoke.  Reports potassium level was low the last time she went to the hospital.  BP while on phone 130/85-74.  Advised pt to go to the ED for evaluation.  Pt would like to come in office.  Advised pt our office is not the best place to be for active CP.  The ED is better suited to care for her needs.  Pt is hesitant but at end of call agrees to ED evaluation.

## 2022-01-17 NOTE — Telephone Encounter (Signed)
Please see telephone encounter from today for more details.

## 2022-01-21 ENCOUNTER — Encounter (HOSPITAL_COMMUNITY): Payer: Self-pay

## 2022-01-27 ENCOUNTER — Other Ambulatory Visit: Payer: Self-pay | Admitting: Internal Medicine

## 2022-01-27 MED ORDER — AZITHROMYCIN 250 MG PO TABS: ORAL_TABLET | ORAL | 0 refills | Status: DC

## 2022-01-27 NOTE — Telephone Encounter (Signed)
Brand Males, MD 10 minutes ago (11:28 AM)    Check covid antigen test at home x 2 -if positive call for antiviral Send Z pak at her request

## 2022-01-27 NOTE — Addendum Note (Signed)
Addended by: Lorretta Harp on: 01/27/2022 11:41 AM   Modules accepted: Orders

## 2022-01-27 NOTE — Telephone Encounter (Signed)
Mychart message sent by pt: Emily [name]  [facility] (supporting [name] S Parrett, NP) 18 minutes ago (10:09 AM)    Hey I was wondering if you could send me in a zpack I called I couldn't get nobody I have some snot  and a cough little green coming out of my nose thanks    MR, please advise.

## 2022-01-27 NOTE — Telephone Encounter (Signed)
Check covid antigen test at home x 2 -if positive call for antiviral Send Z pak at her request   Allergies  Allergen Reactions   Bactrim [Sulfamethoxazole-Trimethoprim] Anaphylaxis and Other (See Comments)    Chest pain, trouble breathing   Ciprofloxacin Palpitations   Clindamycin/Lincomycin     Other reaction(s): Chest Pain   Flagyl [Metronidazole] Shortness Of Breath   Iodinated Contrast Media Swelling   Liraglutide Nausea And Vomiting   Doxycycline Nausea And Vomiting   Penicillins    Prednisone Other (See Comments)    Makes her angry and irritable

## 2022-01-29 LAB — HM DIABETES EYE EXAM

## 2022-02-14 ENCOUNTER — Other Ambulatory Visit: Payer: Self-pay | Admitting: Adult Health

## 2022-02-14 DIAGNOSIS — J45909 Unspecified asthma, uncomplicated: Secondary | ICD-10-CM

## 2022-02-14 MED ORDER — IPRATROPIUM-ALBUTEROL 0.5-2.5 (3) MG/3ML IN SOLN: 3.0000 mL | Freq: Three times a day (TID) | RESPIRATORY_TRACT | 11 refills | Status: DC

## 2022-02-14 NOTE — Addendum Note (Signed)
Addended by: Lorretta Harp on: 02/14/2022 03:01 PM   Modules accepted: Orders

## 2022-02-17 ENCOUNTER — Encounter (HOSPITAL_COMMUNITY): Payer: Self-pay

## 2022-03-10 ENCOUNTER — Other Ambulatory Visit (HOSPITAL_COMMUNITY): Payer: Self-pay | Admitting: Cardiovascular Disease

## 2022-03-10 ENCOUNTER — Telehealth: Payer: Self-pay | Admitting: Cardiovascular Disease

## 2022-03-10 ENCOUNTER — Encounter: Payer: Self-pay | Admitting: Cardiovascular Disease

## 2022-03-10 DIAGNOSIS — R072 Precordial pain: Secondary | ICD-10-CM

## 2022-03-10 DIAGNOSIS — R079 Chest pain, unspecified: Secondary | ICD-10-CM

## 2022-03-10 MED ORDER — NITROGLYCERIN 0.4 MG SL SUBL: 0.4000 mg | SUBLINGUAL_TABLET | SUBLINGUAL | 3 refills | Status: AC | PRN

## 2022-03-10 MED ORDER — AZITHROMYCIN 250 MG PO TABS: 250.0000 mg | ORAL_TABLET | ORAL | 0 refills | Status: DC

## 2022-03-10 NOTE — Telephone Encounter (Signed)
May send in Z-Pak No. 1 take as directed.  Mucinex DM twice daily as needed for cough and congestion.  Saline nasal rinses.   Rest and fluids.  Please make sure she has a follow-up visit with Dr. Isaiah Serge  If symptoms or not improving or worsen she will need a sooner follow-up. Please contact office for sooner follow up if symptoms do not improve or worsen or seek emergency care

## 2022-03-10 NOTE — Telephone Encounter (Signed)
[name], please advise on pt's most recent messages. She is c/o green sinus mucus with sinus headache, without fever and other respiratory symptoms. Pt states she is neg for covid. Dr. Isaiah Serge is unavailable. Thanks.

## 2022-03-10 NOTE — Telephone Encounter (Signed)
Pt c/o of Chest Pain: STAT if CP now or developed within 24 hours  1. Are you having CP right now? Yes   2. Are you experiencing any other symptoms (ex. SOB, nausea, vomiting, sweating)? no  3. How long have you been experiencing CP? Yesterday   4. Is your CP continuous or coming and going? Coming and going   5. Have you taken Nitroglycerin? no ?

## 2022-03-10 NOTE — Telephone Encounter (Signed)
Call transferred in for chest pain. The patient said she was hoping to come in today.  She has been having chest pain since yesterday.  Intermittent.  Goes away on its own.  Also the aspirin helped yesterday. Inhaling her cigarette makes it worse but not taking deep breath in or pressing on the area.  Adv the patient that coming to the office with active chest pain is not advised and that the ER would be best suited to help her. Adv to call EMS or be driven to nearest ER.   A cardiac cta was ordered in July but she has been unable to complete this due to other medical issues that have risen and also the prednisone is so hard on her "tears me up" .  She has a contrast allergy which causes SOB and swelling.    She is will to try to schedule CT.  Still has instructions.  Has not picked the [facility] but will [facility] to make sure they still have the script for this.  She does not have ntg so I will refill this.  Message to Dr. Elease Hashimoto and CT scheduler.

## 2022-03-10 NOTE — Telephone Encounter (Signed)
Spoke with the pt and notified of response per TP  She verbalized understanding  Rx was sent to pharm  She has ov tomorrow for eval with TP

## 2022-03-11 ENCOUNTER — Ambulatory Visit: Payer: Medicaid Other | Admitting: Adult Health

## 2022-03-11 ENCOUNTER — Telehealth: Payer: Self-pay | Admitting: *Deleted

## 2022-03-11 ENCOUNTER — Ambulatory Visit (INDEPENDENT_AMBULATORY_CARE_PROVIDER_SITE_OTHER): Payer: Medicaid Other

## 2022-03-11 ENCOUNTER — Ambulatory Visit: Payer: Medicaid Other

## 2022-03-11 ENCOUNTER — Telehealth (HOSPITAL_COMMUNITY): Payer: Self-pay | Admitting: Emergency Medicine

## 2022-03-11 ENCOUNTER — Telehealth: Payer: Self-pay | Admitting: Adult Health

## 2022-03-11 ENCOUNTER — Encounter: Payer: Self-pay | Admitting: Adult Health

## 2022-03-11 ENCOUNTER — Other Ambulatory Visit: Payer: Self-pay | Admitting: *Deleted

## 2022-03-11 VITALS — BP 106/80 | HR 89 | Temp 98.1°F | Ht 63.0 in | Wt 190.0 lb

## 2022-03-11 DIAGNOSIS — J209 Acute bronchitis, unspecified: Secondary | ICD-10-CM | POA: Diagnosis not present

## 2022-03-11 DIAGNOSIS — K029 Dental caries, unspecified: Secondary | ICD-10-CM

## 2022-03-11 DIAGNOSIS — R051 Acute cough: Secondary | ICD-10-CM

## 2022-03-11 DIAGNOSIS — F17200 Nicotine dependence, unspecified, uncomplicated: Secondary | ICD-10-CM | POA: Diagnosis not present

## 2022-03-11 DIAGNOSIS — R0789 Other chest pain: Secondary | ICD-10-CM

## 2022-03-11 MED ORDER — BREZTRI AEROSPHERE 160-9-4.8 MCG/ACT IN AERO: 2.0000 | INHALATION_SPRAY | Freq: Two times a day (BID) | RESPIRATORY_TRACT | 6 refills | Status: DC

## 2022-03-11 MED ORDER — BREZTRI AEROSPHERE 160-9-4.8 MCG/ACT IN AERO: 2.0000 | INHALATION_SPRAY | Freq: Two times a day (BID) | RESPIRATORY_TRACT | 5 refills | Status: DC

## 2022-03-11 MED ORDER — BENZONATATE 200 MG PO CAPS: 200.0000 mg | ORAL_CAPSULE | Freq: Three times a day (TID) | ORAL | 1 refills | Status: AC | PRN

## 2022-03-11 NOTE — Telephone Encounter (Signed)
Patient is returning a call.  Stated she missed the call and would like a call back at [phone]

## 2022-03-11 NOTE — Assessment & Plan Note (Signed)
Acute asthmatic bronchitic exacerbation slow to resolve.  Chest x-ray without acute process. Will have patient finish up Z-Pak.  Begin Breztri inhaler twice daily.  May use nebulizer as needed  Plan  Patient Instructions  Finish Zpack as directed Begin Breztri 2 puffs Twice daily, rinse after use .  Use Duoneb as needed  Activity as tolerated.  Mucinex DM Twice daily  As needed  Cough/cogestion .  Tessalon Three times a day  As needed  cough .  Work on not smoking .  If chest pain returns will need to go to ER for evaluation  Follow up with cardiology tomorrow as planned for coronary CT  Follow up for dentist in January as planned  Follow up with Dr. Marchelle Gearing with PFT in 4-6 weeks and As needed   Please contact office for sooner follow up if symptoms do not improve or worsen or seek emergency care

## 2022-03-11 NOTE — Assessment & Plan Note (Signed)
Smoking cessation discussed in detail 

## 2022-03-11 NOTE — Assessment & Plan Note (Signed)
Recurrent atypical chest pain and tightness.  Questionable etiology.  Suspect is related to underlying asthmatic bronchitic flare. Chest x-ray without acute process.  EKG with nonspecific changes.  No acute process noted.  Patient is advised if she continues to have ongoing chest pain advised her to have a full work-up in the emergency room.  Continue follow-up with cardiology.  She does have a coronary CT chest for chest pain tomorrow.  Plan  Patient Instructions  Finish Zpack as directed Begin Breztri 2 puffs Twice daily, rinse after use .  Use Duoneb as needed  Activity as tolerated.  Mucinex DM Twice daily  As needed  Cough/cogestion .  Tessalon Three times a day  As needed  cough .  Work on not smoking .  If chest pain returns will need to go to ER for evaluation  Follow up with cardiology tomorrow as planned for coronary CT  Follow up for dentist in January as planned  Follow up with Dr. Marchelle Gearing with PFT in 4-6 weeks and As needed   Please contact office for sooner follow up if symptoms do not improve or worsen or seek emergency care

## 2022-03-11 NOTE — Telephone Encounter (Signed)
Called and spoke to patient and advised her that she will need to come in before her appointment for her chest xray. She verbalized understanding. Nothing further needed

## 2022-03-11 NOTE — Telephone Encounter (Signed)
Trellis Paganini, [place], RN19 hours ago (2:54 PM)    Hey, I been trying to reach her to get her in since July. I even sent a letter out no response due to that her referral was closed. She will need a new order in and I will look to see if anyone cx and put her in the slot.    MD renewed coronary CTA order for patient. Pre-med for contrast allergy was sent to pharmacy on file at time of original order. Please remind patient about pre-meds when scheduling.

## 2022-03-11 NOTE — Progress Notes (Signed)
_0  ID: Emily [name], female    DOB: 02-16-1981, 41 y.o.   MRN: [id number]  Chief Complaint  Patient presents with   Follow-up    Referring provider: Cannon Kettle  HPI: 41 year old female active smoker followed for chronic bronchitis and chronic rhinitis Medical history significant for diabetes, bipolar disorder and panic disorder  TEST/EVENTS :  Ace - 81 Esr - 30 Rh<14 CCP<16 ANCA - negative  Hypersensitive activity pneumonitis panel negative Anti-Jo 1 less than 0.2 RNP antibodies greater than 8 Sjogren's syndrome antibodies negative Anti-scleroderma antibody less than 1 Aldolase 4.5 MPO/PR-3 (ANCA) antibodies-less than 1 Total CK 47, CK-MB less than 7 -negative     04/01/2018-CT chest high resolution- few patchy areas of mild groundglass attenuation in the lungs bilaterally nonspecific, stable pulmonary nodules from 2009    01/08/2015- spirometry- FVC 1.33 (38% predicted), ratio 72, FEV1 1.01 (37% predicted)   High-resolution CT chest July 25, 2020 bandlike scarring left base, no evidence of interstitial lung disease, mild air trapping, fine centrilobular nodularity concentrated in the lung apices consistent with smoking-related bronchiolitis  03/11/2022 Acute OV : Cough  Patient presents for an acute office visit.  She complains of 1 week of nasal congestion sinus drainage and productive cough with green mucus.  She was called in a Z-Pak yesterday she is on day 2 of 5.  Patient says she gets getting some intermittent mid chest wall tightness and pain with coughing.  She does have a coronary CT chest planned for tomorrow through cardiology for recurrent chest pain. Patient denies any hemoptysis, orthopnea, edema.  No fever.  Chest x-ray today shows no acute process.  Shows bilateral chronic interstitial thickening with no significant change.  Previous high-resolution CT chest as above shows no evidence of interstitial lung disease.  Some fine nodularity in  the lung apices consistent with smoking-related bronchiolitis.  We have discussed a maintenance inhaler for chronic asthmatic bronchitis however patient uses DuoNeb nebulizer 3 times daily and does not want to use inhaler.  EKG today shows nonspecific ST changes and poor R wave progression. No current chest pain .       Allergies  Allergen Reactions   Bactrim [Sulfamethoxazole-Trimethoprim] Anaphylaxis and Other (See Comments)    Chest pain, trouble breathing   Ciprofloxacin Palpitations   Clindamycin/Lincomycin     Other reaction(s): Chest Pain   Flagyl [Metronidazole] Shortness Of Breath   Iodinated Contrast Media Swelling   Liraglutide Nausea And Vomiting   Doxycycline Nausea And Vomiting   Penicillins    Prednisone Other (See Comments)    Makes her angry and irritable    Immunization History  Administered Date(s) Administered   Influenza Split 03/22/2014   Influenza Whole 01/13/2008   Influenza, Seasonal, Injecte, Preservative Fre 04/18/2015   Influenza,inj,Quad PF,6+ Mos 04/18/2015, 01/08/2022   PFIZER(Purple Top)SARS-COV-2 Vaccination 03/12/2020, 04/16/2020   Pneumococcal Polysaccharide-23 05/10/2012   Tdap 01/02/2006, 08/08/2015    Past Medical History:  Diagnosis Date   Abnormal CT of liver    Anxiety    Asthma    Asthma    Bipolar 1 disorder (HCC)    Bipolar disorder (HCC)    Chronic pain    Depression    Fatty liver    GERD (gastroesophageal reflux disease)    Headache(784.0)    Hyperlipidemia    Hypertension    Pulmonary nodule    Tachycardia    Thyroid disease     Tobacco History: Social History   Tobacco Use  Smoking  Status Every Day   Packs/day: 1.50   Years: 20.00   Total pack years: 30.00   Types: Cigarettes  Smokeless Tobacco Never  Tobacco Comments   Smoking a pack per day.  03/11/2022 hfb   Ready to quit: Not Answered Counseling given: Not Answered Tobacco comments: Smoking a pack per day.  03/11/2022 hfb   Outpatient  Medications Prior to Visit  Medication Sig Dispense Refill   ACCU-CHEK AVIVA PLUS test strip 1 each by Other route 3 (three) times daily.     ACCU-CHEK FASTCLIX LANCETS MISC USE AS DIRECTED 102 each 3   albuterol (VENTOLIN HFA) 108 (90 Base) MCG/ACT inhaler Inhale 2 puffs into the lungs every 6 (six) hours as needed for wheezing or shortness of breath. 18 g 1   amitriptyline (ELAVIL) 25 MG tablet Take 50 mg by mouth at bedtime.     amLODipine (NORVASC) 5 MG tablet Take 1 tablet (5 mg total) by mouth daily. 90 tablet 3   atorvastatin (LIPITOR) 40 MG tablet Take 40 mg by mouth daily.     azithromycin (ZITHROMAX) 250 MG tablet Take two today and then one daily until finished. 6 tablet 0   azithromycin (ZITHROMAX) 250 MG tablet Take 1 tablet (250 mg total) by mouth as directed. 6 tablet 0   benztropine (COGENTIN) 1 MG tablet Take 1 mg by mouth 2 (two) times daily.     Blood Glucose Monitoring Suppl (ACCU-CHEK AVIVA PLUS) w/Device KIT USE AS DIRECTED 1 kit 0   clonazePAM (KLONOPIN) 0.5 MG disintegrating tablet Take 0.5 mg by mouth daily as needed.     fenofibrate (TRICOR) 145 MG tablet Take 1 tablet (145 mg total) by mouth daily. 90 tablet 3   gabapentin (NEURONTIN) 300 MG capsule Take 300-600 mg by mouth at bedtime as needed.     HUMALOG KWIKPEN 100 UNIT/ML KwikPen SUB-Q 3 Times Daily     insulin glargine (LANTUS SOLOSTAR) 100 UNIT/ML Solostar Pen Inject 26 Units into the skin at bedtime. Can increase to 40 units/day     Insulin Lispro Prot & Lispro (HUMALOG 75/25 MIX) (75-25) 100 UNIT/ML Kwikpen Inject 24 Units into the skin 2 (two) times daily.     Insulin Pen Needle 32G X 4 MM MISC 1 each by Does not apply route as needed.     ipratropium-albuterol (DUONEB) 0.5-2.5 (3) MG/3ML SOLN Take 3 mLs by nebulization in the morning, at noon, and at bedtime. 270 mL 11   ivabradine (CORLANOR) 5 MG TABS tablet Take tablets (72m) TWO hours prior to your cardiac CT scan. 3 tablet 0   lamoTRIgine (LAMICTAL) 150  MG tablet Take 150 mg by mouth daily.     levothyroxine (SYNTHROID, LEVOTHROID) 75 MCG tablet Take 1 tablet (75 mcg total) by mouth daily before breakfast. 90 tablet 3   losartan (COZAAR) 50 MG tablet Take 1 tablet (50 mg total) by mouth daily. 30 tablet 0   metoprolol tartrate (LOPRESSOR) 100 MG tablet Take tablet TWO hour prior to your cardiac CT scan. 1 tablet 0   montelukast (SINGULAIR) 10 MG tablet TAKE 1 TABLET BY MOUTH EVERYDAY AT BEDTIME 90 tablet 3   Nebulizers (COMPRESSOR/NEBULIZER) MISC Use as directed 1 each 0   nicotine (NICODERM CQ - DOSED IN MG/24 HOURS) 14 mg/24hr patch 2nd month after completing 21 mg/24 hr patches 28 patch 0   nicotine (NICODERM CQ - DOSED IN MG/24 HOURS) 21 mg/24hr patch Start first 28 patch 0   nicotine (NICODERM CQ - DOSED IN  MG/24 HR) 7 mg/24hr patch Use for the 3rd month 28 patch 0   nitroGLYCERIN (NITROSTAT) 0.4 MG SL tablet Place 1 tablet (0.4 mg total) under the tongue every 5 (five) minutes as needed for chest pain. 25 tablet 3   NOVOLOG FLEXPEN 100 UNIT/ML FlexPen SMARTSIG:12 Unit(s) SUB-Q     omeprazole (PRILOSEC) 40 MG capsule Take 40 mg by mouth every morning.     paroxetine mesylate (PEXEVA) 20 MG tablet Take 20 mg by mouth daily.     PAXIL 20 MG tablet Take 20 mg by mouth daily.     promethazine (PHENERGAN) 12.5 MG tablet Take 12.5 mg by mouth every 8 (eight) hours as needed.     propranolol (INDERAL) 20 MG tablet Take 1 tablet (20 mg total) by mouth 3 (three) times daily. 270 tablet 2   Respiratory Therapy Supplies (NEBULIZER/TUBING/MOUTHPIECE) KIT Use as directed 10 kit 11   rosuvastatin (CRESTOR) 10 MG tablet Take 1 tablet (10 mg total) by mouth daily. 90 tablet 3   terconazole (TERAZOL 3) 0.8 % vaginal cream Place 1 applicator vaginally at bedtime.     traZODone (DESYREL) 50 MG tablet Take 50-100 mg by mouth at bedtime.     Vitamin D, Ergocalciferol, (DRISDOL) 1.25 MG (50000 UNIT) CAPS capsule Take 50,000 Units by mouth once a week.      ziprasidone (GEODON) 60 MG capsule Take 60 mg by mouth daily.     diphenhydrAMINE (BENADRYL) 50 MG tablet Take 1 tablet (50 mg total) by mouth once for 1 dose. Take one hour prior to test. 1 tablet 0   Facility-Administered Medications Prior to Visit  Medication Dose Route Frequency Provider Last Rate Last Admin   clindamycin (CLEOCIN) 2 % vaginal cream 1 Applicatorful  1 Applicatorful Vaginal QHS Jonnie Kind, MD         Review of Systems:   Constitutional:   No  weight loss, night sweats,  Fevers, chills, + fatigue, or  lassitude.  HEENT:   No headaches,  Difficulty swallowing,  Tooth/dental problems, or  Sore throat,                No sneezing, itching, ear ache, nasal congestion, post nasal drip,   CV:  No  Orthopnea, PND, swelling in lower extremities, anasarca, dizziness, palpitations, syncope.   GI  No heartburn, indigestion, abdominal pain, nausea, vomiting, diarrhea, change in bowel habits, loss of appetite, bloody stools.   Resp:  No chest wall deformity  Skin: no rash or lesions.  GU: no dysuria, change in color of urine, no urgency or frequency.  No flank pain, no hematuria   MS:  No joint pain or swelling.  No decreased range of motion.  No back pain.    Physical Exam  BP 106/80 (BP Location: Left Arm, Patient Position: Sitting, Cuff Size: Large)   Pulse 89   Temp 98.1 F (36.7 C) (Oral)   Ht _0  (1.6 m)   Wt 190 lb (86.2 kg)   SpO2 96%   BMI 33.66 kg/m   GEN: A/Ox3; pleasant , NAD, well nourished    HEENT:  [place]/AT,  EACs-clear, TMs-wnl, NOSE-clear, THROAT-clear, no lesions, no postnasal drip or exudate noted. Poor dentition   NECK:  Supple w/ fair ROM; no JVD; normal carotid impulses w/o bruits; no thyromegaly or nodules palpated; no lymphadenopathy.    RESP  Clear  P & A; w/o, wheezes/ rales/ or rhonchi. no accessory muscle use, no dullness to percussion  CARD:  RRR, no m/r/g, no peripheral edema, pulses intact, no cyanosis or clubbing.  GI:    Soft & nt; nml bowel sounds; no organomegaly or masses detected.   Musco: Warm bil, no deformities or joint swelling noted.   Neuro: alert, no focal deficits noted.    Skin: Warm, no lesions or rashes    Lab Results:    BMET   BNP No results found for: "BNP"  ProBNP No results found for: "PROBNP"  Imaging: DG Chest 2 View  Result Date: 03/11/2022 CLINICAL DATA:  Chest discomfort.  Cough. EXAM: CHEST - 2 VIEW COMPARISON:  Chest two views 05/27/2021 FINDINGS: Cardiac silhouette and mediastinal contours are within normal limits. Mild bilateral chronic interstitial thickening is unchanged from prior. No new acute airspace opacity. No pleural effusion pneumothorax. Moderate multilevel degenerative disc space narrowing and endplate osteophytes of the thoracic spine. IMPRESSION: 1. No acute cardiopulmonary process. 2. Mild bilateral chronic interstitial thickening is unchanged from prior. Electronically Signed   By: Yvonne Kendall M.D.   On: 03/11/2022 15:32          No data to display          Lab Results  Component Value Date   NITRICOXIDE 5 01/26/2018        Assessment & Plan:   ASTHMATIC BRONCHITIS, ACUTE Acute asthmatic bronchitic exacerbation slow to resolve.  Chest x-ray without acute process. Will have patient finish up Z-Pak.  Begin Breztri inhaler twice daily.  May use nebulizer as needed  Plan  Patient Instructions  Finish Zpack as directed Begin Breztri 2 puffs Twice daily, rinse after use .  Use Duoneb as needed  Activity as tolerated.  Mucinex DM Twice daily  As needed  Cough/cogestion .  Tessalon Three times a day  As needed  cough .  Work on not smoking .  If chest pain returns will need to go to ER for evaluation  Follow up with cardiology tomorrow as planned for coronary CT  Follow up for dentist in January as planned  Follow up with Dr. Chase Caller with PFT in 4-6 weeks and As needed   Please contact office for sooner follow up if symptoms do  not improve or worsen or seek emergency care      TOBACCO ABUSE Smoking cessation discussed in detail  CHEST PAIN-UNSPECIFIED Recurrent atypical chest pain and tightness.  Questionable etiology.  Suspect is related to underlying asthmatic bronchitic flare. Chest x-ray without acute process.  EKG with nonspecific changes.  No acute process noted.  Patient is advised if she continues to have ongoing chest pain advised her to have a full work-up in the emergency room.  Continue follow-up with cardiology.  She does have a coronary CT chest for chest pain tomorrow.  Plan  Patient Instructions  Finish Zpack as directed Begin Breztri 2 puffs Twice daily, rinse after use .  Use Duoneb as needed  Activity as tolerated.  Mucinex DM Twice daily  As needed  Cough/cogestion .  Tessalon Three times a day  As needed  cough .  Work on not smoking .  If chest pain returns will need to go to ER for evaluation  Follow up with cardiology tomorrow as planned for coronary CT  Follow up for dentist in January as planned  Follow up with Dr. Chase Caller with PFT in 4-6 weeks and As needed   Please contact office for sooner follow up if symptoms do not improve or worsen or seek emergency care  Dental caries Dental caries.  Patient has an appointment set up with dentistry in January to have teeth removed.  She has multiple broken teeth and visible caries.  This most likely is contributed to recurrent asthmatic bronchitic exacerbations    I spent   40 minutes dedicated to the care of this patient on the date of this encounter to include pre-visit review of records, face-to-face time with the patient discussing conditions above, post visit ordering of testing, clinical documentation with the electronic health record, making appropriate referrals as documented, and communicating necessary findings to members of the patients care team.   Rexene Edison, NP 03/11/2022

## 2022-03-11 NOTE — Telephone Encounter (Signed)
Called and spoke with patient, she is having pain in her chest and has had it since yesterday, it comes and goes and is sharpe and uncomfortable.  She denies any radiation to her arm or neck.  She also denies any nausea.  ?She has a history of tachycardia, however, states she is on medication and that is under control.  She verbalized that she did get her zpack and has started that and is taking all the meds prescribed.  She is also smoking 1 ppd.  I spoke with [name] and she said to have her come in 30 minutes and get a stat CXR.  I spoke with the patient and provided this information to her as well.  She verbalized understanding.  patient states she would still like to come in for her appointment. still having symptoms. please call patient back [phone]

## 2022-03-11 NOTE — Patient Instructions (Addendum)
Finish Zpack as directed [employer] 2 puffs Twice daily, rinse after use .  Use Duoneb as needed  Activity as tolerated.  Mucinex DM Twice daily  As needed  Cough/cogestion .  Tessalon Three times a day  As needed  cough .  Work on not smoking .  If chest pain returns will need to go to ER for evaluation  Follow up with cardiology tomorrow as planned for coronary CT  Follow up for dentist in January as planned  Follow up with Dr. Marchelle Gearing with PFT in 4-6 weeks and As needed   Please contact office for sooner follow up if symptoms do not improve or worsen or seek emergency care

## 2022-03-11 NOTE — Telephone Encounter (Signed)
Attempted to call patient regarding upcoming cardiac CT appointment. °Left message on voicemail with name and callback number °Jamauri Kruzel RN Navigator Cardiac Imaging °[facility] °[phone] Office °[phone] Cell ° °

## 2022-03-11 NOTE — Telephone Encounter (Signed)
ATC patient regarding OV appointment this afternoon.  Emily [name] sent in medication for her yesterday and we are needing clarification on the reason for the OV this afternoon.  Patient needs an OV with Dr. Isaiah Serge in December and he does have openings.  LVM to return call.

## 2022-03-11 NOTE — Assessment & Plan Note (Signed)
Dental caries.  Patient has an appointment set up with dentistry in January to have teeth removed.  She has multiple broken teeth and visible caries.  This most likely is contributed to recurrent asthmatic bronchitic exacerbations

## 2022-03-12 ENCOUNTER — Encounter: Payer: Self-pay | Admitting: Cardiovascular Disease

## 2022-03-12 ENCOUNTER — Ambulatory Visit (HOSPITAL_COMMUNITY)
Admission: RE | Admit: 2022-03-12 | Discharge: 2022-03-12 | Disposition: A | Discharge status: Home / Self Care | Payer: Medicaid Other | Source: Ambulatory Visit | Attending: Cardiovascular Disease | Admitting: Cardiovascular Disease

## 2022-03-12 DIAGNOSIS — R072 Precordial pain: Secondary | ICD-10-CM | POA: Insufficient documentation

## 2022-03-12 DIAGNOSIS — R079 Chest pain, unspecified: Secondary | ICD-10-CM | POA: Insufficient documentation

## 2022-03-12 MED ORDER — DILTIAZEM HCL 25 MG/5ML IV SOLN
INTRAVENOUS | Status: AC
  Filled 2022-03-12: qty 5

## 2022-03-12 MED ORDER — METOPROLOL TARTRATE 5 MG/5ML IV SOLN
INTRAVENOUS | Status: AC
  Filled 2022-03-12: qty 10

## 2022-03-12 MED ORDER — DILTIAZEM HCL 25 MG/5ML IV SOLN
10.0000 mg | Freq: Once | INTRAVENOUS | Status: AC
  Administered 2022-03-12: 10 mg via INTRAVENOUS

## 2022-03-12 MED ORDER — IOHEXOL 350 MG/ML SOLN
100.0000 mL | Freq: Once | INTRAVENOUS | Status: AC | PRN
  Administered 2022-03-12: 100 mL via INTRAVENOUS

## 2022-03-12 MED ORDER — NITROGLYCERIN 0.4 MG SL SUBL: 0.8000 mg | SUBLINGUAL_TABLET | Freq: Once | SUBLINGUAL | Status: AC

## 2022-03-12 MED ORDER — METOPROLOL TARTRATE 5 MG/5ML IV SOLN
10.0000 mg | Freq: Once | INTRAVENOUS | Status: AC
  Administered 2022-03-12: 10 mg via INTRAVENOUS

## 2022-03-12 MED ORDER — METOPROLOL TARTRATE 5 MG/5ML IV SOLN
INTRAVENOUS | Status: AC
  Administered 2022-03-12: 10 mg via INTRAVENOUS
  Filled 2022-03-12: qty 10

## 2022-03-12 MED ORDER — NITROGLYCERIN 0.4 MG SL SUBL
SUBLINGUAL_TABLET | SUBLINGUAL | Status: AC
  Administered 2022-03-12: 0.8 mg via SUBLINGUAL
  Filled 2022-03-12: qty 2

## 2022-03-12 MED ORDER — METOPROLOL TARTRATE 5 MG/5ML IV SOLN: 10.0000 mg | Freq: Once | INTRAVENOUS | Status: AC

## 2022-03-17 ENCOUNTER — Telehealth: Payer: Self-pay | Admitting: Cardiovascular Disease

## 2022-03-17 NOTE — Telephone Encounter (Signed)
  Patient would like to know the results of her CT

## 2022-03-17 NOTE — Telephone Encounter (Signed)
Spoke with patient via MyChart encounter. Will contact her when results are available along with physician's interpretation.

## 2022-03-18 ENCOUNTER — Telehealth (HOSPITAL_COMMUNITY): Payer: Self-pay | Admitting: Emergency Medicine

## 2022-03-18 ENCOUNTER — Telehealth: Payer: Self-pay | Admitting: Cardiovascular Disease

## 2022-03-18 DIAGNOSIS — E785 Hyperlipidemia, unspecified: Secondary | ICD-10-CM

## 2022-03-18 DIAGNOSIS — Z91041 Radiographic dye allergy status: Secondary | ICD-10-CM

## 2022-03-18 DIAGNOSIS — I2510 Atherosclerotic heart disease of native coronary artery without angina pectoris: Secondary | ICD-10-CM

## 2022-03-18 DIAGNOSIS — R079 Chest pain, unspecified: Secondary | ICD-10-CM

## 2022-03-18 MED ORDER — PREDNISONE 50 MG PO TABS: ORAL_TABLET | ORAL | 0 refills | Status: DC

## 2022-03-18 MED ORDER — IVABRADINE HCL 5 MG PO TABS: ORAL_TABLET | ORAL | 0 refills | Status: DC

## 2022-03-18 MED ORDER — DIPHENHYDRAMINE HCL 50 MG PO TABS: 50.0000 mg | ORAL_TABLET | Freq: Once | ORAL | 0 refills | Status: DC

## 2022-03-18 NOTE — Telephone Encounter (Signed)
-----   Message from Vesta Mixer, MD sent at 03/17/2022  2:17 PM EST ----- The study is nondiagnostic.  It appears that she took a breath in the middle of the scan . If she would be willing to come back and have a repeat scan, we can do it without additional charge  (Send message to Dr. Jacques Navy and/ or Rockwell Alexandria if she wants to repeat it.   If not, we can do a cardiac PET scan ( which will likely take several months to schedule )

## 2022-03-18 NOTE — Telephone Encounter (Signed)
Pt r/s for ccta after first scan was nondiagnostic,   Resent rx to [facility] so she only has one stop for both meds (ivabradine and prednisone)  Appt made for 03/28/22 at 1:00pm  Pt verbalized understanding and appreciated the call  Rockwell Alexandria RN Navigator Cardiac Imaging [facility] [phone] Office  [phone] Cell

## 2022-03-18 NOTE — Telephone Encounter (Signed)
Called and spoke with patient. She does want to do the repeat scan. Order placed at this time and message forwarded to Rockwell Alexandria and Jacques Navy. Pt has contrast allergy on file, medication sent to pharmacy for pre-treatment.

## 2022-03-20 ENCOUNTER — Other Ambulatory Visit (HOSPITAL_COMMUNITY): Payer: Self-pay

## 2022-03-20 ENCOUNTER — Telehealth: Payer: Self-pay

## 2022-03-20 NOTE — Telephone Encounter (Signed)
PA request received via CMM for Breztri inhaler through [facility].   PA not submitted due to needing to be sent through [employer].  PA submitted through [employer] and is awaiting determination.   Confirmation #:[id number] W

## 2022-03-21 ENCOUNTER — Other Ambulatory Visit (HOSPITAL_COMMUNITY): Payer: Self-pay

## 2022-03-21 NOTE — Telephone Encounter (Signed)
Please let patient know that insurance will not cover BREZTRI   We will have to switch to Advair discus 251 puff twice daily, rinse after used #1 with 5 refills Incruse Ellipta 2 puffs daily, rinse after use

## 2022-03-21 NOTE — Telephone Encounter (Signed)
PA request for Breztri through [facility] has been DENIED.  Preferred alternatives to complete triple therapy are Brand Advair Diskus and Incruse Ellipta.

## 2022-03-26 ENCOUNTER — Telehealth (HOSPITAL_COMMUNITY): Payer: Self-pay | Admitting: Emergency Medicine

## 2022-03-26 NOTE — Telephone Encounter (Signed)
Attempted to call patient regarding upcoming cardiac CT appointment. °Left message on voicemail with name and callback number °Jaiana Sheffer RN Navigator Cardiac Imaging °[facility] °[phone] Office °[phone] Cell ° °

## 2022-03-27 ENCOUNTER — Ambulatory Visit (HOSPITAL_COMMUNITY): Admission: RE | Admit: 2022-03-27 | Payer: Medicaid Other | Source: Ambulatory Visit

## 2022-03-27 ENCOUNTER — Other Ambulatory Visit: Payer: Self-pay | Admitting: *Deleted

## 2022-04-11 MED ORDER — FLUTICASONE-SALMETEROL 250-50 MCG/ACT IN AEPB: 1.0000 | INHALATION_SPRAY | Freq: Two times a day (BID) | RESPIRATORY_TRACT | 5 refills | Status: DC

## 2022-04-11 MED ORDER — INCRUSE ELLIPTA 62.5 MCG/ACT IN AEPB: 2.0000 | INHALATION_SPRAY | Freq: Every day | RESPIRATORY_TRACT | 5 refills | Status: DC

## 2022-04-11 NOTE — Telephone Encounter (Addendum)
Mychart message sent to patient to make her aware of the change in inhaler, the reason for the change and that two different inhalers have been sent to her pharmacy.  Advised to call with any questions.  ATC x2.  Line was busy.

## 2022-04-11 NOTE — Addendum Note (Signed)
Addended by: Delrae Rend on: 04/11/2022 03:27 PM   Modules accepted: Orders

## 2022-04-16 MED ORDER — AZITHROMYCIN 250 MG PO TABS: ORAL_TABLET | ORAL | 0 refills | Status: DC

## 2022-04-16 NOTE — Telephone Encounter (Signed)
Mychart messages sent by pt: Lezly L Derflinger  P [facility] (supporting [name] S Parrett, NP)5 minutes ago (10:41 AM)    None of that [facility] [address] A Powers, CMA  Nicey L Peche1 hour ago (9:41 AM)    Good Morning,  Any fever, coughing, shortness of breath? How long have the symptoms been present for.  What pharmacy are you currently using.     Iria L Magnan  [facility] (supporting [name] S Parrett, NP)16 hours ago (6:31 PM)    I was hoping you could send me in a zpack I got some green snot blowing my nose coughing up some stuff please let me know thanks       [name], please advise if you are okay with [place] sending zpak to pharmacy for pt.

## 2022-04-16 NOTE — Telephone Encounter (Signed)
Mucinex DM Twice daily  As needed  cough/congestion  Saline nasal rinses As needed    Zpack #1 take as directed -to have on hold if symptoms worsen with discolored mucus .  Will need ov if not improving .   Please contact office for sooner follow up if symptoms do not improve or worsen or seek emergency care

## 2022-04-22 MED ORDER — MONTELUKAST SODIUM 10 MG PO TABS: 10.0000 mg | ORAL_TABLET | Freq: Every day | ORAL | 3 refills | Status: DC

## 2022-04-22 NOTE — Addendum Note (Signed)
Addended by: Lorretta Harp on: 04/22/2022 04:56 PM   Modules accepted: Orders

## 2022-04-30 ENCOUNTER — Encounter: Payer: Self-pay | Admitting: Adult Health

## 2022-04-30 ENCOUNTER — Ambulatory Visit (INDEPENDENT_AMBULATORY_CARE_PROVIDER_SITE_OTHER): Payer: Medicaid Other | Admitting: Adult Health

## 2022-04-30 VITALS — BP 114/82 | HR 77 | Ht 63.0 in | Wt 192.5 lb

## 2022-04-30 DIAGNOSIS — N926 Irregular menstruation, unspecified: Secondary | ICD-10-CM | POA: Diagnosis not present

## 2022-04-30 DIAGNOSIS — N750 Cyst of Bartholin's gland: Secondary | ICD-10-CM

## 2022-04-30 MED ORDER — AZITHROMYCIN 250 MG PO TABS: ORAL_TABLET | ORAL | 0 refills | Status: DC

## 2022-04-30 NOTE — Progress Notes (Signed)
  Subjective:     Patient ID: Hoy Finlay, female   DOB: [birth year], 42 y.o.   MRN: [id number]  HPI Katryna is a 42 year old white female,single, G1P0010, in complaining of cyst on vagina. She says she has cirrhosis of the liver and has not had period in about 4 months. She says she had normal pap in 2023. Has not had sex in over a year.  PCP is Kathleen Lime PA  Review of Systems ?cyst right side of vagina Feels dry in vagina at times No period in 4 months, no sex in over a year Reviewed past medical,surgical, social and family history. Reviewed medications and allergies.     Objective:   Physical Exam BP 114/82 (BP Location: Left Arm, Patient Position: Sitting, Cuff Size: Normal)   Pulse 77   Ht 5\' 3"  (1.6 m)   Wt 192 lb 8 oz (87.3 kg)   LMP 01/03/2022 (Approximate)   BMI 34.10 kg/m     Skin warm and dry.Pelvic: external genitalia is normal in appearance no lesions, vagina: pale pink, has marble sized cyst right vaginal wall, is draining some,from opening, no odor,urethra has no lesions or masses noted, cervix:smooth, uterus: normal size, shape and contour, non tender, no masses felt, adnexa: no masses or tenderness noted. Bladder is non tender and no masses felt.  Fall risk is low  Upstream - 04/30/22 1407       Pregnancy Intention Screening   Does the patient want to become pregnant in the next year? No    Does the patient's partner want to become pregnant in the next year? No    Would the patient like to discuss contraceptive options today? No      Contraception Wrap Up   Current Method Abstinence    End Method Abstinence    Contraception Counseling Provided No            Examination chaperoned by Levy Pupa LPN  Assessment:     1. Bartholin cyst Draining already Use warm compresses Will rx azithromycin, has has numerous allergies Meds ordered this encounter  Medications   azithromycin (ZITHROMAX) 250 MG tablet    Sig: Take 2 po now and 1 daily for 4  days    Dispense:  6 each    Refill:  0    Order Specific Question:   Supervising Provider    Answer:   Elonda Husky, LUTHER H [[id number]]     2. Irregular periods Prolactin level was 34.6 04/26/22 at doctor in [place]:      Follow up in 1 week for recheck

## 2022-05-02 ENCOUNTER — Ambulatory Visit: Payer: Medicaid Other | Attending: General Practice | Admitting: General Practice

## 2022-05-02 ENCOUNTER — Encounter: Payer: Self-pay | Admitting: Cardiovascular Disease

## 2022-05-02 ENCOUNTER — Telehealth: Payer: Self-pay

## 2022-05-02 ENCOUNTER — Encounter: Payer: Self-pay | Admitting: General Practice

## 2022-05-02 VITALS — BP 112/78 | HR 70 | Ht 63.0 in | Wt 191.0 lb

## 2022-05-02 DIAGNOSIS — Z720 Tobacco use: Secondary | ICD-10-CM

## 2022-05-02 DIAGNOSIS — I10 Essential (primary) hypertension: Secondary | ICD-10-CM

## 2022-05-02 DIAGNOSIS — R609 Edema, unspecified: Secondary | ICD-10-CM

## 2022-05-02 DIAGNOSIS — R079 Chest pain, unspecified: Secondary | ICD-10-CM

## 2022-05-02 DIAGNOSIS — R072 Precordial pain: Secondary | ICD-10-CM | POA: Diagnosis present

## 2022-05-02 DIAGNOSIS — E785 Hyperlipidemia, unspecified: Secondary | ICD-10-CM | POA: Diagnosis present

## 2022-05-02 MED ORDER — IVABRADINE HCL 5 MG PO TABS: ORAL_TABLET | ORAL | 0 refills | Status: DC

## 2022-05-02 NOTE — Progress Notes (Signed)
Cardiology Clinic Note   Patient Name: Emily [name] Date of Encounter: 05/02/2022  Primary Care Provider:  Harlene Salts, PA-C Primary Cardiologist:  Mertie Moores, MD  Patient Profile    Emily [name] 42 y.o. female presents for a follow up of her chest pain.   Past Medical History    Past Medical History:  Diagnosis Date   Abnormal CT of liver    Anxiety    Asthma    Asthma    Bipolar 1 disorder (HCC)    Bipolar disorder (HCC)    Chronic pain    Cirrhosis (HCC)    Depression    Fatty liver    GERD (gastroesophageal reflux disease)    Headache(784.0)    Hyperlipidemia    Hypertension    Pulmonary nodule    Tachycardia    Thyroid disease    Past Surgical History:  Procedure Laterality Date   DILATION AND CURETTAGE OF UTERUS     1 yr ago    Allergies  Allergies  Allergen Reactions   Bactrim [Sulfamethoxazole-Trimethoprim] Anaphylaxis and Other (See Comments)    Chest pain, trouble breathing   Ciprofloxacin Palpitations   Clindamycin/Lincomycin     Other reaction(s): Chest Pain   Flagyl [Metronidazole] Shortness Of Breath   Iodinated Contrast Media Swelling   Ondansetron Shortness Of Breath   Liraglutide Nausea And Vomiting   Doxycycline Nausea And Vomiting   Penicillins    Prednisone Other (See Comments)    Makes her angry and irritable    History of Present Illness    Sarena L Harpham has a PMH of essential hypertension, varicose veins, asthma, pulmonary nodule, chronic bronchitis, GERD, dental caries, type 2 diabetes, tobacco abuse, depression, cough, vitamin D deficiency, and HLD.  She was seen in follow-up by Dr. Acie Fredrickson on 10/24/2021.  During that time she continued to smoke.  She complained of chest discomfort.  She has known chronic frequent chest pains.  She has had several negative Myoview studies.  She was previously scheduled for coronary CTA but was a no-show several times in the past.  She had lost 20 pounds.  She was drinking 6  [name] per day and enjoys sweet tea.  She was noted to have 1+ lower extremity left leg edema.  She previously had a venous duplex and she was found to have chronic venous insufficiency in her left leg.  The importance of weight loss, increased physical activity, lower extremity compression status and leg elevation were discussed.  Smoking cessation was also discussed.  A coronary CTA was ordered but was inconclusive 03/12/2022 due to patient taking a breath in the middle of the scan.  It was felt that she would be able to come back in for repeat study/scan without additional charge or Dr. Acie Fredrickson recommended cardiac PET scan for further prognostication.  She contacted the nurse triage line today 05/02/2022 in complaint of heart fluttering, left-sided intermittent chest discomfort when she bends over and chest tightness.  She denied shortness of breath and referred pain.  She reported that she took 1 nitroglycerin yesterday and felt better.  She reported that her symptoms recurred with bending over.  She was added to my schedule.  She presents to the clinic today for evaluation and states over the last few days she has noticed intermittent periods of chest discomfort.  She notices chest discomforts when she bends over.  They will last for few seconds and dissipate.  She reports that she has also  taken 1 nitroglycerin which was effective in pain relief.  She reports intermittent periods of brief palpitations that last for seconds and  dissipate without intervention.  We reviewed her previous echocardiogram, cardiac event monitor, and echocardiogram.  She and her dad expressed understanding.  We reviewed her previous coronary CTA which was nondiagnostic.  Her discomfort appears to be related to reflux type symptoms and her irregular heartbeats/palpitations appear to be related to PVCs.  Due to her inconclusive previous coronary CTA I recommended that she repeat the test.  Shared decision making was used to  agreed to proceed.  She request that she change cardiologist from Dr. Elease Hashimoto to Dr. Antoine Poche.  She indicates that she would like to be seen in the same office as her dad and her dad also sees Dr. Antoine Poche.  We will plan follow-up for after her coronary CTA.  Today she denies fatigue, palpitations, melena, hematuria, hemoptysis, diaphoresis, weakness, presyncope, syncope, orthopnea, and PND.   Home Medications    Prior to Admission medications   Medication Sig Start Date End Date Taking? Authorizing Provider  ACCU-CHEK AVIVA PLUS test strip 1 each by Other route 3 (three) times daily. 04/28/20   [provider]  ACCU-CHEK FASTCLIX LANCETS MISC USE AS DIRECTED 12/10/17   Tysinger, Kermit Balo, PA-C  albuterol (VENTOLIN HFA) 108 (90 Base) MCG/ACT inhaler Inhale 2 puffs into the lungs every 6 (six) hours as needed for wheezing or shortness of breath. 12/12/20   Bevelyn Ngo, NP  amitriptyline (ELAVIL) 25 MG tablet Take 50 mg by mouth at bedtime. 08/29/19   [provider]  amLODipine (NORVASC) 5 MG tablet Take 1 tablet (5 mg total) by mouth daily. 12/09/21   Nahser, Deloris Ping, MD  atorvastatin (LIPITOR) 40 MG tablet Take 40 mg by mouth daily. 02/05/21   [provider]  azithromycin (ZITHROMAX) 250 MG tablet Take 2 po now and 1 daily for 4 days 04/30/22   Adline Potter, NP  benzonatate (TESSALON) 200 MG capsule Take 1 capsule (200 mg total) by mouth 3 (three) times daily as needed. 03/11/22 03/11/23  Parrett, Virgel Bouquet, NP  benztropine (COGENTIN) 1 MG tablet Take 1 mg by mouth 2 (two) times daily. 03/28/20   [provider]  Blood Glucose Monitoring Suppl (ACCU-CHEK AVIVA PLUS) w/Device KIT USE AS DIRECTED 05/01/16   Tysinger, Kermit Balo, PA-C  clonazePAM (KLONOPIN) 0.5 MG disintegrating tablet Take 0.5 mg by mouth daily as needed. 11/22/19   [provider]  diphenhydrAMINE (BENADRYL) 50 MG tablet Take 1 tablet (50 mg total) by mouth once for 1 dose. Take one hour  prior to test. 03/18/22 03/18/22  Nahser, Deloris Ping, MD  fluticasone-salmeterol (ADVAIR) 250-50 MCG/ACT AEPB Inhale 1 puff into the lungs every 12 (twelve) hours. Rinse after use. 04/11/22   Parrett, Virgel Bouquet, NP  HUMALOG KWIKPEN 100 UNIT/ML KwikPen SUB-Q 3 Times Daily 07/05/21   [provider]  Insulin Pen Needle 32G X 4 MM MISC 1 each by Does not apply route as needed. 08/08/19   [provider]  ipratropium-albuterol (DUONEB) 0.5-2.5 (3) MG/3ML SOLN Take 3 mLs by nebulization in the morning, at noon, and at bedtime. 02/14/22   Parrett, Virgel Bouquet, NP  ivabradine (CORLANOR) 5 MG TABS tablet Take tablets (15mg ) TWO hours prior to your cardiac CT scan. 03/18/22   Nahser, 03/20/22, MD  lamoTRIgine (LAMICTAL) 150 MG tablet Take 150 mg by mouth daily.    [provider]  levothyroxine (SYNTHROID, LEVOTHROID) 75  MCG tablet Take 1 tablet (75 mcg total) by mouth daily before breakfast. 02/22/15   Tysinger, [name], PA-C  losartan (COZAAR) 50 MG tablet Take 1 tablet (50 mg total) by mouth daily. 11/08/20   Nahser, Wonda Cheng, MD  montelukast (SINGULAIR) 10 MG tablet Take 1 tablet (10 mg total) by mouth at bedtime. 04/22/22   Parrett, Fonnie Mu, NP  Nebulizers (COMPRESSOR/NEBULIZER) MISC Use as directed 11/15/19   Brand Males, MD  nitroGLYCERIN (NITROSTAT) 0.4 MG SL tablet Place 1 tablet (0.4 mg total) under the tongue every 5 (five) minutes as needed for chest pain. 03/10/22   Nahser, Wonda Cheng, MD  omeprazole (PRILOSEC) 40 MG capsule Take 40 mg by mouth every morning. 08/05/21   [provider]  paroxetine mesylate (PEXEVA) 20 MG tablet Take 20 mg by mouth daily.    [provider]  PAXIL 20 MG tablet Take 20 mg by mouth daily. 08/29/19   [provider]  promethazine (PHENERGAN) 12.5 MG tablet Take 12.5 mg by mouth every 8 (eight) hours as needed. 09/24/21   [provider]  propranolol (INDERAL) 20 MG tablet Take 1 tablet (20 mg total) by mouth 3 (three)  times daily. 10/14/21   Nahser, Wonda Cheng, MD  Respiratory Therapy Supplies (NEBULIZER/TUBING/MOUTHPIECE) KIT Use as directed 11/15/19   Brand Males, MD  rosuvastatin (CRESTOR) 10 MG tablet Take 1 tablet (10 mg total) by mouth daily. 08/03/20   Nahser, Wonda Cheng, MD  traZODone (DESYREL) 50 MG tablet Take 50-100 mg by mouth at bedtime. 10/01/21   [provider]  umeclidinium bromide (INCRUSE ELLIPTA) 62.5 MCG/ACT AEPB Inhale 2 puffs into the lungs daily. Rinse after use. 04/11/22   Parrett, Fonnie Mu, NP  Vitamin D, Ergocalciferol, (DRISDOL) 1.25 MG (50000 UNIT) CAPS capsule Take 50,000 Units by mouth once a week. 06/11/21   [provider]  ziprasidone (GEODON) 60 MG capsule Take 60 mg by mouth daily. 05/22/20   [provider]    Family History    Family History  Problem Relation Age of Onset   Asthma Mother    Valvular heart disease Father        Mitral valve repair Dr. Roxy Manns 2007   Diabetes Maternal Grandmother    Hypertension Maternal Grandmother    She indicated that her mother is alive. She indicated that her father is alive. She indicated that her sister is alive. She indicated that her brother is alive. She indicated that her maternal grandmother is deceased. She indicated that her maternal grandfather is deceased. She indicated that her paternal grandmother is deceased. She indicated that her paternal grandfather is deceased.  Social History    Social History   Socioeconomic History   Marital status: Single    Spouse name: Not on file   Number of children: Not on file   Years of education: Not on file   Highest education level: Not on file  Occupational History   Not on file  Tobacco Use   Smoking status: Every Day    Packs/day: 1.50    Years: 20.00    Total pack years: 30.00    Types: Cigarettes   Smokeless tobacco: Never   Tobacco comments:    Smoking a pack per day.  03/11/2022 hfb  Vaping Use   Vaping Use: Never used  Substance and Sexual  Activity   Alcohol use: No   Drug use: No   Sexual activity: Not Currently    Partners: Male    Birth  control/protection: Abstinence  Other Topics Concern   Not on file  Social History Narrative   Not on file   Social Determinants of Health   Financial Resource Strain: Not on file  Food Insecurity: Not on file  Transportation Needs: Not on file  Physical Activity: Not on file  Stress: Not on file  Social Connections: Not on file  Intimate Partner Violence: Not on file     Review of Systems    General:  No chills, fever, night sweats or weight changes.  Cardiovascular:  No chest pain, dyspnea on exertion, edema, orthopnea, palpitations, paroxysmal nocturnal dyspnea. Dermatological: No rash, lesions/masses Respiratory: No cough, dyspnea Urologic: No hematuria, dysuria Abdominal:   No nausea, vomiting, diarrhea, bright red blood per rectum, melena, or hematemesis Neurologic:  No visual changes, wkns, changes in mental status. All other systems reviewed and are otherwise negative except as noted above.  Physical Exam    VS:  BP 112/78   Pulse 70   Ht 5\' 3"  (1.6 m)   Wt 191 lb (86.6 kg)   LMP 01/03/2022 (Approximate)   SpO2 96%   BMI 33.83 kg/m  , BMI Body mass index is 33.83 kg/m. GEN: Well nourished, well developed, in no acute distress. HEENT: normal. Neck: Supple, no JVD, carotid bruits, or masses. Cardiac: RRR, no murmurs, rubs, or gallops. No clubbing, cyanosis, generalized left lower extremity nonpitting edema.  Radials/DP/PT 2+ and equal bilaterally.  Respiratory:  Respirations regular and unlabored, clear to auscultation bilaterally. GI: Soft, nontender, nondistended, BS + x 4. MS: no deformity or atrophy. Skin: warm and dry, no rash. Neuro:  Strength and sensation are intact. Psych: Normal affect.  Accessory Clinical Findings    Recent Labs: 07/16/2021: ALT 87 10/24/2021: BUN 3; Creatinine, Ser 0.53; Potassium 3.9; Sodium 137   Recent Lipid Panel     Component Value Date/Time   CHOL 185 07/16/2021 1412   TRIG 277 (H) 07/16/2021 1412   HDL 36 (L) 07/16/2021 1412   CHOLHDL 5.1 (H) 07/16/2021 1412   CHOLHDL 8.7 (H) 02/20/2015 0001   VLDL NOT CALC 02/20/2015 0001   LDLCALC 102 (H) 07/16/2021 1412         ECG personally reviewed by me today-normal sinus rhythm T wave abnormality consider anterior ischemia 70 bpm- No acute changes  Echocardiogram 07/25/2021  IMPRESSIONS     1. Left ventricular ejection fraction, by estimation, is 60 to 65%. The  left ventricle has normal function. The left ventricle has no regional  wall motion abnormalities. Left ventricular diastolic parameters were  normal.   2. Right ventricular systolic function is normal. The right ventricular  size is normal.   3. The mitral valve is normal in structure. No evidence of mitral valve  regurgitation. No evidence of mitral stenosis.   4. The aortic valve was not well visualized. Aortic valve regurgitation  is not visualized. No aortic stenosis is present.   5. Not well seen but normal gradient by CW.   6. The inferior vena cava is normal in size with greater than 50%  respiratory variability, suggesting right atrial pressure of 3 mmHg.   FINDINGS   Left Ventricle: Left ventricular ejection fraction, by estimation, is 60  to 65%. The left ventricle has normal function. The left ventricle has no  regional wall motion abnormalities. The left ventricular internal cavity  size was normal in size. There is   no left ventricular hypertrophy. Left ventricular diastolic parameters  were normal.   Right Ventricle: The  right ventricular size is normal. No increase in  right ventricular wall thickness. Right ventricular systolic function is  normal.   Left Atrium: Left atrial size was normal in size.   Right Atrium: Right atrial size was normal in size.   Pericardium: There is no evidence of pericardial effusion.   Mitral Valve: The mitral valve is normal in  structure. No evidence of  mitral valve regurgitation. No evidence of mitral valve stenosis.   Tricuspid Valve: The tricuspid valve is normal in structure. Tricuspid  valve regurgitation is trivial. No evidence of tricuspid stenosis.   Aortic Valve: The aortic valve was not well visualized. Aortic valve  regurgitation is not visualized. No aortic stenosis is present.   Pulmonic Valve: Not well seen but normal gradient by CW. The pulmonic  valve was not well visualized. Pulmonic valve regurgitation is not  visualized. No evidence of pulmonic stenosis.   Aorta: The aortic root is normal in size and structure.   Venous: The inferior vena cava is normal in size with greater than 50%  respiratory variability, suggesting right atrial pressure of 3 mmHg.   IAS/Shunts: The interatrial septum was not well visualized.   Nuclear stress test 08/13/2020  Nuclear stress EF: 64%. There was no ST segment deviation noted during stress. The left ventricular ejection fraction is normal (55-65%). The study is normal. This is a low risk study.   Coronary CTA 03/12/2022  FINDINGS: Coronary calcium score: The patient's coronary artery calcium score is 56, which places the patient in the 99th percentile.   Coronary arteries: Normal coronary origins.  Right dominance.   Right Coronary Artery: Normal caliber vessel, gives rise to PDA. No significant plaque or stenosis.   Left Main Coronary Artery: Not visualized on contrast images due to slab artifact. Appears to have normal origin on noncontrast images.   Left Anterior Descending Coronary Artery: Ostial LAD not visualized due to slab artifact. Proximal LAD with mixed calcified and noncalcified plaque with 1-24% stenosis. Gives rise to 2 small diagonal branches.   Left Circumflex Artery: Normal caliber vessel. Proximal vessel with mixed calcified and noncalcified plaque with 1-24% stenosis. Gives rise to 2 OM branches.   Aorta: Normal size,  28 mm at the mid ascending aorta (level of the PA bifurcation) measured double oblique. No aortic atherosclerosis. No dissection seen in visualized portions of the aorta.   Aortic Valve: No calcifications. Trileaflet.   Other findings:   Normal pulmonary vein drainage into the left atrium.   Normal left atrial appendage without a thrombus.   Normal size of the pulmonary artery.   Normal appearance of the pericardium.   Significant slab artifact.   IMPRESSION: 1. CAD-RADS N: Non-diagnostic study. Obstructive CAD can't be excluded in left main due to significant slab artifact.   2. Coronary calcium score of 56. This was 99th percentile for age and sex matched control.   3. Based on calcium score images, there is normal coronary origin/anatomy. However, on contrast images, there is significant slab artifact, and the left main and ostial LAD cannot be visualized.   INTERPRETATION:   CAD-RADS N: Non-diagnostic study. Obstructive CAD can't be excluded. Alternative evaluation is recommended.     Electronically Signed   By: Jodelle Red M.D.   On: 03/13/2022 19:13  Assessment & Plan   1.  Precordial pain, atypical chest discomfort-notes intermittent episodes of chest discomfort with bending over.  Notes relief in her discomfort with sublingual nitroglycerin x 1 and dressed.  Episodes last  a few seconds and dissipate.  Previously had coronary CTA which was nondiagnostic due to patient taking a breath in the middle of the exam.  During that time patient was offered a repeat scan at no cost.  She was added to my schedule today.  Unclear whether chest discomfort is related to GERD, MSK, or is precordial in nature. Repeat coronary CTA Continue sublingual nitroglycerin as needed for sustained episodes of chest discomfort.  Essential hypertension-BP today 112/78 Continue amlodipine, losartan, propranolol Heart healthy low-sodium diet-salty 6 given Increase physical  activity as tolerated  Hyperlipidemia-LDL 102 on 07/16/2021 compliant with atorvastatin Heart healthy low-sodium high-fiber diet Increase physical activity as tolerated  Lower extremity edema-continues to have generalized left lower extremity nonpitting edema.  Previously had venous duplex and was noted to have chronic venous insufficiency in her left leg. Lower extremity support stockings Elevate lower leg when not active Continue weight loss  Tobacco abuse-continues to smoke cigarettes. Smoking cessation strongly encouraged Smoking cessation instructions given  Disposition: Follow-up with Dr. Elease Hashimoto after coronary CTA.   Thomasene Ripple. Murel Wigle NP-C     05/02/2022, 4:34 PM [facility] [address] Office [phone] Fax [phone]    I spent 14 minutes examining this patient, reviewing medications, and using patient centered shared decision making involving her cardiac care.  Prior to her visit I spent greater than 20 minutes reviewing her past medical history,  medications, and prior cardiac tests.

## 2022-05-02 NOTE — Telephone Encounter (Signed)
Heart "fluttering", L-sided, intermittent chest pain when she bends over, chest tightness. Denies SOB, N/V, no referred pain. States she took 1 NTG yesterday and felt relief. States pain was resolved until this morning, recurred when she was bending over at home.   Pt scheduled to see Coletta Memos, NP today at [place] office at 3:10pm.

## 2022-05-02 NOTE — Patient Instructions (Addendum)
Medication Instructions:  STOP Rosuvastatin TAKE  Ivabradine 5 mg 2 hours prior to Coronary CT  *If you need a refill on your cardiac medications before your next appointment, please call your pharmacy*  Lab Work: Your physician recommends that you return for lab work TODAY:  BMP CBC  If you have labs (blood work) drawn today and your tests are completely normal, you will receive your results only by: MyChart Message (if you have MyChart) OR A paper copy in the mail If you have any lab test that is abnormal or we need to change your treatment, we will call you to review the results.  Testing/Procedures:   Your cardiac CT will be scheduled at one of the below locations:   [facility] [address] [place], [place] [address] [phone]  OR  [facility] [address] [place], [place] [address] [phone]  OR   [facility] [address] [place], [place] [address] [phone]  If scheduled at [facility], please arrive at the [facility] and Children's Entrance (Entrance C2) of [facility] 30 minutes prior to test start time. You can use the FREE valet parking offered at entrance C (encouraged to control the heart rate for the test)  Proceed to the [facility] Radiology Department (first floor) to check-in and test prep.  All radiology patients and guests should use entrance C2 at [facility], accessed from [facility], even though the hospital's physical address listed is [address]    If scheduled at [facility] or [facility], please arrive 15 mins early for check-in and test prep.   Please follow these instructions carefully (unless otherwise directed):  Hold all erectile dysfunction medications at least 3 days (72 hrs) prior to test. (Ie viagra, cialis, sildenafil, tadalafil, etc) We  will administer nitroglycerin during this exam.   On the Night Before the Test: Be sure to Drink plenty of water. Do not consume any caffeinated/decaffeinated beverages or chocolate 12 hours prior to your test. Do not take any antihistamines 12 hours prior to your test. If the patient has contrast allergy: Patient will need a prescription for Prednisone and very clear instructions (as follows): Prednisone 50 mg - take 13 hours prior to test Take another Prednisone 50 mg 7 hours prior to test Take another Prednisone 50 mg 1 hour prior to test Take Benadryl 50 mg 1 hour prior to test Patient must complete all four doses of above prophylactic medications. Patient will need a ride after test due to Benadryl.  On the Day of the Test: Drink plenty of water until 1 hour prior to the test. Do not eat any food 1 hour prior to test. You may take your regular medications prior to the test.  Take metoprolol (Lopressor) two hours prior to test. HOLD Furosemide/Hydrochlorothiazide morning of the test. FEMALES- please wear underwire-free bra if available, avoid dresses & tight clothing       After the Test: Drink plenty of water. After receiving IV contrast, you may experience a mild flushed feeling. This is normal. On occasion, you may experience a mild rash up to 24 hours after the test. This is not dangerous. If this occurs, you can take Benadryl 25 mg and increase your fluid intake. If you experience trouble breathing, this can be serious. If it is severe call 911 IMMEDIATELY. If it is mild, please call our office. If  you take any of these medications: Glipizide/Metformin, Avandament, Glucavance, please do not take 48 hours after completing test unless otherwise instructed.  We will call to schedule your test 2-4 weeks out understanding that some insurance companies will need an authorization prior to the service being performed.   For non-scheduling related questions, please contact the  cardiac imaging nurse navigator should you have any questions/concerns: Rockwell Alexandria, Cardiac Imaging Nurse Navigator Larey Brick, Cardiac Imaging Nurse Navigator [facility] Direct Office Dial: [phone]   For scheduling needs, including cancellations and rescheduling, please call [place], [phone].    Follow-Up: At [facility], you and your health needs are our priority.  As part of our continuing mission to provide you with exceptional heart care, we have created designated Provider Care Teams.  These Care Teams include your primary Cardiologist (physician) and Advanced Practice Providers (APPs -  Physician Assistants and Nurse Practitioners) who all work together to provide you with the care you need, when you need it.  We recommend signing up for the patient portal called "MyChart".  Sign up information is provided on this After Visit Summary.  MyChart is used to connect with patients for Virtual Visits (Telemedicine).  Patients are able to view lab/test results, encounter notes, upcoming appointments, etc.  Non-urgent messages can be sent to your provider as well.   To learn more about what you can do with MyChart, go to [email or web address].    Your next appointment:   Follow up after Coronary CT    Provider:   Dr. Antoine Poche      Other Instructions CUTBACK and/or STOP drinking Dr. Reino Kent   Steps to Quit Smoking  Smoking tobacco is the leading cause of preventable death. It can affect almost every organ in the body. Smoking puts you and people around you at risk for many serious, long-lasting (chronic) diseases. Quitting smoking can be hard, but it is one of the best things that you can do for your health. It is never too late to quit. Do not give up if you cannot quit the first time. Some people need to try many times to quit. Do your best to stick to your quit plan, and talk with your doctor if you have any questions or  concerns. How do I get ready to quit? Pick a date to quit. Set a date within the next 2 weeks to give you time to prepare. Write down the reasons why you are quitting. Keep this list in places where you will see it often. Tell your family, friends, and co-workers that you are quitting. Their support is important. Talk with your doctor about the choices that may help you quit. Find out if your health insurance will pay for these treatments. Know the people, places, things, and activities that make you want to smoke (triggers). Avoid them. What first steps can I take to quit smoking? Throw away all cigarettes at home, at work, and in your car. Throw away the things that you use when you smoke, such as ashtrays and lighters. Clean your car. Empty the ashtray. Clean your home, including curtains and carpets. What can I do to help me quit smoking? Talk with your doctor about taking medicines and seeing a counselor. You are more likely to succeed when you do both. If you are pregnant or breastfeeding: Talk with your doctor about counseling or other ways to quit smoking. Do not take medicine to help you quit smoking unless your doctor tells you  to. Quit right away Quit smoking completely, instead of slowly cutting back on how much you smoke over a period of time. Stopping smoking right away may be more successful than slowly quitting. Go to counseling. In-person is best if this is an option. You are more likely to quit if you go to counseling sessions regularly. Take medicine You may take medicines to help you quit. Some medicines need a prescription, and some you can buy over-the-counter. Some medicines may contain a drug called nicotine to replace the nicotine in cigarettes. Medicines may: Help you stop having the desire to smoke (cravings). Help to stop the problems that come when you stop smoking (withdrawal symptoms). Your doctor may ask you to use: Nicotine patches, gum, or  lozenges. Nicotine inhalers or sprays. Non-nicotine medicine that you take by mouth. Find resources Find resources and other ways to help you quit smoking and remain smoke-free after you quit. They include: Online chats with a Veterinary surgeon. Phone quitlines. Printed Materials engineer. Support groups or group counseling. Text messaging programs. Mobile phone apps. Use apps on your mobile phone or tablet that can help you stick to your quit plan. Examples of free services include Quit Guide from the CDC and smokefree.gov  What can I do to make it easier to quit?  Talk to your family and friends. Ask them to support and encourage you. Call a phone quitline, such as 1-800-QUIT-NOW, reach out to support groups, or work with a Veterinary surgeon. Ask people who smoke to not smoke around you. Avoid places that make you want to smoke, such as: Bars. Parties. Smoke-break areas at work. Spend time with people who do not smoke. Lower the stress in your life. Stress can make you want to smoke. Try these things to lower stress: Getting regular exercise. Doing deep-breathing exercises. Doing yoga. Meditating. What benefits will I see if I quit smoking? Over time, you may have: A better sense of smell and taste. Less coughing and sore throat. A slower heart rate. Lower blood pressure. Clearer skin. Better breathing. Fewer sick days. Summary Quitting smoking can be hard, but it is one of the best things that you can do for your health. Do not give up if you cannot quit the first time. Some people need to try many times to quit. When you decide to quit smoking, make a plan to help you succeed. Quit smoking right away, not slowly over a period of time. When you start quitting, get help and support to keep you smoke-free. This information is not intended to replace advice given to you by your health care provider. Make sure you discuss any questions you have with your health care provider. Document  Revised: 03/29/2021 Document Reviewed: 03/29/2021 Elsevier Patient Education  2023 Elsevier Inc.  Food Choices for Gastroesophageal Reflux Disease, Adult  When you have gastroesophageal reflux disease (GERD), the foods you eat and your eating habits are very important. Choosing the right foods can help ease the discomfort of GERD. Consider working with a dietitian to help you make healthy food choices. What are tips for following this plan? Reading food labels Look for foods that are low in saturated fat. Foods that have less than 5% of daily value (DV) of fat and 0 g of trans fats may help with your symptoms. Cooking Cook foods using methods other than frying. This may include baking, steaming, grilling, or broiling. These are all methods that do not need a lot of fat for cooking. To add flavor, try to  use herbs that are low in spice and acidity. Meal planning  Choose healthy foods that are low in fat, such as fruits, vegetables, whole grains, low-fat dairy products, lean meats, fish, and poultry. Eat frequent, small meals instead of three large meals each day. Eat your meals slowly, in a relaxed setting. Avoid bending over or lying down until 2-3 hours after eating. Limit high-fat foods such as fatty meats or fried foods. Limit your intake of fatty foods, such as oils, butter, and shortening. Avoid the following as told by your health care provider: Foods that cause symptoms. These may be different for different people. Keep a food diary to keep track of foods that cause symptoms. Alcohol. Drinking large amounts of liquid with meals. Eating meals during the 2-3 hours before bed. Lifestyle Maintain a healthy weight. Ask your health care provider what weight is healthy for you. If you need to lose weight, work with your health care provider to do so safely. Exercise for at least 30 minutes on 5 or more days each week, or as told by your health care provider. Avoid wearing clothes that fit  tightly around your waist and chest. Do not use any products that contain nicotine or tobacco. These products include cigarettes, chewing tobacco, and vaping devices, such as e-cigarettes. If you need help quitting, ask your health care provider. Sleep with the head of your bed raised. Use a wedge under the mattress or blocks under the bed frame to raise the head of the bed. Chew sugar-free gum after mealtimes. What foods should I eat?  Eat a healthy, well-balanced diet of fruits, vegetables, whole grains, low-fat dairy products, lean meats, fish, and poultry. Each person is different. Foods that may trigger symptoms in one person may not trigger any symptoms in another person. Work with your health care provider to identify foods that are safe for you. The items listed above may not be a complete list of recommended foods and beverages. Contact a dietitian for more information. What foods should I avoid? Limiting some of these foods may help manage the symptoms of GERD. Everyone is different. Consult a dietitian or your health care provider to help you identify the exact foods to avoid, if any. Fruits Any fruits prepared with added fat. Any fruits that cause symptoms. For some people this may include citrus fruits, such as oranges, grapefruit, pineapple, and lemons. Vegetables Deep-fried vegetables. [place] fries. Any vegetables prepared with added fat. Any vegetables that cause symptoms. For some people, this may include tomatoes and tomato products, chili peppers, onions and garlic, and horseradish. Grains Pastries or quick breads with added fat. Meats and other proteins High-fat meats, such as fatty beef or pork, hot dogs, ribs, ham, sausage, salami, and bacon. Fried meat or protein, including fried fish and fried chicken. Nuts and nut butters, in large amounts. Dairy Whole milk and chocolate milk. Sour cream. Cream. Ice cream. Cream cheese. Milkshakes. Fats and oils Butter. Margarine.  Shortening. Ghee. Beverages Coffee and tea, with or without caffeine. Carbonated beverages. Sodas. Energy drinks. Fruit juice made with acidic fruits, such as orange or grapefruit. Tomato juice. Alcoholic drinks. Sweets and desserts Chocolate and cocoa. Donuts. Seasonings and condiments Pepper. Peppermint and spearmint. Added salt. Any condiments, herbs, or seasonings that cause symptoms. For some people, this may include curry, hot sauce, or vinegar-based salad dressings. The items listed above may not be a complete list of foods and beverages to avoid. Contact a dietitian for more information. Questions to ask your  health care provider Diet and lifestyle changes are usually the first steps that are taken to manage symptoms of GERD. If diet and lifestyle changes do not improve your symptoms, talk with your health care provider about taking medicines. Where to find more information International Foundation for Gastrointestinal Disorders: aboutgerd.org Summary When you have gastroesophageal reflux disease (GERD), food and lifestyle choices may be very helpful in easing the discomfort of GERD. Eat frequent, small meals instead of three large meals each day. Eat your meals slowly, in a relaxed setting. Avoid bending over or lying down until 2-3 hours after eating. Limit high-fat foods such as fatty meats or fried foods. This information is not intended to replace advice given to you by your health care provider. Make sure you discuss any questions you have with your health care provider. Document Revised: 10/17/2019 Document Reviewed: 10/17/2019 Elsevier Patient Education  2023 [place] Diet A bland diet may consist of soft foods or foods that are not high in fat or are not greasy, acidic, or spicy. Avoiding certain foods may cause less irritation to your mouth, throat, stomach, or gastrointestinal tract. Avoiding certain foods may make you feel better. Everyone's tolerances are  different. A bland diet should be based on what you can tolerate and what may cause discomfort. What is my plan? Your health care provider or dietitian may recommend specific changes to your diet to treat your symptoms. These changes may include: Eating small meals frequently. Cooking food until it is soft enough to chew easily. Taking the time to chew your food thoroughly, so it is easy to swallow and digest. Avoiding foods that cause you discomfort. These may include spicy food, fried food, greasy foods, hard-to-chew foods, or citrus fruits and juices. Drinking slowly. What are tips for following this plan? Reading food labels To reduce fiber intake, look for food labels that say "whole," such as whole wheat or whole grain. Shopping Avoid food items that may have nuts or seeds. Avoid vegetables that may make you gassy or have a tough texture, such as broccoli, cauliflower, or corn. Cooking Cook foods thoroughly so they have a soft texture. Meal planning Make sure you include foods from all food groups to eat a balanced diet. Eat a variety of types of foods. Eat foods and drink beverages that do not cause you discomfort. These may include soups and broths with cooked meats, pasta, and vegetables. Lifestyle Sit up after meals, avoid tight clothing, and take time to eat and chew your food slowly. Ask your health care provider whether you should take dietary supplements. General information Mildly season your foods. Some seasonings, such as cayenne pepper, vinegar, or hot sauce, may cause irritation. The foods, beverages, or seasonings to avoid should be based on individual tolerance. What foods should I eat? Fruits Canned or cooked fruit such as peaches, pears, or applesauce. Bananas. Vegetables Well-cooked vegetables. Canned or cooked vegetables such as carrots, green beans, beets, or spinach. Mashed or boiled potatoes. Grains  Hot cereals, such as cream of wheat and processed  oatmeal. Rice. Bread, crackers, pasta, or tortillas made from refined white flour. Meats and other proteins  Eggs. Creamy peanut butter or other nut butters. Lean, well-cooked tender meats, such as beef, pork, chicken, or fish. Dairy Low-fat dairy products such as milk, cottage cheese, or yogurt. Beverages  Water. Herbal tea. Apple juice. Fats and oils Mild salad dressings. [place] or olive oil. Sweets and desserts Low-fat pudding, custard, or ice cream. Fruit gelatin. The  items listed above may not be a complete list of foods and beverages you can eat. Contact a dietitian for more information. What foods should I avoid? Fruits Citrus fruits, such as oranges and grapefruit. Fruits with a stringy texture. Fruits that have lots of seeds, such as kiwi or strawberries. Dried fruits. Vegetables Raw, uncooked vegetables. Salads. Grains Whole grain breads, muffins, and cereals. Meats and other proteins Tough, fibrous meats. Highly seasoned meat such as corned beef, smoked meats, or fish. Processed high-fat meats such as brats, hot dogs, or sausage. Dairy Full-fat dairy foods such as ice cream and cheese. Beverages Caffeinated drinks. Alcohol. Seasonings and condiments Strongly flavored seasonings or condiments. Hot sauce. Salsa. Other foods Spicy foods. Fried or greasy foods. Sour foods, such as pickled or fermented foods like sauerkraut. Foods high in fiber. The items listed above may not be a complete list of foods and beverages you should avoid. Contact a dietitian for more information. Summary A bland diet should be based on individual tolerance. It may consist of foods that are soft textured and do not have a lot of fat, fiber, acid, or seasonings. A bland diet may be recommended because avoiding certain foods, beverages, or spices may make you feel better. This information is not intended to replace advice given to you by your health care provider. Make sure you discuss any questions  you have with your health care provider. Document Revised: 02/25/2021 Document Reviewed: 02/25/2021 Elsevier Patient Education  2023 [employer].

## 2022-05-03 LAB — CBC
Hematocrit: 33.9 % — ABNORMAL LOW (ref 34.0–46.6)
Hemoglobin: 11.9 g/dL (ref 11.1–15.9)
MCH: 40.3 pg — ABNORMAL HIGH (ref 26.6–33.0)
MCHC: 35.1 g/dL (ref 31.5–35.7)
MCV: 115 fL — ABNORMAL HIGH (ref 79–97)
Platelets: 221 10*3/uL (ref 150–450)
RBC: 2.95 x10E6/uL — ABNORMAL LOW (ref 3.77–5.28)
RDW: 11.9 % (ref 11.7–15.4)
WBC: 10.3 10*3/uL (ref 3.4–10.8)

## 2022-05-03 LAB — BASIC METABOLIC PANEL
BUN/Creatinine Ratio: 7 — ABNORMAL LOW (ref 9–23)
BUN: 5 mg/dL — ABNORMAL LOW (ref 6–24)
CO2: 22 mmol/L (ref 20–29)
Calcium: 9 mg/dL (ref 8.7–10.2)
Chloride: 98 mmol/L (ref 96–106)
Creatinine, Ser: 0.67 mg/dL (ref 0.57–1.00)
Glucose: 206 mg/dL — ABNORMAL HIGH (ref 70–99)
Potassium: 4.4 mmol/L (ref 3.5–5.2)
Sodium: 135 mmol/L (ref 134–144)
eGFR: 113 mL/min/{1.73_m2} (ref >59)

## 2022-05-05 ENCOUNTER — Encounter (HOSPITAL_COMMUNITY): Payer: Self-pay

## 2022-05-05 ENCOUNTER — Other Ambulatory Visit: Payer: Self-pay | Admitting: *Deleted

## 2022-05-05 DIAGNOSIS — J42 Unspecified chronic bronchitis: Secondary | ICD-10-CM

## 2022-05-05 DIAGNOSIS — J45909 Unspecified asthma, uncomplicated: Secondary | ICD-10-CM

## 2022-05-06 ENCOUNTER — Ambulatory Visit: Payer: Medicaid Other | Admitting: Internal Medicine

## 2022-05-06 NOTE — Progress Notes (Deleted)
OV 05/06/2022  Subjective:  Patient ID: Emily [name], female , DOB: 09-Nov-1980 , age 42 y.o. , MRN: [id number] , ADDRESS: [address] [place] [address] PCP Awanda Mink Algernon Huxley, PA-C Patient Care Team: Cannon Kettle as PCP - General Nahser, Wonda Cheng, MD as PCP - Cardiology (Cardiology)  This Provider for this visit: Treatment Team:  Attending Provider: Brand Males, MD    05/06/2022 -  No chief complaint on file.    HPI Sarenity L Mcraney 42 y.o. -    CT Chest data  No results found.    PFT      No data to display             has a past medical history of Abnormal CT of liver, Anxiety, Asthma, Asthma, Bipolar 1 disorder ([place]), Bipolar disorder ([place]), Chronic pain, Cirrhosis ([name]), Depression, Fatty liver, GERD (gastroesophageal reflux disease), Headache(784.0), Hyperlipidemia, Hypertension, Pulmonary nodule, Tachycardia, and Thyroid disease.   reports that she has been smoking cigarettes. She has a 30.00 pack-year smoking history. She has never used smokeless tobacco.  Past Surgical History:  Procedure Laterality Date   DILATION AND CURETTAGE OF UTERUS     1 yr ago    Allergies  Allergen Reactions   Bactrim [Sulfamethoxazole-Trimethoprim] Anaphylaxis and Other (See Comments)    Chest pain, trouble breathing   Ciprofloxacin Palpitations   Clindamycin/Lincomycin     Other reaction(s): Chest Pain   Flagyl [Metronidazole] Shortness Of Breath   Iodinated Contrast Media Swelling   Ondansetron Shortness Of Breath   Liraglutide Nausea And Vomiting   Doxycycline Nausea And Vomiting   Penicillins    Prednisone Other (See Comments)    Makes her angry and irritable    Immunization History  Administered Date(s) Administered   Influenza Split 03/22/2014   Influenza Whole 01/13/2008   Influenza, Seasonal, Injecte, Preservative Fre 04/18/2015   Influenza,inj,Quad PF,6+ Mos 04/18/2015, 01/08/2022   PFIZER(Purple Top)SARS-COV-2  Vaccination 03/12/2020, 04/16/2020   Pneumococcal Polysaccharide-23 05/10/2012   Tdap 01/02/2006, 08/08/2015    Family History  Problem Relation Age of Onset   Asthma Mother    Valvular heart disease Father        Mitral valve repair Dr. Roxy Manns 2007   Diabetes Maternal Grandmother    Hypertension Maternal Grandmother      Current Outpatient Medications:    ACCU-CHEK AVIVA PLUS test strip, 1 each by Other route 3 (three) times daily., Disp: , Rfl:    ACCU-CHEK FASTCLIX LANCETS MISC, USE AS DIRECTED, Disp: 102 each, Rfl: 3   albuterol (VENTOLIN HFA) 108 (90 Base) MCG/ACT inhaler, Inhale 2 puffs into the lungs every 6 (six) hours as needed for wheezing or shortness of breath., Disp: 18 g, Rfl: 1   amitriptyline (ELAVIL) 25 MG tablet, Take 50 mg by mouth at bedtime., Disp: , Rfl:    amLODipine (NORVASC) 5 MG tablet, Take 1 tablet (5 mg total) by mouth daily., Disp: 90 tablet, Rfl: 3   atorvastatin (LIPITOR) 40 MG tablet, Take 40 mg by mouth daily., Disp: , Rfl:    azithromycin (ZITHROMAX) 250 MG tablet, Take 2 po now and 1 daily for 4 days, Disp: 6 each, Rfl: 0   benzonatate (TESSALON) 200 MG capsule, Take 1 capsule (200 mg total) by mouth 3 (three) times daily as needed., Disp: 45 capsule, Rfl: 1   benztropine (COGENTIN) 1 MG tablet, Take 1 mg by mouth 2 (two) times daily., Disp: , Rfl:  Blood Glucose Monitoring Suppl (ACCU-CHEK AVIVA PLUS) w/Device KIT, USE AS DIRECTED, Disp: 1 kit, Rfl: 0   clonazePAM (KLONOPIN) 0.5 MG disintegrating tablet, Take 0.5 mg by mouth daily as needed., Disp: , Rfl:    diphenhydrAMINE (BENADRYL) 50 MG tablet, Take 1 tablet (50 mg total) by mouth once for 1 dose. Take one hour prior to test., Disp: 1 tablet, Rfl: 0   fluticasone-salmeterol (ADVAIR) 250-50 MCG/ACT AEPB, Inhale 1 puff into the lungs every 12 (twelve) hours. Rinse after use., Disp: 60 each, Rfl: 5   HUMALOG KWIKPEN 100 UNIT/ML KwikPen, SUB-Q 3 Times Daily, Disp: , Rfl:    Insulin Pen Needle 32G X 4  MM MISC, 1 each by Does not apply route as needed., Disp: , Rfl:    ipratropium-albuterol (DUONEB) 0.5-2.5 (3) MG/3ML SOLN, Take 3 mLs by nebulization in the morning, at noon, and at bedtime., Disp: 270 mL, Rfl: 11   ivabradine (CORLANOR) 5 MG TABS tablet, Take tablets (74m) TWO hours prior to your cardiac CT scan., Disp: 3 tablet, Rfl: 0   lamoTRIgine (LAMICTAL) 150 MG tablet, Take 150 mg by mouth daily., Disp: , Rfl:    levothyroxine (SYNTHROID, LEVOTHROID) 75 MCG tablet, Take 1 tablet (75 mcg total) by mouth daily before breakfast., Disp: 90 tablet, Rfl: 3   losartan (COZAAR) 50 MG tablet, Take 1 tablet (50 mg total) by mouth daily., Disp: 30 tablet, Rfl: 0   montelukast (SINGULAIR) 10 MG tablet, Take 1 tablet (10 mg total) by mouth at bedtime., Disp: 90 tablet, Rfl: 3   Nebulizers (COMPRESSOR/NEBULIZER) MISC, Use as directed, Disp: 1 each, Rfl: 0   nitroGLYCERIN (NITROSTAT) 0.4 MG SL tablet, Place 1 tablet (0.4 mg total) under the tongue every 5 (five) minutes as needed for chest pain., Disp: 25 tablet, Rfl: 3   omeprazole (PRILOSEC) 40 MG capsule, Take 40 mg by mouth every morning., Disp: , Rfl:    paroxetine mesylate (PEXEVA) 20 MG tablet, Take 20 mg by mouth daily., Disp: , Rfl:    PAXIL 20 MG tablet, Take 20 mg by mouth daily., Disp: , Rfl:    promethazine (PHENERGAN) 12.5 MG tablet, Take 12.5 mg by mouth every 8 (eight) hours as needed., Disp: , Rfl:    propranolol (INDERAL) 20 MG tablet, Take 1 tablet (20 mg total) by mouth 3 (three) times daily., Disp: 270 tablet, Rfl: 2   Respiratory Therapy Supplies (NEBULIZER/TUBING/MOUTHPIECE) KIT, Use as directed, Disp: 10 kit, Rfl: 11   traZODone (DESYREL) 50 MG tablet, Take 50-100 mg by mouth at bedtime., Disp: , Rfl:    umeclidinium bromide (INCRUSE ELLIPTA) 62.5 MCG/ACT AEPB, Inhale 2 puffs into the lungs daily. Rinse after use., Disp: 60 each, Rfl: 5   Vitamin D, Ergocalciferol, (DRISDOL) 1.25 MG (50000 UNIT) CAPS capsule, Take 50,000 Units by  mouth once a week., Disp: , Rfl:    ziprasidone (GEODON) 60 MG capsule, Take 60 mg by mouth daily., Disp: , Rfl:   Current Facility-Administered Medications:    clindamycin (CLEOCIN) 2 % vaginal cream 1 Applicatorful, 1 Applicatorful, Vaginal, QHS, FJonnie Kind MD      Objective:   There were no vitals filed for this visit.  Estimated body mass index is 33.83 kg/m as calculated from the following:   Height as of 05/02/22: 5' 3"$  (1.6 m).   Weight as of 05/02/22: 191 lb (86.6 kg).  @WEIGHTCHANGE$ @  There were no vitals filed for this visit.   Physical Exam  General Appearance:    Alert,  cooperative, no distress, appears stated age - *** , Deconditioned looking - *** , OBESE  - ***, Sitting on Wheelchair -  ***  Head:    Normocephalic, without obvious abnormality, atraumatic  Eyes:    PERRL, conjunctiva/corneas clear,  Ears:    Normal TM's and external ear canals, both ears  Nose:   Nares normal, septum midline, mucosa normal, no drainage    or sinus tenderness. OXYGEN ON  - *** . Patient is @ ***   Throat:   Lips, mucosa, and tongue normal; teeth and gums normal. Cyanosis on lips - ***  Neck:   Supple, symmetrical, trachea midline, no adenopathy;    thyroid:  no enlargement/tenderness/nodules; no carotid   bruit or JVD  Back:     Symmetric, no curvature, ROM normal, no CVA tenderness  Lungs:     Distress - *** , Wheeze ***, Barrell Chest - ***, Purse lip breathing - ***, Crackles - ***   Chest Wall:    No tenderness or deformity.    Heart:    Regular rate and rhythm, S1 and S2 normal, no rub   or gallop, Murmur - ***  Breast Exam:    NOT DONE  Abdomen:     Soft, non-tender, bowel sounds active all four quadrants,    no masses, no organomegaly. Visceral obesity - ***  Genitalia:   NOT DONE  Rectal:   NOT DONE  Extremities:   Extremities - normal, Has Cane - ***, Clubbing - ***, Edema - ***  Pulses:   2+ and symmetric all extremities  Skin:   Stigmata of Connective Tissue  Disease - ***  Lymph nodes:   Cervical, supraclavicular, and axillary nodes normal  Psychiatric:  Neurologic:   Pleasant - ***, Anxious - ***, Flat affect - ***  CAm-ICU - neg, Alert and Oriented x 3 - yes, Moves all 4s - yes, Speech - normal, Cognition - intact    General: No distress. *** Neuro: Alert and Oriented x 3. GCS 15. Speech normal Psych: Pleasant Resp:  Barrel Chest - ***.  Wheeze - ***, Crackles - ***, No overt respiratory distress CVS: Normal heart sounds. Murmurs - *** Ext: Stigmata of Connective Tissue Disease - *** HEENT: Normal upper airway. PEERL +. No post nasal drip        Assessment:     No diagnosis found.     Plan:     There are no Patient Instructions on file for this visit.    SIGNATURE    Dr. Brand Males, M.D., F.C.C.P,  Pulmonary and Critical Care Medicine Staff Physician, [facility] Director - Interstitial Lung Disease  Program  Pulmonary [place] at [place], [place], [address]  Pager: [phone], If no answer or between  15:00h - 7:00h: call [phone] Telephone: [phone]  12:40 PM 05/06/2022

## 2022-05-06 NOTE — Telephone Encounter (Signed)
Updated patients chart °

## 2022-05-07 ENCOUNTER — Encounter: Payer: Self-pay | Admitting: Adult Health

## 2022-05-07 ENCOUNTER — Ambulatory Visit: Payer: Medicaid Other | Admitting: Adult Health

## 2022-05-07 VITALS — BP 105/78 | HR 74 | Ht 63.0 in | Wt 192.0 lb

## 2022-05-07 DIAGNOSIS — N750 Cyst of Bartholin's gland: Secondary | ICD-10-CM | POA: Diagnosis not present

## 2022-05-07 NOTE — Progress Notes (Signed)
  Subjective:     Patient ID: Emily [name], female   DOB: 10-09-1980, 42 y.o.   MRN: [id number]  HPI Emily [name] is a 42 year old white female,single, G1P0010, back in follow up on bartholin cyst was prescribed azithromycin and she says it is better, no discharge or pain.  PCP is Emily Hess PA.  Review of Systems Feels better, no pain or discharge    Reviewed past medical,surgical, social and family history. Reviewed medications and allergies.  Objective:   Physical Exam BP 105/78 (BP Location: Left Arm, Patient Position: Sitting, Cuff Size: Normal)   Pulse 74   Ht 5\' 3"  (1.6 m)   Wt 192 lb (87.1 kg)   LMP 01/03/2022 (Approximate)   BMI 34.01 kg/m     Skin warm and dry.Pelvic: external genitalia is normal in appearance no lesions, vagina: pink, no discharge, and the bartholin cyst on right is not tender, still a little firmness on side wall.  Fall risk is low  [facility] - 05/07/22 1338       Pregnancy Intention Screening   Does the patient want to become pregnant in the next year? No    Does the patient's partner want to become pregnant in the next year? No    Would the patient like to discuss contraceptive options today? No      Contraception Wrap Up   Current Method Abstinence    End Method Abstinence    Contraception Counseling Provided No            Examination chaperoned by Emily Pupa LPN  Assessment:     1. Bartholin cyst, resolved    Plan:     Follow up prn

## 2022-05-12 ENCOUNTER — Telehealth (HOSPITAL_COMMUNITY): Payer: Self-pay | Admitting: Emergency Medicine

## 2022-05-12 NOTE — Telephone Encounter (Signed)
Attempted to call patient regarding upcoming cardiac CT appointment. Left message on voicemail with name and callback number Marchia Bond RN Navigator Cardiac Section [facility] [phone] Office [phone] Cell

## 2022-05-13 ENCOUNTER — Ambulatory Visit (HOSPITAL_COMMUNITY): Admission: RE | Admit: 2022-05-13 | Payer: Medicaid Other | Source: Ambulatory Visit

## 2022-05-23 ENCOUNTER — Other Ambulatory Visit: Payer: Self-pay

## 2022-05-23 ENCOUNTER — Emergency Department (HOSPITAL_COMMUNITY)
Admission: EM | Admit: 2022-05-23 | Discharge: 2022-05-23 | Discharge status: Against Medical Advice | Payer: Medicaid Other | Attending: Emergency Medicine | Admitting: Emergency Medicine

## 2022-05-23 ENCOUNTER — Encounter (HOSPITAL_COMMUNITY): Payer: Self-pay | Admitting: *Deleted

## 2022-05-23 ENCOUNTER — Emergency Department (HOSPITAL_COMMUNITY): Payer: Medicaid Other

## 2022-05-23 DIAGNOSIS — R0789 Other chest pain: Secondary | ICD-10-CM | POA: Diagnosis present

## 2022-05-23 DIAGNOSIS — Z5321 Procedure and treatment not carried out due to patient leaving prior to being seen by health care provider: Secondary | ICD-10-CM | POA: Insufficient documentation

## 2022-05-23 LAB — BASIC METABOLIC PANEL
Anion gap: 11 (ref 5–15)
BUN: <5 mg/dL — ABNORMAL LOW (ref 6–20)
CO2: 24 mmol/L (ref 22–32)
Calcium: 8.9 mg/dL (ref 8.9–10.3)
Chloride: 97 mmol/L — ABNORMAL LOW (ref 98–111)
Creatinine, Ser: 0.58 mg/dL (ref 0.44–1.00)
GFR, Estimated: >60 mL/min (ref >60)
Glucose, Bld: 205 mg/dL — ABNORMAL HIGH (ref 70–99)
Potassium: 3.6 mmol/L (ref 3.5–5.1)
Sodium: 132 mmol/L — ABNORMAL LOW (ref 135–145)

## 2022-05-23 LAB — TROPONIN I (HIGH SENSITIVITY)
Troponin I (High Sensitivity): 4 ng/L (ref <18)
Troponin I (High Sensitivity): 3 ng/L (ref <18)

## 2022-05-23 LAB — CBC
HCT: 37.5 % (ref 36.0–46.0)
Hemoglobin: 13.3 g/dL (ref 12.0–15.0)
MCH: 41.7 pg — ABNORMAL HIGH (ref 26.0–34.0)
MCHC: 35.5 g/dL (ref 30.0–36.0)
MCV: 117.6 fL — ABNORMAL HIGH (ref 80.0–100.0)
Platelets: 236 10*3/uL (ref 150–400)
RBC: 3.19 MIL/uL — ABNORMAL LOW (ref 3.87–5.11)
RDW: 12.1 % (ref 11.5–15.5)
WBC: 12.1 10*3/uL — ABNORMAL HIGH (ref 4.0–10.5)
nRBC: 0 % (ref 0.0–0.2)

## 2022-05-23 NOTE — ED Notes (Signed)
Pt not present at this time. Name roomed by mistake.

## 2022-05-23 NOTE — ED Triage Notes (Signed)
Pt c/o chest fluttering yesterday. Described as pressure, centralized chest, worse on inspiration

## 2022-05-23 NOTE — ED Notes (Signed)
Pt not in the room

## 2022-05-26 ENCOUNTER — Ambulatory Visit: Payer: Medicaid Other | Admitting: Adult Health

## 2022-06-06 ENCOUNTER — Telehealth: Payer: Self-pay | Admitting: Gastroenterology

## 2022-06-06 NOTE — Telephone Encounter (Signed)
Good morning Dr. Silverio Decamp,   Supervising provider 06/06/22 AM  We received a call from this patient, stating she would like a second opinion. Patient is currently established at [facility]. She has been diagnosed with cirrhosis and recommended to have an EGD. Records are being sent up for your review. Please advise on scheduling.   Thank you.

## 2022-06-06 NOTE — Telephone Encounter (Signed)
Called and left detailed voicemail with Dr. Woodward Ku recommendation.

## 2022-06-06 NOTE — Telephone Encounter (Signed)
Request received to transfer GI care from outside practice to [place].  We appreciate the interest in our practice, however at this time due to high demand from patients without established GI providers we cannot accommodate this transfer.

## 2022-06-10 ENCOUNTER — Other Ambulatory Visit: Payer: Self-pay | Admitting: General Practice

## 2022-06-10 ENCOUNTER — Other Ambulatory Visit: Payer: Self-pay | Admitting: Adult Health

## 2022-06-10 DIAGNOSIS — R079 Chest pain, unspecified: Secondary | ICD-10-CM

## 2022-06-12 ENCOUNTER — Telehealth: Payer: Self-pay | Admitting: General Practice

## 2022-06-12 NOTE — Telephone Encounter (Signed)
Left message to call back. Received refill request for Corlanor from pharmacy, this was to take before the CT. Is she going to reschedule or  ? Will await call back

## 2022-06-12 NOTE — Telephone Encounter (Signed)
Have not heard from pt, will cancel order

## 2022-06-12 NOTE — Telephone Encounter (Signed)
Pt states she is returning someone's call

## 2022-06-12 NOTE — Telephone Encounter (Signed)
Patient stated she is returning RN's call.

## 2022-06-12 NOTE — Telephone Encounter (Signed)
Spoke with patient. Reviewed med for CCTA. She wasn't sure what she was initially called for.

## 2022-06-12 NOTE — Telephone Encounter (Signed)
Tried to call pt-Left message to call back.

## 2022-06-19 ENCOUNTER — Encounter: Payer: Self-pay | Admitting: Radiology

## 2022-06-27 NOTE — Telephone Encounter (Signed)
Likely allergy related. Would take zyrtec, mucinex and flonase nasal spray. We can send in prednisone '20mg'$  x 5 days for asthma symptoms. Needs visit if not better.

## 2022-06-27 NOTE — Telephone Encounter (Signed)
Received the following message from patient:   "I have some snot nose running coughing my lungs feel tight coughing up green stuff not much I'm not congested in my head or lungs but I think a zpack would cure it it always helps thanks"    I asked the patient if she had any increased SOB, fever or wheezing and stated no to all of the symptoms.   [name], can you please advise TP is not here today? Thanks!

## 2022-06-30 MED ORDER — AZITHROMYCIN 250 MG PO TABS: ORAL_TABLET | ORAL | 0 refills | Status: DC

## 2022-06-30 NOTE — Telephone Encounter (Signed)
Received the following message from patient:   "Now I have a headache and some green stuff coming out my nose I did a COVID test it was negative can I get a zpack please"  Dr. Silas Flood, can you please advise? Thanks!

## 2022-06-30 NOTE — Telephone Encounter (Signed)
Azithromycin 500 mg once a day on day 1, 250 mg once a day on days 2-5, quantity 6, refill 0.  Please schedule follow-up visit with [name] next available.

## 2022-07-06 ENCOUNTER — Other Ambulatory Visit: Payer: Self-pay | Admitting: Cardiovascular Disease

## 2022-07-09 NOTE — Telephone Encounter (Signed)
PT calling as well as MYCHART'ing Emily [name] to see what the nodules are on her recent CT scan.  [phone]

## 2022-07-10 NOTE — Telephone Encounter (Signed)
Emily [name]  P [facility] (supporting [name] S Parrett, NP)55 minutes ago (1:26 PM)    I'm going to get another CT scan one just of my chest my Dr ordered it I gotta be there at [employer] to [place] as an [place].

## 2022-07-10 NOTE — Telephone Encounter (Signed)
Okay sounds good . Keep follow up with PCP .

## 2022-07-10 NOTE — Telephone Encounter (Signed)
The CT that is in care everywhere is CT abd , only showed portion of lung.  Needs a CT chest w/out contrast to further evaluate in next couple of weeks and office visit to discuss a week after CT scan    In past CT chest have shown nodularity r/t to smoking and stable small nodules.

## 2022-07-10 NOTE — Telephone Encounter (Signed)
Mychart message sent by pt:  Emily [name]  [facility] (supporting [name] S Parrett, NP)20 hours ago (1:48 PM)    [place] ordered [place] scan because I'm staying sick well they found some new noules in my lungs I was wondering if you could look at the CT scan and let me know why they are there       [name], please advise.

## 2022-07-11 NOTE — Telephone Encounter (Signed)
Patient would like results of CT scan. Patient phone number is [phone].

## 2022-07-14 ENCOUNTER — Telehealth: Payer: Self-pay | Admitting: Adult Health

## 2022-07-14 DIAGNOSIS — R599 Enlarged lymph nodes, unspecified: Secondary | ICD-10-CM

## 2022-07-14 DIAGNOSIS — R918 Other nonspecific abnormal finding of lung field: Secondary | ICD-10-CM

## 2022-07-14 NOTE — Telephone Encounter (Signed)
PT calling again to see if we had read the CT scan that [name] ordered. Adv  we will contact her to let her know if she has seen it or not. [phone]

## 2022-07-14 NOTE — Telephone Encounter (Signed)
Pt. Calling to go over results of ct scan

## 2022-07-14 NOTE — Telephone Encounter (Signed)
Looked in PACS for patient's CT scan and could not find anything for her. I was able to find a copy of the report in [facility]. Below is a copy of the report.   Webb Silversmith, MD - 07/11/2022  Formatting of this note might be different from the original. CT CHEST WITHOUT CONTRAST  HISTORY: Abnormal abdominal CT scan  COMPARISON: CT abdomen and pelvis 07/09/2022  TECHNIQUE: Thin section axial CT images were obtained from the thoracic inlet through the lung bases and adrenal glands without contrast. Thin section coronal and sagittal images were reconstructed from the axial data set. Dose reduction was utilized (automated exposure control, mA and/or kV adjustment based on patient size, or iterative image reconstruction).  CONTRAST: None  FINDINGS:  Assessment of mediastinal and hilar structures is limited by the lack of IV contrast.  Nodes: Mildly enlarged precarinal space lymph node measuring 11 mm in diameter. Additional non-pathologically enlarged mediastinal nodes. Slightly prominent appearance of the hila. Hilar adenopathy is difficult to exclude. No axillary adenopathy. Vascular: No evidence of thoracic aortic aneurysm. Pleura: No pericardial or pleural effusions. Lungs: There are scattered bilateral pulmonary nodules measuring up to 8 mm in diameter. Some of these are ill-defined. There is faint hazy groundglass opacity associated in the upper lobes. Airways: Trachea and central bronchi are unremarkable. Heart: Heart is not enlarged. Faint coronary artery calcifications are noted.  Upper abdomen: Partially visualized hepatosplenomegaly, previously reported.  Musculoskeletal: No destructive osseous lesion or displaced fracture.   IMPRESSION:  1. Scattered bilateral pulmonary nodules measuring up to 8 mm in diameter. According to the 2017 Fleischner Society guidelines, multiple nodules measuring between 6 and 8 mm in diameter necessitate a 3-6 month follow-up in a patient  with no increased risk factors for lung cancer, with subsequent consideration of 18-24 month follow-up. In a high-risk patient, a 3-6 month follow-up CT scan is recommended, followed by a scan at 18-24 months if there has been no change. 2. Faint groundglass opacity associated with some the pulmonary nodules particularly in the upper lobes. Suggest possible infectious or inflammatory component. Clinical correlation suggested. 3. Mild mediastinal adenopathy which could be reactive or neoplastic.  Electronically Signed by: Webb Silversmith on 07/11/2022 4:53 PM   TP, can you please advise? Thanks!

## 2022-07-15 ENCOUNTER — Telehealth: Payer: Self-pay

## 2022-07-15 NOTE — Telephone Encounter (Signed)
Emily [name] pt messaged the office stating that she has been feeling under the weather for one week, symps include "snotty eyes watering coughing little mucus"... she reports mucus is green   Please advise

## 2022-07-15 NOTE — Telephone Encounter (Signed)
Patient is returning phone call. Patient phone number is [phone].

## 2022-07-15 NOTE — Telephone Encounter (Signed)
Called and spoke w/ pt she verbalized understanding. NFN att 

## 2022-07-15 NOTE — Telephone Encounter (Signed)
Looks like we did not order CT on this pt  She sent mychart msg asking for them on 07/11/22  Impression is below: CT CHEST WITHOUT CONTRAST  HISTORY: Abnormal abdominal CT scan  COMPARISON: CT abdomen and pelvis 07/09/2022  TECHNIQUE: Thin section axial CT images were obtained from the thoracic inlet through the lung bases and adrenal glands without contrast. Thin section coronal and sagittal images were reconstructed from the axial data set. Dose reduction was utilized (automated exposure control, mA and/or kV adjustment based on patient size, or iterative image reconstruction).  CONTRAST: None  FINDINGS:  Assessment of mediastinal and hilar structures is limited by the lack of IV contrast.  Nodes: Mildly enlarged precarinal space lymph node measuring 11 mm in diameter. Additional non-pathologically enlarged mediastinal nodes. Slightly prominent appearance of the hila. Hilar adenopathy is difficult to exclude. No axillary adenopathy. Vascular: No evidence of thoracic aortic aneurysm. Pleura: No pericardial or pleural effusions. Lungs: There are scattered bilateral pulmonary nodules measuring up to 8 mm in diameter. Some of these are ill-defined. There is faint hazy groundglass opacity associated in the upper lobes. Airways: Trachea and central bronchi are unremarkable. Heart: Heart is not enlarged. Faint coronary artery calcifications are noted.  Upper abdomen: Partially visualized hepatosplenomegaly, previously reported.  Musculoskeletal: No destructive osseous lesion or displaced fracture.   IMPRESSION:  1. Scattered bilateral pulmonary nodules measuring up to 8 mm in diameter. According to the 2017 Fleischner Society guidelines, multiple nodules measuring between 6 and 8 mm in diameter necessitate a 3-6 month follow-up in a patient with no increased risk factors for lung cancer, with subsequent consideration of 18-24 month follow-up. In a high-risk patient, a 3-6 month follow-up CT  scan is recommended, followed by a scan at 18-24 months if there has been no change. 2. Faint groundglass opacity associated with some the pulmonary nodules particularly in the upper lobes. Suggest possible infectious or inflammatory component. Clinical correlation suggested. 3. Mild mediastinal adenopathy which could be reactive or neoplastic.  Electronically Signed by: Webb Silversmith on 07/11/2022 4:53 PM    TP, can you please advise? Thanks!   [name], can you please advise any recs on this scan? She has called multiple times.   I tried calling her back and there was no answer- LMTCB

## 2022-07-15 NOTE — Telephone Encounter (Signed)
CT scan shows some scattered pulmonary nodules.  And some groundglass opacity in the upper lobes along with mild adenopathy.  This could be due to her recent illness.  This was ordered by another provider.  If patient would like to come and be evaluated if she has clinical symptoms we can definitely work her in in the next week or so.  Would recommend a follow-up CT chest without contrast in 3 months as she is an active smoker.  Please contact office for sooner follow up if symptoms do not improve or worsen or seek emergency care

## 2022-07-15 NOTE — Telephone Encounter (Signed)
Can begin Z-Pak No. 1 take as directed.  Mucinex twice daily as needed for cough and congestion. Claritin 10 mg daily as needed for drainage. Please contact office for sooner follow up if symptoms do not improve or worsen or seek emergency care

## 2022-07-15 NOTE — Telephone Encounter (Signed)
Patient checking on message sent for CT results. Patient phone number is [phone].

## 2022-07-15 NOTE — Telephone Encounter (Signed)
I answered this earlier, please see message did she call back

## 2022-07-15 NOTE — Telephone Encounter (Signed)
I called and spoke with the pt and notified her of results per TP  She verbalized understanding  She states she has had some increased cough since last seen  OV with TP for 07/22/22  Advised call sooner if needed or seek emergency care

## 2022-07-15 NOTE — Telephone Encounter (Signed)
Called pt again, still no answer so left another msg to call us back

## 2022-07-18 MED ORDER — AZITHROMYCIN 250 MG PO TABS: ORAL_TABLET | ORAL | 0 refills | Status: DC

## 2022-07-18 NOTE — Telephone Encounter (Signed)
Z-pak order placed according to telephone encounter on 07/15/22. Nothing further needed at this time.

## 2022-07-21 ENCOUNTER — Telehealth: Payer: Self-pay | Admitting: Adult Health

## 2022-07-21 NOTE — Telephone Encounter (Signed)
We can discuss it at [facility] tomorrow or she can be rescheduled to discuss with her Primary Pulmonolgist Dr. Chase Caller .

## 2022-07-21 NOTE — Telephone Encounter (Signed)
Spoke with patient. She is wanting to know if she can continue see [name] or does she need to she another provider for the pulmonary nodules. I advised pt I didn't see it mentioned anywhere, but I would ask anyway. [name] is the patient okay to stay with you or does she need to be scheduled for another provider?

## 2022-07-21 NOTE — Telephone Encounter (Signed)
Pt wants Nurse Parrett to reach out to get to see if she should see another specialist for her nodes or should she stay here

## 2022-07-21 NOTE — Telephone Encounter (Signed)
Spoke with patient she states she will keep appointment with [name] tomorrow. She states she has been super kind to me and I have enjoyed seeing her.   Closing encounter NFN

## 2022-07-22 ENCOUNTER — Ambulatory Visit (INDEPENDENT_AMBULATORY_CARE_PROVIDER_SITE_OTHER): Payer: Medicaid Other | Admitting: Adult Health

## 2022-07-22 ENCOUNTER — Other Ambulatory Visit (HOSPITAL_COMMUNITY): Payer: Self-pay

## 2022-07-22 ENCOUNTER — Telehealth: Payer: Self-pay | Admitting: Internal Medicine

## 2022-07-22 ENCOUNTER — Encounter: Payer: Self-pay | Admitting: Adult Health

## 2022-07-22 ENCOUNTER — Telehealth: Payer: Self-pay

## 2022-07-22 ENCOUNTER — Encounter: Payer: Self-pay | Admitting: Pulmonary Disease

## 2022-07-22 VITALS — BP 120/80 | HR 61 | Temp 98.2°F | Ht 63.0 in | Wt 195.8 lb

## 2022-07-22 DIAGNOSIS — F17200 Nicotine dependence, unspecified, uncomplicated: Secondary | ICD-10-CM | POA: Diagnosis not present

## 2022-07-22 DIAGNOSIS — J42 Unspecified chronic bronchitis: Secondary | ICD-10-CM | POA: Diagnosis not present

## 2022-07-22 DIAGNOSIS — R918 Other nonspecific abnormal finding of lung field: Secondary | ICD-10-CM

## 2022-07-22 DIAGNOSIS — K029 Dental caries, unspecified: Secondary | ICD-10-CM

## 2022-07-22 MED ORDER — FLUTICASONE-SALMETEROL 250-50 MCG/ACT IN AEPB: 1.0000 | INHALATION_SPRAY | Freq: Two times a day (BID) | RESPIRATORY_TRACT | 5 refills | Status: DC

## 2022-07-22 NOTE — Telephone Encounter (Signed)
Had a CT done with another provider, wants Dr. Chase Caller to look at it

## 2022-07-22 NOTE — Patient Instructions (Addendum)
Finish Zithromax as planned.  Begin Advair 250 1 puff Twice daily, rinse after use.  Continue on Duoneb Three times a day  .  Activity as tolerated.  Mucinex DM Twice daily  As needed  Cough/cogestion .  Tessalon Three times a day  As needed  cough .  Work on not smoking .  Follow up for dentist as discussed.  CT chest in 3 month as planned  Follow up with Dr. Chase Caller with PFT in 3 months and As needed   Please contact office for sooner follow up if symptoms do not improve or worsen or seek emergency care

## 2022-07-22 NOTE — Assessment & Plan Note (Signed)
Acute exacerbation of chronic bronchitis and a heavy smoker.  Suspect she has a component of COPD.  We have requested PFTs on multiple occasions.  Have encouraged her to return for pulmonary function testing.  High-resolution CT chest in 2022 showed no interstitial lung disease however apical nodularity concerning for smoking-related bronchiolitis.  Most recent CT chest does show some scattered small pulmonary nodules largest measuring 8 mm.  Patient is high risk we will repeat CT chest in 3 months. Would like for patient to begin Advair.  Can continue on DuoNeb 3 times daily.  Smoking cessation is encouraged  Plan ' Patient Instructions  Finish Zithromax as planned.  Begin Advair 250 1 puff Twice daily, rinse after use.  Continue on Duoneb Three times a day  .  Activity as tolerated.  Mucinex DM Twice daily  As needed  Cough/cogestion .  Tessalon Three times a day  As needed  cough .  Work on not smoking .  Follow up for dentist as discussed.  CT chest in 3 month as planned  Follow up with Dr. Chase Caller with PFT in 3 months and As needed   Please contact office for sooner follow up if symptoms do not improve or worsen or seek emergency care

## 2022-07-22 NOTE — Assessment & Plan Note (Signed)
Scattered pulmonary nodules largest measuring up to 8 mm.  Needs a follow-up CT chest in 3 months.  Smoking cessation discussed in detail

## 2022-07-22 NOTE — Progress Notes (Signed)
@Patient  ID: Emily [name], female    DOB: 18-May-1980, 42 y.o.   MRN: [id number]  Chief Complaint  Patient presents with   Follow-up    Referring provider: Cannon Kettle  HPI: 42 yo female active smoker-Heavy smoking history followed for Chronic bronchitis and rhinitis , abnormal CT chest  Medical history significant for DM, Bipolar disorder, and panic disorder  TEST/EVENTS :  Labs: 2020 :  Ace - 81 Esr - 30 Rh<14 CCP<16 ANCA - negative  Hypersensitive activity pneumonitis panel negative Anti-Jo 1 less than 0.2 RNP antibodies greater than 8 Sjogren's syndrome antibodies negative Anti-scleroderma antibody less than 1 Aldolase 4.5 MPO/PR-3 (ANCA) antibodies-less than 1 Total CK 47, CK-MB less than 7 -negative     04/01/2018-CT chest high resolution- few patchy areas of mild groundglass attenuation in the lungs bilaterally nonspecific, stable pulmonary nodules from 2009    feNO - 01/26/2018 - 5 ppbd and is normal   01/08/2015- spirometry- FVC 1.33 (38% predicted), ratio 72, FEV1 1.01 (37% predicted)   High-resolution CT chest July 25, 2020 bandlike scarring left base, no evidence of interstitial lung disease, mild air trapping, fine centrilobular nodularity concentrated in the lung apices consistent with smoking-related bronchiolitis. Neg for ILD .   07/22/2022 Follow up: Chronic bronchitis, Abnormal Chest Patient presents for a 28-month follow-up.  Patient has a very heavy smoking history.  She is treated for chronic bronchitis with previous spirometry showing severe restrictive lung disease.  Patient has been recommended for inhalers in the past with Judithann Sauger and Incruse unfortunately patient says that she did not get these for multiple reasons.  She does take DuoNeb 3 times daily.  She continues to smoke.  We discussed smoking cessation.  Previous CT chest chest showed possible smoking-related bronchiolitis.  With fine nodularity in the lung apices.  Previous  high-resolution CT chest in April 2022 showed stable scarring with no evidence of interstitial lung disease. Patient recently had acute flare of bronchitis.  With increased cough and congestion.  She was given a Z-Pak.  She is on day 4 of 5.  She says she is feeling much better.  Cough and congestion have almost totally resolved.  Patient has been recommended to follow-up with dentistry multiple times and she has multiple dental caries and broken teeth.  She is prone to recurrent bronchitis. She also has been recommended to return for pulmonary function testing on multiple occasions but unfortunately has canceled all the follow-ups.  We discussed again how important it is for [place] to follow-up on her lung function.  Patient recently had some GI issues and had a CT abdomen pelvis that showed some lung nodularity.  She had a dedicated CT chest completed on July 10, 2022.  Report is in care everywhere that showed scattered bilateral pulmonary nodules measuring up to 8 mm and faint groundglass opacities and nodularity in the upper lobes.  Mild mediastinal adenopathy.  Patient has been recommended for a 22-month CT follow-up.  She denies any hemoptysis or unintentional weight loss.  Previous CT scans have shown coronary atherosclerosis.  She has been referred to cardiology and is following.  She recently had a coronary CT chest in November 2023 that was nondiagnostic due to movement.  Patient has been recommend to follow up for repeat scan.     Allergies  Allergen Reactions   Bactrim [Sulfamethoxazole-Trimethoprim] Anaphylaxis and Other (See Comments)    Chest pain, trouble breathing   Ciprofloxacin Palpitations   Clindamycin/Lincomycin  Other reaction(s): Chest Pain   Flagyl [Metronidazole] Shortness Of Breath   Iodinated Contrast Media Swelling   Ondansetron Shortness Of Breath   Liraglutide Nausea And Vomiting   Doxycycline Nausea And Vomiting   Penicillins    Prednisone Other (See Comments)     Makes her angry and irritable    Immunization History  Administered Date(s) Administered   Influenza Split 03/22/2014   Influenza Whole 01/13/2008   Influenza, Seasonal, Injecte, Preservative Fre 04/18/2015   Influenza,inj,Quad PF,6+ Mos 04/18/2015, 01/08/2022   PFIZER(Purple Top)SARS-COV-2 Vaccination 03/12/2020, 04/16/2020   Pneumococcal Polysaccharide-23 05/10/2012   Tdap 01/02/2006, 08/08/2015    Past Medical History:  Diagnosis Date   Abnormal CT of liver    Anxiety    Asthma    Asthma    Bipolar 1 disorder    Bipolar disorder    Chronic pain    Cirrhosis    Depression    Fatty liver    GERD (gastroesophageal reflux disease)    Headache(784.0)    Hyperlipidemia    Hypertension    Pulmonary nodule    Tachycardia    Thyroid disease     Tobacco History: Social History   Tobacco Use  Smoking Status Every Day   Packs/day: 1.50   Years: 20.00   Additional pack years: 0.00   Total pack years: 30.00   Types: Cigarettes  Smokeless Tobacco Never  Tobacco Comments   Smoking a pack per day.  03/11/2022 hfb   Ready to quit: No Counseling given: Yes Tobacco comments: Smoking a pack per day.  03/11/2022 hfb   Outpatient Medications Prior to Visit  Medication Sig Dispense Refill   ACCU-CHEK AVIVA PLUS test strip 1 each by Other route 3 (three) times daily.     ACCU-CHEK FASTCLIX LANCETS MISC USE AS DIRECTED 102 each 3   albuterol (VENTOLIN HFA) 108 (90 Base) MCG/ACT inhaler Inhale 2 puffs into the lungs every 6 (six) hours as needed for wheezing or shortness of breath. 18 g 1   amitriptyline (ELAVIL) 25 MG tablet Take 50 mg by mouth at bedtime.     amLODipine (NORVASC) 5 MG tablet Take 1 tablet (5 mg total) by mouth daily. 90 tablet 3   atorvastatin (LIPITOR) 40 MG tablet Take 40 mg by mouth daily.     azithromycin (ZITHROMAX Z-PAK) 250 MG tablet Take 2 tabs today, then 1 tab until gone 6 each 0   benzonatate (TESSALON) 200 MG capsule Take 1 capsule (200 mg total)  by mouth 3 (three) times daily as needed. 45 capsule 1   benztropine (COGENTIN) 1 MG tablet Take 1 mg by mouth 2 (two) times daily.     Blood Glucose Monitoring Suppl (ACCU-CHEK AVIVA PLUS) w/Device KIT USE AS DIRECTED 1 kit 0   clonazePAM (KLONOPIN) 0.5 MG disintegrating tablet Take 0.5 mg by mouth daily as needed.     HUMALOG KWIKPEN 100 UNIT/ML KwikPen SUB-Q 3 Times Daily     Insulin Pen Needle 32G X 4 MM MISC 1 each by Does not apply route as needed.     ipratropium-albuterol (DUONEB) 0.5-2.5 (3) MG/3ML SOLN Take 3 mLs by nebulization in the morning, at noon, and at bedtime. 270 mL 11   ivabradine (CORLANOR) 5 MG TABS tablet Take tablets (15mg ) TWO hours prior to your cardiac CT scan. 3 tablet 0   lamoTRIgine (LAMICTAL) 150 MG tablet Take 150 mg by mouth daily.     levothyroxine (SYNTHROID, LEVOTHROID) 75 MCG tablet Take 1 tablet (75 mcg  total) by mouth daily before breakfast. 90 tablet 3   losartan (COZAAR) 50 MG tablet Take 1 tablet (50 mg total) by mouth daily. 30 tablet 0   montelukast (SINGULAIR) 10 MG tablet Take 1 tablet (10 mg total) by mouth at bedtime. 90 tablet 3   Nebulizers (COMPRESSOR/NEBULIZER) MISC Use as directed 1 each 0   nitroGLYCERIN (NITROSTAT) 0.4 MG SL tablet Place 1 tablet (0.4 mg total) under the tongue every 5 (five) minutes as needed for chest pain. 25 tablet 3   omeprazole (PRILOSEC) 40 MG capsule Take 40 mg by mouth every morning.     paroxetine mesylate (PEXEVA) 20 MG tablet Take 20 mg by mouth daily.     PAXIL 20 MG tablet Take 20 mg by mouth daily.     promethazine (PHENERGAN) 12.5 MG tablet Take 12.5 mg by mouth every 8 (eight) hours as needed.     propranolol (INDERAL) 20 MG tablet TAKE 1 TABLET BY MOUTH THREE TIMES A DAY 270 tablet 2   Respiratory Therapy Supplies (NEBULIZER/TUBING/MOUTHPIECE) KIT Use as directed 10 kit 11   traZODone (DESYREL) 50 MG tablet Take 50-100 mg by mouth at bedtime.     Vitamin D, Ergocalciferol, (DRISDOL) 1.25 MG (50000 UNIT)  CAPS capsule Take 50,000 Units by mouth once a week.     ziprasidone (GEODON) 60 MG capsule Take 60 mg by mouth daily.     fluticasone-salmeterol (ADVAIR) 250-50 MCG/ACT AEPB Inhale 1 puff into the lungs every 12 (twelve) hours. Rinse after use. 60 each 5   umeclidinium bromide (INCRUSE ELLIPTA) 62.5 MCG/ACT AEPB Inhale 2 puffs into the lungs daily. Rinse after use. 60 each 5   diphenhydrAMINE (BENADRYL) 50 MG tablet Take 1 tablet (50 mg total) by mouth once for 1 dose. Take one hour prior to test. 1 tablet 0   Facility-Administered Medications Prior to Visit  Medication Dose Route Frequency Provider Last Rate Last Admin   clindamycin (CLEOCIN) 2 % vaginal cream 1 Applicatorful  1 Applicatorful Vaginal QHS Jonnie Kind, MD         Review of Systems:   Constitutional:   No  weight loss, night sweats,  Fevers, chills, + fatigue, or  lassitude.  HEENT:   No headaches,  Difficulty swallowing,  Tooth/dental problems, or  Sore throat,                No sneezing, itching, ear ache, nasal congestion, post nasal drip,   CV:  No chest pain,  Orthopnea, PND, swelling in lower extremities, anasarca, dizziness, palpitations, syncope.   GI  No heartburn, indigestion, abdominal pain, nausea, vomiting, diarrhea, change in bowel habits, loss of appetite, bloody stools.   Resp: .  No chest wall deformity  Skin: no rash or lesions.  GU: no dysuria, change in color of urine, no urgency or frequency.  No flank pain, no hematuria   MS:  No joint pain or swelling.  No decreased range of motion.  No back pain.    Physical Exam  BP 120/80 (BP Location: Left Arm, Patient Position: Sitting, Cuff Size: Large)   Pulse 61   Temp 98.2 F (36.8 C) (Oral)   Ht 5\' 3"  (1.6 m)   Wt 195 lb 12.8 oz (88.8 kg)   SpO2 96%   BMI 34.68 kg/m   GEN: A/Ox3; pleasant , NAD, well nourished    HEENT:  [place]/AT,  NOSE-clear, THROAT-clear, no lesions, no postnasal drip or exudate noted.  Poor dentition  NECK:  Supple w/ fair ROM; no JVD; normal carotid impulses w/o bruits; no thyromegaly or nodules palpated; no lymphadenopathy.    RESP  Clear  P & A; w/o, wheezes/ rales/ or rhonchi. no accessory muscle use, no dullness to percussion  CARD:  RRR, no m/r/g, no peripheral edema, pulses intact, no cyanosis or clubbing.  GI:   Soft & nt; nml bowel sounds; no organomegaly or masses detected.   Musco: Warm bil, no deformities or joint swelling noted.   Neuro: alert, no focal deficits noted.    Skin: Warm, no lesions or rashes    Lab Results:  CBC   ProBNP No results found for: "PROBNP"  Imaging: No results found.        No data to display          Lab Results  Component Value Date   NITRICOXIDE 5 01/26/2018        Assessment & Plan:   Chronic bronchitis ([place]) Acute exacerbation of chronic bronchitis and a heavy smoker.  Suspect she has a component of COPD.  We have requested PFTs on multiple occasions.  Have encouraged her to return for pulmonary function testing.  High-resolution CT chest in 2022 showed no interstitial lung disease however apical nodularity concerning for smoking-related bronchiolitis.  Most recent CT chest does show some scattered small pulmonary nodules largest measuring 8 mm.  Patient is high risk we will repeat CT chest in 3 months. Would like for patient to begin Advair.  Can continue on DuoNeb 3 times daily.  Smoking cessation is encouraged  Plan ' Patient Instructions  Finish Zithromax as planned.  Begin Advair 250 1 puff Twice daily, rinse after use.  Continue on Duoneb Three times a day  .  Activity as tolerated.  Mucinex DM Twice daily  As needed  Cough/cogestion .  Tessalon Three times a day  As needed  cough .  Work on not smoking .  Follow up for dentist as discussed.  CT chest in 3 month as planned  Follow up with Dr. Chase Caller with PFT in 3 months and As needed   Please contact office for sooner follow up if symptoms do not improve or  worsen or seek emergency care      Pulmonary nodules Scattered pulmonary nodules largest measuring up to 8 mm.  Needs a follow-up CT chest in 3 months.  Smoking cessation discussed in detail  Dental caries Patient has significant dental caries.  Have encouraged her to follow-up with dentistry as planned  TOBACCO ABUSE Smoking cessation discussed in detail    I spent   40 minutes dedicated to the care of this patient on the date of this encounter to include pre-visit review of records, face-to-face time with the patient discussing conditions above, post visit ordering of testing, clinical documentation with the electronic health record, making appropriate referrals as documented, and communicating necessary findings to members of the patients care team.   Rexene Edison, NP 07/22/2022

## 2022-07-22 NOTE — Assessment & Plan Note (Signed)
Patient has significant dental caries.  Have encouraged her to follow-up with dentistry as planned

## 2022-07-22 NOTE — Assessment & Plan Note (Signed)
Smoking cessation discussed in detail 

## 2022-07-22 NOTE — Progress Notes (Signed)
Patient seen in the office today and instructed on use of Advair diskus.  Patient expressed understanding and demonstrated technique.

## 2022-07-22 NOTE — Telephone Encounter (Signed)
PA request received via CMM for Wixela Inhub 250-50MCG/ACT aerosol powder  PA not submitted due to Brand Advair Diskus being covered under patients Medicaid coverage.

## 2022-07-23 NOTE — Telephone Encounter (Signed)
Dr. Chase Caller can you please advise on recent CT results. See my chart message below from patient  Is there any way you could look at my CT scan results and tell me what you think I seen [name] today the PA I think something should be done I have swollen lymph nodes in my lungs and a bun of nodules and glass opacity

## 2022-07-24 NOTE — Telephone Encounter (Signed)
      Most recent coronray CT Nov 2023    Mediastinum/Nodes: No enlarged mediastinal or axillary lymph nodes. Thyroid gland, trachea, and esophagus demonstrate no significant findings. -> NORMAL MEDIASTINU   Lungs/Pleura: Lungs are clear. No pleural effusion or pneumothorax. Solid pulmonary nodule of the right middle lobe measuring 4 mm on series 11, image 25, stable when compared with prior exam, further follow-up imaging is needed given greater than 1 year stability. - > NORMAL LUNGS. 93mm nodule stable. No futer followup

## 2022-07-24 NOTE — Telephone Encounter (Signed)
She is Dr. Golden Pop patient. Send the message to him. Thanks

## 2022-07-24 NOTE — Telephone Encounter (Signed)
Pt called the office again wanting to have MR review her recent CT. Please advise.

## 2022-07-25 NOTE — Telephone Encounter (Signed)
Pt returning a missed call

## 2022-07-25 NOTE — Telephone Encounter (Signed)
She should get a CT chest without contrar in out sysetm in 6 months from March 2024 and follow here

## 2022-07-25 NOTE — Telephone Encounter (Signed)
MR- the ct that she was asking about was done at [facility] 07/10/22  Impression:  Impression  IMPRESSION:  1.  Scattered bilateral pulmonary nodules measuring up to 8 mm in diameter. According to the 2017 Fleischner Society guidelines, multiple nodules measuring between 6 and 8 mm in diameter necessitate a 3-6 month follow-up in a patient with no increased risk factors for lung cancer, with subsequent consideration of 18-24 month follow-up.  In a high-risk patient, a 3-6 month follow-up CT scan is recommended, followed by a scan at 18-24 months if there has been no change.   2. Faint groundglass opacity associated with some the pulmonary nodules particularly in the upper lobes. Suggest possible infectious or inflammatory component. Clinical correlation suggested. 3. Mild mediastinal adenopathy which could be reactive or neoplastic.  Electronically Signed by: Manon Hilding on 07/11/2022 4:53 PM

## 2022-07-25 NOTE — Telephone Encounter (Signed)
PT ret missed call. Please try again.  She would like Dr. Elvera Lennox to look at them before we call her back, if possible.

## 2022-07-25 NOTE — Telephone Encounter (Signed)
Called patient but she did not answer. Left message for her to call back.  

## 2022-07-28 ENCOUNTER — Ambulatory Visit: Payer: Medicaid Other | Admitting: Adult Health

## 2022-07-28 NOTE — Telephone Encounter (Signed)
Patient checking on message for CT scan. Patient phone number is [phone].

## 2022-07-28 NOTE — Telephone Encounter (Signed)
Emily Crutch, MD      07/25/22  4:41 PM Note She should get a CT chest without contrar in out sysetm in 6 months from March 2024 and follow here       I spoke with the pt and notified of response stated above per MR. She verbalized understanding. I have placed order for future ct.

## 2022-07-30 ENCOUNTER — Ambulatory Visit (INDEPENDENT_AMBULATORY_CARE_PROVIDER_SITE_OTHER): Payer: Medicaid Other | Admitting: Adult Health

## 2022-07-30 ENCOUNTER — Encounter: Payer: Self-pay | Admitting: Adult Health

## 2022-07-30 ENCOUNTER — Other Ambulatory Visit (HOSPITAL_COMMUNITY)
Admission: RE | Admit: 2022-07-30 | Discharge: 2022-07-30 | Disposition: A | Discharge status: Home / Self Care | Payer: Medicaid Other | Source: Ambulatory Visit | Attending: Adult Health | Admitting: Adult Health

## 2022-07-30 VITALS — BP 131/89 | HR 88 | Ht 63.0 in | Wt 196.0 lb

## 2022-07-30 DIAGNOSIS — Z3202 Encounter for pregnancy test, result negative: Secondary | ICD-10-CM | POA: Insufficient documentation

## 2022-07-30 DIAGNOSIS — Z124 Encounter for screening for malignant neoplasm of cervix: Secondary | ICD-10-CM | POA: Diagnosis present

## 2022-07-30 DIAGNOSIS — N920 Excessive and frequent menstruation with regular cycle: Secondary | ICD-10-CM

## 2022-07-30 DIAGNOSIS — N888 Other specified noninflammatory disorders of cervix uteri: Secondary | ICD-10-CM

## 2022-07-30 DIAGNOSIS — N911 Secondary amenorrhea: Secondary | ICD-10-CM

## 2022-07-30 DIAGNOSIS — R232 Flushing: Secondary | ICD-10-CM | POA: Insufficient documentation

## 2022-07-30 DIAGNOSIS — R4589 Other symptoms and signs involving emotional state: Secondary | ICD-10-CM

## 2022-07-30 LAB — POCT URINE PREGNANCY: Preg Test, Ur: NEGATIVE

## 2022-07-30 MED ORDER — CLINDAMYCIN PHOSPHATE 2 % VA CREA: 1.0000 | TOPICAL_CREAM | Freq: Every day | VAGINAL | 0 refills | Status: DC

## 2022-07-30 NOTE — Progress Notes (Signed)
  Subjective:     Patient ID: Emily [name], female   DOB: [birth year], 42 y.o.   MRN: [id number]  HPI Emily [name] is a 42 year old white female, single, G1P0010, in complaining of spotting, and cramps, no true period on over a year. Has some hot flashes and moody. ?knot in vagina.  PCP is Hinda Glatter PA  Review of Systems Spotting, cramps No period in a year [place] +hot flashes +knot in vagina  Reviewed past medical,surgical, social and family history. Reviewed medications and allergies.     Objective:   Physical Exam BP 131/89 (BP Location: Left Arm, Patient Position: Sitting, Cuff Size: Normal)   Pulse 88   Ht 5\' 3"  (1.6 m)   Wt 196 lb (88.9 kg)   BMI 34.72 kg/m  UPT is negative Skin warm and dry.Pelvic: external genitalia is normal in appearance no lesions, vagina: pale pink,urethra has no lesions or masses noted, cervix:smooth, pap with HR HPV genotyping performed, cervix bleed, uterus: normal size, shape and contour, non tender, no masses felt, adnexa: no masses or tenderness noted. Bladder is non tender and no masses felt.  Fall risk is low  [facility] - 07/30/22 1119       Pregnancy Intention Screening   Does the patient want to become pregnant in the next year? No    Does the patient's partner want to become pregnant in the next year? No    Would the patient like to discuss contraceptive options today? No      Contraception Wrap Up   Current Method Abstinence    End Method Abstinence            Examination chaperoned by Malachy Mood LPN    Assessment:     1. Pregnancy examination or test, negative result  - POCT urine pregnancy  2. Routine Papanicolaou smear Pap sent  - Cytology - PAP( [place])  3. Spotting Will get pelvic US to assess uterus  and ovaries  - US PELVIC COMPLETE WITH TRANSVAGINAL; Future  4. Friable cervix Cervix bleed when pap performed   5. Amenorrhea, secondary No true period in over a year but spots  Will check FSH to assess  if postmenopausal Scheduled pelvic US at [facility] 08/07/22 at 12:30 pm to assess uterine lining  - Follicle stimulating hormone - US PELVIC COMPLETE WITH TRANSVAGINAL; Future  6. Hot flashes +hot flashes at times - Follicle stimulating hormone  7. [place] - Follicle stimulating hormone     Plan:     Follow up TBD after labs and [place] results back

## 2022-07-31 ENCOUNTER — Telehealth: Payer: Self-pay

## 2022-07-31 LAB — FOLLICLE STIMULATING HORMONE: FSH: 10.3 m[IU]/mL

## 2022-07-31 NOTE — Telephone Encounter (Signed)
Patient would like for someone to call her. Please call on the home phone.

## 2022-08-01 ENCOUNTER — Telehealth: Payer: Self-pay | Admitting: Adult Health

## 2022-08-01 NOTE — Telephone Encounter (Signed)
Pt aware. FSH is 10.3 so not postmenopause and pap is not back yet. Get [place] 08/07/22.

## 2022-08-01 NOTE — Telephone Encounter (Signed)
Patient called. We changed home phone number in chart. You should be able to get in touch with her now.

## 2022-08-07 ENCOUNTER — Ambulatory Visit (HOSPITAL_COMMUNITY)
Admission: RE | Admit: 2022-08-07 | Discharge: 2022-08-07 | Disposition: A | Discharge status: Home / Self Care | Payer: Medicaid Other | Source: Ambulatory Visit | Attending: Adult Health | Admitting: Adult Health

## 2022-08-07 DIAGNOSIS — N911 Secondary amenorrhea: Secondary | ICD-10-CM

## 2022-08-07 DIAGNOSIS — N83291 Other ovarian cyst, right side: Secondary | ICD-10-CM | POA: Diagnosis not present

## 2022-08-07 DIAGNOSIS — N920 Excessive and frequent menstruation with regular cycle: Secondary | ICD-10-CM

## 2022-08-11 ENCOUNTER — Telehealth: Payer: Self-pay | Admitting: *Deleted

## 2022-08-11 ENCOUNTER — Encounter: Payer: Self-pay | Admitting: Adult Health

## 2022-08-11 DIAGNOSIS — N83202 Unspecified ovarian cyst, left side: Secondary | ICD-10-CM | POA: Insufficient documentation

## 2022-08-11 DIAGNOSIS — R87610 Atypical squamous cells of undetermined significance on cytologic smear of cervix (ASC-US): Secondary | ICD-10-CM | POA: Insufficient documentation

## 2022-08-11 LAB — CYTOLOGY - PAP
Comment: NEGATIVE
Comment: NEGATIVE
Comment: NEGATIVE
Diagnosis: UNDETERMINED — AB
HPV 16: NEGATIVE
HPV 18 / 45: NEGATIVE
High risk HPV: POSITIVE — AB

## 2022-08-11 NOTE — Telephone Encounter (Signed)
Left message @ 11:58 am with Cytology asking about pap results. She will check into it and call me back. JSY

## 2022-08-13 ENCOUNTER — Encounter: Payer: Self-pay | Admitting: Obstetrics and Gynecology

## 2022-08-13 ENCOUNTER — Other Ambulatory Visit (HOSPITAL_COMMUNITY)
Admission: RE | Admit: 2022-08-13 | Discharge: 2022-08-13 | Disposition: A | Discharge status: Home / Self Care | Payer: Medicaid Other | Source: Ambulatory Visit | Attending: Obstetrics and Gynecology | Admitting: Obstetrics and Gynecology

## 2022-08-13 ENCOUNTER — Ambulatory Visit (INDEPENDENT_AMBULATORY_CARE_PROVIDER_SITE_OTHER): Payer: Medicaid Other | Admitting: Obstetrics and Gynecology

## 2022-08-13 VITALS — BP 139/83 | HR 79 | Ht 63.0 in | Wt 193.8 lb

## 2022-08-13 DIAGNOSIS — R87810 Cervical high risk human papillomavirus (HPV) DNA test positive: Secondary | ICD-10-CM

## 2022-08-13 DIAGNOSIS — R87610 Atypical squamous cells of undetermined significance on cytologic smear of cervix (ASC-US): Secondary | ICD-10-CM | POA: Insufficient documentation

## 2022-08-13 DIAGNOSIS — Z3202 Encounter for pregnancy test, result negative: Secondary | ICD-10-CM

## 2022-08-13 LAB — POCT URINE PREGNANCY: Preg Test, Ur: NEGATIVE

## 2022-08-13 NOTE — Progress Notes (Signed)
    GYNECOLOGY CLINIC COLPOSCOPY PROCEDURE NOTE  42 y.o. G1P0010 here for colposcopy for ASCUS with POSITIVE high risk HPV pap smear on 4/24. Discussed role for HPV in cervical dysplasia, need for surveillance.  Patient given informed consent, signed copy in the chart, time out was performed.  Placed in lithotomy position. Cervix viewed with speculum and colposcope after application of acetic acid.   Colposcopy adequate? Yes  acetowhite lesion(s) noted at 6 & 8 o'clock; corresponding biopsies obtained.  ECC specimen obtained. Monsel's applied All specimens were labelled and sent to pathology.  Patient was given post procedure instructions.  Will follow up pathology and manage accordingly.  Routine preventative health maintenance measures emphasized.    Nettie Elm, MD, FACOG Attending Obstetrician & Gynecologist Center for [facility], [facility]

## 2022-08-15 LAB — SURGICAL PATHOLOGY

## 2022-08-21 ENCOUNTER — Other Ambulatory Visit: Payer: Self-pay | Admitting: Adult Health

## 2022-08-22 ENCOUNTER — Other Ambulatory Visit: Payer: Self-pay | Admitting: Adult Health

## 2022-08-22 MED ORDER — AZITHROMYCIN 250 MG PO TABS: ORAL_TABLET | ORAL | 0 refills | Status: DC

## 2022-08-22 NOTE — Progress Notes (Signed)
Rx azithromycin 

## 2022-08-28 HISTORY — PX: UPPER GI ENDOSCOPY: SHX6162

## 2022-08-28 NOTE — Telephone Encounter (Signed)
Dr. Marchelle Gearing, please see mychart messages sent and advise on what you recommend.

## 2022-08-29 ENCOUNTER — Telehealth: Payer: Self-pay | Admitting: *Deleted

## 2022-08-29 NOTE — Telephone Encounter (Signed)
Please have her come in to see an  APP   Thanks    SIGNATURE    Dr. Kalman Shan, M.D., F.C.C.P,  Pulmonary and Critical Care Medicine Staff Physician, [facility] Center Director - Interstitial Lung Disease  Program  Medical Director - [facility] Pulmonary Fibrosis [facility] at [facility] [place], [place], [address]   Pager: [phone], If no answer  -> Check AMION or Try [phone] Telephone (clinical office): [phone] Telephone (research): [phone]  8:17 AM 08/29/2022

## 2022-08-29 NOTE — Telephone Encounter (Signed)
ATC patient x1 to schedule her an appointment with one of the NP for today or next week.  Left a detailed vm per DPR.  Will await return call.

## 2022-09-04 ENCOUNTER — Encounter: Payer: Self-pay | Admitting: Adult Health

## 2022-09-04 ENCOUNTER — Ambulatory Visit (INDEPENDENT_AMBULATORY_CARE_PROVIDER_SITE_OTHER): Payer: Medicaid Other | Admitting: Adult Health

## 2022-09-04 VITALS — BP 120/81 | HR 76 | Ht 63.0 in | Wt 195.5 lb

## 2022-09-04 DIAGNOSIS — N920 Excessive and frequent menstruation with regular cycle: Secondary | ICD-10-CM | POA: Diagnosis not present

## 2022-09-04 DIAGNOSIS — N898 Other specified noninflammatory disorders of vagina: Secondary | ICD-10-CM

## 2022-09-04 MED ORDER — METRONIDAZOLE 0.75 % VA GEL: 1.0000 | Freq: Every day | VAGINAL | 1 refills | Status: DC

## 2022-09-04 NOTE — Progress Notes (Signed)
  Subjective:     Patient ID: Emily [name], female   DOB: Aug 23, 1980, 42 y.o.   MRN: [id number]  HPI Emily [name] is a 42 year old white female,single, G0P0 in complaining of vaginal discharge and spotting, she had colpo with biopsies 08/13/22. Colpo CIN 1    Component Value Date/Time   DIAGPAP (A) 07/30/2022 1120    - Atypical squamous cells of undetermined significance (ASC-US)   HPVHIGH Positive (A) 07/30/2022 1120   ADEQPAP  07/30/2022 1120    Satisfactory for evaluation; transformation zone component PRESENT.   PCP is K Hess PA  Review of Systems Vaginal discharge and spotting Not having sex Reviewed past medical,surgical, social and family history. Reviewed medications and allergies.     Objective:   Physical Exam BP 120/81 (BP Location: Left Arm, Patient Position: Sitting, Cuff Size: Normal)   Pulse 76   Ht 5\' 3"  (1.6 m)   Wt 195 lb 8 oz (88.7 kg)   BMI 34.63 kg/m     Skin warm and dry.Pelvic: external genitalia is normal in appearance no lesions, vagina: pink discharge with odor,urethra has no lesions or masses noted, cervix:irritated and has areas of blood,  uterus: normal size, shape and contour, non tender, no masses felt, adnexa: no masses or tenderness noted. Bladder is non tender and no masses felt. Fall risk is low  Upstream - 09/04/22 1619       Pregnancy Intention Screening   Does the patient want to become pregnant in the next year? No    Does the patient's partner want to become pregnant in the next year? No    Would the patient like to discuss contraceptive options today? No      Contraception Wrap Up   Current Method Abstinence    End Method Abstinence    Contraception Counseling Provided No            Examination chaperoned by Malachy Mood LPN  Assessment:     1. Vaginal discharge Has vaginal discharge Had colpo with biopsies 08/13/22 Will rx metrogel  Meds ordered this encounter  Medications   metroNIDAZOLE (METROGEL) 0.75 % vaginal gel     Sig: Place 1 Applicatorful vaginally at bedtime.    Dispense:  70 g    Refill:  1    Order Specific Question:   Supervising Provider    Answer:   Despina Hidden, LUTHER H [[id number]]     2. Spotting  3. Vaginal odor +odor  Will rx metrogel      Plan:     Follow up in 2 weeks for recheck

## 2022-09-08 ENCOUNTER — Telehealth: Payer: Self-pay | Admitting: Adult Health

## 2022-09-08 ENCOUNTER — Ambulatory Visit: Payer: Medicaid Other | Admitting: Obstetrics & Gynecology

## 2022-09-08 ENCOUNTER — Encounter: Payer: Self-pay | Admitting: Adult Health

## 2022-09-08 ENCOUNTER — Ambulatory Visit (INDEPENDENT_AMBULATORY_CARE_PROVIDER_SITE_OTHER): Payer: Medicaid Other | Admitting: Adult Health

## 2022-09-08 VITALS — BP 123/88 | HR 73 | Ht 63.0 in | Wt 196.0 lb

## 2022-09-08 DIAGNOSIS — N83202 Unspecified ovarian cyst, left side: Secondary | ICD-10-CM | POA: Diagnosis not present

## 2022-09-08 DIAGNOSIS — R1032 Left lower quadrant pain: Secondary | ICD-10-CM

## 2022-09-08 NOTE — Telephone Encounter (Signed)
Communicated with patient via my chart

## 2022-09-08 NOTE — Telephone Encounter (Signed)
Patient was seen at [facility] last night and would like to come in today to be seen. Please advise.

## 2022-09-08 NOTE — Progress Notes (Signed)
  Subjective:     Patient ID: Emily [name], female   DOB: [birth year], 41 y.o.   MRN: [id number]  HPI Brynlee is a 42 year old white female,single, G1P0010, in complaining of LLQ pain started yesterday was seen in ER at [facility] and had [place], has left ovary cyst. No spotting today.      Component Value Date/Time   DIAGPAP (A) 07/30/2022 1120    - Atypical squamous cells of undetermined significance (ASC-US)   HPVHIGH Positive (A) 07/30/2022 1120   ADEQPAP  07/30/2022 1120    Satisfactory for evaluation; transformation zone component PRESENT.  Colpo CIN 1  PCP is Dr Paulina Fusi   Review of Systems LLQ pain No spotting today   Reviewed past medical,surgical, social and family history. Reviewed medications and allergies.  Objective:   Physical Exam BP 123/88 (BP Location: Left Arm, Patient Position: Sitting, Cuff Size: Normal)   Pulse 73   Ht 5\' 3"  (1.6 m)   Wt 196 lb (88.9 kg)   BMI 34.72 kg/m   Skin warm and dry.Pelvic: external genitalia is normal in appearance no lesions, vagina: creamy discharge with slight odor,urethra has no lesions or masses noted, cervix: bulbous, uterus: normal size, shape and contour, non tender, no masses felt, adnexa: no masses, mild LLQ tenderness noted. Bladder is non tender and no masses felt.  Reviewed [place] with her:   FINDINGS:  Uterus: Measures 6.1 x 3.5 x 6.1 cm. No uterine mass identified. The endometrial stripe is normal in thickness measuring 6 mm. There is no free pelvic fluid. There are nabothian cysts in the cervix.   Right ovary: Measures 2.9 x 1.8 x 2.2 cm. Flow present by color and spectral Doppler analysis. No abnormal mass or fluid collection.   Left ovary: Measures 5.9 x 3.3 x 3.9 cm. Flow present by color and spectral Doppler analysis. There is a multiloculated mass in the left adnexa measuring 5.4 x 2.9 x 3.4. This is nonspecific. I cannot confirm any solid components.   Flowsheet Row Office Visit from 09/08/2022 in [facility]  PHQ-2 Total Score 0        Upstream - 09/08/22 1357       Pregnancy Intention Screening   Does the patient want to become pregnant in the next year? No    Does the patient's partner want to become pregnant in the next year? No    Would the patient like to discuss contraceptive options today? No      Contraception Wrap Up   Current Method Abstinence    End Method Abstinence    Contraception Counseling Provided No            Examination chaperoned by Tish RN  Assessment:     1. Cyst of left ovary Left ovary cyst is  bigger was 3.6 cm 08/07/22 on [place], now 5.9 x 2.9 x 3.4 cm  Will check CA 125  - CA 125  2. LLQ pain Has Toradol, take that and rest and push fluids     Plan:     Follow up 09/17/22

## 2022-09-09 ENCOUNTER — Ambulatory Visit: Payer: Medicaid Other | Admitting: Obstetrics & Gynecology

## 2022-09-10 LAB — CA 125: Cancer Antigen (CA) 125: 16.7 U/mL (ref 0.0–38.1)

## 2022-09-11 ENCOUNTER — Other Ambulatory Visit: Payer: Self-pay | Admitting: Adult Health

## 2022-09-11 MED ORDER — NITROFURANTOIN MONOHYD MACRO 100 MG PO CAPS: 100.0000 mg | ORAL_CAPSULE | Freq: Two times a day (BID) | ORAL | 0 refills | Status: DC

## 2022-09-11 NOTE — Progress Notes (Signed)
Rx sent in for macrobid her pt request

## 2022-09-17 ENCOUNTER — Other Ambulatory Visit: Payer: Self-pay

## 2022-09-17 ENCOUNTER — Ambulatory Visit: Payer: Medicaid Other | Admitting: Adult Health

## 2022-09-17 DIAGNOSIS — J45909 Unspecified asthma, uncomplicated: Secondary | ICD-10-CM

## 2022-09-17 MED ORDER — IPRATROPIUM-ALBUTEROL 0.5-2.5 (3) MG/3ML IN SOLN
3.0000 mL | Freq: Three times a day (TID) | RESPIRATORY_TRACT | 11 refills | Status: DC
Start: 2022-09-17 — End: 2023-09-01

## 2022-09-18 ENCOUNTER — Telehealth: Payer: Self-pay

## 2022-09-18 ENCOUNTER — Ambulatory Visit: Payer: Medicaid Other | Admitting: Adult Health

## 2022-09-18 ENCOUNTER — Ambulatory Visit: Payer: Medicaid Other | Admitting: Obstetrics & Gynecology

## 2022-09-18 NOTE — Telephone Encounter (Signed)
Patient would like someone to call her she is bleeding again.

## 2022-09-19 IMAGING — DX DG CHEST 2V
2 series · 2 of 2 positions shown · non-contrast
Comparison: 07/05/2020

CLINICAL DATA: COPD flare.

EXAM:
CHEST - 2 VIEW

[chest pa]
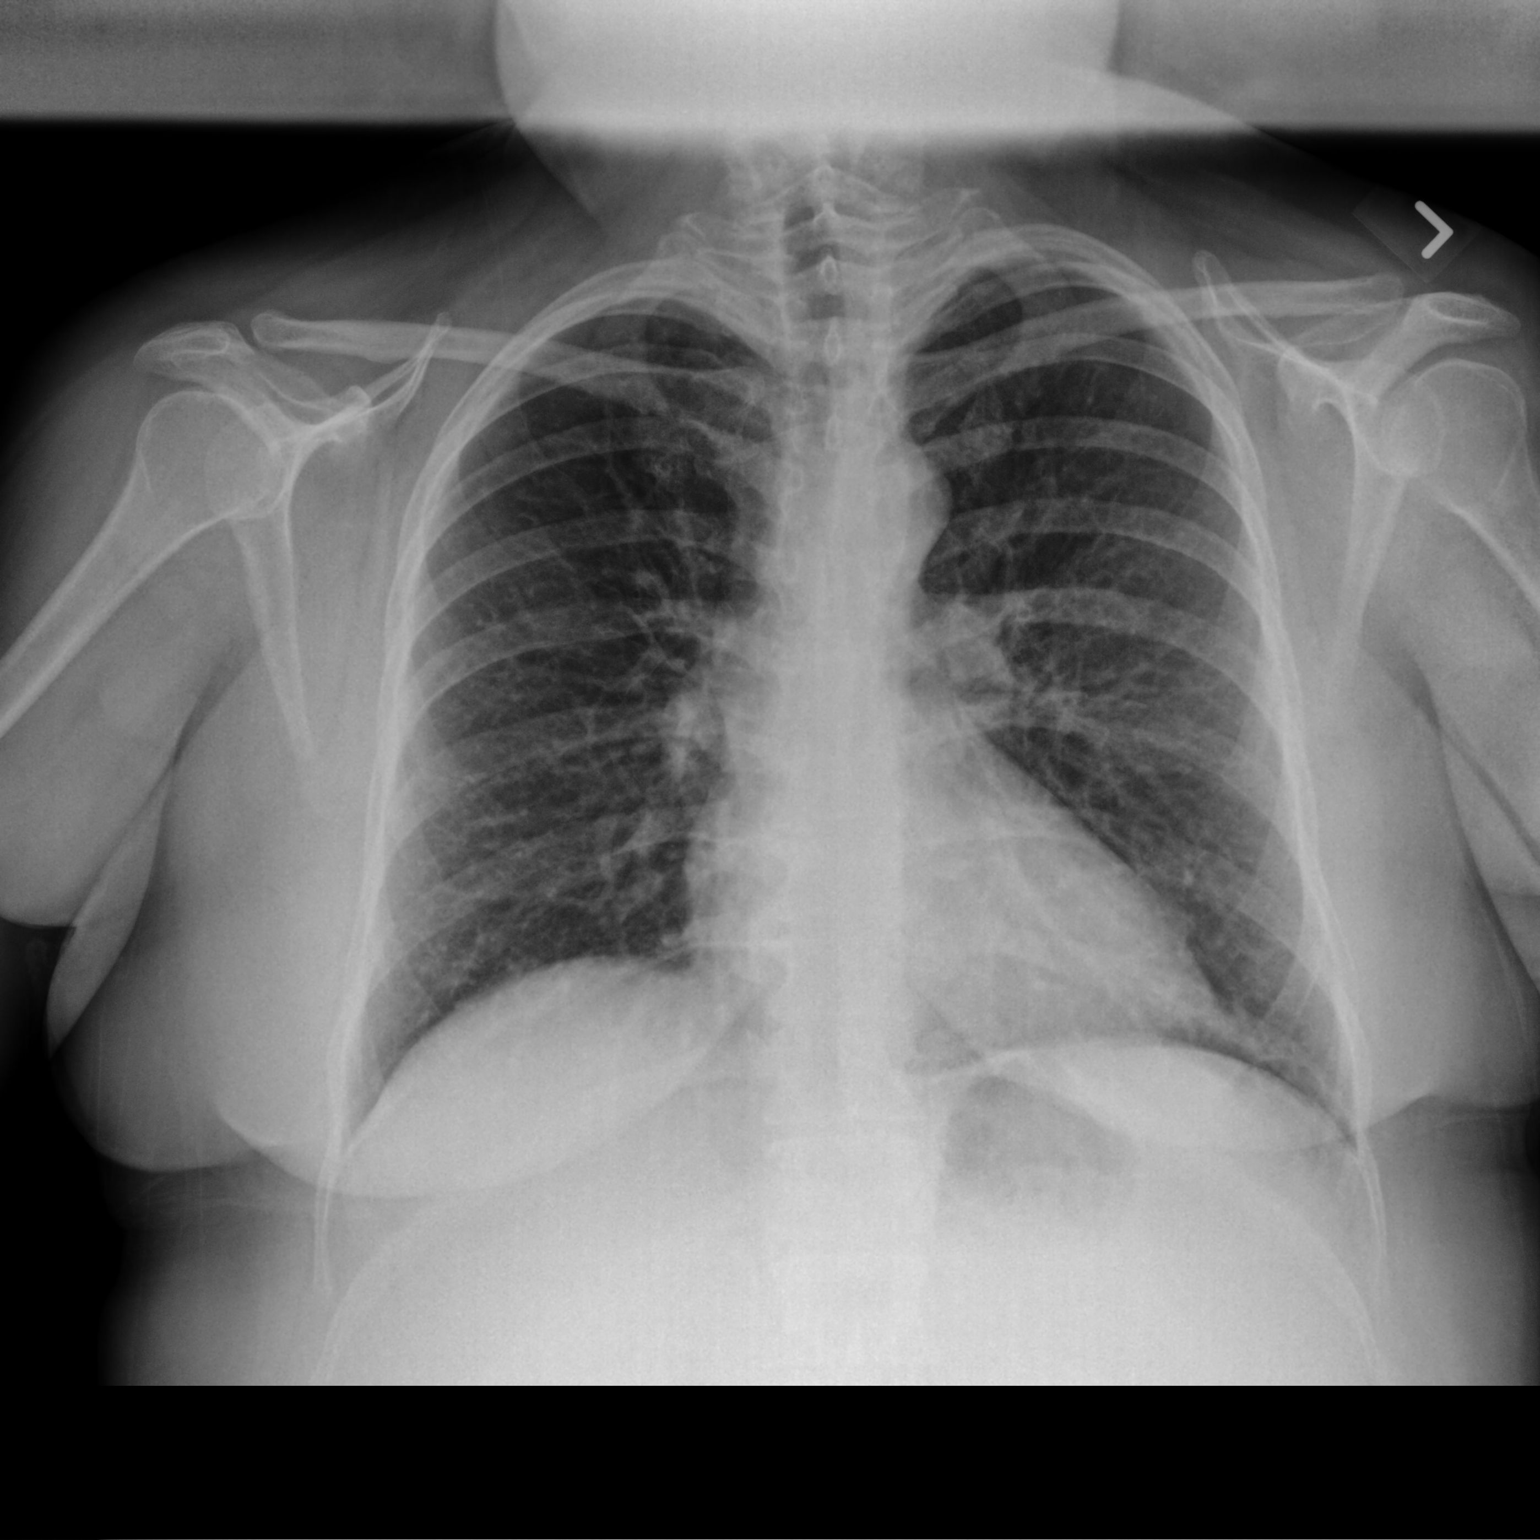

[chest lat]
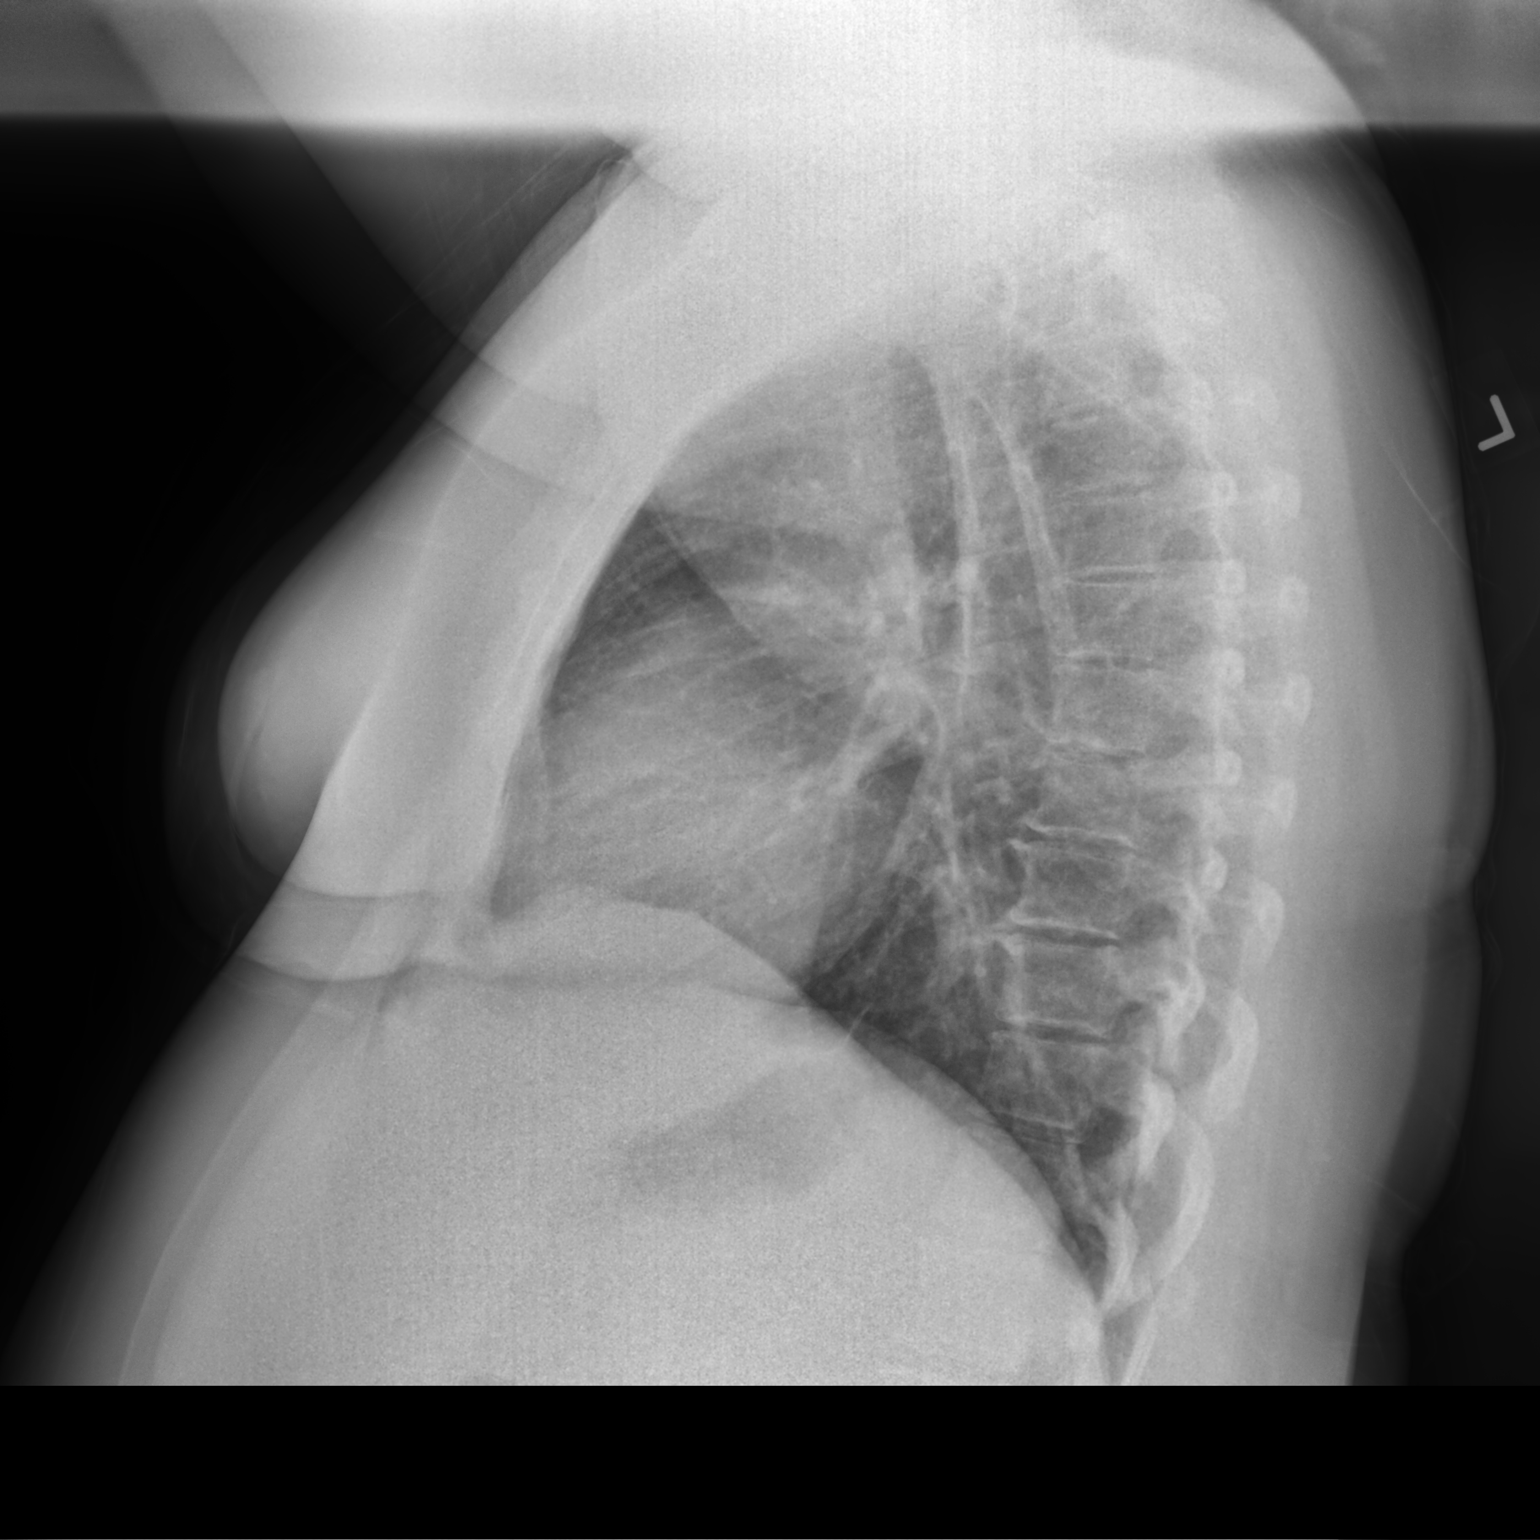

[2 of 2 positions shown; findings below may reference images not displayed]

FINDINGS: The heart size and mediastinal contours are within normal limits.
Prominence of pulmonary interstitial lung markings are stable. No
evidence of acute infiltrate or pleural effusion. The visualized
skeletal structures are unremarkable.
IMPRESSION: Stable exam. No active cardiopulmonary disease.

## 2022-09-23 ENCOUNTER — Other Ambulatory Visit: Payer: Self-pay | Admitting: Adult Health

## 2022-09-23 DIAGNOSIS — N83202 Unspecified ovarian cyst, left side: Secondary | ICD-10-CM

## 2022-09-23 NOTE — Progress Notes (Unsigned)
New Patient Office Visit  Subjective    Patient ID: Emily [name], female    DOB: 10/10/1980  Age: 41 y.o. MRN: [id number]  CC: No chief complaint on file.   HPI Emily [name] presents to establish care with new provider.   Patients previous primary care provider is Reynaldo Minium, PA with [facility]. Last seen  Reason for transition   Specialist:  [facility] Durene Romans, [facility] for [facility] at [facility]  with Cyril Mourning, NP.  [place]  [facility] at [facility] with Dr. Kalman Shan  Psych-  Outpatient Encounter Medications as of 09/24/2022  Medication Sig   ACCU-CHEK AVIVA PLUS test strip 1 each by Other route 3 (three) times daily.   ACCU-CHEK FASTCLIX LANCETS MISC USE AS DIRECTED   albuterol (VENTOLIN HFA) 108 (90 Base) MCG/ACT inhaler Inhale 2 puffs into the lungs every 6 (six) hours as needed for wheezing or shortness of breath.   amitriptyline (ELAVIL) 25 MG tablet Take 50 mg by mouth at bedtime.   amLODipine (NORVASC) 5 MG tablet Take 1 tablet (5 mg total) by mouth daily.   atorvastatin (LIPITOR) 40 MG tablet Take 40 mg by mouth daily.   benzonatate (TESSALON) 200 MG capsule Take 1 capsule (200 mg total) by mouth 3 (three) times daily as needed.   Blood Glucose Monitoring Suppl (ACCU-CHEK AVIVA PLUS) w/Device KIT USE AS DIRECTED   clonazePAM (KLONOPIN) 0.5 MG disintegrating tablet Take 0.5 mg by mouth daily as needed.   diphenhydrAMINE (BENADRYL) 50 MG tablet Take 1 tablet (50 mg total) by mouth once for 1 dose. Take one hour prior to test.   fluticasone-salmeterol (ADVAIR) 250-50 MCG/ACT AEPB Inhale 1 puff into the lungs every 12 (twelve) hours. Rinse after use.   HUMALOG KWIKPEN 100 UNIT/ML KwikPen SUB-Q 3 Times Daily   Insulin Pen Needle 32G X 4 MM MISC 1 each by Does not apply route as needed.   ipratropium-albuterol (DUONEB) 0.5-2.5 (3) MG/3ML SOLN Take 3 mLs by  nebulization in the morning, at noon, and at bedtime.   ketorolac (TORADOL) 10 MG tablet Take by mouth.   lamoTRIgine (LAMICTAL) 150 MG tablet Take 150 mg by mouth daily.   levothyroxine (SYNTHROID, LEVOTHROID) 75 MCG tablet Take 1 tablet (75 mcg total) by mouth daily before breakfast.   losartan (COZAAR) 50 MG tablet Take 1 tablet (50 mg total) by mouth daily.   metroNIDAZOLE (METROGEL) 0.75 % vaginal gel Place 1 Applicatorful vaginally at bedtime.   montelukast (SINGULAIR) 10 MG tablet Take 1 tablet (10 mg total) by mouth at bedtime.   Nebulizers (COMPRESSOR/NEBULIZER) MISC Use as directed   nitrofurantoin, macrocrystal-monohydrate, (MACROBID) 100 MG capsule Take 1 capsule (100 mg total) by mouth 2 (two) times daily.   nitroGLYCERIN (NITROSTAT) 0.4 MG SL tablet Place 1 tablet (0.4 mg total) under the tongue every 5 (five) minutes as needed for chest pain.   omeprazole (PRILOSEC) 40 MG capsule Take 40 mg by mouth every morning.   paroxetine mesylate (PEXEVA) 20 MG tablet Take 20 mg by mouth daily.   PAXIL 20 MG tablet Take 20 mg by mouth daily.   promethazine (PHENERGAN) 12.5 MG tablet Take 12.5 mg by mouth every 8 (eight) hours as needed.   propranolol (INDERAL) 20 MG tablet TAKE 1 TABLET BY MOUTH THREE TIMES A DAY   Respiratory Therapy Supplies (NEBULIZER/TUBING/MOUTHPIECE) KIT Use as directed   traZODone (DESYREL) 50 MG tablet Take 50-100 mg  by mouth at bedtime.   Vitamin D, Ergocalciferol, (DRISDOL) 1.25 MG (50000 UNIT) CAPS capsule Take 50,000 Units by mouth once a week.   ziprasidone (GEODON) 60 MG capsule Take 60 mg by mouth daily.   No facility-administered encounter medications on file as of 09/24/2022.    Past Medical History:  Diagnosis Date   Abnormal CT of liver    Anxiety    Asthma    Asthma    Bipolar 1 disorder (HCC)    Bipolar disorder (HCC)    Chronic pain    Cirrhosis (HCC)    Depression    Fatty liver    Gallstones    GERD (gastroesophageal reflux disease)     Headache(784.0)    Hyperlipidemia    Hypertension    Pulmonary nodule    Tachycardia    Thyroid disease     Past Surgical History:  Procedure Laterality Date   DILATION AND CURETTAGE OF UTERUS     1 yr ago   UPPER GI ENDOSCOPY  08/28/2022    Family History  Problem Relation Age of Onset   Asthma Mother    Valvular heart disease Father        Mitral valve repair Dr. Cornelius Moras 2007   Diabetes Maternal Grandmother    Hypertension Maternal Grandmother     Social History   Socioeconomic History   Marital status: Single    Spouse name: Not on file   Number of children: Not on file   Years of education: Not on file   Highest education level: Not on file  Occupational History   Not on file  Tobacco Use   Smoking status: Every Day    Packs/day: 1.50    Years: 20.00    Additional pack years: 0.00    Total pack years: 30.00    Types: Cigarettes   Smokeless tobacco: Never   Tobacco comments:    Smoking a pack per day.  03/11/2022 hfb  Vaping Use   Vaping Use: Never used  Substance and Sexual Activity   Alcohol use: No   Drug use: No   Sexual activity: Not Currently    Partners: Male    Birth control/protection: Abstinence  Other Topics Concern   Not on file  Social History Narrative   Not on file   Social Determinants of Health   Financial Resource Strain: Not on file  Food Insecurity: Not on file  Transportation Needs: Not on file  Physical Activity: Not on file  Stress: Not on file  Social Connections: Not on file  Intimate Partner Violence: Not on file    ROS See HPI above    Objective    There were no vitals taken for this visit.  Physical Exam    Assessment & Plan:  There are no diagnoses linked to this encounter.  No follow-ups on file.   Zandra Abts, NP

## 2022-09-23 NOTE — Progress Notes (Signed)
Will get follow up US scheduled, at [facility] 10/06/22 at 1:30 pm

## 2022-09-24 ENCOUNTER — Encounter: Payer: Self-pay | Admitting: Family Medicine

## 2022-09-24 ENCOUNTER — Ambulatory Visit (INDEPENDENT_AMBULATORY_CARE_PROVIDER_SITE_OTHER): Payer: Medicaid Other | Admitting: Family Medicine

## 2022-09-24 VITALS — BP 118/64 | HR 69 | Temp 98.3°F | Ht 63.0 in | Wt 197.1 lb

## 2022-09-24 DIAGNOSIS — E039 Hypothyroidism, unspecified: Secondary | ICD-10-CM | POA: Diagnosis not present

## 2022-09-24 DIAGNOSIS — E559 Vitamin D deficiency, unspecified: Secondary | ICD-10-CM

## 2022-09-24 DIAGNOSIS — E785 Hyperlipidemia, unspecified: Secondary | ICD-10-CM | POA: Diagnosis not present

## 2022-09-24 DIAGNOSIS — Z7689 Persons encountering health services in other specified circumstances: Secondary | ICD-10-CM

## 2022-09-24 DIAGNOSIS — Z114 Encounter for screening for human immunodeficiency virus [HIV]: Secondary | ICD-10-CM

## 2022-09-24 DIAGNOSIS — Z1159 Encounter for screening for other viral diseases: Secondary | ICD-10-CM

## 2022-09-24 DIAGNOSIS — E118 Type 2 diabetes mellitus with unspecified complications: Secondary | ICD-10-CM | POA: Diagnosis not present

## 2022-09-24 MED ORDER — HUMALOG KWIKPEN 100 UNIT/ML ~~LOC~~ SOPN
10.0000 [IU] | PEN_INJECTOR | Freq: Three times a day (TID) | SUBCUTANEOUS | 2 refills | Status: DC
Start: 2022-09-24 — End: 2023-04-12

## 2022-09-24 MED ORDER — LEVOTHYROXINE SODIUM 75 MCG PO TABS
75.0000 ug | ORAL_TABLET | Freq: Every day | ORAL | 0 refills | Status: DC
Start: 2022-09-24 — End: 2022-12-21

## 2022-09-24 NOTE — Patient Instructions (Addendum)
-  It was a pleasure to meet you and I look forward to taking care of you.  -Ordered labs and urine for chronic management disease. Office will call with lab results and you may see them on MyChart.  -Refilled Levothyroxine for hypothyroidism and Humalog insulin for diabetes. -Referral placed to endocrinology. If you do not hear back in 2 weeks about an appointment, please call back to the office. -Follow up in 1 month.

## 2022-09-24 NOTE — Addendum Note (Signed)
Addended by: Zandra Abts R on: 09/24/2022 04:26 PM   Modules accepted: Level of Service

## 2022-09-24 NOTE — Assessment & Plan Note (Signed)
Ordered TSH. Refilled Levothyroxine daily. She has been out of medication for about 2 weeks.

## 2022-09-24 NOTE — Assessment & Plan Note (Addendum)
Ordered A1c, CBC, and CMP, and microalbumin/creatinine urine ratio. Foot exam completed today. She is scheduled for August 7th for ophthalmologist appointment with in office [facility] eye visit. Refilled Humalog 10U three times a day as she previous been prescribed. Placed a referral to endocrinology.

## 2022-09-25 ENCOUNTER — Telehealth: Payer: Self-pay

## 2022-09-25 LAB — COMPREHENSIVE METABOLIC PANEL
ALT: 38 U/L — ABNORMAL HIGH (ref 0–35)
AST: 54 U/L — ABNORMAL HIGH (ref 0–37)
Albumin: 4 g/dL (ref 3.5–5.2)
Alkaline Phosphatase: 134 U/L — ABNORMAL HIGH (ref 39–117)
BUN: 7 mg/dL (ref 6–23)
CO2: 27 mEq/L (ref 19–32)
Calcium: 9 mg/dL (ref 8.4–10.5)
Chloride: 97 mEq/L (ref 96–112)
Creatinine, Ser: 0.65 mg/dL (ref 0.40–1.20)
GFR: 109.8 mL/min (ref >60.00)
Glucose, Bld: 223 mg/dL — ABNORMAL HIGH (ref 70–99)
Potassium: 4.2 mEq/L (ref 3.5–5.1)
Sodium: 132 mEq/L — ABNORMAL LOW (ref 135–145)
Total Bilirubin: 0.9 mg/dL (ref 0.2–1.2)
Total Protein: 7.8 g/dL (ref 6.0–8.3)

## 2022-09-25 LAB — CBC WITH DIFFERENTIAL/PLATELET
Basophils Absolute: 0.1 10*3/uL (ref 0.0–0.1)
Basophils Relative: 0.7 % (ref 0.0–3.0)
Eosinophils Absolute: 0.3 10*3/uL (ref 0.0–0.7)
Eosinophils Relative: 2.2 % (ref 0.0–5.0)
HCT: 43 % (ref 36.0–46.0)
Hemoglobin: 14.2 g/dL (ref 12.0–15.0)
Lymphocytes Relative: 38.1 % (ref 12.0–46.0)
Lymphs Abs: 4.6 10*3/uL — ABNORMAL HIGH (ref 0.7–4.0)
MCHC: 33 g/dL (ref 30.0–36.0)
MCV: 115.2 fl — ABNORMAL HIGH (ref 78.0–100.0)
Monocytes Absolute: 0.5 10*3/uL (ref 0.1–1.0)
Monocytes Relative: 3.9 % (ref 3.0–12.0)
Neutro Abs: 6.6 10*3/uL (ref 1.4–7.7)
Neutrophils Relative %: 55.1 % (ref 43.0–77.0)
Platelets: 234 10*3/uL (ref 150.0–400.0)
RBC: 3.73 Mil/uL — ABNORMAL LOW (ref 3.87–5.11)
RDW: 14.1 % (ref 11.5–15.5)
WBC: 12 10*3/uL — ABNORMAL HIGH (ref 4.0–10.5)

## 2022-09-25 LAB — LIPID PANEL
Cholesterol: 202 mg/dL — ABNORMAL HIGH (ref 0–200)
HDL: 35.3 mg/dL — ABNORMAL LOW (ref >39.00)
NonHDL: 167.13
Total CHOL/HDL Ratio: 6
Triglycerides: 312 mg/dL — ABNORMAL HIGH (ref 0.0–149.0)
VLDL: 62.4 mg/dL — ABNORMAL HIGH (ref 0.0–40.0)

## 2022-09-25 LAB — HIV ANTIBODY (ROUTINE TESTING W REFLEX): HIV 1&2 Ab, 4th Generation: NONREACTIVE

## 2022-09-25 LAB — MICROALBUMIN / CREATININE URINE RATIO
Creatinine,U: 147.1 mg/dL
Microalb Creat Ratio: 1.5 mg/g (ref 0.0–30.0)
Microalb, Ur: 2.2 mg/dL — ABNORMAL HIGH (ref 0.0–1.9)

## 2022-09-25 LAB — VITAMIN D 25 HYDROXY (VIT D DEFICIENCY, FRACTURES): VITD: 29.76 ng/mL — ABNORMAL LOW (ref 30.00–100.00)

## 2022-09-25 LAB — LDL CHOLESTEROL, DIRECT: Direct LDL: 132 mg/dL

## 2022-09-25 LAB — HEPATITIS C ANTIBODY: Hepatitis C Ab: NONREACTIVE

## 2022-09-25 LAB — HEMOGLOBIN A1C: Hgb A1c MFr Bld: 9.7 % — ABNORMAL HIGH (ref 4.6–6.5)

## 2022-09-25 LAB — TSH: TSH: 2.24 u[IU]/mL (ref 0.35–5.50)

## 2022-09-25 NOTE — Telephone Encounter (Signed)
Caller name: ARTRICE STMARY  On DPR?: Yes  Call back number:  [phone]  Provider they see: Alveria Apley, NP  Reason for call: Lab results

## 2022-09-26 ENCOUNTER — Other Ambulatory Visit: Payer: Self-pay

## 2022-09-26 ENCOUNTER — Telehealth: Payer: Self-pay

## 2022-09-26 ENCOUNTER — Encounter: Payer: Self-pay | Admitting: Family Medicine

## 2022-09-26 NOTE — Telephone Encounter (Signed)
-----   Message from Alveria Apley, NP sent at 09/26/2022  8:58 AM EDT ----- Total cholesterol and bad (LDL) are elevated, triglycerides are elevated, and good (HDL) cholesterol is not at goal. Diet encouraged - increase intake of fresh fruits and vegetables, increase intake of lean proteins. Bake, broil, or grill foods. Avoid fried, greasy, and fatty foods. Avoid fast foods. Increase intake of fiber-rich whole grains. Exercise encouraged - at least 150 minutes per week and advance as tolerated.  Continue taking Atorvastatin 40mg  daily.  Sodium is slightly low, could be from elevated blood sugars.  Liver enzymes are still elevated, but not as elevated as previously.  A1c is 9.7, not controlled. Recommend to take Humalog insulin as refilled at appointment until you have an appointment with endocrinology. If you do not hear back in 2 week about an appointment, please call the office. Recommend to decrease your carbohydrates, such as rice, potatoes, pasta, and sweets.  Vitamin D is almost at goal, recommend to take over the counter Vitamin D3 1,000IU tablet, 1 tablet daily.  TSH is normal, continue with Levothyroxine tablet.  All other labs are stable or around the your baseline.

## 2022-09-26 NOTE — Telephone Encounter (Signed)
Patient declined reports made her feel strange

## 2022-09-29 NOTE — Telephone Encounter (Signed)
If she is having chest pain and lung issues will need office visit for further evaluation -if unable to work in will need to go the emergency room or urgent care   Please contact office for sooner follow up if symptoms do not improve or worsen or seek emergency care

## 2022-09-29 NOTE — Telephone Encounter (Signed)
Message received for you from patient.   I been having some chest and lung pain my breathing has been heavy I was wondering could we do a earlier CT scan or xray    Message routed to [name],NP to advise

## 2022-10-01 ENCOUNTER — Encounter: Payer: Self-pay | Admitting: Family Medicine

## 2022-10-02 ENCOUNTER — Ambulatory Visit (INDEPENDENT_AMBULATORY_CARE_PROVIDER_SITE_OTHER)
Admission: RE | Admit: 2022-10-02 | Discharge: 2022-10-02 | Disposition: A | Discharge status: Home / Self Care | Payer: Medicaid Other | Source: Ambulatory Visit | Attending: Family Medicine | Admitting: Family Medicine

## 2022-10-02 ENCOUNTER — Ambulatory Visit (INDEPENDENT_AMBULATORY_CARE_PROVIDER_SITE_OTHER): Payer: Medicaid Other | Admitting: Family Medicine

## 2022-10-02 VITALS — BP 118/62 | HR 85 | Temp 98.4°F | Ht 63.0 in | Wt 196.6 lb

## 2022-10-02 DIAGNOSIS — R052 Subacute cough: Secondary | ICD-10-CM

## 2022-10-02 DIAGNOSIS — R002 Palpitations: Secondary | ICD-10-CM | POA: Diagnosis not present

## 2022-10-02 DIAGNOSIS — D72829 Elevated white blood cell count, unspecified: Secondary | ICD-10-CM

## 2022-10-02 NOTE — Patient Instructions (Signed)
Recent cough could have been from environmental exposure with new location, or flare of asthma, previous lung issues, heartburn flare, or virus.  Either way as it is improving I do not think any antibiotics are needed right now unless any concerns on your x-ray.  See location for that below.  Continue heartburn medication in the morning, and add Pepcid at bedtime if persistent heartburn flares. Be seen if any worsening cough, fever, shortness of breath or other new/worsening symptoms in the next few days.  [facility] or xray: Walk in 8:30-4:30 during weekdays, no appointment needed [address]  [place], [place] [address]   See below on information for palpitations.  That can occur with increased caffeine, as well as increased use of albuterol.  As her symptoms are improving and less need for albuterol, palpitations should also improve but I recommend cutting back on caffeine, make sure to drink plenty of fluids.  If heart palpitations do not resolve in the next day or 2, let us know we can refer you to cardiology.  If any worsening symptoms or new symptoms with those heart palpitations be seen through urgent care or emergency room as we discussed.  I am checking some blood work again today and we will let you know those results.  Return to the clinic or go to the nearest emergency room if any of your symptoms worsen or new symptoms occur.  Palpitations Palpitations are feelings that your heartbeat is irregular or is faster than normal. It may feel like your heart is fluttering or skipping a beat. Palpitations may be caused by many things, including smoking, caffeine, alcohol, stress, and certain medicines or drugs. Most causes of palpitations are not serious.  However, some palpitations can be a sign of a serious problem. Further tests and a thorough medical history will be done to find the cause of your palpitations. Your provider may order tests such as an ECG, labs, an echocardiogram, or an  ambulatory continuous ECG monitor. Follow these instructions at home: Pay attention to any changes in your symptoms. Let your health care provider know about them. Take these actions to help manage your symptoms: Eating and drinking Follow instructions from your health care provider about eating or drinking restrictions. You may need to avoid foods and drinks that may cause palpitations. These may include: Caffeinated coffee, tea, soft drinks, and energy drinks. Chocolate. Alcohol. Diet pills. Lifestyle     Take steps to reduce your stress and anxiety. Things that can help you relax include: Yoga. Mind-body activities, such as deep breathing, meditation, or using words and images to create positive thoughts (guided imagery). Physical activity, such as swimming, jogging, or walking. Tell your health care provider if your palpitations increase with activity. If you have chest pain or shortness of breath with activity, do not continue the activity until you are seen by your health care provider. Biofeedback. This is a method that helps you learn to use your mind to control things in your body, such as your heartbeat. Get plenty of rest and sleep. Keep a regular bed time. Do not use drugs, including cocaine or ecstasy. Do not use marijuana. Do not use any products that contain nicotine or tobacco. These products include cigarettes, chewing tobacco, and vaping devices, such as e-cigarettes. If you need help quitting, ask your health care provider. General instructions Take over-the-counter and prescription medicines only as told by your health care provider. Keep all follow-up visits. This is important. These may include visits  for further testing if palpitations do not go away or get worse. Contact a health care provider if: You continue to have a fast or irregular heartbeat for a long period of time. You notice that your palpitations occur more often. Get help right away if: You have chest  pain or shortness of breath. You have a severe headache. You feel dizzy or you faint. These symptoms may represent a serious problem that is an emergency. Do not wait to see if the symptoms will go away. Get medical help right away. Call your local emergency services (911 in the [place] Do not drive yourself to the hospital. Summary Palpitations are feelings that your heartbeat is irregular or is faster than normal. It may feel like your heart is fluttering or skipping a beat. Palpitations may be caused by many things, including smoking, caffeine, alcohol, stress, certain medicines, and drugs. Further tests and a thorough medical history may be done to find the cause of your palpitations. Get help right away if you faint or have chest pain, shortness of breath, severe headache, or dizziness. This information is not intended to replace advice given to you by your health care provider. Make sure you discuss any questions you have with your health care provider. Document Revised: 08/29/2020 Document Reviewed: 08/29/2020 Elsevier Patient Education  2024 [employer] Inc.    Cough, Adult Coughing is a reflex that clears your throat and airways (respiratory system). It helps heal and protect your lungs. It is normal to cough from time to time. A cough that happens with other symptoms or that lasts a long time may be a sign of a condition that needs treatment. A short-term (acute) cough may only last 2-3 weeks. A long-term (chronic) cough may last 8 or more weeks. Coughing is often caused by: Diseases, such as: An infection of the respiratory system. Asthma or other heart or lung diseases. Gastroesophageal reflux. This is when acid comes back up from the stomach. Breathing in things that irritate your lungs. Allergies. Postnasal drip. This is when mucus runs down the back of your throat. Smoking. Some medicines. Follow these instructions at home: Medicines Take over-the-counter and prescription  medicines only as told by your health care provider. Talk with your provider before you take cough medicine (cough suppressants). Eating and drinking Do not drink alcohol. Avoid caffeine. Drink enough fluid to keep your pee (urine) pale yellow. Lifestyle Avoid cigarette smoke. Do not use any products that contain nicotine or tobacco. These products include cigarettes, chewing tobacco, and vaping devices, such as e-cigarettes. If you need help quitting, ask your provider. Avoid things that make you cough. These may include perfumes, candles, cleaning products, or campfire smoke. General instructions  Watch for any changes to your cough. Tell your provider about them. Always cover your mouth when you cough. If the air is dry in your bedroom or home, use a cool mist vaporizer or humidifier. If your cough is worse at night, try to sleep in a semi-upright position. Rest as needed. Contact a health care provider if: You have new symptoms, or your symptoms get worse. You cough up pus. You have a fever that does not go away or a cough that does not get better after 2-3 weeks. You cannot control your cough with medicine, and you are losing sleep. You have pain that gets worse or is not helped with medicine. You lose weight for no clear reason. You have night sweats. Get help right away if: You cough up blood. You  have trouble breathing. Your heart is beating very fast. These symptoms may be an emergency. Get help right away. Call 911. Do not wait to see if the symptoms will go away. Do not drive yourself to the hospital. This information is not intended to replace advice given to you by your health care provider. Make sure you discuss any questions you have with your health care provider. Document Revised: 12/06/2021 Document Reviewed: 12/06/2021 Elsevier Patient Education  2024 [employer].

## 2022-10-02 NOTE — Progress Notes (Signed)
Subjective:  Patient ID: Emily [name], female    DOB: Oct 02, 1980  Age: 42 y.o. MRN: [id number]  CC:  Chief Complaint  Patient presents with   Cough    Notes she has a cough since moving into a basement room and changing cigarettes, started apx 1 week ago notes also intermittently losing her voice, notes she is tachycardic with a history of such, does have some fluttering feeling in her chest     HPI Emily [name] presents for   Cough: Chronic cough, worse past 1 week, noticed after moving into a basement and change of cigarettes.   Intermittent hoarse voice. No fever. Some wheezing yesterday, better today.  Hx of asthma, pulmonary nodules. Has been using incruse and duoneb. Albuterol used more frequently recently.  Breakthrough heartburn at times. Taking omeprazole in am before meal.  No sick contacts. Feels a little better today. Voice is better today.  Overall better today with all symptoms.  Palpitations Reports elevated heart rate, some fluttering feeling in her chest. Noticed 2 nights ago watching tv. Fast heartrate at that time.felt racing. No associated CP, near-syncope, diaphoresis, n/v. Lasted few minutes. Returned today - fluttering and fast heartrate - again few minutes, no associated symptoms.  No treatment.  Prior symptoms year ago. No known hx of arrhythmia. Cards - Dr. Melburn Popper, retired.  Hx of hypothyroidism on synthroid.  Caffeine - Dr. Reino Kent 4 bottles per day.  Leukocytosis last week. No fever.  Lab Results  Component Value Date   TSH 2.24 09/24/2022   Lab Results  Component Value Date   WBC 12.0 (H) 09/24/2022   HGB 14.2 09/24/2022   HCT 43.0 09/24/2022   MCV 115.2 Repeated and verified X2. (H) 09/24/2022   PLT 234.0 09/24/2022     CoughHistory Patient Active Problem List   Diagnosis Date Noted   Cyst of left ovary 08/11/2022   ASCUS with positive high risk HPV cervical 08/11/2022   Moody 07/30/2022   Hot flashes 07/30/2022   Friable cervix  07/30/2022   Spotting 07/30/2022   Routine Papanicolaou smear 07/30/2022   Pregnancy examination or test, negative result 07/30/2022   Amenorrhea, secondary 07/30/2022   Bartholin cyst 04/30/2022   Irregular periods 04/30/2022   Dental caries 03/11/2022   Tick bite of back 12/20/2020   Chronic bronchitis (HCC) 07/09/2020   Breast tenderness 09/20/2018   Gastroesophageal reflux disease 09/16/2018   Shortness of breath 08/05/2018   Abnormal finding on lung imaging 08/05/2018   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   Vaginal odor 09/30/2017   BV (bacterial vaginosis) 09/30/2017   Swollen lymph nodes 01/06/2017   Vaginal irritation 10/08/2016   RUQ pain 10/08/2016   LLQ pain 10/08/2016   Vaginal discharge 10/08/2016   Menstrual period late 10/08/2016   Cyst of right Bartholin's gland 12/17/2015   Trichomonal vaginitis 10/16/2015   Recurrent boils 06/18/2015   Type 2 diabetes mellitus with complication, without long-term current use of insulin (HCC) 04/18/2015   Need for influenza vaccination 04/18/2015   Ingrowing toenail 04/18/2015   Vitamin D deficiency 04/18/2015   Angular cheilitis 04/18/2015   Dyslipidemia 04/18/2015   Diabetes mellitus with complication in adult patient (HCC) 02/20/2015   Smoker 02/20/2015   Essential hypertension 02/20/2015   Pain in limb 02/20/2015   Myalgia 02/20/2015   Hypothyroidism 02/20/2015   Leg pain, inferior 02/20/2015   Toenail deformity 02/20/2015   Elevated liver enzymes 01/07/2012   Abnormal CT of liver 09/30/2011   Fatty liver 09/30/2011  G E REFLUX 11/30/2007   CHEST PAIN-UNSPECIFIED 11/30/2007   HEADACHE 10/21/2007   DYSPNEA 09/15/2007   UNSPECIFIED TACHYCARDIA 06/01/2007   COUGH 05/24/2007   URINARY INCONTINENCE 05/24/2007   TOBACCO ABUSE 05/20/2007   ASTHMATIC BRONCHITIS, ACUTE 05/18/2007   Pulmonary nodules 05/12/2007   DEPRESSION 04/19/2007   Asthma 04/19/2007   Past Medical History:  Diagnosis Date   Abnormal CT of liver     Anxiety    Asthma    Asthma    Bipolar 1 disorder (HCC)    Bipolar disorder (HCC)    Chronic pain    Cirrhosis (HCC)    Depression    Diabetes mellitus without complication (HCC)    Fatty liver    Gallstones    GERD (gastroesophageal reflux disease)    Headache(784.0)    Hyperlipidemia    Hypertension    Hypothyroidism    Pulmonary nodule    Tachycardia    Thyroid disease    Vitamin D deficiency    Past Surgical History:  Procedure Laterality Date   DILATION AND CURETTAGE OF UTERUS     1 yr ago   UPPER GI ENDOSCOPY  08/28/2022   Allergies  Allergen Reactions   Bactrim [Sulfamethoxazole-Trimethoprim] Anaphylaxis and Other (See Comments)    Chest pain, trouble breathing   Ciprofloxacin Palpitations   Clindamycin/Lincomycin     Other reaction(s): Chest Pain   Flagyl [Metronidazole] Shortness Of Breath   Iodinated Contrast Media Swelling   Ondansetron Shortness Of Breath   Liraglutide Nausea And Vomiting   Doxycycline Nausea And Vomiting   Penicillins    Prednisone Other (See Comments)    Makes her angry and irritable   Prior to Admission medications   Medication Sig Start Date End Date Taking? Authorizing Provider  ACCU-CHEK AVIVA PLUS test strip 1 each by Other route 3 (three) times daily. 04/28/20  Yes [provider]  ACCU-CHEK FASTCLIX LANCETS MISC USE AS DIRECTED 12/10/17  Yes Tysinger, Kermit Balo, PA-C  albuterol (VENTOLIN HFA) 108 (90 Base) MCG/ACT inhaler Inhale 2 puffs into the lungs every 6 (six) hours as needed for wheezing or shortness of breath. 12/12/20  Yes Bevelyn Ngo, NP  amitriptyline (ELAVIL) 25 MG tablet Take 50 mg by mouth at bedtime. 08/29/19  Yes [provider]  amLODipine (NORVASC) 5 MG tablet Take 1 tablet (5 mg total) by mouth daily. 12/09/21  Yes Nahser, Deloris Ping, MD  atorvastatin (LIPITOR) 40 MG tablet Take 40 mg by mouth daily. 02/05/21  Yes [provider]  benzonatate (TESSALON) 200 MG capsule Take 1 capsule (200  mg total) by mouth 3 (three) times daily as needed. 03/11/22 03/11/23 Yes Parrett, [name] S, NP  Blood Glucose Monitoring Suppl (ACCU-CHEK AVIVA PLUS) w/Device KIT USE AS DIRECTED 05/01/16  Yes Tysinger, Kermit Balo, PA-C  clonazePAM (KLONOPIN) 0.5 MG disintegrating tablet Take 0.5 mg by mouth daily as needed. 11/22/19  Yes [provider]  fluticasone-salmeterol (ADVAIR) 250-50 MCG/ACT AEPB Inhale 1 puff into the lungs every 12 (twelve) hours. Rinse after use. 07/22/22  Yes Parrett, [name] S, NP  HUMALOG KWIKPEN 100 UNIT/ML KwikPen Inject 10 Units into the skin 3 (three) times daily. SUB-Q 3 Times Daily 09/24/22 02/21/23 Yes Alveria Apley, NP  hydrOXYzine (ATARAX) 25 MG tablet SMARTSIG:1 Tablet(s) By Mouth Every Evening 09/13/22  Yes [provider]  INCRUSE ELLIPTA 62.5 MCG/ACT AEPB SMARTSIG:2 Puff(s) Via Inhaler Daily 09/10/22  Yes [provider]  ipratropium-albuterol (DUONEB) 0.5-2.5 (3) MG/3ML SOLN Take 3 mLs by  nebulization in the morning, at noon, and at bedtime. 09/17/22  Yes Parrett, [name] S, NP  ketorolac (TORADOL) 10 MG tablet Take by mouth. 09/07/22  Yes [provider]  lamoTRIgine (LAMICTAL) 150 MG tablet Take 150 mg by mouth daily.   Yes [provider]  levothyroxine (SYNTHROID) 75 MCG tablet Take 1 tablet (75 mcg total) by mouth daily before breakfast. 09/24/22 12/23/22 Yes Alveria Apley, NP  losartan (COZAAR) 50 MG tablet Take 1 tablet (50 mg total) by mouth daily. 11/08/20  Yes Nahser, Deloris Ping, MD  montelukast (SINGULAIR) 10 MG tablet Take 1 tablet (10 mg total) by mouth at bedtime. 04/22/22  Yes Parrett, Virgel Bouquet, NP  Nebulizers (COMPRESSOR/NEBULIZER) MISC Use as directed 11/15/19  Yes Kalman Shan, MD  nitrofurantoin, macrocrystal-monohydrate, (MACROBID) 100 MG capsule Take 1 capsule (100 mg total) by mouth 2 (two) times daily. 09/11/22  Yes Adline Potter, NP  nitroGLYCERIN (NITROSTAT) 0.4 MG SL tablet Place 1 tablet (0.4 mg total)  under the tongue every 5 (five) minutes as needed for chest pain. 03/10/22  Yes Nahser, Deloris Ping, MD  omeprazole (PRILOSEC) 40 MG capsule Take 40 mg by mouth every morning. 08/05/21  Yes [provider]  PAXIL 20 MG tablet Take 20 mg by mouth daily. 08/29/19  Yes [provider]  promethazine (PHENERGAN) 12.5 MG tablet Take 12.5 mg by mouth every 8 (eight) hours as needed. 09/24/21  Yes [provider]  propranolol (INDERAL) 20 MG tablet TAKE 1 TABLET BY MOUTH THREE TIMES A DAY 07/07/22  Yes Nahser, Deloris Ping, MD  Respiratory Therapy Supplies (NEBULIZER/TUBING/MOUTHPIECE) KIT Use as directed 11/15/19  Yes Kalman Shan, MD  traZODone (DESYREL) 50 MG tablet Take 50-100 mg by mouth at bedtime. 10/01/21  Yes [provider]  ziprasidone (GEODON) 60 MG capsule Take 60 mg by mouth daily. 05/22/20  Yes [provider]   Social History   Socioeconomic History   Marital status: Single    Spouse name: Not on file   Number of children: 0   Years of education: Not on file   Highest education level: 9th grade  Occupational History   Not on file  Tobacco Use   Smoking status: Every Day    Packs/day: 1.00    Years: 20.00    Additional pack years: 0.00    Total pack years: 20.00    Types: Cigarettes   Smokeless tobacco: Never   Tobacco comments:    Smoking a pack per day.  03/11/2022 hfb  Vaping Use   Vaping Use: Never used  Substance and Sexual Activity   Alcohol use: No    Comment: 6 months sober   Drug use: No   Sexual activity: Not Currently    Partners: Male    Birth control/protection: Abstinence  Other Topics Concern   Not on file  Social History Narrative   Not on file   Social Determinants of Health   Financial Resource Strain: Low Risk  (10/02/2022)   Overall Financial Resource Strain (CARDIA)    Difficulty of Paying Living Expenses: Not hard at all  Food Insecurity: No Food Insecurity (10/02/2022)   Hunger Vital Sign    Worried About  Running Out of Food in the Last Year: Never true    Ran Out of Food in the Last Year: Never true  Transportation Needs: No Transportation Needs (10/02/2022)   PRAPARE - Administrator, Civil Service (Medical): No    Lack of Transportation (Non-Medical): No  Physical Activity: Inactive (10/02/2022)   Exercise Vital Sign    Days of Exercise per Week: 0 days    Minutes of Exercise per Session: 0 min  Stress: No Stress Concern Present (10/02/2022)   [employer] of Occupational Health - Occupational Stress Questionnaire    Feeling of Stress : Not at all  Social Connections: Socially Isolated (10/02/2022)   Social Connection and Isolation Panel [NHANES]    Frequency of Communication with Friends and Family: Once a week    Frequency of Social Gatherings with Friends and Family: Once a week    Attends Religious Services: Never    Database administrator or Organizations: No    Attends Banker Meetings: Never    Marital Status: Never married  Intimate Partner Violence: Not At Risk (09/24/2022)   Humiliation, Afraid, Rape, and Kick questionnaire    Fear of Current or Ex-Partner: No    Emotionally Abused: No    Physically Abused: No    Sexually Abused: No    Review of Systems Per HPI.   Objective:   Vitals:   10/02/22 1350  BP: 118/62  Pulse: 85  Temp: 98.4 F (36.9 C)  TempSrc: Temporal  SpO2: 94%  Weight: 196 lb 9.6 oz (89.2 kg)  Height: 5\' 3"  (1.6 m)     Physical Exam Vitals reviewed.  Constitutional:      General: She is not in acute distress.    Appearance: She is well-developed.  HENT:     Head: Normocephalic and atraumatic.     Right Ear: Hearing, tympanic membrane, ear canal and external ear normal.     Left Ear: Hearing, tympanic membrane, ear canal and external ear normal.     Nose: Nose normal.     Mouth/Throat:     Pharynx: No posterior oropharyngeal erythema.  Eyes:     Conjunctiva/sclera: Conjunctivae normal.     Pupils: Pupils  are equal, round, and reactive to light.  Cardiovascular:     Rate and Rhythm: Normal rate and regular rhythm.     Heart sounds: Normal heart sounds. No murmur heard.    Comments: Regular rate, no ectopy. Pulmonary:     Effort: Pulmonary effort is normal. No respiratory distress.     Breath sounds: No wheezing or rhonchi.     Comments: Overall clear, faint coarse breath sounds right lower lobe, clears on repeat exam.  Normal voice.  No stridor or wheeze appreciated. Skin:    General: Skin is warm and dry.     Findings: No rash.  Neurological:     Mental Status: She is alert and oriented to person, place, and time.  Psychiatric:        Mood and Affect: Mood normal.        Behavior: Behavior normal.      EKG reviewed, sinus rhythm, rate 78.  Nonspecific T wave abnormality in lead IRI, but unchanged from comparison EKG on 05/23/2022.  Contiguous leads appear stable.  No acute findings or significant changes from comparison EKG.   Assessment & Plan:  MARLISHA VANWYK is a 42 y.o. female . Palpitations - Plan: EKG 12-Lead, Basic metabolic panel, CBC with Differential/Platelet, DG Chest 2 View  -Asymptomatic in office with findings on EKG.  Recent TSH reassuring.,  Recent increased use of albuterol likely contributing.  Cut back on caffeine, cough improved with less perceived need for albuterol.  Check CBC and BMP.  RTC precautions if persistent but would recommend cardiology eval if  recurrent.  ER precautions given.    Subacute cough - Plan: DG Chest 2 View  -Chest x-ray without acute findings, likely bronchitic or asthma changes.  Cough, voice changes have improved.  Possible reactive airway from change in living situation versus viral syndrome hoarse voice versus component of reflux.  Symptoms improving.  Held on med changes at this time other than option of Pepcid at night if persistent breakthrough heartburn.  RTC precautions if not continuing to improve.  Continue Incruse Ellipta, chronic  asthma meds.  Leukocytosis, unspecified type - Plan: CBC with Differential/Platelet Afebrile.  Repeat CBC, RTC precautions if fever or acute illness symptoms.  Above symptoms improving.  RTC precautions.  No orders of the defined types were placed in this encounter.  Patient Instructions  Recent cough could have been from environmental exposure with new location, or flare of asthma, previous lung issues, heartburn flare, or virus.  Either way as it is improving I do not think any antibiotics are needed right now unless any concerns on your x-ray.  See location for that below.  Continue heartburn medication in the morning, and add Pepcid at bedtime if persistent heartburn flares. Be seen if any worsening cough, fever, shortness of breath or other new/worsening symptoms in the next few days.   [facility] or xray: Walk in 8:30-4:30 during weekdays, no appointment needed [address]  [place], [place] [address]   See below on information for palpitations.  That can occur with increased caffeine, as well as increased use of albuterol.  As her symptoms are improving and less need for albuterol, palpitations should also improve but I recommend cutting back on caffeine, make sure to drink plenty of fluids.  If heart palpitations do not resolve in the next day or 2, let us know we can refer you to cardiology.  If any worsening symptoms or new symptoms with those heart palpitations be seen through urgent care or emergency room as we discussed.  I am checking some blood work again today and we will let you know those results.  Return to the clinic or go to the nearest emergency room if any of your symptoms worsen or new symptoms occur.  Palpitations Palpitations are feelings that your heartbeat is irregular or is faster than normal. It may feel like your heart is fluttering or skipping a beat. Palpitations may be caused by many things, including smoking, caffeine, alcohol, stress, and certain medicines  or drugs. Most causes of palpitations are not serious.  However, some palpitations can be a sign of a serious problem. Further tests and a thorough medical history will be done to find the cause of your palpitations. Your provider may order tests such as an ECG, labs, an echocardiogram, or an ambulatory continuous ECG monitor. Follow these instructions at home: Pay attention to any changes in your symptoms. Let your health care provider know about them. Take these actions to help manage your symptoms: Eating and drinking Follow instructions from your health care provider about eating or drinking restrictions. You may need to avoid foods and drinks that may cause palpitations. These may include: Caffeinated coffee, tea, soft drinks, and energy drinks. Chocolate. Alcohol. Diet pills. Lifestyle     Take steps to reduce your stress and anxiety. Things that can help you relax include: Yoga. Mind-body activities, such as deep breathing, meditation, or using words and images to create positive thoughts (guided imagery). Physical activity, such as swimming, jogging, or walking. Tell your health care provider if your  palpitations increase with activity. If you have chest pain or shortness of breath with activity, do not continue the activity until you are seen by your health care provider. Biofeedback. This is a method that helps you learn to use your mind to control things in your body, such as your heartbeat. Get plenty of rest and sleep. Keep a regular bed time. Do not use drugs, including cocaine or ecstasy. Do not use marijuana. Do not use any products that contain nicotine or tobacco. These products include cigarettes, chewing tobacco, and vaping devices, such as e-cigarettes. If you need help quitting, ask your health care provider. General instructions Take over-the-counter and prescription medicines only as told by your health care provider. Keep all follow-up visits. This is important. These  may include visits for further testing if palpitations do not go away or get worse. Contact a health care provider if: You continue to have a fast or irregular heartbeat for a long period of time. You notice that your palpitations occur more often. Get help right away if: You have chest pain or shortness of breath. You have a severe headache. You feel dizzy or you faint. These symptoms may represent a serious problem that is an emergency. Do not wait to see if the symptoms will go away. Get medical help right away. Call your local emergency services (911 in the [place] Do not drive yourself to the hospital. Summary Palpitations are feelings that your heartbeat is irregular or is faster than normal. It may feel like your heart is fluttering or skipping a beat. Palpitations may be caused by many things, including smoking, caffeine, alcohol, stress, certain medicines, and drugs. Further tests and a thorough medical history may be done to find the cause of your palpitations. Get help right away if you faint or have chest pain, shortness of breath, severe headache, or dizziness. This information is not intended to replace advice given to you by your health care provider. Make sure you discuss any questions you have with your health care provider. Document Revised: 08/29/2020 Document Reviewed: 08/29/2020 Elsevier Patient Education  2024 Elsevier Inc.    Cough, Adult Coughing is a reflex that clears your throat and airways (respiratory system). It helps heal and protect your lungs. It is normal to cough from time to time. A cough that happens with other symptoms or that lasts a long time may be a sign of a condition that needs treatment. A short-term (acute) cough may only last 2-3 weeks. A long-term (chronic) cough may last 8 or more weeks. Coughing is often caused by: Diseases, such as: An infection of the respiratory system. Asthma or other heart or lung diseases. Gastroesophageal reflux. This  is when acid comes back up from the stomach. Breathing in things that irritate your lungs. Allergies. Postnasal drip. This is when mucus runs down the back of your throat. Smoking. Some medicines. Follow these instructions at home: Medicines Take over-the-counter and prescription medicines only as told by your health care provider. Talk with your provider before you take cough medicine (cough suppressants). Eating and drinking Do not drink alcohol. Avoid caffeine. Drink enough fluid to keep your pee (urine) pale yellow. Lifestyle Avoid cigarette smoke. Do not use any products that contain nicotine or tobacco. These products include cigarettes, chewing tobacco, and vaping devices, such as e-cigarettes. If you need help quitting, ask your provider. Avoid things that make you cough. These may include perfumes, candles, cleaning products, or campfire smoke. General instructions  Watch for any changes  to your cough. Tell your provider about them. Always cover your mouth when you cough. If the air is dry in your bedroom or home, use a cool mist vaporizer or humidifier. If your cough is worse at night, try to sleep in a semi-upright position. Rest as needed. Contact a health care provider if: You have new symptoms, or your symptoms get worse. You cough up pus. You have a fever that does not go away or a cough that does not get better after 2-3 weeks. You cannot control your cough with medicine, and you are losing sleep. You have pain that gets worse or is not helped with medicine. You lose weight for no clear reason. You have night sweats. Get help right away if: You cough up blood. You have trouble breathing. Your heart is beating very fast. These symptoms may be an emergency. Get help right away. Call 911. Do not wait to see if the symptoms will go away. Do not drive yourself to the hospital. This information is not intended to replace advice given to you by your health care  provider. Make sure you discuss any questions you have with your health care provider. Document Revised: 12/06/2021 Document Reviewed: 12/06/2021 Elsevier Patient Education  2024 Elsevier Inc.     Signed,   Meredith Staggers, MD [facility], [facility] 10/02/22 9:54 PM

## 2022-10-03 ENCOUNTER — Ambulatory Visit (HOSPITAL_COMMUNITY)
Admission: RE | Admit: 2022-10-03 | Discharge: 2022-10-03 | Disposition: A | Discharge status: Home / Self Care | Payer: Medicaid Other | Source: Ambulatory Visit | Attending: Adult Health | Admitting: Adult Health

## 2022-10-03 ENCOUNTER — Ambulatory Visit: Payer: Medicaid Other | Admitting: Emergency Medicine

## 2022-10-03 DIAGNOSIS — N83202 Unspecified ovarian cyst, left side: Secondary | ICD-10-CM | POA: Diagnosis present

## 2022-10-03 LAB — CBC WITH DIFFERENTIAL/PLATELET
Basophils Absolute: 0.1 10*3/uL (ref 0.0–0.1)
Basophils Relative: 1 % (ref 0.0–3.0)
Eosinophils Absolute: 0.3 10*3/uL (ref 0.0–0.7)
Eosinophils Relative: 2.2 % (ref 0.0–5.0)
HCT: 42.3 % (ref 36.0–46.0)
Hemoglobin: 13.9 g/dL (ref 12.0–15.0)
Lymphocytes Relative: 39.4 % (ref 12.0–46.0)
Lymphs Abs: 4.7 10*3/uL — ABNORMAL HIGH (ref 0.7–4.0)
MCHC: 32.9 g/dL (ref 30.0–36.0)
MCV: 115.2 fl — ABNORMAL HIGH (ref 78.0–100.0)
Monocytes Absolute: 0.4 10*3/uL (ref 0.1–1.0)
Monocytes Relative: 3.7 % (ref 3.0–12.0)
Neutro Abs: 6.4 10*3/uL (ref 1.4–7.7)
Neutrophils Relative %: 53.7 % (ref 43.0–77.0)
Platelets: 234 10*3/uL (ref 150.0–400.0)
RBC: 3.67 Mil/uL — ABNORMAL LOW (ref 3.87–5.11)
RDW: 14.5 % (ref 11.5–15.5)
WBC: 11.8 10*3/uL — ABNORMAL HIGH (ref 4.0–10.5)

## 2022-10-03 LAB — BASIC METABOLIC PANEL
BUN: 5 mg/dL — ABNORMAL LOW (ref 6–23)
CO2: 26 mEq/L (ref 19–32)
Calcium: 9 mg/dL (ref 8.4–10.5)
Chloride: 97 mEq/L (ref 96–112)
Creatinine, Ser: 0.71 mg/dL (ref 0.40–1.20)
GFR: 106.02 mL/min (ref >60.00)
Glucose, Bld: 246 mg/dL — ABNORMAL HIGH (ref 70–99)
Potassium: 4 mEq/L (ref 3.5–5.1)
Sodium: 130 mEq/L — ABNORMAL LOW (ref 135–145)

## 2022-10-06 ENCOUNTER — Encounter: Payer: Self-pay | Admitting: Family Medicine

## 2022-10-06 ENCOUNTER — Ambulatory Visit (HOSPITAL_COMMUNITY): Payer: Medicaid Other

## 2022-10-06 NOTE — Telephone Encounter (Signed)
I accidentally sent this result request to [facility], please review and advise

## 2022-10-06 NOTE — Telephone Encounter (Signed)
Pt is asking about results please advise?

## 2022-10-07 NOTE — Telephone Encounter (Signed)
Third times the charm I tried sending this to you yesterday and kept selecting the wrong provider

## 2022-10-07 NOTE — Telephone Encounter (Signed)
Results sent to patient via MyChart note today.

## 2022-10-09 ENCOUNTER — Encounter: Payer: Self-pay | Admitting: Family Medicine

## 2022-10-09 ENCOUNTER — Other Ambulatory Visit: Payer: Self-pay | Admitting: Adult Health

## 2022-10-09 MED ORDER — KETOROLAC TROMETHAMINE 10 MG PO TABS: 10.0000 mg | ORAL_TABLET | Freq: Three times a day (TID) | ORAL | 0 refills | Status: DC | PRN

## 2022-10-09 NOTE — Progress Notes (Signed)
Will rx toradol  

## 2022-10-10 ENCOUNTER — Encounter: Payer: Self-pay | Admitting: Family Medicine

## 2022-10-17 ENCOUNTER — Ambulatory Visit (HOSPITAL_COMMUNITY)
Admission: RE | Admit: 2022-10-17 | Discharge: 2022-10-17 | Disposition: A | Discharge status: Home / Self Care | Payer: Medicaid Other | Source: Ambulatory Visit | Attending: Adult Health | Admitting: Adult Health

## 2022-10-17 DIAGNOSIS — R918 Other nonspecific abnormal finding of lung field: Secondary | ICD-10-CM | POA: Diagnosis present

## 2022-10-17 DIAGNOSIS — R599 Enlarged lymph nodes, unspecified: Secondary | ICD-10-CM | POA: Diagnosis present

## 2022-10-20 ENCOUNTER — Ambulatory Visit: Payer: Medicaid Other | Admitting: Obstetrics & Gynecology

## 2022-10-20 NOTE — Telephone Encounter (Signed)
Patient checking on message for CT scan results. Does not needs refill. Patient phone number is [phone].

## 2022-10-21 ENCOUNTER — Ambulatory Visit (INDEPENDENT_AMBULATORY_CARE_PROVIDER_SITE_OTHER): Payer: MEDICAID | Admitting: Family Medicine

## 2022-10-21 ENCOUNTER — Encounter: Payer: Self-pay | Admitting: Family Medicine

## 2022-10-21 VITALS — BP 122/80 | HR 78 | Temp 98.7°F | Wt 195.2 lb

## 2022-10-21 DIAGNOSIS — N390 Urinary tract infection, site not specified: Secondary | ICD-10-CM | POA: Diagnosis not present

## 2022-10-21 DIAGNOSIS — R350 Frequency of micturition: Secondary | ICD-10-CM | POA: Diagnosis not present

## 2022-10-21 DIAGNOSIS — R319 Hematuria, unspecified: Secondary | ICD-10-CM

## 2022-10-21 LAB — POCT URINALYSIS DIPSTICK
Blood, UA: NEGATIVE
Glucose, UA: POSITIVE — AB
Ketones, UA: NEGATIVE
Nitrite, UA: NEGATIVE
Protein, UA: POSITIVE — AB
Spec Grav, UA: >=1.03 — AB (ref 1.010–1.025)
Urobilinogen, UA: 0.2 E.U./dL
pH, UA: 6 (ref 5.0–8.0)

## 2022-10-21 MED ORDER — NITROFURANTOIN MONOHYD MACRO 100 MG PO CAPS
100.0000 mg | ORAL_CAPSULE | Freq: Two times a day (BID) | ORAL | 0 refills | Status: AC
Start: 2022-10-21 — End: 2022-10-26

## 2022-10-21 NOTE — Patient Instructions (Addendum)
-  It was a pleasure to see you today. -Urinalysis showed an urinary tract infection. Will send urine for culture. Will notify you of results by phone call and you can see them on MyChart.  -Will start treatment with MACROBID 100mg  tablet, take 1 tablet twice a day for 5 days.  -If symptoms become worse, please go to the emergency department.  -Reschedule appointment on Friday to Monday. Will recheck urinalysis at that appointment.

## 2022-10-21 NOTE — Progress Notes (Signed)
Acute Office Visit   Subjective:  Patient ID: Emily [name], female    DOB: [birth year], 42 y.o.   MRN: [id number]  Chief Complaint  Patient presents with   Urinary Frequency    Pt is here today with C/O of blood in urine,urine frequency,pain in lower abdominal. OTC Tylenol Sx started 10/20/2022    Urinary Frequency  Associated symptoms include frequency.   Patient is complaining of lower back pain, increase in frequency and urgency, hematuria, and lower back pain.  Denies fever.   Symptoms started yesterday.   Took OTC Tylenol with no relief.   Review of Systems  Genitourinary:  Positive for frequency.   See HPI above      Objective:   BP 122/80   Pulse 78   Temp 98.7 F (37.1 C)   Wt 195 lb 3.2 oz (88.5 kg)   SpO2 97%   BMI 34.58 kg/m    Physical Exam Vitals reviewed.  Constitutional:      General: She is not in acute distress.    Appearance: Normal appearance. She is obese. She is not ill-appearing, toxic-appearing or diaphoretic.  Eyes:     General:        Right eye: No discharge.        Left eye: No discharge.     Conjunctiva/sclera: Conjunctivae normal.  Cardiovascular:     Rate and Rhythm: Normal rate and regular rhythm.     Heart sounds: Normal heart sounds. No murmur heard.    No friction rub. No gallop.  Pulmonary:     Effort: Pulmonary effort is normal. No respiratory distress.     Breath sounds: Normal breath sounds.  Abdominal:     General: Bowel sounds are normal. There is no distension.     Palpations: Abdomen is soft.     Tenderness: There is abdominal tenderness. There is right CVA tenderness. There is no left CVA tenderness.  Musculoskeletal:        General: Normal range of motion.  Skin:    General: Skin is warm and dry.  Neurological:     General: No focal deficit present.     Mental Status: She is alert and oriented to person, place, and time. Mental status is at baseline.  Psychiatric:        Mood and Affect: Mood normal.         Behavior: Behavior normal.        Thought Content: Thought content normal.        Judgment: Judgment normal.    Results for orders placed or performed in visit on 10/21/22  POCT urinalysis dipstick  Result Value Ref Range   Color, UA DARK    Clarity, UA     Glucose, UA Positive (A) Negative   Bilirubin, UA postive    Ketones, UA neg    Spec Grav, UA >=1.030 (A) 1.010 - 1.025   Blood, UA neg    pH, UA 6.0 5.0 - 8.0   Protein, UA Positive (A) Negative   Urobilinogen, UA 0.2 0.2 or 1.0 E.U./dL   Nitrite, UA NEG    Leukocytes, UA Moderate (2+) (A) Negative   Appearance     Odor        Assessment & Plan:  Urinary tract infection with hematuria, site unspecified -     Urine Culture -     Nitrofurantoin Monohyd Macro; Take 1 capsule (100 mg total) by mouth 2 (two) times daily for 5 days.  Dispense:  10 capsule; Refill: 0  Urine frequency -     POCT urinalysis dipstick  -Urinalysis showed an urinary tract infection. Will send urine for culture. Will notify patient of results by phone call and can see results on MyChart. Depending on culture results, may need to change antibiotic or send to the emergency department because of several allergies.  -Will start treatment with MACROBID 100mg  tablet, take 1 tablet twice a day for 5 days.  -If symptoms become worse, please go to the emergency department.  -Reschedule appointment on Friday to Monday. Will recheck urinalysis at that appointment.   Return for Please change Fridays appointment to a Monday appointment .  Zandra Abts, NP

## 2022-10-22 NOTE — Telephone Encounter (Signed)
Patient checking on message for CT results. Patient phone number is [phone].

## 2022-10-23 LAB — URINE CULTURE
MICRO NUMBER:: 15153969
Result:: NO GROWTH
SPECIMEN QUALITY:: ADEQUATE

## 2022-10-24 ENCOUNTER — Encounter: Payer: Self-pay | Admitting: Family Medicine

## 2022-10-24 ENCOUNTER — Ambulatory Visit: Payer: Medicaid Other | Admitting: Family Medicine

## 2022-10-24 ENCOUNTER — Other Ambulatory Visit: Payer: Self-pay | Admitting: *Deleted

## 2022-10-24 ENCOUNTER — Telehealth: Payer: Self-pay

## 2022-10-24 ENCOUNTER — Telehealth: Payer: Self-pay | Admitting: General Practice

## 2022-10-24 DIAGNOSIS — E559 Vitamin D deficiency, unspecified: Secondary | ICD-10-CM

## 2022-10-24 DIAGNOSIS — E039 Hypothyroidism, unspecified: Secondary | ICD-10-CM

## 2022-10-24 DIAGNOSIS — E118 Type 2 diabetes mellitus with unspecified complications: Secondary | ICD-10-CM

## 2022-10-24 NOTE — Telephone Encounter (Signed)
D/W Edd Fabian, FNP-C discuss with Dr Colletta Maryland or at f/u with Dr Elease Hashimoto. Pt has refused the ordered CT's that were ordered. Pt informed of providers result & recommendations. Pt verbalized understanding. All questions, if any, were answered.

## 2022-10-24 NOTE — Telephone Encounter (Signed)
Patient returned Mackenzie's call and I let her know that her urine culture showed no growth. Patient states that she is feeling better and she did send a my chart message stating that

## 2022-10-24 NOTE — Telephone Encounter (Signed)
Patient was trying to speak with Dr. Elease Hashimoto regarding her CT she had last week.   Per mychart message advised to f/u with Nahser.

## 2022-10-24 NOTE — Telephone Encounter (Signed)
The was no growth on the patients urine Culture Emily [name] would like to know how she is doing today compared to when she was seen.

## 2022-10-24 NOTE — Telephone Encounter (Signed)
Pt is requesting a callback regarding her CT results she recently had done last week. Please advise

## 2022-10-24 NOTE — Progress Notes (Signed)
Called and spoke with patient, advised of results/recommendations per [name] Parrett NP.  She verbalized understanding.  Nothing further needed.

## 2022-10-24 NOTE — Telephone Encounter (Signed)
-----   Message from Alveria Apley, NP sent at 10/24/2022  8:59 AM EDT ----- There was no growth on your urine culture. How are you feeling?

## 2022-10-27 ENCOUNTER — Ambulatory Visit (INDEPENDENT_AMBULATORY_CARE_PROVIDER_SITE_OTHER): Payer: MEDICAID | Admitting: Family Medicine

## 2022-10-27 VITALS — BP 128/82 | HR 95 | Temp 98.1°F | Ht 63.0 in | Wt 199.2 lb

## 2022-10-27 DIAGNOSIS — R200 Anesthesia of skin: Secondary | ICD-10-CM

## 2022-10-27 DIAGNOSIS — R202 Paresthesia of skin: Secondary | ICD-10-CM

## 2022-10-27 DIAGNOSIS — N898 Other specified noninflammatory disorders of vagina: Secondary | ICD-10-CM | POA: Diagnosis not present

## 2022-10-27 DIAGNOSIS — E878 Other disorders of electrolyte and fluid balance, not elsewhere classified: Secondary | ICD-10-CM | POA: Diagnosis not present

## 2022-10-27 DIAGNOSIS — N390 Urinary tract infection, site not specified: Secondary | ICD-10-CM | POA: Diagnosis not present

## 2022-10-27 DIAGNOSIS — R319 Hematuria, unspecified: Secondary | ICD-10-CM | POA: Diagnosis not present

## 2022-10-27 DIAGNOSIS — E118 Type 2 diabetes mellitus with unspecified complications: Secondary | ICD-10-CM | POA: Diagnosis not present

## 2022-10-27 LAB — POCT URINALYSIS DIPSTICK
Bilirubin, UA: NEGATIVE
Glucose, UA: POSITIVE — AB
Ketones, UA: NEGATIVE
Nitrite, UA: NEGATIVE
Protein, UA: NEGATIVE
Spec Grav, UA: 1.01 (ref 1.010–1.025)
Urobilinogen, UA: 0.2 E.U./dL
pH, UA: 6 (ref 5.0–8.0)

## 2022-10-27 MED ORDER — TIRZEPATIDE 2.5 MG/0.5ML ~~LOC~~ SOAJ
2.5000 mg | SUBCUTANEOUS | 0 refills | Status: DC
Start: 2022-10-27 — End: 2022-12-01

## 2022-10-27 NOTE — Patient Instructions (Addendum)
-  Urinalysis shows blood and leukocytes. Will send urine for culture. Last culture was negative for any growth and you completed a cycle of antibiotic. If urine culture is negative, then will recommend a referral to urology.  -Ordered testing trich, chlamydia, bacterial vaginosis, and yeast. Office will call with results and you may see them on MyChart. Will treat accordingly to results.  -START Mounjaro 2.5mg  per weekly injection. Discussed about side effects and reactions to other diabetic medications. Advised patient to call office if she can not get medication or having complications with medication. Follow up in 1 month. -Ordered labs for new onset of numbness and tingling in lower extremities. If vitamin B12 is normal. Will prescribed Gabapentin. Ordered CMP to assess electrolyte abnormalities.  -Follow up in 1 month.

## 2022-10-27 NOTE — Progress Notes (Signed)
Established Patient Office Visit   Subjective:  Patient ID: Emily [name], female    DOB: 1980/10/31  Age: 42 y.o. MRN: [id number]  Chief Complaint  Patient presents with   Chronic Care Management    Pt is here for Chronic mgnt. Pt reports brown odor discharge. 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 However,  urinalysis culture came with no growth. She reports she did take all of the Macrobid thought.  Will recheck urinalysis today and send for culture. Denies being on menstrual cycle. Also, she is complaining of a brown odor discharge. Thick. Denies any intercourse in 3 years. Lower abd pain, some nausea with no vomiting. denies lower back pain, fever, blood in stools, and melena.   Diabetes: Chronic. Patient is taking Humalog 10U TID. Prior to previous visit, patient was not taking any medication for diabetes. She is awaiting for an endocrinology appointment on 01/16/2023. She reports her concern is gaining weight with taking insulin. She reports she is checking her blood sugar before eating, running in the 300s, after eating and taking Humalog, blood sugar will decrease. She reports she has previously tried Metformin-caused diarrhea, Ozempic-caused headache, and Farxiga-caused vaginal itching. Wt Readings from Last 3 Encounters:  10/27/22 199 lb 4 oz (90.4 kg)  10/21/22 195 lb 3.2 oz (88.5 kg)  10/02/22 196 lb 9.6 oz (89.2 kg)    Patient is complaining of new onset of numbness and tingling in both feet. She reports she has started to feel sharp intermittent sharp pains. Usually pain occurs at night time.  ROS See HPI above     Objective:   BP 128/82   Pulse 95   Temp 98.1 F (36.7 C)   Ht 5\' 3"  (1.6 m)   Wt 199 lb 4 oz (90.4 kg)   SpO2 95%   BMI 35.30 kg/m    Physical Exam Vitals  reviewed.  Constitutional:      General: She is not in acute distress.    Appearance: Normal appearance. She is obese. She is not ill-appearing, toxic-appearing or diaphoretic.  HENT:     Head: Normocephalic and atraumatic.  Eyes:     General:        Right eye: No discharge.        Left eye: No discharge.     Conjunctiva/sclera: Conjunctivae normal.  Cardiovascular:     Rate and Rhythm: Normal rate and regular rhythm.     Pulses:          Dorsalis pedis pulses are 3+ on the right side and 3+ on the left side.     Heart sounds: Normal heart sounds. No murmur heard.    No friction rub. No gallop.  Pulmonary:     Effort: Pulmonary effort is normal. No respiratory distress.     Breath sounds: Normal breath sounds.  Musculoskeletal:        General: Normal range of motion.     Right lower leg: Edema (Trace) present.     Left lower leg: Edema (Trace) present.  Skin:    General: Skin is warm and dry.  Neurological:     General: No focal deficit present.     Mental Status: She is alert and oriented to person, place, and time. Mental status is at baseline.  Psychiatric:        Mood and Affect: Mood normal.        Behavior: Behavior normal.        Thought Content: Thought content normal.        Judgment: Judgment normal.    Results for orders placed or performed in visit on 10/27/22  POCT urinalysis dipstick  Result Value Ref Range   Color, UA     Clarity, UA     Glucose, UA Positive (A) Negative   Bilirubin, UA NEG    Ketones, UA NEG    Spec Grav, UA 1.010 1.010 - 1.025   Blood, UA 3+    pH, UA 6.0 5.0 - 8.0   Protein, UA Negative Negative   Urobilinogen, UA 0.2 0.2 or 1.0 E.U./dL   Nitrite, UA NEG    Leukocytes, UA Large (3+) (A) Negative   Appearance     Odor        Assessment & Plan:  Type 2 diabetes mellitus with complication, without long-term current use of insulin (HCC) Assessment & Plan: Had a discussion about diabetic medications and her symptoms from  previous diabetic medications. Through shared decision making, decided to start [facility] 2.5mg  per weekly injection. Discussed about hypoglycemia episodes with taking Mounjaro and remaining on Humalog. Eventually if patient can tolerate Mounjaro, would discontinue Humalog. Will follow up in 1 month to see how she is doing with starting new medication since it will be another 2 months before she can be evaluated by endocrinology. Advised patient to call if she can afford medication or not able to obtain medication with insurance. Will prescribe another medication if not able to obtain.     Orders: -     Tirzepatide; Inject 2.5 mg into the skin once a week.  Dispense: 2 mL; Refill: 0  Urinary tract infection with hematuria, site unspecified -     POCT urinalysis dipstick  Vaginal discharge -     Urine Culture -     NuSwab Vaginitis Plus (VG+)  Numbness and tingling of both feet -     Vitamin B12  Electrolyte abnormality -     Comprehensive metabolic panel  1.Urinalysis shows blood and leukocytes. Will send urine for culture. Last culture was negative for any growth and she completed a cycle of antibiotic. If urine culture is negative, then will recommend a referral to urology.  2.Ordered testing trich, chlamydia, bacterial vaginosis, and yeast for a reason of vaginal discharge. Will treat accordingly to results.  3.Ordered labs for new onset of numbness and tingling in lower extremities. If vitamin B12 is normal. Will prescribed Gabapentin 100mg  at bedtime.  4. Ordered CMP to assess electrolyte abnormalities. Sodium was 130 (low);   Return in about 1 month (around 11/27/2022) for follow-up.   Zandra Abts, NP

## 2022-10-28 ENCOUNTER — Other Ambulatory Visit: Payer: Self-pay | Admitting: *Deleted

## 2022-10-28 ENCOUNTER — Telehealth: Payer: Self-pay

## 2022-10-28 DIAGNOSIS — R918 Other nonspecific abnormal finding of lung field: Secondary | ICD-10-CM

## 2022-10-28 LAB — COMPREHENSIVE METABOLIC PANEL
ALT: 37 U/L — ABNORMAL HIGH (ref 0–35)
AST: 52 U/L — ABNORMAL HIGH (ref 0–37)
Albumin: 3.9 g/dL (ref 3.5–5.2)
Alkaline Phosphatase: 146 U/L — ABNORMAL HIGH (ref 39–117)
BUN: 5 mg/dL — ABNORMAL LOW (ref 6–23)
CO2: 26 mEq/L (ref 19–32)
Calcium: 9 mg/dL (ref 8.4–10.5)
Chloride: 95 mEq/L — ABNORMAL LOW (ref 96–112)
Creatinine, Ser: 0.65 mg/dL (ref 0.40–1.20)
GFR: 109.73 mL/min (ref >60.00)
Glucose, Bld: 334 mg/dL — ABNORMAL HIGH (ref 70–99)
Potassium: 4.1 mEq/L (ref 3.5–5.1)
Sodium: 128 mEq/L — ABNORMAL LOW (ref 135–145)
Total Bilirubin: 0.8 mg/dL (ref 0.2–1.2)
Total Protein: 7.4 g/dL (ref 6.0–8.3)

## 2022-10-28 LAB — VITAMIN B12: Vitamin B-12: 291 pg/mL (ref 211–911)

## 2022-10-28 NOTE — Assessment & Plan Note (Addendum)
Had a discussion about diabetic medications and her symptoms from previous diabetic medications. Through shared decision making, decided to start [facility] 2.5mg  per weekly injection. Discussed about hypoglycemia episodes with taking Mounjaro and remaining on Humalog. Eventually if patient can tolerate Mounjaro, would discontinue Humalog. Will follow up in 1 month to see how she is doing with starting new medication since it will be another 2 months before she can be evaluated by endocrinology. Advised patient to call if she can afford medication or not able to obtain medication with insurance. Will prescribe another medication if not able to obtain.

## 2022-10-28 NOTE — Telephone Encounter (Signed)
PT is aware of lab results.  

## 2022-10-28 NOTE — Telephone Encounter (Signed)
-----   Message from Alveria Apley, NP sent at 10/28/2022  1:44 PM EDT ----- Sodium is even lower, more likely related to blood sugars being elevated. Hopefully with the start of Mounjaro, blood sugars and sodium will improve. Will recheck sodium level at next visit. Liver enzymes are about the same as previous values a month a month ago.

## 2022-10-29 ENCOUNTER — Telehealth: Payer: Self-pay

## 2022-10-29 ENCOUNTER — Other Ambulatory Visit: Payer: Self-pay

## 2022-10-29 LAB — NUSWAB VAGINITIS PLUS (VG+)
BVAB 2: HIGH Score — AB
Candida albicans, NAA: NEGATIVE
Candida glabrata, NAA: NEGATIVE
Chlamydia trachomatis, NAA: NEGATIVE
Megasphaera 1: HIGH Score — AB
Neisseria gonorrhoeae, NAA: NEGATIVE
Trich vag by NAA: NEGATIVE

## 2022-10-29 LAB — URINE CULTURE
MICRO NUMBER:: 15171294
SPECIMEN QUALITY:: ADEQUATE

## 2022-10-29 MED ORDER — TINIDAZOLE 500 MG PO TABS: ORAL_TABLET | ORAL | 0 refills | Status: DC

## 2022-10-29 NOTE — Telephone Encounter (Signed)
-----   Message from Alveria Apley, NP sent at 10/29/2022  8:25 AM EDT ----- Your vaginal swab came back positive with bacterial vaginosis. Recommend Tinidazole 500mg  tablet, take 4 tablets for 5 days. Recommend to take with food.  Urine culture came back with no need for further treatment. Will treat bacterial vaginosis and recheck urine at next appointment to see if abnormal findings were related to bacterial vaginosis.

## 2022-10-29 NOTE — Telephone Encounter (Signed)
PCCs, can we cancel the CT that is scheduled for 10/31/22? TP did not want the CT to be done until next year. "Schedule in 1 year" was placed in the scheduling instructions.

## 2022-10-29 NOTE — Telephone Encounter (Signed)
Pt called back and we went over her lab resutls . She expressed verbal understanding .  Tinidazole 500  mg has been sent to pharmacy

## 2022-10-30 ENCOUNTER — Encounter: Payer: Self-pay | Admitting: Family Medicine

## 2022-10-31 ENCOUNTER — Other Ambulatory Visit: Payer: MEDICAID

## 2022-11-03 ENCOUNTER — Telehealth: Payer: Self-pay

## 2022-11-03 ENCOUNTER — Encounter: Payer: Self-pay | Admitting: Family Medicine

## 2022-11-03 NOTE — Telephone Encounter (Signed)
PA request has been Submitted. New Encounter created for follow up. For additional info see Pharmacy Prior Auth telephone encounter from 11/03/22.

## 2022-11-03 NOTE — Telephone Encounter (Signed)
Error

## 2022-11-03 NOTE — Telephone Encounter (Signed)
Pharmacy Patient Advocate Encounter   Received notification from Pt Calls Messages that prior authorization for Mounjaro 2.5MG /0.5ML pen-injectors is required/requested.   Insurance verification completed.   The patient is insured through  [employer]  .   Per test claim: PA submitted to [employer] via CoverMyMeds Key/confirmation #/EOC  [id number] Status is pending

## 2022-11-04 ENCOUNTER — Telehealth: Payer: Self-pay | Admitting: Internal Medicine

## 2022-11-05 NOTE — Telephone Encounter (Signed)
PA denied due to missing information. Resubmitted PA to [employer] with additional information. New Key: [id number]. Status pending.

## 2022-11-06 NOTE — Progress Notes (Signed)
ATC x1.  LVM to return call. 

## 2022-11-06 NOTE — Telephone Encounter (Signed)
Gave info to pt

## 2022-11-06 NOTE — Telephone Encounter (Signed)
Pharmacy Patient Advocate Encounter  Received notification from  [employer]  that Prior Authorization for Mounjaro 2.5MG /0.5ML pen-injectorshas been DENIED because  Records show you have not tried and failed at least two (2) preferred drugs from this drug class or group of drugs. Preferred drugs for this plan are Byetta pen, Trulicity pen, Victoza pen, Ozempic pen (tried)...  Patient has tried 2 preferred drugs. Called insurance at [phone] to initiate appeal. Urgent appeal submitted. Turnaround time is 72 hours.   PA #/Case ID/Reference #: [id number]

## 2022-11-06 NOTE — Telephone Encounter (Signed)
Patient had CT chest without contrast 07/10/22 at [facility]. CT report in care everywhere and image has been requested to be uploaded in PACS. Patient also has had CT chest 10/17/22 and results are in epic.  Patient needs to keep 1 year CT chest (10/2023) ordered by [name],NP as ordered 10/28/22. Patient does not need additional CT chest in September.

## 2022-11-07 ENCOUNTER — Other Ambulatory Visit (HOSPITAL_COMMUNITY): Payer: Self-pay

## 2022-11-07 ENCOUNTER — Telehealth: Payer: Self-pay

## 2022-11-07 NOTE — Telephone Encounter (Signed)
Left vm to call office

## 2022-11-07 NOTE — Telephone Encounter (Signed)
Pharmacy Patient Advocate Encounter  Received notification from  [employer]  that APPEAL for Mounjaro 2.5MG /0.5ML  has been APPROVED from 11/07/22 to ---.Marland Kitchen  PA #/Case ID/Reference #: [id number]  Copay is $4  Approval letter attached to charts

## 2022-11-07 NOTE — Telephone Encounter (Signed)
Pharmacy Patient Advocate Encounter   Received notification from CoverMyMeds that prior authorization for Tinidazole 500mg  is required/requested.   Insurance verification completed.   The patient is insured through  [employer]  .   Per test claim: PA submitted to [employer] via CoverMyMeds Key/confirmation #/EOC [id number] Status is pending

## 2022-11-07 NOTE — Telephone Encounter (Signed)
Patient returned phone call and patient is aware.

## 2022-11-07 NOTE — Telephone Encounter (Signed)
Called to let patient know about approval, no answer LM to call back

## 2022-11-10 ENCOUNTER — Other Ambulatory Visit (HOSPITAL_COMMUNITY): Payer: Self-pay

## 2022-11-10 NOTE — Telephone Encounter (Signed)
Pt is aware and she is going to [facility] today

## 2022-11-10 NOTE — Telephone Encounter (Signed)
Called to inform pt, no answer, LM to call back

## 2022-11-10 NOTE — Telephone Encounter (Signed)
Pharmacy Patient Advocate Encounter  Received notification from  [facility] [employer]  that Prior Authorization for Tinidazol 500mg  has been APPROVED from 11/08/22 to 11/07/23.Marland Kitchen  PA #/Case ID/Reference #: [id number]   Left a voicemail with [facility] to notify of the approval.   Approval letter indexed to media tab.

## 2022-11-13 ENCOUNTER — Encounter: Payer: Self-pay | Admitting: *Deleted

## 2022-11-13 ENCOUNTER — Telehealth: Payer: Self-pay | Admitting: Family Medicine

## 2022-11-13 NOTE — Telephone Encounter (Signed)
Patient states her Greggory Keen is making her sick- vomit, stomach pain, and not able to eat  Please advise

## 2022-11-17 ENCOUNTER — Telehealth: Payer: Self-pay | Admitting: Internal Medicine

## 2022-11-17 NOTE — Telephone Encounter (Signed)
PT reached out vis [facility]. States she is coughing up stuff. I tried to call to make acute appt w/Dr. Francine Graven and had to leave a VM. Please try to call @ [phone]

## 2022-11-17 NOTE — Telephone Encounter (Signed)
Called and spoke with patient.  Patient stated she started having a productive cough with light green sputum.  Patient stated cough started at the end of last week and she denies any other symptoms. Patient is requesting antibiotic be sent to [facility]. Patient has scheduled follow up with Dr. Marchelle Gearing 12/12/22 for follow up.  Message routed to Dr. Tonia Brooms to Advise- Dr. Marchelle Gearing is unavailable today

## 2022-11-17 NOTE — Telephone Encounter (Signed)
Called and spoke with patient.  Dr. Myrlene Broker recommendations given.  Understanding stated.  Patient scheduled 11/20/22 at 1330 with Katie,NP for acute visit.  Nothing further at this time.

## 2022-11-18 ENCOUNTER — Ambulatory Visit: Payer: MEDICAID | Admitting: Obstetrics & Gynecology

## 2022-11-18 ENCOUNTER — Ambulatory Visit: Payer: MEDICAID | Admitting: Family Medicine

## 2022-11-18 ENCOUNTER — Encounter: Payer: Self-pay | Admitting: Obstetrics & Gynecology

## 2022-11-18 VITALS — BP 117/83 | HR 82 | Ht 63.0 in | Wt 197.0 lb

## 2022-11-18 DIAGNOSIS — N6081 Other benign mammary dysplasias of right breast: Secondary | ICD-10-CM | POA: Diagnosis not present

## 2022-11-18 NOTE — Telephone Encounter (Signed)
This has been addressed in phone message form 7/29. Patient advised to have appt or call pcp for appt.

## 2022-11-18 NOTE — Telephone Encounter (Signed)
NOTED

## 2022-11-18 NOTE — Progress Notes (Signed)
Chief Complaint  Patient presents with   Ovarian Cyst   Breast Mass      42 y.o. [id number] Patient's last menstrual period was 10/06/2022. The current method of family planning is abstinent.  Outpatient Encounter Medications as of 11/18/2022  Medication Sig   ACCU-CHEK AVIVA PLUS test strip 1 each by Other route 3 (three) times daily.   ACCU-CHEK FASTCLIX LANCETS MISC USE AS DIRECTED   albuterol (VENTOLIN HFA) 108 (90 Base) MCG/ACT inhaler Inhale 2 puffs into the lungs every 6 (six) hours as needed for wheezing or shortness of breath.   amitriptyline (ELAVIL) 25 MG tablet Take 50 mg by mouth at bedtime.   amLODipine (NORVASC) 5 MG tablet Take 1 tablet (5 mg total) by mouth daily.   atorvastatin (LIPITOR) 40 MG tablet Take 40 mg by mouth daily.   benzonatate (TESSALON) 200 MG capsule Take 1 capsule (200 mg total) by mouth 3 (three) times daily as needed.   Blood Glucose Monitoring Suppl (ACCU-CHEK AVIVA PLUS) w/Device KIT USE AS DIRECTED   clonazePAM (KLONOPIN) 0.5 MG disintegrating tablet Take 0.5 mg by mouth daily as needed.   fluticasone-salmeterol (ADVAIR) 250-50 MCG/ACT AEPB Inhale 1 puff into the lungs every 12 (twelve) hours. Rinse after use.   HUMALOG KWIKPEN 100 UNIT/ML KwikPen Inject 10 Units into the skin 3 (three) times daily. SUB-Q 3 Times Daily   hydrOXYzine (ATARAX) 25 MG tablet SMARTSIG:1 Tablet(s) By Mouth Every Evening   INCRUSE ELLIPTA 62.5 MCG/ACT AEPB SMARTSIG:2 Puff(s) Via Inhaler Daily   ipratropium-albuterol (DUONEB) 0.5-2.5 (3) MG/3ML SOLN Take 3 mLs by nebulization in the morning, at noon, and at bedtime.   ketorolac (TORADOL) 10 MG tablet Take 1 tablet (10 mg total) by mouth every 8 (eight) hours as needed.   lamoTRIgine (LAMICTAL) 150 MG tablet Take 150 mg by mouth daily.   levothyroxine (SYNTHROID) 75 MCG tablet Take 1 tablet (75 mcg total) by mouth daily before breakfast.   losartan (COZAAR) 50 MG tablet Take 1 tablet (50 mg total) by mouth daily.    montelukast (SINGULAIR) 10 MG tablet Take 1 tablet (10 mg total) by mouth at bedtime.   Nebulizers (COMPRESSOR/NEBULIZER) MISC Use as directed   nitroGLYCERIN (NITROSTAT) 0.4 MG SL tablet Place 1 tablet (0.4 mg total) under the tongue every 5 (five) minutes as needed for chest pain.   omeprazole (PRILOSEC) 40 MG capsule Take 40 mg by mouth every morning.   PAXIL 20 MG tablet Take 20 mg by mouth daily.   promethazine (PHENERGAN) 12.5 MG tablet Take 12.5 mg by mouth every 8 (eight) hours as needed.   propranolol (INDERAL) 20 MG tablet TAKE 1 TABLET BY MOUTH THREE TIMES A DAY   Respiratory Therapy Supplies (NEBULIZER/TUBING/MOUTHPIECE) KIT Use as directed   tinidazole (TINDAMAX) 500 MG tablet Take 4 tablets for 5 days   tirzepatide [facility]) 2.5 MG/0.5ML Pen Inject 2.5 mg into the skin once a week.   traZODone (DESYREL) 50 MG tablet Take 50-100 mg by mouth at bedtime.   ziprasidone (GEODON) 60 MG capsule Take 60 mg by mouth daily.   No facility-administered encounter medications on file as of 11/18/2022.    Subjective Noticed a cyst of the breast for 2 weeks or so Tender  Past Medical History:  Diagnosis Date   Abnormal CT of liver    Anxiety    Asthma    Asthma    Bipolar 1 disorder (HCC)    Bipolar disorder (HCC)    Chronic pain  Cirrhosis (HCC)    Depression    Diabetes mellitus without complication (HCC)    Fatty liver    Gallstones    GERD (gastroesophageal reflux disease)    Headache(784.0)    Hyperlipidemia    Hypertension    Hypothyroidism    Pulmonary nodule    Tachycardia    Thyroid disease    Vitamin D deficiency     Past Surgical History:  Procedure Laterality Date   DILATION AND CURETTAGE OF UTERUS     1 yr ago   UPPER GI ENDOSCOPY  08/28/2022    OB History     Gravida  1   Para      Term      Preterm      AB  1   Living  0      SAB  1   IAB      Ectopic      Multiple      Live Births              Allergies  Allergen  Reactions   Bactrim [Sulfamethoxazole-Trimethoprim] Anaphylaxis and Other (See Comments)    Chest pain, trouble breathing   Ciprofloxacin Palpitations   Clindamycin/Lincomycin     Other reaction(s): Chest Pain   Flagyl [Metronidazole] Shortness Of Breath   Iodinated Contrast Media Swelling   Ondansetron Shortness Of Breath   Liraglutide Nausea And Vomiting   Doxycycline Nausea And Vomiting   Penicillins    Prednisone Other (See Comments)    Makes her angry and irritable    Social History   Socioeconomic History   Marital status: Single    Spouse name: Not on file   Number of children: 0   Years of education: Not on file   Highest education level: 9th grade  Occupational History   Not on file  Tobacco Use   Smoking status: Every Day    Current packs/day: 1.00    Average packs/day: 1 pack/day for 20.0 years (20.0 ttl pk-yrs)    Types: Cigarettes   Smokeless tobacco: Never   Tobacco comments:    Smoking a pack per day.  03/11/2022 hfb  Vaping Use   Vaping status: Never Used  Substance and Sexual Activity   Alcohol use: No    Comment: 6 months sober   Drug use: No   Sexual activity: Not Currently    Partners: Male    Birth control/protection: Abstinence  Other Topics Concern   Not on file  Social History Narrative   Not on file   Social Determinants of Health   Financial Resource Strain: Low Risk  (10/02/2022)   Overall Financial Resource Strain (CARDIA)    Difficulty of Paying Living Expenses: Not hard at all  Food Insecurity: No Food Insecurity (10/02/2022)   Hunger Vital Sign    Worried About Running Out of Food in the Last Year: Never true    Ran Out of Food in the Last Year: Never true  Transportation Needs: No Transportation Needs (10/02/2022)   PRAPARE - Administrator, Civil Service (Medical): No    Lack of Transportation (Non-Medical): No  Physical Activity: Inactive (10/02/2022)   Exercise Vital Sign    Days of Exercise per Week: 0 days     Minutes of Exercise per Session: 0 min  Stress: No Stress Concern Present (10/02/2022)   [employer] of Occupational Health - Occupational Stress Questionnaire    Feeling of Stress : Not at all  Social Connections:  Socially Isolated (10/02/2022)   Social Connection and Isolation Panel [NHANES]    Frequency of Communication with Friends and Family: Once a week    Frequency of Social Gatherings with Friends and Family: Once a week    Attends Religious Services: Never    Database administrator or Organizations: No    Attends Engineer, structural: Never    Marital Status: Never married    Family History  Problem Relation Age of Onset   Asthma Mother    Valvular heart disease Father        Mitral valve repair Dr. Cornelius Moras 2007   Diabetes Maternal Grandmother    Hypertension Maternal Grandmother     Medications:       Current Outpatient Medications:    ACCU-CHEK AVIVA PLUS test strip, 1 each by Other route 3 (three) times daily., Disp: , Rfl:    ACCU-CHEK FASTCLIX LANCETS MISC, USE AS DIRECTED, Disp: 102 each, Rfl: 3   albuterol (VENTOLIN HFA) 108 (90 Base) MCG/ACT inhaler, Inhale 2 puffs into the lungs every 6 (six) hours as needed for wheezing or shortness of breath., Disp: 18 g, Rfl: 1   amitriptyline (ELAVIL) 25 MG tablet, Take 50 mg by mouth at bedtime., Disp: , Rfl:    amLODipine (NORVASC) 5 MG tablet, Take 1 tablet (5 mg total) by mouth daily., Disp: 90 tablet, Rfl: 3   atorvastatin (LIPITOR) 40 MG tablet, Take 40 mg by mouth daily., Disp: , Rfl:    benzonatate (TESSALON) 200 MG capsule, Take 1 capsule (200 mg total) by mouth 3 (three) times daily as needed., Disp: 45 capsule, Rfl: 1   Blood Glucose Monitoring Suppl (ACCU-CHEK AVIVA PLUS) w/Device KIT, USE AS DIRECTED, Disp: 1 kit, Rfl: 0   clonazePAM (KLONOPIN) 0.5 MG disintegrating tablet, Take 0.5 mg by mouth daily as needed., Disp: , Rfl:    fluticasone-salmeterol (ADVAIR) 250-50 MCG/ACT AEPB, Inhale 1 puff into the  lungs every 12 (twelve) hours. Rinse after use., Disp: 60 each, Rfl: 5   HUMALOG KWIKPEN 100 UNIT/ML KwikPen, Inject 10 Units into the skin 3 (three) times daily. SUB-Q 3 Times Daily, Disp: 15 mL, Rfl: 2   hydrOXYzine (ATARAX) 25 MG tablet, SMARTSIG:1 Tablet(s) By Mouth Every Evening, Disp: , Rfl:    INCRUSE ELLIPTA 62.5 MCG/ACT AEPB, SMARTSIG:2 Puff(s) Via Inhaler Daily, Disp: , Rfl:    ipratropium-albuterol (DUONEB) 0.5-2.5 (3) MG/3ML SOLN, Take 3 mLs by nebulization in the morning, at noon, and at bedtime., Disp: 270 mL, Rfl: 11   ketorolac (TORADOL) 10 MG tablet, Take 1 tablet (10 mg total) by mouth every 8 (eight) hours as needed., Disp: 20 tablet, Rfl: 0   lamoTRIgine (LAMICTAL) 150 MG tablet, Take 150 mg by mouth daily., Disp: , Rfl:    levothyroxine (SYNTHROID) 75 MCG tablet, Take 1 tablet (75 mcg total) by mouth daily before breakfast., Disp: 90 tablet, Rfl: 0   losartan (COZAAR) 50 MG tablet, Take 1 tablet (50 mg total) by mouth daily., Disp: 30 tablet, Rfl: 0   montelukast (SINGULAIR) 10 MG tablet, Take 1 tablet (10 mg total) by mouth at bedtime., Disp: 90 tablet, Rfl: 3   Nebulizers (COMPRESSOR/NEBULIZER) MISC, Use as directed, Disp: 1 each, Rfl: 0   nitroGLYCERIN (NITROSTAT) 0.4 MG SL tablet, Place 1 tablet (0.4 mg total) under the tongue every 5 (five) minutes as needed for chest pain., Disp: 25 tablet, Rfl: 3   omeprazole (PRILOSEC) 40 MG capsule, Take 40 mg by mouth every morning., Disp: ,  Rfl:    PAXIL 20 MG tablet, Take 20 mg by mouth daily., Disp: , Rfl:    promethazine (PHENERGAN) 12.5 MG tablet, Take 12.5 mg by mouth every 8 (eight) hours as needed., Disp: , Rfl:    propranolol (INDERAL) 20 MG tablet, TAKE 1 TABLET BY MOUTH THREE TIMES A DAY, Disp: 270 tablet, Rfl: 2   Respiratory Therapy Supplies (NEBULIZER/TUBING/MOUTHPIECE) KIT, Use as directed, Disp: 10 kit, Rfl: 11   tinidazole (TINDAMAX) 500 MG tablet, Take 4 tablets for 5 days, Disp: 20 tablet, Rfl: 0   tirzepatide  (MOUNJARO) 2.5 MG/0.5ML Pen, Inject 2.5 mg into the skin once a week., Disp: 2 mL, Rfl: 0   traZODone (DESYREL) 50 MG tablet, Take 50-100 mg by mouth at bedtime., Disp: , Rfl:    ziprasidone (GEODON) 60 MG capsule, Take 60 mg by mouth daily., Disp: , Rfl:   Objective Blood pressure 117/83, pulse 82, height 5\' 3"  (1.6 m), weight 197 lb (89.4 kg), last menstrual period 10/06/2022.  Sebaceous cyst of the skin at the inner lower quadrant, right at the border of the breast and sternum/rib/costal margin, I think general surgery would be better option than me for removal given the location  Pertinent ROS No burning with urination, frequency or urgency No nausea, vomiting or diarrhea Nor fever chills or other constitutional symptoms   Labs or studies none    Impression + Management Plan: Diagnoses this Encounter::   ICD-10-CM   1. Sebaceous cyst of breast, right  N60.81 Ambulatory referral to General Surgery   superficial but right at edge of the sternum and breast tissue, General surgery would be better choice for removal, irritated by underwire        Medications prescribed during  this encounter: No orders of the defined types were placed in this encounter.   Labs or Scans Ordered during this encounter: Orders Placed This Encounter  Procedures   Ambulatory referral to General Surgery      Follow up No follow-ups on file.

## 2022-11-20 ENCOUNTER — Ambulatory Visit: Payer: MEDICAID | Admitting: Nurse Practitioner

## 2022-11-20 MED ORDER — CEFDINIR 300 MG PO CAPS: 300.0000 mg | ORAL_CAPSULE | Freq: Two times a day (BID) | ORAL | 0 refills | Status: DC

## 2022-11-20 NOTE — Telephone Encounter (Signed)
Sorry to hear you are not feeling well. Yes can begin  omnicef x 1 weeks (Zpack interacts with Geodon) (has taken omnicef in past)  If not feeling better will need office visit sooner for evaluation (RX sent)  Please contact office for sooner follow up if symptoms do not improve or worsen or seek emergency care

## 2022-11-24 ENCOUNTER — Encounter: Payer: Self-pay | Admitting: *Deleted

## 2022-11-25 NOTE — Telephone Encounter (Signed)
Needs an office visit. Zpack interacts with her GEODON.  Please contact office for sooner follow up if symptoms do not improve or worsen or seek emergency care

## 2022-11-26 ENCOUNTER — Other Ambulatory Visit: Payer: Self-pay | Admitting: Adult Health

## 2022-11-26 ENCOUNTER — Telehealth: Payer: Self-pay | Admitting: Internal Medicine

## 2022-11-26 MED ORDER — AZITHROMYCIN 250 MG PO TABS
ORAL_TABLET | ORAL | 0 refills | Status: DC
Start: 2022-11-26 — End: 2022-12-01

## 2022-11-26 NOTE — Progress Notes (Signed)
Rx azithromycin 

## 2022-11-26 NOTE — Progress Notes (Signed)
Mychart message sent to patient on 11/13/2022.  Message was seen by patient.  She did not send any questions regarding CT scan.  Patient has a f/u on 8/23 with PFT scheduled on 12/12/2022.  Nothing further needed.

## 2022-11-26 NOTE — Telephone Encounter (Signed)
Called and spoke with patient.  Patient stated she can only take a Z pack for her productive cough. Advised patient of needing a OV.  Patient stated she can not come to office this week.  Offered patient a my chart video visit.  Patient scheduled 11/27/22 at 1430 for video visit with [name],NP.

## 2022-11-26 NOTE — Telephone Encounter (Signed)
Called patient to change appointment due to provider schedule change. Patient states Cefdinir was called in and she cannot take that so she did not pick it up. Patient requesting zpak, states it does not bother her. Please advise and call back.

## 2022-11-26 NOTE — Progress Notes (Signed)
Established Patient Office Visit   Subjective:  Patient ID: Emily [name], female    DOB: [birth year]  Age: 42 y.o. MRN: [id number]  Chief Complaint  Patient presents with   Follow-up    Pt is here today to F/U on tirzepatide [facility]) 2.5 MG/0.5ML Pt reports vomiting the next day. Pt reports she done 1 injection.    HPI DM2: Chronic. Patient was started back on Humalog at her initial visit, but reports her blood sugars are still elevated and gaining weight. On previous visit, Mounjaro 2.5mg  weekly injections were started and following up in 1 month. Trying to manage until she can be seen by [facility] with Dr. [place]e [place] on 01/22/2023. Did receive a message on 07/25 that Greggory Keen was causing her to be sick with vomiting, abd pains, and unable to eat. She did have an appointment to discuss on 07/30, but it was rescheduled until today. She reports she only had one injection. Also, in the past she has not tolerated Metformin and Trulicity. Still taking Humalog 10U 3 times a day with meals, she reports her blood sugars are ranging fasting, 169-263.   ROS See HPI above     Objective:   BP 112/70   Pulse 87   Temp 98.7 F (37.1 C)   Ht 5\' 3"  (1.6 m)   Wt 198 lb 6 oz (90 kg)   LMP 10/06/2022   SpO2 93%   BMI 35.14 kg/m    Physical Exam Vitals reviewed.  Constitutional:      General: She is not in acute distress.    Appearance: Normal appearance. She is obese. She is not ill-appearing, toxic-appearing or diaphoretic.  HENT:     Head: Normocephalic and atraumatic.  Eyes:     General:        Right eye: No discharge.        Left eye: No discharge.     Conjunctiva/sclera: Conjunctivae normal.  Cardiovascular:     Rate and Rhythm: Normal rate.  Pulmonary:     Effort: Pulmonary effort is normal. No respiratory distress.  Musculoskeletal:        General: Normal range of motion.  Skin:    General: Skin is warm and dry.  Neurological:     General: No focal  deficit present.     Mental Status: She is alert and oriented to person, place, and time. Mental status is at baseline.  Psychiatric:        Mood and Affect: Mood normal.        Behavior: Behavior normal.        Thought Content: Thought content normal.        Judgment: Judgment normal.      Assessment & Plan:  Type 2 diabetes mellitus with complication, without long-term current use of insulin (HCC) Assessment & Plan: Discontinue Mounjaro injections due to the adverse reactions and side effects. Prescribed Actos 15mg  tablet, 1 tablet daily. Advised how and when to take medication. Provided additional written material on medication.  -Ordered lab to check liver enzymes and sodium level. Patient has a history of cirrhosis and concerned with starting how her liver function will do with starting medication. However, she reports she has been sober for 8 months. Congratulated her on this success. Patients sodium level was 134 at last appointment. Suspect this could be some related to elevated blood sugar readings.  -Placed a referral for nutrition and diabetic education. If you don't hear back about an appointment or get  a MyChart message, please call back to the office in 2 weeks.   Orders: -     Comprehensive metabolic panel -     Pioglitazone HCl; Take 1 tablet (15 mg total) by mouth daily.  Dispense: 30 tablet; Refill: 0 -     Amb Referral to Nutrition and Diabetic Education  Elevated liver enzymes -     Comprehensive metabolic panel  Low sodium levels -     Comprehensive metabolic panel  Follow-up exam  -Will start medication and follow up in 1 month until she can see endocrinology.  Return in about 1 month (around 01/01/2023) for follow-up.   Zandra Abts, NP

## 2022-11-27 ENCOUNTER — Telehealth: Payer: Self-pay | Admitting: Internal Medicine

## 2022-11-27 ENCOUNTER — Telehealth: Payer: MEDICAID | Admitting: Adult Health

## 2022-11-27 NOTE — Telephone Encounter (Signed)
Pt calling in bc she will like to understand that if she has something wrong with her lungs why should she be waiting an entire year to be seen again for a CT. Pt also wants to know what will be done to prevent it anything going forwards

## 2022-11-28 ENCOUNTER — Ambulatory Visit: Payer: MEDICAID | Admitting: Family Medicine

## 2022-11-28 NOTE — Telephone Encounter (Signed)
Pt has called 2x yesterday and once today for the same reason, can someone pls contact her about why she has to wait a year for a CT Scan

## 2022-12-01 ENCOUNTER — Ambulatory Visit (INDEPENDENT_AMBULATORY_CARE_PROVIDER_SITE_OTHER): Payer: MEDICAID | Admitting: Family Medicine

## 2022-12-01 ENCOUNTER — Encounter: Payer: Self-pay | Admitting: Family Medicine

## 2022-12-01 VITALS — BP 112/70 | HR 87 | Temp 98.7°F | Ht 63.0 in | Wt 198.4 lb

## 2022-12-01 DIAGNOSIS — R748 Abnormal levels of other serum enzymes: Secondary | ICD-10-CM

## 2022-12-01 DIAGNOSIS — E871 Hypo-osmolality and hyponatremia: Secondary | ICD-10-CM

## 2022-12-01 DIAGNOSIS — Z7984 Long term (current) use of oral hypoglycemic drugs: Secondary | ICD-10-CM

## 2022-12-01 DIAGNOSIS — E118 Type 2 diabetes mellitus with unspecified complications: Secondary | ICD-10-CM | POA: Diagnosis not present

## 2022-12-01 DIAGNOSIS — Z09 Encounter for follow-up examination after completed treatment for conditions other than malignant neoplasm: Secondary | ICD-10-CM

## 2022-12-01 LAB — COMPREHENSIVE METABOLIC PANEL
ALT: 38 U/L — ABNORMAL HIGH (ref 0–35)
AST: 49 U/L — ABNORMAL HIGH (ref 0–37)
Albumin: 3.9 g/dL (ref 3.5–5.2)
Alkaline Phosphatase: 143 U/L — ABNORMAL HIGH (ref 39–117)
BUN: 6 mg/dL (ref 6–23)
CO2: 25 mEq/L (ref 19–32)
Calcium: 8.9 mg/dL (ref 8.4–10.5)
Chloride: 94 mEq/L — ABNORMAL LOW (ref 96–112)
Creatinine, Ser: 0.65 mg/dL (ref 0.40–1.20)
GFR: 109.66 mL/min (ref >60.00)
Glucose, Bld: 311 mg/dL — ABNORMAL HIGH (ref 70–99)
Potassium: 3.9 mEq/L (ref 3.5–5.1)
Sodium: 126 mEq/L — ABNORMAL LOW (ref 135–145)
Total Bilirubin: 0.6 mg/dL (ref 0.2–1.2)
Total Protein: 7.6 g/dL (ref 6.0–8.3)

## 2022-12-01 MED ORDER — PIOGLITAZONE HCL 15 MG PO TABS
15.0000 mg | ORAL_TABLET | Freq: Every day | ORAL | 0 refills | Status: DC
Start: 2022-12-01 — End: 2023-06-15

## 2022-12-01 NOTE — Patient Instructions (Signed)
-  Discontinue Mounjaro injections due to the adverse reactions and side effects. START Actos 15mg  tablet, 1 tablet daily with a glass of water. Please take medication at the same time every day.  -Ordered lab to check liver enzymes and sodium level. Office will call with lab results and you may see them on MyChart.  -Placed a referral for nutrition and diabetic education. If you don't hear back about an appointment or get a MyChart message, please call back to the office in 2 weeks.  -Follow up in 1 month.

## 2022-12-01 NOTE — Telephone Encounter (Signed)
Spoke with patient and advised of results and recommendations.  She will ask follow up question to her PCP and upcoming pulmonary appointment.

## 2022-12-01 NOTE — Assessment & Plan Note (Signed)
Discontinue Mounjaro injections due to the adverse reactions and side effects. Prescribed Actos 15mg  tablet, 1 tablet daily. Advised how and when to take medication. Provided additional written material on medication.  -Ordered lab to check liver enzymes and sodium level. Patient has a history of cirrhosis and concerned with starting how her liver function will do with starting medication. However, she reports she has been sober for 8 months. Congratulated her on this success. Patients sodium level was 134 at last appointment. Suspect this could be some related to elevated blood sugar readings.  -Placed a referral for nutrition and diabetic education. If you don't hear back about an appointment or get a MyChart message, please call back to the office in 2 weeks.

## 2022-12-02 ENCOUNTER — Telehealth: Payer: Self-pay

## 2022-12-02 ENCOUNTER — Encounter: Payer: Self-pay | Admitting: Family Medicine

## 2022-12-02 NOTE — Telephone Encounter (Signed)
-----   Message from Zandra Abts sent at 12/01/2022  5:03 PM EDT ----- Your sodium is still low, suspect this is related to your elevated blood sugars. Overall, liver enzymes are improving. Will recheck labs at next visit to make sure that the new medication is not affecting your liver.  Hopefully, with adding Actos with Humalog, and placing a referral for diabetes nutrition, this will help your blood sugars.

## 2022-12-04 ENCOUNTER — Encounter (INDEPENDENT_AMBULATORY_CARE_PROVIDER_SITE_OTHER): Payer: Self-pay

## 2022-12-05 ENCOUNTER — Other Ambulatory Visit: Payer: Self-pay | Admitting: Family Medicine

## 2022-12-05 DIAGNOSIS — E118 Type 2 diabetes mellitus with unspecified complications: Secondary | ICD-10-CM

## 2022-12-08 NOTE — Telephone Encounter (Signed)
Pt has my chart visit tomorrow.

## 2022-12-08 NOTE — Telephone Encounter (Signed)
Called pt to try to schedule her for visit.

## 2022-12-09 ENCOUNTER — Ambulatory Visit: Payer: MEDICAID | Admitting: Family Medicine

## 2022-12-09 ENCOUNTER — Telehealth: Payer: Self-pay | Admitting: Family Medicine

## 2022-12-09 ENCOUNTER — Encounter: Payer: Self-pay | Admitting: Family Medicine

## 2022-12-09 ENCOUNTER — Telehealth: Payer: MEDICAID | Admitting: Family Medicine

## 2022-12-09 VITALS — HR 81 | Ht 63.0 in | Wt 198.0 lb

## 2022-12-09 DIAGNOSIS — G44209 Tension-type headache, unspecified, not intractable: Secondary | ICD-10-CM

## 2022-12-09 MED ORDER — KETOROLAC TROMETHAMINE 10 MG PO TABS: 10.0000 mg | ORAL_TABLET | Freq: Three times a day (TID) | ORAL | 0 refills | Status: AC | PRN

## 2022-12-09 NOTE — Telephone Encounter (Signed)
Pt states she would like the  Psychiatrics referral please

## 2022-12-09 NOTE — Telephone Encounter (Signed)
Pt is asking if you have any recommendations on psychiatrics or other behavioral health in the area?

## 2022-12-09 NOTE — Progress Notes (Signed)
Virtual Visit via Video Note  I connected with Emily [name] on 12/09/22 at 1:29 by a video enabled telemedicine application and verified that I am speaking with the correct person using two identifiers.   Patient Location: Home Provider Location: office - [facility].    I discussed the limitations, risks, security and privacy concerns of performing an evaluation and management service by telephone and the availability of in person appointments. I also discussed with the patient that there may be a patient responsible charge related to this service. The patient expressed understanding and agreed to proceed, consent obtained  Chief Complaint  Patient presents with   Headache    Pt reports she has pressure on her head , temples are hurting (sharp pain). Sx started 12/05/2022 Pt reports she can only check HR/ oxygen    History of Present Illness: Emily [name] is a 42 y.o. female that presents with vertex and bilateral temple headaches. Patient reports it has been an intermittent headache that started Friday. She reports some morning she will wake up with the headache. Other days the headache occurs gradually. Described as burning, throbbing, and pressure. She reports the pain radiates down the sides of her head. Report lightheadedness, fatigue, photosensitivity and light sensitivity. Denies dizziness, blurred vision, weakness, chest pain, shortness of breath, nausea, vomiting, sinus congestion or drainage, or fever, numbness or tingling on either side of body. She reports she has took Tylenol 1000mg  BID and used a cold compress for her headaches with little relief. She denies a history of headaches.   Patient Active Problem List   Diagnosis Date Noted   Cyst of left ovary 08/11/2022   ASCUS with positive high risk HPV cervical 08/11/2022   Moody 07/30/2022   Hot flashes 07/30/2022   Friable cervix 07/30/2022   Spotting 07/30/2022   Routine Papanicolaou smear 07/30/2022    Pregnancy examination or test, negative result 07/30/2022   Amenorrhea, secondary 07/30/2022   Bartholin cyst 04/30/2022   Irregular periods 04/30/2022   Dental caries 03/11/2022   Tick bite of back 12/20/2020   Chronic bronchitis (HCC) 07/09/2020   Breast tenderness 09/20/2018   Gastroesophageal reflux disease 09/16/2018   Shortness of breath 08/05/2018   Abnormal finding on lung imaging 08/05/2018   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   Vaginal odor 09/30/2017   BV (bacterial vaginosis) 09/30/2017   Swollen lymph nodes 01/06/2017   Vaginal irritation 10/08/2016   RUQ pain 10/08/2016   LLQ pain 10/08/2016   Vaginal discharge 10/08/2016   Menstrual period late 10/08/2016   Cyst of right Bartholin's gland 12/17/2015   Trichomonal vaginitis 10/16/2015   Recurrent boils 06/18/2015   Type 2 diabetes mellitus with complication, without long-term current use of insulin (HCC) 04/18/2015   Need for influenza vaccination 04/18/2015   Ingrowing toenail 04/18/2015   Vitamin D deficiency 04/18/2015   Angular cheilitis 04/18/2015   Dyslipidemia 04/18/2015   Diabetes mellitus with complication in adult patient (HCC) 02/20/2015   Smoker 02/20/2015   Essential hypertension 02/20/2015   Pain in limb 02/20/2015   Myalgia 02/20/2015   Hypothyroidism 02/20/2015   Leg pain, inferior 02/20/2015   Toenail deformity 02/20/2015   Elevated liver enzymes 01/07/2012   Abnormal CT of liver 09/30/2011   Fatty liver 09/30/2011   G E REFLUX 11/30/2007   CHEST PAIN-UNSPECIFIED 11/30/2007   HEADACHE 10/21/2007   DYSPNEA 09/15/2007   UNSPECIFIED TACHYCARDIA 06/01/2007   COUGH 05/24/2007   URINARY INCONTINENCE 05/24/2007   TOBACCO ABUSE 05/20/2007   ASTHMATIC  BRONCHITIS, ACUTE 05/18/2007   Pulmonary nodules 05/12/2007   DEPRESSION 04/19/2007   Asthma 04/19/2007   Past Medical History:  Diagnosis Date   Abnormal CT of liver    Anxiety    Asthma    Asthma    Bipolar 1 disorder (HCC)    Bipolar  disorder (HCC)    Chronic pain    Cirrhosis (HCC)    Depression    Diabetes mellitus without complication (HCC)    Fatty liver    Gallstones    GERD (gastroesophageal reflux disease)    Headache(784.0)    Hyperlipidemia    Hypertension    Hypothyroidism    Pulmonary nodule    Tachycardia    Thyroid disease    Vitamin D deficiency    Past Surgical History:  Procedure Laterality Date   DILATION AND CURETTAGE OF UTERUS     1 yr ago   UPPER GI ENDOSCOPY  08/28/2022   Allergies  Allergen Reactions   Bactrim [Sulfamethoxazole-Trimethoprim] Anaphylaxis and Other (See Comments)    Chest pain, trouble breathing   Ciprofloxacin Palpitations   Clindamycin/Lincomycin     Other reaction(s): Chest Pain   Flagyl [Metronidazole] Shortness Of Breath   Iodinated Contrast Media Swelling   Ondansetron Shortness Of Breath   Liraglutide Nausea And Vomiting   Doxycycline Nausea And Vomiting   Penicillins    Prednisone Other (See Comments)    Makes her angry and irritable   Prior to Admission medications   Medication Sig Start Date End Date Taking? Authorizing Provider  ACCU-CHEK AVIVA PLUS test strip 1 each by Other route 3 (three) times daily. 04/28/20  Yes [provider]  ACCU-CHEK FASTCLIX LANCETS MISC USE AS DIRECTED 12/10/17  Yes Tysinger, Kermit Balo, PA-C  albuterol (VENTOLIN HFA) 108 (90 Base) MCG/ACT inhaler Inhale 2 puffs into the lungs every 6 (six) hours as needed for wheezing or shortness of breath. 12/12/20  Yes Bevelyn Ngo, NP  amitriptyline (ELAVIL) 25 MG tablet Take 50 mg by mouth at bedtime. 08/29/19  Yes [provider]  amLODipine (NORVASC) 5 MG tablet Take 1 tablet (5 mg total) by mouth daily. 12/09/21  Yes Nahser, Deloris Ping, MD  atorvastatin (LIPITOR) 40 MG tablet Take 40 mg by mouth daily. 02/05/21  Yes [provider]  benzonatate (TESSALON) 200 MG capsule Take 1 capsule (200 mg total) by mouth 3 (three) times daily as needed. 03/11/22 03/11/23 Yes  Parrett, [name] S, NP  Blood Glucose Monitoring Suppl (ACCU-CHEK AVIVA PLUS) w/Device KIT USE AS DIRECTED 05/01/16  Yes Tysinger, Kermit Balo, PA-C  clonazePAM (KLONOPIN) 0.5 MG disintegrating tablet Take 0.5 mg by mouth daily as needed. 11/22/19  Yes [provider]  fluticasone-salmeterol (ADVAIR) 250-50 MCG/ACT AEPB Inhale 1 puff into the lungs every 12 (twelve) hours. Rinse after use. 07/22/22  Yes Parrett, [name] S, NP  HUMALOG KWIKPEN 100 UNIT/ML KwikPen Inject 10 Units into the skin 3 (three) times daily. SUB-Q 3 Times Daily 09/24/22 02/21/23 Yes Alveria Apley, NP  hydrOXYzine (ATARAX) 25 MG tablet SMARTSIG:1 Tablet(s) By Mouth Every Evening 09/13/22  Yes [provider]  INCRUSE ELLIPTA 62.5 MCG/ACT AEPB SMARTSIG:2 Puff(s) Via Inhaler Daily 09/10/22  Yes [provider]  ipratropium-albuterol (DUONEB) 0.5-2.5 (3) MG/3ML SOLN Take 3 mLs by nebulization in the morning, at noon, and at bedtime. 09/17/22  Yes Parrett, [name] S, NP  ketorolac (TORADOL) 10 MG tablet Take 1 tablet (10 mg total) by mouth every 8 (eight) hours as needed. 10/09/22  Yes  Cyril Mourning A, NP  lamoTRIgine (LAMICTAL) 150 MG tablet Take 150 mg by mouth daily.   Yes [provider]  levothyroxine (SYNTHROID) 75 MCG tablet Take 1 tablet (75 mcg total) by mouth daily before breakfast. 09/24/22 12/23/22 Yes Alveria Apley, NP  losartan (COZAAR) 50 MG tablet Take 1 tablet (50 mg total) by mouth daily. 11/08/20  Yes Nahser, Deloris Ping, MD  montelukast (SINGULAIR) 10 MG tablet Take 1 tablet (10 mg total) by mouth at bedtime. 04/22/22  Yes Parrett, Virgel Bouquet, NP  Nebulizers (COMPRESSOR/NEBULIZER) MISC Use as directed 11/15/19  Yes Kalman Shan, MD  nitroGLYCERIN (NITROSTAT) 0.4 MG SL tablet Place 1 tablet (0.4 mg total) under the tongue every 5 (five) minutes as needed for chest pain. 03/10/22  Yes Nahser, Deloris Ping, MD  omeprazole (PRILOSEC) 40 MG capsule Take 40 mg by mouth every morning. 08/05/21  Yes  [provider]  PAXIL 20 MG tablet Take 20 mg by mouth daily. 08/29/19  Yes [provider]  pioglitazone (ACTOS) 15 MG tablet Take 1 tablet (15 mg total) by mouth daily. 12/01/22 12/31/22 Yes Alveria Apley, NP  promethazine (PHENERGAN) 12.5 MG tablet Take 12.5 mg by mouth every 8 (eight) hours as needed. 09/24/21  Yes [provider]  propranolol (INDERAL) 20 MG tablet TAKE 1 TABLET BY MOUTH THREE TIMES A DAY 07/07/22  Yes Nahser, Deloris Ping, MD  Respiratory Therapy Supplies (NEBULIZER/TUBING/MOUTHPIECE) KIT Use as directed 11/15/19  Yes Kalman Shan, MD  tinidazole (TINDAMAX) 500 MG tablet Take 4 tablets for 5 days 10/29/22  Yes Alveria Apley, NP  traZODone (DESYREL) 50 MG tablet Take 50-100 mg by mouth at bedtime. 10/01/21  Yes [provider]  ziprasidone (GEODON) 60 MG capsule Take 60 mg by mouth daily. 05/22/20  Yes [provider]  zolpidem (AMBIEN) 5 MG tablet Take by mouth. 10/14/22  Yes [provider]   Social History   Socioeconomic History   Marital status: Single    Spouse name: Not on file   Number of children: 0   Years of education: Not on file   Highest education level: 9th grade  Occupational History   Not on file  Tobacco Use   Smoking status: Every Day    Current packs/day: 1.00    Average packs/day: 1 pack/day for 20.0 years (20.0 ttl pk-yrs)    Types: Cigarettes   Smokeless tobacco: Never   Tobacco comments:    Smoking a pack per day.  03/11/2022 hfb  Vaping Use   Vaping status: Never Used  Substance and Sexual Activity   Alcohol use: No    Comment: 6 months sober   Drug use: No   Sexual activity: Not Currently    Partners: Male    Birth control/protection: Abstinence  Other Topics Concern   Not on file  Social History Narrative   Not on file   Social Determinants of Health   Financial Resource Strain: Low Risk  (10/02/2022)   Overall Financial Resource Strain (CARDIA)    Difficulty of  Paying Living Expenses: Not hard at all  Food Insecurity: No Food Insecurity (10/02/2022)   Hunger Vital Sign    Worried About Running Out of Food in the Last Year: Never true    Ran Out of Food in the Last Year: Never true  Transportation Needs: No Transportation Needs (10/02/2022)   PRAPARE - Administrator, Civil Service (Medical): No    Lack of Transportation (Non-Medical): No  Physical  Activity: Inactive (10/02/2022)   Exercise Vital Sign    Days of Exercise per Week: 0 days    Minutes of Exercise per Session: 0 min  Stress: No Stress Concern Present (10/02/2022)   [employer] of Occupational Health - Occupational Stress Questionnaire    Feeling of Stress : Not at all  Social Connections: Socially Isolated (10/02/2022)   Social Connection and Isolation Panel [NHANES]    Frequency of Communication with Friends and Family: Once a week    Frequency of Social Gatherings with Friends and Family: Once a week    Attends Religious Services: Never    Database administrator or Organizations: No    Attends Banker Meetings: Never    Marital Status: Never married  Intimate Partner Violence: Not At Risk (11/07/2022)   Received from [facility]   HITS    Over the last 12 months how often did your partner physically hurt you?: 1    Over the last 12 months how often did your partner insult you or talk down to you?: 1    Over the last 12 months how often did your partner threaten you with physical harm?: 1    Over the last 12 months how often did your partner scream or curse at you?: 1    Observations/Objective: Today's Vitals   12/09/22 1323  Pulse: 81  SpO2: 96%  Weight: 198 lb (89.8 kg)  Height: 5\' 3"  (1.6 m)  Patient reports pulse, weight, height, and oxygen saturation. Patient is unable to obtain all other vital signs due to not having equipment at home to obtain.  Physical Exam Constitutional:      General: She is not in acute distress.    Appearance:  She is obese. She is not ill-appearing, toxic-appearing or diaphoretic.  HENT:     Head: Normocephalic and atraumatic.  Eyes:     General:        Right eye: No discharge.        Left eye: No discharge.     Conjunctiva/sclera: Conjunctivae normal.  Cardiovascular:     Rate and Rhythm: Normal rate.  Pulmonary:     Effort: Pulmonary effort is normal. No respiratory distress.  Skin:    General: Skin is dry.  Neurological:     General: No focal deficit present.     Mental Status: She is alert and oriented to person, place, and time. Mental status is at baseline.  Psychiatric:        Mood and Affect: Mood normal.        Behavior: Behavior normal.        Thought Content: Thought content normal.        Judgment: Judgment normal.     Assessment and Plan: Acute non intractable tension-type headache -     Ketorolac Tromethamine; Take 1 tablet (10 mg total) by mouth every 8 (eight) hours as needed for up to 5 days.  Dispense: 15 tablet; Refill: 0  -Prescribed Ketorolac Tromethamine 10mg  every 8 hours for 5 days as needed for headache.  -Recommend to continue with using ice compresses and Tylenol to help.  -Rest. Hydrate. -Discussed about red flags that warrant her to go to the emergency department.  -Provided written information about signs of a stroke, since she is having a headache, and information about general headache without a cause.   Follow Up Instructions: If not improved.    I discussed the assessment and treatment plan with the patient. The patient  was provided an opportunity to ask questions and all were answered. The patient agreed with the plan and demonstrated an understanding of the instructions.   The patient was advised to call back or seek an in-person evaluation if the symptoms worsen or if the condition fails to improve as anticipated.  Zandra Abts, NP

## 2022-12-09 NOTE — Telephone Encounter (Signed)
Caller name: DEOVEON SPINOLA  On DPR?: Yes  Call back number: [phone] (home)  Provider they see: Alveria Apley, NP  Reason for call:  Asking for mental health/psychiatrist suggestions

## 2022-12-09 NOTE — Telephone Encounter (Signed)
Pt reports the medication is making her feel "funny" and feels like her hear is fluttering   How should pt proceed?

## 2022-12-10 ENCOUNTER — Other Ambulatory Visit: Payer: Self-pay

## 2022-12-10 DIAGNOSIS — F419 Anxiety disorder, unspecified: Secondary | ICD-10-CM

## 2022-12-10 DIAGNOSIS — F32A Depression, unspecified: Secondary | ICD-10-CM

## 2022-12-10 DIAGNOSIS — F319 Bipolar disorder, unspecified: Secondary | ICD-10-CM

## 2022-12-10 NOTE — Telephone Encounter (Signed)
Referral is placed.

## 2022-12-12 ENCOUNTER — Encounter: Payer: Self-pay | Admitting: Internal Medicine

## 2022-12-12 ENCOUNTER — Ambulatory Visit (INDEPENDENT_AMBULATORY_CARE_PROVIDER_SITE_OTHER): Payer: MEDICAID | Admitting: Internal Medicine

## 2022-12-12 VITALS — BP 110/80 | HR 83 | Ht 63.0 in | Wt 200.6 lb

## 2022-12-12 DIAGNOSIS — K7469 Other cirrhosis of liver: Secondary | ICD-10-CM

## 2022-12-12 DIAGNOSIS — R918 Other nonspecific abnormal finding of lung field: Secondary | ICD-10-CM

## 2022-12-12 DIAGNOSIS — F17210 Nicotine dependence, cigarettes, uncomplicated: Secondary | ICD-10-CM

## 2022-12-12 DIAGNOSIS — I2510 Atherosclerotic heart disease of native coronary artery without angina pectoris: Secondary | ICD-10-CM | POA: Diagnosis not present

## 2022-12-12 DIAGNOSIS — F17200 Nicotine dependence, unspecified, uncomplicated: Secondary | ICD-10-CM

## 2022-12-12 DIAGNOSIS — I517 Cardiomegaly: Secondary | ICD-10-CM | POA: Diagnosis not present

## 2022-12-12 DIAGNOSIS — J45909 Unspecified asthma, uncomplicated: Secondary | ICD-10-CM

## 2022-12-12 MED ORDER — VARENICLINE TARTRATE 0.5 MG PO TABS: ORAL_TABLET | ORAL | 0 refills | Status: DC

## 2022-12-12 NOTE — Patient Instructions (Addendum)
Multiple lung nodules on CT  - newer nodules in 2024 June compared to 2022  Plan  - repeat CT chest wo contrast in 8 months  TOBACCO ABUSE  - glad you want to quit  Plan  -Cut down smoking by 1 cigarette each day so that you are completely quit in 3 weeks --  start chantix as directed  - Days 1-3: 0.5 mg once daily  - Days 4-7: 0.5 mg twice daily  - Maintenance (? Day 8):  1 mg twice daily for 11 weeks   - if you have bad dream stop medication and call us  - take the medicaiton as instructed and after food - quit smoking  3 week after starting chantix   as described above   Asthma  -Currently well-controlled plan - Continue current inhalers  Cardiomegaly noted on CT chest June 2024 Coronary artery calcification seen on CAT scan -June 2024 with negative stress test 2022  -Last echocardiogram was in 2023  Plan - Repeat echocardiogram  Other cirrhosis of liver (HCC)  -New problem and being managed [facility]  Plan - Continue support by GI services - Talk to primary care physician start yourself on weight loss drugs  Follow-up - 4 to 6 weeks video visit with nurse practitioner - 30-month visit with Dr. Marchelle Gearing after CT scan of the chest 4 weeks with nurse practitioner in 8 months with Dr. Marchelle Gearing after CT chest

## 2022-12-12 NOTE — Progress Notes (Signed)
07/22/2022 Follow up: Chronic bronchitis, Abnormal Chest  42 yo female active smoker-Heavy smoking history followed for Chronic bronchitis and rhinitis , abnormal CT chest  Medical history significant for DM, Bipolar disorder, and panic disorder   Patient presents for a 18-month follow-up.  Patient has a very heavy smoking history.  She is treated for chronic bronchitis with previous spirometry showing severe restrictive lung disease.  Patient has been recommended for inhalers in the past with Markus Daft and Incruse unfortunately patient says that she did not get these for multiple reasons.  She does take DuoNeb 3 times daily.  She continues to smoke.  We discussed smoking cessation.  Previous CT chest chest showed possible smoking-related bronchiolitis.  With fine nodularity in the lung apices.  Previous high-resolution CT chest in April 2022 showed stable scarring with no evidence of interstitial lung disease. Patient recently had acute flare of bronchitis.  With increased cough and congestion.  She was given a Z-Pak.  She is on day 4 of 5.  She says she is feeling much better.  Cough and congestion have almost totally resolved.  Patient has been recommended to follow-up with dentistry multiple times and she has multiple dental caries and broken teeth.  She is prone to recurrent bronchitis. She also has been recommended to return for pulmonary function testing on multiple occasions but unfortunately has canceled all the follow-ups.  We discussed again how important it is for [place] to follow-up on her lung function.  Patient recently had some GI issues and had a CT abdomen pelvis that showed some lung nodularity.  She had a dedicated CT chest completed on July 10, 2022.  Report is in care everywhere that showed scattered bilateral pulmonary nodules measuring up to 8 mm and faint groundglass opacities and nodularity in the upper lobes.  Mild mediastinal adenopathy.  Patient has been recommended for a  59-month CT follow-up.  She denies any hemoptysis or unintentional weight loss.  Previous CT scans have shown coronary atherosclerosis.  She has been referred to cardiology and is following.  She recently had a coronary CT chest in November 2023 that was nondiagnostic due to movement.  Patient has been recommend to follow up for repeat scan.     TEST/EVENTS :  Labs: 2020 :  Ace - 81 Esr - 30 Rh<14 CCP<16 ANCA - negative  Hypersensitive activity pneumonitis panel negative Anti-Jo 1 less than 0.2 RNP antibodies greater than 8 Sjogren's syndrome antibodies negative Anti-scleroderma antibody less than 1 Aldolase 4.5 MPO/PR-3 (ANCA) antibodies-less than 1 Total CK 47, CK-MB less than 7 -negative   04/01/2018-CT chest high resolution- few patchy areas of mild groundglass attenuation in the lungs bilaterally nonspecific, stable pulmonary nodules from 2009    feNO - 01/26/2018 - 5 ppbd and is normal   01/08/2015- spirometry- FVC 1.33 (38% predicted), ratio 72, FEV1 1.01 (37% predicted)   High-resolution CT chest July 25, 2020 bandlike scarring left base, no evidence of interstitial lung disease, mild air trapping, fine centrilobular nodularity concentrated in the lung apices consistent with smoking-related bronchiolitis. Neg for ILD .   OV 12/12/2022  Subjective:  Patient ID: Geroge [name], female , DOB: 03-27-1981 , age 72 y.o. , MRN: [id number] , ADDRESS: [address] [place] [address] PCP Alveria Apley, NP Patient Care Team: Alveria Apley, NP as PCP - General (Family Medicine) Rollene Rotunda, MD as PCP - Cardiology (Cardiology) Adline Potter, NP as Nurse Practitioner (Obstetrics and Gynecology)  [facility], [facility] Kalman Shan, MD as Consulting Physician (Pulmonary Disease) Nahser, Deloris Ping, MD as Consulting Physician (Cardiology)  This Provider for this visit: Treatment Team:  Attending Provider: Kalman Shan,  MD    12/12/2022 -   Chief Complaint  Patient presents with   Follow-up    F/up on CT scan     HPI Emily [name] 42 y.o. -returns for follow-up.  I personally not seen in a few years.  She is here with her dad.  Her mother Emily [name] was also my patient for sarcoid.  She is always been worried about lung nodules and lung cancer.  Currently recently they tell me that she was artificially drinking and last he was diagnosed with cirrhosis of the liver.  She gets her care at [facility].  According to history there is no varices.  We really worried about the cirrhosis.  She is also got morbid obesity.  She does not know or realize that NASH could be a cause of the cirrhosis.  She did try to lose weight with the help of Mounjaro but this gives side effects.  I have encouraged her to talk to primary care about further other weight loss drugs.    Most recently in 2024 for June she had a CT scan for lung nodules and there are new or nodules.  This requires follow-up.  I personally visualized and showed it to her.  She is in agreement.  In terms of asthma: Is under good control  Smoking: The dad is really worried that if she continues to smoke she will get lung diseases and also be contraindicated for the transplant on account of cirrhosis.  She wants to do quit smoking drugs.  She smokes 1 pack of cigarettes a day.  We discussed Chantix and she is interested.  She does not have any firearms no suicidal ideations.  No concomitant alcohol.  We talked about her reducing smoking by 1 cigarette a day in each pack and therefore should be completely quit in 3 weeks.  She is open to this idea. > 3 min in quit smoking effforts  CT Chest data from date: *10/22/22  - personally visualized and independently interpreted : YES - my findings are: SAME AS BELOW Narrative & Impression  CLINICAL DATA:  Abnormal CT chest, smoker.  Cough.   EXAM: CT CHEST WITHOUT CONTRAST   TECHNIQUE: Multidetector CT imaging of  the chest was performed following the standard protocol without IV contrast.   RADIATION DOSE REDUCTION: This exam was performed according to the departmental dose-optimization program which includes automated exposure control, adjustment of the mA and/or kV according to patient size and/or use of iterative reconstruction technique.   COMPARISON:  Cardiac CT 03/12/2022 and CT chest 07/25/2020.   FINDINGS: Cardiovascular: Left anterior descending coronary artery calcification. Heart is enlarged. No pericardial effusion.   Mediastinum/Nodes: Mediastinal lymph nodes measure up to 10 mm in the low right paratracheal station, as on 07/25/2020. Hilar regions are difficult to definitively evaluate without IV contrast. No axillary adenopathy. Esophagus is grossly unremarkable.   Lungs/Pleura: Smoking related respiratory bronchiolitis. Multiple bilateral pulmonary nodules, many of which are new from 07/25/2020 and measure up to 5 mm in the right upper lobe (4/41). Subsegmental volume loss in both lower lobes. No pleural fluid. Airway is unremarkable.   Upper Abdomen: Liver margin is irregular with hypertrophy of the left hepatic lobe and caudate. Visualized portions of the liver, gallbladder, adrenal glands and kidneys  are otherwise unremarkable. Spleen measures approximately 13.7 cm. Spleen, pancreas and stomach are otherwise grossly unremarkable. Small to borderline enlarged upper abdominal lymph nodes, as on 07/25/2020.   Musculoskeletal: Degenerative changes in the spine.   IMPRESSION: 1. Multiple bilateral pulmonary nodules measure 5 mm or less in size, many of which are new from 07/25/2020. Non-contrast chest CT is recommended in 12 months as the patient is at increased risk for bronchogenic carcinoma. This recommendation follows the consensus statement: Guidelines for Management of Incidental Pulmonary Nodules Detected on CT Images: From the Fleischner Society 2017;  Radiology 2017; 284:228-243. 2. Age advanced left anterior descending coronary artery calcification. 3. Cirrhosis. 4. Splenomegaly.     Electronically Signed   By: Leanna Battles M.D.   On: 10/22/2022 08:42    PFT      No data to display            LAB RESULTS last 96 hours No results found.  LAB RESULTS last 90 days Recent Results (from the past 2160 hour(s))  HIV antibody (with reflex)     Status: None   Collection Time: 09/24/22  2:51 PM  Result Value Ref Range   HIV 1&2 Ab, 4th Generation NON-REACTIVE NON-REACTIVE    Comment: HIV-1 antigen and HIV-1/HIV-2 antibodies were not detected. There is no laboratory evidence of HIV infection. Marland Kitchen PLEASE NOTE: This information has been disclosed to you from records whose confidentiality may be protected by state law.  If your state requires such protection, then the state law prohibits you from making any further disclosure of the information without the specific written consent of the person to whom it pertains, or as otherwise permitted by law. A general authorization for the release of medical or other information is NOT sufficient for this purpose. . For additional information please refer to http://education.questdiagnostics.com/faq/FAQ106 (This link is being provided for informational/ educational purposes only.) . Marland Kitchen The performance of this assay has not been clinically validated in patients less than 18 years old. .   Hepatitis C Antibody     Status: None   Collection Time: 09/24/22  2:51 PM  Result Value Ref Range   Hepatitis C Ab NON-REACTIVE NON-REACTIVE    Comment: . HCV antibody was non-reactive. There is no laboratory  evidence of HCV infection. . In most cases, no further action is required. However, if recent HCV exposure is suspected, a test for HCV RNA (test code 82956) is suggested. . For additional information please refer to http://education.questdiagn[employer].com/faq/FAQ22v1 (This link  is being provided for informational/ educational purposes only.) .   Urine Microalbumin w/creat. ratio     Status: Abnormal   Collection Time: 09/24/22  2:51 PM  Result Value Ref Range   Microalb, Ur 2.2 (H) 0.0 - 1.9 mg/dL   Creatinine,U 213.0 mg/dL   Microalb Creat Ratio 1.5 0.0 - 30.0 mg/g  Lipid panel     Status: Abnormal   Collection Time: 09/24/22  2:51 PM  Result Value Ref Range   Cholesterol 202 (H) 0 - 200 mg/dL    Comment: ATP III Classification       Desirable:  < 200 mg/dL               Borderline High:  200 - 239 mg/dL          High:  > = 865 mg/dL   Triglycerides 784.6 (H) 0.0 - 149.0 mg/dL    Comment: Normal:  <962 mg/dLBorderline High:  150 - 199 mg/dL   HDL  35.30 (L) >39.00 mg/dL   VLDL 42.5 (H) 0.0 - 95.6 mg/dL   Total CHOL/HDL Ratio 6     Comment:                Men          Women1/2 Average Risk     3.4          3.3Average Risk          5.0          4.42X Average Risk          9.6          7.13X Average Risk          15.0          11.0                       NonHDL 167.13     Comment: NOTE:  Non-HDL goal should be 30 mg/dL higher than patient's LDL goal (i.e. LDL goal of < 70 mg/dL, would have non-HDL goal of < 100 mg/dL)  Comprehensive metabolic panel     Status: Abnormal   Collection Time: 09/24/22  2:51 PM  Result Value Ref Range   Sodium 132 (L) 135 - 145 mEq/L   Potassium 4.2 3.5 - 5.1 mEq/L   Chloride 97 96 - 112 mEq/L   CO2 27 19 - 32 mEq/L   Glucose, Bld 223 (H) 70 - 99 mg/dL   BUN 7 6 - 23 mg/dL   Creatinine, Ser 3.87 0.40 - 1.20 mg/dL   Total Bilirubin 0.9 0.2 - 1.2 mg/dL   Alkaline Phosphatase 134 (H) 39 - 117 U/L   AST 54 (H) 0 - 37 U/L   ALT 38 (H) 0 - 35 U/L   Total Protein 7.8 6.0 - 8.3 g/dL   Albumin 4.0 3.5 - 5.2 g/dL   GFR 564.33 >29.51 mL/min    Comment: Calculated using the CKD-EPI Creatinine Equation (2021)   Calcium 9.0 8.4 - 10.5 mg/dL  CBC with Differential/Platelet     Status: Abnormal   Collection Time: 09/24/22  2:51 PM  Result  Value Ref Range   WBC 12.0 (H) 4.0 - 10.5 K/uL   RBC 3.73 (L) 3.87 - 5.11 Mil/uL   Hemoglobin 14.2 12.0 - 15.0 g/dL   HCT 88.4 16.6 - 06.3 %   MCV 115.2 Repeated and verified X2. (H) 78.0 - 100.0 fl   MCHC 33.0 30.0 - 36.0 g/dL   RDW 01.6 01.0 - 93.2 %   Platelets 234.0 150.0 - 400.0 K/uL   Neutrophils Relative % 55.1 43.0 - 77.0 %   Lymphocytes Relative 38.1 12.0 - 46.0 %   Monocytes Relative 3.9 3.0 - 12.0 %   Eosinophils Relative 2.2 0.0 - 5.0 %   Basophils Relative 0.7 0.0 - 3.0 %   Neutro Abs 6.6 1.4 - 7.7 K/uL   Lymphs Abs 4.6 (H) 0.7 - 4.0 K/uL   Monocytes Absolute 0.5 0.1 - 1.0 K/uL   Eosinophils Absolute 0.3 0.0 - 0.7 K/uL   Basophils Absolute 0.1 0.0 - 0.1 K/uL  TSH     Status: None   Collection Time: 09/24/22  2:51 PM  Result Value Ref Range   TSH 2.24 0.35 - 5.50 uIU/mL  Hemoglobin A1c     Status: Abnormal   Collection Time: 09/24/22  2:51 PM  Result Value Ref Range   Hgb A1c MFr Bld 9.7 (H) 4.6 - 6.5 %  Comment: Glycemic Control Guidelines for People with Diabetes:Non Diabetic:  <6%Goal of Therapy: <7%Additional Action Suggested:  >8%   VITAMIN D 25 Hydroxy (Vit-D Deficiency, Fractures)     Status: Abnormal   Collection Time: 09/24/22  2:51 PM  Result Value Ref Range   VITD 29.76 (L) 30.00 - 100.00 ng/mL  LDL cholesterol, direct     Status: None   Collection Time: 09/24/22  2:51 PM  Result Value Ref Range   Direct LDL 132.0 mg/dL    Comment: Optimal:  <562 mg/dLNear or Above Optimal:  100-129 mg/dLBorderline High:  130-159 mg/dLHigh:  160-189 mg/dLVery High:  >190 mg/dL  Basic metabolic panel     Status: Abnormal   Collection Time: 10/02/22  3:13 PM  Result Value Ref Range   Sodium 130 (L) 135 - 145 mEq/L   Potassium 4.0 3.5 - 5.1 mEq/L   Chloride 97 96 - 112 mEq/L   CO2 26 19 - 32 mEq/L   Glucose, Bld 246 (H) 70 - 99 mg/dL   BUN 5 (L) 6 - 23 mg/dL   Creatinine, Ser 1.30 0.40 - 1.20 mg/dL   GFR 865.78 >46.96 mL/min    Comment: Calculated using the  CKD-EPI Creatinine Equation (2021)   Calcium 9.0 8.4 - 10.5 mg/dL  CBC with Differential/Platelet     Status: Abnormal   Collection Time: 10/02/22  3:13 PM  Result Value Ref Range   WBC 11.8 (H) 4.0 - 10.5 K/uL   RBC 3.67 (L) 3.87 - 5.11 Mil/uL   Hemoglobin 13.9 12.0 - 15.0 g/dL   HCT 29.5 28.4 - 13.2 %   MCV 115.2 Repeated and verified X2. (H) 78.0 - 100.0 fl   MCHC 32.9 30.0 - 36.0 g/dL   RDW 44.0 10.2 - 72.5 %   Platelets 234.0 150.0 - 400.0 K/uL   Neutrophils Relative % 53.7 43.0 - 77.0 %   Lymphocytes Relative 39.4 12.0 - 46.0 %   Monocytes Relative 3.7 3.0 - 12.0 %   Eosinophils Relative 2.2 0.0 - 5.0 %   Basophils Relative 1.0 0.0 - 3.0 %   Neutro Abs 6.4 1.4 - 7.7 K/uL   Lymphs Abs 4.7 (H) 0.7 - 4.0 K/uL   Monocytes Absolute 0.4 0.1 - 1.0 K/uL   Eosinophils Absolute 0.3 0.0 - 0.7 K/uL   Basophils Absolute 0.1 0.0 - 0.1 K/uL  POCT urinalysis dipstick     Status: Abnormal   Collection Time: 10/21/22  2:52 PM  Result Value Ref Range   Color, UA DARK    Clarity, UA     Glucose, UA Positive (A) Negative    Comment: 250 mg/dL   Bilirubin, UA postive     Comment: 1+   Ketones, UA neg    Spec Grav, UA >=1.030 (A) 1.010 - 1.025    Comment: 1.030   Blood, UA neg    pH, UA 6.0 5.0 - 8.0   Protein, UA Positive (A) Negative    Comment: 15 mg/dL   Urobilinogen, UA 0.2 0.2 or 1.0 E.U./dL   Nitrite, UA NEG    Leukocytes, UA Moderate (2+) (A) Negative    Comment: 125   Appearance     Odor    Urine Culture     Status: None   Collection Time: 10/21/22  3:26 PM   Specimen: Urine  Result Value Ref Range   MICRO NUMBER: [id number]    SPECIMEN QUALITY: Adequate    Sample Source URINE    STATUS: FINAL  Result: No Growth   POCT urinalysis dipstick     Status: Abnormal   Collection Time: 10/27/22  2:10 PM  Result Value Ref Range   Color, UA     Clarity, UA     Glucose, UA Positive (A) Negative    Comment: 3+   Bilirubin, UA NEG    Ketones, UA NEG    Spec Grav, UA 1.010  1.010 - 1.025   Blood, UA 3+    pH, UA 6.0 5.0 - 8.0   Protein, UA Negative Negative   Urobilinogen, UA 0.2 0.2 or 1.0 E.U./dL   Nitrite, UA NEG    Leukocytes, UA Large (3+) (A) Negative    Comment: 15   Appearance     Odor    Urine Culture     Status: None   Collection Time: 10/27/22  2:57 PM   Specimen: GYN  Result Value Ref Range   MICRO NUMBER: [id number]    SPECIMEN QUALITY: Adequate    Sample Source URINE    STATUS: FINAL    Result:      Less than 10,000 CFU/mL of single Gram positive organism isolated. No further testing will be performed. If clinically indicated, recollection using a method to minimize contamination, with prompt transfer to Urine Culture Transport Tube, is recommended.  NuSwab Vaginitis Plus (VG+)     Status: Abnormal   Collection Time: 10/27/22  2:57 PM  Result Value Ref Range   Atopobium vaginae Moderate - 1 Score   BVAB 2 High - 2 (A) Score   Megasphaera 1 High - 2 (A) Score    Comment: Calculate total score by adding the 3 individual bacterial vaginosis (BV) marker scores together.  Total score is interpreted as follows: Total score 0-1: Indicates the absence of BV. Total score   2: Indeterminate for BV. Additional clinical                  data should be evaluated to establish a                  diagnosis. Total score 3-6: Indicates the presence of BV.    Candida albicans, NAA Negative Negative   Candida glabrata, NAA Negative Negative   Trich vag by NAA Negative Negative   Chlamydia trachomatis, NAA Negative Negative   Neisseria gonorrhoeae, NAA Negative Negative  Vitamin B12     Status: None   Collection Time: 10/27/22  2:57 PM  Result Value Ref Range   Vitamin B-12 291 211 - 911 pg/mL  Comprehensive metabolic panel     Status: Abnormal   Collection Time: 10/27/22  2:57 PM  Result Value Ref Range   Sodium 128 (L) 135 - 145 mEq/L   Potassium 4.1 3.5 - 5.1 mEq/L   Chloride 95 (L) 96 - 112 mEq/L   CO2 26 19 - 32 mEq/L   Glucose, Bld 334  (H) 70 - 99 mg/dL   BUN 5 (L) 6 - 23 mg/dL   Creatinine, Ser 5.40 0.40 - 1.20 mg/dL   Total Bilirubin 0.8 0.2 - 1.2 mg/dL   Alkaline Phosphatase 146 (H) 39 - 117 U/L   AST 52 (H) 0 - 37 U/L   ALT 37 (H) 0 - 35 U/L   Total Protein 7.4 6.0 - 8.3 g/dL   Albumin 3.9 3.5 - 5.2 g/dL   GFR 981.19 >14.78 mL/min    Comment: Calculated using the CKD-EPI Creatinine Equation (2021)   Calcium 9.0 8.4 - 10.5 mg/dL  Comprehensive metabolic panel     Status: Abnormal   Collection Time: 12/01/22  2:26 PM  Result Value Ref Range   Sodium 126 (L) 135 - 145 mEq/L   Potassium 3.9 3.5 - 5.1 mEq/L   Chloride 94 (L) 96 - 112 mEq/L   CO2 25 19 - 32 mEq/L   Glucose, Bld 311 (H) 70 - 99 mg/dL   BUN 6 6 - 23 mg/dL   Creatinine, Ser 9.14 0.40 - 1.20 mg/dL   Total Bilirubin 0.6 0.2 - 1.2 mg/dL   Alkaline Phosphatase 143 (H) 39 - 117 U/L   AST 49 (H) 0 - 37 U/L   ALT 38 (H) 0 - 35 U/L   Total Protein 7.6 6.0 - 8.3 g/dL   Albumin 3.9 3.5 - 5.2 g/dL   GFR 782.95 >62.13 mL/min    Comment: Calculated using the CKD-EPI Creatinine Equation (2021)   Calcium 8.9 8.4 - 10.5 mg/dL         has a past medical history of Abnormal CT of liver, Anxiety, Asthma, Asthma, Bipolar 1 disorder (HCC), Bipolar disorder (HCC), Chronic pain, Cirrhosis (HCC), Depression, Diabetes mellitus without complication (HCC), Fatty liver, Gallstones, GERD (gastroesophageal reflux disease), Headache(784.0), Hyperlipidemia, Hypertension, Hypothyroidism, Pulmonary nodule, Tachycardia, Thyroid disease, and Vitamin D deficiency.   reports that she has been smoking cigarettes. She has a 20 pack-year smoking history. She has never used smokeless tobacco.  Past Surgical History:  Procedure Laterality Date   DILATION AND CURETTAGE OF UTERUS     1 yr ago   UPPER GI ENDOSCOPY  08/28/2022    Allergies  Allergen Reactions   Bactrim [Sulfamethoxazole-Trimethoprim] Anaphylaxis and Other (See Comments)    Chest pain, trouble breathing    Ciprofloxacin Palpitations   Clindamycin/Lincomycin     Other reaction(s): Chest Pain   Flagyl [Metronidazole] Shortness Of Breath   Iodinated Contrast Media Swelling   Ondansetron Shortness Of Breath   Liraglutide Nausea And Vomiting   Doxycycline Nausea And Vomiting   Penicillins    Prednisone Other (See Comments)    Makes her angry and irritable    Immunization History  Administered Date(s) Administered   Influenza Split 03/22/2014   Influenza Whole 01/13/2008   Influenza, Seasonal, Injecte, Preservative Fre 04/18/2015   Influenza,inj,Quad PF,6+ Mos 04/18/2015, 01/08/2022   PFIZER(Purple Top)SARS-COV-2 Vaccination 03/12/2020, 04/16/2020   Pneumococcal Polysaccharide-23 05/10/2012   Tdap 01/02/2006, 08/08/2015    Family History  Problem Relation Age of Onset   Asthma Mother    Valvular heart disease Father        Mitral valve repair Dr. Cornelius Moras 2007   Diabetes Maternal Grandmother    Hypertension Maternal Grandmother      Current Outpatient Medications:    ACCU-CHEK AVIVA PLUS test strip, 1 each by Other route 3 (three) times daily., Disp: , Rfl:    ACCU-CHEK FASTCLIX LANCETS MISC, USE AS DIRECTED, Disp: 102 each, Rfl: 3   albuterol (VENTOLIN HFA) 108 (90 Base) MCG/ACT inhaler, Inhale 2 puffs into the lungs every 6 (six) hours as needed for wheezing or shortness of breath., Disp: 18 g, Rfl: 1   amitriptyline (ELAVIL) 25 MG tablet, Take 50 mg by mouth at bedtime., Disp: , Rfl:    amLODipine (NORVASC) 5 MG tablet, Take 1 tablet (5 mg total) by mouth daily., Disp: 90 tablet, Rfl: 3   atorvastatin (LIPITOR) 40 MG tablet, Take 40 mg by mouth daily., Disp: , Rfl:    benzonatate (TESSALON) 200 MG capsule, Take 1 capsule (200 mg  total) by mouth 3 (three) times daily as needed., Disp: 45 capsule, Rfl: 1   Blood Glucose Monitoring Suppl (ACCU-CHEK AVIVA PLUS) w/Device KIT, USE AS DIRECTED, Disp: 1 kit, Rfl: 0   clonazePAM (KLONOPIN) 0.5 MG disintegrating tablet, Take 0.5 mg by mouth  daily as needed., Disp: , Rfl:    fluticasone-salmeterol (ADVAIR) 250-50 MCG/ACT AEPB, Inhale 1 puff into the lungs every 12 (twelve) hours. Rinse after use., Disp: 60 each, Rfl: 5   HUMALOG KWIKPEN 100 UNIT/ML KwikPen, Inject 10 Units into the skin 3 (three) times daily. SUB-Q 3 Times Daily, Disp: 15 mL, Rfl: 2   hydrOXYzine (ATARAX) 25 MG tablet, SMARTSIG:1 Tablet(s) By Mouth Every Evening, Disp: , Rfl:    INCRUSE ELLIPTA 62.5 MCG/ACT AEPB, SMARTSIG:2 Puff(s) Via Inhaler Daily, Disp: , Rfl:    ipratropium-albuterol (DUONEB) 0.5-2.5 (3) MG/3ML SOLN, Take 3 mLs by nebulization in the morning, at noon, and at bedtime., Disp: 270 mL, Rfl: 11   ketorolac (TORADOL) 10 MG tablet, Take 1 tablet (10 mg total) by mouth every 8 (eight) hours as needed for up to 5 days., Disp: 15 tablet, Rfl: 0   lamoTRIgine (LAMICTAL) 150 MG tablet, Take 150 mg by mouth daily., Disp: , Rfl:    levothyroxine (SYNTHROID) 75 MCG tablet, Take 1 tablet (75 mcg total) by mouth daily before breakfast., Disp: 90 tablet, Rfl: 0   losartan (COZAAR) 50 MG tablet, Take 1 tablet (50 mg total) by mouth daily., Disp: 30 tablet, Rfl: 0   montelukast (SINGULAIR) 10 MG tablet, Take 1 tablet (10 mg total) by mouth at bedtime., Disp: 90 tablet, Rfl: 3   Nebulizers (COMPRESSOR/NEBULIZER) MISC, Use as directed, Disp: 1 each, Rfl: 0   nitroGLYCERIN (NITROSTAT) 0.4 MG SL tablet, Place 1 tablet (0.4 mg total) under the tongue every 5 (five) minutes as needed for chest pain., Disp: 25 tablet, Rfl: 3   omeprazole (PRILOSEC) 40 MG capsule, Take 40 mg by mouth every morning., Disp: , Rfl:    PAXIL 20 MG tablet, Take 20 mg by mouth daily., Disp: , Rfl:    pioglitazone (ACTOS) 15 MG tablet, Take 1 tablet (15 mg total) by mouth daily., Disp: 30 tablet, Rfl: 0   promethazine (PHENERGAN) 12.5 MG tablet, Take 12.5 mg by mouth every 8 (eight) hours as needed., Disp: , Rfl:    propranolol (INDERAL) 20 MG tablet, TAKE 1 TABLET BY MOUTH THREE TIMES A DAY, Disp:  270 tablet, Rfl: 2   Respiratory Therapy Supplies (NEBULIZER/TUBING/MOUTHPIECE) KIT, Use as directed, Disp: 10 kit, Rfl: 11   tinidazole (TINDAMAX) 500 MG tablet, Take 4 tablets for 5 days, Disp: 20 tablet, Rfl: 0   traZODone (DESYREL) 50 MG tablet, Take 50-100 mg by mouth at bedtime., Disp: , Rfl:    ziprasidone (GEODON) 60 MG capsule, Take 60 mg by mouth daily., Disp: , Rfl:    zolpidem (AMBIEN) 5 MG tablet, Take by mouth., Disp: , Rfl:       Objective:   Vitals:   12/12/22 1057  BP: 110/80  Pulse: 83  SpO2: 96%  Weight: 200 lb 9.6 oz (91 kg)  Height: 5\' 3"  (1.6 m)    Estimated body mass index is 35.53 kg/m as calculated from the following:   Height as of this encounter: 5\' 3"  (1.6 m).   Weight as of this encounter: 200 lb 9.6 oz (91 kg).  @WEIGHTCHANGE @  [employer]   12/12/22 1057  Weight: 200 lb 9.6 oz (91 kg)     Physical  Exam   General: No distress. Looks wel O2 at rest: no Cane present: no Sitting in wheel chair: no Frail: no Obese: YES Neuro: Alert and Oriented x 3. GCS 15. Speech normal Psych: Pleasant Resp:  Barrel Chest - no.  Wheeze - no, Crackles - no, No overt respiratory distress CVS: Normal heart sounds. Murmurs - no Ext: Stigmata of Connective Tissue Disease - no HEENT: Normal upper airway. PEERL +. No post nasal drip        Assessment:       ICD-10-CM   1. Multiple lung nodules on CT  R91.8     2. TOBACCO ABUSE  F17.200     3. Cardiomegaly  I51.7     4. Coronary artery calcification seen on CAT scan  I25.10     5. Other cirrhosis of liver (HCC)  K74.69     6. Asthma, unspecified asthma severity, unspecified whether complicated, unspecified whether persistent  J45.909          Plan:     Patient Instructions  Multiple lung nodules on CT  - newer nodules in 2024 June compared to 2022  Plan  - repeat CT chest wo contrast in 8 months  TOBACCO ABUSE  - glad you want to quit  Plan  -Cut down smoking by 1 cigarette each  day so that you are completely quit in 3 weeks --  start chantix as directed  - Days 1-3: 0.5 mg once daily  - Days 4-7: 0.5 mg twice daily  - Maintenance (? Day 8):  1 mg twice daily for 11 weeks   - if you have bad dream stop medication and call us  - take the medicaiton as instructed and after food - quit smoking  3 week after starting chantix   as described above   Asthma  -Currently well-controlled plan - Continue current inhalers  Cardiomegaly noted on CT chest June 2024 Coronary artery calcification seen on CAT scan -June 2024 with negative stress test 2022  -Last echocardiogram was in 2023  Plan - Repeat echocardiogram  Other cirrhosis of liver (HCC)  -New problem and being managed [facility]  Plan - Continue support by GI services - Talk to primary care physician start yourself on weight loss drugs  Follow-up - 4 to 6 weeks video visit with nurse practitioner - 23-month visit with Dr. Marchelle Gearing after CT scan of the chest   FOLLOWUP Return for 4 weeks with APP and 8  monthsiwtih Dr Marchelle Gearing 15 min vsit after CT.    SIGNATURE    Dr. Kalman Shan, M.D., F.C.C.P,  Pulmonary and Critical Care Medicine Staff Physician, [facility] Director - Interstitial Lung Disease  Program  Pulmonary Fibrosis [facility] Network at [facility] [place], [place], [address]  Pager: [phone], If no answer or between  15:00h - 7:00h: call [phone] Telephone: [phone]  11:44 AM 12/12/2022

## 2022-12-16 ENCOUNTER — Encounter: Payer: Self-pay | Admitting: Internal Medicine

## 2022-12-16 ENCOUNTER — Ambulatory Visit (HOSPITAL_COMMUNITY): Payer: MEDICAID | Attending: Internal Medicine

## 2022-12-16 DIAGNOSIS — I517 Cardiomegaly: Secondary | ICD-10-CM | POA: Insufficient documentation

## 2022-12-16 LAB — ECHOCARDIOGRAM COMPLETE
Area-P 1/2: 4.68 cm2
S' Lateral: 3.4 cm

## 2022-12-17 ENCOUNTER — Encounter: Payer: Self-pay | Admitting: Cardiology

## 2022-12-17 ENCOUNTER — Telehealth: Payer: Self-pay | Admitting: Internal Medicine

## 2022-12-18 NOTE — Telephone Encounter (Signed)
Dr. Marchelle Gearing can you please advise on Echo results?

## 2022-12-19 NOTE — Telephone Encounter (Signed)
Echo is normal and message sent directly to patient

## 2022-12-20 ENCOUNTER — Other Ambulatory Visit: Payer: Self-pay | Admitting: Family Medicine

## 2022-12-20 DIAGNOSIS — E039 Hypothyroidism, unspecified: Secondary | ICD-10-CM

## 2022-12-23 ENCOUNTER — Other Ambulatory Visit: Payer: Self-pay | Admitting: Family Medicine

## 2022-12-23 DIAGNOSIS — E118 Type 2 diabetes mellitus with unspecified complications: Secondary | ICD-10-CM

## 2022-12-28 ENCOUNTER — Encounter: Payer: Self-pay | Admitting: Family Medicine

## 2022-12-29 ENCOUNTER — Ambulatory Visit: Payer: MEDICAID | Admitting: Family Medicine

## 2022-12-29 VITALS — BP 124/78 | HR 65 | Temp 98.3°F | Ht 63.0 in | Wt 200.1 lb

## 2022-12-29 DIAGNOSIS — N390 Urinary tract infection, site not specified: Secondary | ICD-10-CM

## 2022-12-29 DIAGNOSIS — J069 Acute upper respiratory infection, unspecified: Secondary | ICD-10-CM | POA: Diagnosis not present

## 2022-12-29 DIAGNOSIS — R0981 Nasal congestion: Secondary | ICD-10-CM | POA: Diagnosis not present

## 2022-12-29 LAB — POCT URINALYSIS DIPSTICK
Bilirubin, UA: NEGATIVE
Blood, UA: NEGATIVE
Glucose, UA: POSITIVE — AB
Ketones, UA: NEGATIVE
Nitrite, UA: NEGATIVE
Protein, UA: NEGATIVE
Spec Grav, UA: 1.015 (ref 1.010–1.025)
Urobilinogen, UA: 0.2 U/dL
pH, UA: 6 (ref 5.0–8.0)

## 2022-12-29 LAB — POC COVID19 BINAXNOW: SARS Coronavirus 2 Ag: NEGATIVE

## 2022-12-29 MED ORDER — NITROFURANTOIN MONOHYD MACRO 100 MG PO CAPS
100.0000 mg | ORAL_CAPSULE | Freq: Two times a day (BID) | ORAL | 0 refills | Status: AC
Start: 2022-12-29 — End: 2023-01-03

## 2022-12-29 NOTE — Progress Notes (Unsigned)
   Acute Office Visit   Subjective:  Patient ID: Emily [name], female    DOB: 1980/12/18, 42 y.o.   MRN: [id number]  Chief Complaint  Patient presents with   Hematuria    Pt is here today with C/O of blood in urine. Pt reports nasal and headache. Sx for 2 days.    HPI Patient has multiple complaints. She is complaining of hematuria that started a few days ago. Also, complains of dysuria, increase in urgency/ frequency, nausea, and lower back pain. Denies abd pain, fever, vomiting, or diarrhea.   Patient reports she has had nasal congestion and frontal headache for the last two day. Denies sore throat, ear pain, or drainage. Usually cough, but not changed.   ROS See HPI above      Objective:   BP 124/78   Pulse 65   Temp 98.3 F (36.8 C)   Ht 5\' 3"  (1.6 m)   Wt 200 lb 2 oz (90.8 kg)   SpO2 97%   BMI 35.45 kg/m  {Vitals History (Optional):23777}  Physical Exam  Results for orders placed or performed in visit on 12/29/22  POCT urinalysis dipstick  Result Value Ref Range   Color, UA     Clarity, UA     Glucose, UA Positive (A) Negative   Bilirubin, UA NEG    Ketones, UA NEG    Spec Grav, UA 1.015 1.010 - 1.025   Blood, UA NEG    pH, UA 6.0 5.0 - 8.0   Protein, UA Negative Negative   Urobilinogen, UA 0.2 0.2 or 1.0 E.U./dL   Nitrite, UA NEG    Leukocytes, UA 4+ (A) Negative   Appearance     Odor    POC COVID-19  Result Value Ref Range   SARS Coronavirus 2 Ag Negative Negative        Assessment & Plan:  Nasal congestion -     POC COVID-19 BinaxNow  Urinary tract infection without hematuria, site unspecified -     Urine Culture -     POCT urinalysis dipstick    No follow-ups on file.  Zandra Abts, NP

## 2022-12-30 ENCOUNTER — Ambulatory Visit: Payer: MEDICAID | Admitting: Surgery

## 2022-12-30 LAB — URINE CULTURE
MICRO NUMBER:: 15440490
SPECIMEN QUALITY:: ADEQUATE

## 2022-12-31 ENCOUNTER — Telehealth: Payer: Self-pay

## 2022-12-31 ENCOUNTER — Telehealth: Payer: MEDICAID | Admitting: Adult Health

## 2022-12-31 ENCOUNTER — Other Ambulatory Visit: Payer: Self-pay

## 2022-12-31 DIAGNOSIS — R300 Dysuria: Secondary | ICD-10-CM

## 2022-12-31 NOTE — Telephone Encounter (Signed)
error 

## 2022-12-31 NOTE — Telephone Encounter (Signed)
Pt has reviewed via MyChart

## 2022-12-31 NOTE — Telephone Encounter (Signed)
-----   Message from Zandra Abts sent at 12/31/2022 10:35 AM EDT ----- Your urine culture came back mixed flora. You do not need to finish antibiotic. With you having frequent urinary symptoms, recommend a urology referral. (Referral: Urology; Dx: Dysuria)

## 2023-01-02 ENCOUNTER — Ambulatory Visit: Payer: MEDICAID | Admitting: Family Medicine

## 2023-01-06 ENCOUNTER — Other Ambulatory Visit: Payer: Self-pay

## 2023-01-06 DIAGNOSIS — Z794 Long term (current) use of insulin: Secondary | ICD-10-CM

## 2023-01-09 ENCOUNTER — Other Ambulatory Visit: Payer: Self-pay | Admitting: Adult Health

## 2023-01-09 MED ORDER — AZITHROMYCIN 250 MG PO TABS: ORAL_TABLET | ORAL | 0 refills | Status: DC

## 2023-01-09 NOTE — Progress Notes (Signed)
Rx azithromycin

## 2023-01-10 DIAGNOSIS — K824 Cholesterolosis of gallbladder: Secondary | ICD-10-CM | POA: Diagnosis not present

## 2023-01-10 DIAGNOSIS — R161 Splenomegaly, not elsewhere classified: Secondary | ICD-10-CM | POA: Diagnosis not present

## 2023-01-10 DIAGNOSIS — K760 Fatty (change of) liver, not elsewhere classified: Secondary | ICD-10-CM | POA: Diagnosis not present

## 2023-01-14 ENCOUNTER — Encounter: Payer: Self-pay | Admitting: Family Medicine

## 2023-01-14 LAB — HM DIABETES EYE EXAM

## 2023-01-16 ENCOUNTER — Other Ambulatory Visit (INDEPENDENT_AMBULATORY_CARE_PROVIDER_SITE_OTHER): Payer: Self-pay

## 2023-01-16 DIAGNOSIS — E1165 Type 2 diabetes mellitus with hyperglycemia: Secondary | ICD-10-CM

## 2023-01-16 DIAGNOSIS — Z794 Long term (current) use of insulin: Secondary | ICD-10-CM

## 2023-01-16 LAB — COMPREHENSIVE METABOLIC PANEL
ALT: 42 U/L — ABNORMAL HIGH (ref 0–35)
AST: 57 U/L — ABNORMAL HIGH (ref 0–37)
Albumin: 3.9 g/dL (ref 3.5–5.2)
Alkaline Phosphatase: 149 U/L — ABNORMAL HIGH (ref 39–117)
BUN: 5 mg/dL — ABNORMAL LOW (ref 6–23)
CO2: 28 meq/L (ref 19–32)
Calcium: 9 mg/dL (ref 8.4–10.5)
Chloride: 97 meq/L (ref 96–112)
Creatinine, Ser: 0.64 mg/dL (ref 0.40–1.20)
GFR: 109.97 mL/min (ref >60.00)
Glucose, Bld: 251 mg/dL — ABNORMAL HIGH (ref 70–99)
Potassium: 4 meq/L (ref 3.5–5.1)
Sodium: 133 meq/L — ABNORMAL LOW (ref 135–145)
Total Bilirubin: 1.1 mg/dL (ref 0.2–1.2)
Total Protein: 7.5 g/dL (ref 6.0–8.3)

## 2023-01-16 LAB — MICROALBUMIN / CREATININE URINE RATIO
Creatinine,U: 52.2 mg/dL
Microalb Creat Ratio: 1.3 mg/g (ref 0.0–30.0)
Microalb, Ur: <0.7 mg/dL (ref 0.0–1.9)

## 2023-01-16 LAB — LIPID PANEL
Cholesterol: 223 mg/dL — ABNORMAL HIGH (ref 0–200)
HDL: 34.8 mg/dL — ABNORMAL LOW (ref >39.00)
Total CHOL/HDL Ratio: 6
Triglycerides: 411 mg/dL — ABNORMAL HIGH (ref 0.0–149.0)

## 2023-01-16 LAB — LDL CHOLESTEROL, DIRECT: Direct LDL: 128 mg/dL

## 2023-01-16 LAB — HEMOGLOBIN A1C: Hgb A1c MFr Bld: 11 % — ABNORMAL HIGH (ref 4.6–6.5)

## 2023-01-21 ENCOUNTER — Encounter: Payer: Self-pay | Admitting: Family Medicine

## 2023-01-21 NOTE — Progress Notes (Deleted)
Name: Emily [name] DOB: [birth year] MRN: [id number]  History of Present Illness: Ms. Emily [name] is a 42 y.o. female who presents today as a new patient at [facility] [place]. All available relevant medical records have been reviewed.   Recent history: > 10/21/2022: Seen by PCP. - Reported visible hematuria, dysuria, increased urgency & frequency, and low back pain.  - UA positive for protein, leukocytes, and glucose; no blood or nitrites. - Was prescribed Macrobid for suspected UTI, however urine culture came back negative.  > 11/07/2022: CT abdomen/pelvis w/o contrast showed no GU stones, masses, or hydronephrosis.  > 12/29/2022: Seen by PCP. - Reported visible hematuria, dysuria, increased urgency & frequency, nausea, and low back pain.  - UA positive for leukocytes and glucose; no blood or nitrites. - Was prescribed Macrobid for suspected UTI, however urine culture came back negative.  Today: She {Actions; denies-reports:120008} urinary urgency, frequency, dysuria, gross hematuria, straining to void, or sensations of incomplete emptying. She {Actions; denies-reports:120008} ***abdominal or ***flank pain. She {Actions; denies-reports:120008} fevers.  She {Actions; denies-reports:120008} history of kidney stones.  She {Actions; denies-reports:120008} history of pyelonephritis.  She {Actions; denies-reports:120008} history of recent or recurrent UTI. She {Actions; denies-reports:120008} history of GU malignancy or pelvic radiation.  She {Actions; denies-reports:120008} history of autoimmune disease. She reports history of smoking (quit ***; has smoked *** ppd x*** years). She {Actions; denies-reports:120008} known occupational risks. She {Actions; denies-reports:120008} taking anticoagulants (***).  ***gross hematuria workup needed  Fall Screening: Do you usually have a device to assist in your mobility? {yes/no:20286} ***cane / ***walker /  ***wheelchair  Medications: Current Outpatient Medications  Medication Sig Dispense Refill   azithromycin (ZITHROMAX) 250 MG tablet Take 2 now and 1 daily for 4 days 6 tablet 0   ACCU-CHEK AVIVA PLUS test strip 1 each by Other route 3 (three) times daily.     ACCU-CHEK FASTCLIX LANCETS MISC USE AS DIRECTED 102 each 3   albuterol (VENTOLIN HFA) 108 (90 Base) MCG/ACT inhaler Inhale 2 puffs into the lungs every 6 (six) hours as needed for wheezing or shortness of breath. 18 g 1   amitriptyline (ELAVIL) 25 MG tablet Take 50 mg by mouth at bedtime.     amLODipine (NORVASC) 5 MG tablet Take 1 tablet (5 mg total) by mouth daily. 90 tablet 3   atorvastatin (LIPITOR) 40 MG tablet Take 40 mg by mouth daily.     benzonatate (TESSALON) 200 MG capsule Take 1 capsule (200 mg total) by mouth 3 (three) times daily as needed. 45 capsule 1   Blood Glucose Monitoring Suppl (ACCU-CHEK AVIVA PLUS) w/Device KIT USE AS DIRECTED 1 kit 0   clonazePAM (KLONOPIN) 0.5 MG disintegrating tablet Take 0.5 mg by mouth daily as needed.     fluticasone-salmeterol (ADVAIR) 250-50 MCG/ACT AEPB Inhale 1 puff into the lungs every 12 (twelve) hours. Rinse after use. 60 each 5   HUMALOG KWIKPEN 100 UNIT/ML KwikPen Inject 10 Units into the skin 3 (three) times daily. SUB-Q 3 Times Daily 15 mL 2   hydrOXYzine (ATARAX) 25 MG tablet SMARTSIG:1 Tablet(s) By Mouth Every Evening     INCRUSE ELLIPTA 62.5 MCG/ACT AEPB SMARTSIG:2 Puff(s) Via Inhaler Daily     ipratropium-albuterol (DUONEB) 0.5-2.5 (3) MG/3ML SOLN Take 3 mLs by nebulization in the morning, at noon, and at bedtime. 270 mL 11   lamoTRIgine (LAMICTAL) 150 MG tablet Take 150 mg by mouth daily.     levothyroxine (SYNTHROID) 75 MCG tablet TAKE 1 TABLET BY MOUTH DAILY BEFORE  BREAKFAST. 90 tablet 0   losartan (COZAAR) 50 MG tablet Take 1 tablet (50 mg total) by mouth daily. 30 tablet 0   montelukast (SINGULAIR) 10 MG tablet Take 1 tablet (10 mg total) by mouth at bedtime. 90 tablet 3    Nebulizers (COMPRESSOR/NEBULIZER) MISC Use as directed 1 each 0   nitroGLYCERIN (NITROSTAT) 0.4 MG SL tablet Place 1 tablet (0.4 mg total) under the tongue every 5 (five) minutes as needed for chest pain. 25 tablet 3   omeprazole (PRILOSEC) 40 MG capsule Take 40 mg by mouth every morning.     PAXIL 20 MG tablet Take 20 mg by mouth daily.     pioglitazone (ACTOS) 15 MG tablet Take 1 tablet (15 mg total) by mouth daily. 30 tablet 0   promethazine (PHENERGAN) 12.5 MG tablet Take 12.5 mg by mouth every 8 (eight) hours as needed.     propranolol (INDERAL) 20 MG tablet TAKE 1 TABLET BY MOUTH THREE TIMES A DAY 270 tablet 2   Respiratory Therapy Supplies (NEBULIZER/TUBING/MOUTHPIECE) KIT Use as directed 10 kit 11   tinidazole (TINDAMAX) 500 MG tablet Take 4 tablets for 5 days 20 tablet 0   traZODone (DESYREL) 50 MG tablet Take 50-100 mg by mouth at bedtime.     varenicline (CHANTIX) 0.5 MG tablet Take 1 tablet (0.5 mg total) by mouth daily for 3 days, THEN 1 tablet (0.5 mg total) 2 (two) times daily for 7 days, THEN 2 tablets (1 mg total) 2 (two) times daily. 325 tablet 0   ziprasidone (GEODON) 60 MG capsule Take 60 mg by mouth daily.     zolpidem (AMBIEN) 5 MG tablet Take by mouth.     No current facility-administered medications for this visit.    Allergies: Allergies  Allergen Reactions   Bactrim [Sulfamethoxazole-Trimethoprim] Anaphylaxis and Other (See Comments)    Chest pain, trouble breathing   Ciprofloxacin Palpitations   Clindamycin/Lincomycin     Other reaction(s): Chest Pain   Flagyl [Metronidazole] Shortness Of Breath   Iodinated Contrast Media Swelling   Ondansetron Shortness Of Breath   Liraglutide Nausea And Vomiting   Doxycycline Nausea And Vomiting   Penicillins    Prednisone Other (See Comments)    Makes her angry and irritable    Past Medical History:  Diagnosis Date   Abnormal CT of liver    Anxiety    Asthma    Asthma    Bipolar 1 disorder (HCC)    Bipolar  disorder (HCC)    Chronic pain    Cirrhosis (HCC)    Depression    Diabetes mellitus without complication (HCC)    Fatty liver    Gallstones    GERD (gastroesophageal reflux disease)    Headache(784.0)    Hyperlipidemia    Hypertension    Hypothyroidism    Pulmonary nodule    Tachycardia    Thyroid disease    Vitamin D deficiency    Past Surgical History:  Procedure Laterality Date   DILATION AND CURETTAGE OF UTERUS     1 yr ago   UPPER GI ENDOSCOPY  08/28/2022   Family History  Problem Relation Age of Onset   Asthma Mother    Valvular heart disease Father        Mitral valve repair Dr. Cornelius Moras 2007   Diabetes Maternal Grandmother    Hypertension Maternal Grandmother    Social History   Socioeconomic History   Marital status: Single    Spouse name: Not on file  Number of children: 0   Years of education: Not on file   Highest education level: 9th grade  Occupational History   Not on file  Tobacco Use   Smoking status: Every Day    Current packs/day: 1.00    Average packs/day: 1 pack/day for 20.0 years (20.0 ttl pk-yrs)    Types: Cigarettes   Smokeless tobacco: Never   Tobacco comments:    Smoking a pack per day.  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   Drug use: No   Sexual activity: Not Currently    Partners: Male    Birth control/protection: Abstinence  Other Topics Concern   Not on file  Social History Narrative   Not on file   Social Determinants of Health   Financial Resource Strain: Low Risk  (10/02/2022)   Overall Financial Resource Strain (CARDIA)    Difficulty of Paying Living Expenses: Not hard at all  Food Insecurity: No Food Insecurity (10/02/2022)   Hunger Vital Sign    Worried About Running Out of Food in the Last Year: Never true    Ran Out of Food in the Last Year: Never true  Transportation Needs: No Transportation Needs (10/02/2022)   PRAPARE  - Administrator, Civil Service (Medical): No    Lack of Transportation (Non-Medical): No  Physical Activity: Inactive (10/02/2022)   Exercise Vital Sign    Days of Exercise per Week: 0 days    Minutes of Exercise per Session: 0 min  Stress: No Stress Concern Present (10/02/2022)   [employer] of Occupational Health - Occupational Stress Questionnaire    Feeling of Stress : Not at all  Social Connections: Socially Isolated (10/02/2022)   Social Connection and Isolation Panel [NHANES]    Frequency of Communication with Friends and Family: Once a week    Frequency of Social Gatherings with Friends and Family: Once a week    Attends Religious Services: Never    Database administrator or Organizations: No    Attends Banker Meetings: Never    Marital Status: Never married  Intimate Partner Violence: Not At Risk (11/07/2022)   Received from [facility]   HITS    Over the last 12 months how often did your partner physically hurt you?: 1    Over the last 12 months how often did your partner insult you or talk down to you?: 1    Over the last 12 months how often did your partner threaten you with physical harm?: 1    Over the last 12 months how often did your partner scream or curse at you?: 1    SUBJECTIVE  Review of Systems*** Constitutional: Patient denies any unintentional weight loss or change in strength lntegumentary: Patient denies any rashes or pruritus Eyes: Patient denies ***dry eyes ENT: Patient ***denies dry mouth Cardiovascular: Patient denies chest pain or syncope Respiratory: Patient denies shortness of breath Gastrointestinal: Patient ***denies nausea, vomiting, constipation, or diarrhea Musculoskeletal: Patient denies muscle cramps or weakness Neurologic: Patient denies convulsions or seizures Allergic/Immunologic: Patient denies recent allergic reaction(s) Hematologic/Lymphatic: Patient denies bleeding tendencies Endocrine: Patient  denies heat/cold intolerance  GU: As per HPI.  OBJECTIVE There were no vitals filed for this visit. There is no height or weight on file to calculate BMI.  Physical Examination*** Constitutional: No obvious distress; patient is non-toxic appearing  Cardiovascular:  No visible lower extremity edema.  Respiratory: The patient does not have audible wheezing/stridor; respirations do not appear labored  Gastrointestinal: Abdomen non-distended Musculoskeletal: Normal ROM of UEs  Skin: No obvious rashes/open sores  Neurologic: CN 2-12 grossly intact Psychiatric: Answered questions appropriately with normal affect  Hematologic/Lymphatic/Immunologic: No obvious bruises or sites of spontaneous bleeding  UA: ***negative *** WBC/hpf, *** RBC/hpf, bacteria (***) PVR: *** ml  ASSESSMENT Dysuria ***  Will plan for follow up in *** months or sooner if needed. Pt verbalized understanding and agreement. All questions were answered.  PLAN Advised the following: *** ***No follow-ups on file.  No orders of the defined types were placed in this encounter.   It has been explained that the patient is to follow regularly with their PCP in addition to all other providers involved in their care and to follow instructions provided by these respective offices. Patient advised to contact urology clinic if any urologic-pertaining questions, concerns, new symptoms or problems arise in the interim period.  There are no Patient Instructions on file for this visit.  Electronically signed by:  Donnita Falls, MSN, FNP-C, CUNP 01/21/2023 12:37 PM

## 2023-01-22 ENCOUNTER — Encounter: Payer: Self-pay | Admitting: Internal Medicine

## 2023-01-22 ENCOUNTER — Encounter: Payer: Self-pay | Admitting: "Endocrinology

## 2023-01-22 ENCOUNTER — Other Ambulatory Visit: Payer: Self-pay | Admitting: *Deleted

## 2023-01-22 ENCOUNTER — Telehealth: Payer: Self-pay | Admitting: "Endocrinology

## 2023-01-22 ENCOUNTER — Ambulatory Visit: Payer: Medicaid Other | Admitting: "Endocrinology

## 2023-01-22 VITALS — BP 120/80 | HR 82 | Ht 63.0 in | Wt 198.6 lb

## 2023-01-22 DIAGNOSIS — E1165 Type 2 diabetes mellitus with hyperglycemia: Secondary | ICD-10-CM

## 2023-01-22 DIAGNOSIS — Z794 Long term (current) use of insulin: Secondary | ICD-10-CM | POA: Diagnosis not present

## 2023-01-22 DIAGNOSIS — E782 Mixed hyperlipidemia: Secondary | ICD-10-CM | POA: Diagnosis not present

## 2023-01-22 DIAGNOSIS — J45909 Unspecified asthma, uncomplicated: Secondary | ICD-10-CM

## 2023-01-22 LAB — BASIC METABOLIC PANEL
BUN: 5 mg/dL — ABNORMAL LOW (ref 6–23)
CO2: 26 meq/L (ref 19–32)
Calcium: 8.8 mg/dL (ref 8.4–10.5)
Chloride: 96 meq/L (ref 96–112)
Creatinine, Ser: 0.68 mg/dL (ref 0.40–1.20)
GFR: 108.36 mL/min (ref >60.00)
Glucose, Bld: 319 mg/dL — ABNORMAL HIGH (ref 70–99)
Potassium: 4.3 meq/L (ref 3.5–5.1)
Sodium: 129 meq/L — ABNORMAL LOW (ref 135–145)

## 2023-01-22 MED ORDER — DEXCOM G7 SENSOR MISC: 1.0000 | 0 refills | Status: DC

## 2023-01-22 MED ORDER — BLOOD GLUCOSE MONITORING SUPPL DEVI: 1.0000 | Freq: Three times a day (TID) | 0 refills | Status: AC

## 2023-01-22 MED ORDER — BLOOD GLUCOSE TEST VI STRP: 1.0000 | ORAL_STRIP | Freq: Three times a day (TID) | 3 refills | Status: AC

## 2023-01-22 MED ORDER — LANCETS MISC. MISC: 1.0000 | Freq: Three times a day (TID) | 3 refills | Status: AC

## 2023-01-22 MED ORDER — DEXCOM G7 RECEIVER DEVI: 1.0000 | 0 refills | Status: DC

## 2023-01-22 MED ORDER — LANCET DEVICE MISC: 1.0000 | Freq: Three times a day (TID) | 0 refills | Status: AC

## 2023-01-22 NOTE — Progress Notes (Signed)
Outpatient Endocrinology Note [place], MD  01/22/23   Emily [name] Feb 27, 1981 [id number]  Referring Provider: Alveria Apley, NP Primary Care Provider: Alveria Apley, NP Reason for consultation: Subjective   Assessment & Plan  Diagnoses and all orders for this visit:  Uncontrolled type 2 diabetes mellitus with hyperglycemia (HCC) -     Ambulatory referral to diabetic education -     C-peptide; Future -     GAD65, IA-2, and Insulin Autoantibody serum; Future -     Basic metabolic panel; Future  Long-term insulin use (HCC)  Mixed hypercholesterolemia and hypertriglyceridemia  Other orders -     Blood Glucose Monitoring Suppl DEVI; 1 each by Does not apply route in the morning, at noon, and at bedtime. May substitute to any manufacturer covered by patient's insurance. -     Glucose Blood (BLOOD GLUCOSE TEST STRIPS) STRP; 1 each by In Vitro route in the morning, at noon, and at bedtime. May substitute to any manufacturer covered by patient's insurance. -     Lancet Device MISC; 1 each by Does not apply route in the morning, at noon, and at bedtime. May substitute to any manufacturer covered by patient's insurance. -     Lancets Misc. MISC; 1 each by Does not apply route in the morning, at noon, and at bedtime. May substitute to any manufacturer covered by patient's insurance. -     Continuous Glucose Receiver (DEXCOM G7 RECEIVER) DEVI; 1 Device by Does not apply route continuous. -     Continuous Glucose Sensor (DEXCOM G7 SENSOR) MISC; 1 Device by Does not apply route continuous.    Diabetes Type II complicated by neuropathy Lab Results  Component Value Date   GFR 109.97 01/16/2023   Hba1c goal less than 7, current Hba1c is  Lab Results  Component Value Date   HGBA1C 11.0 (H) 01/16/2023   Will recommend the following: Humalog 10 units tidac, 15 min before meals Check BG tidac/start dexcom; ordered both Retun in 2 weeks for dose adjustment    No known contraindications/side effects to any of above medications  -Last LD and Tg are as follows: Lab Results  Component Value Date   LDLCALC 102 (H) 07/16/2021    Lab Results  Component Value Date   TRIG (H) 01/16/2023    411.0 Triglyceride is over 400; calculations on Lipids are invalid.   -not on statin (has cirrhosis per pt, atorvastatin 40 mg was stopped by provider) -Follow low fat diet and exercise   -Blood pressure goal <140/90 - Microalbumin/creatinine goal is < 30 -Last MA/Cr is as follows: Lab Results  Component Value Date   MICROALBUR <0.7 01/16/2023   -not on ACE/ARB  -diet changes including salt restriction -limit eating outside -counseled BP targets per standards of diabetes care -uncontrolled blood pressure can lead to retinopathy, nephropathy and cardiovascular and atherosclerotic heart disease  Reviewed and counseled on: -A1C target -Blood sugar targets -Complications of uncontrolled diabetes  -Checking blood sugar before meals and bedtime and bring log next visit -All medications with mechanism of action and side effects -Hypoglycemia management: rule of 15's, Glucagon Emergency Kit and medical alert ID -low-carb low-fat plate-method diet -At least 20 minutes of physical activity per day -Annual dilated retinal eye exam and foot exam -compliance and follow up needs -follow up as scheduled or earlier if problem gets worse  Call if blood sugar is less than 70 or consistently above 250    Take a 15 gm  snack of carbohydrate at bedtime before you go to sleep if your blood sugar is less than 100.    If you are going to fast after midnight for a test or procedure, ask your physician for instructions on how to reduce/decrease your insulin dose.    Call if blood sugar is less than 70 or consistently above 250  -Treating a low sugar by rule of 15  (15 gms of sugar every 15 min until sugar is more than 70) If you feel your sugar is low, test your sugar  to be sure If your sugar is low (less than 70), then take 15 grams of a fast acting Carbohydrate (3-4 glucose tablets or glucose gel or 4 ounces of juice or regular soda) Recheck your sugar 15 min after treating low to make sure it is more than 70 If sugar is still less than 70, treat again with 15 grams of carbohydrate          Don't drive the hour of hypoglycemia  If unconscious/unable to eat or drink by mouth, use glucagon injection or nasal spray baqsimi and call 911. Can repeat again in 15 min if still unconscious.  Return in about 13 days (around 02/04/2023) for 1:40 pm .   I have reviewed current medications, nurse's notes, allergies, vital signs, past medical and surgical history, family medical history, and social history for this encounter. Counseled patient on symptoms, examination findings, lab findings, imaging results, treatment decisions and monitoring and prognosis. The patient understood the recommendations and agrees with the treatment plan. All questions regarding treatment plan were fully answered.  Altamese [place], MD  01/22/23    History of Present Illness Emily [name] is a 42 y.o. year old female who presents for evaluation of Type II diabetes mellitus.  Emily [name] was first diagnosed around 2012.   Diabetes education -  Home diabetes regimen: Humalog 10 units one-two times a day   COMPLICATIONS -  MI/Stroke -  retinopathy -  neuropathy -  nephropathy  SYMPTOMS REVIEWED + Polyuria + Weight loss + Blurred vision  BLOOD SUGAR DATA Doesn't have a meter, uses mom's sometimes Range per recall 206-400   Physical Exam  BP 120/80   Pulse 82   Ht 5\' 3"  (1.6 m)   Wt 198 lb 9.6 oz (90.1 kg)   SpO2 92%   BMI 35.18 kg/m    Constitutional: well developed, well nourished Head: normocephalic, atraumatic Eyes: sclera anicteric, no redness Neck: supple Lungs: normal respiratory effort Neurology: alert and oriented Skin: dry, no appreciable  rashes Musculoskeletal: no appreciable defects Psychiatric: normal mood and affect Diabetic Foot Exam - Simple   No data filed      Current Medications Patient's Medications  New Prescriptions   BLOOD GLUCOSE MONITORING SUPPL DEVI    1 each by Does not apply route in the morning, at noon, and at bedtime. May substitute to any manufacturer covered by patient's insurance.   CONTINUOUS GLUCOSE RECEIVER (DEXCOM G7 RECEIVER) DEVI    1 Device by Does not apply route continuous.   CONTINUOUS GLUCOSE SENSOR (DEXCOM G7 SENSOR) MISC    1 Device by Does not apply route continuous.   GLUCOSE BLOOD (BLOOD GLUCOSE TEST STRIPS) STRP    1 each by In Vitro route in the morning, at noon, and at bedtime. May substitute to any manufacturer covered by patient's insurance.   LANCET DEVICE MISC    1 each by Does not apply route in the morning, at  noon, and at bedtime. May substitute to any manufacturer covered by patient's insurance.   LANCETS MISC. MISC    1 each by Does not apply route in the morning, at noon, and at bedtime. May substitute to any manufacturer covered by patient's insurance.  Previous Medications   ACCU-CHEK AVIVA PLUS TEST STRIP    1 each by Other route 3 (three) times daily.   ACCU-CHEK FASTCLIX LANCETS MISC    USE AS DIRECTED   ALBUTEROL (VENTOLIN HFA) 108 (90 BASE) MCG/ACT INHALER    Inhale 2 puffs into the lungs every 6 (six) hours as needed for wheezing or shortness of breath.   AMITRIPTYLINE (ELAVIL) 25 MG TABLET    Take 50 mg by mouth at bedtime.   AMLODIPINE (NORVASC) 5 MG TABLET    Take 1 tablet (5 mg total) by mouth daily.   ATORVASTATIN (LIPITOR) 40 MG TABLET    Take 40 mg by mouth daily.   AZITHROMYCIN (ZITHROMAX) 250 MG TABLET    Take 2 now and 1 daily for 4 days   BENZONATATE (TESSALON) 200 MG CAPSULE    Take 1 capsule (200 mg total) by mouth 3 (three) times daily as needed.   BLOOD GLUCOSE MONITORING SUPPL (ACCU-CHEK AVIVA PLUS) W/DEVICE KIT    USE AS DIRECTED   CLONAZEPAM  (KLONOPIN) 0.5 MG DISINTEGRATING TABLET    Take 0.5 mg by mouth daily as needed.   FLUTICASONE-SALMETEROL (ADVAIR) 250-50 MCG/ACT AEPB    Inhale 1 puff into the lungs every 12 (twelve) hours. Rinse after use.   HUMALOG KWIKPEN 100 UNIT/ML KWIKPEN    Inject 10 Units into the skin 3 (three) times daily. SUB-Q 3 Times Daily   HYDROXYZINE (ATARAX) 25 MG TABLET    SMARTSIG:1 Tablet(s) By Mouth Every Evening   INCRUSE ELLIPTA 62.5 MCG/ACT AEPB    SMARTSIG:2 Puff(s) Via Inhaler Daily   IPRATROPIUM-ALBUTEROL (DUONEB) 0.5-2.5 (3) MG/3ML SOLN    Take 3 mLs by nebulization in the morning, at noon, and at bedtime.   LAMOTRIGINE (LAMICTAL) 150 MG TABLET    Take 150 mg by mouth daily.   LEVOTHYROXINE (SYNTHROID) 75 MCG TABLET    TAKE 1 TABLET BY MOUTH DAILY BEFORE BREAKFAST.   LOSARTAN (COZAAR) 50 MG TABLET    Take 1 tablet (50 mg total) by mouth daily.   MONTELUKAST (SINGULAIR) 10 MG TABLET    Take 1 tablet (10 mg total) by mouth at bedtime.   NEBULIZERS (COMPRESSOR/NEBULIZER) MISC    Use as directed   NITROGLYCERIN (NITROSTAT) 0.4 MG SL TABLET    Place 1 tablet (0.4 mg total) under the tongue every 5 (five) minutes as needed for chest pain.   OMEPRAZOLE (PRILOSEC) 40 MG CAPSULE    Take 40 mg by mouth every morning.   PAXIL 20 MG TABLET    Take 20 mg by mouth daily.   PIOGLITAZONE (ACTOS) 15 MG TABLET    Take 1 tablet (15 mg total) by mouth daily.   PROMETHAZINE (PHENERGAN) 12.5 MG TABLET    Take 12.5 mg by mouth every 8 (eight) hours as needed.   PROPRANOLOL (INDERAL) 20 MG TABLET    TAKE 1 TABLET BY MOUTH THREE TIMES A DAY   RESPIRATORY THERAPY SUPPLIES (NEBULIZER/TUBING/MOUTHPIECE) KIT    Use as directed   TINIDAZOLE (TINDAMAX) 500 MG TABLET    Take 4 tablets for 5 days   TRAZODONE (DESYREL) 50 MG TABLET    Take 50-100 mg by mouth at bedtime.   VARENICLINE (CHANTIX) 0.5 MG  TABLET    Take 1 tablet (0.5 mg total) by mouth daily for 3 days, THEN 1 tablet (0.5 mg total) 2 (two) times daily for 7 days, THEN 2  tablets (1 mg total) 2 (two) times daily.   ZIPRASIDONE (GEODON) 60 MG CAPSULE    Take 60 mg by mouth daily.   ZOLPIDEM (AMBIEN) 5 MG TABLET    Take by mouth.  Modified Medications   No medications on file  Discontinued Medications   No medications on file    Allergies Allergies  Allergen Reactions   Bactrim [Sulfamethoxazole-Trimethoprim] Anaphylaxis and Other (See Comments)    Chest pain, trouble breathing   Ciprofloxacin Palpitations   Clindamycin/Lincomycin     Other reaction(s): Chest Pain   Flagyl [Metronidazole] Shortness Of Breath   Iodinated Contrast Media Swelling   Ondansetron Shortness Of Breath   Liraglutide Nausea And Vomiting   Doxycycline Nausea And Vomiting   Penicillins    Prednisone Other (See Comments)    Makes her angry and irritable    Past Medical History Past Medical History:  Diagnosis Date   Abnormal CT of liver    Anxiety    Asthma    Asthma    Bipolar 1 disorder (HCC)    Bipolar disorder (HCC)    Chronic pain    Cirrhosis (HCC)    Depression    Diabetes mellitus without complication (HCC)    Fatty liver    Gallstones    GERD (gastroesophageal reflux disease)    Headache(784.0)    Hyperlipidemia    Hypertension    Hypothyroidism    Pulmonary nodule    Tachycardia    Thyroid disease    Vitamin D deficiency     Past Surgical History Past Surgical History:  Procedure Laterality Date   DILATION AND CURETTAGE OF UTERUS     1 yr ago   UPPER GI ENDOSCOPY  08/28/2022    Family History family history includes Asthma in her mother; Diabetes in her maternal grandmother; Hypertension in her maternal grandmother; Valvular heart disease in her father.  Social History Social History   Socioeconomic History   Marital status: Single    Spouse name: Not on file   Number of children: 0   Years of education: Not on file   Highest education level: 9th grade  Occupational History   Not on file  Tobacco Use   Smoking status: Every Day     Current packs/day: 1.00    Average packs/day: 1 pack/day for 20.0 years (20.0 ttl pk-yrs)    Types: Cigarettes   Smokeless tobacco: Never   Tobacco comments:    Smoking a pack per day.  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   Drug use: No   Sexual activity: Not Currently    Partners: Male    Birth control/protection: Abstinence  Other Topics Concern   Not on file  Social History Narrative   Not on file   Social Determinants of Health   Financial Resource Strain: Low Risk  (10/02/2022)   Overall Financial Resource Strain (CARDIA)    Difficulty of Paying Living Expenses: Not hard at all  Food Insecurity: No Food Insecurity (10/02/2022)   Hunger Vital Sign    Worried About Running Out of Food in the Last Year: Never true    Ran Out of Food in the Last Year: Never true  Transportation Needs: No Transportation Needs (10/02/2022)   PRAPARE - Administrator, Civil Service (Medical): No    Lack of Transportation (Non-Medical): No  Physical Activity: Inactive (10/02/2022)   Exercise Vital Sign    Days of Exercise per Week: 0 days    Minutes of Exercise per Session: 0 min  Stress: No Stress Concern Present (10/02/2022)   [employer] of Occupational Health - Occupational Stress Questionnaire    Feeling of Stress : Not at all  Social Connections: Socially Isolated (10/02/2022)   Social Connection and Isolation Panel [NHANES]    Frequency of Communication with Friends and Family: Once a week    Frequency of Social Gatherings with Friends and Family: Once a week    Attends Religious Services: Never    Database administrator or Organizations: No    Attends Banker Meetings: Never    Marital Status: Never married  Intimate Partner Violence: Not At Risk (11/07/2022)   Received from [facility]   HITS    Over the last 12 months how often did your partner physically hurt  you?: 1    Over the last 12 months how often did your partner insult you or talk down to you?: 1    Over the last 12 months how often did your partner threaten you with physical harm?: 1    Over the last 12 months how often did your partner scream or curse at you?: 1    Lab Results  Component Value Date   HGBA1C 11.0 (H) 01/16/2023   HGBA1C 9.7 (H) 09/24/2022   HGBA1C 6.2 (H) 02/20/2015   Lab Results  Component Value Date   CHOL 223 (H) 01/16/2023   Lab Results  Component Value Date   HDL 34.80 (L) 01/16/2023   Lab Results  Component Value Date   LDLCALC 102 (H) 07/16/2021   Lab Results  Component Value Date   TRIG (H) 01/16/2023    411.0 Triglyceride is over 400; calculations on Lipids are invalid.   Lab Results  Component Value Date   CHOLHDL 6 01/16/2023   Lab Results  Component Value Date   CREATININE 0.64 01/16/2023   Lab Results  Component Value Date   GFR 109.97 01/16/2023   Lab Results  Component Value Date   MICROALBUR <0.7 01/16/2023      Component Value Date/Time   NA 133 (L) 01/16/2023 1041   NA 135 05/02/2022 1617   K 4.0 01/16/2023 1041   CL 97 01/16/2023 1041   CO2 28 01/16/2023 1041   GLUCOSE 251 (H) 01/16/2023 1041   BUN 5 (L) 01/16/2023 1041   BUN 5 (L) 05/02/2022 1617   CREATININE 0.64 01/16/2023 1041   CREATININE 0.60 02/20/2015 0001   CALCIUM 9.0 01/16/2023 1041   PROT 7.5 01/16/2023 1041   PROT 7.0 07/16/2021 1412   ALBUMIN 3.9 01/16/2023 1041   ALBUMIN 3.6 (L) 07/16/2021 1412   AST 57 (H) 01/16/2023 1041   ALT 42 (H) 01/16/2023 1041   ALKPHOS 149 (H) 01/16/2023 1041   BILITOT 1.1 01/16/2023 1041   BILITOT 0.7 07/16/2021 1412   GFRNONAA >60 05/23/2022 0301   GFRAA 129 06/01/2018 1114      Latest Ref Rng & Units 01/16/2023   10:41 AM 12/01/2022    2:26 PM 10/27/2022    2:57 PM  BMP  Glucose 70 - 99 mg/dL 161  096  045   BUN 6 - 23 mg/dL 5  6  5  Creatinine 0.40 - 1.20 mg/dL 7.82  9.56  2.13   Sodium 135 - 145 mEq/L 133   126  128   Potassium 3.5 - 5.1 mEq/L 4.0  3.9  4.1   Chloride 96 - 112 mEq/L 97  94  95   CO2 19 - 32 mEq/L 28  25  26    Calcium 8.4 - 10.5 mg/dL 9.0  8.9  9.0        Component Value Date/Time   WBC 11.8 (H) 10/02/2022 1513   RBC 3.67 (L) 10/02/2022 1513   HGB 13.9 10/02/2022 1513   HGB 11.9 05/02/2022 1617   HCT 42.3 10/02/2022 1513   HCT 33.9 (L) 05/02/2022 1617   PLT 234.0 10/02/2022 1513   PLT 221 05/02/2022 1617   MCV 115.2 Repeated and verified X2. (H) 10/02/2022 1513   MCV 115 (H) 05/02/2022 1617   MCH 41.7 (H) 05/23/2022 0301   MCHC 32.9 10/02/2022 1513   RDW 14.5 10/02/2022 1513   RDW 11.9 05/02/2022 1617   LYMPHSABS 4.7 (H) 10/02/2022 1513   MONOABS 0.4 10/02/2022 1513   EOSABS 0.3 10/02/2022 1513   BASOSABS 0.1 10/02/2022 1513     Parts of this note may have been dictated using voice recognition software. There may be variances in spelling and vocabulary which are unintentional. Not all errors are proofread. Please notify the Thereasa Parkin if any discrepancies are noted or if the meaning of any statement is not clear.

## 2023-01-22 NOTE — Telephone Encounter (Signed)
Patient is calling to say that per the pharmacy the Dexcom G7 needs a prior authorization.

## 2023-01-23 ENCOUNTER — Encounter: Payer: Self-pay | Admitting: Family Medicine

## 2023-01-23 NOTE — Telephone Encounter (Signed)
[name], please see mychart messages sent by pt and advise.

## 2023-01-26 ENCOUNTER — Encounter: Payer: Self-pay | Admitting: "Endocrinology

## 2023-01-26 ENCOUNTER — Other Ambulatory Visit (HOSPITAL_COMMUNITY): Payer: Self-pay

## 2023-01-26 ENCOUNTER — Telehealth: Payer: Self-pay

## 2023-01-26 ENCOUNTER — Telehealth: Payer: Self-pay | Admitting: Family Medicine

## 2023-01-26 DIAGNOSIS — E871 Hypo-osmolality and hyponatremia: Secondary | ICD-10-CM

## 2023-01-26 NOTE — Addendum Note (Signed)
Addended by: Elwin Mocha on: 01/26/2023 03:35 PM   Modules accepted: Orders

## 2023-01-26 NOTE — Telephone Encounter (Signed)
Emily [name] is calling stating that she is having terrible leg pain and is asking what can or should she do, she is asking could this be coming from low sodium?

## 2023-01-26 NOTE — Telephone Encounter (Signed)
I spoke to [facility] and she is aware of the recommendation

## 2023-01-26 NOTE — Telephone Encounter (Signed)
Spoke with pt, is aware of referral being placed and that someone will be reaching out to schedule.

## 2023-01-26 NOTE — Telephone Encounter (Signed)
Endocrinologist concerned that patient's sodium level is low and requesting a referral to a specialists

## 2023-01-26 NOTE — Telephone Encounter (Signed)
Pharmacy Patient Advocate Encounter   Received notification from Pt Calls Messages that prior authorization for Dexcom G7 sensor is required/requested.   Insurance verification completed.   The patient is insured through [employer] .   Per test claim: PA required; PA submitted to [facility] via CoverMyMeds Key/confirmation #/EOC [id number] Status is pending

## 2023-01-27 ENCOUNTER — Ambulatory Visit: Payer: Medicaid Other | Admitting: Family Medicine

## 2023-01-27 ENCOUNTER — Ambulatory Visit: Payer: Medicaid Other | Admitting: Urology

## 2023-01-27 DIAGNOSIS — R300 Dysuria: Secondary | ICD-10-CM

## 2023-01-27 DIAGNOSIS — R399 Unspecified symptoms and signs involving the genitourinary system: Secondary | ICD-10-CM

## 2023-01-27 DIAGNOSIS — F17200 Nicotine dependence, unspecified, uncomplicated: Secondary | ICD-10-CM

## 2023-01-27 DIAGNOSIS — R310 Gross hematuria: Secondary | ICD-10-CM

## 2023-01-27 NOTE — Telephone Encounter (Signed)
Routing to Dr. Chase Caller.

## 2023-01-27 NOTE — Telephone Encounter (Signed)
Ok for Z PAK

## 2023-01-27 NOTE — Telephone Encounter (Signed)
Patient was seen by Dr. Marchelle Gearing recently  will need to decide on Zpack .

## 2023-01-28 ENCOUNTER — Telehealth: Payer: Self-pay

## 2023-01-28 DIAGNOSIS — M7918 Myalgia, other site: Secondary | ICD-10-CM | POA: Diagnosis not present

## 2023-01-28 NOTE — Telephone Encounter (Signed)
Replied MyChart

## 2023-01-28 NOTE — Telephone Encounter (Signed)
-----   Message from Zandra Abts sent at 01/23/2023  4:21 PM EDT ----- Good afternoon,   I can ask if she would like to be referred to nephrology. I have not offered this. Thank you for sending me this information.   Archie Patten,  Can you please ask her if she would like a nephrology referral. Thank you. ----- Message ----- From: Altamese Mims, MD Sent: 01/23/2023   9:07 AM EDT To: Leota Sauers, CMA; Alveria Apley, NP  Please let the patient know that her sodium has come back low at 129.  I have looped in her PCP.  If this has not been worked up, she will have to be referred to a nephrologist.  Thanks

## 2023-01-29 ENCOUNTER — Other Ambulatory Visit (HOSPITAL_COMMUNITY): Payer: Self-pay

## 2023-01-29 ENCOUNTER — Telehealth: Payer: Self-pay | Admitting: Family Medicine

## 2023-01-29 NOTE — Telephone Encounter (Signed)
Pt was prescribed Voltaren by an UC, they then called and said they did not want her to take it because of her cirrhosis, asking if pcp would prescribe something for joint pain (arms and legs)

## 2023-01-29 NOTE — Telephone Encounter (Signed)
PA request has been Approved. New Encounter created for follow up. For additional info see Pharmacy Prior Auth telephone encounter from 01/26/23.

## 2023-01-29 NOTE — Telephone Encounter (Signed)
Pharmacy Patient Advocate Encounter  Received notification from [facility] that Prior Authorization for Dexcom G7 Sensors has been APPROVED from 01/26/23 to 07/25/23. Ran test claim, Copay is $0. This test claim was processed through [facility]- copay amounts may vary at other pharmacies due to pharmacy/plan contracts, or as the patient moves through the different stages of their insurance plan.   PA #/Case ID/Reference #: [id number]

## 2023-01-30 ENCOUNTER — Other Ambulatory Visit: Payer: Self-pay | Admitting: "Endocrinology

## 2023-01-30 ENCOUNTER — Other Ambulatory Visit (HOSPITAL_BASED_OUTPATIENT_CLINIC_OR_DEPARTMENT_OTHER): Payer: Self-pay

## 2023-01-30 LAB — C-PEPTIDE: C-Peptide: 4.69 ng/mL — ABNORMAL HIGH (ref 0.80–3.85)

## 2023-01-30 LAB — GAD65, IA-2, AND INSULIN AUTOANTIBODY SERUM
Glutamic Acid Decarb Ab: <5 [IU]/mL (ref <5)
IA-2 Antibody: <5.4 U/mL (ref <5.4)
Insulin Antibodies, Human: <0.4 U/mL (ref <0.4)

## 2023-01-30 MED ORDER — AZITHROMYCIN 250 MG PO TABS: ORAL_TABLET | ORAL | 0 refills | Status: DC

## 2023-02-03 ENCOUNTER — Ambulatory Visit: Payer: Medicaid Other | Admitting: Adult Health

## 2023-02-03 ENCOUNTER — Encounter: Payer: Self-pay | Admitting: Adult Health

## 2023-02-03 VITALS — BP 119/85 | HR 74 | Ht 63.0 in | Wt 198.0 lb

## 2023-02-03 DIAGNOSIS — N907 Vulvar cyst: Secondary | ICD-10-CM | POA: Diagnosis not present

## 2023-02-03 DIAGNOSIS — D239 Other benign neoplasm of skin, unspecified: Secondary | ICD-10-CM | POA: Diagnosis not present

## 2023-02-03 NOTE — Progress Notes (Signed)
  Subjective:     Patient ID: Emily [name], female   DOB: 06/28/1980, 42 y.o.   MRN: [id number]  HPI Shariah is a 42 year old white female,single, G1P0010, in complaining of irregular bleeding and knot in vaginal area on the right.     Component Value Date/Time   DIAGPAP (A) 07/30/2022 1120    - Atypical squamous cells of undetermined significance (ASC-US)   HPVHIGH Positive (A) 07/30/2022 1120   ADEQPAP  07/30/2022 1120    Satisfactory for evaluation; transformation zone component PRESENT.  Had colpo 08/13/22 mild dysplasia, repeat in 1 year  PCP is Zandra Abts NP  Review of Systems +irregular bleeding, had 7 day period in September and then spotting for 1 week  Feels knot right labia area No sex in 2 years Reviewed past medical,surgical, social and family history. Reviewed medications and allergies.     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 Bladder is non tender and no masses felt. Fall risk is low  Upstream - 02/03/23 1405       Pregnancy Intention Screening   Does the patient want to become pregnant in the next year? No    Does the patient's partner want to become pregnant in the next year? No    Would the patient like to discuss contraceptive options today? No      Contraception Wrap Up   Current Method Abstinence    End Method Abstinence    Contraception Counseling Provided No             Examination chaperoned by [place] Scearce NP student  Assessment:     1. Sebaceous cyst of labia Almost resolved  Can put warm compress on   2. 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

## 2023-02-04 ENCOUNTER — Ambulatory Visit: Payer: Medicaid Other | Admitting: "Endocrinology

## 2023-02-05 ENCOUNTER — Telehealth: Payer: Self-pay | Admitting: Internal Medicine

## 2023-02-05 ENCOUNTER — Ambulatory Visit: Payer: MEDICAID | Admitting: Nutrition

## 2023-02-05 NOTE — Telephone Encounter (Signed)
There was a phone call taken today 02/05/23 regarding this Closing this encounter as it is a duplicate

## 2023-02-05 NOTE — Telephone Encounter (Signed)
She is exposing her self  to lot of radioation. Unless sometig changing I do not recommend cxr for now

## 2023-02-05 NOTE — Telephone Encounter (Signed)
I called and spoke with the pt  She is c/o increased cough- not really bothering her today but last night "coughed all night" Cough was prod with clear sputum  She states overall her breathing is doing well  Denies any fever, wheezing, CP  She states she is still smoking  Has not tried anything OTC for the cough  I advised she can try Delsym otc and let us know if she starts coughing up discolored sputum or fever  She verbalized understanding  She is asking if she needs cxr  Please advise, MR, thanks!

## 2023-02-05 NOTE — Telephone Encounter (Signed)
Patient states having symptom of cough. No available appointments at this time. Pharmacy is [facility]. Patient phone number is [phone].

## 2023-02-06 NOTE — Telephone Encounter (Signed)
I called and spoke with the pt and notified of response per Dr Chase Caller  She verbalized understanding  Nothing further needed

## 2023-02-09 ENCOUNTER — Encounter: Payer: Self-pay | Admitting: "Endocrinology

## 2023-02-09 NOTE — Telephone Encounter (Signed)
Spoke with pt, states she started taking er Vit D and has gotten better.

## 2023-02-10 NOTE — Progress Notes (Unsigned)
  Cardiology Office Note:   Date:  02/11/2023  ID:  Emily [name], DOB Mar 16, 1981, MRN [id number] PCP: Alveria Apley, NP  [facility] Providers Cardiologist:  Rollene Rotunda, MD {  History of Present Illness:   Emily [name] is a 42 y.o. female who previously followed with Dr. Elease Hashimoto.  She has chest pain and has had negative Myoviews.  She has been a no show for CTAs in the past but eventually had a non diagnostic CTA.  Pet was suggested.  She had normal echo in August 2024.  She has had palpitations and had no arrhythmias on monitor 01/2021.   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  She still smoking cigarettes and would have to be off cigarettes.  She denies any current chest discomfort, neck or arm discomfort.  She did have 1 episode while doing some chores the other day where she got some sharp mid chest discomfort that lasted for about 4 hours.  She took some ibuprofen and her symptoms went away.  She otherwise can do activities such as grocery shopping and vacuuming without bringing on chest pain.  She does not have new shortness of breath, PND or orthopnea.  She does not have any new palpitations, presyncope or syncope.  ROS: As stated in the HPI and negative for all other systems.  Studies Reviewed:    EKG:   Sinus rhythm, rate 78, axis within normal limits, intervals within normals, nonspecific anterior T wave changes, 10/02/2022  Risk Assessment/Calculations:              Physical Exam:   VS:  BP 118/80   Pulse 72   Ht 5\' 3"  (1.6 m)   Wt 198 lb (89.8 kg)   LMP 01/27/2023 (Approximate)   BMI 35.07 kg/m    Wt Readings from Last 3 Encounters:  02/11/23 198 lb (89.8 kg)  02/03/23 198 lb (89.8 kg)  01/22/23 198 lb 9.6 oz (90.1 kg)     GEN: Well nourished, well developed in no acute distress NECK: No JVD; No carotid bruits CARDIAC: RRR, no murmurs, rubs,  gallops RESPIRATORY:  Clear to auscultation without rales, wheezing or rhonchi  ABDOMEN: Soft, non-tender, non-distended EXTREMITIES:  No edema; No deformity   ASSESSMENT AND PLAN:   Chest pain: She has had chest discomfort in the past and workup as above.  The discomfort that she has infrequently now sounds not anginal.  At this point I do not think further imaging is indicated.  She has continued risk reduction.    Palpitations: She is symptomatic and not bothered by these.  No change in therapy.  Orientation: She needs to have a table extracted which would need to happen prior to any try to make a referral to oral surgery at [facility]  Tobacco abuse: She is using gum.  We talked about nicotine patches  Diabetes mellitus: Her A1c was 11.0.  She has just started seeing endocrinologist.  I will defer management to them.     Follow up with me in one year.   Signed, Rollene Rotunda, MD

## 2023-02-11 ENCOUNTER — Ambulatory Visit: Payer: MEDICAID | Admitting: Cardiology

## 2023-02-11 ENCOUNTER — Encounter: Payer: Self-pay | Admitting: Cardiology

## 2023-02-11 VITALS — BP 118/80 | HR 72 | Ht 63.0 in | Wt 198.0 lb

## 2023-02-11 DIAGNOSIS — K029 Dental caries, unspecified: Secondary | ICD-10-CM

## 2023-02-11 DIAGNOSIS — R072 Precordial pain: Secondary | ICD-10-CM

## 2023-02-11 DIAGNOSIS — R002 Palpitations: Secondary | ICD-10-CM | POA: Diagnosis not present

## 2023-02-11 NOTE — Telephone Encounter (Signed)
I called and spoke with the pt  She is requesting something for cough  She states over the past few days she has started to produce some green/clear sputum  She denies any fevers, aches, increased SOB, wheezing  Last had Zpack on 01/27/23  MR- please advise thanks!  Allergies  Allergen Reactions   Bactrim [Sulfamethoxazole-Trimethoprim] Anaphylaxis and Other (See Comments)    Chest pain, trouble breathing   Ciprofloxacin Palpitations   Clindamycin/Lincomycin     Other reaction(s): Chest Pain   Flagyl [Metronidazole] Shortness Of Breath   Hydrocodone Other (See Comments)   Iodinated Contrast Media Swelling   Ondansetron Shortness Of Breath   Liraglutide Nausea And Vomiting   Doxycycline Nausea And Vomiting   Penicillins    Prednisone Other (See Comments)    Makes her angry and irritable

## 2023-02-11 NOTE — Patient Instructions (Signed)
Medication Instructions:  The current medical regimen is effective;  continue present plan and medications.  *If you need a refill on your cardiac medications before your next appointment, please call your pharmacy*  You have been referred to Oral Surgery and will be contacted to be scheduled.  Follow-Up: At [facility], you and your health needs are our priority.  As part of our continuing mission to provide you with exceptional heart care, we have created designated Provider Care Teams.  These Care Teams include your primary Cardiologist (physician) and Advanced Practice Providers (APPs -  Physician Assistants and Nurse Practitioners) who all work together to provide you with the care you need, when you need it.  We recommend signing up for the patient portal called "MyChart".  Sign up information is provided on this After Visit Summary.  MyChart is used to connect with patients for Virtual Visits (Telemedicine).  Patients are able to view lab/test results, encounter notes, upcoming appointments, etc.  Non-urgent messages can be sent to your provider as well.   To learn more about what you can do with MyChart, go to [email or web address].    Your next appointment:   1 year(s)  Provider:   Rollene Rotunda, MD

## 2023-02-12 NOTE — Telephone Encounter (Signed)
  Take doxycycline 100mg  po twice daily x 5 days; take after meals and avoid sunlight  Can do steroids if her mood can handle it  - Please take prednisone 40 mg x1 day, then 30 mg x1 day, then 20 mg x1 day, then 10 mg x1 day, and then 5 mg x1 day and stop    Allergies  Allergen Reactions   Bactrim [Sulfamethoxazole-Trimethoprim] Anaphylaxis and Other (See Comments)    Chest pain, trouble breathing   Ciprofloxacin Palpitations   Clindamycin/Lincomycin     Other reaction(s): Chest Pain   Flagyl [Metronidazole] Shortness Of Breath   Hydrocodone Other (See Comments)   Iodinated Contrast Media Swelling   Ondansetron Shortness Of Breath   Liraglutide Nausea And Vomiting   Doxycycline Nausea And Vomiting   Penicillins    Prednisone Other (See Comments)    Makes her angry and irritable

## 2023-02-13 ENCOUNTER — Encounter: Payer: Self-pay | Admitting: *Deleted

## 2023-02-13 MED ORDER — PREDNISONE 10 MG PO TABS: ORAL_TABLET | ORAL | 0 refills | Status: DC

## 2023-02-13 MED ORDER — DOXYCYCLINE HYCLATE 100 MG PO TABS: 100.0000 mg | ORAL_TABLET | Freq: Two times a day (BID) | ORAL | 0 refills | Status: DC

## 2023-02-13 MED ORDER — CEPHALEXIN 500 MG PO TABS: 500.0000 mg | ORAL_TABLET | Freq: Three times a day (TID) | ORAL | 0 refills | Status: DC

## 2023-02-13 NOTE — Telephone Encounter (Signed)
MR- you wanted pt to take doxy, however, she is allergic to this- causes vomiting. Please advise on alternative.  Allergies  Allergen Reactions   Bactrim [Sulfamethoxazole-Trimethoprim] Anaphylaxis and Other (See Comments)    Chest pain, trouble breathing   Ciprofloxacin Palpitations   Clindamycin/Lincomycin     Other reaction(s): Chest Pain   Flagyl [Metronidazole] Shortness Of Breath   Hydrocodone Other (See Comments)   Iodinated Contrast Media Swelling   Ondansetron Shortness Of Breath   Liraglutide Nausea And Vomiting   Doxycycline Nausea And Vomiting   Penicillins    Prednisone Other (See Comments)    Makes her angry and irritable

## 2023-02-13 NOTE — Telephone Encounter (Signed)
  Plan   - So many allergies  - she can try cephalexin 500mg  three times daily x  5 days   Allergies  Allergen Reactions   Bactrim [Sulfamethoxazole-Trimethoprim] Anaphylaxis and Other (See Comments)    Chest pain, trouble breathing   Ciprofloxacin Palpitations   Clindamycin/Lincomycin     Other reaction(s): Chest Pain   Flagyl [Metronidazole] Shortness Of Breath   Hydrocodone Other (See Comments)   Iodinated Contrast Media Swelling   Ondansetron Shortness Of Breath   Liraglutide Nausea And Vomiting   Doxycycline Nausea And Vomiting   Penicillins    Prednisone Other (See Comments)    Makes her angry and irritable

## 2023-02-16 ENCOUNTER — Ambulatory Visit (INDEPENDENT_AMBULATORY_CARE_PROVIDER_SITE_OTHER): Payer: Medicaid Other

## 2023-02-16 ENCOUNTER — Ambulatory Visit: Payer: Medicaid Other | Admitting: Family Medicine

## 2023-02-16 VITALS — BP 124/86 | HR 85 | Temp 98.4°F | Wt 200.6 lb

## 2023-02-16 DIAGNOSIS — M545 Low back pain, unspecified: Secondary | ICD-10-CM

## 2023-02-16 DIAGNOSIS — R0989 Other specified symptoms and signs involving the circulatory and respiratory systems: Secondary | ICD-10-CM | POA: Diagnosis not present

## 2023-02-16 DIAGNOSIS — R200 Anesthesia of skin: Secondary | ICD-10-CM | POA: Diagnosis not present

## 2023-02-16 DIAGNOSIS — G8929 Other chronic pain: Secondary | ICD-10-CM

## 2023-02-16 DIAGNOSIS — M25552 Pain in left hip: Secondary | ICD-10-CM | POA: Diagnosis not present

## 2023-02-16 DIAGNOSIS — M79604 Pain in right leg: Secondary | ICD-10-CM

## 2023-02-16 DIAGNOSIS — R52 Pain, unspecified: Secondary | ICD-10-CM

## 2023-02-16 DIAGNOSIS — M79672 Pain in left foot: Secondary | ICD-10-CM

## 2023-02-16 DIAGNOSIS — E118 Type 2 diabetes mellitus with unspecified complications: Secondary | ICD-10-CM | POA: Diagnosis not present

## 2023-02-16 DIAGNOSIS — M25551 Pain in right hip: Secondary | ICD-10-CM | POA: Diagnosis not present

## 2023-02-16 DIAGNOSIS — L608 Other nail disorders: Secondary | ICD-10-CM | POA: Diagnosis not present

## 2023-02-16 DIAGNOSIS — R202 Paresthesia of skin: Secondary | ICD-10-CM | POA: Diagnosis not present

## 2023-02-16 DIAGNOSIS — M79605 Pain in left leg: Secondary | ICD-10-CM | POA: Diagnosis not present

## 2023-02-16 DIAGNOSIS — M79671 Pain in right foot: Secondary | ICD-10-CM

## 2023-02-16 NOTE — Progress Notes (Signed)
Acute Office Visit   Subjective:  Patient ID: Emily [name], female    DOB: 20-Feb-1981, 42 y.o.   MRN: [id number]  Chief Complaint  Patient presents with   Pain    Back and leg pain. Lower right leg pain and pain in foot. Going on 1 month. Has taken tylenol, ibuprofen, heat compresses, and the icy roll on from otc. Got bad to the point she woke up and could not walk, had to ge thelp.     HPI Patient is complaining of bilateral leg pain, lumbar pain, buttocks, and hip pain. She reports it started about a month ago. Intermittent pain. Pain usually last hours. Described as an aching, stabbing pain. Reports numbness in her toes. She reports has took Ibuprofen, Tylenol, Toradol, Icy Hot, Lidocaine, and heat compressed with no relief. Denies any recent injury. She reports she has had occurences where she could not walk and had to help with getting up.   ROS See HPI above      Objective:    BP 124/86 (BP Location: Left Arm, Patient Position: Sitting, Cuff Size: Normal)   Pulse 85   Temp 98.4 F (36.9 C) (Oral)   Wt 200 lb 9.6 oz (91 kg)   LMP 01/27/2023 (Approximate)   SpO2 95%   BMI 35.53 kg/m    Physical Exam Vitals reviewed.  Constitutional:      General: She is not in acute distress.    Appearance: Normal appearance. She is obese. She is not ill-appearing, toxic-appearing or diaphoretic.  HENT:     Head: Normocephalic and atraumatic.  Eyes:     General:        Right eye: No discharge.        Left eye: No discharge.     Conjunctiva/sclera: Conjunctivae normal.  Cardiovascular:     Rate and Rhythm: Normal rate.  Pulmonary:     Effort: Pulmonary effort is normal. No respiratory distress.  Abdominal:     Tenderness: There is no right CVA tenderness or left CVA tenderness.  Musculoskeletal:        General: Normal range of motion.     Cervical back: No tenderness.     Thoracic back: No tenderness.     Lumbar back: Tenderness present.     Right lower leg: No  tenderness.     Left lower leg: No tenderness.     Right foot: Tenderness present. Abnormal pulse.     Left foot: Tenderness present. Abnormal pulse.  Feet:     Right foot:     Skin integrity: Skin integrity normal.     Toenail Condition: Right toenails are abnormally thick and long.     Left foot:     Skin integrity: Skin integrity normal.     Toenail Condition: Left toenails are abnormally thick and long.     Comments: Unable to palpate bilateral dorsalis pedis pulse.  Skin:    General: Skin is warm and dry.     Findings: No erythema (In calfs).  Neurological:     General: No focal deficit present.     Mental Status: She is alert and oriented to person, place, and time. Mental status is at baseline.     Gait: Gait abnormal.  Psychiatric:        Mood and Affect: Mood normal.        Behavior: Behavior normal.        Thought Content: Thought content normal.  Judgment: Judgment normal.       Assessment & Plan:  Pain of multiple sites -     Rheumatoid factor -     Sedimentation rate -     C-reactive protein -     ANA -     DG HIP UNILAT W OR W/O PELVIS 2-3 VIEWS LEFT; Future -     DG HIP UNILAT W OR W/O PELVIS 2-3 VIEWS RIGHT; Future  Numbness and tingling of both feet -     Vitamin B12  Lumbar pain -     DG Lumbar Spine Complete; Future  Chronic pain of both feet -     Ambulatory referral to Podiatry  Discoloration and thickening of nails both feet -     Ambulatory referral to Podiatry  Bilateral hip pain -     DG HIP UNILAT W OR W/O PELVIS 2-3 VIEWS LEFT; Future -     DG HIP UNILAT W OR W/O PELVIS 2-3 VIEWS RIGHT; Future  Decreased dorsalis pedis pulse -     VAS [place] ABI WITH/WO TBI; Future  Bilateral leg and foot pain -     VAS [place] ABI WITH/WO TBI; Future  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 Could be  autoimmune disorder, need to rule out. If labs are concerning, will send rheumatologist.   -Ordered lumbar spine and bilateral hip x-ray for  joint pain.Pain could be more related to arthritis. May need to send to ortho depending on results.  -Placed a referral to podiatry for foot pain, thick and long toe nails.   -Ordered vascular ultrasound ABI due to decreased dorsalis pedis pulse, bilateral leg/foot pain, and has a history of uncontrolled diabetes.   Zandra Abts, NP

## 2023-02-16 NOTE — Patient Instructions (Signed)
-  Ordered labs for a reason of your symptom. Office will call with lab results and you will see results on MyChart.  -Ordered lumbar spine and bilateral hip x-ray for pain. Office will call with results and you will see results on MyChart.  -Placed a referral to podiatry. Call back to the office or send a MyChart message if you do receive a phone or MyChart message about an appointment in 2 weeks.  -Ordered vascular ultrasound ABI. Call back to the office or send a MyChart message if you do receive a phone or MyChart message about an appointment in 2 weeks.

## 2023-02-17 ENCOUNTER — Encounter: Payer: Self-pay | Admitting: "Endocrinology

## 2023-02-17 ENCOUNTER — Ambulatory Visit: Payer: Medicaid Other | Admitting: "Endocrinology

## 2023-02-17 ENCOUNTER — Telehealth: Payer: Self-pay | Admitting: Family Medicine

## 2023-02-17 VITALS — BP 100/84 | HR 87 | Ht 63.0 in | Wt 199.6 lb

## 2023-02-17 DIAGNOSIS — Z794 Long term (current) use of insulin: Secondary | ICD-10-CM

## 2023-02-17 DIAGNOSIS — E782 Mixed hyperlipidemia: Secondary | ICD-10-CM | POA: Diagnosis not present

## 2023-02-17 DIAGNOSIS — E1165 Type 2 diabetes mellitus with hyperglycemia: Secondary | ICD-10-CM

## 2023-02-17 LAB — SEDIMENTATION RATE: Sed Rate: 51 mm/h — ABNORMAL HIGH (ref 0–20)

## 2023-02-17 LAB — VITAMIN B12: Vitamin B-12: 299 pg/mL (ref 211–911)

## 2023-02-17 LAB — C-REACTIVE PROTEIN: CRP: 1.9 mg/dL (ref 0.5–20.0)

## 2023-02-17 MED ORDER — LANTUS SOLOSTAR 100 UNIT/ML ~~LOC~~ SOPN: 15.0000 [IU] | PEN_INJECTOR | Freq: Every day | SUBCUTANEOUS | 4 refills | Status: DC

## 2023-02-17 NOTE — Telephone Encounter (Signed)
Pt is calling and would like callback concerning bloodwork results

## 2023-02-17 NOTE — Progress Notes (Signed)
Outpatient Endocrinology Note Altamese Banks, MD  02/17/23   Emily [name] [birth year] [id number]  Referring Provider: Alveria Apley, NP Primary Care Provider: Alveria Apley, NP Reason for consultation: Subjective   Assessment & Plan  There are no diagnoses linked to this encounter.   Diabetes Type II complicated by neuropathy Lab Results  Component Value Date   GFR 108.36 01/22/2023   Hba1c goal less than 7, current Hba1c is  Lab Results  Component Value Date   HGBA1C 11.0 (H) 01/16/2023   Will recommend the following: Start Lantus 15 units once every morning. Increase by 1 unit every day until fasting blood sugar is less than 120. Stay on that dose.   Humalog 10 units tidac, 15 min before meals Check BG tidac/start dexcom; ordered both  No known contraindications/side effects to any of above medications  -Last LD and Tg are as follows: Lab Results  Component Value Date   LDLCALC 102 (H) 07/16/2021    Lab Results  Component Value Date   TRIG (H) 01/16/2023    411.0 Triglyceride is over 400; calculations on Lipids are invalid.   -not on statin (has cirrhosis per pt, atorvastatin 40 mg was stopped by provider) -Follow low fat diet and exercise   -Blood pressure goal <140/90 - Microalbumin/creatinine goal is < 30 -Last MA/Cr is as follows: Lab Results  Component Value Date   MICROALBUR <0.7 01/16/2023   -not on ACE/ARB  -diet changes including salt restriction -limit eating outside -counseled BP targets per standards of diabetes care -uncontrolled blood pressure can lead to retinopathy, nephropathy and cardiovascular and atherosclerotic heart disease  Reviewed and counseled on: -A1C target -Blood sugar targets -Complications of uncontrolled diabetes  -Checking blood sugar before meals and bedtime and bring log next visit -All medications with mechanism of action and side effects -Hypoglycemia management: rule of 15's, Glucagon  Emergency Kit and medical alert ID -low-carb low-fat plate-method diet -At least 20 minutes of physical activity per day -Annual dilated retinal eye exam and foot exam -compliance and follow up needs -follow up as scheduled or earlier if problem gets worse  Call if blood sugar is less than 70 or consistently above 250    Take a 15 gm snack of carbohydrate at bedtime before you go to sleep if your blood sugar is less than 100.    If you are going to fast after midnight for a test or procedure, ask your physician for instructions on how to reduce/decrease your insulin dose.    Call if blood sugar is less than 70 or consistently above 250  -Treating a low sugar by rule of 15  (15 gms of sugar every 15 min until sugar is more than 70) If you feel your sugar is low, test your sugar to be sure If your sugar is low (less than 70), then take 15 grams of a fast acting Carbohydrate (3-4 glucose tablets or glucose gel or 4 ounces of juice or regular soda) Recheck your sugar 15 min after treating low to make sure it is more than 70 If sugar is still less than 70, treat again with 15 grams of carbohydrate          Don't drive the hour of hypoglycemia  If unconscious/unable to eat or drink by mouth, use glucagon injection or nasal spray baqsimi and call 911. Can repeat again in 15 min if still unconscious.  Return in about 15 days (around 03/04/2023).   I have reviewed  current medications, nurse's notes, allergies, vital signs, past medical and surgical history, family medical history, and social history for this encounter. Counseled patient on symptoms, examination findings, lab findings, imaging results, treatment decisions and monitoring and prognosis. The patient understood the recommendations and agrees with the treatment plan. All questions regarding treatment plan were fully answered.  [name], MD  02/17/23  History of Present Illness Emily [name] is a 42 y.o. year old female who  presents for evaluation of Type II diabetes mellitus.  Emily [name] was first diagnosed around 2012.   Diabetes education -  Home diabetes regimen: Humalog 10 units three times a day   COMPLICATIONS -  MI/Stroke -  retinopathy -  neuropathy -  nephropathy  SYMPTOMS REVIEWED + Polyuria + Weight loss + Blurred vision  BLOOD SUGAR DATA  CGM Interpretation: Blood sugars reviewed today. They are elevated across the day.   Physical Exam  BP 100/84   Pulse 87   Ht 5\' 3"  (1.6 m)   Wt 199 lb 9.6 oz (90.5 kg)   LMP 01/27/2023 (Approximate)   SpO2 94%   BMI 35.36 kg/m    Constitutional: well developed, well nourished Head: normocephalic, atraumatic Eyes: sclera anicteric, no redness Neck: supple Lungs: normal respiratory effort Neurology: alert and oriented Skin: dry, no appreciable rashes Musculoskeletal: no appreciable defects Psychiatric: normal mood and affect Diabetic Foot Exam - Simple   No data filed      Current Medications Patient's Medications  New Prescriptions   INSULIN GLARGINE (LANTUS SOLOSTAR) 100 UNIT/ML SOLOSTAR PEN    Inject 15 Units into the skin daily. Plus sliding scale, max dose 60 units per day  Previous Medications   ACCU-CHEK AVIVA PLUS TEST STRIP    1 each by Other route 3 (three) times daily.   ACCU-CHEK FASTCLIX LANCETS MISC    USE AS DIRECTED   ALBUTEROL (VENTOLIN HFA) 108 (90 BASE) MCG/ACT INHALER    Inhale 2 puffs into the lungs every 6 (six) hours as needed for wheezing or shortness of breath.   AMITRIPTYLINE (ELAVIL) 25 MG TABLET    Take 50 mg by mouth at bedtime.   AMLODIPINE (NORVASC) 5 MG TABLET    Take 1 tablet (5 mg total) by mouth daily.   BENZONATATE (TESSALON) 200 MG CAPSULE    Take 1 capsule (200 mg total) by mouth 3 (three) times daily as needed.   BLOOD GLUCOSE MONITORING SUPPL (ACCU-CHEK AVIVA PLUS) W/DEVICE KIT    USE AS DIRECTED   BLOOD GLUCOSE MONITORING SUPPL DEVI    1 each by Does not apply route in the morning, at  noon, and at bedtime. May substitute to any manufacturer covered by patient's insurance.   CEPHALEXIN 500 MG TABLET    Take 1 tablet (500 mg total) by mouth 3 (three) times daily.   CLONAZEPAM (KLONOPIN) 0.5 MG DISINTEGRATING TABLET    Take 0.5 mg by mouth daily as needed.   CONTINUOUS GLUCOSE RECEIVER (DEXCOM G7 RECEIVER) DEVI    1 Device by Does not apply route continuous.   CONTINUOUS GLUCOSE SENSOR (DEXCOM G7 SENSOR) MISC    1 Device by Does not apply route continuous.   DOXYCYCLINE (VIBRA-TABS) 100 MG TABLET    Take 1 tablet (100 mg total) by mouth 2 (two) times daily.   FLUTICASONE-SALMETEROL (ADVAIR) 250-50 MCG/ACT AEPB    Inhale 1 puff into the lungs every 12 (twelve) hours. Rinse after use.   GLUCOSE BLOOD (BLOOD GLUCOSE TEST STRIPS) STRP  1 each by In Vitro route in the morning, at noon, and at bedtime. May substitute to any manufacturer covered by patient's insurance.   HUMALOG KWIKPEN 100 UNIT/ML KWIKPEN    Inject 10 Units into the skin 3 (three) times daily. SUB-Q 3 Times Daily   INCRUSE ELLIPTA 62.5 MCG/ACT AEPB    daily as needed.   IPRATROPIUM-ALBUTEROL (DUONEB) 0.5-2.5 (3) MG/3ML SOLN    Take 3 mLs by nebulization in the morning, at noon, and at bedtime.   LAMOTRIGINE (LAMICTAL) 150 MG TABLET    Take 150 mg by mouth daily.   LANCET DEVICE MISC    1 each by Does not apply route in the morning, at noon, and at bedtime. May substitute to any manufacturer covered by patient's insurance.   LANCETS MISC. MISC    1 each by Does not apply route in the morning, at noon, and at bedtime. May substitute to any manufacturer covered by patient's insurance.   LEVOTHYROXINE (SYNTHROID) 75 MCG TABLET    TAKE 1 TABLET BY MOUTH DAILY BEFORE BREAKFAST.   MONTELUKAST (SINGULAIR) 10 MG TABLET    Take 1 tablet (10 mg total) by mouth at bedtime.   NEBULIZERS (COMPRESSOR/NEBULIZER) MISC    Use as directed   NITROGLYCERIN (NITROSTAT) 0.4 MG SL TABLET    Place 1 tablet (0.4 mg total) under the tongue every  5 (five) minutes as needed for chest pain.   OMEPRAZOLE (PRILOSEC) 40 MG CAPSULE    Take 40 mg by mouth every morning.   PAXIL 20 MG TABLET    Take 20 mg by mouth daily.   PIOGLITAZONE (ACTOS) 15 MG TABLET    Take 1 tablet (15 mg total) by mouth daily.   PREDNISONE (DELTASONE) 10 MG TABLET    4 x 1 day, 3 x 1 day, 2 x 1 day, 1 x 1 day, 1/2 x 1 day then stop   PROMETHAZINE (PHENERGAN) 12.5 MG TABLET    Take 12.5 mg by mouth every 8 (eight) hours as needed.   PROPRANOLOL (INDERAL) 20 MG TABLET    TAKE 1 TABLET BY MOUTH THREE TIMES A DAY   RESPIRATORY THERAPY SUPPLIES (NEBULIZER/TUBING/MOUTHPIECE) KIT    Use as directed   TRAZODONE (DESYREL) 50 MG TABLET    Take 50-100 mg by mouth at bedtime.   ZIPRASIDONE (GEODON) 60 MG CAPSULE    Take 60 mg by mouth daily.  Modified Medications   No medications on file  Discontinued Medications   No medications on file    Allergies Allergies  Allergen Reactions   Bactrim [Sulfamethoxazole-Trimethoprim] Anaphylaxis and Other (See Comments)    Chest pain, trouble breathing   Ciprofloxacin Palpitations   Clindamycin/Lincomycin     Other reaction(s): Chest Pain   Flagyl [Metronidazole] Shortness Of Breath   Hydrocodone Other (See Comments)   Iodinated Contrast Media Swelling   Ondansetron Shortness Of Breath   Liraglutide Nausea And Vomiting   Doxycycline Nausea And Vomiting   Penicillins    Prednisone Other (See Comments)    Makes her angry and irritable    Past Medical History Past Medical History:  Diagnosis Date   Abnormal CT of liver    Anxiety    Asthma    Asthma    Bipolar 1 disorder (HCC)    Bipolar disorder (HCC)    Chronic pain    Cirrhosis (HCC)    Depression    Diabetes mellitus without complication (HCC)    Fatty liver    Gallstones  GERD (gastroesophageal reflux disease)    Headache(784.0)    Hyperlipidemia    Hypertension    Hypothyroidism    Pulmonary nodule    Tachycardia    Thyroid disease    Vitamin D  deficiency     Past Surgical History Past Surgical History:  Procedure Laterality Date   DILATION AND CURETTAGE OF UTERUS     1 yr ago   UPPER GI ENDOSCOPY  08/28/2022    Family History family history includes Asthma in her mother; Diabetes in her maternal grandmother; Hypertension in her maternal grandmother; Valvular heart disease in her father.  Social History Social History   Socioeconomic History   Marital status: Single    Spouse name: Not on file   Number of children: 0   Years of education: Not on file   Highest education level: 9th grade  Occupational History   Not on file  Tobacco Use   Smoking status: Every Day    Current packs/day: 1.00    Average packs/day: 1 pack/day for 20.0 years (20.0 ttl pk-yrs)    Types: Cigarettes   Smokeless tobacco: Never   Tobacco comments:    Smoking a pack per day.  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   Drug use: No   Sexual activity: Not Currently    Partners: Male    Birth control/protection: Abstinence  Other Topics Concern   Not on file  Social History Narrative   Not on file   Social Determinants of Health   Financial Resource Strain: Low Risk  (10/02/2022)   Overall Financial Resource Strain (CARDIA)    Difficulty of Paying Living Expenses: Not hard at all  Food Insecurity: No Food Insecurity (10/02/2022)   Hunger Vital Sign    Worried About Running Out of Food in the Last Year: Never true    Ran Out of Food in the Last Year: Never true  Transportation Needs: No Transportation Needs (10/02/2022)   PRAPARE - Administrator, Civil Service (Medical): No    Lack of Transportation (Non-Medical): No  Physical Activity: Inactive (10/02/2022)   Exercise Vital Sign    Days of Exercise per Week: 0 days    Minutes of Exercise per Session: 0 min  Stress: No Stress Concern Present (10/02/2022)   [employer] of  Occupational Health - Occupational Stress Questionnaire    Feeling of Stress : Not at all  Social Connections: Socially Isolated (10/02/2022)   Social Connection and Isolation Panel [NHANES]    Frequency of Communication with Friends and Family: Once a week    Frequency of Social Gatherings with Friends and Family: Once a week    Attends Religious Services: Never    Database administrator or Organizations: No    Attends Banker Meetings: Never    Marital Status: Never married  Intimate Partner Violence: Not At Risk (11/07/2022)   Received from [facility]   HITS    Over the last 12 months how often did your partner physically hurt you?: 1    Over the last 12 months how often did your partner insult you or talk down to you?: 1    Over the last 12 months how often did your partner threaten you with physical harm?: 1    Over the last 12 months how often did your partner scream or curse at  you?: 1    Lab Results  Component Value Date   HGBA1C 11.0 (H) 01/16/2023   HGBA1C 9.7 (H) 09/24/2022   HGBA1C 6.2 (H) 02/20/2015   Lab Results  Component Value Date   CHOL 223 (H) 01/16/2023   Lab Results  Component Value Date   HDL 34.80 (L) 01/16/2023   Lab Results  Component Value Date   LDLCALC 102 (H) 07/16/2021   Lab Results  Component Value Date   TRIG (H) 01/16/2023    411.0 Triglyceride is over 400; calculations on Lipids are invalid.   Lab Results  Component Value Date   CHOLHDL 6 01/16/2023   Lab Results  Component Value Date   CREATININE 0.68 01/22/2023   Lab Results  Component Value Date   GFR 108.36 01/22/2023   Lab Results  Component Value Date   MICROALBUR <0.7 01/16/2023      Component Value Date/Time   NA 129 (L) 01/22/2023 1335   NA 135 05/02/2022 1617   K 4.3 01/22/2023 1335   CL 96 01/22/2023 1335   CO2 26 01/22/2023 1335   GLUCOSE 319 (H) 01/22/2023 1335   BUN 5 (L) 01/22/2023 1335   BUN 5 (L) 05/02/2022 1617   CREATININE 0.68  01/22/2023 1335   CREATININE 0.60 02/20/2015 0001   CALCIUM 8.8 01/22/2023 1335   PROT 7.5 01/16/2023 1041   PROT 7.0 07/16/2021 1412   ALBUMIN 3.9 01/16/2023 1041   ALBUMIN 3.6 (L) 07/16/2021 1412   AST 57 (H) 01/16/2023 1041   ALT 42 (H) 01/16/2023 1041   ALKPHOS 149 (H) 01/16/2023 1041   BILITOT 1.1 01/16/2023 1041   BILITOT 0.7 07/16/2021 1412   GFRNONAA >60 05/23/2022 0301   GFRAA 129 06/01/2018 1114      Latest Ref Rng & Units 01/22/2023    1:35 PM 01/16/2023   10:41 AM 12/01/2022    2:26 PM  BMP  Glucose 70 - 99 mg/dL 161  096  045   BUN 6 - 23 mg/dL 5  5  6    Creatinine 0.40 - 1.20 mg/dL 4.09  8.11  9.14   Sodium 135 - 145 mEq/L 129  133  126   Potassium 3.5 - 5.1 mEq/L 4.3  4.0  3.9   Chloride 96 - 112 mEq/L 96  97  94   CO2 19 - 32 mEq/L 26  28  25    Calcium 8.4 - 10.5 mg/dL 8.8  9.0  8.9        Component Value Date/Time   WBC 11.8 (H) 10/02/2022 1513   RBC 3.67 (L) 10/02/2022 1513   HGB 13.9 10/02/2022 1513   HGB 11.9 05/02/2022 1617   HCT 42.3 10/02/2022 1513   HCT 33.9 (L) 05/02/2022 1617   PLT 234.0 10/02/2022 1513   PLT 221 05/02/2022 1617   MCV 115.2 Repeated and verified X2. (H) 10/02/2022 1513   MCV 115 (H) 05/02/2022 1617   MCH 41.7 (H) 05/23/2022 0301   MCHC 32.9 10/02/2022 1513   RDW 14.5 10/02/2022 1513   RDW 11.9 05/02/2022 1617   LYMPHSABS 4.7 (H) 10/02/2022 1513   MONOABS 0.4 10/02/2022 1513   EOSABS 0.3 10/02/2022 1513   BASOSABS 0.1 10/02/2022 1513     Parts of this note may have been dictated using voice recognition software. There may be variances in spelling and vocabulary which are unintentional. Not all errors are proofread. Please notify the Thereasa Parkin if any discrepancies are noted or if the meaning of any statement is  not clear.

## 2023-02-17 NOTE — Telephone Encounter (Signed)
Waiting on result note from pcp.

## 2023-02-18 ENCOUNTER — Encounter: Payer: Self-pay | Admitting: Family Medicine

## 2023-02-18 DIAGNOSIS — R52 Pain, unspecified: Secondary | ICD-10-CM

## 2023-02-18 NOTE — Telephone Encounter (Signed)
Pt called, requesting to speak to CMA. Pt was asked if call was regarding a medication refill, as I could help with that.   Pt stated it was for something else and would rather speak directly to CMA. Please return Pt's call at your earliest convenience.

## 2023-02-19 ENCOUNTER — Ambulatory Visit: Payer: Medicaid Other | Admitting: Podiatry

## 2023-02-19 DIAGNOSIS — R7689 Other specified abnormal immunological findings in serum: Secondary | ICD-10-CM

## 2023-02-19 DIAGNOSIS — M25551 Pain in right hip: Secondary | ICD-10-CM

## 2023-02-19 DIAGNOSIS — M545 Low back pain, unspecified: Secondary | ICD-10-CM

## 2023-02-19 DIAGNOSIS — R768 Other specified abnormal immunological findings in serum: Secondary | ICD-10-CM

## 2023-02-19 LAB — RHEUMATOID FACTOR: Rheumatoid fact SerPl-aCnc: <10 [IU]/mL (ref <14)

## 2023-02-19 LAB — ANA: Anti Nuclear Antibody (ANA): POSITIVE — AB

## 2023-02-19 LAB — ANTI-NUCLEAR AB-TITER (ANA TITER)
ANA TITER: 1:40 {titer} — ABNORMAL HIGH
ANA Titer 1: 1:320 {titer} — ABNORMAL HIGH

## 2023-02-19 MED ORDER — GABAPENTIN 100 MG PO CAPS: 100.0000 mg | ORAL_CAPSULE | Freq: Three times a day (TID) | ORAL | 3 refills | Status: DC

## 2023-02-19 NOTE — Telephone Encounter (Signed)
Pt states she is still in a lot of pain and no one has called her. Pt is asking for a call back, as soon as possible. Pt is scheduled to come in on Monday, 02/23/23.

## 2023-02-23 ENCOUNTER — Ambulatory Visit: Payer: Medicaid Other | Admitting: Family Medicine

## 2023-02-23 NOTE — Telephone Encounter (Signed)
See my chart encounters

## 2023-02-25 ENCOUNTER — Ambulatory Visit (HOSPITAL_COMMUNITY): Admission: RE | Admit: 2023-02-25 | Payer: Medicaid Other | Source: Ambulatory Visit

## 2023-02-25 ENCOUNTER — Encounter (HOSPITAL_COMMUNITY): Payer: Self-pay

## 2023-03-01 ENCOUNTER — Encounter: Payer: Self-pay | Admitting: Family Medicine

## 2023-03-02 NOTE — Telephone Encounter (Signed)
Attempted to pt twice. Left a voicemail to call us back.

## 2023-03-03 ENCOUNTER — Ambulatory Visit: Payer: Medicaid Other | Admitting: Family Medicine

## 2023-03-03 ENCOUNTER — Other Ambulatory Visit: Payer: Self-pay | Admitting: Cardiovascular Disease

## 2023-03-06 DIAGNOSIS — Z5181 Encounter for therapeutic drug level monitoring: Secondary | ICD-10-CM | POA: Diagnosis not present

## 2023-03-06 DIAGNOSIS — F3170 Bipolar disorder, currently in remission, most recent episode unspecified: Secondary | ICD-10-CM | POA: Diagnosis not present

## 2023-03-06 DIAGNOSIS — F411 Generalized anxiety disorder: Secondary | ICD-10-CM | POA: Diagnosis not present

## 2023-03-06 DIAGNOSIS — F4312 Post-traumatic stress disorder, chronic: Secondary | ICD-10-CM | POA: Diagnosis not present

## 2023-03-06 DIAGNOSIS — F410 Panic disorder [episodic paroxysmal anxiety] without agoraphobia: Secondary | ICD-10-CM | POA: Diagnosis not present

## 2023-03-09 ENCOUNTER — Encounter: Payer: Self-pay | Admitting: Family Medicine

## 2023-03-09 ENCOUNTER — Telehealth: Payer: Self-pay

## 2023-03-09 NOTE — Telephone Encounter (Signed)
Attempted to reach pt. Left a voicemail to call us back.  

## 2023-03-09 NOTE — Telephone Encounter (Signed)
Attempted to reach pt to schedule a visit to discuss to provider. Went straight to [employer].

## 2023-03-09 NOTE — Telephone Encounter (Signed)
Patient is calling stating that she is gaining weight on the humalog today she weighed 203 lb is is complaining of her abdomin being tight she is asking for an alternative

## 2023-03-09 NOTE — Telephone Encounter (Signed)
Patient has an appointment on Wednesday and is aware

## 2023-03-12 ENCOUNTER — Encounter: Payer: Self-pay | Admitting: "Endocrinology

## 2023-03-12 ENCOUNTER — Ambulatory Visit: Payer: Medicaid Other | Admitting: "Endocrinology

## 2023-03-12 VITALS — BP 122/74 | HR 76 | Ht 63.0 in | Wt 201.2 lb

## 2023-03-12 DIAGNOSIS — E1165 Type 2 diabetes mellitus with hyperglycemia: Secondary | ICD-10-CM

## 2023-03-12 DIAGNOSIS — Z794 Long term (current) use of insulin: Secondary | ICD-10-CM | POA: Diagnosis not present

## 2023-03-12 DIAGNOSIS — E782 Mixed hyperlipidemia: Secondary | ICD-10-CM

## 2023-03-12 MED ORDER — OZEMPIC (0.25 OR 0.5 MG/DOSE) 2 MG/1.5ML ~~LOC~~ SOPN: 0.2500 mg | PEN_INJECTOR | SUBCUTANEOUS | 0 refills | Status: AC

## 2023-03-12 NOTE — Progress Notes (Signed)
Outpatient Endocrinology Note [place], [place]  03/12/23   Emily [name] May 13, 1980 [id number]  Referring Provider: Alveria Apley, NP Primary Care Provider: Alveria Apley, NP Reason for consultation: Subjective   Assessment & Plan  Diagnoses and all orders for this visit:  Uncontrolled type 2 diabetes mellitus with hyperglycemia (HCC)  Long-term insulin use (HCC)  Mixed hypercholesterolemia and hypertriglyceridemia  Other orders -     Semaglutide,0.25 or 0.5MG /DOS, (OZEMPIC, 0.25 OR 0.5 MG/DOSE,) 2 MG/1.5ML SOPN; Inject 0.25 mg into the skin once a week.   Diabetes Type II complicated by neuropathy Lab Results  Component Value Date   GFR 108.36 01/22/2023   Hba1c goal less than 7, current Hba1c is  Lab Results  Component Value Date   HGBA1C 11.0 (H) 01/16/2023   Will recommend the following: Lantus 22-25 units once every morning Humalog 10 units 15 min before each meal   Pioglitazone 15 mg qd Ozempic 0.25mg /week (wants to lose weight and come off of Humalog; understands risk of pancreatitis and will stop ozempic on signs of pancreatitis;last Tg 340 on 03/06/23)  No known contraindications/side effects to any of above medications No history of MEN syndrome/medullary thyroid cancer/pancreatitis or pancreatic cancer in self or family Tried Trulicity-reports head ache; tried victoza/mounjaro-causea nausea/vomiting Reports trying even slow release metformin but d/c due to diarrhea   -Last LD and Tg are as follows: Lab Results  Component Value Date   LDLCALC 102 (H) 07/16/2021    Lab Results  Component Value Date   TRIG (H) 01/16/2023    411.0 Triglyceride is over 400; calculations on Lipids are invalid.   -not on statin (has cirrhosis per pt, atorvastatin 40 mg was stopped by provider) -Follow low fat diet and exercise   -Blood pressure goal <140/90 - Microalbumin/creatinine goal is < 30 -Last MA/Cr is as follows: Lab Results   Component Value Date   MICROALBUR <0.7 01/16/2023   -not on ACE/ARB  -diet changes including salt restriction -limit eating outside -counseled BP targets per standards of diabetes care -uncontrolled blood pressure can lead to retinopathy, nephropathy and cardiovascular and atherosclerotic heart disease  Reviewed and counseled on: -A1C target -Blood sugar targets -Complications of uncontrolled diabetes  -Checking blood sugar before meals and bedtime and bring log next visit -All medications with mechanism of action and side effects -Hypoglycemia management: rule of 15's, Glucagon Emergency Kit and medical alert ID -low-carb low-fat plate-method diet -At least 20 minutes of physical activity per day -Annual dilated retinal eye exam and foot exam -compliance and follow up needs -follow up as scheduled or earlier if problem gets worse  Call if blood sugar is less than 70 or consistently above 250    Take a 15 gm snack of carbohydrate at bedtime before you go to sleep if your blood sugar is less than 100.    If you are going to fast after midnight for a test or procedure, ask your physician for instructions on how to reduce/decrease your insulin dose.    Call if blood sugar is less than 70 or consistently above 250  -Treating a low sugar by rule of 15  (15 gms of sugar every 15 min until sugar is more than 70) If you feel your sugar is low, test your sugar to be sure If your sugar is low (less than 70), then take 15 grams of a fast acting Carbohydrate (3-4 glucose tablets or glucose gel or 4 ounces of juice or regular soda) Recheck  your sugar 15 min after treating low to make sure it is more than 70 If sugar is still less than 70, treat again with 15 grams of carbohydrate          Don't drive the hour of hypoglycemia  If unconscious/unable to eat or drink by mouth, use glucagon injection or nasal spray baqsimi and call 911. Can repeat again in 15 min if still unconscious.  Return  in about 2 weeks (around 03/26/2023).   I have reviewed current medications, nurse's notes, allergies, vital signs, past medical and surgical history, family medical history, and social history for this encounter. Counseled patient on symptoms, examination findings, lab findings, imaging results, treatment decisions and monitoring and prognosis. The patient understood the recommendations and agrees with the treatment plan. All questions regarding treatment plan were fully answered.  [facility] [place], MD  03/12/23  History of Present Illness Emily [name] is a 42 y.o. year old female who presents for evaluation of Type II diabetes mellitus.  Emily [name] was first diagnosed around 2012.   Diabetes education -  Home diabetes regimen: Lantus 16 units once every morning Humalog 10 units 15 min before each meal    COMPLICATIONS -  MI/Stroke -  retinopathy -  neuropathy -  nephropathy  BLOOD SUGAR DATA  CGM Interpretation: Blood sugars reviewed today. They are elevated across the day.   Physical Exam  BP 122/74   Pulse 76   Ht 5\' 3"  (1.6 m)   Wt 201 lb 3.2 oz (91.3 kg)   SpO2 94%   BMI 35.64 kg/m    Constitutional: well developed, well nourished Head: normocephalic, atraumatic Eyes: sclera anicteric, no redness Neck: supple Lungs: normal respiratory effort Neurology: alert and oriented Skin: dry, no appreciable rashes Musculoskeletal: no appreciable defects Psychiatric: normal mood and affect Diabetic Foot Exam - Simple   No data filed      Current Medications Patient's Medications  New Prescriptions   SEMAGLUTIDE,0.25 OR 0.5MG /DOS, (OZEMPIC, 0.25 OR 0.5 MG/DOSE,) 2 MG/1.5ML SOPN    Inject 0.25 mg into the skin once a week.  Previous Medications   ACCU-CHEK AVIVA PLUS TEST STRIP    1 each by Other route 3 (three) times daily.   ACCU-CHEK FASTCLIX LANCETS MISC    USE AS DIRECTED   ALBUTEROL (VENTOLIN HFA) 108 (90 BASE) MCG/ACT INHALER    Inhale 2 puffs into the  lungs every 6 (six) hours as needed for wheezing or shortness of breath.   AMITRIPTYLINE (ELAVIL) 25 MG TABLET    Take 50 mg by mouth at bedtime.   AMLODIPINE (NORVASC) 5 MG TABLET    TAKE 1 TABLET (5 MG TOTAL) BY MOUTH DAILY.   BLOOD GLUCOSE MONITORING SUPPL (ACCU-CHEK AVIVA PLUS) W/DEVICE KIT    USE AS DIRECTED   BLOOD GLUCOSE MONITORING SUPPL DEVI    1 each by Does not apply route in the morning, at noon, and at bedtime. May substitute to any manufacturer covered by patient's insurance.   CEPHALEXIN 500 MG TABLET    Take 1 tablet (500 mg total) by mouth 3 (three) times daily.   CLONAZEPAM (KLONOPIN) 0.5 MG DISINTEGRATING TABLET    Take 0.5 mg by mouth daily as needed.   CONTINUOUS GLUCOSE RECEIVER (DEXCOM G7 RECEIVER) DEVI    1 Device by Does not apply route continuous.   CONTINUOUS GLUCOSE SENSOR (DEXCOM G7 SENSOR) MISC    1 Device by Does not apply route continuous.   DOXYCYCLINE (VIBRA-TABS) 100 MG TABLET  Take 1 tablet (100 mg total) by mouth 2 (two) times daily.   FLUTICASONE-SALMETEROL (ADVAIR) 250-50 MCG/ACT AEPB    Inhale 1 puff into the lungs every 12 (twelve) hours. Rinse after use.   GABAPENTIN (NEURONTIN) 100 MG CAPSULE    Take 1 capsule (100 mg total) by mouth 3 (three) times daily.   GLUCOSE BLOOD (BLOOD GLUCOSE TEST STRIPS) STRP    1 each by In Vitro route in the morning, at noon, and at bedtime. May substitute to any manufacturer covered by patient's insurance.   HUMALOG KWIKPEN 100 UNIT/ML KWIKPEN    Inject 10 Units into the skin 3 (three) times daily. SUB-Q 3 Times Daily   INCRUSE ELLIPTA 62.5 MCG/ACT AEPB    daily as needed.   INSULIN GLARGINE (LANTUS SOLOSTAR) 100 UNIT/ML SOLOSTAR PEN    Inject 15 Units into the skin daily. Plus sliding scale, max dose 60 units per day   IPRATROPIUM-ALBUTEROL (DUONEB) 0.5-2.5 (3) MG/3ML SOLN    Take 3 mLs by nebulization in the morning, at noon, and at bedtime.   LAMOTRIGINE (LAMICTAL) 150 MG TABLET    Take 150 mg by mouth daily.    LEVOTHYROXINE (SYNTHROID) 75 MCG TABLET    TAKE 1 TABLET BY MOUTH DAILY BEFORE BREAKFAST.   MONTELUKAST (SINGULAIR) 10 MG TABLET    Take 1 tablet (10 mg total) by mouth at bedtime.   NEBULIZERS (COMPRESSOR/NEBULIZER) MISC    Use as directed   NITROGLYCERIN (NITROSTAT) 0.4 MG SL TABLET    Place 1 tablet (0.4 mg total) under the tongue every 5 (five) minutes as needed for chest pain.   OMEPRAZOLE (PRILOSEC) 40 MG CAPSULE    Take 40 mg by mouth every morning.   PAXIL 20 MG TABLET    Take 20 mg by mouth daily.   PIOGLITAZONE (ACTOS) 15 MG TABLET    Take 1 tablet (15 mg total) by mouth daily.   PREDNISONE (DELTASONE) 10 MG TABLET    4 x 1 day, 3 x 1 day, 2 x 1 day, 1 x 1 day, 1/2 x 1 day then stop   PROMETHAZINE (PHENERGAN) 12.5 MG TABLET    Take 12.5 mg by mouth every 8 (eight) hours as needed.   PROPRANOLOL (INDERAL) 20 MG TABLET    TAKE 1 TABLET BY MOUTH THREE TIMES A DAY   RESPIRATORY THERAPY SUPPLIES (NEBULIZER/TUBING/MOUTHPIECE) KIT    Use as directed   TRAZODONE (DESYREL) 50 MG TABLET    Take 50-100 mg by mouth at bedtime.   ZIPRASIDONE (GEODON) 60 MG CAPSULE    Take 60 mg by mouth daily.  Modified Medications   No medications on file  Discontinued Medications   No medications on file    Allergies Allergies  Allergen Reactions   Bactrim [Sulfamethoxazole-Trimethoprim] Anaphylaxis and Other (See Comments)    Chest pain, trouble breathing   Ciprofloxacin Palpitations   Clindamycin/Lincomycin     Other reaction(s): Chest Pain   Flagyl [Metronidazole] Shortness Of Breath   Hydrocodone Other (See Comments)   Iodinated Contrast Media Swelling   Ondansetron Shortness Of Breath   Liraglutide Nausea And Vomiting   Doxycycline Nausea And Vomiting   Penicillins    Prednisone Other (See Comments)    Makes her angry and irritable    Past Medical History Past Medical History:  Diagnosis Date   Abnormal CT of liver    Anxiety    Asthma    Asthma    Bipolar 1 disorder (HCC)    Bipolar  disorder (HCC)    Chronic pain    Cirrhosis (HCC)    Depression    Diabetes mellitus without complication (HCC)    Fatty liver    Gallstones    GERD (gastroesophageal reflux disease)    Headache(784.0)    Hyperlipidemia    Hypertension    Hypothyroidism    Pulmonary nodule    Tachycardia    Thyroid disease    Vitamin D deficiency     Past Surgical History Past Surgical History:  Procedure Laterality Date   DILATION AND CURETTAGE OF UTERUS     1 yr ago   UPPER GI ENDOSCOPY  08/28/2022    Family History family history includes Asthma in her mother; Diabetes in her maternal grandmother; Hypertension in her maternal grandmother; Valvular heart disease in her father.  Social History Social History   Socioeconomic History   Marital status: Single    Spouse name: Not on file   Number of children: 0   Years of education: Not on file   Highest education level: 9th grade  Occupational History   Not on file  Tobacco Use   Smoking status: Every Day    Current packs/day: 1.00    Average packs/day: 1 pack/day for 20.0 years (20.0 ttl pk-yrs)    Types: Cigarettes   Smokeless tobacco: Never   Tobacco comments:    Smoking a pack per day.  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   Drug use: No   Sexual activity: Not Currently    Partners: Male    Birth control/protection: Abstinence  Other Topics Concern   Not on file  Social History Narrative   Not on file   Social Determinants of Health   Financial Resource Strain: Low Risk  (02/21/2023)   Overall Financial Resource Strain (CARDIA)    Difficulty of Paying Living Expenses: Not very hard  Food Insecurity: Patient Declined (02/21/2023)   Hunger Vital Sign    Worried About Running Out of Food in the Last Year: Patient declined    Ran Out of Food in the Last Year: Patient declined  Transportation Needs: Patient Declined (02/21/2023)    PRAPARE - Administrator, Civil Service (Medical): Patient declined    Lack of Transportation (Non-Medical): Patient declined  Physical Activity: Inactive (02/21/2023)   Exercise Vital Sign    Days of Exercise per Week: 0 days    Minutes of Exercise per Session: 0 min  Stress: No Stress Concern Present (02/21/2023)   [employer] of Occupational Health - Occupational Stress Questionnaire    Feeling of Stress : Only a little  Social Connections: Socially Isolated (02/21/2023)   Social Connection and Isolation Panel [NHANES]    Frequency of Communication with Friends and Family: More than three times a week    Frequency of Social Gatherings with Friends and Family: Never    Attends Religious Services: Never    Database administrator or Organizations: No    Attends Banker Meetings: Never    Marital Status: Never married  Intimate Partner Violence: Not At Risk (11/07/2022)   Received from [facility]   HITS    Over the last 12 months how often did your partner physically hurt you?: Never    Over the last 12 months how often did your partner insult you or talk down to you?: Never  Over the last 12 months how often did your partner threaten you with physical harm?: Never    Over the last 12 months how often did your partner scream or curse at you?: Never    Lab Results  Component Value Date   HGBA1C 11.0 (H) 01/16/2023   HGBA1C 9.7 (H) 09/24/2022   HGBA1C 6.2 (H) 02/20/2015   Lab Results  Component Value Date   CHOL 223 (H) 01/16/2023   Lab Results  Component Value Date   HDL 34.80 (L) 01/16/2023   Lab Results  Component Value Date   LDLCALC 102 (H) 07/16/2021   Lab Results  Component Value Date   TRIG (H) 01/16/2023    411.0 Triglyceride is over 400; calculations on Lipids are invalid.   Lab Results  Component Value Date   CHOLHDL 6 01/16/2023   Lab Results  Component Value Date   CREATININE 0.68 01/22/2023   Lab Results   Component Value Date   GFR 108.36 01/22/2023   Lab Results  Component Value Date   MICROALBUR <0.7 01/16/2023      Component Value Date/Time   NA 129 (L) 01/22/2023 1335   NA 135 05/02/2022 1617   K 4.3 01/22/2023 1335   CL 96 01/22/2023 1335   CO2 26 01/22/2023 1335   GLUCOSE 319 (H) 01/22/2023 1335   BUN 5 (L) 01/22/2023 1335   BUN 5 (L) 05/02/2022 1617   CREATININE 0.68 01/22/2023 1335   CREATININE 0.60 02/20/2015 0001   CALCIUM 8.8 01/22/2023 1335   PROT 7.5 01/16/2023 1041   PROT 7.0 07/16/2021 1412   ALBUMIN 3.9 01/16/2023 1041   ALBUMIN 3.6 (L) 07/16/2021 1412   AST 57 (H) 01/16/2023 1041   ALT 42 (H) 01/16/2023 1041   ALKPHOS 149 (H) 01/16/2023 1041   BILITOT 1.1 01/16/2023 1041   BILITOT 0.7 07/16/2021 1412   GFRNONAA >60 05/23/2022 0301   GFRAA 129 06/01/2018 1114      Latest Ref Rng & Units 01/22/2023    1:35 PM 01/16/2023   10:41 AM 12/01/2022    2:26 PM  BMP  Glucose 70 - 99 mg/dL 347  425  956   BUN 6 - 23 mg/dL 5  5  6    Creatinine 0.40 - 1.20 mg/dL 3.87  5.64  3.32   Sodium 135 - 145 mEq/L 129  133  126   Potassium 3.5 - 5.1 mEq/L 4.3  4.0  3.9   Chloride 96 - 112 mEq/L 96  97  94   CO2 19 - 32 mEq/L 26  28  25    Calcium 8.4 - 10.5 mg/dL 8.8  9.0  8.9        Component Value Date/Time   WBC 11.8 (H) 10/02/2022 1513   RBC 3.67 (L) 10/02/2022 1513   HGB 13.9 10/02/2022 1513   HGB 11.9 05/02/2022 1617   HCT 42.3 10/02/2022 1513   HCT 33.9 (L) 05/02/2022 1617   PLT 234.0 10/02/2022 1513   PLT 221 05/02/2022 1617   MCV 115.2 Repeated and verified X2. (H) 10/02/2022 1513   MCV 115 (H) 05/02/2022 1617   MCH 41.7 (H) 05/23/2022 0301   MCHC 32.9 10/02/2022 1513   RDW 14.5 10/02/2022 1513   RDW 11.9 05/02/2022 1617   LYMPHSABS 4.7 (H) 10/02/2022 1513   MONOABS 0.4 10/02/2022 1513   EOSABS 0.3 10/02/2022 1513   BASOSABS 0.1 10/02/2022 1513     Parts of this note may have been dictated using voice recognition software.  There may be variances in  spelling and vocabulary which are unintentional. Not all errors are proofread. Please notify the Thereasa Parkin if any discrepancies are noted or if the meaning of any statement is not clear.

## 2023-03-13 ENCOUNTER — Other Ambulatory Visit (HOSPITAL_COMMUNITY): Payer: Self-pay

## 2023-03-13 ENCOUNTER — Telehealth: Payer: Self-pay

## 2023-03-13 ENCOUNTER — Other Ambulatory Visit: Payer: Self-pay | Admitting: Adult Health

## 2023-03-13 MED ORDER — AZITHROMYCIN 250 MG PO TABS: ORAL_TABLET | ORAL | 0 refills | Status: DC

## 2023-03-13 NOTE — Telephone Encounter (Signed)
Pharmacy Patient Advocate Encounter   Received notification from CoverMyMeds that prior authorization for Ozempic is required/requested.   Insurance verification completed.   The patient is insured through [facility] .   Per test claim: PA required; PA submitted to above mentioned insurance via CoverMyMeds Key/confirmation #/EOC [id number] Status is pending

## 2023-03-13 NOTE — Progress Notes (Signed)
Will rx azithromycin.

## 2023-03-16 NOTE — Telephone Encounter (Signed)
Pharmacy Patient Advocate Encounter  Received notification from [facility], [facility], [facility] that Prior Authorization for Ozempic has been APPROVED from 03/13/23 to 03/12/24   PA #/Case ID/Reference #: [id number]

## 2023-03-18 ENCOUNTER — Other Ambulatory Visit: Payer: Self-pay | Admitting: *Deleted

## 2023-03-18 ENCOUNTER — Other Ambulatory Visit: Payer: Self-pay

## 2023-03-18 DIAGNOSIS — E1165 Type 2 diabetes mellitus with hyperglycemia: Secondary | ICD-10-CM

## 2023-03-18 DIAGNOSIS — E039 Hypothyroidism, unspecified: Secondary | ICD-10-CM

## 2023-03-18 MED ORDER — LEVOTHYROXINE SODIUM 75 MCG PO TABS: 75.0000 ug | ORAL_TABLET | Freq: Every day | ORAL | 0 refills | Status: DC

## 2023-03-18 MED ORDER — DEXCOM G7 SENSOR MISC: 1.0000 | 0 refills | Status: DC

## 2023-03-19 ENCOUNTER — Encounter: Payer: Self-pay | Admitting: Internal Medicine

## 2023-03-20 NOTE — Telephone Encounter (Signed)
Please refer to message that was sent to Dr. Marchelle Gearing 11/28.

## 2023-03-22 DIAGNOSIS — R072 Precordial pain: Secondary | ICD-10-CM | POA: Diagnosis not present

## 2023-03-22 DIAGNOSIS — I517 Cardiomegaly: Secondary | ICD-10-CM | POA: Diagnosis not present

## 2023-03-22 DIAGNOSIS — R079 Chest pain, unspecified: Secondary | ICD-10-CM | POA: Diagnosis not present

## 2023-03-22 DIAGNOSIS — I10 Essential (primary) hypertension: Secondary | ICD-10-CM | POA: Diagnosis not present

## 2023-03-22 DIAGNOSIS — R918 Other nonspecific abnormal finding of lung field: Secondary | ICD-10-CM | POA: Diagnosis not present

## 2023-03-22 DIAGNOSIS — R110 Nausea: Secondary | ICD-10-CM | POA: Diagnosis not present

## 2023-03-22 DIAGNOSIS — R059 Cough, unspecified: Secondary | ICD-10-CM | POA: Diagnosis not present

## 2023-03-22 DIAGNOSIS — I2510 Atherosclerotic heart disease of native coronary artery without angina pectoris: Secondary | ICD-10-CM | POA: Diagnosis not present

## 2023-03-22 DIAGNOSIS — R002 Palpitations: Secondary | ICD-10-CM | POA: Diagnosis not present

## 2023-03-23 ENCOUNTER — Telehealth: Payer: Self-pay | Admitting: Cardiology

## 2023-03-23 ENCOUNTER — Encounter: Payer: Self-pay | Admitting: Nurse Practitioner

## 2023-03-23 ENCOUNTER — Ambulatory Visit: Payer: Medicaid Other | Attending: Nurse Practitioner | Admitting: Nurse Practitioner

## 2023-03-23 VITALS — BP 114/66 | HR 68 | Ht 63.0 in | Wt 195.0 lb

## 2023-03-23 DIAGNOSIS — Z794 Long term (current) use of insulin: Secondary | ICD-10-CM | POA: Diagnosis not present

## 2023-03-23 DIAGNOSIS — R002 Palpitations: Secondary | ICD-10-CM

## 2023-03-23 DIAGNOSIS — E039 Hypothyroidism, unspecified: Secondary | ICD-10-CM | POA: Diagnosis not present

## 2023-03-23 DIAGNOSIS — R918 Other nonspecific abnormal finding of lung field: Secondary | ICD-10-CM | POA: Diagnosis not present

## 2023-03-23 DIAGNOSIS — I517 Cardiomegaly: Secondary | ICD-10-CM | POA: Diagnosis not present

## 2023-03-23 DIAGNOSIS — I2510 Atherosclerotic heart disease of native coronary artery without angina pectoris: Secondary | ICD-10-CM

## 2023-03-23 DIAGNOSIS — R079 Chest pain, unspecified: Secondary | ICD-10-CM | POA: Diagnosis not present

## 2023-03-23 DIAGNOSIS — I10 Essential (primary) hypertension: Secondary | ICD-10-CM

## 2023-03-23 DIAGNOSIS — Z720 Tobacco use: Secondary | ICD-10-CM | POA: Diagnosis not present

## 2023-03-23 DIAGNOSIS — E118 Type 2 diabetes mellitus with unspecified complications: Secondary | ICD-10-CM

## 2023-03-23 DIAGNOSIS — E785 Hyperlipidemia, unspecified: Secondary | ICD-10-CM

## 2023-03-23 NOTE — Telephone Encounter (Signed)
Pt called in to answer machine service to inform us her nebulizer stopped working and she needs a new one. She depends on it to breathe

## 2023-03-23 NOTE — Telephone Encounter (Signed)
Called and spoke to patient who was seen in the ED yesterday for chest pain and tightness. She was also having palpitations and report her BP was elevated. Patient deny SOB,dizziness, CP or blurred vision. She does continue to have palpitations at this time. Patient is requesting an appt to be seen. Appt scheduled with Bernadene Person, NP for today at 3:10.

## 2023-03-23 NOTE — Telephone Encounter (Signed)
  Pt went to the ED yesterday and was told to call her heart doctor today. She said, per ED everything is good but she felt like there's something wrong with her heart and been not feeling good. She denied, CP and SOB

## 2023-03-23 NOTE — Progress Notes (Signed)
Office Visit    Patient Name: Emily [name] Date of Encounter: 03/23/2023  Primary Care Provider:  Alveria Apley, NP Primary Cardiologist:  Rollene Rotunda, MD  Chief Complaint    42 year old female with a history of coronary artery calcification noted on CT, chest pain, palpitations, hypertension, hyperlipidemia, hypothyroidism, type 2 diabetes, alcoholic cirrhosis of the liver, bipolar disorder, anxiety, asthma, tobacco use and GERD who presents for hospital follow-up related to chest pain and palpitations.  Past Medical History    Past Medical History:  Diagnosis Date   Abnormal CT of liver    Anxiety    Asthma    Asthma    Bipolar 1 disorder (HCC)    Bipolar disorder (HCC)    Chronic pain    Cirrhosis (HCC)    Depression    Diabetes mellitus without complication (HCC)    Fatty liver    Gallstones    GERD (gastroesophageal reflux disease)    Headache(784.0)    Hyperlipidemia    Hypertension    Hypothyroidism    Pulmonary nodule    Tachycardia    Thyroid disease    Vitamin D deficiency    Past Surgical History:  Procedure Laterality Date   DILATION AND CURETTAGE OF UTERUS     1 yr ago   UPPER GI ENDOSCOPY  08/28/2022    Allergies  Allergies  Allergen Reactions   Bactrim [Sulfamethoxazole-Trimethoprim] Anaphylaxis and Other (See Comments)    Chest pain, trouble breathing   Ciprofloxacin Palpitations   Clindamycin/Lincomycin     Other reaction(s): Chest Pain   Flagyl [Metronidazole] Shortness Of Breath   Hydrocodone Other (See Comments)   Iodinated Contrast Media Swelling   Ondansetron Shortness Of Breath   Liraglutide Nausea And Vomiting   Doxycycline Nausea And Vomiting   Penicillins    Prednisone Other (See Comments)    Makes her angry and irritable     Labs/Other Studies Reviewed    The following studies were reviewed today:  Cardiac Studies & Procedures     STRESS TESTS  MYOCARDIAL PERFUSION IMAGING 08/13/2020  Narrative   Nuclear stress EF: 64%.  There was no ST segment deviation noted during stress.  The left ventricular ejection fraction is normal (55-65%).  The study is normal.  This is a low risk study.   ECHOCARDIOGRAM  ECHOCARDIOGRAM COMPLETE 12/16/2022  Narrative ECHOCARDIOGRAM REPORT    Patient Name:   Emily [name] Date of Exam: 12/16/2022 Medical Rec #:  [id number]         Height:       63.0 in Accession #:    [id number]        Weight:       200.6 lb Date of Birth:  1980/05/06         BSA:          1.936 m Patient Age:    42 years          BP:           130/97 mmHg Patient Gender: F                 HR:           68 bpm. Exam Location:  [address]  Procedure: 2D Echo, 3D Echo, Cardiac Doppler, Color Doppler and Strain Analysis  Indications:    R06.00 Dyspnea  History:        Patient has prior history of Echocardiogram examinations, most recent 07/25/2021. Signs/Symptoms:Chest Pain; Risk Factors:Current Smoker  and Hypertension. Cardiomegaly.  Sonographer:    Clearence Ped RCS Referring Phys: [id number] MURALI RAMASWAMY  IMPRESSIONS   1. Left ventricular ejection fraction, by estimation, is 60 to 65%. The left ventricle has normal function. The left ventricle has no regional wall motion abnormalities. Left ventricular diastolic parameters were normal. 2. Right ventricular systolic function is normal. The right ventricular size is normal. 3. The mitral valve is normal in structure. No evidence of mitral valve regurgitation. No evidence of mitral stenosis. 4. The aortic valve is grossly normal. Aortic valve regurgitation is not visualized. No aortic stenosis is present. 5. The inferior vena cava is normal in size with greater than 50% respiratory variability, suggesting right atrial pressure of 3 mmHg.  Comparison(s): No significant change from prior study.  Conclusion(s)/Recommendation(s): Normal biventricular function without evidence of hemodynamically significant valvular heart  disease.  FINDINGS Left Ventricle: Left ventricular ejection fraction, by estimation, is 60 to 65%. The left ventricle has normal function. The left ventricle has no regional wall motion abnormalities. The left ventricular internal cavity size was normal in size. There is no left ventricular hypertrophy. Left ventricular diastolic parameters were normal.  Right Ventricle: The right ventricular size is normal. No increase in right ventricular wall thickness. Right ventricular systolic function is normal.  Left Atrium: Left atrial size was normal in size.  Right Atrium: Right atrial size was normal in size.  Pericardium: There is no evidence of pericardial effusion.  Mitral Valve: The mitral valve is normal in structure. No evidence of mitral valve regurgitation. No evidence of mitral valve stenosis.  Tricuspid Valve: The tricuspid valve is normal in structure. Tricuspid valve regurgitation is trivial. No evidence of tricuspid stenosis.  Aortic Valve: The aortic valve is grossly normal. Aortic valve regurgitation is not visualized. No aortic stenosis is present.  Pulmonic Valve: The pulmonic valve was not well visualized. Pulmonic valve regurgitation is not visualized.  Aorta: The aortic root, ascending aorta, aortic arch and descending aorta are all structurally normal, with no evidence of dilitation or obstruction.  Venous: The inferior vena cava is normal in size with greater than 50% respiratory variability, suggesting right atrial pressure of 3 mmHg.  IAS/Shunts: The atrial septum is grossly normal.   LEFT VENTRICLE PLAX 2D LVIDd:         5.20 cm   Diastology LVIDs:         3.40 cm   LV e' medial:    9.25 cm/s LV PW:         0.80 cm   LV E/e' medial:  9.7 LV IVS:        0.60 cm   LV e' lateral:   12.70 cm/s LVOT diam:     1.90 cm   LV E/e' lateral: 7.1 LV SV:         57 LV SV Index:   30 LVOT Area:     2.84 cm  3D Volume EF: 3D EF:        56 % LV EDV:       112 ml LV  ESV:       49 ml LV SV:        63 ml  RIGHT VENTRICLE RV Basal diam:  2.40 cm RV S prime:     11.50 cm/s TAPSE (M-mode): 2.2 cm  LEFT ATRIUM             Index        RIGHT ATRIUM  Index LA diam:        3.00 cm 1.55 cm/m   RA Area:     9.08 cm LA Vol (A2C):   45.4 ml 23.45 ml/m  RA Volume:   17.80 ml 9.19 ml/m LA Vol (A4C):   26.3 ml 13.58 ml/m LA Biplane Vol: 35.8 ml 18.49 ml/m AORTIC VALVE LVOT Vmax:   92.60 cm/s LVOT Vmean:  61.900 cm/s LVOT VTI:    0.202 m  AORTA Ao Root diam: 3.10 cm Ao Asc diam:  2.90 cm  MITRAL VALVE MV Area (PHT):             SHUNTS MV Decel Time:             Systemic VTI:  0.20 m MV E velocity: 89.60 cm/s  Systemic Diam: 1.90 cm MV A velocity: 89.60 cm/s MV E/A ratio:  1.00  Jodelle Red MD Electronically signed by Jodelle Red MD Signature Date/Time: 12/16/2022/4:52:35 PM    Final    MONITORS  LONG TERM MONITOR (3-14 DAYS) 01/17/2021  Narrative  Sinus rhythm  Rare PACs and rare PVC  No significant arrhythmias observed   Patch Wear Time:  9 days and 4 hours (2022-09-16T15:53:39-0400 to 2022-09-25T20:15:53-0400)  Patient had a min HR of 60 bpm, max HR of 123 bpm, and avg HR of 78 bpm. Predominant underlying rhythm was Sinus Rhythm. Isolated SVEs were rare (<1.0%), and no SVE Couplets or SVE Triplets were present. Isolated VEs were rare (<1.0%), and no VE Couplets or VE Triplets were present.   CT SCANS  CT CORONARY MORPH W/CTA COR W/SCORE 03/12/2022  Addendum 03/13/2022  7:15 PM ADDENDUM REPORT: 03/13/2022 19:13  HISTORY: Chest pain  EXAM: Cardiac/Coronary CT  TECHNIQUE: The patient was scanned on a [employer].  PROTOCOL: A 120 kV prospective scan was triggered in the descending thoracic aorta at 111 HU's. Axial non-contrast 3 mm slices were carried out through the heart. The data set was analyzed on a dedicated work station and scored using the Agatston method. Gantry rotation  speed was 250 msecs and collimation was .6 mm. Heart rate was optimized medically and sl NTG was given. The 3D data set was reconstructed in 5% intervals of the 35-75 % of the R-R cycle. Systolic and diastolic phases were analyzed on a dedicated work station using MPR, MIP and VRT modes. The patient received OMNIPAQUE IOHEXOL 350 MG/ML SOLN of contrast.  FINDINGS: Coronary calcium score: The patient's coronary artery calcium score is 56, which places the patient in the 99th percentile.  Coronary arteries: Normal coronary origins.  Right dominance.  Right Coronary Artery: Normal caliber vessel, gives rise to PDA. No significant plaque or stenosis.  Left Main Coronary Artery: Not visualized on contrast images due to slab artifact. Appears to have normal origin on noncontrast images.  Left Anterior Descending Coronary Artery: Ostial LAD not visualized due to slab artifact. Proximal LAD with mixed calcified and noncalcified plaque with 1-24% stenosis. Gives rise to 2 small diagonal branches.  Left Circumflex Artery: Normal caliber vessel. Proximal vessel with mixed calcified and noncalcified plaque with 1-24% stenosis. Gives rise to 2 OM branches.  Aorta: Normal size, 28 mm at the mid ascending aorta (level of the PA bifurcation) measured double oblique. No aortic atherosclerosis. No dissection seen in visualized portions of the aorta.  Aortic Valve: No calcifications. Trileaflet.  Other findings:  Normal pulmonary vein drainage into the left atrium.  Normal left atrial appendage without a thrombus.  Normal size of  the pulmonary artery.  Normal appearance of the pericardium.  Significant slab artifact.  IMPRESSION: 1. CAD-RADS N: Non-diagnostic study. Obstructive CAD can't be excluded in left main due to significant slab artifact.  2. Coronary calcium score of 56. This was 99th percentile for age and sex matched control.  3. Based on calcium score images, there  is normal coronary origin/anatomy. However, on contrast images, there is significant slab artifact, and the left main and ostial LAD cannot be visualized.  INTERPRETATION:  CAD-RADS N: Non-diagnostic study. Obstructive CAD can't be excluded. Alternative evaluation is recommended.   Electronically Signed By: Jodelle Red M.D. On: 03/13/2022 19:13  Narrative EXAM: OVER-READ INTERPRETATION  CT CHEST  The following report is a limited chest CT over-read performed by radiologist Dr. Allegra Lai of [facility] on 03/12/2022. This over-read does not include interpretation of cardiac or coronary anatomy or pathology. The coronary CTA interpretation by the cardiologist is attached.  COMPARISON:  Chest CT dated July 25, 2020  FINDINGS: Vascular: No significant vascular findings. Normal heart size. No pericardial effusion.  Mediastinum/Nodes: No enlarged mediastinal or axillary lymph nodes. Thyroid gland, trachea, and esophagus demonstrate no significant findings.  Lungs/Pleura: Lungs are clear. No pleural effusion or pneumothorax. Solid pulmonary nodule of the right middle lobe measuring 4 mm on series 11, image 25, stable when compared with prior exam, further follow-up imaging is needed given greater than 1 year stability.  Upper Abdomen: No acute abnormality.  Musculoskeletal: No chest wall mass or suspicious bone lesions identified.  IMPRESSION: No acute extracardiac findings.  Electronically Signed: By: Allegra Lai M.D. On: 03/12/2022 14:56         Recent Labs: 09/24/2022: TSH 2.24 10/02/2022: Hemoglobin 13.9; Platelets 234.0 01/16/2023: ALT 42 01/22/2023: BUN 5; Creatinine, Ser 0.68; Potassium 4.3; Sodium 129  Recent Lipid Panel    Component Value Date/Time   CHOL 223 (H) 01/16/2023 1041   CHOL 185 07/16/2021 1412   TRIG (H) 01/16/2023 1041    411.0 Triglyceride is over 400; calculations on Lipids are invalid.   HDL 34.80 (L)  01/16/2023 8295   HDL 36 (L) 07/16/2021 1412   CHOLHDL 6 01/16/2023 1041   VLDL 62.4 (H) 09/24/2022 1451   LDLCALC 102 (H) 07/16/2021 1412   LDLDIRECT 128.0 01/16/2023 1041    History of Present Illness    42 year old female with the above past medical history including coronary artery calcification noted on CT, chest pain, palpitations, hypertension, hyperlipidemia, hypothyroidism, type 2 diabetes, alcoholic cirrhosis of the liver, bipolar disorder, anxiety, asthma, tobacco use and GERD.  She has a history of coronary calcification noted on CT.  Lexiscan Myoview in 2022 was low risk, no evidence of ischemia.  Coronary calcium score in 02/2022 was 56 (99th percentile).  Echocardiogram in 11/2022 showed EF 60 to 65%, normal LV function, no RWMA, normal RV, no significant valvular abnormalities.  She was last seen in the office on 02/11/2023 and was stable from a cardiac standpoint.  She noted intermittent chest discomfort, overall atypical for angina.  She presented to the ED on 03/22/2023 in the setting of palpitations, elevated BP, chest tightness.  Troponin was negative, CT of the chest was negative for PE.  EKG showed sinus rhythm, nonspecific ST abnormality, no significant change from prior EKG. She was discharged home and advised to follow-up with cardiology as an outpatient.  She presents today for follow-up accompanied by her father.  Since her last visit and since her ED visit she has been stable  from a cardiac standpoint.  She denies any further palpitations, denies any recurrent chest pain.  She denies any exertional symptoms concerning for angina.  Home Medications    Current Outpatient Medications  Medication Sig Dispense Refill   ACCU-CHEK AVIVA PLUS test strip 1 each by Other route 3 (three) times daily.     ACCU-CHEK FASTCLIX LANCETS MISC USE AS DIRECTED 102 each 3   albuterol (VENTOLIN HFA) 108 (90 Base) MCG/ACT inhaler Inhale 2 puffs into the lungs every 6 (six) hours as needed  for wheezing or shortness of breath. 18 g 1   amitriptyline (ELAVIL) 25 MG tablet Take 50 mg by mouth at bedtime.     amLODipine (NORVASC) 5 MG tablet TAKE 1 TABLET (5 MG TOTAL) BY MOUTH DAILY. 90 tablet 3   Blood Glucose Monitoring Suppl (ACCU-CHEK AVIVA PLUS) w/Device KIT USE AS DIRECTED 1 kit 0   Blood Glucose Monitoring Suppl DEVI 1 each by Does not apply route in the morning, at noon, and at bedtime. May substitute to any manufacturer covered by patient's insurance. 1 each 0   clonazePAM (KLONOPIN) 0.5 MG disintegrating tablet Take 0.5 mg by mouth daily as needed.     Continuous Glucose Receiver (DEXCOM G7 RECEIVER) DEVI 1 Device by Does not apply route continuous. 1 each 0   Continuous Glucose Sensor (DEXCOM G7 SENSOR) MISC 1 Device by Does not apply route continuous. 9 each 0   fluticasone-salmeterol (ADVAIR) 250-50 MCG/ACT AEPB Inhale 1 puff into the lungs every 12 (twelve) hours. Rinse after use. 60 each 5   gabapentin (NEURONTIN) 100 MG capsule Take 1 capsule (100 mg total) by mouth 3 (three) times daily. 90 capsule 3   Glucose Blood (BLOOD GLUCOSE TEST STRIPS) STRP 1 each by In Vitro route in the morning, at noon, and at bedtime. May substitute to any manufacturer covered by patient's insurance. 100 each 3   INCRUSE ELLIPTA 62.5 MCG/ACT AEPB daily as needed.     insulin glargine (LANTUS SOLOSTAR) 100 UNIT/ML Solostar Pen Inject 15 Units into the skin daily. Plus sliding scale, max dose 60 units per day 15 mL 4   ipratropium-albuterol (DUONEB) 0.5-2.5 (3) MG/3ML SOLN Take 3 mLs by nebulization in the morning, at noon, and at bedtime. 270 mL 11   lamoTRIgine (LAMICTAL) 150 MG tablet Take 150 mg by mouth daily.     levothyroxine (SYNTHROID) 75 MCG tablet Take 1 tablet (75 mcg total) by mouth daily before breakfast. 90 tablet 0   montelukast (SINGULAIR) 10 MG tablet Take 1 tablet (10 mg total) by mouth at bedtime. 90 tablet 3   Nebulizers (COMPRESSOR/NEBULIZER) MISC Use as directed 1 each 0    nitroGLYCERIN (NITROSTAT) 0.4 MG SL tablet Place 1 tablet (0.4 mg total) under the tongue every 5 (five) minutes as needed for chest pain. 25 tablet 3   omeprazole (PRILOSEC) 40 MG capsule Take 40 mg by mouth every morning.     OVER THE COUNTER MEDICATION Take by mouth as needed. Maalox lidocain 2% takes 15-30 mls     PAXIL 20 MG tablet Take 20 mg by mouth daily.     predniSONE (DELTASONE) 10 MG tablet 4 x 1 day, 3 x 1 day, 2 x 1 day, 1 x 1 day, 1/2 x 1 day then stop 11 tablet 0   promethazine (PHENERGAN) 12.5 MG tablet Take 12.5 mg by mouth every 8 (eight) hours as needed.     propranolol (INDERAL) 20 MG tablet TAKE 1 TABLET BY MOUTH THREE  TIMES A DAY 270 tablet 2   Respiratory Therapy Supplies (NEBULIZER/TUBING/MOUTHPIECE) KIT Use as directed 10 kit 11   traZODone (DESYREL) 50 MG tablet Take 50-100 mg by mouth at bedtime.     ziprasidone (GEODON) 60 MG capsule Take 60 mg by mouth daily.     HUMALOG KWIKPEN 100 UNIT/ML KwikPen Inject 10 Units into the skin 3 (three) times daily. SUB-Q 3 Times Daily 15 mL 2   pioglitazone (ACTOS) 15 MG tablet Take 1 tablet (15 mg total) by mouth daily. 30 tablet 0   Semaglutide,0.25 or 0.5MG /DOS, (OZEMPIC, 0.25 OR 0.5 MG/DOSE,) 2 MG/1.5ML SOPN Inject 0.25 mg into the skin once a week. (Patient not taking: Reported on 03/23/2023) 1.5 mL 0   No current facility-administered medications for this visit.     Review of Systems    She denies chest pain, palpitations, dyspnea, pnd, orthopnea, n, v, dizziness, syncope, edema, weight gain, or early satiety. All other systems reviewed and are otherwise negative except as noted above.   Physical Exam    VS:  BP 114/66 (BP Location: Left Arm, Patient Position: Sitting, Cuff Size: Normal)   Pulse 68   Ht 5\' 3"  (1.6 m)   Wt 195 lb (88.5 kg)   SpO2 95%   BMI 34.54 kg/m  GEN: Well nourished, well developed, in no acute distress. HEENT: normal. Neck: Supple, no JVD, carotid bruits, or masses. Cardiac: RRR, no  murmurs, rubs, or gallops. No clubbing, cyanosis, edema.  Radials/DP/PT 2+ and equal bilaterally.  Respiratory:  Respirations regular and unlabored, clear to auscultation bilaterally. GI: Soft, nontender, nondistended, BS + x 4. MS: no deformity or atrophy. Skin: warm and dry, no rash. Neuro:  Strength and sensation are intact. Psych: Normal affect.  Accessory Clinical Findings    ECG personally reviewed by me today - EKG Interpretation Date/Time:  Monday March 23 2023 15:22:17 EST Ventricular Rate:  68 PR Interval:  162 QRS Duration:  102 QT Interval:  418 QTC Calculation: 444 R Axis:   86  Text Interpretation: Normal sinus rhythm Incomplete right bundle branch block Nonspecific T wave abnormality When compared with ECG of 23-May-2022 02:40, No significant change was found Confirmed by Bernadene Person ([id number]) on 03/23/2023 3:29:45 PM  - no acute changes.   Lab Results  Component Value Date   WBC 11.8 (H) 10/02/2022   HGB 13.9 10/02/2022   HCT 42.3 10/02/2022   MCV 115.2 Repeated and verified X2. (H) 10/02/2022   PLT 234.0 10/02/2022   Lab Results  Component Value Date   CREATININE 0.68 01/22/2023   BUN 5 (L) 01/22/2023   NA 129 (L) 01/22/2023   K 4.3 01/22/2023   CL 96 01/22/2023   CO2 26 01/22/2023   Lab Results  Component Value Date   ALT 42 (H) 01/16/2023   AST 57 (H) 01/16/2023   ALKPHOS 149 (H) 01/16/2023   BILITOT 1.1 01/16/2023   Lab Results  Component Value Date   CHOL 223 (H) 01/16/2023   HDL 34.80 (L) 01/16/2023   LDLCALC 102 (H) 07/16/2021   LDLDIRECT 128.0 01/16/2023   TRIG (H) 01/16/2023    411.0 Triglyceride is over 400; calculations on Lipids are invalid.   CHOLHDL 6 01/16/2023    Lab Results  Component Value Date   HGBA1C 11.0 (H) 01/16/2023    Assessment & Plan   1. Coronary artery calcification/chest pain: Evidence of coronary calcium on prior CT.  chocardiogram in 11/2022 showed EF 60 to 65%, normal LV function, no  RWMA, normal RV, no  significant valvular abnormalities.  She had an isolated episode of chest pain that occurred at rest, associated palpitations, nausea.  ED workup was unrevealing.  She denies any further chest pain, denies exertional symptoms concerning for angina.  Through shared decision making, will defer any additional testing at this time.  Continue to monitor symptoms.  If she develops further symptoms concerning for angina, consider cardiac PET stress test (she is not an ideal candidate for coronary CT angiogram in the setting of contrast media allergy). Continue amlodipine, propanolol.    2. Palpitations: Recent episode of palpitations that lasted for several hours with associated chest pain and nausea.  This appears to have been an isolated event.  She denies any recurrent palpitations.  Labs were overall unremarkable, she does have chronic hyponatremia and is pending referral to nephrology.  TSH was 1.92 in 02/2023.  Reviewed ED precautions.  Continue to monitor symptoms.  Continue propranolol.  3. Hypertension: BP well controlled. Continue current antihypertensive regimen.   4. Hyperlipidemia: dLDL was 128 in 12/2022.  Not on statin therapy at this time in the setting of cirrhosis.   5. Hypothyroidism: TSH was 1.910 02/2023.  Following with endocrinology.  6. Type 2 diabetes: A1c was 9.9 in 02/2023.  Managed per PCP.  7. Cirrhosis: Pending possible transplant in the future.  8. Tobacco use: She continues to smoke.  Full cessation advised.  9. Disposition: Follow-up in 6 months, sooner if needed.      Joylene Grapes, NP 03/23/2023, 5:02 PM

## 2023-03-23 NOTE — Patient Instructions (Signed)
Medication Instructions:  Your physician recommends that you continue on your current medications as directed. Please refer to the Current Medication list given to you today.  *If you need a refill on your cardiac medications before your next appointment, please call your pharmacy*   Lab Work: NONE ordered at this time of appointment    Testing/Procedures: NONE ordered at this time of appointment     Follow-Up: At [facility], you and your health needs are our priority.  As part of our continuing mission to provide you with exceptional heart care, we have created designated Provider Care Teams.  These Care Teams include your primary Cardiologist (physician) and Advanced Practice Providers (APPs -  Physician Assistants and Nurse Practitioners) who all work together to provide you with the care you need, when you need it.  We recommend signing up for the patient portal called "MyChart".  Sign up information is provided on this After Visit Summary.  MyChart is used to connect with patients for Virtual Visits (Telemedicine).  Patients are able to view lab/test results, encounter notes, upcoming appointments, etc.  Non-urgent messages can be sent to your provider as well.   To learn more about what you can do with MyChart, go to [email or web address].    Your next appointment:   6 month(s)  Provider:   Rollene Rotunda, MD     Other Instructions

## 2023-03-26 ENCOUNTER — Ambulatory Visit: Payer: Medicaid Other | Admitting: "Endocrinology

## 2023-03-26 ENCOUNTER — Encounter: Payer: Self-pay | Admitting: "Endocrinology

## 2023-03-28 ENCOUNTER — Other Ambulatory Visit: Payer: Self-pay | Admitting: Cardiovascular Disease

## 2023-03-30 ENCOUNTER — Encounter: Payer: Self-pay | Admitting: Cardiology

## 2023-03-31 DIAGNOSIS — Z5181 Encounter for therapeutic drug level monitoring: Secondary | ICD-10-CM | POA: Diagnosis not present

## 2023-03-31 MED ORDER — PROPRANOLOL HCL 20 MG PO TABS: 20.0000 mg | ORAL_TABLET | Freq: Three times a day (TID) | ORAL | 3 refills | Status: DC

## 2023-04-07 DIAGNOSIS — D7589 Other specified diseases of blood and blood-forming organs: Secondary | ICD-10-CM | POA: Diagnosis not present

## 2023-04-07 DIAGNOSIS — Z720 Tobacco use: Secondary | ICD-10-CM | POA: Diagnosis not present

## 2023-04-07 DIAGNOSIS — R718 Other abnormality of red blood cells: Secondary | ICD-10-CM | POA: Diagnosis not present

## 2023-04-07 DIAGNOSIS — D72820 Lymphocytosis (symptomatic): Secondary | ICD-10-CM | POA: Diagnosis not present

## 2023-04-07 DIAGNOSIS — D72828 Other elevated white blood cell count: Secondary | ICD-10-CM | POA: Diagnosis not present

## 2023-04-07 DIAGNOSIS — E039 Hypothyroidism, unspecified: Secondary | ICD-10-CM | POA: Diagnosis not present

## 2023-04-07 DIAGNOSIS — F17219 Nicotine dependence, cigarettes, with unspecified nicotine-induced disorders: Secondary | ICD-10-CM | POA: Diagnosis not present

## 2023-04-09 DIAGNOSIS — D72820 Lymphocytosis (symptomatic): Secondary | ICD-10-CM | POA: Diagnosis not present

## 2023-04-10 ENCOUNTER — Telehealth: Payer: Self-pay | Admitting: *Deleted

## 2023-04-10 ENCOUNTER — Encounter: Payer: Self-pay | Admitting: "Endocrinology

## 2023-04-10 ENCOUNTER — Other Ambulatory Visit: Payer: Self-pay | Admitting: "Endocrinology

## 2023-04-10 DIAGNOSIS — E118 Type 2 diabetes mellitus with unspecified complications: Secondary | ICD-10-CM

## 2023-04-10 NOTE — Telephone Encounter (Signed)
Converted from [employer] message:   Emily [name], Emily [name] [facility] Pool Phone Number: [phone]   I have a little cough green coming out my nose I was hoping to get a zpack before the holiday       Don't feel good (Newest Message First)April 10, 2023 Emily [name] to P [facility] Pool (supporting Julio Sicks, NP)      04/10/23 12:47 PM I have a little cough green coming out my nose I was hoping to get a zpack before the holiday

## 2023-04-11 ENCOUNTER — Other Ambulatory Visit: Payer: Self-pay

## 2023-04-11 NOTE — Telephone Encounter (Signed)
Responded to patient's Mychart message

## 2023-04-13 ENCOUNTER — Telehealth: Payer: Self-pay | Admitting: *Deleted

## 2023-04-13 NOTE — Telephone Encounter (Signed)
Patient is aware of below message/recommendations and voiced her understanding.  Nothing further needed.  

## 2023-04-13 NOTE — Telephone Encounter (Signed)
She does not need abx. She should take mucinex 600g twice daily, use flonase daily along with saline/ocean nasal spray twice daily

## 2023-04-13 NOTE — Telephone Encounter (Signed)
Spoke with the pt  She has prod cough with clear sputum  She is having some green nasal d/c  She denies any fevers, aches, chills, wheezing, SOB  She is asking for zpack  She took a covid test and it was neg today  Please advise, thanks!  Allergies  Allergen Reactions   Bactrim [Sulfamethoxazole-Trimethoprim] Anaphylaxis and Other (See Comments)    Chest pain, trouble breathing   Ciprofloxacin Palpitations   Clindamycin/Lincomycin     Other reaction(s): Chest Pain   Flagyl [Metronidazole] Shortness Of Breath   Hydrocodone Other (See Comments)   Iodinated Contrast Media Swelling   Ondansetron Shortness Of Breath   Liraglutide Nausea And Vomiting   Doxycycline Nausea And Vomiting   Penicillins    Prednisone Other (See Comments)    Makes her angry and irritable

## 2023-04-13 NOTE — Telephone Encounter (Signed)
Converted from a mychart message:  I have a little cough green coming out my nose I was hoping to get a zpack before the holiday

## 2023-04-16 ENCOUNTER — Other Ambulatory Visit: Payer: Self-pay

## 2023-04-16 DIAGNOSIS — E118 Type 2 diabetes mellitus with unspecified complications: Secondary | ICD-10-CM

## 2023-04-16 MED ORDER — INSULIN LISPRO (1 UNIT DIAL) 100 UNIT/ML (KWIKPEN): PEN_INJECTOR | SUBCUTANEOUS | 3 refills | Status: DC

## 2023-04-23 ENCOUNTER — Telehealth (HOSPITAL_COMMUNITY): Payer: Self-pay

## 2023-04-23 NOTE — Telephone Encounter (Signed)
 04/27/23 appt confirmed by pt

## 2023-04-27 ENCOUNTER — Ambulatory Visit (HOSPITAL_COMMUNITY): Payer: Medicaid Other | Admitting: Psychiatry

## 2023-04-27 ENCOUNTER — Encounter (HOSPITAL_COMMUNITY): Payer: Self-pay

## 2023-04-29 DIAGNOSIS — Z5181 Encounter for therapeutic drug level monitoring: Secondary | ICD-10-CM | POA: Diagnosis not present

## 2023-04-30 DIAGNOSIS — E119 Type 2 diabetes mellitus without complications: Secondary | ICD-10-CM | POA: Diagnosis not present

## 2023-04-30 DIAGNOSIS — E871 Hypo-osmolality and hyponatremia: Secondary | ICD-10-CM | POA: Diagnosis not present

## 2023-04-30 DIAGNOSIS — I10 Essential (primary) hypertension: Secondary | ICD-10-CM | POA: Diagnosis not present

## 2023-04-30 DIAGNOSIS — K7460 Unspecified cirrhosis of liver: Secondary | ICD-10-CM | POA: Diagnosis not present

## 2023-05-05 DIAGNOSIS — D7589 Other specified diseases of blood and blood-forming organs: Secondary | ICD-10-CM | POA: Diagnosis not present

## 2023-05-05 DIAGNOSIS — K7402 Hepatic fibrosis, advanced fibrosis: Secondary | ICD-10-CM | POA: Diagnosis not present

## 2023-05-08 ENCOUNTER — Other Ambulatory Visit: Payer: Self-pay | Admitting: "Endocrinology

## 2023-05-08 ENCOUNTER — Telehealth: Payer: Self-pay

## 2023-05-08 DIAGNOSIS — R109 Unspecified abdominal pain: Secondary | ICD-10-CM | POA: Diagnosis not present

## 2023-05-08 DIAGNOSIS — Z888 Allergy status to other drugs, medicaments and biological substances status: Secondary | ICD-10-CM | POA: Diagnosis not present

## 2023-05-08 DIAGNOSIS — I10 Essential (primary) hypertension: Secondary | ICD-10-CM | POA: Diagnosis not present

## 2023-05-08 DIAGNOSIS — R1032 Left lower quadrant pain: Secondary | ICD-10-CM | POA: Diagnosis not present

## 2023-05-08 DIAGNOSIS — Z880 Allergy status to penicillin: Secondary | ICD-10-CM | POA: Diagnosis not present

## 2023-05-08 DIAGNOSIS — R0602 Shortness of breath: Secondary | ICD-10-CM | POA: Diagnosis not present

## 2023-05-08 DIAGNOSIS — Z885 Allergy status to narcotic agent status: Secondary | ICD-10-CM | POA: Diagnosis not present

## 2023-05-08 DIAGNOSIS — R918 Other nonspecific abnormal finding of lung field: Secondary | ICD-10-CM | POA: Diagnosis not present

## 2023-05-08 DIAGNOSIS — R911 Solitary pulmonary nodule: Secondary | ICD-10-CM | POA: Diagnosis not present

## 2023-05-08 DIAGNOSIS — Z79899 Other long term (current) drug therapy: Secondary | ICD-10-CM | POA: Diagnosis not present

## 2023-05-08 DIAGNOSIS — R0600 Dyspnea, unspecified: Secondary | ICD-10-CM | POA: Diagnosis not present

## 2023-05-08 DIAGNOSIS — N3289 Other specified disorders of bladder: Secondary | ICD-10-CM | POA: Diagnosis not present

## 2023-05-08 DIAGNOSIS — R1031 Right lower quadrant pain: Secondary | ICD-10-CM | POA: Diagnosis not present

## 2023-05-08 DIAGNOSIS — F17210 Nicotine dependence, cigarettes, uncomplicated: Secondary | ICD-10-CM | POA: Diagnosis not present

## 2023-05-08 DIAGNOSIS — Z91041 Radiographic dye allergy status: Secondary | ICD-10-CM | POA: Diagnosis not present

## 2023-05-08 DIAGNOSIS — R162 Hepatomegaly with splenomegaly, not elsewhere classified: Secondary | ICD-10-CM | POA: Diagnosis not present

## 2023-05-08 DIAGNOSIS — R1013 Epigastric pain: Secondary | ICD-10-CM | POA: Diagnosis not present

## 2023-05-08 DIAGNOSIS — Z883 Allergy status to other anti-infective agents status: Secondary | ICD-10-CM | POA: Diagnosis not present

## 2023-05-08 DIAGNOSIS — R140 Abdominal distension (gaseous): Secondary | ICD-10-CM | POA: Diagnosis not present

## 2023-05-08 NOTE — Telephone Encounter (Signed)
Copied from CRM [id number]. Topic: Clinical - Lab/Test Results >> May 08, 2023 11:19 AM Deaijah H wrote: Reason for CRM: Patient called in to see if Dr. Clinton Sawyer could look at results regarding her Hepatic Function panel for liver enzymes/ please call [phone]

## 2023-05-08 NOTE — Telephone Encounter (Signed)
Spoke to pt. Pt states she had the lab yesterday with [facility].   Please see lab result in [facility] on epic and advise.

## 2023-05-09 DIAGNOSIS — R109 Unspecified abdominal pain: Secondary | ICD-10-CM | POA: Diagnosis not present

## 2023-05-09 DIAGNOSIS — N3289 Other specified disorders of bladder: Secondary | ICD-10-CM | POA: Diagnosis not present

## 2023-05-09 DIAGNOSIS — R0602 Shortness of breath: Secondary | ICD-10-CM | POA: Diagnosis not present

## 2023-05-09 DIAGNOSIS — R162 Hepatomegaly with splenomegaly, not elsewhere classified: Secondary | ICD-10-CM | POA: Diagnosis not present

## 2023-05-11 ENCOUNTER — Encounter: Payer: Self-pay | Admitting: Family Medicine

## 2023-05-11 ENCOUNTER — Telehealth: Payer: Self-pay

## 2023-05-11 NOTE — Telephone Encounter (Signed)
Patient is calling in regarding her ct scan results

## 2023-05-11 NOTE — Telephone Encounter (Signed)
Copied from CRM [id number]. Topic: Clinical - Lab/Test Results >> May 11, 2023 11:05 AM Adelina Mings wrote: Reason for CRM: looking to receive results for ct scan

## 2023-05-11 NOTE — Telephone Encounter (Signed)
Spoke with pt. And inform her of provider's advise. Pt already has a hospital follow appt tomorrow with Dr. [place]. Inform pt she can go over the reports with her along with CT scan. Advise to bring a physical copy of CT if have it.

## 2023-05-11 NOTE — Telephone Encounter (Signed)
Spoke to pt earlier. Pt has a hospital follow up with Dr. [place] tomorrow. Please see other encounter.

## 2023-05-12 ENCOUNTER — Ambulatory Visit: Payer: Medicaid Other | Admitting: Family Medicine

## 2023-05-12 ENCOUNTER — Encounter: Payer: Self-pay | Admitting: Family Medicine

## 2023-05-12 VITALS — BP 138/80 | HR 80 | Resp 12 | Ht 63.0 in | Wt 205.0 lb

## 2023-05-12 DIAGNOSIS — I10 Essential (primary) hypertension: Secondary | ICD-10-CM

## 2023-05-12 DIAGNOSIS — K7030 Alcoholic cirrhosis of liver without ascites: Secondary | ICD-10-CM

## 2023-05-12 DIAGNOSIS — R918 Other nonspecific abnormal finding of lung field: Secondary | ICD-10-CM

## 2023-05-12 DIAGNOSIS — R1084 Generalized abdominal pain: Secondary | ICD-10-CM | POA: Diagnosis not present

## 2023-05-12 DIAGNOSIS — R0602 Shortness of breath: Secondary | ICD-10-CM

## 2023-05-12 NOTE — Progress Notes (Signed)
ACUTE VISIT Chief Complaint  Patient presents with   Hospitalization Follow-up   HPI: [name] is a 43 y.o. female with a PMHx significant for HTN, pulmonary nodules, asthma, GERD, DM II, hypothyroidism, vitamin D deficiency, depression, and alcoholic liver cirrhosis among some, who is here today for ED follow up. She was evaluated in the ED on 05/08/2023 for abdominal pain and shortness of breath.  She had an abdominal/pelvic CT and a chest X-ray + extensive blood work.  She was discharged home on Macrobid.  Currently, she is still having SOB with activity and constant generalized abdominal pain. She says it is unchanged since her ER visit and started about a week ago.  She denies prior history of either one. She has not identified exacerbating or alleviating factors. Pain is about 4-5/10, not radiated.  She is also having increased urinary frequency, but denies dysuria, gross hematuria, or foam in urine.  She follows with pulmonology regularly. She does a Duoneb 0.5-2.5 mg nebulized breathing treatment 3x daily.  She also mentions she has gained 5 lbs in the last 3 weeks, she thinks it caused by Humalog, which was started about 4 weeks ago.  She is also on Lantus. Last hemoglobin A1c on 01/16/2023 was 11.0.  Negative for orthopnea, PND, or edema.  She has daily bowel movements, defecation does not help with abdominal pain. Pertinent negatives include CP, wheezing, nausea, vomiting,blood in stool,or melena.  Abdominal/pelvic CT with IV contrast on 05/08/23: No renal calculi hydronephrosis.  No bowel obstruction. Lobulated border of the liver consistent with hepatic cirrhosis.  Hepatosplenomegaly.  Wall thickening of the urinary bladder which can be seen with under distention versus cystitis.  5.1 mm subpleural left lower lobe pulmonary nodule. By the Fleischner criteria no further follow-up is recommended in the non this patient in the high risk patient a CT scan of the  chest in one year is suggested.   Lipase 57. Lactic acid normal at 1.3. ntains abnormal data CBC And Differential Order: [id number] Component Ref Range & Units 4 d ago  WBC 4.0 - 10.5 thou/mcL 10.6 High   RBC 3.93 - 5.22 million/mcL 4.00  HGB 11.2 - 15.7 gm/dL 56.3  HCT 87.5 - 64.3 % 42.2  MCV 79.4 - 94.8 fL 105.5 High   MCH 25.6 - 32.2 pg 36.5 High   MCHC 32.2 - 35.5 gm/dL 32.9  Plt Ct 518 - 841 thou/mcL 175  RDW SD 35.1 - 46.3 fL 46.6 High   MPV 9.4 - 12.4 fL 10.6  NRBC% 0.0 - 0.2 /100WBC 0.0  Absolute NRBC Count 0.00 - 0.01 thou/mcL 0.00  NEUTROPHIL % % 45.1  LYMPHOCYTE % % 46.6  MONOCYTE % % 3.8  Eosinophil % % 2.8  BASOPHIL % % 0.9  IG% % 0.8  ABSOLUTE NEUTROPHIL COUNT 1.50 - 7.50 thou/mcL 4.80  ABSOLUTE LYMPHOCYTE COUNT 1.00 - 4.50 thou/mcL 4.96 High   Absolute Monocyte Count 0.10 - 0.80 thou/mcL 0.40  Absolute Eosinophil Count 0.00 - 0.50 thou/mcL 0.30  Absolute Basophil Count 0.00 - 0.20 thou/mcL 0.10  Absolute Immature Granulocyte Count 0.00 - 0.03 thou/mcL 0.08 High    Comprehensive metabolic panel Order: [id number] Component Ref Range & Units 4 d ago  Na 136 - 146 mmol/L 131 Low   Potassium 3.7 - 5.4 mmol/L 4.4  Cl 97 - 108 mmol/L 94 Low   CO2 20 - 32 mmol/L 26  AGAP 7 - 16 mmol/L 11  Glucose 65 - 99 mg/dL 109 High  BUN 6 - 24 mg/dL 6  Creatinine 9.52 - 8.41 mg/dL 3.24  Ca 8.7 - 40.1 mg/dL 8.9  ALK PHOS 25 - 027 U/L 195 High   T Bili 0.0 - 1.2 mg/dL 0.6  Total Protein 6.0 - 8.5 gm/dL 8.3  Alb 3.5 - 5.5 gm/dL 3.8  GLOBULIN 1.5 - 4.5 gm/dL 4.5  ALBUMIN/GLOBULIN RATIO 1.1 - 2.5 0.8 Low   BUN/CREAT RATIO 11.0 - 26.0 8.6 Low   ALT 0 - 40 U/L 55 High   AST 0 - 40 U/L 65 High   eGFR >=60 mL/min/1.8m2 112   Troponin neg x 2. NT pro BNP < 36.  No alcohol for a year. She is still smoking.   She also follows with cardiology and endocrinology.  Hypertension on amlodipine 5 mg daily. Hypothyroidism on  levothyroxine 75 mcg daily. Last TSH 2.2 in 09/2022.  She is present concerns about pulmonary nodules, for which she been following with pulmonologist.  She states that she is not currently seeing any one for management of her liver problems. She would like a referral to [facility] liver care and transplant.   Review of Systems  Constitutional:  Positive for fatigue. Negative for appetite change, chills and fever.  HENT:  Negative for mouth sores, sore throat and trouble swallowing.   Respiratory:  Negative for cough, chest tightness and stridor.   Endocrine: Negative for cold intolerance and heat intolerance.  Musculoskeletal:  Positive for arthralgias.  Skin:  Negative for rash.  Neurological:  Negative for syncope, facial asymmetry and weakness.  Psychiatric/Behavioral:  Negative for confusion and hallucinations. The patient is nervous/anxious.   See other pertinent positives and negatives in HPI.  Current Outpatient Medications on File Prior to Visit  Medication Sig Dispense Refill   ACCU-CHEK AVIVA PLUS test strip 1 each by Other route 3 (three) times daily.     ACCU-CHEK FASTCLIX LANCETS MISC USE AS DIRECTED 102 each 3   albuterol (VENTOLIN HFA) 108 (90 Base) MCG/ACT inhaler Inhale 2 puffs into the lungs every 6 (six) hours as needed for wheezing or shortness of breath. 18 g 1   amitriptyline (ELAVIL) 25 MG tablet Take 50 mg by mouth at bedtime.     amLODipine (NORVASC) 5 MG tablet TAKE 1 TABLET (5 MG TOTAL) BY MOUTH DAILY. 90 tablet 3   Blood Glucose Monitoring Suppl (ACCU-CHEK AVIVA PLUS) w/Device KIT USE AS DIRECTED 1 kit 0   Blood Glucose Monitoring Suppl DEVI 1 each by Does not apply route in the morning, at noon, and at bedtime. May substitute to any manufacturer covered by patient's insurance. 1 each 0   clonazePAM (KLONOPIN) 0.5 MG disintegrating tablet Take 0.5 mg by mouth daily as needed.     Continuous Glucose Receiver (DEXCOM G7 RECEIVER) DEVI 1 Device by Does not  apply route continuous. 1 each 0   Continuous Glucose Sensor (DEXCOM G7 SENSOR) MISC 1 Device by Does not apply route continuous. 9 each 0   fluticasone-salmeterol (ADVAIR) 250-50 MCG/ACT AEPB Inhale 1 puff into the lungs every 12 (twelve) hours. Rinse after use. 60 each 5   gabapentin (NEURONTIN) 100 MG capsule Take 1 capsule (100 mg total) by mouth 3 (three) times daily. 90 capsule 3   Glucose Blood (BLOOD GLUCOSE TEST STRIPS) STRP 1 each by In Vitro route in the morning, at noon, and at bedtime. May substitute to any manufacturer covered by patient's insurance. 100 each 3   INCRUSE ELLIPTA 62.5 MCG/ACT AEPB daily as needed.  insulin glargine (LANTUS SOLOSTAR) 100 UNIT/ML Solostar Pen Inject 15 Units into the skin daily. Plus sliding scale, max dose 60 units per day 15 mL 4   insulin lispro (HUMALOG KWIKPEN) 100 UNIT/ML KwikPen INJECT TEN UNITS INTO THE SKIN 3 (THREE) TIMES DAILY WITH MEALS. 15 mL 3   ipratropium-albuterol (DUONEB) 0.5-2.5 (3) MG/3ML SOLN Take 3 mLs by nebulization in the morning, at noon, and at bedtime. 270 mL 11   lamoTRIgine (LAMICTAL) 150 MG tablet Take 150 mg by mouth daily.     levothyroxine (SYNTHROID) 75 MCG tablet Take 1 tablet (75 mcg total) by mouth daily before breakfast. 90 tablet 0   montelukast (SINGULAIR) 10 MG tablet Take 1 tablet (10 mg total) by mouth at bedtime. 90 tablet 3   Nebulizers (COMPRESSOR/NEBULIZER) MISC Use as directed 1 each 0   nitroGLYCERIN (NITROSTAT) 0.4 MG SL tablet Place 1 tablet (0.4 mg total) under the tongue every 5 (five) minutes as needed for chest pain. 25 tablet 3   omeprazole (PRILOSEC) 40 MG capsule Take 40 mg by mouth every morning.     OVER THE COUNTER MEDICATION Take by mouth as needed. Maalox lidocain 2% takes 15-30 mls     PAXIL 20 MG tablet Take 20 mg by mouth daily.     predniSONE (DELTASONE) 10 MG tablet 4 x 1 day, 3 x 1 day, 2 x 1 day, 1 x 1 day, 1/2 x 1 day then stop 11 tablet 0   promethazine (PHENERGAN) 12.5 MG tablet  Take 12.5 mg by mouth every 8 (eight) hours as needed.     propranolol (INDERAL) 20 MG tablet Take 1 tablet (20 mg total) by mouth 3 (three) times daily. 270 tablet 3   Respiratory Therapy Supplies (NEBULIZER/TUBING/MOUTHPIECE) KIT Use as directed 10 kit 11   traZODone (DESYREL) 50 MG tablet Take 50-100 mg by mouth at bedtime.     ziprasidone (GEODON) 60 MG capsule Take 60 mg by mouth daily.     pioglitazone (ACTOS) 15 MG tablet Take 1 tablet (15 mg total) by mouth daily. 30 tablet 0   No current facility-administered medications on file prior to visit.    Past Medical History:  Diagnosis Date   Abnormal CT of liver    Anxiety    Asthma    Asthma    Bipolar 1 disorder (HCC)    Bipolar disorder (HCC)    Chronic pain    Cirrhosis (HCC)    Depression    Diabetes mellitus without complication (HCC)    Fatty liver    Gallstones    GERD (gastroesophageal reflux disease)    Headache(784.0)    Hyperlipidemia    Hypertension    Hypothyroidism    Pulmonary nodule    Tachycardia    Thyroid disease    Vitamin D deficiency    Allergies  Allergen Reactions   Bactrim [Sulfamethoxazole-Trimethoprim] Anaphylaxis and Other (See Comments)    Chest pain, trouble breathing   Ciprofloxacin Palpitations   Clindamycin/Lincomycin     Other reaction(s): Chest Pain   Flagyl [Metronidazole] Shortness Of Breath   Hydrocodone Other (See Comments)   Iodinated Contrast Media Swelling   Ondansetron Shortness Of Breath   Liraglutide Nausea And Vomiting   Doxycycline Nausea And Vomiting   Penicillins    Prednisone Other (See Comments)    Makes her angry and irritable    Social History   Socioeconomic History   Marital status: Single    Spouse name: Not on file  Number of children: 0   Years of education: Not on file   Highest education level: 9th grade  Occupational History   Not on file  Tobacco Use   Smoking status: Every Day    Current packs/day: 1.00    Average packs/day: 1  pack/day for 20.0 years (20.0 ttl pk-yrs)    Types: Cigarettes   Smokeless tobacco: Never   Tobacco comments:    Smoking a pack per day.  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   Drug use: No   Sexual activity: Not Currently    Partners: Male    Birth control/protection: Abstinence  Other Topics Concern   Not on file  Social History Narrative   Not on file   Social Drivers of Health   Financial Resource Strain: Low Risk  (02/21/2023)   Overall Financial Resource Strain (CARDIA)    Difficulty of Paying Living Expenses: Not very hard  Food Insecurity: Patient Declined (02/21/2023)   Hunger Vital Sign    Worried About Running Out of Food in the Last Year: Patient declined    Ran Out of Food in the Last Year: Patient declined  Transportation Needs: Patient Declined (02/21/2023)   PRAPARE - Administrator, Civil Service (Medical): Patient declined    Lack of Transportation (Non-Medical): Patient declined  Physical Activity: Inactive (02/21/2023)   Exercise Vital Sign    Days of Exercise per Week: 0 days    Minutes of Exercise per Session: 0 min  Stress: No Stress Concern Present (02/21/2023)   [employer] of Occupational Health - Occupational Stress Questionnaire    Feeling of Stress : Only a little  Social Connections: Socially Isolated (02/21/2023)   Social Connection and Isolation Panel [NHANES]    Frequency of Communication with Friends and Family: More than three times a week    Frequency of Social Gatherings with Friends and Family: Never    Attends Religious Services: Never    Database administrator or Organizations: No    Attends Banker Meetings: Never    Marital Status: Never married    Vitals:   05/12/23 1103  BP: 138/80  Pulse: 80  Resp: 12  SpO2: 91%   Body mass index is 36.31 kg/m.  Physical Exam Vitals and nursing note reviewed.   Constitutional:      General: She is not in acute distress.    Appearance: She is well-developed.  HENT:     Head: Normocephalic and atraumatic.     Mouth/Throat:     Mouth: Mucous membranes are moist.     Dentition: Abnormal dentition.     Pharynx: Uvula midline.  Eyes:     Conjunctiva/sclera: Conjunctivae normal.  Cardiovascular:     Rate and Rhythm: Normal rate and regular rhythm.     Heart sounds: No murmur heard.    Comments: DP pulses palpable. Pulmonary:     Effort: Pulmonary effort is normal. No respiratory distress.     Breath sounds: Normal breath sounds.  Abdominal:     Palpations: Abdomen is soft. There is no mass.     Tenderness: There is generalized abdominal tenderness (with light palpation.). There is no guarding or rebound.  Musculoskeletal:     Right lower leg: No edema.     Left lower leg: No edema.  Lymphadenopathy:     Cervical: No cervical  adenopathy.  Skin:    General: Skin is warm.     Findings: No erythema or rash.  Neurological:     General: No focal deficit present.     Mental Status: She is alert and oriented to person, place, and time.     Cranial Nerves: No cranial nerve deficit.     Gait: Gait normal.  Psychiatric:        Mood and Affect: Mood is anxious.    ASSESSMENT AND PLAN:  Ms. Lardie was seen today for hospital follow up for abdominal pain.   Abdominal pain, generalized Examination today otherwise negative except for mild tenderness with palpation. Recent abdomen/pelvis CT in the ED with contrast did not show an acute problem that could explain cause of pain. Wall thickening of the urinary bladder seen on CT, UA neg for hematuria. Empirically treated with Macrobid for possible cystitis, symptoms have not improved. Had colonoscopy in 11/2021.  Could be related with her chronic medical problems, liver cirrhosis with hepatosplenomegaly. I do not think imaging needs to be repeated. She was clearly instructed about warning  signs. Follow-up in 3 months with PCP, before if needed.  SOBOE (shortness of breath on exertion) This seems to be a chronic problem. Some of her comorbidities could be contributing factors. Deconditioning is also to be considered. Echo done in 11/2022 because of dyspnea: LVEF 60 to 65% and normal diastolic function. Recommend arranging appointment with her pulmonologist if problem is persistent. Instructed about warning signs.  Alcoholic cirrhosis of liver without ascites (HCC) She has not had alcohol intake in about a year. Elevated transaminases (ALT 55 and AST 65) and alk phosphatase (195) on 05/08/2023. She would like to establish care with liver specialist, specifically at [facility], referral placed. Continue avoiding alcohol intake.  Reviewing records after visit, she already follows with GI at [facility], last visit 03/27/2023. Hx of generalized abdominal pain and constipation.  -     Ambulatory referral to Gastroenterology  Pulmonary nodules Assessment & Plan: 05/08/23 abdominal CT showed 5.1 mm subpleural left lower lobe pulmonary nodule. This problem has been followed by her pulmonologist, multiple bilateral pulmonary nodules, last chest CT done on 10/17/2022, annual follow-up was recommended at that time. Smoking cessation encouraged.  Essential hypertension BP today mildly elevated. Continue amlodipine 5 mg daily and propranolol 20 mg 3 times daily. Continue low-salt diet. Follow-up with PCP in 3 months.  -In regard to DM 2: Recommend continuing Lantus, Actos, and Humalog.  Problem is not well-controlled.  Following with endocrinologist. -Some of her medications could be contributing to weight gain.  I spent a total of 43 minutes in both face to face and non face to face activities for this visit on the date of this encounter. During this time history was obtained and documented, examination was performed, prior labs/imaging reviewed, and assessment/plan  discussed.  Return in 3 months (on 08/10/2023), or if symptoms worsen or fail to improve.  I, Rolla Etienne Wierda, acting as a scribe for Delania Ferg [place], MD., have documented all relevant documentation on the behalf of Kimiya Brunelle [place], MD, as directed by  Ronzell Laban [place], MD while in the presence of Terryon Pineiro [place], MD.   I, Benjimen Kelley [place], MD, have reviewed all documentation for this visit. The documentation on 05/12/23 for the exam, diagnosis, procedures, and orders are all accurate and complete.  Bazil Dhanani G. [place], MD  [facility]. [place] office.

## 2023-05-12 NOTE — Assessment & Plan Note (Signed)
05/08/23 abdominal CT showed 5.1 mm subpleural left lower lobe pulmonary nodule. This problem has been followed by her pulmonologist, multiple bilateral pulmonary nodules, last chest CT done on 10/17/2022, annual follow-up was recommended at that time. Smoking cessation encouraged.

## 2023-05-12 NOTE — Patient Instructions (Addendum)
A few things to remember from today's visit:  Alcoholic cirrhosis of liver without ascites (HCC) - Plan: Ambulatory referral to Gastroenterology  SOBOE (shortness of breath on exertion)  Abdominal pain, generalized  Complete antibiotic treatment. Work on smoking cessation. Referral to liver specialist placed. If fever, vomiting,or not able to pass gas you need to seek immediate medical attention.  Arrange appt with pulmonologist.  If you need refills for medications you take chronically, please call your pharmacy. Do not use My Chart to request refills or for acute issues that need immediate attention. If you send a my chart message, it may take a few days to be addressed, specially if I am not in the office.  Please be sure medication list is accurate. If a new problem present, please set up appointment sooner than planned today.

## 2023-05-13 ENCOUNTER — Encounter: Payer: Self-pay | Admitting: Family Medicine

## 2023-05-13 ENCOUNTER — Other Ambulatory Visit: Payer: Self-pay | Admitting: Family Medicine

## 2023-05-13 DIAGNOSIS — R1011 Right upper quadrant pain: Secondary | ICD-10-CM | POA: Diagnosis not present

## 2023-05-13 DIAGNOSIS — R140 Abdominal distension (gaseous): Secondary | ICD-10-CM | POA: Diagnosis not present

## 2023-05-13 DIAGNOSIS — R110 Nausea: Secondary | ICD-10-CM | POA: Diagnosis not present

## 2023-05-15 ENCOUNTER — Telehealth: Payer: Medicaid Other | Admitting: Nurse Practitioner

## 2023-05-15 ENCOUNTER — Encounter: Payer: Self-pay | Admitting: Family Medicine

## 2023-05-15 DIAGNOSIS — K089 Disorder of teeth and supporting structures, unspecified: Secondary | ICD-10-CM

## 2023-05-15 DIAGNOSIS — K047 Periapical abscess without sinus: Secondary | ICD-10-CM

## 2023-05-15 DIAGNOSIS — G8929 Other chronic pain: Secondary | ICD-10-CM | POA: Diagnosis not present

## 2023-05-15 MED ORDER — AZITHROMYCIN 250 MG PO TABS: ORAL_TABLET | ORAL | 0 refills | Status: AC

## 2023-05-15 MED ORDER — NAPROXEN 500 MG PO TABS: 500.0000 mg | ORAL_TABLET | Freq: Two times a day (BID) | ORAL | 0 refills | Status: DC

## 2023-05-15 NOTE — Progress Notes (Signed)
Virtual Visit Consent   [name], you are scheduled for a virtual visit with a [facility] provider today. Just as with appointments in the office, your consent must be obtained to participate. Your consent will be active for this visit and any virtual visit you may have with one of our providers in the next 365 days. If you have a MyChart account, a copy of this consent can be sent to you electronically.  As this is a virtual visit, video technology does not allow for your provider to perform a traditional examination. This may limit your provider's ability to fully assess your condition. If your provider identifies any concerns that need to be evaluated in person or the need to arrange testing (such as labs, EKG, etc.), we will make arrangements to do so. Although advances in technology are sophisticated, we cannot ensure that it will always work on either your end or our end. If the connection with a video visit is poor, the visit may have to be switched to a telephone visit. With either a video or telephone visit, we are not always able to ensure that we have a secure connection.  By engaging in this virtual visit, you consent to the provision of healthcare and authorize for your insurance to be billed (if applicable) for the services provided during this visit. Depending on your insurance coverage, you may receive a charge related to this service.  I need to obtain your verbal consent now. Are you willing to proceed with your visit today? Emily [name] has provided verbal consent on 05/15/2023 for a virtual visit (video or telephone). Viviano Simas, FNP  Date: 05/15/2023 5:56 PM  Virtual Visit via Video Note   I, Viviano Simas, connected with  Emily [name]  ([id number], 03/08/1981) on 05/15/23 at  6:00 PM EST by a video-enabled telemedicine application and verified that I am speaking with the correct person using two identifiers.  Location: Patient: Virtual Visit Location Patient:  Home Provider: Virtual Visit Location Provider: Home Office   I discussed the limitations of evaluation and management by telemedicine and the availability of in person appointments. The patient expressed understanding and agreed to proceed.    History of Present Illness: Emily [name] is a 43 y.o. who identifies as a female who was assigned female at birth, and is being seen today for dental pain.   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   Sebaceous cyst of labia 02/03/2023   Cyst of left ovary 08/11/2022   ASCUS with positive high risk HPV cervical 08/11/2022   [place] 07/30/2022   Hot flashes 07/30/2022   Friable cervix 07/30/2022   Spotting 07/30/2022   Amenorrhea, secondary 07/30/2022   Bartholin cyst 04/30/2022   Irregular periods 04/30/2022   Dental caries 03/11/2022   Tick bite of back 12/20/2020   Chronic bronchitis (HCC) 07/09/2020   Breast tenderness 09/20/2018   Gastroesophageal reflux disease 09/16/2018  Shortness of breath 08/05/2018   Abnormal finding on lung imaging 08/05/2018   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   Vaginal odor 09/30/2017   BV (bacterial vaginosis) 09/30/2017   Swollen lymph nodes 01/06/2017   Vaginal irritation 10/08/2016   RUQ pain 10/08/2016   LLQ pain 10/08/2016   Vaginal discharge 10/08/2016   Menstrual period late 10/08/2016   Cyst of right Bartholin's gland 12/17/2015   Recurrent boils 06/18/2015   Type 2 diabetes mellitus with complication, without  long-term current use of insulin (HCC) 04/18/2015   Ingrowing toenail 04/18/2015   Vitamin D deficiency 04/18/2015   Angular cheilitis 04/18/2015   Dyslipidemia 04/18/2015   Diabetes mellitus with complication in adult patient (HCC) 02/20/2015   Smoker 02/20/2015   Essential hypertension 02/20/2015   Pain in limb 02/20/2015   Myalgia 02/20/2015   Hypothyroidism 02/20/2015   Leg pain, inferior 02/20/2015   Toenail deformity 02/20/2015   Elevated liver enzymes 01/07/2012   Abnormal CT of liver 09/30/2011   Fatty liver 09/30/2011   G E REFLUX 11/30/2007   CHEST PAIN-UNSPECIFIED 11/30/2007   HEADACHE 10/21/2007   DYSPNEA 09/15/2007   UNSPECIFIED TACHYCARDIA 06/01/2007   COUGH 05/24/2007   URINARY INCONTINENCE 05/24/2007   TOBACCO ABUSE 05/20/2007   Pulmonary nodules 05/12/2007   DEPRESSION 04/19/2007   Asthma 04/19/2007    Allergies:  Allergies  Allergen Reactions   Bactrim [Sulfamethoxazole-Trimethoprim] Anaphylaxis and Other (See Comments)    Chest pain, trouble breathing   Ciprofloxacin Palpitations   Clindamycin/Lincomycin     Other reaction(s): Chest Pain   Flagyl [Metronidazole] Shortness Of Breath   Hydrocodone Other (See Comments)   Iodinated Contrast Media Swelling   Ondansetron Shortness Of Breath   Liraglutide Nausea And Vomiting   Doxycycline Nausea And Vomiting   Penicillins    Prednisone Other (See Comments)    Makes her angry and irritable   Medications:  Current Outpatient Medications:    ACCU-CHEK AVIVA PLUS test strip, 1 each by Other route 3 (three) times daily., Disp: , Rfl:    ACCU-CHEK FASTCLIX LANCETS MISC, USE AS DIRECTED, Disp: 102 each, Rfl: 3   albuterol (VENTOLIN HFA) 108 (90 Base) MCG/ACT inhaler, Inhale 2 puffs into the lungs every 6 (six) hours as needed for wheezing or shortness of breath., Disp: 18 g, Rfl: 1   amitriptyline (ELAVIL) 25 MG tablet, Take 50 mg by mouth at bedtime., Disp: , Rfl:    amLODipine (NORVASC) 5 MG tablet, TAKE 1  TABLET (5 MG TOTAL) BY MOUTH DAILY., Disp: 90 tablet, Rfl: 3   Blood Glucose Monitoring Suppl (ACCU-CHEK AVIVA PLUS) w/Device KIT, USE AS DIRECTED, Disp: 1 kit, Rfl: 0   Blood Glucose Monitoring Suppl DEVI, 1 each by Does not apply route in the morning, at noon, and at bedtime. May substitute to any manufacturer covered by patient's insurance., Disp: 1 each, Rfl: 0   clonazePAM (KLONOPIN) 0.5 MG disintegrating tablet, Take 0.5 mg by mouth daily as needed., Disp: , Rfl:    Continuous Glucose Receiver (DEXCOM G7 RECEIVER) DEVI, 1 Device by Does not apply route continuous., Disp: 1 each, Rfl: 0   Continuous Glucose Sensor (DEXCOM G7 SENSOR) MISC, 1 Device by Does not apply route continuous., Disp: 9 each, Rfl: 0   fluticasone-salmeterol (ADVAIR) 250-50 MCG/ACT AEPB, Inhale 1 puff into the lungs every 12 (twelve) hours. Rinse after use., Disp: 60 each, Rfl: 5   gabapentin (NEURONTIN) 100 MG capsule, Take 1 capsule (100 mg total) by mouth 3 (three) times daily., Disp: 90  capsule, Rfl: 3   Glucose Blood (BLOOD GLUCOSE TEST STRIPS) STRP, 1 each by In Vitro route in the morning, at noon, and at bedtime. May substitute to any manufacturer covered by patient's insurance., Disp: 100 each, Rfl: 3   INCRUSE ELLIPTA 62.5 MCG/ACT AEPB, daily as needed., Disp: , Rfl:    insulin glargine (LANTUS SOLOSTAR) 100 UNIT/ML Solostar Pen, Inject 15 Units into the skin daily. Plus sliding scale, max dose 60 units per day, Disp: 15 mL, Rfl: 4   insulin lispro (HUMALOG KWIKPEN) 100 UNIT/ML KwikPen, INJECT TEN UNITS INTO THE SKIN 3 (THREE) TIMES DAILY WITH MEALS., Disp: 15 mL, Rfl: 3   ipratropium-albuterol (DUONEB) 0.5-2.5 (3) MG/3ML SOLN, Take 3 mLs by nebulization in the morning, at noon, and at bedtime., Disp: 270 mL, Rfl: 11   lamoTRIgine (LAMICTAL) 150 MG tablet, Take 150 mg by mouth daily., Disp: , Rfl:    levothyroxine (SYNTHROID) 75 MCG tablet, Take 1 tablet (75 mcg total) by mouth daily before breakfast., Disp: 90  tablet, Rfl: 0   montelukast (SINGULAIR) 10 MG tablet, Take 1 tablet (10 mg total) by mouth at bedtime., Disp: 90 tablet, Rfl: 3   Nebulizers (COMPRESSOR/NEBULIZER) MISC, Use as directed, Disp: 1 each, Rfl: 0   nitroGLYCERIN (NITROSTAT) 0.4 MG SL tablet, Place 1 tablet (0.4 mg total) under the tongue every 5 (five) minutes as needed for chest pain., Disp: 25 tablet, Rfl: 3   omeprazole (PRILOSEC) 40 MG capsule, Take 40 mg by mouth every morning., Disp: , Rfl:    OVER THE COUNTER MEDICATION, Take by mouth as needed. Maalox lidocain 2% takes 15-30 mls, Disp: , Rfl:    PAXIL 20 MG tablet, Take 20 mg by mouth daily., Disp: , Rfl:    pioglitazone (ACTOS) 15 MG tablet, Take 1 tablet (15 mg total) by mouth daily., Disp: 30 tablet, Rfl: 0   predniSONE (DELTASONE) 10 MG tablet, 4 x 1 day, 3 x 1 day, 2 x 1 day, 1 x 1 day, 1/2 x 1 day then stop, Disp: 11 tablet, Rfl: 0   promethazine (PHENERGAN) 12.5 MG tablet, Take 12.5 mg by mouth every 8 (eight) hours as needed., Disp: , Rfl:    propranolol (INDERAL) 20 MG tablet, Take 1 tablet (20 mg total) by mouth 3 (three) times daily., Disp: 270 tablet, Rfl: 3   Respiratory Therapy Supplies (NEBULIZER/TUBING/MOUTHPIECE) KIT, Use as directed, Disp: 10 kit, Rfl: 11   traZODone (DESYREL) 50 MG tablet, Take 50-100 mg by mouth at bedtime., Disp: , Rfl:    ziprasidone (GEODON) 60 MG capsule, Take 60 mg by mouth daily., Disp: , Rfl:   Observations/Objective: Patient is well-developed, well-nourished in no acute distress.  Resting comfortably  at home.  Head is normocephalic, atraumatic.  No labored breathing.  Speech is clear and coherent with logical content.  Patient is alert and oriented at baseline.  Lower front teeth are all broken off/black aside from one tooth on bottom left  Gums appear inflamed   Assessment and Plan:  1. Chronic dental pain (Primary) Advised to use only as needed and to take with food to prevent GI upset   - naproxen (NAPROSYN) 500 MG  tablet; Take 1 tablet (500 mg total) by mouth 2 (two) times daily with a meal.  Dispense: 30 tablet; Refill: 0  2. Dental infection Take with food and follow up with Dentist Monday  Seek follow up over the weekend in person for new or worsening symptoms   - azithromycin (ZITHROMAX) 250  MG tablet; Take 2 tablets on day 1, then 1 tablet daily on days 2 through 5  Dispense: 6 tablet; Refill: 0     Follow Up Instructions: I discussed the assessment and treatment plan with the patient. The patient was provided an opportunity to ask questions and all were answered. The patient agreed with the plan and demonstrated an understanding of the instructions.  A copy of instructions were sent to the patient via MyChart unless otherwise noted below.     The patient was advised to call back or seek an in-person evaluation if the symptoms worsen or if the condition fails to improve as anticipated.    Viviano Simas, FNP

## 2023-05-16 ENCOUNTER — Encounter: Payer: Self-pay | Admitting: "Endocrinology

## 2023-05-18 ENCOUNTER — Encounter: Payer: Self-pay | Admitting: "Endocrinology

## 2023-05-19 ENCOUNTER — Other Ambulatory Visit: Payer: Self-pay

## 2023-05-19 DIAGNOSIS — E1165 Type 2 diabetes mellitus with hyperglycemia: Secondary | ICD-10-CM

## 2023-05-20 ENCOUNTER — Other Ambulatory Visit (HOSPITAL_COMMUNITY)
Admission: RE | Admit: 2023-05-20 | Discharge: 2023-05-20 | Disposition: A | Discharge status: Home / Self Care | Payer: Medicaid Other | Source: Ambulatory Visit | Attending: "Endocrinology | Admitting: "Endocrinology

## 2023-05-20 DIAGNOSIS — E1165 Type 2 diabetes mellitus with hyperglycemia: Secondary | ICD-10-CM | POA: Insufficient documentation

## 2023-05-20 LAB — HEMOGLOBIN A1C
Hgb A1c MFr Bld: 10.9 % — ABNORMAL HIGH (ref 4.8–5.6)
Mean Plasma Glucose: 266.13 mg/dL

## 2023-05-20 NOTE — Telephone Encounter (Signed)
Spoke with patient and she will get directions at the hospital

## 2023-05-22 ENCOUNTER — Telehealth (INDEPENDENT_AMBULATORY_CARE_PROVIDER_SITE_OTHER): Payer: Medicaid Other | Admitting: "Endocrinology

## 2023-05-22 ENCOUNTER — Encounter: Payer: Self-pay | Admitting: "Endocrinology

## 2023-05-22 DIAGNOSIS — Z794 Long term (current) use of insulin: Secondary | ICD-10-CM

## 2023-05-22 DIAGNOSIS — E1165 Type 2 diabetes mellitus with hyperglycemia: Secondary | ICD-10-CM

## 2023-05-22 DIAGNOSIS — E782 Mixed hyperlipidemia: Secondary | ICD-10-CM

## 2023-05-22 MED ORDER — SYNJARDY XR 12.5-1000 MG PO TB24: 2.0000 | ORAL_TABLET | Freq: Every day | ORAL | 5 refills | Status: DC

## 2023-05-22 NOTE — Progress Notes (Signed)
Outpatient Endocrinology Note Altamese Mays Chapel, MD  05/22/23   Emily [name] 12-Jul-1980 [id number]  Referring Provider: Alveria Apley, NP Primary Care Provider: Alveria Apley, NP Reason for consultation: Subjective   Assessment & Plan  Diagnoses and all orders for this visit:  Uncontrolled type 2 diabetes mellitus with hyperglycemia (HCC)  Long-term insulin use (HCC)  Mixed hypercholesterolemia and hypertriglyceridemia  Other orders -     Empagliflozin-metFORMIN HCl ER (SYNJARDY XR) 12.08-998 MG TB24; Take 2 tablets by mouth daily.    Diabetes Type II complicated by neuropathy Lab Results  Component Value Date   GFR 108.36 01/22/2023   Hba1c goal less than 7, current Hba1c is  Lab Results  Component Value Date   HGBA1C 10.9 (H) 05/20/2023   Will recommend the following: Start synjardy XR 08/998 once a week, increase to bid after a week if well tolerated  Patient quit all medications and Dexcom and did not keep DM appointment due to "weight gain" Tried Trulicity-reports head ache; tried victoza/mounjaro/ozempic-caused nausea/vomiting Reports trying even slow release metformin but d/c due to diarrhea  Other options include rybelsus/januvia (ned to watch Tg) Patient is not open to the idea of insulin despite recommendations nor bariatric surgery consultation  Self stopped: Lantus 22-25 units once every morning Humalog 10 units 15 min before each meal   Pioglitazone 15 mg qd  No known contraindications/side effects to any of above medications No history of MEN syndrome/medullary thyroid cancer/pancreatitis or pancreatic cancer in self or family  -Last LD and Tg are as follows: Lab Results  Component Value Date   LDLCALC 102 (H) 07/16/2021    Lab Results  Component Value Date   TRIG (H) 01/16/2023    411.0 Triglyceride is over 400; calculations on Lipids are invalid.   -not on statin (has cirrhosis per pt, atorvastatin 40 mg was stopped  by provider) -Follow low fat diet and exercise   -Blood pressure goal <140/90 - Microalbumin/creatinine goal is < 30 -Last MA/Cr is as follows: Lab Results  Component Value Date   MICROALBUR <0.7 01/16/2023   -not on ACE/ARB  -diet changes including salt restriction -limit eating outside -counseled BP targets per standards of diabetes care -uncontrolled blood pressure can lead to retinopathy, nephropathy and cardiovascular and atherosclerotic heart disease  Reviewed and counseled on: -A1C target -Blood sugar targets -Complications of uncontrolled diabetes  -Checking blood sugar before meals and bedtime and bring log next visit -All medications with mechanism of action and side effects -Hypoglycemia management: rule of 15's, Glucagon Emergency Kit and medical alert ID -low-carb low-fat plate-method diet -At least 20 minutes of physical activity per day -Annual dilated retinal eye exam and foot exam -compliance and follow up needs -follow up as scheduled or earlier if problem gets worse  Call if blood sugar is less than 70 or consistently above 250    Take a 15 gm snack of carbohydrate at bedtime before you go to sleep if your blood sugar is less than 100.    If you are going to fast after midnight for a test or procedure, ask your physician for instructions on how to reduce/decrease your insulin dose.    Call if blood sugar is less than 70 or consistently above 250  -Treating a low sugar by rule of 15  (15 gms of sugar every 15 min until sugar is more than 70) If you feel your sugar is low, test your sugar to be sure If your sugar is low (  less than 70), then take 15 grams of a fast acting Carbohydrate (3-4 glucose tablets or glucose gel or 4 ounces of juice or regular soda) Recheck your sugar 15 min after treating low to make sure it is more than 70 If sugar is still less than 70, treat again with 15 grams of carbohydrate          Don't drive the hour of hypoglycemia  If  unconscious/unable to eat or drink by mouth, use glucagon injection or nasal spray baqsimi and call 911. Can repeat again in 15 min if still unconscious.  Return in about 4 weeks (around 06/19/2023).   I have reviewed current medications, nurse's notes, allergies, vital signs, past medical and surgical history, family medical history, and social history for this encounter. Counseled patient on symptoms, examination findings, lab findings, imaging results, treatment decisions and monitoring and prognosis. The patient understood the recommendations and agrees with the treatment plan. All questions regarding treatment plan were fully answered.  Altamese Novato, MD  05/22/23  History of Present Illness Emily [name] is a 43 y.o. year old female who presents for follow up of Type II diabetes mellitus.  Emily [name] was first diagnosed around 2012.   Diabetes education -  Home diabetes regimen:  Self stopped  Lantus 16 units once every morning Humalog 10 units 15 min before each meal    COMPLICATIONS -  MI/Stroke -  retinopathy -  neuropathy -  nephropathy  BLOOD SUGAR DATA Not wearing DexCom Checks twice a day  Doesn't have meter, reports its at parent's house   Recalls BG in 200-300s  Physical Exam  There were no vitals taken for this visit.   Constitutional: well developed, well nourished Head: normocephalic, atraumatic Eyes: sclera anicteric, no redness Neck: supple Lungs: normal respiratory effort Neurology: alert and oriented Skin: dry, no appreciable rashes Musculoskeletal: no appreciable defects Psychiatric: normal mood and affect Diabetic Foot Exam - Simple   No data filed      Current Medications Patient's Medications  New Prescriptions   EMPAGLIFLOZIN-METFORMIN HCL ER (SYNJARDY XR) 12.08-998 MG TB24    Take 2 tablets by mouth daily.  Previous Medications   ACCU-CHEK AVIVA PLUS TEST STRIP    1 each by Other route 3 (three) times daily.   ACCU-CHEK  FASTCLIX LANCETS MISC    USE AS DIRECTED   ALBUTEROL (VENTOLIN HFA) 108 (90 BASE) MCG/ACT INHALER    Inhale 2 puffs into the lungs every 6 (six) hours as needed for wheezing or shortness of breath.   AMITRIPTYLINE (ELAVIL) 25 MG TABLET    Take 50 mg by mouth at bedtime.   AMLODIPINE (NORVASC) 5 MG TABLET    TAKE 1 TABLET (5 MG TOTAL) BY MOUTH DAILY.   BLOOD GLUCOSE MONITORING SUPPL (ACCU-CHEK AVIVA PLUS) W/DEVICE KIT    USE AS DIRECTED   BLOOD GLUCOSE MONITORING SUPPL DEVI    1 each by Does not apply route in the morning, at noon, and at bedtime. May substitute to any manufacturer covered by patient's insurance.   CLONAZEPAM (KLONOPIN) 0.5 MG DISINTEGRATING TABLET    Take 0.5 mg by mouth daily as needed.   CONTINUOUS GLUCOSE RECEIVER (DEXCOM G7 RECEIVER) DEVI    1 Device by Does not apply route continuous.   CONTINUOUS GLUCOSE SENSOR (DEXCOM G7 SENSOR) MISC    1 Device by Does not apply route continuous.   FLUTICASONE-SALMETEROL (ADVAIR) 250-50 MCG/ACT AEPB    Inhale 1 puff into the lungs every 12 (  twelve) hours. Rinse after use.   GABAPENTIN (NEURONTIN) 100 MG CAPSULE    Take 1 capsule (100 mg total) by mouth 3 (three) times daily.   GLUCOSE BLOOD (BLOOD GLUCOSE TEST STRIPS) STRP    1 each by In Vitro route in the morning, at noon, and at bedtime. May substitute to any manufacturer covered by patient's insurance.   INCRUSE ELLIPTA 62.5 MCG/ACT AEPB    daily as needed.   INSULIN GLARGINE (LANTUS SOLOSTAR) 100 UNIT/ML SOLOSTAR PEN    Inject 15 Units into the skin daily. Plus sliding scale, max dose 60 units per day   INSULIN LISPRO (HUMALOG KWIKPEN) 100 UNIT/ML KWIKPEN    INJECT TEN UNITS INTO THE SKIN 3 (THREE) TIMES DAILY WITH MEALS.   IPRATROPIUM-ALBUTEROL (DUONEB) 0.5-2.5 (3) MG/3ML SOLN    Take 3 mLs by nebulization in the morning, at noon, and at bedtime.   LAMOTRIGINE (LAMICTAL) 150 MG TABLET    Take 150 mg by mouth daily.   LEVOTHYROXINE (SYNTHROID) 75 MCG TABLET    Take 1 tablet (75 mcg  total) by mouth daily before breakfast.   MONTELUKAST (SINGULAIR) 10 MG TABLET    Take 1 tablet (10 mg total) by mouth at bedtime.   NAPROXEN (NAPROSYN) 500 MG TABLET    Take 1 tablet (500 mg total) by mouth 2 (two) times daily with a meal.   NEBULIZERS (COMPRESSOR/NEBULIZER) MISC    Use as directed   NITROGLYCERIN (NITROSTAT) 0.4 MG SL TABLET    Place 1 tablet (0.4 mg total) under the tongue every 5 (five) minutes as needed for chest pain.   OMEPRAZOLE (PRILOSEC) 40 MG CAPSULE    Take 40 mg by mouth every morning.   OVER THE COUNTER MEDICATION    Take by mouth as needed. Maalox lidocain 2% takes 15-30 mls   PAXIL 20 MG TABLET    Take 20 mg by mouth daily.   PIOGLITAZONE (ACTOS) 15 MG TABLET    Take 1 tablet (15 mg total) by mouth daily.   PREDNISONE (DELTASONE) 10 MG TABLET    4 x 1 day, 3 x 1 day, 2 x 1 day, 1 x 1 day, 1/2 x 1 day then stop   PROMETHAZINE (PHENERGAN) 12.5 MG TABLET    Take 12.5 mg by mouth every 8 (eight) hours as needed.   PROPRANOLOL (INDERAL) 20 MG TABLET    Take 1 tablet (20 mg total) by mouth 3 (three) times daily.   RESPIRATORY THERAPY SUPPLIES (NEBULIZER/TUBING/MOUTHPIECE) KIT    Use as directed   TRAZODONE (DESYREL) 50 MG TABLET    Take 50-100 mg by mouth at bedtime.   ZIPRASIDONE (GEODON) 60 MG CAPSULE    Take 60 mg by mouth daily.  Modified Medications   No medications on file  Discontinued Medications   No medications on file    Allergies Allergies  Allergen Reactions   Bactrim [Sulfamethoxazole-Trimethoprim] Anaphylaxis and Other (See Comments)    Chest pain, trouble breathing   Ciprofloxacin Palpitations   Clindamycin/Lincomycin     Other reaction(s): Chest Pain   Flagyl [Metronidazole] Shortness Of Breath   Hydrocodone Other (See Comments)   Iodinated Contrast Media Swelling   Ondansetron Shortness Of Breath   Liraglutide Nausea And Vomiting   Doxycycline Nausea And Vomiting   Penicillins    Prednisone Other (See Comments)    Makes her angry and  irritable    Past Medical History Past Medical History:  Diagnosis Date   Abnormal CT of liver  Anxiety    Asthma    Asthma    Bipolar 1 disorder (HCC)    Bipolar disorder (HCC)    Chronic pain    Cirrhosis (HCC)    Depression    Diabetes mellitus without complication (HCC)    Fatty liver    Gallstones    GERD (gastroesophageal reflux disease)    Headache(784.0)    Hyperlipidemia    Hypertension    Hypothyroidism    Pulmonary nodule    Tachycardia    Thyroid disease    Vitamin D deficiency     Past Surgical History Past Surgical History:  Procedure Laterality Date   DILATION AND CURETTAGE OF UTERUS     1 yr ago   UPPER GI ENDOSCOPY  08/28/2022    Family History family history includes Asthma in her mother; Diabetes in her maternal grandmother; Hypertension in her maternal grandmother; Valvular heart disease in her father.  Social History Social History   Socioeconomic History   Marital status: Single    Spouse name: Not on file   Number of children: 0   Years of education: Not on file   Highest education level: 9th grade  Occupational History   Not on file  Tobacco Use   Smoking status: Every Day    Current packs/day: 1.00    Average packs/day: 1 pack/day for 20.0 years (20.0 ttl pk-yrs)    Types: Cigarettes   Smokeless tobacco: Never   Tobacco comments:    Smoking a pack per day.  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   Drug use: No   Sexual activity: Not Currently    Partners: Male    Birth control/protection: Abstinence  Other Topics Concern   Not on file  Social History Narrative   Not on file   Social Drivers of Health   Financial Resource Strain: Low Risk  (02/21/2023)   Overall Financial Resource Strain (CARDIA)    Difficulty of Paying Living Expenses: Not very hard  Food Insecurity: Patient Declined (02/21/2023)   Hunger Vital Sign     Worried About Running Out of Food in the Last Year: Patient declined    Ran Out of Food in the Last Year: Patient declined  Transportation Needs: Patient Declined (02/21/2023)   PRAPARE - Administrator, Civil Service (Medical): Patient declined    Lack of Transportation (Non-Medical): Patient declined  Physical Activity: Inactive (02/21/2023)   Exercise Vital Sign    Days of Exercise per Week: 0 days    Minutes of Exercise per Session: 0 min  Stress: No Stress Concern Present (02/21/2023)   [employer] of Occupational Health - Occupational Stress Questionnaire    Feeling of Stress : Only a little  Social Connections: Socially Isolated (02/21/2023)   Social Connection and Isolation Panel [NHANES]    Frequency of Communication with Friends and Family: More than three times a week    Frequency of Social Gatherings with Friends and Family: Never    Attends Religious Services: Never    Database administrator or Organizations: No    Attends Banker Meetings: Never    Marital Status: Never married  Intimate Partner Violence: Not At Risk (05/08/2023)   Received from [facility]   HITS    Over the last 12 months how often did your partner physically hurt you?: Never  Over the last 12 months how often did your partner insult you or talk down to you?: Never    Over the last 12 months how often did your partner threaten you with physical harm?: Never    Over the last 12 months how often did your partner scream or curse at you?: Never    Lab Results  Component Value Date   HGBA1C 10.9 (H) 05/20/2023   HGBA1C 11.0 (H) 01/16/2023   HGBA1C 9.7 (H) 09/24/2022   Lab Results  Component Value Date   CHOL 223 (H) 01/16/2023   Lab Results  Component Value Date   HDL 34.80 (L) 01/16/2023   Lab Results  Component Value Date   LDLCALC 102 (H) 07/16/2021   Lab Results  Component Value Date   TRIG (H) 01/16/2023    411.0 Triglyceride is over 400; calculations on  Lipids are invalid.   Lab Results  Component Value Date   CHOLHDL 6 01/16/2023   Lab Results  Component Value Date   CREATININE 0.68 01/22/2023   Lab Results  Component Value Date   GFR 108.36 01/22/2023   Lab Results  Component Value Date   MICROALBUR <0.7 01/16/2023      Component Value Date/Time   NA 129 (L) 01/22/2023 1335   NA 135 05/02/2022 1617   K 4.3 01/22/2023 1335   CL 96 01/22/2023 1335   CO2 26 01/22/2023 1335   GLUCOSE 319 (H) 01/22/2023 1335   BUN 5 (L) 01/22/2023 1335   BUN 5 (L) 05/02/2022 1617   CREATININE 0.68 01/22/2023 1335   CREATININE 0.60 02/20/2015 0001   CALCIUM 8.8 01/22/2023 1335   PROT 7.5 01/16/2023 1041   PROT 7.0 07/16/2021 1412   ALBUMIN 3.9 01/16/2023 1041   ALBUMIN 3.6 (L) 07/16/2021 1412   AST 57 (H) 01/16/2023 1041   ALT 42 (H) 01/16/2023 1041   ALKPHOS 149 (H) 01/16/2023 1041   BILITOT 1.1 01/16/2023 1041   BILITOT 0.7 07/16/2021 1412   GFRNONAA >60 05/23/2022 0301   GFRAA 129 06/01/2018 1114      Latest Ref Rng & Units 01/22/2023    1:35 PM 01/16/2023   10:41 AM 12/01/2022    2:26 PM  BMP  Glucose 70 - 99 mg/dL 086  578  469   BUN 6 - 23 mg/dL 5  5  6    Creatinine 0.40 - 1.20 mg/dL 6.29  5.28  4.13   Sodium 135 - 145 mEq/L 129  133  126   Potassium 3.5 - 5.1 mEq/L 4.3  4.0  3.9   Chloride 96 - 112 mEq/L 96  97  94   CO2 19 - 32 mEq/L 26  28  25    Calcium 8.4 - 10.5 mg/dL 8.8  9.0  8.9        Component Value Date/Time   WBC 11.8 (H) 10/02/2022 1513   RBC 3.67 (L) 10/02/2022 1513   HGB 13.9 10/02/2022 1513   HGB 11.9 05/02/2022 1617   HCT 42.3 10/02/2022 1513   HCT 33.9 (L) 05/02/2022 1617   PLT 234.0 10/02/2022 1513   PLT 221 05/02/2022 1617   MCV 115.2 Repeated and verified X2. (H) 10/02/2022 1513   MCV 115 (H) 05/02/2022 1617   MCH 41.7 (H) 05/23/2022 0301   MCHC 32.9 10/02/2022 1513   RDW 14.5 10/02/2022 1513   RDW 11.9 05/02/2022 1617   LYMPHSABS 4.7 (H) 10/02/2022 1513   MONOABS 0.4 10/02/2022 1513    EOSABS 0.3 10/02/2022 1513  BASOSABS 0.1 10/02/2022 1513     Parts of this note may have been dictated using voice recognition software. There may be variances in spelling and vocabulary which are unintentional. Not all errors are proofread. Please notify the Thereasa Parkin if any discrepancies are noted or if the meaning of any statement is not clear.

## 2023-05-22 NOTE — Patient Instructions (Signed)

## 2023-05-26 ENCOUNTER — Encounter: Payer: Self-pay | Admitting: Family Medicine

## 2023-05-27 DIAGNOSIS — F3170 Bipolar disorder, currently in remission, most recent episode unspecified: Secondary | ICD-10-CM | POA: Diagnosis not present

## 2023-05-27 DIAGNOSIS — F4312 Post-traumatic stress disorder, chronic: Secondary | ICD-10-CM | POA: Diagnosis not present

## 2023-05-27 DIAGNOSIS — F411 Generalized anxiety disorder: Secondary | ICD-10-CM | POA: Diagnosis not present

## 2023-05-27 DIAGNOSIS — F410 Panic disorder [episodic paroxysmal anxiety] without agoraphobia: Secondary | ICD-10-CM | POA: Diagnosis not present

## 2023-05-28 ENCOUNTER — Ambulatory Visit: Payer: Medicaid Other | Admitting: Family Medicine

## 2023-06-01 ENCOUNTER — Ambulatory Visit: Payer: Medicaid Other | Admitting: Family Medicine

## 2023-06-02 ENCOUNTER — Encounter: Payer: Self-pay | Admitting: Adult Health

## 2023-06-02 ENCOUNTER — Ambulatory Visit: Payer: Medicaid Other | Admitting: Adult Health

## 2023-06-02 VITALS — BP 125/83 | HR 77 | Ht 63.5 in | Wt 201.5 lb

## 2023-06-02 DIAGNOSIS — I10 Essential (primary) hypertension: Secondary | ICD-10-CM | POA: Diagnosis not present

## 2023-06-02 DIAGNOSIS — R102 Pelvic and perineal pain unspecified side: Secondary | ICD-10-CM | POA: Insufficient documentation

## 2023-06-02 DIAGNOSIS — N926 Irregular menstruation, unspecified: Secondary | ICD-10-CM

## 2023-06-02 DIAGNOSIS — Z3202 Encounter for pregnancy test, result negative: Secondary | ICD-10-CM | POA: Diagnosis not present

## 2023-06-02 DIAGNOSIS — R350 Frequency of micturition: Secondary | ICD-10-CM | POA: Diagnosis not present

## 2023-06-02 DIAGNOSIS — N83202 Unspecified ovarian cyst, left side: Secondary | ICD-10-CM | POA: Diagnosis not present

## 2023-06-02 LAB — POCT URINALYSIS DIPSTICK
Glucose, UA: POSITIVE — AB
Ketones, UA: NEGATIVE
Leukocytes, UA: NEGATIVE
Nitrite, UA: NEGATIVE
Protein, UA: NEGATIVE

## 2023-06-02 LAB — POCT URINE PREGNANCY: Preg Test, Ur: NEGATIVE

## 2023-06-02 NOTE — Progress Notes (Signed)
  Subjective:     Patient ID: Emily [name], female   DOB: [birth year], 43 y.o.   MRN: [id number]  HPI Emily [name] is a 43 year old white female, single, G1P0010 in complaining of irregular bleeding and cramps. And has urinary frequency.  Has missed several periods then has had bleeding on and off since 05/19/23.     Component Value Date/Time   DIAGPAP (A) 07/30/2022 1120    - Atypical squamous cells of undetermined significance (ASC-US)   HPVHIGH Positive (A) 07/30/2022 1120   ADEQPAP  07/30/2022 1120    Satisfactory for evaluation; transformation zone component PRESENT.    PCP is Cherrie Gauze NP  Review of Systems + irregular bleeding and cramps. + urinary frequency.    Has missed several periods then has had bleeding on and off since 05/19/23. Not having sex Reviewed past medical,surgical, social and family history. Reviewed medications and allergies.  Objective:   Physical Exam BP 125/83 (BP Location: Left Arm, Patient Position: Sitting, Cuff Size: Normal)   Pulse 77   Ht 5' 3.5" (1.613 m)   Wt 201 lb 8 oz (91.4 kg)   LMP 05/19/2023 (Approximate)   BMI 35.13 kg/m  UPT is negative. Urine dipstick 3+blood and 4+glucose Skin warm and dry.Pelvic: external genitalia is normal in appearance no lesions, vagina: +blood,urethra has no lesions or masses noted, cervix:smooth, uterus: normal size, shape and contour, mildly tender, no masses felt, adnexa: no masses or tenderness noted. Bladder is non tender and no masses felt.  Fall risk is low  Upstream - 06/02/23 1604       Pregnancy Intention Screening   Does the patient want to become pregnant in the next year? No    Does the patient's partner want to become pregnant in the next year? No    Would the patient like to discuss contraceptive options today? No      Contraception Wrap Up   Current Method Abstinence    End Method Abstinence    Contraception Counseling Provided No               Examination chaperoned by Malachy Mood LPN  Assessment:     1. Pregnancy examination or test, negative result - POCT urine pregnancy  2. Urinary frequency UA C&S sent to rule out UTI - POCT Urinalysis Dipstick - Urine Culture - Urinalysis, Routine w reflex microscopic  3. Pelvic cramping +cramping with get pelvic IS  - US PELVIC COMPLETE WITH TRANSVAGINAL; Future  4. Irregular bleeding (Primary) Bleeding on and off since 05/19/23  Will get pelvic US to assess uterus and ovaries in about 2 weeks in office  - US PELVIC COMPLETE WITH TRANSVAGINAL; Future  5. Cyst of left ovary Hx cyst  Will get [place] to assess  - US PELVIC COMPLETE WITH TRANSVAGINAL; Future  6. Essential hypertension Take BP meds and follow up with PCP  7. Missed periods Had missed several periods    Plan:    Return in 2 weeks for pelvic US  Will talk when [place] results back

## 2023-06-03 DIAGNOSIS — E1169 Type 2 diabetes mellitus with other specified complication: Secondary | ICD-10-CM | POA: Diagnosis not present

## 2023-06-03 DIAGNOSIS — E785 Hyperlipidemia, unspecified: Secondary | ICD-10-CM | POA: Diagnosis not present

## 2023-06-03 DIAGNOSIS — Z6835 Body mass index (BMI) 35.0-35.9, adult: Secondary | ICD-10-CM | POA: Diagnosis not present

## 2023-06-03 DIAGNOSIS — E66812 Obesity, class 2: Secondary | ICD-10-CM | POA: Diagnosis not present

## 2023-06-03 DIAGNOSIS — F319 Bipolar disorder, unspecified: Secondary | ICD-10-CM | POA: Diagnosis not present

## 2023-06-03 DIAGNOSIS — I10 Essential (primary) hypertension: Secondary | ICD-10-CM | POA: Diagnosis not present

## 2023-06-03 DIAGNOSIS — Z713 Dietary counseling and surveillance: Secondary | ICD-10-CM | POA: Diagnosis not present

## 2023-06-03 DIAGNOSIS — E1165 Type 2 diabetes mellitus with hyperglycemia: Secondary | ICD-10-CM | POA: Diagnosis not present

## 2023-06-03 DIAGNOSIS — Z7182 Exercise counseling: Secondary | ICD-10-CM | POA: Diagnosis not present

## 2023-06-04 ENCOUNTER — Telehealth: Payer: Self-pay | Admitting: Adult Health

## 2023-06-04 ENCOUNTER — Ambulatory Visit: Payer: Self-pay | Admitting: Family Medicine

## 2023-06-04 DIAGNOSIS — R102 Pelvic and perineal pain: Secondary | ICD-10-CM

## 2023-06-04 DIAGNOSIS — N926 Irregular menstruation, unspecified: Secondary | ICD-10-CM

## 2023-06-04 NOTE — Telephone Encounter (Signed)
Left message to call me back, first available [place] appt at [facility] is Monday, it I placed a stat order they might could do it Friday. Call me back

## 2023-06-04 NOTE — Telephone Encounter (Signed)
Copied from CRM [id number]. Topic: Clinical - Red Word Triage >> Jun 04, 2023  3:14 PM Irine Seal wrote: Red Word that prompted transfer to Nurse Triage: patient is in pain, with cramps and mild bleeding since 05/19/23,  she spoke with her GYNO and they can't get her in for a while  patient is wanting an ultrasound and does not want to go to the ER due to the flu   Chief Complaint: Vaginal bleeding/cramping/pain Symptoms: bleeding/pain/cramping Frequency: since January 28th Pertinent Negatives: Patient denies fevers, vaginal discharge, chest pain, difficulty breathing, diarrhea, vomiting Disposition: [x] ED /[] Urgent Care (no appt availability in office) / [] Appointment(In office/virtual)/ []  [facility]/ [] Home Care/ [] Refused Recommended Disposition /[] [facility]/ []  Follow-up with PCP Additional Notes: Patient called and advised that she has been having vaginal bleeding and cramping/pain since May 19, 2023. Patient went to the GYN and was assessed.  They advised that they wanted her to have an ultrasound done to check her ovaries.  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 Patient denies fevers, vaginal discharge, chest pain, difficulty breathing, diarrhea, vomiting. Initially, it was recommended for patient to be seen within 4 hours, however, no appointments were available at the PCP in that time frame.  After talking with patient a little longer, it was apparent that the patient was in pain and she states that she feels tired but not weak.  She said that she had severe pain and bleeding and now it is moderate/bearable but patient sounds unwell on the phone.  It is advised to the patient that at this time with an unknown amount of blood loss, feeling tired, history of having a cyst on her left ovary, increased pain on both sides, continuous cramping since Jan 28th 2025 that the patient goes to the emergency room at this time for further  evaluation by a provider at the E.R.  Patient verbalized understanding and states that she is going to go to the E.R. to get checked out because this is not normal and she is in too much pain to wait for an ultrasound appointment which her Gyn said would be a while before she could get that done.    Reason for Disposition  Patient sounds very sick or weak to the triager  Answer Assessment - Initial Assessment Questions 1. AMOUNT: "Describe the bleeding that you are having."    - SPOTTING: spotting, or pinkish / brownish mucous discharge; does not fill panty liner or pad    - MILD:  less than 1 pad / hour; less than patient's usual menstrual bleeding   - MODERATE: 1-2 pads / hour; 1 menstrual cup every 6 hours; small-medium blood clots (e.g., pea, grape, small coin)   - SEVERE: soaking 2 or more pads/hour for 2 or more hours; 1 menstrual cup every 2 hours; bleeding not contained by pads or continuous red blood from vagina; large blood clots (e.g., golf ball, large coin)      Moderate now but it was severe 2. ONSET: "When did the bleeding begin?" "Is it continuing now?"     Starting January 28th 3. MENSTRUAL PERIOD: "When was the last normal menstrual period?" "How is this different than your period?"     Lasting longer and the pain/cramping is much worse 4. REGULARITY: "How regular are your periods?"     regular 5. ABDOMEN PAIN: "Do you have any pain?" "How bad is the pain?"  (e.g., Scale  1-10; mild, moderate, or severe)   - MILD (1-3): doesn't interfere with normal activities, abdomen soft and not tender to touch    - MODERATE (4-7): interferes with normal activities or awakens from sleep, abdomen tender to touch    - SEVERE (8-10): excruciating pain, doubled over, unable to do any normal activities      Normal cramping but sides hurt 6. PREGNANCY: "Is there any chance you are pregnant?" "When was your last menstrual period?"     No chance of pregnancy 7. BREASTFEEDING: "Are you  breastfeeding?"     No 8. HORMONE MEDICINES: "Are you taking any hormone medicines, prescription or over-the-counter?" (e.g., birth control pills, estrogen)     No 9. BLOOD THINNER MEDICINES: "Do you take any blood thinners?" (e.g., Coumadin / warfarin, Pradaxa / dabigatran, aspirin)     No 10. CAUSE: "What do you think is causing the bleeding?" (e.g., recent gyn surgery, recent gyn procedure; known bleeding disorder, cervical cancer, polycystic ovarian disease, fibroids)         Unknown 11. HEMODYNAMIC STATUS: "Are you weak or feeling lightheaded?" If Yes, ask: "Can you stand and walk normally?"        Tired but not weak or light headed 12. OTHER SYMPTOMS: "What other symptoms are you having with the bleeding?" (e.g., passed tissue, vaginal discharge, fever, menstrual-type cramps)       Menstrual cramps  Protocols used: Vaginal Bleeding - Abnormal-A-AH

## 2023-06-04 NOTE — Telephone Encounter (Signed)
Pt says she is hurting more and still bleeding, does not want to go ER, wants to try to get [place] sooner at [facility], [place] scheduled for 2/14./25 at 3:30 pm at [facility]. And lab did not put urine in fridge, so need to re collect, she will come by tomorrow and collect another urine.  She knows to have full bladder for [place].

## 2023-06-05 ENCOUNTER — Ambulatory Visit (HOSPITAL_COMMUNITY)
Admission: RE | Admit: 2023-06-05 | Discharge: 2023-06-05 | Disposition: A | Discharge status: Home / Self Care | Payer: Medicaid Other | Source: Ambulatory Visit | Attending: Adult Health | Admitting: Adult Health

## 2023-06-05 DIAGNOSIS — N926 Irregular menstruation, unspecified: Secondary | ICD-10-CM | POA: Diagnosis not present

## 2023-06-05 DIAGNOSIS — R102 Pelvic and perineal pain: Secondary | ICD-10-CM | POA: Diagnosis not present

## 2023-06-05 DIAGNOSIS — N83202 Unspecified ovarian cyst, left side: Secondary | ICD-10-CM | POA: Diagnosis not present

## 2023-06-05 DIAGNOSIS — N939 Abnormal uterine and vaginal bleeding, unspecified: Secondary | ICD-10-CM | POA: Diagnosis not present

## 2023-06-05 LAB — SPECIMEN STATUS REPORT

## 2023-06-05 LAB — URINE CULTURE

## 2023-06-09 ENCOUNTER — Encounter: Payer: Self-pay | Admitting: Internal Medicine

## 2023-06-09 ENCOUNTER — Ambulatory Visit: Payer: Self-pay | Admitting: Family Medicine

## 2023-06-09 ENCOUNTER — Ambulatory Visit: Payer: Medicaid Other | Admitting: Internal Medicine

## 2023-06-09 ENCOUNTER — Encounter: Payer: Self-pay | Admitting: *Deleted

## 2023-06-09 ENCOUNTER — Other Ambulatory Visit: Payer: Self-pay | Admitting: Adult Health

## 2023-06-09 VITALS — BP 143/94 | HR 68 | Ht 63.5 in | Wt 203.6 lb

## 2023-06-09 DIAGNOSIS — R350 Frequency of micturition: Secondary | ICD-10-CM

## 2023-06-09 DIAGNOSIS — N926 Irregular menstruation, unspecified: Secondary | ICD-10-CM | POA: Diagnosis not present

## 2023-06-09 NOTE — Telephone Encounter (Addendum)
Chief Complaint: irregular vaginal bleeding Symptoms: mild intermittent vaginal bleeding worsens with movement/activity, bilateral constant lower abdominal pain, vaginal pain Frequency: since 05/19/23 Pertinent Negatives: Patient denies nausea, vomiting, fever Disposition: [] ED /[] Urgent Care (no appt availability in office) / [x] Appointment(In office/virtual)/ []  [facility]/ [] Home Care/ [] Refused Recommended Disposition /[] [facility]/ []  Follow-up with PCP Additional Notes: Patient states she was told she has a right ovarian cyst last week at [facility] Patient states the bleeding is on and off. She states sometimes its pink to brown and today it is bright red. Bleeding occurs and worsens with movement.Patient requesting to be seen today, states the OBGYN can't fit her in until later this week. Patient states due to her cirrhosis she has not taken much OTC pain meds (Tylenol).  Summary: has been on cycle and bleeding since Jan 28th   Copied From CRM #[id number]. Reason for Triage: Patient has been on her cycle and bleeding since Jan 28th, unable to get in to see her gynecologist.          Reason for Disposition  [1] Constant abdominal pain AND [2] present > 2 hours  Answer Assessment - Initial Assessment Questions 1. AMOUNT: "Describe the bleeding that you are having."    - SPOTTING: spotting, or pinkish / brownish mucous discharge; does not fill panty liner or pad    - MILD:  less than 1 pad / hour; less than patient's usual menstrual bleeding   - MODERATE: 1-2 pads / hour; 1 menstrual cup every 6 hours; small-medium blood clots (e.g., pea, grape, small coin)   - SEVERE: soaking 2 or more pads/hour for 2 or more hours; 1 menstrual cup every 2 hours; bleeding not contained by pads or continuous red blood from vagina; large blood clots (e.g., golf ball, large coin)      Sometimes its dark blood, today it is red blood. She states when she pees it is in her  urine. She states she has to change a pad every few hours. She states at first she had some clots and now there are some blood clots about size of pinky finger tip.  2. ONSET: "When did the bleeding begin?" "Is it continuing now?"     January 28th.  3. MENSTRUAL PERIOD: "When was the last normal menstrual period?" "How is this different than your period?"     Last month she states she did have a normal period. This is the longest she states she has had bleeding.  4. REGULARITY: "How regular are your periods?"     She states she has cirrhosis and had a regular period last month but it has been irregular.  5. ABDOMEN PAIN: "Do you have any pain?" "How bad is the pain?"  (e.g., Scale 1-10; mild, moderate, or severe)   - MILD (1-3): doesn't interfere with normal activities, abdomen soft and not tender to touch    - MODERATE (4-7): interferes with normal activities or awakens from sleep, abdomen tender to touch    - SEVERE (8-10): excruciating pain, doubled over, unable to do any normal activities      Left and right sided lower abdominal pain, gets up to a 5/10.  6. PREGNANCY: "Is there any chance you are pregnant?" "When was your last menstrual period?"     Denies any chance of pregnancy.  7. BREASTFEEDING: "Are you breastfeeding?"     Denies.  8. HORMONE MEDICINES: "Are you taking any hormone medicines, prescription or over-the-counter?" (e.g., birth  control pills, estrogen)     Denies.  9. BLOOD THINNER MEDICINES: "Do you take any blood thinners?" (e.g., Coumadin / warfarin, Pradaxa / dabigatran, aspirin)     Denies.  10. CAUSE: "What do you think is causing the bleeding?" (e.g., recent gyn surgery, recent gyn procedure; known bleeding disorder, cervical cancer, polycystic ovarian disease, fibroids)         Patient unsure, she states she was seen in the ED and told she has a right ovarian cyst.  11. HEMODYNAMIC STATUS: "Are you weak or feeling lightheaded?" If Yes, ask: "Can you stand  and walk normally?"        Intermittent lightheadedness. She states she can walk around normally.   12. OTHER SYMPTOMS: "What other symptoms are you having with the bleeding?" (e.g., passed tissue, vaginal discharge, fever, menstrual-type cramps)       Vaginal pain.  Protocols used: Vaginal Bleeding - Abnormal-A-AH

## 2023-06-09 NOTE — Assessment & Plan Note (Signed)
Presenting today for an acute visit endorsing a several week history of abnormal vaginal bleeding.  She has recently passed clots, prompting her to return to care.  She was evaluated by gynecology on 2/11 and ultimately underwent pelvic ultrasound that was largely unremarkable.  No acute findings to explain recent bleeding.  Per review of recent documentation, she contacted gynecology earlier today and was offered 3 options in the setting of persistent bleeding: Progestin only OCP, ER presentation, or appointment with and MD. Patient requested MD appointment, which is currently scheduled with Dr. Despina Hidden for Thursday (2/20).  She confirms that she is not interested in a progestin only OCP. Pelvic exam deferred today as it would not change management given recent examination and imaging.  -Gynecology appointment currently scheduled for 2/20.  -I discussed with Emily [name] that I do not have any additional treatment options to offer beyond what has already been presented.  I recommended following up with gynecology as scheduled on Thursday. -She has endorsed increased urinary frequency.  Recent UA notable for 3+ blood. Likely reflective of vaginal bleeding vs true hematuria.  Otherwise not consistent with UTI.  Urine culture not completed due to handling error. Will repeat UA and culture today.

## 2023-06-09 NOTE — Progress Notes (Signed)
 Referred to urology.

## 2023-06-09 NOTE — Progress Notes (Signed)
Acute Office Visit  Subjective:     Patient ID: Emily [name], female    DOB: July 04, 1980, 43 y.o.   MRN: [id number]  Chief Complaint  Patient presents with   Vaginal Bleeding    Patient has been off and on bleeding since 05/19/2023, passing blood clots in urine.    Emily [name] presents today for an acute visit in the setting of irregular vaginal bleeding.  Symptoms have been present since late January.  Emily [name] has missed multiple periods and has experienced intermittent bleeding.  Most recently, Emily [name] started passing clots, which prompted Emily [name] to contact Emily [name] PCP and gynecology today.  Of note, Emily [name] was evaluated by gynecology on 2/11.  Urine pregnancy test negative, UA not consistent with UTI and culture not obtained.  Pelvic ultrasound ordered which revealed a small left ovarian cyst but was otherwise unremarkable.  Review of Systems  Genitourinary:        Irregular vaginal bleeding      Objective:    BP (!) 143/94 (BP Location: Left Arm, Patient Position: Sitting, Cuff Size: Normal)   Pulse 68   Ht 5' 3.5" (1.613 m)   Wt 203 lb 9.6 oz (92.4 kg)   LMP 05/19/2023 (Approximate)   SpO2 94%   BMI 35.50 kg/m   Physical Exam Vitals reviewed.  Constitutional:      General: Emily [name] is not in acute distress.    Appearance: Normal appearance. Emily [name] is obese. Emily [name] is not toxic-appearing.  HENT:     Head: Normocephalic and atraumatic.     Right Ear: External ear normal.     Left Ear: External ear normal.     Nose: Nose normal. No congestion or rhinorrhea.     Mouth/Throat:     Mouth: Mucous membranes are moist.     Pharynx: Oropharynx is clear. No oropharyngeal exudate or posterior oropharyngeal erythema.  Eyes:     General: No scleral icterus.    Extraocular Movements: Extraocular movements intact.     Conjunctiva/sclera: Conjunctivae normal.     Pupils: Pupils are equal, round, and reactive to light.  Cardiovascular:     Rate and Rhythm: Normal rate and regular rhythm.     Pulses:  Normal pulses.     Heart sounds: Normal heart sounds. No murmur heard.    No friction rub. No gallop.  Pulmonary:     Effort: Pulmonary effort is normal.     Breath sounds: Normal breath sounds. No wheezing, rhonchi or rales.  Abdominal:     General: Abdomen is flat. Bowel sounds are normal. There is no distension.     Palpations: Abdomen is soft.     Tenderness: There is no abdominal tenderness.  Musculoskeletal:        General: No swelling. Normal range of motion.     Cervical back: Normal range of motion.     Right lower leg: No edema.     Left lower leg: No edema.  Lymphadenopathy:     Cervical: No cervical adenopathy.  Skin:    General: Skin is warm and dry.     Capillary Refill: Capillary refill takes less than 2 seconds.     Coloration: Skin is not jaundiced.  Neurological:     General: No focal deficit present.     Mental Status: Emily [name] is alert and oriented to person, place, and time.  Psychiatric:        Mood and Affect: Mood normal.        Behavior: Behavior normal.  Assessment & Plan:   Problem List Items Addressed This Visit       Irregular bleeding   Presenting today for an acute visit endorsing a several week history of abnormal vaginal bleeding.  Emily [name] has recently passed clots, prompting Emily [name] to return to care.  Emily [name] was evaluated by gynecology on 2/11 and ultimately underwent pelvic ultrasound that was largely unremarkable.  No acute findings to explain recent bleeding.  Per review of recent documentation, Emily [name] contacted gynecology earlier today and was offered 3 options in the setting of persistent bleeding: Progestin only OCP, ER presentation, or appointment with and MD. Patient requested MD appointment, which is currently scheduled with Dr. Despina Hidden for Thursday (2/20).  Emily [name] confirms that Emily [name] is not interested in a progestin only OCP. Pelvic exam deferred today as it would not change management given recent examination and imaging.  -Gynecology appointment currently  scheduled for 2/20.  -I discussed with Ms. Milner that I do not have any additional treatment options to offer beyond what has already been presented.  I recommended following up with gynecology as scheduled on Thursday. -Emily [name] has endorsed increased urinary frequency.  Recent UA notable for 3+ blood. Likely reflective of vaginal bleeding vs true hematuria.  Otherwise not consistent with UTI.  Urine culture not completed due to handling error. Will repeat UA and culture today.      Return if symptoms worsen or fail to improve.  Billie Lade, MD

## 2023-06-09 NOTE — Patient Instructions (Signed)
It was a pleasure to see you today.  Thank you for giving [place] the opportunity to be involved in your care.  Below is a brief recap of your visit and next steps.    Summary I recommend following up with gynecology as discussed. Will check urine again today

## 2023-06-10 ENCOUNTER — Other Ambulatory Visit: Payer: Medicaid Other | Admitting: Radiology

## 2023-06-11 ENCOUNTER — Telehealth: Payer: Medicaid Other | Admitting: Physician Assistant

## 2023-06-11 ENCOUNTER — Ambulatory Visit: Payer: Medicaid Other | Admitting: Obstetrics & Gynecology

## 2023-06-11 ENCOUNTER — Encounter: Payer: Self-pay | Admitting: Family Medicine

## 2023-06-11 ENCOUNTER — Telehealth: Payer: Self-pay

## 2023-06-11 DIAGNOSIS — J0100 Acute maxillary sinusitis, unspecified: Secondary | ICD-10-CM | POA: Diagnosis not present

## 2023-06-11 MED ORDER — AZITHROMYCIN 250 MG PO TABS
ORAL_TABLET | ORAL | 0 refills | Status: DC
Start: 2023-06-11 — End: 2023-06-15

## 2023-06-11 MED ORDER — PSEUDOEPHEDRINE HCL ER 120 MG PO TB12: 120.0000 mg | ORAL_TABLET | Freq: Two times a day (BID) | ORAL | 0 refills | Status: DC

## 2023-06-11 NOTE — Telephone Encounter (Deleted)
Copied from CRM [id number]. Topic: Clinical - Lab/Test Results >> Jun 11, 2023  3:30 PM Kristie Cowman wrote: Reason for CRM: Patient calling in regarding urinalysis results.

## 2023-06-11 NOTE — Progress Notes (Signed)
Virtual Visit Consent   Emily [name], you are scheduled for a virtual visit with a [facility] provider today. Just as with appointments in the office, your consent must be obtained to participate. Your consent will be active for this visit and any virtual visit you may have with one of our providers in the next 365 days. If you have a MyChart account, a copy of this consent can be sent to you electronically.  As this is a virtual visit, video technology does not allow for your provider to perform a traditional examination. This may limit your provider's ability to fully assess your condition. If your provider identifies any concerns that need to be evaluated in person or the need to arrange testing (such as labs, EKG, etc.), we will make arrangements to do so. Although advances in technology are sophisticated, we cannot ensure that it will always work on either your end or our end. If the connection with a video visit is poor, the visit may have to be switched to a telephone visit. With either a video or telephone visit, we are not always able to ensure that we have a secure connection.  By engaging in this virtual visit, you consent to the provision of healthcare and authorize for your insurance to be billed (if applicable) for the services provided during this visit. Depending on your insurance coverage, you may receive a charge related to this service.  I need to obtain your verbal consent now. Are you willing to proceed with your visit today? Emily [name] has provided verbal consent on 06/11/2023 for a virtual visit (video or telephone). Emily [name], [place]  Date: 06/11/2023 7:54 PM   Virtual Visit via Video Note   I, Emily [name], connected with  Emily [name]  ([id number], May 27, 1980) on 06/11/23 at  7:45 PM EST by a video-enabled telemedicine application and verified that I am speaking with the correct person using two identifiers.  Location: Patient: Virtual Visit Location  Patient: Home Provider: Virtual Visit Location Provider: Home Office   I discussed the limitations of evaluation and management by telemedicine and the availability of in person appointments. The patient expressed understanding and agreed to proceed.    History of Present Illness: Emily [name] is a 43 y.o. who identifies as a female who was assigned female at birth, and is being seen today for sinus problems.  HPI: 43 y/o F presents to the video telehealth visit for c/o sinus pressure, pain, post nasal drainage, cough x few days and getting worse. Has tried otc medicine without much relief.  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   Sebaceous cyst of labia 02/03/2023   Cyst of left ovary 08/11/2022   ASCUS with positive high risk HPV cervical 08/11/2022   [place] 07/30/2022   Hot flashes 07/30/2022   Friable cervix 07/30/2022   Spotting 07/30/2022   Amenorrhea, secondary 07/30/2022   Bartholin cyst 04/30/2022   Irregular periods 04/30/2022   Dental caries 03/11/2022   Tick bite of back 12/20/2020   Chronic bronchitis (HCC) 07/09/2020   Breast tenderness 09/20/2018   Gastroesophageal reflux disease 09/16/2018   Shortness of breath 08/05/2018   Abnormal finding on lung imaging 08/05/2018   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  Vaginal odor 09/30/2017   BV (bacterial vaginosis) 09/30/2017   Swollen lymph nodes 01/06/2017   Vaginal irritation 10/08/2016   RUQ pain 10/08/2016   LLQ pain 10/08/2016   Vaginal discharge 10/08/2016   Menstrual period late 10/08/2016   Cyst of right Bartholin's gland 12/17/2015   Recurrent boils 06/18/2015   Type 2 diabetes mellitus with complication, without long-term current use of insulin (HCC) 04/18/2015    Ingrowing toenail 04/18/2015   Vitamin D deficiency 04/18/2015   Angular cheilitis 04/18/2015   Dyslipidemia 04/18/2015   Diabetes mellitus with complication in adult patient (HCC) 02/20/2015   Smoker 02/20/2015   Essential hypertension 02/20/2015   Pain in limb 02/20/2015   Myalgia 02/20/2015   Hypothyroidism 02/20/2015   Leg pain, inferior 02/20/2015   Toenail deformity 02/20/2015   Elevated liver enzymes 01/07/2012   Abnormal CT of liver 09/30/2011   Fatty liver 09/30/2011   G E REFLUX 11/30/2007   CHEST PAIN-UNSPECIFIED 11/30/2007   HEADACHE 10/21/2007   DYSPNEA 09/15/2007   UNSPECIFIED TACHYCARDIA 06/01/2007   COUGH 05/24/2007   URINARY INCONTINENCE 05/24/2007   TOBACCO ABUSE 05/20/2007   Pulmonary nodules 05/12/2007   DEPRESSION 04/19/2007   Asthma 04/19/2007    Allergies:  Allergies  Allergen Reactions   Bactrim [Sulfamethoxazole-Trimethoprim] Anaphylaxis and Other (See Comments)    Chest pain, trouble breathing   Ciprofloxacin Palpitations   Clindamycin/Lincomycin     Other reaction(s): Chest Pain   Flagyl [Metronidazole] Shortness Of Breath   Hydrocodone Other (See Comments)   Iodinated Contrast Media Swelling   Ondansetron Shortness Of Breath   Liraglutide Nausea And Vomiting   Doxycycline Nausea And Vomiting   Penicillins    Prednisone Other (See Comments)    Makes her angry and irritable   Medications:  Current Outpatient Medications:    azithromycin (ZITHROMAX) 250 MG tablet, Take 2 tablets on day 1, then 1 tablet daily on days 2 through 5, Disp: 6 tablet, Rfl: 0   pseudoephedrine (SUDAFED) 120 MG 12 hr tablet, Take 1 tablet (120 mg total) by mouth 2 (two) times daily., Disp: 20 tablet, Rfl: 0   ACCU-CHEK AVIVA PLUS test strip, 1 each by Other route 3 (three) times daily., Disp: , Rfl:    ACCU-CHEK FASTCLIX LANCETS MISC, USE AS DIRECTED, Disp: 102 each, Rfl: 3   albuterol (VENTOLIN HFA) 108 (90 Base) MCG/ACT inhaler, Inhale 2 puffs into the lungs every  6 (six) hours as needed for wheezing or shortness of breath., Disp: 18 g, Rfl: 1   amitriptyline (ELAVIL) 25 MG tablet, Take 50 mg by mouth at bedtime., Disp: , Rfl:    amLODipine (NORVASC) 5 MG tablet, TAKE 1 TABLET (5 MG TOTAL) BY MOUTH DAILY., Disp: 90 tablet, Rfl: 3   Blood Glucose Monitoring Suppl (ACCU-CHEK AVIVA PLUS) w/Device KIT, USE AS DIRECTED, Disp: 1 kit, Rfl: 0   Blood Glucose Monitoring Suppl DEVI, 1 each by Does not apply route in the morning, at noon, and at bedtime. May substitute to any manufacturer covered by patient's insurance., Disp: 1 each, Rfl: 0   clonazePAM (KLONOPIN) 0.5 MG disintegrating tablet, Take 0.5 mg by mouth daily as needed., Disp: , Rfl:    Continuous Glucose Receiver (DEXCOM G7 RECEIVER) DEVI, 1 Device by Does not apply route continuous., Disp: 1 each, Rfl: 0   Continuous Glucose Sensor (DEXCOM G7 SENSOR) MISC, 1 Device by Does not apply route continuous., Disp: 9 each, Rfl: 0   Empagliflozin-metFORMIN HCl ER (SYNJARDY XR) 12.08-998 MG TB24, Take 2 tablets  by mouth daily., Disp: 60 tablet, Rfl: 5   fluticasone-salmeterol (ADVAIR) 250-50 MCG/ACT AEPB, Inhale 1 puff into the lungs every 12 (twelve) hours. Rinse after use., Disp: 60 each, Rfl: 5   gabapentin (NEURONTIN) 100 MG capsule, Take 1 capsule (100 mg total) by mouth 3 (three) times daily., Disp: 90 capsule, Rfl: 3   INCRUSE ELLIPTA 62.5 MCG/ACT AEPB, daily as needed., Disp: , Rfl:    insulin lispro (HUMALOG KWIKPEN) 100 UNIT/ML KwikPen, INJECT TEN UNITS INTO THE SKIN 3 (THREE) TIMES DAILY WITH MEALS., Disp: 15 mL, Rfl: 3   ipratropium-albuterol (DUONEB) 0.5-2.5 (3) MG/3ML SOLN, Take 3 mLs by nebulization in the morning, at noon, and at bedtime., Disp: 270 mL, Rfl: 11   lamoTRIgine (LAMICTAL) 150 MG tablet, Take 150 mg by mouth daily., Disp: , Rfl:    levothyroxine (SYNTHROID) 75 MCG tablet, Take 1 tablet (75 mcg total) by mouth daily before breakfast., Disp: 90 tablet, Rfl: 0   montelukast (SINGULAIR) 10 MG  tablet, Take 1 tablet (10 mg total) by mouth at bedtime., Disp: 90 tablet, Rfl: 3   naproxen (NAPROSYN) 500 MG tablet, Take 1 tablet (500 mg total) by mouth 2 (two) times daily with a meal., Disp: 30 tablet, Rfl: 0   Nebulizers (COMPRESSOR/NEBULIZER) MISC, Use as directed, Disp: 1 each, Rfl: 0   nitroGLYCERIN (NITROSTAT) 0.4 MG SL tablet, Place 1 tablet (0.4 mg total) under the tongue every 5 (five) minutes as needed for chest pain., Disp: 25 tablet, Rfl: 3   omeprazole (PRILOSEC) 40 MG capsule, Take 40 mg by mouth every morning., Disp: , Rfl:    OVER THE COUNTER MEDICATION, Take by mouth as needed. Maalox lidocain 2% takes 15-30 mls, Disp: , Rfl:    PARoxetine (PAXIL) 10 MG tablet, Take 10 mg by mouth at bedtime., Disp: , Rfl:    pioglitazone (ACTOS) 15 MG tablet, Take 1 tablet (15 mg total) by mouth daily., Disp: 30 tablet, Rfl: 0   promethazine (PHENERGAN) 12.5 MG tablet, Take 12.5 mg by mouth every 8 (eight) hours as needed., Disp: , Rfl:    propranolol (INDERAL) 20 MG tablet, Take 1 tablet (20 mg total) by mouth 3 (three) times daily., Disp: 270 tablet, Rfl: 3   Respiratory Therapy Supplies (NEBULIZER/TUBING/MOUTHPIECE) KIT, Use as directed, Disp: 10 kit, Rfl: 11   traZODone (DESYREL) 50 MG tablet, Take 50-100 mg by mouth at bedtime., Disp: , Rfl:    ziprasidone (GEODON) 60 MG capsule, Take 60 mg by mouth daily., Disp: , Rfl:   Observations/Objective: Patient is well-developed, well-nourished in no acute distress.  Resting comfortably  at home.  Head is normocephalic, atraumatic.  No labored breathing.  Speech is clear and coherent with logical content.  Patient is alert and oriented at baseline.    Assessment and Plan: 1. Acute non-recurrent maxillary sinusitis (Primary) - azithromycin (ZITHROMAX) 250 MG tablet; Take 2 tablets on day 1, then 1 tablet daily on days 2 through 5  Dispense: 6 tablet; Refill: 0 - pseudoephedrine (SUDAFED) 120 MG 12 hr tablet; Take 1 tablet (120 mg total)  by mouth 2 (two) times daily.  Dispense: 20 tablet; Refill: 0  Increase fluids Use otc Flonase nasal spray Start a humidifier. Start with oral decongestant ie Sudafed as prescribed If symptoms don't improve then consider taking oral antibiotic, Zpack, as prescribed. Pt has allergies to many antibiotics, but able to take Zpak. She c/o about abdominal pain along with nausea since earlier today. She has previously prescribed phenergan at home to  take if needed. Recommended to f/u with PCP or go to Urgent Care clinic for further evaluation. Pt verbalized understanding and in agreement.    Follow Up Instructions: I discussed the assessment and treatment plan with the patient. The patient was provided an opportunity to ask questions and all were answered. The patient agreed with the plan and demonstrated an understanding of the instructions.  A copy of instructions were sent to the patient via MyChart unless otherwise noted below.   Patient has requested to receive PHI (AVS, Work Notes, etc) pertaining to this video visit through e-mail as they are currently without active MyChart. They have voiced understand that email is not considered secure and their health information could be viewed by someone other than the patient.   The patient was advised to call back or seek an in-person evaluation if the symptoms worsen or if the condition fails to improve as anticipated.    Emily Better, PA-C

## 2023-06-11 NOTE — Telephone Encounter (Addendum)
Patient called for urine results. Advised not ready by provider. I called PCP office and was told by Nelva Bush that patient will need to reach out to the ordering provider. I called [facility] and was advised by Caelyn to send this message to the office and she will send it to [facility], CMA to call patient back. Patient advised, she verbalized understanding.   Copied from CRM [id number]. Topic: Clinical - Lab/Test Results >> Jun 11, 2023  3:30 PM Kristie Cowman wrote: Reason for CRM: Patient calling in regarding urinalysis results.

## 2023-06-11 NOTE — Telephone Encounter (Signed)
 This encounter was created in error - please disregard.

## 2023-06-11 NOTE — Telephone Encounter (Signed)
Copied from CRM [id number]. Topic: Clinical - Lab/Test Results >> Jun 11, 2023  1:26 PM Suzette B wrote: Reason for CRM: Please call patient in regards to lab recent results [phone]

## 2023-06-11 NOTE — Patient Instructions (Addendum)
Emily [name], thank you for joining Emily Better, PA-C for today's virtual visit.  While this provider is not your primary care provider (PCP), if your PCP is located in our provider database this encounter information will be shared with them immediately following your visit.   A [facility] MyChart account gives you access to today's visit and all your visits, tests, and labs performed at [facility] " click here if you don't have a [facility] MyChart account or go to [email or web address]  Consent: (Patient) Emily [name] provided verbal consent for this virtual visit at the beginning of the encounter.  Current Medications:  Current Outpatient Medications:    azithromycin (ZITHROMAX) 250 MG tablet, Take 2 tablets on day 1, then 1 tablet daily on days 2 through 5, Disp: 6 tablet, Rfl: 0   pseudoephedrine (SUDAFED) 120 MG 12 hr tablet, Take 1 tablet (120 mg total) by mouth 2 (two) times daily., Disp: 20 tablet, Rfl: 0   ACCU-CHEK AVIVA PLUS test strip, 1 each by Other route 3 (three) times daily., Disp: , Rfl:    ACCU-CHEK FASTCLIX LANCETS MISC, USE AS DIRECTED, Disp: 102 each, Rfl: 3   albuterol (VENTOLIN HFA) 108 (90 Base) MCG/ACT inhaler, Inhale 2 puffs into the lungs every 6 (six) hours as needed for wheezing or shortness of breath., Disp: 18 g, Rfl: 1   amitriptyline (ELAVIL) 25 MG tablet, Take 50 mg by mouth at bedtime., Disp: , Rfl:    amLODipine (NORVASC) 5 MG tablet, TAKE 1 TABLET (5 MG TOTAL) BY MOUTH DAILY., Disp: 90 tablet, Rfl: 3   Blood Glucose Monitoring Suppl (ACCU-CHEK AVIVA PLUS) w/Device KIT, USE AS DIRECTED, Disp: 1 kit, Rfl: 0   Blood Glucose Monitoring Suppl DEVI, 1 each by Does not apply route in the morning, at noon, and at bedtime. May substitute to any manufacturer covered by patient's insurance., Disp: 1 each, Rfl: 0   clonazePAM (KLONOPIN) 0.5 MG disintegrating tablet, Take 0.5 mg by mouth daily as needed., Disp: , Rfl:    Continuous  Glucose Receiver (DEXCOM G7 RECEIVER) DEVI, 1 Device by Does not apply route continuous., Disp: 1 each, Rfl: 0   Continuous Glucose Sensor (DEXCOM G7 SENSOR) MISC, 1 Device by Does not apply route continuous., Disp: 9 each, Rfl: 0   Empagliflozin-metFORMIN HCl ER (SYNJARDY XR) 12.08-998 MG TB24, Take 2 tablets by mouth daily., Disp: 60 tablet, Rfl: 5   fluticasone-salmeterol (ADVAIR) 250-50 MCG/ACT AEPB, Inhale 1 puff into the lungs every 12 (twelve) hours. Rinse after use., Disp: 60 each, Rfl: 5   gabapentin (NEURONTIN) 100 MG capsule, Take 1 capsule (100 mg total) by mouth 3 (three) times daily., Disp: 90 capsule, Rfl: 3   INCRUSE ELLIPTA 62.5 MCG/ACT AEPB, daily as needed., Disp: , Rfl:    insulin lispro (HUMALOG KWIKPEN) 100 UNIT/ML KwikPen, INJECT TEN UNITS INTO THE SKIN 3 (THREE) TIMES DAILY WITH MEALS., Disp: 15 mL, Rfl: 3   ipratropium-albuterol (DUONEB) 0.5-2.5 (3) MG/3ML SOLN, Take 3 mLs by nebulization in the morning, at noon, and at bedtime., Disp: 270 mL, Rfl: 11   lamoTRIgine (LAMICTAL) 150 MG tablet, Take 150 mg by mouth daily., Disp: , Rfl:    levothyroxine (SYNTHROID) 75 MCG tablet, Take 1 tablet (75 mcg total) by mouth daily before breakfast., Disp: 90 tablet, Rfl: 0   montelukast (SINGULAIR) 10 MG tablet, Take 1 tablet (10 mg total) by mouth at bedtime., Disp: 90 tablet, Rfl: 3   naproxen (NAPROSYN) 500 MG tablet, Take 1 tablet (  500 mg total) by mouth 2 (two) times daily with a meal., Disp: 30 tablet, Rfl: 0   Nebulizers (COMPRESSOR/NEBULIZER) MISC, Use as directed, Disp: 1 each, Rfl: 0   nitroGLYCERIN (NITROSTAT) 0.4 MG SL tablet, Place 1 tablet (0.4 mg total) under the tongue every 5 (five) minutes as needed for chest pain., Disp: 25 tablet, Rfl: 3   omeprazole (PRILOSEC) 40 MG capsule, Take 40 mg by mouth every morning., Disp: , Rfl:    OVER THE COUNTER MEDICATION, Take by mouth as needed. Maalox lidocain 2% takes 15-30 mls, Disp: , Rfl:    PARoxetine (PAXIL) 10 MG tablet, Take  10 mg by mouth at bedtime., Disp: , Rfl:    pioglitazone (ACTOS) 15 MG tablet, Take 1 tablet (15 mg total) by mouth daily., Disp: 30 tablet, Rfl: 0   promethazine (PHENERGAN) 12.5 MG tablet, Take 12.5 mg by mouth every 8 (eight) hours as needed., Disp: , Rfl:    propranolol (INDERAL) 20 MG tablet, Take 1 tablet (20 mg total) by mouth 3 (three) times daily., Disp: 270 tablet, Rfl: 3   Respiratory Therapy Supplies (NEBULIZER/TUBING/MOUTHPIECE) KIT, Use as directed, Disp: 10 kit, Rfl: 11   traZODone (DESYREL) 50 MG tablet, Take 50-100 mg by mouth at bedtime., Disp: , Rfl:    ziprasidone (GEODON) 60 MG capsule, Take 60 mg by mouth daily., Disp: , Rfl:    Medications ordered in this encounter:  Meds ordered this encounter  Medications   azithromycin (ZITHROMAX) 250 MG tablet    Sig: Take 2 tablets on day 1, then 1 tablet daily on days 2 through 5    Dispense:  6 tablet    Refill:  0    Supervising Provider:   Merrilee Jansky [[id number]]   pseudoephedrine (SUDAFED) 120 MG 12 hr tablet    Sig: Take 1 tablet (120 mg total) by mouth 2 (two) times daily.    Dispense:  20 tablet    Refill:  0    Supervising Provider:   Merrilee Jansky [[id number]]     *If you need refills on other medications prior to your next appointment, please contact your pharmacy*  Follow-Up: Call back or seek an in-person evaluation if the symptoms worsen or if the condition fails to improve as anticipated.  [facility] [phone]  Other Instructions Increase fluids Use otc Flonase nasal spray Start a humidifier. Start with oral decongestant ie Sudafed as prescribed If symptoms don't improve then consider taking oral antibiotic, Zpack, as prescribed. She c/o about abdominal pain along with nausea since earlier today. She has previously prescribed phenergan at home to take if needed. Recommended to f/u with PCP or go to Urgent Care clinic for further evaluation.   If you have been instructed to have  an in-person evaluation today at a local Urgent Care facility, please use the link below. It will take you to a list of all of our available [facility], including address, phone number and hours of operation. Please do not delay care.  [facility]  If you or a family member do not have a primary care provider, use the link below to schedule a visit and establish care. When you choose a [facility] primary care physician or advanced practice provider, you gain a long-term partner in health. Find a Primary Care Provider  Learn more about [facility]'s in-office and virtual care options: [facility] - Get Care Now

## 2023-06-12 ENCOUNTER — Telehealth: Payer: Self-pay

## 2023-06-12 ENCOUNTER — Telehealth (HOSPITAL_COMMUNITY): Payer: Self-pay

## 2023-06-12 ENCOUNTER — Encounter: Payer: Self-pay | Admitting: Internal Medicine

## 2023-06-12 LAB — UA/M W/RFLX CULTURE, ROUTINE
Bilirubin, UA: NEGATIVE
Ketones, UA: NEGATIVE
Nitrite, UA: NEGATIVE
Protein,UA: NEGATIVE
Specific Gravity, UA: 1.027 (ref 1.005–1.030)
Urobilinogen, Ur: 0.2 mg/dL (ref 0.2–1.0)
pH, UA: 6.5 (ref 5.0–7.5)

## 2023-06-12 LAB — MICROSCOPIC EXAMINATION: Casts: NONE SEEN /LPF

## 2023-06-12 LAB — URINE CULTURE, REFLEX

## 2023-06-12 NOTE — Telephone Encounter (Signed)
Copied from CRM [id number]. Topic: Clinical - Lab/Test Results >> Jun 11, 2023  4:14 PM Yolanda T wrote: Reason for CRM: patient called to f/u on lab results. Please reach out to patient

## 2023-06-12 NOTE — Telephone Encounter (Signed)
Lvm to confirm 06/15/23 appt

## 2023-06-12 NOTE — Telephone Encounter (Signed)
Awaiting urine culture

## 2023-06-12 NOTE — Telephone Encounter (Signed)
Called pt and pt is aware

## 2023-06-15 ENCOUNTER — Encounter (HOSPITAL_COMMUNITY): Payer: Self-pay

## 2023-06-15 ENCOUNTER — Encounter (HOSPITAL_COMMUNITY): Payer: Self-pay | Admitting: Psychiatry

## 2023-06-15 ENCOUNTER — Ambulatory Visit (HOSPITAL_COMMUNITY): Payer: Medicaid Other | Admitting: Psychiatry

## 2023-06-15 DIAGNOSIS — F4001 Agoraphobia with panic disorder: Secondary | ICD-10-CM

## 2023-06-15 DIAGNOSIS — F332 Major depressive disorder, recurrent severe without psychotic features: Secondary | ICD-10-CM | POA: Diagnosis not present

## 2023-06-15 DIAGNOSIS — F502 Bulimia nervosa, unspecified: Secondary | ICD-10-CM | POA: Insufficient documentation

## 2023-06-15 DIAGNOSIS — F5023 Bulimia nervosa, severe: Secondary | ICD-10-CM

## 2023-06-15 DIAGNOSIS — F411 Generalized anxiety disorder: Secondary | ICD-10-CM | POA: Insufficient documentation

## 2023-06-15 DIAGNOSIS — F17210 Nicotine dependence, cigarettes, uncomplicated: Secondary | ICD-10-CM | POA: Diagnosis not present

## 2023-06-15 DIAGNOSIS — F4310 Post-traumatic stress disorder, unspecified: Secondary | ICD-10-CM | POA: Diagnosis not present

## 2023-06-15 DIAGNOSIS — Z789 Other specified health status: Secondary | ICD-10-CM | POA: Diagnosis not present

## 2023-06-15 DIAGNOSIS — F15982 Other stimulant use, unspecified with stimulant-induced sleep disorder: Secondary | ICD-10-CM

## 2023-06-15 DIAGNOSIS — F17200 Nicotine dependence, unspecified, uncomplicated: Secondary | ICD-10-CM

## 2023-06-15 DIAGNOSIS — F603 Borderline personality disorder: Secondary | ICD-10-CM

## 2023-06-15 DIAGNOSIS — Z87898 Personal history of other specified conditions: Secondary | ICD-10-CM

## 2023-06-15 DIAGNOSIS — Z9152 Personal history of nonsuicidal self-harm: Secondary | ICD-10-CM | POA: Diagnosis not present

## 2023-06-15 DIAGNOSIS — Z79899 Other long term (current) drug therapy: Secondary | ICD-10-CM | POA: Diagnosis not present

## 2023-06-15 NOTE — Patient Instructions (Addendum)
 I will defer to your psychiatrist at [facility] for how they want to manage your medications moving forward but as we discussed you may want to do a slow taper off of the clonazepam at 0.25 mg nightly for 1 month and then discontinue.  Similarly, lamotrigine is metabolized hepatically and given that you do not have bipolar disorder from my assessment your psychiatrist may want to consider alternatives such as lithium since it does appear to be helping your depression augmentation treatment.  Safer alternatives for liver cirrhosis if an antipsychotic is going to continue to be used would be paliperidone, Abilify, lurasidone but again will defer to your psychiatrist at [facility].  If you want to switch from Paxil then you can do so by changing to Zoloft (sertraline) but you will want to continue to monitor your bleed time if you continue to take naproxen while having liver cirrhosis as these things can impair platelet function.  Please coordinate with your PCP to get a nutrition referral and you may want to consider a residential program to get your nutrition back on line for the bulimia.  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  Please start to cut back on the caffeine you are consuming by half a unit (unit is whatever the caffeine is coming and whether that is a bottle, cup, can etc.) every 5 days until you are consuming no more than 1 unit of caffeine first thing in the morning.  This should significantly help your anxiety and insomnia.  Be on the lookout for a phone call from my front desk and they should get you on one of the therapist schedules here at the clinic.  In the meantime I want you to start using the DBT (dialectical behavioral therapy) workbook which can  be found here: [email or web address]
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
## 2023-06-16 ENCOUNTER — Ambulatory Visit: Payer: Medicaid Other | Admitting: Obstetrics & Gynecology

## 2023-06-16 ENCOUNTER — Other Ambulatory Visit: Payer: Self-pay | Admitting: Family Medicine

## 2023-06-16 ENCOUNTER — Other Ambulatory Visit: Payer: Medicaid Other

## 2023-06-16 DIAGNOSIS — E118 Type 2 diabetes mellitus with unspecified complications: Secondary | ICD-10-CM

## 2023-06-16 DIAGNOSIS — F5023 Bulimia nervosa, severe: Secondary | ICD-10-CM

## 2023-06-16 NOTE — Progress Notes (Signed)
 Placing a referral to nutrition at the request of Dr. Tia Masker with [facility] for bulimia and diabetes. He recommended for solely bulimia, but she could benefit from a diabetic standpoint.

## 2023-06-17 ENCOUNTER — Ambulatory Visit: Payer: Self-pay | Admitting: Family Medicine

## 2023-06-17 ENCOUNTER — Ambulatory Visit: Payer: Medicaid Other | Admitting: Medical

## 2023-06-17 ENCOUNTER — Ambulatory Visit: Payer: Medicaid Other | Admitting: Family Medicine

## 2023-06-17 ENCOUNTER — Encounter: Payer: Self-pay | Admitting: Family Medicine

## 2023-06-17 VITALS — BP 124/82 | HR 77 | Temp 98.6°F | Wt 202.0 lb

## 2023-06-17 DIAGNOSIS — M25361 Other instability, right knee: Secondary | ICD-10-CM | POA: Diagnosis not present

## 2023-06-17 DIAGNOSIS — R110 Nausea: Secondary | ICD-10-CM | POA: Diagnosis not present

## 2023-06-17 DIAGNOSIS — K219 Gastro-esophageal reflux disease without esophagitis: Secondary | ICD-10-CM | POA: Diagnosis not present

## 2023-06-17 DIAGNOSIS — R109 Unspecified abdominal pain: Secondary | ICD-10-CM | POA: Diagnosis not present

## 2023-06-17 DIAGNOSIS — S8991XA Unspecified injury of right lower leg, initial encounter: Secondary | ICD-10-CM | POA: Diagnosis not present

## 2023-06-17 DIAGNOSIS — R1084 Generalized abdominal pain: Secondary | ICD-10-CM | POA: Diagnosis not present

## 2023-06-17 MED ORDER — PREDNISONE 20 MG PO TABS: ORAL_TABLET | ORAL | 0 refills | Status: DC

## 2023-06-17 NOTE — Progress Notes (Signed)
 Emily [name] , 09-Dec-1980, 43 y.o., female MRN: [id number] Patient Care Team    Relationship Specialty Notifications Start End  Alveria Apley, NP PCP - General Family Medicine  06/17/23   Rollene Rotunda, MD PCP - Cardiology Cardiology  10/28/22    Comment: Previous Nahser pt-requested switch-both MD's approved via chart  Adline Potter, NP Nurse Practitioner Obstetrics and Gynecology  09/24/22   Ponciano Ort, [facility]    09/24/22   Kalman Shan, MD Consulting Physician Pulmonary Disease  09/24/22   Nahser, Deloris Ping, MD Consulting Physician Cardiology  09/24/22     Chief Complaint  Patient presents with   Knee Pain    2 days ago pt hit her knee up against a hard tote and since has had constant pain. Pain gets worse when weight is applied. Pt has taken naproxen for the pain.      Subjective: Emily [name] is a 43 y.o. Pt presents for an OV with complaints of knee pain of 2 days duration after hitting knee against a hard surface. She reports constant pain since that time. .  Associated symptoms include popping sensation. No prior knee injuries or surgeries.  Pt endorses pain with weight bearing and any range of motion. She feels like her knee is going to give out on her.   Pt has tried naproxen to ease their symptoms.  She reports hearing a pop when she hit her knee, she had instant swelling of knee.      06/15/2023    4:53 PM 02/16/2023    3:53 PM 12/01/2022    1:58 PM 10/02/2022    1:46 PM 09/24/2022    2:00 PM  Depression screen PHQ 2/9  Decreased Interest  0 0 0 0  Down, Depressed, Hopeless  0 0  0  PHQ - 2 Score  0 0 0 0  Altered sleeping  0 0  0  Tired, decreased energy  1 0  0  Change in appetite  1 0  0  Feeling bad or failure about yourself   0 0  0  Trouble concentrating  0 0  0  Moving slowly or fidgety/restless  0 0  0  Suicidal thoughts  0 0  0  PHQ-9 Score  2 0  0  Difficult doing work/chores  Somewhat difficult Not  difficult at all  Somewhat difficult     Information is confidential and restricted. Go to Review Flowsheets to unlock data.    Allergies  Allergen Reactions   Bactrim [Sulfamethoxazole-Trimethoprim] Anaphylaxis and Other (See Comments)    Chest pain, trouble breathing   Ciprofloxacin Palpitations   Clindamycin/Lincomycin     Other reaction(s): Chest Pain   Flagyl [Metronidazole] Shortness Of Breath   Hydrocodone Other (See Comments)   Iodinated Contrast Media Swelling   Ondansetron Shortness Of Breath   Liraglutide Nausea And Vomiting   Doxycycline Nausea And Vomiting   Penicillins    Social History   Social History Narrative   Not on file   Past Medical History:  Diagnosis Date   Abnormal CT of liver    Anxiety    Asthma    Asthma    Bipolar 1 disorder (HCC)    Bipolar disorder (HCC)    Chronic pain    Cirrhosis (HCC)    Depression    Diabetes mellitus without complication (HCC)    Fatty liver    Gallstones    GERD (gastroesophageal  reflux disease)    Headache(784.0)    Hyperlipidemia    Hypertension    Hypothyroidism    Lupus (systemic lupus erythematosus) (HCC)    Pulmonary nodule    Tachycardia    Thyroid disease    Vitamin D deficiency    Past Surgical History:  Procedure Laterality Date   DILATION AND CURETTAGE OF UTERUS     1 yr ago   UPPER GI ENDOSCOPY  08/28/2022   Family History  Problem Relation Age of Onset   Asthma Mother    Valvular heart disease Father        Mitral valve repair Dr. Cornelius Moras 2007   Diabetes Maternal Grandmother    Hypertension Maternal Grandmother    Allergies as of 06/17/2023       Reactions   Bactrim [sulfamethoxazole-trimethoprim] Anaphylaxis, Other (See Comments)   Chest pain, trouble breathing   Ciprofloxacin Palpitations   Clindamycin/lincomycin    Other reaction(s): Chest Pain   Flagyl [metronidazole] Shortness Of Breath   Hydrocodone Other (See Comments)   Iodinated Contrast Media Swelling   Ondansetron  Shortness Of Breath   Liraglutide Nausea And Vomiting   Doxycycline Nausea And Vomiting   Penicillins         Medication List        Accurate as of June 17, 2023 12:04 PM. If you have any questions, ask your nurse or doctor.          Accu-Chek Aviva Plus test strip Generic drug: glucose blood 1 each by Other route 3 (three) times daily.   Accu-Chek Aviva Plus w/Device Kit USE AS DIRECTED   Blood Glucose Monitoring Suppl Devi 1 each by Does not apply route in the morning, at noon, and at bedtime. May substitute to any manufacturer covered by patient's insurance.   Accu-Chek FastClix Lancets Misc USE AS DIRECTED   albuterol 108 (90 Base) MCG/ACT inhaler Commonly known as: VENTOLIN HFA Inhale 2 puffs into the lungs every 6 (six) hours as needed for wheezing or shortness of breath.   amLODipine 5 MG tablet Commonly known as: NORVASC TAKE 1 TABLET (5 MG TOTAL) BY MOUTH DAILY.   clonazePAM 0.5 MG disintegrating tablet Commonly known as: KLONOPIN Take 0.5 mg by mouth daily as needed.   Compressor/Nebulizer Misc Use as directed   Dexcom G7 Receiver Devi 1 Device by Does not apply route continuous.   Dexcom G7 Sensor Misc 1 Device by Does not apply route continuous.   fluticasone-salmeterol 250-50 MCG/ACT Aepb Commonly known as: ADVAIR Inhale 1 puff into the lungs every 12 (twelve) hours. Rinse after use.   Incruse Ellipta 62.5 MCG/ACT Aepb Generic drug: umeclidinium bromide daily as needed.   ipratropium-albuterol 0.5-2.5 (3) MG/3ML Soln Commonly known as: DUONEB Take 3 mLs by nebulization in the morning, at noon, and at bedtime.   lamoTRIgine 150 MG tablet Commonly known as: LAMICTAL Take 150 mg by mouth daily.   levothyroxine 75 MCG tablet Commonly known as: SYNTHROID Take 1 tablet (75 mcg total) by mouth daily before breakfast.   montelukast 10 MG tablet Commonly known as: SINGULAIR Take 1 tablet (10 mg total) by mouth at bedtime.   naproxen  500 MG tablet Commonly known as: Naprosyn Take 1 tablet (500 mg total) by mouth 2 (two) times daily with a meal.   Nebulizer/Tubing/Mouthpiece Kit Use as directed   nitroGLYCERIN 0.4 MG SL tablet Commonly known as: NITROSTAT Place 1 tablet (0.4 mg total) under the tongue every 5 (five) minutes as needed for chest  pain.   omeprazole 40 MG capsule Commonly known as: PRILOSEC Take 40 mg by mouth every morning.   OVER THE COUNTER MEDICATION Take by mouth as needed. Maalox lidocain 2% takes 15-30 mls   PARoxetine 10 MG tablet Commonly known as: PAXIL Take 10 mg by mouth at bedtime.   predniSONE 20 MG tablet Commonly known as: DELTASONE 60 mg x3d, 40 mg x3d, 20 mg x2d, 10 mg x2d Started by: Felix Pacini   promethazine 12.5 MG tablet Commonly known as: PHENERGAN Take 12.5 mg by mouth every 8 (eight) hours as needed.   propranolol 20 MG tablet Commonly known as: INDERAL Take 1 tablet (20 mg total) by mouth 3 (three) times daily.   pseudoephedrine 120 MG 12 hr tablet Commonly known as: SUDAFED Take 1 tablet (120 mg total) by mouth 2 (two) times daily.   Synjardy XR 12.08-998 MG Tb24 Generic drug: Empagliflozin-metFORMIN HCl ER Take 2 tablets by mouth daily.   ziprasidone 60 MG capsule Commonly known as: GEODON Take 60 mg by mouth daily.        All past medical history, surgical history, allergies, family history, immunizations andmedications were updated in the EMR today and reviewed under the history and medication portions of their EMR.     ROS Negative, with the exception of above mentioned in HPI   Objective:  BP 124/82   Pulse 77   Temp 98.6 F (37 C)   Wt 202 lb (91.6 kg)   LMP 05/19/2023 (Approximate)   SpO2 92%   BMI 35.22 kg/m  Body mass index is 35.22 kg/m. Physical Exam Vitals and nursing note reviewed.  Constitutional:      General: She is not in acute distress.    Appearance: Normal appearance. She is normal weight. She is not ill-appearing  or toxic-appearing.  HENT:     Head: Normocephalic and atraumatic.  Eyes:     General: No scleral icterus.       Right eye: No discharge.        Left eye: No discharge.     Extraocular Movements: Extraocular movements intact.     Conjunctiva/sclera: Conjunctivae normal.     Pupils: Pupils are equal, round, and reactive to light.  Musculoskeletal:     Right knee: Swelling, effusion and bony tenderness present. No erythema. Decreased range of motion. Tenderness present over the lateral joint line, LCL and ACL. ACL laxity present. Abnormal patellar mobility.     Instability Tests: Anterior drawer test positive.     Left knee: Normal.  Skin:    Findings: No rash.  Neurological:     Mental Status: She is alert and oriented to person, place, and time. Mental status is at baseline.     Motor: No weakness.     Coordination: Coordination normal.     Gait: Gait normal.  Psychiatric:        Mood and Affect: Mood normal.        Behavior: Behavior normal.        Thought Content: Thought content normal.        Judgment: Judgment normal.      No results found. No results found. No results found for this or any previous visit (from the past 24 hours).  Assessment/Plan: Emily [name] is a 43 y.o. female present for OV for  Injury of right knee, initial encounter (Primary) Can continue naproxen or ibf for pain (with food) Prednisone taper started today> has tolerated many times in the past without  reaction Referred to ortho urgently, I have concerns she has injured ACL. She has significant effusion today, but significantly tender to touch anterior knee, elected not to drain effusion today and refer urgently.  Pt placed in knee immobilizer and encouraged her to wear all waking hours of day - Ambulatory referral to Orthopedic Surgery - predniSONE (DELTASONE) 20 MG tablet; 60 mg x3d, 40 mg x3d, 20 mg x2d, 10 mg x2d  Dispense: 18 tablet; Refill: 0  Reviewed expectations re: course of  current medical issues. Discussed self-management of symptoms. Outlined signs and symptoms indicating need for more acute intervention. Patient verbalized understanding and all questions were answered. Patient received an After-Visit Summary.    Orders Placed This Encounter  Procedures   Ambulatory referral to Orthopedic Surgery   Meds ordered this encounter  Medications   predniSONE (DELTASONE) 20 MG tablet    Sig: 60 mg x3d, 40 mg x3d, 20 mg x2d, 10 mg x2d    Dispense:  18 tablet    Refill:  0   Referral Orders         Ambulatory referral to Orthopedic Surgery       Note is dictated utilizing voice recognition software. Although note has been proof read prior to signing, occasional typographical errors still can be missed. If any questions arise, please do not hesitate to call for verification.   electronically signed by:  Felix Pacini, DO  [facility] [place]

## 2023-06-17 NOTE — Telephone Encounter (Signed)
 Dr John Giovanni pt

## 2023-06-17 NOTE — Telephone Encounter (Signed)
 UPDATE 06/19/23: Patient called back in today stating she was seen at a different location and they did not offer her an X Ray. Patient requesting to get in to a location today for X Ray. No office availability today. Referred patient to the nearest UC and provided address and hours.  Chief Complaint: Knee injury Symptoms: Knee bruising, swelling, popping Frequency: 2 days ago Pertinent Negatives: Patient denies fever Disposition: [] ED /[] Urgent Care (no appt availability in office) / [x] Appointment(In office/virtual)/ []  [facility]/ [] Home Care/ [] Refused Recommended Disposition /[] [facility]/ []  Follow-up with PCP Additional Notes: Patient called in stating 2 days ago she ran her knee directly into the hard part of a couch and heard/felt a popping sensation and immediately got sick to her stomach. Patient has been having moderate pain in her right knee since then, with bruising and swelling, and cannot bear weight on her leg. Patient appt made for recommended disposition of evaluation within 24 hours. Patient also stating she was trying to come into her PCP office to get labwork done that was suggested by Dr. Chryl Heck with Psychiatry. Patient advised to contact Dr. Rober Minion office to determine source of labwork, as CAL line stated she cannot get outside labwork done at PCP office.    Copied from CRM [id number]. Topic: Clinical - Red Word Triage >> Jun 17, 2023 10:46 AM Gurney Maxin H wrote: [facility] that prompted transfer to Nurse Triage: Hurt knee sore to the touch and painful, hurts to bend it hurts to walk. Reason for Disposition  A "snap" or "pop" was heard at the time of injury  Answer Assessment - Initial Assessment Questions 1. MECHANISM: "How did the injury happen?" (e.g., twisting injury, direct blow)      Ran into hard part of couch with knee 2. ONSET: "When did the injury happen?" (Minutes or hours ago)      Two days ago 3. LOCATION: "Where is the injury located?"       Right knee 4. APPEARANCE of INJURY: "What does the injury look like?"      Bruising and swelling 5. SEVERITY: "Can you put weight on that leg?" "Can you walk?"      Hardly any weight 6. SIZE: For cuts, bruises, or swelling, ask: "How large is it?" (e.g., inches or centimeters;  entire joint)      No open cuts, Yes to bruising, yes to swelling 7. PAIN: "Is there pain?" If Yes, ask: "How bad is the pain?"  "What does it keep you from doing?" (e.g., Scale 1-10; or mild, moderate, severe)   -  NONE: (0): no pain   -  MILD (1-3): doesn't interfere with normal activities    -  MODERATE (4-7): interferes with normal activities (e.g., work or school) or awakens from sleep, limping    -  SEVERE (8-10): excruciating pain, unable to do any normal activities, unable to walk     4 8. TETANUS: For any breaks in the skin, ask: "When was the last tetanus booster?"     N.a 9. OTHER SYMPTOMS: "Do you have any other symptoms?"  (e.g., "pop" when knee injured, swelling, locking, buckling)      Pop when walking, swelling  Protocols used: Knee Injury-A-AH

## 2023-06-17 NOTE — Patient Instructions (Signed)
 Great to see you today.  I have refilled the medication(s) we provide.   If labs were collected or images ordered, we will inform you of  results once we have received them and reviewed. We will contact you either by echart message, or telephone call.  Please give ample time to the testing facility, and our office to run,  receive and review results. Please do not call inquiring of results, even if you can see them in your chart. We will contact you as soon as we are able. If it has been over 1 week since the test was completed, and you have not yet heard from [place], then please call us.    - echart message- for normal results that have been seen by the patient already.   - telephone call: abnormal results or if patient has not viewed results in their echart.  If a referral to a specialist was entered for you, please call us in 2 weeks if you have not heard from the specialist office to schedule.  Anterior Cruciate Ligament Tear  Ligaments are strong bands of tissue that connect bones to each other. The anterior cruciate ligament (ACL) connects one of the lower leg bones (tibia) to the upper leg bone (femur). A tear in this ligament can cause pain and make it hard for you to use your leg. There are three types of ACL injuries: Grade 1. In this type, the ligament is stretched. Grade 2. In this type, the ligament is partially torn. Grade 3. In this type, the ligament is completely torn. What are the causes? This condition happens when too much pressure is put on the ACL, such as: Twisting your knee, especially with your foot planted. Making a quick change in direction. Slowing down quickly while running. Landing a jump without bending your knee. Direct contact or collision. What increases the risk? You are more likely to develop this condition if: You are a female. You play sports that involve jumping or changing directions often. These include football, basketball, soccer,  volleyball, skiing, hockey, or gymnastics. What are the signs or symptoms? Symptoms of this condition include: Noise like a pop or a snap and feeling that your knee is buckling at the time of injury. Pain. Swelling. Being unable to bend your knee as normal. Trouble walking. How is this diagnosed? This condition may be diagnosed based on: Your symptoms. Your medical history. A physical exam. During the exam, your health care provider will test your knee to see if it moves more than it should. This is called laxity. Imaging tests, such as an X-ray or MRI. How is this treated? This condition may be treated by: Resting your knee. Avoiding activities that cause pain, instability, or swelling. Raising your knee, icing the knee, or taking medicine to treat pain and swelling. Not using your leg until pain and swelling improve. You may need to use crutches or a walker. Wearing a compression bandage or wrap to reduce swelling. Doing exercises to improve movement and strength in your knee. Having surgery. This may be needed if you have a complete tear and you are very active. Follow these instructions at home: Managing pain, stiffness, and swelling If told, put ice on your knee. If you have a removable brace, remove it as told by your provider. Put ice in a plastic bag. Place a towel between your skin and the bag. Leave the ice on for 20 minutes, 2-3 times a day. If  your skin turns bright red, remove the ice right away to prevent skin damage. The risk of damage is higher if you can't feel pain, heat, or cold. Move your foot often to reduce stiffness and swelling. Raise your knee above the level of your heart while you are sitting or lying down. Medicines Take over-the-counter and prescription medicines only as told by your provider. Ask your provider if the medicine prescribed to you: Requires you to avoid driving or using machinery. Can cause constipation. You may need to take these  actions to prevent or treat constipation: Drink enough fluid to keep your pee (urine) pale yellow. Take over-the-counter or prescription medicines. Eat foods that are high in fiber, such as beans, whole grains, and fresh fruits and vegetables. Limit foods that are high in fat and processed sugars, such as fried or sweet foods. If you have a brace: Wear the brace as told by your provider. Remove it only as told by your provider. Check the skin around the brace every day. Tell your provider about any concerns. Loosen the brace if your toes tingle, become numb, or turn cold and blue. Keep the brace clean and dry. If the brace is not waterproof: Do not let it get wet. Cover it with a watertight covering when you take a bath or shower. Activity Ask your provider when it's safe to drive if you have a brace on your leg. Do exercises as told by your provider. Return to your normal activities as told by your provider. Ask your provider what activities are safe for you. General instructions Do not use the injured leg until your provider says that you can. Use crutches or a walker as told by your provider. Do not use any products that contain nicotine or tobacco. These products include cigarettes, chewing tobacco, and vaping devices, such as e-cigarettes. These can delay healing. If you need help quitting, ask your provider. How is this prevented? Warm up and stretch before being active. Cool down and stretch after being active. Give your body time to rest between periods of activity. Make sure to use equipment that fits you. If you wear cleats, make sure they are the correct ones for your playing surface. Be safe and responsible while being active. This will help you avoid falls. Maintain physical fitness, including strength and flexibility. Contact a health care provider if: Your symptoms are not getting better or are getting worse. This information is not intended to replace advice given to you  by your health care provider. Make sure you discuss any questions you have with your health care provider. Document Revised: 07/18/2022 Document Reviewed: 07/18/2022 Elsevier Patient Education  2024 [employer].

## 2023-06-18 ENCOUNTER — Encounter: Payer: Self-pay | Admitting: "Endocrinology

## 2023-06-19 ENCOUNTER — Telehealth: Payer: Self-pay

## 2023-06-19 ENCOUNTER — Encounter: Payer: Self-pay | Admitting: Family Medicine

## 2023-06-19 ENCOUNTER — Ambulatory Visit: Payer: Self-pay | Admitting: Family Medicine

## 2023-06-19 DIAGNOSIS — M25561 Pain in right knee: Secondary | ICD-10-CM | POA: Diagnosis not present

## 2023-06-19 DIAGNOSIS — S8391XA Sprain of unspecified site of right knee, initial encounter: Secondary | ICD-10-CM | POA: Diagnosis not present

## 2023-06-19 NOTE — Telephone Encounter (Signed)
 Sample  Medication:Synjardy  Dose:  12.08-998 mg Quantity:4 boxes ZOX:[id number] EXP:11/18/25  Dicie Beam

## 2023-06-19 NOTE — Telephone Encounter (Signed)
 Pt needs a PA for [employer]

## 2023-06-19 NOTE — Telephone Encounter (Signed)
 Copied from CRM [id number]. Topic: Clinical - Red Word Triage >> Jun 19, 2023 11:39 AM Louie Boston wrote: Red Word that prompted transfer to Nurse Triage: Swelling in right Knee

## 2023-06-22 ENCOUNTER — Other Ambulatory Visit (HOSPITAL_COMMUNITY): Payer: Self-pay

## 2023-06-22 ENCOUNTER — Telehealth: Payer: Self-pay

## 2023-06-22 NOTE — Telephone Encounter (Signed)
 Pharmacy Patient Advocate Encounter   Received notification from Pt Calls Messages that prior authorization for Emily [name] is required/requested.   Insurance verification completed.   The patient is insured through [facility] .   Per test claim: PA required; PA submitted to above mentioned insurance via CoverMyMeds Key/confirmation #/EOC [id number] Status is pending   PA has been APPROVED through 06/20/24 PA Case ID #: [id number]

## 2023-06-23 ENCOUNTER — Other Ambulatory Visit: Payer: Self-pay | Admitting: Family Medicine

## 2023-06-23 DIAGNOSIS — Z6835 Body mass index (BMI) 35.0-35.9, adult: Secondary | ICD-10-CM | POA: Diagnosis not present

## 2023-06-23 DIAGNOSIS — E039 Hypothyroidism, unspecified: Secondary | ICD-10-CM

## 2023-06-23 DIAGNOSIS — E66812 Obesity, class 2: Secondary | ICD-10-CM | POA: Diagnosis not present

## 2023-06-23 DIAGNOSIS — Z713 Dietary counseling and surveillance: Secondary | ICD-10-CM | POA: Diagnosis not present

## 2023-06-24 ENCOUNTER — Telehealth: Admitting: Physician Assistant

## 2023-06-24 DIAGNOSIS — Z5181 Encounter for therapeutic drug level monitoring: Secondary | ICD-10-CM | POA: Diagnosis not present

## 2023-06-24 DIAGNOSIS — J9801 Acute bronchospasm: Secondary | ICD-10-CM

## 2023-06-24 MED ORDER — BENZONATATE 100 MG PO CAPS: 100.0000 mg | ORAL_CAPSULE | Freq: Three times a day (TID) | ORAL | 0 refills | Status: DC | PRN

## 2023-06-24 NOTE — Progress Notes (Signed)
 Virtual Visit Consent   Emily [name], you are scheduled for a virtual visit with a [facility] provider today. Just as with appointments in the office, your consent must be obtained to participate. Your consent will be active for this visit and any virtual visit you may have with one of our providers in the next 365 days. If you have a MyChart account, a copy of this consent can be sent to you electronically.  As this is a virtual visit, video technology does not allow for your provider to perform a traditional examination. This may limit your provider's ability to fully assess your condition. If your provider identifies any concerns that need to be evaluated in person or the need to arrange testing (such as labs, EKG, etc.), we will make arrangements to do so. Although advances in technology are sophisticated, we cannot ensure that it will always work on either your end or our end. If the connection with a video visit is poor, the visit may have to be switched to a telephone visit. With either a video or telephone visit, we are not always able to ensure that we have a secure connection.  By engaging in this virtual visit, you consent to the provision of healthcare and authorize for your insurance to be billed (if applicable) for the services provided during this visit. Depending on your insurance coverage, you may receive a charge related to this service.  I need to obtain your verbal consent now. Are you willing to proceed with your visit today? Emily [name] has provided verbal consent on 06/24/2023 for a virtual visit (video or telephone). Emily [name], [place]  Date: 06/24/2023 2:53 PM   Virtual Visit via Video Note   I, Emily [name], connected with  Emily [name]  ([id number], June 15, 1980) on 06/24/23 at  2:45 PM EST by a video-enabled telemedicine application and verified that I am speaking with the correct person using two identifiers.  Location: Patient: Virtual Visit  Location Patient: Home Provider: Virtual Visit Location Provider: Home Office   I discussed the limitations of evaluation and management by telemedicine and the availability of in person appointments. The patient expressed understanding and agreed to proceed.    History of Present Illness: Emily [name] is a 43 y.o. who identifies as a female who was assigned female at birth, and is being seen today for around 2 weeks of residual cough and voice hoarseness after a URI. Cough is mainly dry, occasionally productive at night. Notes some associated PND. Denies sinus pressure or nasal congestion. Denies chest tightness or wheezing. Denies fevers, chills. Occasional scratchy throat.   OTC -- Delsym and Robitussin  HPI: HPI  Problems:  Patient Active Problem List   Diagnosis Date Noted   PTSD (post-traumatic stress disorder) 06/15/2023   Generalized anxiety disorder 06/15/2023   Panic disorder with agoraphobia 06/15/2023   Major depressive disorder, 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/2023   Bulimia nervosa 06/15/2023   Borderline personality disorder (HCC) 06/15/2023   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 06/15/2023   Chronic prescription benzodiazepine use 06/15/2023   History of alcohol use disorder in sustained remission 06/15/2023   Polypharmacy 06/15/2023   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   Caffeine-induced insomnia with snoring 06/15/2023   Irregular bleeding 06/02/2023   Pelvic cramping 06/02/2023   Urinary frequency 06/02/2023   Pregnancy examination or test, negative result 06/02/2023   Missed periods 06/02/2023   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   Sebaceous cyst of labia 02/03/2023  Cyst of left ovary 08/11/2022   ASCUS with positive high risk HPV cervical 08/11/2022   [place]y 07/30/2022   Hot flashes 07/30/2022   Friable cervix 07/30/2022   Spotting 07/30/2022   Amenorrhea, secondary 07/30/2022   Bartholin cyst 04/30/2022   Irregular  periods 04/30/2022   Dental caries 03/11/2022   Tick bite of back 12/20/2020   Chronic bronchitis (HCC) 07/09/2020   Breast tenderness 09/20/2018   Gastroesophageal reflux disease 09/16/2018   Shortness of breath 08/05/2018   Abnormal finding on lung imaging 08/05/2018   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   Vaginal odor 09/30/2017   BV (bacterial vaginosis) 09/30/2017   Swollen lymph nodes 01/06/2017   Vaginal irritation 10/08/2016   RUQ pain 10/08/2016   LLQ pain 10/08/2016   Vaginal discharge 10/08/2016   Menstrual period late 10/08/2016   Cyst of right Bartholin's gland 12/17/2015   Recurrent boils 06/18/2015   Type 2 diabetes mellitus with complication, without long-term current use of insulin (HCC) 04/18/2015   Ingrowing toenail 04/18/2015   Vitamin D deficiency 04/18/2015   Angular cheilitis 04/18/2015   Dyslipidemia 04/18/2015   Diabetes mellitus with complication in adult patient (HCC) 02/20/2015   Smoker 02/20/2015   Essential hypertension 02/20/2015   Pain in limb 02/20/2015   Myalgia 02/20/2015   Hypothyroidism 02/20/2015   Leg pain, inferior 02/20/2015   Toenail deformity 02/20/2015   Elevated liver enzymes 01/07/2012   Abnormal CT of liver 09/30/2011   Fatty liver 09/30/2011   G E REFLUX 11/30/2007   CHEST PAIN-UNSPECIFIED 11/30/2007   HEADACHE 10/21/2007   DYSPNEA 09/15/2007   UNSPECIFIED TACHYCARDIA 06/01/2007   COUGH 05/24/2007   URINARY INCONTINENCE 05/24/2007   TOBACCO ABUSE 05/20/2007   Pulmonary nodules 05/12/2007   DEPRESSION 04/19/2007   Asthma 04/19/2007    Allergies:  Allergies  Allergen Reactions   Bactrim [Sulfamethoxazole-Trimethoprim] Anaphylaxis and Other (See Comments)    Chest pain, trouble breathing   Ciprofloxacin Palpitations   Clindamycin/Lincomycin     Other reaction(s): Chest Pain   Flagyl [Metronidazole] Shortness Of Breath   Hydrocodone Other (See Comments)   Iodinated Contrast Media Swelling   Ondansetron Shortness Of  Breath   Liraglutide Nausea And Vomiting   Doxycycline Nausea And Vomiting   Penicillins    Medications:  Current Outpatient Medications:    benzonatate (TESSALON) 100 MG capsule, Take 1 capsule (100 mg total) by mouth 3 (three) times daily as needed for cough., Disp: 30 capsule, Rfl: 0   ACCU-CHEK AVIVA PLUS test strip, 1 each by Other route 3 (three) times daily., Disp: , Rfl:    ACCU-CHEK FASTCLIX LANCETS MISC, USE AS DIRECTED, Disp: 102 each, Rfl: 3   albuterol (VENTOLIN HFA) 108 (90 Base) MCG/ACT inhaler, Inhale 2 puffs into the lungs every 6 (six) hours as needed for wheezing or shortness of breath., Disp: 18 g, Rfl: 1   amLODipine (NORVASC) 5 MG tablet, TAKE 1 TABLET (5 MG TOTAL) BY MOUTH DAILY., Disp: 90 tablet, Rfl: 3   Blood Glucose Monitoring Suppl (ACCU-CHEK AVIVA PLUS) w/Device KIT, USE AS DIRECTED, Disp: 1 kit, Rfl: 0   Blood Glucose Monitoring Suppl DEVI, 1 each by Does not apply route in the morning, at noon, and at bedtime. May substitute to any manufacturer covered by patient's insurance., Disp: 1 each, Rfl: 0   clonazePAM (KLONOPIN) 0.5 MG disintegrating tablet, Take 0.5 mg by mouth daily as needed., Disp: , Rfl:    Continuous Glucose Receiver (DEXCOM G7 RECEIVER) DEVI, 1 Device by Does not apply  route continuous., Disp: 1 each, Rfl: 0   Continuous Glucose Sensor (DEXCOM G7 SENSOR) MISC, 1 Device by Does not apply route continuous., Disp: 9 each, Rfl: 0   Empagliflozin-metFORMIN HCl ER (SYNJARDY XR) 12.08-998 MG TB24, Take 2 tablets by mouth daily., Disp: 60 tablet, Rfl: 5   fluticasone-salmeterol (ADVAIR) 250-50 MCG/ACT AEPB, Inhale 1 puff into the lungs every 12 (twelve) hours. Rinse after use., Disp: 60 each, Rfl: 5   INCRUSE ELLIPTA 62.5 MCG/ACT AEPB, daily as needed., Disp: , Rfl:    ipratropium-albuterol (DUONEB) 0.5-2.5 (3) MG/3ML SOLN, Take 3 mLs by nebulization in the morning, at noon, and at bedtime., Disp: 270 mL, Rfl: 11   lamoTRIgine (LAMICTAL) 150 MG tablet, Take  150 mg by mouth daily., Disp: , Rfl:    levothyroxine (SYNTHROID) 75 MCG tablet, TAKE 1 TABLET BY MOUTH DAILY BEFORE BREAKFAST., Disp: 90 tablet, Rfl: 0   montelukast (SINGULAIR) 10 MG tablet, Take 1 tablet (10 mg total) by mouth at bedtime., Disp: 90 tablet, Rfl: 3   naproxen (NAPROSYN) 500 MG tablet, Take 1 tablet (500 mg total) by mouth 2 (two) times daily with a meal., Disp: 30 tablet, Rfl: 0   Nebulizers (COMPRESSOR/NEBULIZER) MISC, Use as directed, Disp: 1 each, Rfl: 0   nitroGLYCERIN (NITROSTAT) 0.4 MG SL tablet, Place 1 tablet (0.4 mg total) under the tongue every 5 (five) minutes as needed for chest pain., Disp: 25 tablet, Rfl: 3   omeprazole (PRILOSEC) 40 MG capsule, Take 40 mg by mouth every morning., Disp: , Rfl:    OVER THE COUNTER MEDICATION, Take by mouth as needed. Maalox lidocain 2% takes 15-30 mls, Disp: , Rfl:    PARoxetine (PAXIL) 10 MG tablet, Take 10 mg by mouth at bedtime., Disp: , Rfl:    predniSONE (DELTASONE) 20 MG tablet, 60 mg x3d, 40 mg x3d, 20 mg x2d, 10 mg x2d, Disp: 18 tablet, Rfl: 0   promethazine (PHENERGAN) 12.5 MG tablet, Take 12.5 mg by mouth every 8 (eight) hours as needed., Disp: , Rfl:    propranolol (INDERAL) 20 MG tablet, Take 1 tablet (20 mg total) by mouth 3 (three) times daily., Disp: 270 tablet, Rfl: 3   pseudoephedrine (SUDAFED) 120 MG 12 hr tablet, Take 1 tablet (120 mg total) by mouth 2 (two) times daily., Disp: 20 tablet, Rfl: 0   Respiratory Therapy Supplies (NEBULIZER/TUBING/MOUTHPIECE) KIT, Use as directed, Disp: 10 kit, Rfl: 11   ziprasidone (GEODON) 60 MG capsule, Take 60 mg by mouth daily., Disp: , Rfl:   Observations/Objective: Patient is well-developed, well-nourished in no acute distress.  Resting comfortably at home.  Head is normocephalic, atraumatic.  No labored breathing. Speech is clear and coherent with logical content.  Patient is alert and oriented at baseline.   Assessment and Plan: 1. Bronchospasm (Primary) - benzonatate  (TESSALON) 100 MG capsule; Take 1 capsule (100 mg total) by mouth 3 (three) times daily as needed for cough.  Dispense: 30 capsule; Refill: 0  Post-viral. Will have her restart her albuterol. Tessalon per orders. Continue maintenance asthma medications. Follow-up in person if not resolving over the rest of this week.  Follow Up Instructions: I discussed the assessment and treatment plan with the patient. The patient was provided an opportunity to ask questions and all were answered. The patient agreed with the plan and demonstrated an understanding of the instructions.  A copy of instructions were sent to the patient via MyChart unless otherwise noted below.  The patient was advised to call back or  seek an in-person evaluation if the symptoms worsen or if the condition fails to improve as anticipated.    Emily Climes, PA-C

## 2023-06-24 NOTE — Patient Instructions (Signed)
 Emily [name], thank you for joining [name], PA-C for today's virtual visit.  While this provider is not your primary care provider (PCP), if your PCP is located in our provider database this encounter information will be shared with them immediately following your visit.   A [facility] MyChart account gives you access to today's visit and all your visits, tests, and labs performed at [facility] " click here if you don't have a Emily [name] MyChart account or go to [email or web address]  Consent: (Patient) Emily [name] provided verbal consent for this virtual visit at the beginning of the encounter.  Current Medications:  Current Outpatient Medications:    benzonatate (TESSALON) 100 MG capsule, Take 1 capsule (100 mg total) by mouth 3 (three) times daily as needed for cough., Disp: 30 capsule, Rfl: 0   ACCU-CHEK AVIVA PLUS test strip, 1 each by Other route 3 (three) times daily., Disp: , Rfl:    ACCU-CHEK FASTCLIX LANCETS MISC, USE AS DIRECTED, Disp: 102 each, Rfl: 3   albuterol (VENTOLIN HFA) 108 (90 Base) MCG/ACT inhaler, Inhale 2 puffs into the lungs every 6 (six) hours as needed for wheezing or shortness of breath., Disp: 18 g, Rfl: 1   amLODipine (NORVASC) 5 MG tablet, TAKE 1 TABLET (5 MG TOTAL) BY MOUTH DAILY., Disp: 90 tablet, Rfl: 3   Blood Glucose Monitoring Suppl (ACCU-CHEK AVIVA PLUS) w/Device KIT, USE AS DIRECTED, Disp: 1 kit, Rfl: 0   Blood Glucose Monitoring Suppl DEVI, 1 each by Does not apply route in the morning, at noon, and at bedtime. May substitute to any manufacturer covered by patient's insurance., Disp: 1 each, Rfl: 0   clonazePAM (KLONOPIN) 0.5 MG disintegrating tablet, Take 0.5 mg by mouth daily as needed., Disp: , Rfl:    Continuous Glucose Receiver (DEXCOM G7 RECEIVER) DEVI, 1 Device by Does not apply route continuous., Disp: 1 each, Rfl: 0   Continuous Glucose Sensor (DEXCOM G7 SENSOR) MISC, 1 Device by Does not apply route  continuous., Disp: 9 each, Rfl: 0   Empagliflozin-metFORMIN HCl ER (SYNJARDY XR) 12.08-998 MG TB24, Take 2 tablets by mouth daily., Disp: 60 tablet, Rfl: 5   fluticasone-salmeterol (ADVAIR) 250-50 MCG/ACT AEPB, Inhale 1 puff into the lungs every 12 (twelve) hours. Rinse after use., Disp: 60 each, Rfl: 5   INCRUSE ELLIPTA 62.5 MCG/ACT AEPB, daily as needed., Disp: , Rfl:    ipratropium-albuterol (DUONEB) 0.5-2.5 (3) MG/3ML SOLN, Take 3 mLs by nebulization in the morning, at noon, and at bedtime., Disp: 270 mL, Rfl: 11   lamoTRIgine (LAMICTAL) 150 MG tablet, Take 150 mg by mouth daily., Disp: , Rfl:    levothyroxine (SYNTHROID) 75 MCG tablet, TAKE 1 TABLET BY MOUTH DAILY BEFORE BREAKFAST., Disp: 90 tablet, Rfl: 0   montelukast (SINGULAIR) 10 MG tablet, Take 1 tablet (10 mg total) by mouth at bedtime., Disp: 90 tablet, Rfl: 3   naproxen (NAPROSYN) 500 MG tablet, Take 1 tablet (500 mg total) by mouth 2 (two) times daily with a meal., Disp: 30 tablet, Rfl: 0   Nebulizers (COMPRESSOR/NEBULIZER) MISC, Use as directed, Disp: 1 each, Rfl: 0   nitroGLYCERIN (NITROSTAT) 0.4 MG SL tablet, Place 1 tablet (0.4 mg total) under the tongue every 5 (five) minutes as needed for chest pain., Disp: 25 tablet, Rfl: 3   omeprazole (PRILOSEC) 40 MG capsule, Take 40 mg by mouth every morning., Disp: , Rfl:    OVER THE COUNTER MEDICATION, Take by mouth as needed. Maalox lidocain 2% takes 15-30  mls, Disp: , Rfl:    PARoxetine (PAXIL) 10 MG tablet, Take 10 mg by mouth at bedtime., Disp: , Rfl:    predniSONE (DELTASONE) 20 MG tablet, 60 mg x3d, 40 mg x3d, 20 mg x2d, 10 mg x2d, Disp: 18 tablet, Rfl: 0   promethazine (PHENERGAN) 12.5 MG tablet, Take 12.5 mg by mouth every 8 (eight) hours as needed., Disp: , Rfl:    propranolol (INDERAL) 20 MG tablet, Take 1 tablet (20 mg total) by mouth 3 (three) times daily., Disp: 270 tablet, Rfl: 3   pseudoephedrine (SUDAFED) 120 MG 12 hr tablet, Take 1 tablet (120 mg total) by mouth 2 (two)  times daily., Disp: 20 tablet, Rfl: 0   Respiratory Therapy Supplies (NEBULIZER/TUBING/MOUTHPIECE) KIT, Use as directed, Disp: 10 kit, Rfl: 11   ziprasidone (GEODON) 60 MG capsule, Take 60 mg by mouth daily., Disp: , Rfl:    Medications ordered in this encounter:  Meds ordered this encounter  Medications   benzonatate (TESSALON) 100 MG capsule    Sig: Take 1 capsule (100 mg total) by mouth 3 (three) times daily as needed for cough.    Dispense:  30 capsule    Refill:  0    Supervising Provider:   Merrilee Jansky [[id number]]     *If you need refills on other medications prior to your next appointment, please contact your pharmacy*  Follow-Up: Call back or seek an in-person evaluation if the symptoms worsen or if the condition fails to improve as anticipated.  [facility] [phone]  Other Instructions Please continue your regular medications, restarting use of the albuterol as dicussed. Add on the prescription cough medication to your use of the OTC cough syrups. If not improving or any new/worsening symptoms, please contact your PCP office.   If you have been instructed to have an in-person evaluation today at a local Urgent Care facility, please use the link below. It will take you to a list of all of our available [facility], including address, phone number and hours of operation. Please do not delay care.  [facility]  If you or a family member do not have a primary care provider, use the link below to schedule a visit and establish care. When you choose a [facility] primary care physician or advanced practice provider, you gain a long-term partner in health. Find a Primary Care Provider  Learn more about [facility] in-office and virtual care options:  - Get Care Now

## 2023-06-25 ENCOUNTER — Ambulatory Visit (HOSPITAL_BASED_OUTPATIENT_CLINIC_OR_DEPARTMENT_OTHER): Payer: Self-pay | Admitting: Internal Medicine

## 2023-06-25 ENCOUNTER — Encounter (INDEPENDENT_AMBULATORY_CARE_PROVIDER_SITE_OTHER): Admitting: Family Medicine

## 2023-06-25 DIAGNOSIS — Z03818 Encounter for observation for suspected exposure to other biological agents ruled out: Secondary | ICD-10-CM | POA: Diagnosis not present

## 2023-06-25 DIAGNOSIS — R051 Acute cough: Secondary | ICD-10-CM | POA: Diagnosis not present

## 2023-06-25 DIAGNOSIS — J020 Streptococcal pharyngitis: Secondary | ICD-10-CM | POA: Diagnosis not present

## 2023-06-25 NOTE — Progress Notes (Signed)
 error

## 2023-06-25 NOTE — Telephone Encounter (Signed)
 Call from [employer], spoke to [place], office reporting pt experiencing "cough, hoarse, and mucus." Speaking to pt.  TRIAGE SUMMARY NOTE: Pt reporting hoarseness and productive cough to "a little green" and some green nasal discharge. Pt confirms no fever, sore throat, chest/nasal congestion, SOB more than normal. Advised pt be examined in next 3 days for symptoms. Connected to front desk at [employer], office scheduled MyChart virtual visit for pt tomorrow. Advised pt call for earlier appt with PCP if needed, rest her voice, avoid tobacco smoke, use humidifier, drink warm fluids, and call back if worsening. Pt verbalized understanding.  E2C2 Pulmonary Triage - Initial Assessment Questions "Chief Complaint (e.g., cough, sob, wheezing, fever, chills, sweat or additional symptoms) *Go to specific symptom protocol after initial questions. So hoarse for a week or so not getting any better Coughing up things in throat, clear, a little green When blow nose it's a little green but not a lot of it Not been coughing a lot but hoarseness so bad No fever, no aches No SOB more than normal, no chest pain No dizziness No congestion in chest or head, don't know what it is No sore throat  "How long have symptoms been present?" About a week  Have you tested for COVID or Flu? Note: If not, ask patient if a home test can be taken. If so, instruct patient to call back for positive results. No  MEDICINES:   "Have you used any OTC meds to help with symptoms?"  No but prescribed If yes, ask "What medications?" [facility] gave tesslon pearl Tried some delsum, nothing's helped the cough, tesslon pearl helps but makes me feel funny like makes my head feel funny   Reason for Disposition  [1] Hoarseness AND [2] risk factor (e.g., recent neck surgery or intubation, smoker, unexpected weight loss)  (Exception: Current common cold or mild URI symptoms.)  Answer Assessment - Initial  Assessment Questions 1. DESCRIPTION: "Describe your voice." (e.g., coarse, raspy, weaker, airy, scratchy, deeper)     Don't hurt or anything just rusty, not right 2. SEVERITY: "How bad is it?"   - MILD: doesn't interfere with normal activities   - MODERATE: interferes with normal activities such as school or work   - SEVERE: only able to whisper.     Difficult to speak 6. ALLERGIES: "Any allergy symptoms?" If Yes, ask: "What are they?"     no 7. IRRITANTS: "Do you smoke?" "Have you been exposed to any irritating fumes?" (e.g., smoke)     yes 8. CAUSE: "What do you think is causing the hoarseness?"     unknown 9. OTHER SYMPTOMS: "Do you have any other symptoms?" (e.g., breathing difficulty, fever, foreign body, lymph node swelling in neck, rash, sore throat, weight loss)     denies 10. PREGNANCY: "Is there any chance you are pregnant?" "When was your last menstrual period?"       no  Protocols used: [facility]

## 2023-06-26 ENCOUNTER — Encounter: Payer: Self-pay | Admitting: Internal Medicine

## 2023-06-26 ENCOUNTER — Ambulatory Visit (INDEPENDENT_AMBULATORY_CARE_PROVIDER_SITE_OTHER)
Admission: RE | Admit: 2023-06-26 | Discharge: 2023-06-26 | Disposition: A | Discharge status: Home / Self Care | Source: Ambulatory Visit | Attending: Primary Care | Admitting: Primary Care

## 2023-06-26 ENCOUNTER — Telehealth: Admitting: Primary Care

## 2023-06-26 DIAGNOSIS — F17210 Nicotine dependence, cigarettes, uncomplicated: Secondary | ICD-10-CM | POA: Diagnosis not present

## 2023-06-26 DIAGNOSIS — J209 Acute bronchitis, unspecified: Secondary | ICD-10-CM

## 2023-06-26 DIAGNOSIS — J45901 Unspecified asthma with (acute) exacerbation: Secondary | ICD-10-CM

## 2023-06-26 DIAGNOSIS — R059 Cough, unspecified: Secondary | ICD-10-CM | POA: Diagnosis not present

## 2023-06-26 DIAGNOSIS — J020 Streptococcal pharyngitis: Secondary | ICD-10-CM | POA: Diagnosis not present

## 2023-06-26 DIAGNOSIS — R0989 Other specified symptoms and signs involving the circulatory and respiratory systems: Secondary | ICD-10-CM | POA: Diagnosis not present

## 2023-06-26 DIAGNOSIS — F17200 Nicotine dependence, unspecified, uncomplicated: Secondary | ICD-10-CM | POA: Diagnosis not present

## 2023-06-26 NOTE — Progress Notes (Signed)
 Virtual Visit via Video Note  I connected with Emily [name] on 06/26/23 at  2:00 PM EST by a video enabled telemedicine application and verified that I am speaking with the correct person using two identifiers.  Location: Patient: Home Provider: Office   I discussed the limitations of evaluation and management by telemedicine and the availability of in person appointments. The patient expressed understanding and agreed to proceed.  History of Present Illness: 43 year old female, current smoker. PMH significant for asthma, cardiomegaly, lung nodules, liver cirrhosis, tobacco abuse. Patient of Dr. Marchelle Gearing, last seen on 12/12/22  06/26/2023 Discussed the use of AI scribe software for clinical note transcription with the patient, who gave verbal consent to proceed.  History of Present Illness   The patient, with asthma, presents with a cough and concern for pneumonia.   She has been experiencing symptoms for about two weeks, including a cough and production of sputum. She visited urgent care yesterday and was diagnosed with streptococcal pharyngitis after a swab test confirmed the presence of the infection. She was prescribed a Z-Pak, prednisone, and promethazine DM cough syrup. The prednisone is a six-day taper starting with 60 mg on the first day. She has not had a fever in the last 24 hours and has been using expectorants like Robitussin but avoids Mucinex as it 'messes with her head'.  She has a history of pneumonia and is concerned about the current symptoms potentially developing into pneumonia. She has a history of asthma and is currently taking Advair one puff twice a day, Incruse Ellipta once daily, and Singulair 10 mg at bedtime. She has a nebulizer available and has used it for extra breathing treatments due to mild shortness of breath associated with the cough.  A COVID and flu swab test were negative.       Observations/Objective:  Appears well without overt respiratory  symptoms except for occasional cough; she is able to speak in full sentences   Assessment and Plan:  1. Acute bronchitis, unspecified organism (Primary) - DG Chest 2 View; Future  2. Strep throat     Strep Throat Confirmed by swab at urgent care. Currently on Z-Pak, prednisone, and promethazine DM. No fever in the last 24 hours. -Continue current medications as prescribed by urgent care. -Perform salt water gargles for symptomatic relief.  Asthmatic bronchitis with acute exacerbation  Concern for possible pneumonia due to productive cough and history of pneumonia. No significant shortness of breath. Currently on Z-Pak, prednisone, and promethazine DM.  -Continue Advair, Incruse Ellipta, Singulair, and using nebulizer as needed. -Order chest x-[facility] at [facility] to rule out pneumonia. -Continue current asthma medications as directed. -Use nebulizer every six hours for the first couple of days.  Follow Up Instructions:  Follow-up Await chest x-ray results via MyChart.       I discussed the assessment and treatment plan with the patient. The patient was provided an opportunity to ask questions and all were answered. The patient agreed with the plan and demonstrated an understanding of the instructions.   The patient was advised to call back or seek an in-person evaluation if the symptoms worsen or if the condition fails to improve as anticipated.  I provided 22 minutes of non-face-to-face time during this encounter.   Glenford Bayley, NP

## 2023-06-26 NOTE — Patient Instructions (Signed)
-  STREP THROAT: Strep throat is a bacterial infection that causes a sore, scratchy throat. You are currently taking a Z-Pak, prednisone, and promethazine DM as prescribed by urgent care. Please continue these medications and perform salt water gargles for relief.  -ASTHMA: Asthma is a condition where your airways narrow and swell, making it difficult to breathe. Given your productive cough and history of pneumonia, we are ordering a chest x-ray to rule out pneumonia. Continue taking your asthma medications as directed and use your nebulizer every six hours for the first couple of days.  INSTRUCTIONS:  Please await the chest x-ray results via MyChart.

## 2023-06-29 ENCOUNTER — Encounter: Payer: Self-pay | Admitting: "Endocrinology

## 2023-06-29 ENCOUNTER — Telehealth: Payer: Self-pay

## 2023-06-29 NOTE — Telephone Encounter (Signed)
 Called pt and informed her of Dr instruction with Meds.

## 2023-06-29 NOTE — Progress Notes (Signed)
Please let patient know CXR was normal

## 2023-07-02 ENCOUNTER — Other Ambulatory Visit: Payer: Self-pay | Admitting: Adult Health

## 2023-07-02 ENCOUNTER — Telehealth: Payer: Self-pay

## 2023-07-02 DIAGNOSIS — K6289 Other specified diseases of anus and rectum: Secondary | ICD-10-CM | POA: Diagnosis not present

## 2023-07-02 DIAGNOSIS — B3731 Acute candidiasis of vulva and vagina: Secondary | ICD-10-CM | POA: Diagnosis not present

## 2023-07-02 DIAGNOSIS — K602 Anal fissure, unspecified: Secondary | ICD-10-CM | POA: Diagnosis not present

## 2023-07-02 DIAGNOSIS — K625 Hemorrhage of anus and rectum: Secondary | ICD-10-CM | POA: Diagnosis not present

## 2023-07-02 MED ORDER — SOLIFENACIN SUCCINATE 5 MG PO TABS: 5.0000 mg | ORAL_TABLET | Freq: Every day | ORAL | 2 refills | Status: DC

## 2023-07-02 NOTE — Telephone Encounter (Signed)
 Spoke to pt regarding stopping Synjardy, transferred pt to front desk to help schedule appt.

## 2023-07-02 NOTE — Progress Notes (Unsigned)
Rx vesicare 

## 2023-07-02 NOTE — Telephone Encounter (Signed)
 Pt reports vaginal itching possibly for starting Synjardy. Pt also reports BS is elevated 500.

## 2023-07-03 MED ORDER — INSULIN PEN NEEDLE 32G X 4 MM MISC: 1.0000 | Freq: Four times a day (QID) | 3 refills | Status: DC

## 2023-07-03 MED ORDER — INSULIN LISPRO (1 UNIT DIAL) 100 UNIT/ML (KWIKPEN): PEN_INJECTOR | SUBCUTANEOUS | 4 refills | Status: DC

## 2023-07-03 NOTE — Telephone Encounter (Signed)
 Attempted to call pt, pt didn't answer Lvm and sent a message via my chart with detail of new Rx and instruction on how to take medication.

## 2023-07-03 NOTE — Addendum Note (Signed)
 Addended by: Scarlette Shorts on: 07/03/2023 03:45 PM   Modules accepted: Orders

## 2023-07-03 NOTE — Telephone Encounter (Signed)
 See Telephone Encounter dated 07/02/23. Closing this encounter.

## 2023-07-04 DIAGNOSIS — M25562 Pain in left knee: Secondary | ICD-10-CM | POA: Diagnosis not present

## 2023-07-06 ENCOUNTER — Emergency Department (HOSPITAL_COMMUNITY)
Admission: EM | Admit: 2023-07-06 | Discharge: 2023-07-06 | Disposition: A | Discharge status: Home / Self Care | Attending: Emergency Medicine | Admitting: Emergency Medicine

## 2023-07-06 ENCOUNTER — Other Ambulatory Visit: Payer: Self-pay

## 2023-07-06 DIAGNOSIS — Z79899 Other long term (current) drug therapy: Secondary | ICD-10-CM | POA: Diagnosis not present

## 2023-07-06 DIAGNOSIS — R739 Hyperglycemia, unspecified: Secondary | ICD-10-CM | POA: Diagnosis not present

## 2023-07-06 DIAGNOSIS — E1165 Type 2 diabetes mellitus with hyperglycemia: Secondary | ICD-10-CM | POA: Diagnosis not present

## 2023-07-06 DIAGNOSIS — B379 Candidiasis, unspecified: Secondary | ICD-10-CM | POA: Diagnosis not present

## 2023-07-06 DIAGNOSIS — N939 Abnormal uterine and vaginal bleeding, unspecified: Secondary | ICD-10-CM | POA: Diagnosis not present

## 2023-07-06 DIAGNOSIS — R748 Abnormal levels of other serum enzymes: Secondary | ICD-10-CM | POA: Diagnosis not present

## 2023-07-06 DIAGNOSIS — F17210 Nicotine dependence, cigarettes, uncomplicated: Secondary | ICD-10-CM | POA: Diagnosis not present

## 2023-07-06 DIAGNOSIS — E119 Type 2 diabetes mellitus without complications: Secondary | ICD-10-CM | POA: Diagnosis not present

## 2023-07-06 DIAGNOSIS — K921 Melena: Secondary | ICD-10-CM | POA: Diagnosis not present

## 2023-07-06 LAB — URINALYSIS, ROUTINE W REFLEX MICROSCOPIC
Bacteria, UA: NONE SEEN
Bilirubin Urine: NEGATIVE
Glucose, UA: >=500 mg/dL — AB
Ketones, ur: NEGATIVE mg/dL
Nitrite: NEGATIVE
Protein, ur: NEGATIVE mg/dL
RBC / HPF: >50 RBC/hpf (ref 0–5)
Specific Gravity, Urine: 1.022 (ref 1.005–1.030)
pH: 6 (ref 5.0–8.0)

## 2023-07-06 LAB — CBC
HCT: 44.4 % (ref 36.0–46.0)
Hemoglobin: 15.1 g/dL — ABNORMAL HIGH (ref 12.0–15.0)
MCH: 33.3 pg (ref 26.0–34.0)
MCHC: 34 g/dL (ref 30.0–36.0)
MCV: 98 fL (ref 80.0–100.0)
Platelets: 208 10*3/uL (ref 150–400)
RBC: 4.53 MIL/uL (ref 3.87–5.11)
RDW: 11.9 % (ref 11.5–15.5)
WBC: 10.1 10*3/uL (ref 4.0–10.5)
nRBC: 0 % (ref 0.0–0.2)

## 2023-07-06 MED ORDER — NYSTATIN 100000 UNIT/GM EX CREA: TOPICAL_CREAM | CUTANEOUS | 0 refills | Status: DC

## 2023-07-06 NOTE — ED Triage Notes (Signed)
 Pt states she has had abnormal vaginal bleeding x3 days. Reports passing "golf ball size" clots and saturating pads every 1-2 hours.

## 2023-07-06 NOTE — Discharge Instructions (Signed)
 Thankfully your hemoglobin level was normal, there was no signs of hemorrhage, no signs of instability, I want you to add the nystatin ointment to the skin where you have the rash in your groin in addition to the fluconazole.  Your gynecologist can do further evaluation for the cause of your bleeding as it is likely related to your underlying hormone levels

## 2023-07-06 NOTE — ED Provider Notes (Signed)
 [place] EMERGENCY DEPARTMENT AT [facility] Provider Note   CSN: [id number] Arrival date & time: 07/06/23  2015     History  Chief Complaint  Patient presents with   Vaginal Bleeding    Emily [name] is a 43 y.o. female.   Vaginal Bleeding  43 year old female presenting with vaginal bleeding for the last several days, she just had her menstrual cycle that lasted for a month and ended at the beginning of March.  It is now 2 weeks later she started to have bleeding again including large clots.  She is a diabetic, she is not pregnant, has not had sexual intercourse in 2 years, denies any trauma, she is not coagulopathic and takes no blood thinners.  She does have a history of some cirrhosis of the liver but does not take any medicines, is not having easy bruising, her gums do not bleed, no other bleeding issues.    Home Medications Prior to Admission medications   Medication Sig Start Date End Date Taking? Authorizing Provider  nystatin cream (MYCOSTATIN) Apply to affected area 2 times daily 07/06/23  Yes Eber Hong, MD  solifenacin (VESICARE) 5 MG tablet Take 1 tablet (5 mg total) by mouth daily. 07/02/23 07/01/24  Adline Potter, NP  ACCU-CHEK AVIVA PLUS test strip 1 each by Other route 3 (three) times daily. 04/28/20   [provider]  ACCU-CHEK FASTCLIX LANCETS MISC USE AS DIRECTED 12/10/17   Tysinger, Kermit Balo, PA-C  albuterol (VENTOLIN HFA) 108 (90 Base) MCG/ACT inhaler Inhale 2 puffs into the lungs every 6 (six) hours as needed for wheezing or shortness of breath. 12/12/20   Bevelyn Ngo, NP  amLODipine (NORVASC) 5 MG tablet TAKE 1 TABLET (5 MG TOTAL) BY MOUTH DAILY. 03/04/23   Rollene Rotunda, MD  benzonatate (TESSALON) 100 MG capsule Take 1 capsule (100 mg total) by mouth 3 (three) times daily as needed for cough. 06/24/23   Waldon Merl, PA-C  Blood Glucose Monitoring Suppl (ACCU-CHEK AVIVA PLUS) w/Device KIT USE AS DIRECTED 05/01/16   Tysinger,  Kermit Balo, PA-C  Blood Glucose Monitoring Suppl DEVI 1 each by Does not apply route in the morning, at noon, and at bedtime. May substitute to any manufacturer covered by patient's insurance. 01/22/23   Motwani, Carin Hock, MD  clonazePAM (KLONOPIN) 0.5 MG disintegrating tablet Take 0.5 mg by mouth daily as needed. 11/22/19   [provider]  Continuous Glucose Receiver (DEXCOM G7 RECEIVER) DEVI 1 Device by Does not apply route continuous. 01/22/23   Motwani, Carin Hock, MD  Continuous Glucose Sensor (DEXCOM G7 SENSOR) MISC 1 Device by Does not apply route continuous. 03/18/23   Motwani, Carin Hock, MD  Empagliflozin-metFORMIN HCl ER (SYNJARDY XR) 12.08-998 MG TB24 Take 2 tablets by mouth daily. 05/22/23   Motwani, Carin Hock, MD  fluticasone-salmeterol (ADVAIR) 250-50 MCG/ACT AEPB Inhale 1 puff into the lungs every 12 (twelve) hours. Rinse after use. 07/22/22   Parrett, Virgel Bouquet, NP  INCRUSE ELLIPTA 62.5 MCG/ACT AEPB daily as needed. 09/10/22   [provider]  insulin lispro (HUMALOG KWIKPEN) 100 UNIT/ML KwikPen Max daily 45 units 07/03/23   Shamleffer, Konrad Dolores, MD  Insulin Pen Needle 32G X 4 MM MISC 1 Device by Does not apply route in the morning, at noon, in the evening, and at bedtime. 07/03/23   Shamleffer, Konrad Dolores, MD  ipratropium-albuterol (DUONEB) 0.5-2.5 (3) MG/3ML SOLN Take 3 mLs by nebulization in the morning, at noon, and at bedtime. 09/17/22   Parrett, Babette Relic  S, NP  lamoTRIgine (LAMICTAL) 150 MG tablet Take 150 mg by mouth daily.    [provider]  levothyroxine (SYNTHROID) 75 MCG tablet TAKE 1 TABLET BY MOUTH DAILY BEFORE BREAKFAST. 06/23/23   Alveria Apley, NP  montelukast (SINGULAIR) 10 MG tablet Take 1 tablet (10 mg total) by mouth at bedtime. 04/22/22   Parrett, Virgel Bouquet, NP  naproxen (NAPROSYN) 500 MG tablet Take 1 tablet (500 mg total) by mouth 2 (two) times daily with a meal. 05/15/23   Viviano Simas, FNP  Nebulizers (COMPRESSOR/NEBULIZER) MISC Use as directed 11/15/19    Kalman Shan, MD  nitroGLYCERIN (NITROSTAT) 0.4 MG SL tablet Place 1 tablet (0.4 mg total) under the tongue every 5 (five) minutes as needed for chest pain. 03/10/22   Nahser, Deloris Ping, MD  omeprazole (PRILOSEC) 40 MG capsule Take 40 mg by mouth every morning. 08/05/21   [provider]  OVER THE COUNTER MEDICATION Take by mouth as needed. Maalox lidocain 2% takes 15-30 mls    [provider]  PARoxetine (PAXIL) 10 MG tablet Take 10 mg by mouth at bedtime. 05/27/23   [provider]  predniSONE (DELTASONE) 20 MG tablet 60 mg x3d, 40 mg x3d, 20 mg x2d, 10 mg x2d 06/17/23   Claiborne Billings, Renee A, DO  promethazine (PHENERGAN) 12.5 MG tablet Take 12.5 mg by mouth every 8 (eight) hours as needed. 09/24/21   [provider]  propranolol (INDERAL) 20 MG tablet Take 1 tablet (20 mg total) by mouth 3 (three) times daily. 03/31/23   Rollene Rotunda, MD  pseudoephedrine (SUDAFED) 120 MG 12 hr tablet Take 1 tablet (120 mg total) by mouth 2 (two) times daily. 06/11/23   Gilberto Better, PA-C  Respiratory Therapy Supplies (NEBULIZER/TUBING/MOUTHPIECE) KIT Use as directed 11/15/19   Kalman Shan, MD  ziprasidone (GEODON) 60 MG capsule Take 60 mg by mouth daily. 05/22/20   [provider]      Allergies    Bactrim [sulfamethoxazole-trimethoprim], Ciprofloxacin, Clindamycin/lincomycin, Flagyl [metronidazole], Hydrocodone, Iodinated contrast media, Ondansetron, Liraglutide, Doxycycline, and Penicillins    Review of Systems   Review of Systems  Genitourinary:  Positive for vaginal bleeding.  All other systems reviewed and are negative.   Physical Exam Updated Vital Signs BP (!) 155/90   Pulse 87   Temp 99.5 F (37.5 C) (Oral)   Resp 17   LMP 06/12/2023 (Approximate)   SpO2 95%  Physical Exam Vitals and nursing note reviewed.  Constitutional:      General: She is not in acute distress.    Appearance: She is well-developed.  HENT:     Head: Normocephalic and  atraumatic.     Mouth/Throat:     Pharynx: No oropharyngeal exudate.  Eyes:     General: No scleral icterus.       Right eye: No discharge.        Left eye: No discharge.     Conjunctiva/sclera: Conjunctivae normal.     Pupils: Pupils are equal, round, and reactive to light.  Neck:     Thyroid: No thyromegaly.     Vascular: No JVD.  Cardiovascular:     Rate and Rhythm: Normal rate and regular rhythm.     Heart sounds: Normal heart sounds. No murmur heard.    No friction rub. No gallop.  Pulmonary:     Effort: Pulmonary effort is normal. No respiratory distress.     Breath sounds: Normal breath sounds. No wheezing or rales.  Abdominal:  General: Bowel sounds are normal. There is no distension.     Palpations: Abdomen is soft. There is no mass.     Tenderness: There is no abdominal tenderness.  Genitourinary:    Comments: Chaperone present, blood in the vaginal vault, no signs of heavy bleeding, no lacerations Musculoskeletal:        General: No tenderness. Normal range of motion.     Cervical back: Normal range of motion and neck supple.     Right lower leg: No edema.     Left lower leg: No edema.  Lymphadenopathy:     Cervical: No cervical adenopathy.  Skin:    General: Skin is warm and dry.     Findings: No erythema or rash.  Neurological:     Mental Status: She is alert.     Coordination: Coordination normal.  Psychiatric:        Behavior: Behavior normal.     ED Results / Procedures / Treatments   Labs (all labs ordered are listed, but only abnormal results are displayed) Labs Reviewed  CBC - Abnormal; Notable for the following components:      Result Value   Hemoglobin 15.1 (*)    All other components within normal limits  URINALYSIS, ROUTINE W REFLEX MICROSCOPIC - Abnormal; Notable for the following components:   Glucose, UA >=500 (*)    Hgb urine dipstick LARGE (*)    Leukocytes,Ua SMALL (*)    All other components within normal limits  POC URINE PREG,  ED    EKG None  Radiology No results found.  Procedures Procedures    Medications Ordered in ED Medications - No data to display  ED Course/ Medical Decision Making/ A&P                                 Medical Decision Making Amount and/or Complexity of Data Reviewed Labs: ordered.  Risk Prescription drug management.   Patient given reassurance about her hemoglobin, urinalysis with glucose and hemoglobin, normal platelets Stable for discharge to follow-up with gynecology Patient declines birth control pill secondary to cirrhosis  Labs: Normal hemoglobin  Urinalysis: Hematuria  Vaginal exam consistent with blood from the cervical os, small amount in the vaginal vault  Abdomen nontender  Follow-up gynecology, patient given reassurance        Final Clinical Impression(s) / ED Diagnoses Final diagnoses:  Vaginal bleeding    Rx / DC Orders ED Discharge Orders          Ordered    nystatin cream (MYCOSTATIN)        07/06/23 2251              Eber Hong, MD 07/06/23 2252

## 2023-07-07 ENCOUNTER — Other Ambulatory Visit: Payer: Self-pay | Admitting: Medical

## 2023-07-07 ENCOUNTER — Ambulatory Visit
Admission: RE | Admit: 2023-07-07 | Discharge: 2023-07-07 | Disposition: A | Discharge status: Home / Self Care | Source: Ambulatory Visit | Attending: Medical

## 2023-07-07 DIAGNOSIS — M25462 Effusion, left knee: Secondary | ICD-10-CM | POA: Diagnosis not present

## 2023-07-07 DIAGNOSIS — M25562 Pain in left knee: Secondary | ICD-10-CM | POA: Diagnosis not present

## 2023-07-07 NOTE — Progress Notes (Deleted)
 Name: Emily [name] DOB: 1980-08-23 MRN: [id number]  History of Present Illness: Ms. Emily [name] is a 43 y.o. female who presents today as a new patient at [facility] . All available relevant medical records have been reviewed.  ***She is accompanied by ***.  Recent history: > 05/09/2023:  - Outside CT abdomen/pelvis w/o contrast showed no GU stones, masses, or hydronephrosis; "There is wall thickening of the urinary bladder which could be artifactual due to underdistention versus cystitis." - Urine microscopy: negative (0-2 WBC/hpf, 0-2 RBC/hpf, trace bacteria)  > 06/09/2023 primary care note: "She has endorsed increased urinary frequency. Recent UA notable for 3+ blood. Likely reflective of vaginal bleeding vs true hematuria. Otherwise not consistent with UTI."  > 07/02/2023: Seen by GI for follow up including anal fissure with rectal pain/bleeding. GI history includes internal hemorrhoids, alcoholic cirrhosis / hepatomegaly, and splenomegaly.  > 07/06/2023:  - Seen in ER for vaginal bleeding.  - Negative vaginal swab. - Normal renal function (creatinine 0.62, GFR 115).  > She has been following with GYN for persistent menorrhagia; has declined oral contraception due to alcoholic cirrhosis/advanced fibrosis.  > Urine culture results in past 12 months: - 09/07/2022: Negative - 10/21/2022: Negative - 10/27/2022: Negative - 12/29/2022: Negative - 06/09/2023: Negative  Today: She reports chief complaint of urinary frequency, ***nocturia, ***urgency, and ***urge incontinence. Voiding ***x/day and ***x/night on average. Leaking ***x/day on average; using *** ***pads / ***diapers per day on average.  Her GYN provider recently prescribed Vesicare (Solifenacin) 5 mg daily on 07/02/2023; patient {Actions; denies-reports:120008} improvement with that medication.   She {Actions; denies-reports:120008} significant caffeine intake (*** caffeinated beverages per day on  average). She {does/does not:200015} take a daily diuretic use.  She {Actions; denies-reports:120008} history of obstructive sleep apnea and {Actions; denies-reports:120008} wearing CPAP routinely. ***She denies ever having a sleep study before. She {Actions; denies-reports:120008} fluid intake within 3 hours prior to bedtime. She {Actions; denies-reports:120008} fluid intake during the night.  She {Actions; denies-reports:120008} caffeine intake within 8 hours prior to bedtime. She {Actions; denies-reports:120008} routinely experiencing lower extremity edema during the day. ***She {Actions; denies-reports:120008} elevating feet during the day and/or wearing compression socks when lower extremity edema is present during the day. ***She {Actions; denies-reports:120008} monitoring dietary salt and sodium intake.  She {Actions; denies-reports:120008} dysuria, gross hematuria, straining to void, or sensations of incomplete emptying.   Medications: Current Outpatient Medications  Medication Sig Dispense Refill   solifenacin (VESICARE) 5 MG tablet Take 1 tablet (5 mg total) by mouth daily. 30 tablet 2   ACCU-CHEK AVIVA PLUS test strip 1 each by Other route 3 (three) times daily.     ACCU-CHEK FASTCLIX LANCETS MISC USE AS DIRECTED 102 each 3   albuterol (VENTOLIN HFA) 108 (90 Base) MCG/ACT inhaler Inhale 2 puffs into the lungs every 6 (six) hours as needed for wheezing or shortness of breath. 18 g 1   amLODipine (NORVASC) 5 MG tablet TAKE 1 TABLET (5 MG TOTAL) BY MOUTH DAILY. 90 tablet 3   benzonatate (TESSALON) 100 MG capsule Take 1 capsule (100 mg total) by mouth 3 (three) times daily as needed for cough. 30 capsule 0   Blood Glucose Monitoring Suppl (ACCU-CHEK AVIVA PLUS) w/Device KIT USE AS DIRECTED 1 kit 0   Blood Glucose Monitoring Suppl DEVI 1 each by Does not apply route in the morning, at noon, and at bedtime. May substitute to any manufacturer covered by patient's insurance. 1 each 0    clonazePAM (KLONOPIN) 0.5 MG disintegrating  tablet Take 0.5 mg by mouth daily as needed.     Continuous Glucose Receiver (DEXCOM G7 RECEIVER) DEVI 1 Device by Does not apply route continuous. 1 each 0   Continuous Glucose Sensor (DEXCOM G7 SENSOR) MISC 1 Device by Does not apply route continuous. 9 each 0   Empagliflozin-metFORMIN HCl ER (SYNJARDY XR) 12.08-998 MG TB24 Take 2 tablets by mouth daily. 60 tablet 5   fluticasone-salmeterol (ADVAIR) 250-50 MCG/ACT AEPB Inhale 1 puff into the lungs every 12 (twelve) hours. Rinse after use. 60 each 5   INCRUSE ELLIPTA 62.5 MCG/ACT AEPB daily as needed.     insulin lispro (HUMALOG KWIKPEN) 100 UNIT/ML KwikPen Max daily 45 units 30 mL 4   Insulin Pen Needle 32G X 4 MM MISC 1 Device by Does not apply route in the morning, at noon, in the evening, and at bedtime. 400 each 3   ipratropium-albuterol (DUONEB) 0.5-2.5 (3) MG/3ML SOLN Take 3 mLs by nebulization in the morning, at noon, and at bedtime. 270 mL 11   lamoTRIgine (LAMICTAL) 150 MG tablet Take 150 mg by mouth daily.     levothyroxine (SYNTHROID) 75 MCG tablet TAKE 1 TABLET BY MOUTH DAILY BEFORE BREAKFAST. 90 tablet 0   montelukast (SINGULAIR) 10 MG tablet Take 1 tablet (10 mg total) by mouth at bedtime. 90 tablet 3   naproxen (NAPROSYN) 500 MG tablet Take 1 tablet (500 mg total) by mouth 2 (two) times daily with a meal. 30 tablet 0   Nebulizers (COMPRESSOR/NEBULIZER) MISC Use as directed 1 each 0   nitroGLYCERIN (NITROSTAT) 0.4 MG SL tablet Place 1 tablet (0.4 mg total) under the tongue every 5 (five) minutes as needed for chest pain. 25 tablet 3   nystatin cream (MYCOSTATIN) Apply to affected area 2 times daily 30 g 0   omeprazole (PRILOSEC) 40 MG capsule Take 40 mg by mouth every morning.     OVER THE COUNTER MEDICATION Take by mouth as needed. Maalox lidocain 2% takes 15-30 mls     PARoxetine (PAXIL) 10 MG tablet Take 10 mg by mouth at bedtime.     predniSONE (DELTASONE) 20 MG tablet 60 mg x3d, 40  mg x3d, 20 mg x2d, 10 mg x2d 18 tablet 0   promethazine (PHENERGAN) 12.5 MG tablet Take 12.5 mg by mouth every 8 (eight) hours as needed.     propranolol (INDERAL) 20 MG tablet Take 1 tablet (20 mg total) by mouth 3 (three) times daily. 270 tablet 3   pseudoephedrine (SUDAFED) 120 MG 12 hr tablet Take 1 tablet (120 mg total) by mouth 2 (two) times daily. 20 tablet 0   Respiratory Therapy Supplies (NEBULIZER/TUBING/MOUTHPIECE) KIT Use as directed 10 kit 11   ziprasidone (GEODON) 60 MG capsule Take 60 mg by mouth daily.     No current facility-administered medications for this visit.    Allergies: Allergies  Allergen Reactions   Bactrim [Sulfamethoxazole-Trimethoprim] Anaphylaxis and Other (See Comments)    Chest pain, trouble breathing   Ciprofloxacin Palpitations   Clindamycin/Lincomycin     Other reaction(s): Chest Pain   Flagyl [Metronidazole] Shortness Of Breath   Hydrocodone Other (See Comments)   Iodinated Contrast Media Swelling   Ondansetron Shortness Of Breath   Liraglutide Nausea And Vomiting   Doxycycline Nausea And Vomiting   Penicillins     Past Medical History:  Diagnosis Date   Abnormal CT of liver    Anxiety    Asthma    Asthma    Bipolar 1 disorder (HCC)  Bipolar disorder (HCC)    Chronic pain    Cirrhosis (HCC)    Depression    Diabetes mellitus without complication (HCC)    Fatty liver    Gallstones    GERD (gastroesophageal reflux disease)    Headache(784.0)    Hyperlipidemia    Hypertension    Hypothyroidism    Lupus (systemic lupus erythematosus) (HCC)    Pulmonary nodule    Tachycardia    Thyroid disease    Vitamin D deficiency    Past Surgical History:  Procedure Laterality Date   DILATION AND CURETTAGE OF UTERUS     1 yr ago   UPPER GI ENDOSCOPY  08/28/2022   Family History  Problem Relation Age of Onset   Asthma Mother    Valvular heart disease Father        Mitral valve repair Dr. Cornelius Moras 2007   Diabetes Maternal Grandmother     Hypertension Maternal Grandmother    Social History   Socioeconomic History   Marital status: Single    Spouse name: Not on file   Number of children: 0   Years of education: Not on file   Highest education level: 9th grade  Occupational History   Not on file  Tobacco Use   Smoking status: Every Day    Current packs/day: 1.00    Average packs/day: 1 pack/day for 20.0 years (20.0 ttl pk-yrs)    Types: Cigarettes   Smokeless tobacco: Never   Tobacco comments:    Smoking 0.5ppd  Vaping Use   Vaping status: Never Used  Substance and Sexual Activity   Alcohol use: No    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   Drug use: No   Sexual activity: Not Currently    Partners: Male    Birth control/protection: Abstinence  Other Topics Concern   Not on file  Social History Narrative   Not on file   Social Drivers of Health   Financial Resource Strain: Low Risk  (05/28/2023)   Overall Financial Resource Strain (CARDIA)    Difficulty of Paying Living Expenses: Not hard at all  Food Insecurity: No Food Insecurity (05/28/2023)   Hunger Vital Sign    Worried About Running Out of Food in the Last Year: Never true    Ran Out of Food in the Last Year: Never true  Transportation Needs: Unknown (05/28/2023)   PRAPARE - Administrator, Civil Service (Medical): Patient declined    Lack of Transportation (Non-Medical): No  Physical Activity: Insufficiently Active (05/28/2023)   Exercise Vital Sign    Days of Exercise per Week: 1 day    Minutes of Exercise per Session: 20 min  Stress: No Stress Concern Present (05/28/2023)   [employer] of Occupational Health - Occupational Stress Questionnaire    Feeling of Stress : Not at all  Social Connections: Socially Isolated (05/28/2023)   Social Connection and Isolation Panel [NHANES]    Frequency of Communication with Friends and Family: Three times a week    Frequency of Social Gatherings with Friends and Family: Once a week    Attends  Religious Services: Never    Database administrator or Organizations: No    Attends Banker Meetings: Never    Marital Status: Never married  Intimate Partner Violence: Not At Risk (07/06/2023)   Received from [facility]   HITS    Over the last 12 months how often did your partner physically hurt you?: Never  Over the last 12 months how often did your partner insult you or talk down to you?: Never    Over the last 12 months how often did your partner threaten you with physical harm?: Never    Over the last 12 months how often did your partner scream or curse at you?: Never    SUBJECTIVE  Review of Systems Constitutional: Patient denies any unintentional weight loss or change in strength lntegumentary: Patient denies any rashes or pruritus Eyes: Patient {Actions; denies-reports:120008} dry eyes ENT: Patient {Actions; denies-reports:120008} dry mouth Cardiovascular: Patient denies chest pain or syncope Respiratory: Patient denies shortness of breath Gastrointestinal: ***Patient {Actions; denies-reports:120008} ***nausea, ***vomiting, ***constipation, ***diarrhea ***As per HPI Musculoskeletal: Patient denies muscle cramps or weakness Neurologic: Patient denies convulsions or seizures Allergic/Immunologic: Patient denies recent allergic reaction(s) Hematologic/Lymphatic: Patient denies bleeding tendencies Endocrine: Patient denies heat/cold intolerance  GU: As per HPI.  OBJECTIVE There were no vitals filed for this visit. There is no height or weight on file to calculate BMI.  Physical Examination Constitutional: No obvious distress; patient is non-toxic appearing  Cardiovascular: No visible lower extremity edema.  Respiratory: The patient does not have audible wheezing/stridor; respirations do not appear labored  Gastrointestinal: Abdomen non-distended Musculoskeletal: Normal ROM of UEs  Skin: No obvious rashes/open sores  Neurologic: CN 2-12 grossly  intact Psychiatric: Answered questions appropriately with normal affect  Hematologic/Lymphatic/Immunologic: No obvious bruises or sites of spontaneous bleeding  UA:  ***positive for *** leukocytes, *** blood, ***nitrites ***Urine microscopy:  ***negative  *** WBC/hpf, *** RBC/hpf, *** bacteria ***with no evidence of UTI ***with no evidence of microscopic hematuria ***otherwise unremarkable ***glucosuria (secondary to ***Jardiance ***Farxiga use)  PVR: *** ml  ASSESSMENT Urinary frequency  We discussed the symptoms of overactive bladder (OAB), which include urinary urgency, frequency, nocturia, with or without urge incontinence.   While we may not know the exact etiology of OAB, several risk factors can be identified.  - We discussed this patient's neurogenic risk factors for OAB-type symptoms including ***T2DM ***with neuropathy, ***nicotine use, ***spinal stenosis, ***prior stroke, ***dementia.  - Likely exacerbated by ***diuretic use, ***caffeine intake, ***glucosuria (due to ***Jardiance / ***Farxiga use), ***ambulatory dysfunction (functional incontinence).   We discussed the following management options in detail including potential benefits, risks, and side effects: Behavioral therapy: Modify fluid intake Decreasing bladder irritants (such as caffeine) Urge suppression strategies Bladder retraining / timed voiding Double voiding Medication(s): - Anticholinergic medications: ***We discussed potential side effects of anticholinergic medications such as urinary retention, dry eyes, dry mouth, constipation, confusion, cognitive impairment / dementia.  ***Not a safe candidate for anticholinergic medications due to risk for side effects based on patient's age, comorbidities, and pre-existing ***dry mouth ***dry eyes ***constipation ***Parkinsons disease ***MS.  - Beta-3 agonist medications: We discussed potential side effects of beta-3 agonist medications such as urinary retention  and (infrequently) elevated blood pressure.  - Combination therapy with anticholinergic medication + beta-3 agonist medication. 3. For refractory cases: PTNS (posterior tibial nerve stimulation) ***Not a safe candidate for PTNS due to ***bleeding disorder, ***anticoagulant use, ***pregnancy, ***pacemaker, ***implanted cardiac defibrillator (ICD), ***neuropathy / nerve damage / nerve conduction disorder, ***lower extremity metal implant(s).  Sacral neuromodulation trial (Medtronic lnterStim or Axonics implant) Bladder Botox injections  She decided to proceed with *** ***work on behavioral modifications including ***minimizing caffeine intake and working on ***timed voiding / bladder retraining.  Will plan for follow up in *** weeks / *** months or sooner if needed. Pt verbalized understanding and agreement. All questions were answered.  PLAN Advised the following: *** ***. Minimize caffeine intake. ***. Work on timed voiding / bladder retraining. ***. Double/ triple voiding. ***. No follow-ups on file.  No orders of the defined types were placed in this encounter.   It has been explained that the patient is to follow regularly with their PCP in addition to all other providers involved in their care and to follow instructions provided by these respective offices. Patient advised to contact urology clinic if any urologic-pertaining questions, concerns, new symptoms or problems arise in the interim period.  There are no Patient Instructions on file for this visit.  Electronically signed by:  Donnita Falls, MSN, FNP-C, CUNP 07/07/2023 8:53 AM

## 2023-07-08 ENCOUNTER — Encounter: Payer: Self-pay | Admitting: Adult Health

## 2023-07-08 ENCOUNTER — Ambulatory Visit: Admitting: Adult Health

## 2023-07-08 ENCOUNTER — Telehealth: Payer: Self-pay

## 2023-07-08 ENCOUNTER — Ambulatory Visit: Payer: Medicaid Other | Admitting: Urology

## 2023-07-08 VITALS — BP 108/76 | HR 81 | Ht 63.5 in | Wt 197.0 lb

## 2023-07-08 DIAGNOSIS — K760 Fatty (change of) liver, not elsewhere classified: Secondary | ICD-10-CM

## 2023-07-08 DIAGNOSIS — R102 Pelvic and perineal pain: Secondary | ICD-10-CM | POA: Diagnosis not present

## 2023-07-08 DIAGNOSIS — K7460 Unspecified cirrhosis of liver: Secondary | ICD-10-CM | POA: Diagnosis not present

## 2023-07-08 DIAGNOSIS — R350 Frequency of micturition: Secondary | ICD-10-CM

## 2023-07-08 DIAGNOSIS — N921 Excessive and frequent menstruation with irregular cycle: Secondary | ICD-10-CM | POA: Diagnosis not present

## 2023-07-08 NOTE — Progress Notes (Signed)
  Subjective:     Patient ID: Emily [name], female   DOB: 23-Apr-1980, 43 y.o.   MRN: [id number]  HPI Emily [name] is a 43 year old white female,single, G1P0010, in complaining of irregular bleeding, has had more clots in last few days, and pelvic cramping. Was seen in ER at [facility] 07/06/23, Hgb 15.1, GC/CHL negative and HCG was negative. Lipase 84, she has fatty liver and cirrhosis, ALT 43, AST 54. ALK PHOS 229. Had [place] 06/05/23 uterus was normal, no masses, left ovary cyst 2 x 2.0 x 1.9 cm.     Component Value Date/Time   DIAGPAP (A) 07/30/2022 1120    - Atypical squamous cells of undetermined significance (ASC-US)   HPVHIGH Positive (A) 07/30/2022 1120   ADEQPAP  07/30/2022 1120    Satisfactory for evaluation; transformation zone component PRESENT.  Colpo 08/13/22 CIN 1 repeat pap in 1 yr from colpo per Dr Alysia Penna  PCP is Zandra Abts NP  Review of Systems +irregular bleeding, has had more clots in last few days, and pelvic cramping. Was seen in ER at [facility] 07/06/23,  Not having sex Reviewed past medical,surgical, social and family history. Reviewed medications and allergies.     Objective:   Physical Exam BP 108/76 (BP Location: Left Arm, Patient Position: Sitting, Cuff Size: Normal)   Pulse 81   Ht 5' 3.5" (1.613 m)   Wt 197 lb (89.4 kg)   LMP 07/01/2023 (Approximate)   BMI 34.35 kg/m     Skin warm and dry.Pelvic: external genitalia is normal in appearance no lesions, vagina:+blood,urethra has no lesions or masses noted, cervix:smooth, uterus: normal size, shape and contour, mildly tender, no masses felt, adnexa: no masses or tenderness noted. Bladder is non tender and no masses felt.   Upstream - 07/08/23 1111       Pregnancy Intention Screening   Does the patient want to become pregnant in the next year? No    Does the patient's partner want to become pregnant in the next year? No    Would the patient like to discuss contraceptive options today? No       Contraception Wrap Up   Current Method Abstinence    End Method Abstinence    Contraception Counseling Provided No            Examination chaperoned by Malachy Mood LPN  Assessment:     1. Menorrhagia with irregular cycle (Primary) irregular bleeding, has had more clots in last few days, Discussed with Dr Despina Hidden, feels ablation, may be her best option  2. Pelvic cramping +cramping  3. Fatty liver     4. Cirrhosis Plan:     Return 07/17/23 at 9:50 am for pre op with Dr Despina Hidden

## 2023-07-08 NOTE — Telephone Encounter (Signed)
 Pt called office regarding have excessive thirst due to uncontrolled DM. Informed pt excessive thirst was a symptom of uncontrolled DM.To continue to take her medication and drink lots of water.

## 2023-07-09 ENCOUNTER — Other Ambulatory Visit

## 2023-07-09 NOTE — Telephone Encounter (Signed)
 Error

## 2023-07-13 ENCOUNTER — Telehealth: Payer: Self-pay

## 2023-07-13 ENCOUNTER — Other Ambulatory Visit (HOSPITAL_COMMUNITY): Payer: Self-pay

## 2023-07-13 NOTE — Telephone Encounter (Signed)
*  Endo  Pharmacy Patient Advocate Encounter   Received notification from CoverMyMeds that prior authorization for Dexcom G7 Sensor  is required/requested.   Insurance verification completed.   The patient is insured through [facility] .   Per test claim: PA required; PA started via CoverMyMeds. KEY [id number] . Please see clinical question(s) below that I am not finding the answer to in her chart and advise.   Notes in patients chart state that she is not using any CGM and has self discontinued multiple medications. Please advise if patient is using the CGM's as prescribed or if this has been discontinued.

## 2023-07-14 NOTE — Telephone Encounter (Signed)
 Pharmacy Patient Advocate Encounter  Received notification from [facility] that Prior Authorization for Dexcom G7 Sensor has been APPROVED from 07-14-2023 to 07-13-2024   PA #/Case ID/Reference #: [id number]

## 2023-07-14 NOTE — Telephone Encounter (Signed)
 PA submitted.

## 2023-07-16 ENCOUNTER — Encounter: Payer: Self-pay | Admitting: "Endocrinology

## 2023-07-17 ENCOUNTER — Other Ambulatory Visit: Payer: Self-pay | Admitting: Adult Health

## 2023-07-17 ENCOUNTER — Ambulatory Visit: Admitting: Obstetrics & Gynecology

## 2023-07-17 ENCOUNTER — Encounter: Payer: Self-pay | Admitting: Obstetrics & Gynecology

## 2023-07-17 ENCOUNTER — Other Ambulatory Visit: Payer: Self-pay | Admitting: Obstetrics & Gynecology

## 2023-07-17 ENCOUNTER — Other Ambulatory Visit (HOSPITAL_COMMUNITY)
Admission: RE | Admit: 2023-07-17 | Discharge: 2023-07-17 | Disposition: A | Discharge status: Home / Self Care | Source: Ambulatory Visit | Attending: Obstetrics & Gynecology | Admitting: Obstetrics & Gynecology

## 2023-07-17 VITALS — BP 105/71 | HR 80 | Ht 63.0 in | Wt 197.0 lb

## 2023-07-17 DIAGNOSIS — N921 Excessive and frequent menstruation with irregular cycle: Secondary | ICD-10-CM

## 2023-07-17 DIAGNOSIS — N898 Other specified noninflammatory disorders of vagina: Secondary | ICD-10-CM | POA: Insufficient documentation

## 2023-07-17 MED ORDER — MEGESTROL ACETATE 40 MG PO TABS: ORAL_TABLET | ORAL | 3 refills | Status: DC

## 2023-07-17 NOTE — Progress Notes (Signed)
 Follow up appointment for results: Sonogram + menometrorrhgia   Chief Complaint  Patient presents with   Menorrhagia    Wants to talk about ablation    Blood pressure 105/71, pulse 80, height 5\' 3"  (1.6 m), weight 197 lb (89.4 kg), last menstrual period 07/01/2023.  CT KNEE LEFT WO CONTRAST Result Date: 07/07/2023 CLINICAL DATA:  Fall 3 weeks ago.  Knee pain, limping EXAM: CT OF THE LEFT KNEE WITHOUT CONTRAST TECHNIQUE: Multidetector CT imaging of the left knee was performed according to the standard protocol. Multiplanar CT image reconstructions were also generated. RADIATION DOSE REDUCTION: This exam was performed according to the departmental dose-optimization program which includes automated exposure control, adjustment of the mA and/or kV according to patient size and/or use of iterative reconstruction technique. COMPARISON:  None Available. FINDINGS: Bones/Joint/Cartilage Ligaments Suboptimally assessed by CT. Muscles and Tendons Unremarkable Soft tissues Unremarkable IMPRESSION: No acute bony abnormality.  Small right joint effusion. Electronically Signed   By: Charlett Nose M.D.   On: 07/07/2023 17:16   DG Chest 2 View Result Date: 06/26/2023 CLINICAL DATA:  Cough and congestion EXAM: CHEST - 2 VIEW COMPARISON:  Chest x-ray 10/02/2022 FINDINGS: The heart size and mediastinal contours are within normal limits. Both lungs are clear. The visualized skeletal structures are unremarkable. IMPRESSION: No active cardiopulmonary disease. Electronically Signed   By: Darliss Cheney M.D.   On: 06/26/2023 19:54   US PELVIC COMPLETE WITH TRANSVAGINAL Result Date: 06/05/2023 CLINICAL DATA:  43 year old premenopausal female with worsening pelvic pain and irregular vaginal bleeding. EXAM: TRANSABDOMINAL AND TRANSVAGINAL ULTRASOUND OF PELVIS TECHNIQUE: Both transabdominal and transvaginal ultrasound examinations of the pelvis were performed. Transabdominal technique was performed for global imaging of the  pelvis including uterus, ovaries, adnexal regions, and pelvic cul-de-sac. It was necessary to proceed with endovaginal exam following the transabdominal exam to visualize the uterus, endometrium and ovaries. COMPARISON:  10/03/2022 FINDINGS: Uterus Measurements: 7.6 x 3.7 x 4.7 cm = volume: 67.38 mL. No fibroids or other mass visualized. Endometrium Thickness: 7 mm.  No focal abnormality visualized. Right ovary Measurements: 2.7 x 2.7 x 2.6 cm = volume: 9.52 mL. Normal appearance/no adnexal mass. Left ovary Measurements: 3.3 x 2.2 by 2.3 cm = volume: 8.93 mL. Cyst within the left ovary measures 2 x 2.0 x 1.9 cm. Other findings No abnormal free fluid. IMPRESSION: 1. No acute findings. 2. No findings to account for patient's irregular vaginal bleeding. If bleeding remains unresponsive to hormonal or medical therapy, sonohysterogram should be considered for focal lesion work-up. (Ref: Radiological Reasoning: Algorithmic Workup of Abnormal Vaginal Bleeding with Endovaginal Sonography and Sonohysterography. AJR 2008; 119:J47-82) 3. Left ovarian cyst measures 2 cm. No follow-up imaging recommended. Electronically Signed   By: Signa Kell M.D.   On: 06/05/2023 16:45     MEDS ordered this encounter: Meds ordered this encounter  Medications   megestrol (MEGACE) 40 MG tablet    Sig: 3 tablets a day for 5 days, 2 tablets a day for 5 days then 1 tablet daily    Dispense:  45 tablet    Refill:  3    Orders for this encounter: No orders of the defined types were placed in this encounter.   Impression + Management Plan   ICD-10-CM   1. Menorrhagia with irregular cycle: endometrial ablation 09/02/23, megestrol pre op  N92.1     2. Vaginal discharge  N89.8 Cervicovaginal ancillary only( [place])      Follow Up: No follow-ups on file.  All questions were answered.  Past Medical History:  Diagnosis Date   Abnormal CT of liver    Anxiety    Asthma    Asthma    Bipolar 1 disorder (HCC)     Bipolar disorder (HCC)    Chronic pain    Cirrhosis (HCC)    Depression    Diabetes mellitus without complication (HCC)    Fatty liver    Gallstones    GERD (gastroesophageal reflux disease)    Headache(784.0)    Hyperlipidemia    Hypertension    Hypothyroidism    Lupus (systemic lupus erythematosus) (HCC)    Pulmonary nodule    Tachycardia    Thyroid disease    Vitamin D deficiency     Past Surgical History:  Procedure Laterality Date   DILATION AND CURETTAGE OF UTERUS     1 yr ago   UPPER GI ENDOSCOPY  08/28/2022    OB History     Gravida  1   Para      Term      Preterm      AB  1   Living  0      SAB  1   IAB      Ectopic      Multiple      Live Births              Allergies  Allergen Reactions   Bactrim [Sulfamethoxazole-Trimethoprim] Anaphylaxis and Other (See Comments)    Chest pain, trouble breathing   Ciprofloxacin Palpitations   Clindamycin/Lincomycin     Other reaction(s): Chest Pain   Flagyl [Metronidazole] Shortness Of Breath   Hydrocodone Other (See Comments)   Iodinated Contrast Media Swelling   Ondansetron Shortness Of Breath   Liraglutide Nausea And Vomiting   Doxycycline Nausea And Vomiting   Penicillins     Social History   Socioeconomic History   Marital status: Single    Spouse name: Not on file   Number of children: 0   Years of education: Not on file   Highest education level: 9th grade  Occupational History   Not on file  Tobacco Use   Smoking status: Every Day    Current packs/day: 1.00    Average packs/day: 1 pack/day for 20.0 years (20.0 ttl pk-yrs)    Types: Cigarettes   Smokeless tobacco: Never   Tobacco comments:    Smoking 0.5ppd  Vaping Use   Vaping status: Never Used  Substance and Sexual Activity   Alcohol use: No    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, [facility]   Hunger Vital Sign    Worried About Running Out of Food in the Last Year: Never true    Ran Out of Food in the Last Year: Never true  Transportation Needs: No Transportation Needs (07/13/2023)   Received from [facility], [facility]   [facility] - Transportation    Lack of Transportation (  Medical): No    Lack of Transportation (Non-Medical): No  Physical Activity: Unknown (07/13/2023)   Received from [facility], [facility]   Exercise Vital Sign    Days of Exercise per Week: Patient declined    Minutes of Exercise per Session: 0 min  Recent Concern: Physical Activity - Insufficiently Active (05/28/2023)   Exercise Vital Sign    Days of Exercise per Week: 1 day    Minutes of Exercise per Session: 20 min  Stress: No Stress Concern Present (07/13/2023)   Received from [facility], [facility] - Occupational Stress Questionnaire    Feeling of Stress : Not at all  Social Connections: Socially Integrated (07/13/2023)   Received from [facility], [facility]   Social Network    How would you rate your social network (family, work, friends)?: Good participation with social networks  Recent Concern: Social Connections - Socially Isolated (05/28/2023)   Social Connection and Isolation Panel [NHANES]    Frequency of Communication with Friends and Family: Three times a week    Frequency of Social Gatherings with Friends and Family: Once a week    Attends Religious Services: Never    Database administrator or Organizations: No    Attends Banker Meetings: Never     Marital Status: Never married    Family History  Problem Relation Age of Onset   Asthma Mother    Valvular heart disease Father        Mitral valve repair Dr. Cornelius Moras 2007   Diabetes Maternal Grandmother    Hypertension Maternal Grandmother

## 2023-07-20 ENCOUNTER — Encounter (HOSPITAL_BASED_OUTPATIENT_CLINIC_OR_DEPARTMENT_OTHER)

## 2023-07-20 ENCOUNTER — Encounter: Payer: Self-pay | Admitting: Obstetrics & Gynecology

## 2023-07-20 DIAGNOSIS — M25562 Pain in left knee: Secondary | ICD-10-CM | POA: Diagnosis not present

## 2023-07-20 DIAGNOSIS — M25561 Pain in right knee: Secondary | ICD-10-CM | POA: Diagnosis not present

## 2023-07-20 DIAGNOSIS — M00161 Pneumococcal arthritis, right knee: Secondary | ICD-10-CM | POA: Diagnosis not present

## 2023-07-20 LAB — CERVICOVAGINAL ANCILLARY ONLY
Bacterial Vaginitis (gardnerella): POSITIVE — AB
Candida Glabrata: POSITIVE — AB
Candida Vaginitis: NEGATIVE
Chlamydia: NEGATIVE
Comment: NEGATIVE
Comment: NEGATIVE
Comment: NEGATIVE
Comment: NEGATIVE
Comment: NEGATIVE
Comment: NORMAL
Neisseria Gonorrhea: NEGATIVE
Trichomonas: NEGATIVE

## 2023-07-21 ENCOUNTER — Encounter: Payer: Self-pay | Admitting: "Endocrinology

## 2023-07-21 ENCOUNTER — Ambulatory Visit: Admitting: "Endocrinology

## 2023-07-21 VITALS — BP 142/82 | HR 95 | Resp 16 | Ht 63.0 in | Wt 199.6 lb

## 2023-07-21 DIAGNOSIS — E1165 Type 2 diabetes mellitus with hyperglycemia: Secondary | ICD-10-CM | POA: Diagnosis not present

## 2023-07-21 DIAGNOSIS — E782 Mixed hyperlipidemia: Secondary | ICD-10-CM | POA: Diagnosis not present

## 2023-07-21 DIAGNOSIS — Z794 Long term (current) use of insulin: Secondary | ICD-10-CM

## 2023-07-21 NOTE — Progress Notes (Addendum)
 Outpatient Endocrinology Note Emily Doniphan, MD  07/21/23   Emily [name] February 09, 1981 [id number]  Referring Provider: Alveria Apley, NP Primary Care Provider: Alveria Apley, NP Reason for consultation: Subjective   Assessment & Plan  Diagnoses and all orders for this visit:  Uncontrolled type 2 diabetes mellitus with hyperglycemia (HCC)  Long-term insulin use (HCC)  Mixed hypercholesterolemia and hypertriglyceridemia     Diabetes Type II complicated by neuropathy Lab Results  Component Value Date   GFR 108.36 01/22/2023   Hba1c goal less than 7, current Hba1c is  Lab Results  Component Value Date   HGBA1C 10.9 (H) 05/20/2023   Will recommend the following: Discussed the patient the seriousness of her condition and need to resume therapy Resume Dexcom Resume previous dose that was well-tolerated without low blood sugars: Lantus 16 units once every morning. Increase by 1 unit every day until fasting blood sugar is less than 150. Stay on that dose.   Humalog 10 units 15 min before each meal    Previously, Did not do synjardy XR 08/998 Self stopped Lantus 22-25 units once every morning,Humalog 10 units 15 min before each meal, Pioglitazone 15 mg qd Patient quit all medications and Dexcom and did not keep DM appointment due to "weight gain" Tried Trulicity-reports head ache; tried victoza/mounjaro/ozempic-caused nausea/vomiting Reports trying even slow release metformin but d/c due to diarrhea  Other options include rybelsus/januvia (need to watch Tg) Patient is not open to the idea of insulin despite recommendations nor bariatric surgery consultation  No known contraindications/side effects to any of above medications No history of MEN syndrome/medullary thyroid cancer/pancreatitis or pancreatic cancer in self or family  -Last LD and Tg are as follows: Lab Results  Component Value Date   LDLCALC 102 (H) 07/16/2021    Lab Results   Component Value Date   TRIG (H) 01/16/2023    411.0 Triglyceride is over 400; calculations on Lipids are invalid.   -not on statin (has cirrhosis per pt, atorvastatin 40 mg was stopped by provider, patient will be discussed the use with her liver doctor) -Follow low fat diet and exercise   -Blood pressure goal <140/90 - Microalbumin/creatinine goal is < 30 -Last MA/Cr is as follows: Lab Results  Component Value Date   MICROALBUR <0.7 01/16/2023   -not on ACE/ARB  -diet changes including salt restriction -limit eating outside -counseled BP targets per standards of diabetes care -uncontrolled blood pressure can lead to retinopathy, nephropathy and cardiovascular and atherosclerotic heart disease  Reviewed and counseled on: -A1C target -Blood sugar targets -Complications of uncontrolled diabetes  -Checking blood sugar before meals and bedtime and bring log next visit -All medications with mechanism of action and side effects -Hypoglycemia management: rule of 15's, Glucagon Emergency Kit and medical alert ID -low-carb low-fat plate-method diet -At least 20 minutes of physical activity per day -Annual dilated retinal eye exam and foot exam -compliance and follow up needs -follow up as scheduled or earlier if problem gets worse  Call if blood sugar is less than 70 or consistently above 250    Take a 15 gm snack of carbohydrate at bedtime before you go to sleep if your blood sugar is less than 100.    If you are going to fast after midnight for a test or procedure, ask your physician for instructions on how to reduce/decrease your insulin dose.    Call if blood sugar is less than 70 or consistently above 250  -Treating a low  sugar by rule of 15  (15 gms of sugar every 15 min until sugar is more than 70) If you feel your sugar is low, test your sugar to be sure If your sugar is low (less than 70), then take 15 grams of a fast acting Carbohydrate (3-4 glucose tablets or glucose  gel or 4 ounces of juice or regular soda) Recheck your sugar 15 min after treating low to make sure it is more than 70 If sugar is still less than 70, treat again with 15 grams of carbohydrate          Don't drive the hour of hypoglycemia  If unconscious/unable to eat or drink by mouth, use glucagon injection or nasal spray baqsimi and call 911. Can repeat again in 15 min if still unconscious.  Return in about 2 weeks (around 08/04/2023).   I have reviewed current medications, nurse's notes, allergies, vital signs, past medical and surgical history, family medical history, and social history for this encounter. Counseled patient on symptoms, examination findings, lab findings, imaging results, treatment decisions and monitoring and prognosis. The patient understood the recommendations and agrees with the treatment plan. All questions regarding treatment plan were fully answered.  Emily Harmony, MD  07/21/23  History of Present Illness Emily [name] is a 43 y.o. year old female who presents for follow up of Type II diabetes mellitus.  Emily [name] was first diagnosed around 2012.   Diabetes education -  Home diabetes regimen: Few units of Lantus  COMPLICATIONS -  MI/Stroke -  retinopathy -  neuropathy -  nephropathy  BLOOD SUGAR DATA Not wearing DexCom Did not bring meter  Physical Exam  BP (!) 142/82   Pulse 95   Resp 16   Ht 5\' 3"  (1.6 m)   Wt 199 lb 9.6 oz (90.5 kg)   LMP 07/01/2023 (Approximate)   SpO2 98%   BMI 35.36 kg/m    Constitutional: well developed, well nourished Head: normocephalic, atraumatic Eyes: sclera anicteric, no redness Neck: supple Lungs: normal respiratory effort Neurology: alert and oriented Skin: dry, no appreciable rashes Musculoskeletal: no appreciable defects Psychiatric: normal mood and affect Diabetic Foot Exam - Simple   No data filed      Current Medications Patient's Medications  New Prescriptions   No medications  on file  Previous Medications   ACCU-CHEK AVIVA PLUS TEST STRIP    1 each by Other route 3 (three) times daily.   ACCU-CHEK FASTCLIX LANCETS MISC    USE AS DIRECTED   ALBUTEROL (VENTOLIN HFA) 108 (90 BASE) MCG/ACT INHALER    Inhale 2 puffs into the lungs every 6 (six) hours as needed for wheezing or shortness of breath.   AMLODIPINE (NORVASC) 5 MG TABLET    TAKE 1 TABLET (5 MG TOTAL) BY MOUTH DAILY.   BENZONATATE (TESSALON) 100 MG CAPSULE    Take 1 capsule (100 mg total) by mouth 3 (three) times daily as needed for cough.   BLOOD GLUCOSE MONITORING SUPPL (ACCU-CHEK AVIVA PLUS) W/DEVICE KIT    USE AS DIRECTED   BLOOD GLUCOSE MONITORING SUPPL DEVI    1 each by Does not apply route in the morning, at noon, and at bedtime. May substitute to any manufacturer covered by patient's insurance.   CLONAZEPAM (KLONOPIN) 0.5 MG DISINTEGRATING TABLET    Take 0.5 mg by mouth daily as needed.   CONTINUOUS GLUCOSE RECEIVER (DEXCOM G7 RECEIVER) DEVI    1 Device by Does not apply route continuous.  CONTINUOUS GLUCOSE SENSOR (DEXCOM G7 SENSOR) MISC    1 Device by Does not apply route continuous.   EMPAGLIFLOZIN-METFORMIN HCL ER (SYNJARDY XR) 12.08-998 MG TB24    Take 2 tablets by mouth daily.   FLUTICASONE-SALMETEROL (ADVAIR) 250-50 MCG/ACT AEPB    Inhale 1 puff into the lungs every 12 (twelve) hours. Rinse after use.   INCRUSE ELLIPTA 62.5 MCG/ACT AEPB    daily as needed.   INSULIN LISPRO (HUMALOG KWIKPEN) 100 UNIT/ML KWIKPEN    Max daily 45 units   INSULIN PEN NEEDLE 32G X 4 MM MISC    1 Device by Does not apply route in the morning, at noon, in the evening, and at bedtime.   IPRATROPIUM-ALBUTEROL (DUONEB) 0.5-2.5 (3) MG/3ML SOLN    Take 3 mLs by nebulization in the morning, at noon, and at bedtime.   LAMOTRIGINE (LAMICTAL) 150 MG TABLET    Take 150 mg by mouth daily.   LEVOTHYROXINE (SYNTHROID) 75 MCG TABLET    TAKE 1 TABLET BY MOUTH DAILY BEFORE BREAKFAST.   MEGESTROL (MEGACE) 40 MG TABLET    3 tablets a day  for 5 days, 2 tablets a day for 5 days then 1 tablet daily   MONTELUKAST (SINGULAIR) 10 MG TABLET    TAKE 1 TABLET BY MOUTH EVERYDAY AT BEDTIME   NAPROXEN (NAPROSYN) 500 MG TABLET    Take 1 tablet (500 mg total) by mouth 2 (two) times daily with a meal.   NEBULIZERS (COMPRESSOR/NEBULIZER) MISC    Use as directed   NITROGLYCERIN (NITROSTAT) 0.4 MG SL TABLET    Place 1 tablet (0.4 mg total) under the tongue every 5 (five) minutes as needed for chest pain.   NYSTATIN CREAM (MYCOSTATIN)    Apply to affected area 2 times daily   OMEPRAZOLE (PRILOSEC) 40 MG CAPSULE    Take 40 mg by mouth every morning.   OVER THE COUNTER MEDICATION    Take by mouth as needed. Maalox lidocain 2% takes 15-30 mls   PAROXETINE (PAXIL) 10 MG TABLET    Take 10 mg by mouth at bedtime.   PREDNISONE (DELTASONE) 20 MG TABLET    60 mg x3d, 40 mg x3d, 20 mg x2d, 10 mg x2d   PROMETHAZINE (PHENERGAN) 12.5 MG TABLET    Take 12.5 mg by mouth every 8 (eight) hours as needed.   PROPRANOLOL (INDERAL) 20 MG TABLET    Take 1 tablet (20 mg total) by mouth 3 (three) times daily.   PSEUDOEPHEDRINE (SUDAFED) 120 MG 12 HR TABLET    Take 1 tablet (120 mg total) by mouth 2 (two) times daily.   RESPIRATORY THERAPY SUPPLIES (NEBULIZER/TUBING/MOUTHPIECE) KIT    Use as directed   SOLIFENACIN (VESICARE) 5 MG TABLET    Take 1 tablet (5 mg total) by mouth daily.   ZIPRASIDONE (GEODON) 60 MG CAPSULE    Take 60 mg by mouth daily.  Modified Medications   No medications on file  Discontinued Medications   No medications on file    Allergies Allergies  Allergen Reactions   Bactrim [Sulfamethoxazole-Trimethoprim] Anaphylaxis and Other (See Comments)    Chest pain, trouble breathing   Ciprofloxacin Palpitations   Clindamycin/Lincomycin     Other reaction(s): Chest Pain   Flagyl [Metronidazole] Shortness Of Breath   Hydrocodone Other (See Comments)   Iodinated Contrast Media Swelling   Ondansetron Shortness Of Breath   Liraglutide Nausea And  Vomiting   Doxycycline Nausea And Vomiting   Penicillins     Past Medical  History Past Medical History:  Diagnosis Date   Abnormal CT of liver    Anxiety    Asthma    Asthma    Bipolar 1 disorder (HCC)    Bipolar disorder (HCC)    Chronic pain    Cirrhosis (HCC)    Depression    Diabetes mellitus without complication (HCC)    Fatty liver    Gallstones    GERD (gastroesophageal reflux disease)    Headache(784.0)    Hyperlipidemia    Hypertension    Hypothyroidism    Lupus (systemic lupus erythematosus) (HCC)    Pulmonary nodule    Tachycardia    Thyroid disease    Vitamin D deficiency     Past Surgical History Past Surgical History:  Procedure Laterality Date   DILATION AND CURETTAGE OF UTERUS     1 yr ago   UPPER GI ENDOSCOPY  08/28/2022    Family History family history includes Asthma in her mother; Diabetes in her maternal grandmother; Hypertension in her maternal grandmother; Valvular heart disease in her father.  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   Drug use: No   Sexual activity: Not Currently    Partners: Male    Birth control/protection: Abstinence  Other Topics Concern   Not on file  Social History Narrative   Not on file   Social Drivers of Health   Financial Resource Strain: Low Risk  (07/13/2023)   Received from [facility], [facility]   Overall Financial Resource Strain (CARDIA)     Difficulty of Paying Living Expenses: Not hard at all  Food Insecurity: No Food Insecurity (07/13/2023)   Received from [facility], [facility]   Hunger Vital Sign    Worried About Running Out of Food in the Last Year: Never true    Ran Out of Food in the Last Year: Never true  Transportation Needs: No Transportation Needs (07/13/2023)   Received from [facility], [facility]   PRAPARE - Transportation    Lack of Transportation (Medical): No    Lack of Transportation (Non-Medical): No  Physical Activity: Unknown (07/13/2023)   Received from [facility], [facility]   Exercise Vital Sign    Days of Exercise per Week: Patient declined    Minutes of Exercise per Session: 0 min  Recent Concern: Physical Activity - Insufficiently Active (05/28/2023)   Exercise Vital Sign    Days of Exercise per Week: 1 day    Minutes of Exercise per Session: 20 min  Stress: No Stress Concern Present (07/13/2023)   Received from [facility], [facility] of Occupational Health - Occupational Stress Questionnaire    Feeling of Stress : Not at all  Social Connections: Socially Integrated (07/13/2023)   Received from [facility], [facility]  Social Network    How would you rate your social network (family, work, friends)?: Good participation with social networks  Recent Concern: Social Connections - Socially Isolated (05/28/2023)   Social Connection and Isolation Panel [NHANES]    Frequency of Communication with Friends and Family: Three times a week    Frequency of Social Gatherings with Friends and Family: Once a week    Attends Religious Services: Never    Database administrator or Organizations: No    Attends Banker Meetings: Never    Marital Status: Never married  Intimate Partner Violence: Not At Risk (07/13/2023)   Received from [facility], [facility]   HITS    Over the last 12 months how often did your partner physically hurt you?: Never     Over the last 12 months how often did your partner insult you or talk down to you?: Never    Over the last 12 months how often did your partner threaten you with physical harm?: Never    Over the last 12 months how often did your partner scream or curse at you?: Never    Lab Results  Component Value Date   HGBA1C 10.9 (H) 05/20/2023   HGBA1C 11.0 (H) 01/16/2023   HGBA1C 9.7 (H) 09/24/2022   Lab Results  Component Value Date   CHOL 223 (H) 01/16/2023   Lab Results  Component Value Date   HDL 34.80 (L) 01/16/2023   Lab Results  Component Value Date   LDLCALC 102 (H) 07/16/2021   Lab Results  Component Value Date   TRIG (H) 01/16/2023    411.0 Triglyceride is over 400; calculations on Lipids are invalid.   Lab Results  Component Value Date   CHOLHDL 6 01/16/2023   Lab Results  Component Value Date   CREATININE 0.68 01/22/2023   Lab Results  Component Value Date   GFR 108.36 01/22/2023   Lab Results  Component Value Date   MICROALBUR <0.7 01/16/2023      Component Value Date/Time   NA 129 (L) 01/22/2023 1335   NA 135 05/02/2022 1617   K 4.3 01/22/2023 1335   CL 96 01/22/2023 1335   CO2 26 01/22/2023 1335   GLUCOSE 319 (H) 01/22/2023 1335   BUN 5 (L) 01/22/2023 1335   BUN 5 (L) 05/02/2022 1617   CREATININE 0.68 01/22/2023 1335   CREATININE 0.60 02/20/2015 0001   CALCIUM 8.8 01/22/2023 1335   PROT 7.5 01/16/2023 1041   PROT 7.0 07/16/2021 1412   ALBUMIN 3.9 01/16/2023 1041   ALBUMIN 3.6 (L) 07/16/2021 1412   AST 57 (H) 01/16/2023 1041   ALT 42 (H) 01/16/2023 1041   ALKPHOS 149 (H) 01/16/2023 1041   BILITOT 1.1 01/16/2023 1041   BILITOT 0.7 07/16/2021 1412   GFRNONAA >60 05/23/2022 0301   GFRAA 129 06/01/2018 1114      Latest Ref Rng & Units 01/22/2023    1:35 PM 01/16/2023   10:41 AM 12/01/2022    2:26 PM  BMP  Glucose 70 - 99 mg/dL 161  096  045   BUN 6 - 23 mg/dL 5  5  6    Creatinine 0.40 - 1.20 mg/dL 4.09  8.11  9.14   Sodium 135 - 145 mEq/L 129   133  126   Potassium 3.5 - 5.1 mEq/L 4.3  4.0  3.9   Chloride 96 - 112 mEq/L 96  97  94   CO2 19 - 32 mEq/L 26  28  25   Calcium 8.4 - 10.5 mg/dL 8.8  9.0  8.9        Component Value Date/Time   WBC 10.1 07/06/2023 2039   RBC 4.53 07/06/2023 2039   HGB 15.1 (H) 07/06/2023 2039   HGB 11.9 05/02/2022 1617   HCT 44.4 07/06/2023 2039   HCT 33.9 (L) 05/02/2022 1617   PLT 208 07/06/2023 2039   PLT 221 05/02/2022 1617   MCV 98.0 07/06/2023 2039   MCV 115 (H) 05/02/2022 1617   MCH 33.3 07/06/2023 2039   MCHC 34.0 07/06/2023 2039   RDW 11.9 07/06/2023 2039   RDW 11.9 05/02/2022 1617   LYMPHSABS 4.7 (H) 10/02/2022 1513   MONOABS 0.4 10/02/2022 1513   EOSABS 0.3 10/02/2022 1513   BASOSABS 0.1 10/02/2022 1513     Parts of this note may have been dictated using voice recognition software. There may be variances in spelling and vocabulary which are unintentional. Not all errors are proofread. Please notify the Thereasa Parkin if any discrepancies are noted or if the meaning of any statement is not clear.

## 2023-07-22 ENCOUNTER — Telehealth: Payer: Self-pay

## 2023-07-22 NOTE — Telephone Encounter (Signed)
 Pt called office to inform Dr Roosevelt Locks that her liver specialist did agree that pt should be on a statin.

## 2023-07-23 ENCOUNTER — Telehealth: Payer: Self-pay | Admitting: Cardiology

## 2023-07-23 ENCOUNTER — Ambulatory Visit: Payer: Self-pay | Admitting: Family Medicine

## 2023-07-23 ENCOUNTER — Other Ambulatory Visit: Payer: Self-pay | Admitting: "Endocrinology

## 2023-07-23 DIAGNOSIS — F411 Generalized anxiety disorder: Secondary | ICD-10-CM | POA: Diagnosis not present

## 2023-07-23 DIAGNOSIS — F3170 Bipolar disorder, currently in remission, most recent episode unspecified: Secondary | ICD-10-CM | POA: Diagnosis not present

## 2023-07-23 DIAGNOSIS — F410 Panic disorder [episodic paroxysmal anxiety] without agoraphobia: Secondary | ICD-10-CM | POA: Diagnosis not present

## 2023-07-23 DIAGNOSIS — F4312 Post-traumatic stress disorder, chronic: Secondary | ICD-10-CM | POA: Diagnosis not present

## 2023-07-23 MED ORDER — ROSUVASTATIN CALCIUM 5 MG PO TABS: 5.0000 mg | ORAL_TABLET | Freq: Every day | ORAL | 1 refills | Status: DC

## 2023-07-23 NOTE — Telephone Encounter (Signed)
 Patient c/o Palpitations:  STAT if patient reporting lightheadedness, shortness of breath, or chest pain  How long have you had palpitations/irregular HR/ Afib? Are you having the symptoms now? Palpitations   Are you currently experiencing lightheadedness, SOB or CP? No   Do you have a history of afib (atrial fibrillation) or irregular heart rhythm? No   Have you checked your BP or HR? (document readings if available):  [id number] yesterday   Are you experiencing any other symptoms? No    Pt states she got steroid injections in her knees and some fluid drained last week and now she is having heart fluttering. She called PCP today as well and will see them tomorrow. Please advise of any other suggestions.

## 2023-07-23 NOTE — Telephone Encounter (Signed)
 Patient identification verified by 2 forms. [name], RN     Called and spoke to patient  Patient states:  - fluttering feeling in heart.   - started this week - PCP ordered ECHO for annual follow up.  - She would like to see if she can get the ECHO completed sooner.   Patient denies:  - CP, SOB, blurred vision, muscle weakness, fatigue N/V             Interventions/Plan: - Patient will check BP and HR once she gets home and call back with that information.  - PCP appt scheduled for tomorrow and patient will update [place] once complete.  - Chart review shows no hx of afib/aflutter. Not on anticoagulant  Reviewed ED warning signs/precautions  Patient agrees with plan, no questions at this time

## 2023-07-23 NOTE — Telephone Encounter (Signed)
 Heart fluttering ongoing issue, worsened after last week  Symptoms: Palpitations Pertinent Negatives: Patient denies dizziness, sweating, chest pain, difficulty breathing  Disposition: [x] Appointment(In office)  Additional Notes: Pt states she went to [facility] last week and she got x2 steroid shots, one in each knee. Pt states she also had fluid drained off of her right knee. Pt states a few days later she started experiencing intermittent fluttering in her heart. Pt states she has had this before but it has become more frequent. Pt states she has an echocardiogram coming up. Pt states she has called and lvm with her cardiologist. This RN scheduled pt for first available appt with PCP tomorrow. This RN educated pt on new-worsening symptoms and when to call back/seek emergent care. Pt verbalized understanding and agrees to plan.    Copied from CRM [id number]. Topic: Clinical - Red Word Triage >> Jul 23, 2023 10:30 AM Elizebeth Brooking wrote: [facility] that prompted transfer to Nurse Triage: Patient called in stated she went to [facility] and got a steroid shot in her knee , she has now been experiencing some heart fluttering stated she has tested for it but wanted to know If it could be moved sooner Reason for Disposition  [1] Palpitations AND [2] no improvement after using Care Advice  Answer Assessment - Initial Assessment Questions DESCRIPTION: "Please describe your heart rate or heartbeat that you are having" (e.g., fast/slow, regular/irregular, skipped or extra beats, "palpitations")     Feels like palpitations, skipping a beat sometimes ONSET: "When did it start?" (Minutes, hours or days)      Last week worsened; has had this for a long time DURATION: "How long does it last" (e.g., seconds, minutes, hours)     Varies throughout day PATTERN "Does it come and go, or has it been constant since it started?"  "Does it get worse with exertion?"   "Are you feeling it now?"     Intermittent HEART RATE:  "Can you tell me your heart rate?" "How many beats in 15 seconds?"  (Note: not all patients can do this)       98-106 yesterday at rest RECURRENT SYMPTOM: "Have you ever had this before?" If Yes, ask: "When was the last time?" and "What happened that time?"      Happening more often CARDIAC HISTORY: "Do you have any history of heart disease?" (e.g., heart attack, angina, bypass surgery, angioplasty, arrhythmia)      Pt states she is diagnosed with tachycardia OTHER SYMPTOMS: "Do you have any other symptoms?" (e.g., dizziness, chest pain, sweating, difficulty breathing)       Denies  Protocols used: Heart Rate and Heartbeat Questions-A-AH

## 2023-07-24 ENCOUNTER — Ambulatory Visit: Admitting: Family Medicine

## 2023-07-30 ENCOUNTER — Telehealth: Payer: Self-pay | Admitting: *Deleted

## 2023-07-30 ENCOUNTER — Encounter: Payer: Self-pay | Admitting: "Endocrinology

## 2023-07-30 ENCOUNTER — Encounter: Payer: Self-pay | Admitting: Obstetrics & Gynecology

## 2023-07-30 NOTE — Telephone Encounter (Signed)
 Copied from CRM [id number]. Topic: Referral - Question >> Jul 30, 2023  3:29 PM Cammy Copa D wrote: Reason for CRM: Pt called to confirm where her referral was sent to/what location & doctor is she seeing for her echo. Please give her a call back to confirm.

## 2023-07-31 DIAGNOSIS — K911 Postgastric surgery syndromes: Secondary | ICD-10-CM | POA: Diagnosis not present

## 2023-07-31 DIAGNOSIS — R110 Nausea: Secondary | ICD-10-CM | POA: Diagnosis not present

## 2023-08-03 ENCOUNTER — Ambulatory Visit: Payer: Self-pay | Admitting: Internal Medicine

## 2023-08-03 ENCOUNTER — Ambulatory Visit (HOSPITAL_BASED_OUTPATIENT_CLINIC_OR_DEPARTMENT_OTHER)
Admission: RE | Admit: 2023-08-03 | Discharge: 2023-08-03 | Disposition: A | Discharge status: Home / Self Care | Source: Ambulatory Visit | Attending: Internal Medicine | Admitting: Internal Medicine

## 2023-08-03 DIAGNOSIS — R918 Other nonspecific abnormal finding of lung field: Secondary | ICD-10-CM | POA: Diagnosis not present

## 2023-08-03 DIAGNOSIS — R161 Splenomegaly, not elsewhere classified: Secondary | ICD-10-CM | POA: Diagnosis not present

## 2023-08-03 NOTE — Telephone Encounter (Signed)
 I called and spoke to pt. I informed pt that Dr Brooke Canton is not here and it would advise to see her pcp. Pt verbalized understanding. Pt also requested her CT results. Pt had her CT done today and I advised pt that these can take about 2 weeks. NFN

## 2023-08-03 NOTE — Telephone Encounter (Signed)
 Copied from CRM [id number]. Topic: Clinical - Red Word Triage >> Aug 03, 2023  2:28 PM Emily [name] wrote: Red Word that prompted transfer to Nurse Triage: shortness of breath - off and on for the past month - more than what she is used to.  TRIAGE SUMMARY NOTE: Pt reporting that she has been having more SOB for the past month and the feeling of SOB has stayed consistent but her duoneb nebulizer is less effective than it used to be for her symptoms. Pt reporting that she is only having SOB with exertion, SOB sometimes worse with talking a lot, her pulse ox is 95% today, her normal pulse ox is 94-95%. Pt confirms she has no wheezing or other symptoms, not struggling to breathe, confirms she does not have an albuterol inhaler at home, last prescribed in 2022. Pt requesting meds to improve her breathing. Advised pt be examined in next 4 hours to ensure relief, pt only wanting to see Dr. Bertrum Brodie, no availability today or in foreseeable future. Advised that sending HP message to office for call back to schedule appt soon, advised call back or seek more immediate care if worsening. Please advise as appropriate.  E2C2 Pulmonary Triage - Initial Assessment Questions "Chief Complaint (e.g., cough, sob, wheezing, fever, chills, sweat or additional symptoms) *Go to specific symptom protocol after initial questions. Not been doing very much because been having trouble with knees, been sitting around a lot more Requesting something for allergies or something else to improve breathing Not like I'm dying Just with exertion No trouble laying down flat, no wheezing No dizziness, weakness, chest pain, fever, runny nose Sometimes feel like taking a breath every few words if talk a lot but not normally no  "How long have symptoms been present?" Past month, been same for about a month  "Have you used your inhalers/maintenance medication?" Yes If yes, "What medications?" Albuterol nebulizer 3x/day, singulair, nebulizer  doesn't help as much as it used to  If inhaler, ask "How many puffs and how often?" Note: Review instructions on medication in the chart. No rescue inhaler  OXYGEN: "Do you wear supplemental oxygen?" No  "Do you monitor your oxygen levels?" Yes If yes, "What is your reading (oxygen level) today?" 95%  "What is your usual oxygen saturation reading?"  (Note: Pulmonary O2 sats should be 90% or greater) 94-95%  Reason for Disposition  [1] Longstanding difficulty breathing (e.g., CHF, COPD, emphysema) AND [2] WORSE than normal  Protocols used: Breathing Difficulty-A-AH

## 2023-08-03 NOTE — Telephone Encounter (Signed)
 Pt is scheduled for 08/18/23

## 2023-08-04 ENCOUNTER — Encounter: Payer: Self-pay | Admitting: Internal Medicine

## 2023-08-04 ENCOUNTER — Telehealth: Payer: Self-pay | Admitting: Internal Medicine

## 2023-08-04 ENCOUNTER — Encounter: Payer: Self-pay | Admitting: Cardiology

## 2023-08-04 ENCOUNTER — Telehealth: Payer: Self-pay | Admitting: Cardiology

## 2023-08-04 NOTE — Telephone Encounter (Signed)
 Copied from CRM [id number]. Topic: Clinical - Lab/Test Results >> Aug 03, 2023  2:30 PM Emily [name] wrote: Reason for CRM: Patient is calling to see if CT results are in from today. Please call when available.  Spoke with patient regarding prior message . Advise patient scan's are taking 3-4 weeks for the radiologist to read them. Patient stated this is crazy. Advised patient to keep checking her mychart for result's . Patient's voice was understanding nothing else further needed.

## 2023-08-04 NOTE — Telephone Encounter (Signed)
 Please see documentation in 4/15 MyChart encounter

## 2023-08-04 NOTE — Telephone Encounter (Signed)
 Patient called to check if she will need orders to have an Echocardiogram.

## 2023-08-04 NOTE — Telephone Encounter (Signed)
 Copied from CRM [id number]. Topic: Clinical - Lab/Test Results >> Aug 04, 2023  1:21 PM Alverda Joe S wrote: Reason for CRM: patient says she needs her results now, for ct scan, please call patient at [phone]

## 2023-08-05 ENCOUNTER — Other Ambulatory Visit: Payer: Self-pay

## 2023-08-05 DIAGNOSIS — K602 Anal fissure, unspecified: Secondary | ICD-10-CM | POA: Diagnosis not present

## 2023-08-05 DIAGNOSIS — E1165 Type 2 diabetes mellitus with hyperglycemia: Secondary | ICD-10-CM | POA: Diagnosis not present

## 2023-08-05 DIAGNOSIS — K8020 Calculus of gallbladder without cholecystitis without obstruction: Secondary | ICD-10-CM | POA: Diagnosis not present

## 2023-08-05 DIAGNOSIS — R7989 Other specified abnormal findings of blood chemistry: Secondary | ICD-10-CM | POA: Diagnosis not present

## 2023-08-05 DIAGNOSIS — K6289 Other specified diseases of anus and rectum: Secondary | ICD-10-CM | POA: Diagnosis not present

## 2023-08-05 DIAGNOSIS — R112 Nausea with vomiting, unspecified: Secondary | ICD-10-CM | POA: Diagnosis not present

## 2023-08-05 DIAGNOSIS — F319 Bipolar disorder, unspecified: Secondary | ICD-10-CM | POA: Diagnosis not present

## 2023-08-05 DIAGNOSIS — R1011 Right upper quadrant pain: Secondary | ICD-10-CM | POA: Diagnosis not present

## 2023-08-05 MED ORDER — ALBUTEROL SULFATE HFA 108 (90 BASE) MCG/ACT IN AERS: 2.0000 | INHALATION_SPRAY | Freq: Four times a day (QID) | RESPIRATORY_TRACT | 3 refills | Status: AC | PRN

## 2023-08-11 ENCOUNTER — Ambulatory Visit: Admitting: Urology

## 2023-08-11 ENCOUNTER — Telehealth: Payer: Self-pay

## 2023-08-11 NOTE — Telephone Encounter (Unsigned)
 Copied from CRM [id number] [id number]. Topic: Clinical - Lab/Test Results >> Aug 10, 2023  1:49 PM [name] wrote: Reason for CRM: patient is calling to get ct scan results  [phone]

## 2023-08-11 NOTE — Telephone Encounter (Signed)
 Copied from CRM [id number]. Topic: Clinical - Lab/Test Results >> Aug 10, 2023  1:49 PM [name] wrote: Reason for CRM: patient is calling to get ct scan results  [phone]  Spoke with patient regarding prior message.Advised patient that CT scan result's are taking about 2-3 weeks for the radiologist to read the scans.Advised patient to check back next week . Patient's voice was understanding.  Nothing else further needed.

## 2023-08-12 ENCOUNTER — Encounter: Payer: Self-pay | Admitting: Internal Medicine

## 2023-08-12 ENCOUNTER — Encounter: Payer: Medicaid Other | Admitting: Internal Medicine

## 2023-08-12 ENCOUNTER — Ambulatory Visit (HOSPITAL_BASED_OUTPATIENT_CLINIC_OR_DEPARTMENT_OTHER)

## 2023-08-12 ENCOUNTER — Ambulatory Visit: Payer: Self-pay

## 2023-08-12 DIAGNOSIS — R0989 Other specified symptoms and signs involving the circulatory and respiratory systems: Secondary | ICD-10-CM | POA: Diagnosis not present

## 2023-08-12 DIAGNOSIS — M79671 Pain in right foot: Secondary | ICD-10-CM

## 2023-08-12 DIAGNOSIS — M79605 Pain in left leg: Secondary | ICD-10-CM

## 2023-08-12 DIAGNOSIS — E118 Type 2 diabetes mellitus with unspecified complications: Secondary | ICD-10-CM

## 2023-08-12 DIAGNOSIS — R590 Localized enlarged lymph nodes: Secondary | ICD-10-CM

## 2023-08-12 DIAGNOSIS — M79604 Pain in right leg: Secondary | ICD-10-CM

## 2023-08-12 DIAGNOSIS — M79672 Pain in left foot: Secondary | ICD-10-CM

## 2023-08-12 DIAGNOSIS — R0602 Shortness of breath: Secondary | ICD-10-CM

## 2023-08-12 DIAGNOSIS — R918 Other nonspecific abnormal finding of lung field: Secondary | ICD-10-CM

## 2023-08-12 DIAGNOSIS — J42 Unspecified chronic bronchitis: Secondary | ICD-10-CM

## 2023-08-12 NOTE — Progress Notes (Deleted)
 Office Visit Note  Patient: Emily [name]             Date of Birth: [birth year]           MRN: [id number]             PCP: Francenia Ingle, NP Referring: Francenia Ingle, NP Visit Date: 08/12/2023 Occupation: @GUAROCC @  Subjective:  No chief complaint on file.   History of Present Illness: Emily [name] is a 43 y.o. female ***     Activities of Daily Living:  Patient reports morning stiffness for *** {minute/hour:19697}.   Patient {ACTIONS;DENIES/REPORTS:21021675::"Denies"} nocturnal pain.  Difficulty dressing/grooming: {ACTIONS;DENIES/REPORTS:21021675::"Denies"} Difficulty climbing stairs: {ACTIONS;DENIES/REPORTS:21021675::"Denies"} Difficulty getting out of chair: {ACTIONS;DENIES/REPORTS:21021675::"Denies"} Difficulty using hands for taps, buttons, cutlery, and/or writing: {ACTIONS;DENIES/REPORTS:21021675::"Denies"}  No Rheumatology ROS completed.   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/2023   Bulimia nervosa 06/15/2023   Borderline personality disorder (HCC) 06/15/2023   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 06/15/2023   Chronic prescription benzodiazepine use 06/15/2023   History of alcohol use disorder in sustained remission 06/15/2023   Polypharmacy 06/15/2023   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   Caffeine-induced insomnia with snoring 06/15/2023   Irregular bleeding 06/02/2023   Pelvic cramping 06/02/2023   Urinary frequency 06/02/2023   Pregnancy examination or test, negative result 06/02/2023   Missed periods 06/02/2023   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   Sebaceous cyst of labia 02/03/2023   Cyst of  left ovary 08/11/2022   ASCUS with positive high risk HPV cervical 08/11/2022   Moody 07/30/2022   Hot flashes 07/30/2022   Friable cervix 07/30/2022   Spotting 07/30/2022   Amenorrhea, secondary 07/30/2022   Bartholin cyst 04/30/2022   Irregular periods 04/30/2022   Dental caries 03/11/2022   Tick bite of back 12/20/2020   Chronic bronchitis (HCC) 07/09/2020   Breast tenderness 09/20/2018   Gastroesophageal reflux disease 09/16/2018   Shortness of breath 08/05/2018   Abnormal finding on lung imaging 08/05/2018   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   Swollen lymph nodes 01/06/2017   RUQ pain 10/08/2016   LLQ pain 10/08/2016   Menstrual period late 10/08/2016   Cyst of right Bartholin's gland 12/17/2015   Recurrent boils 06/18/2015   Type 2 diabetes mellitus with complication, without long-term current use of insulin  (HCC) 04/18/2015   Ingrowing toenail 04/18/2015   Vitamin D  deficiency 04/18/2015   Angular cheilitis 04/18/2015   Dyslipidemia 04/18/2015   Diabetes mellitus with complication in adult patient (HCC) 02/20/2015   Smoker 02/20/2015   Essential hypertension 02/20/2015   Pain in limb 02/20/2015   Myalgia 02/20/2015   Hypothyroidism 02/20/2015   Leg pain, inferior 02/20/2015   Toenail deformity 02/20/2015   Elevated liver enzymes 01/07/2012   Abnormal CT of liver 09/30/2011   Fatty liver 09/30/2011   G E REFLUX 11/30/2007   CHEST PAIN-UNSPECIFIED 11/30/2007   HEADACHE 10/21/2007   DYSPNEA 09/15/2007   UNSPECIFIED TACHYCARDIA 06/01/2007   COUGH 05/24/2007   TOBACCO ABUSE 05/20/2007   Pulmonary nodules 05/12/2007   DEPRESSION 04/19/2007   Asthma 04/19/2007    Past Medical History:  Diagnosis Date   Abnormal CT of liver    Anxiety    Asthma    Asthma  Bipolar 1 disorder (HCC)    Bipolar disorder (HCC)    Chronic pain    Cirrhosis (HCC)    Depression    Diabetes mellitus without complication (HCC)    Fatty liver    Gallstones    GERD  (gastroesophageal reflux disease)    Headache(784.0)    Hyperlipidemia    Hypertension    Hypothyroidism    Lupus (systemic lupus erythematosus) (HCC)    Pulmonary nodule    Tachycardia    Thyroid disease    Vitamin D  deficiency     Family History  Problem Relation Age of Onset   Asthma Mother    Valvular heart disease Father        Mitral valve repair Dr. Alva Jewels 2007   Diabetes Maternal Grandmother    Hypertension Maternal Grandmother    Past Surgical History:  Procedure Laterality Date   DILATION AND CURETTAGE OF UTERUS     1 yr ago   UPPER GI ENDOSCOPY  08/28/2022   Social History   Social History Narrative   Not on file   Immunization History  Administered Date(s) Administered   Influenza Split 03/22/2014   Influenza Whole 01/13/2008   Influenza, Quadrivalent, Recombinant, Inj, Pf 01/23/2022   Influenza, Seasonal, Injecte, Preservative Fre 04/18/2015   Influenza,inj,Quad PF,6+ Mos 04/18/2015, 01/08/2022   PFIZER(Purple Top)SARS-COV-2 Vaccination 03/12/2020, 04/16/2020   Pneumococcal Polysaccharide-23 05/10/2012   Tdap 01/02/2006, 08/08/2015     Objective: Vital Signs: There were no vitals taken for this visit.   Physical Exam   Musculoskeletal Exam: ***  CDAI Exam: CDAI Score: -- Patient Global: --; Provider Global: -- Swollen: --; Tender: -- Joint Exam 08/12/2023   No joint exam has been documented for this visit   There is currently no information documented on the homunculus. Go to the Rheumatology activity and complete the homunculus joint exam.  Investigation: No additional findings.  Imaging: CT Chest Wo Contrast Result Date: 08/12/2023 CLINICAL DATA:  Lung nodule follow-up EXAM: CT CHEST WITHOUT CONTRAST TECHNIQUE: Multidetector CT imaging of the chest was performed following the standard protocol without IV contrast. RADIATION DOSE REDUCTION: This exam was performed according to the departmental dose-optimization program which includes  automated exposure control, adjustment of the mA and/or kV according to patient size and/or use of iterative reconstruction technique. COMPARISON:  CT 10/17/2022, 07/25/2020 FINDINGS: Cardiovascular: Limited evaluation without intravenous contrast. Nonaneurysmal aorta. Coronary vascular calcification. Normal cardiac size. No pericardial effusion Mediastinum/Nodes: Patent trachea. No thyroid mass. Increased precarinal node, measures 13 mm compared with 10 mm previously. Increased subcarinal nodes, measures 11 mm. Esophagus within normal limits. Slight increase in size of right cardio phrenic nodes but subcentimeter. Lungs/Pleura: No acute airspace disease, pleural effusion or pneumothorax. Similar background bronchiolitis type changes. Multiple pulmonary nodules are noted. Some of these have decreased, for example previously noted 5 mm right upper lobe pulmonary nodule on the previous exam has resolved, however persistent 6 mm mm right upper lobe pulmonary nodule on series 3, image 66, may be slightly more prominent. Right middle lobe ground-glass focus measuring 10 x 11 mm on series 3, image 83. 4 mm right middle lobe pulmonary nodule on series 3, image 73, previously 2 mm. Upper Abdomen: Liver cirrhosis. Incompletely visualized splenomegaly Musculoskeletal: No acute or suspicious osseous abnormality. IMPRESSION: 1. Multiple bilateral pulmonary nodules are again visualized, many have decreased compared to the prior CT, however possible slight interval increase in size of right upper lobe pulmonary nodule and new focus of focal ground-glass density at the  right middle lobe described above. Continued CT surveillance in 6-12 months is recommended. 2. Mild diffuse increase in mediastinal lymph nodes which may also be reassessed at CT follow-up 3. Liver cirrhosis and splenomegaly Electronically Signed   By: Esmeralda Hedge M.D.   On: 08/12/2023 00:31    Recent Labs: Lab Results  Component Value Date   WBC 10.1  07/06/2023   HGB 15.1 (H) 07/06/2023   PLT 208 07/06/2023   NA 129 (L) 01/22/2023   K 4.3 01/22/2023   CL 96 01/22/2023   CO2 26 01/22/2023   GLUCOSE 319 (H) 01/22/2023   BUN 5 (L) 01/22/2023   CREATININE 0.68 01/22/2023   BILITOT 1.1 01/16/2023   ALKPHOS 149 (H) 01/16/2023   AST 57 (H) 01/16/2023   ALT 42 (H) 01/16/2023   PROT 7.5 01/16/2023   ALBUMIN 3.9 01/16/2023   CALCIUM  8.8 01/22/2023   GFRAA 129 06/01/2018    Speciality Comments: No specialty comments available.  Procedures:  No procedures performed Allergies: Bactrim  [sulfamethoxazole -trimethoprim ], Ciprofloxacin, Clindamycin /lincomycin, Flagyl  [metronidazole ], Hydrocodone, Iodinated contrast media, Ondansetron, Liraglutide , Doxycycline , and Penicillins   Assessment / Plan:     Visit Diagnoses: No diagnosis found.  Orders: No orders of the defined types were placed in this encounter.  No orders of the defined types were placed in this encounter.   Face-to-face time spent with patient was *** minutes. Greater than 50% of time was spent in counseling and coordination of care.  Follow-Up Instructions: No follow-ups on file.   Matt Song, MD  Note - This record has been created using [employer].  Chart creation errors have been sought, but may not always  have been located. Such creation errors do not reflect on  the standard of medical care.

## 2023-08-12 NOTE — Telephone Encounter (Signed)
 Patient received CT results to her Mychart today. Patient was calling to have results reviewed with her. Explained that provider will need to review CT and then patient will receive a phone call. Patient verbalized understanding of plan and all questions answered.    Copied from CRM [id number]. Topic: Clinical - Lab/Test Results >> Aug 12, 2023 11:19 AM [name] wrote: Reason for CRM: patient received results and wants someone to go over them with her Reason for Disposition  Caller requesting lab results  (Exception: Routine or non-urgent lab result.)  Answer Assessment - Initial Assessment Questions 1. REASON FOR CALL or QUESTION: "What is your reason for calling today?" or "How can I best help you?" or "What question do you have that I can help answer?"     Patient is calling to have her CT results reviewed with her. It is explained that the provider hasn't had a chance to review results yet. Patient should expect a phone call from pulmonary staff once MD is able to review CT results. 2. CALLER: Document the source of call. (e.g., laboratory, patient).     patient  Protocols used: PCP Call - No Triage-A-AH

## 2023-08-13 ENCOUNTER — Telehealth: Payer: Self-pay | Admitting: *Deleted

## 2023-08-13 ENCOUNTER — Encounter: Payer: Self-pay | Admitting: Family Medicine

## 2023-08-13 ENCOUNTER — Ambulatory Visit: Attending: Internal Medicine | Admitting: Internal Medicine

## 2023-08-13 ENCOUNTER — Encounter: Payer: Self-pay | Admitting: Internal Medicine

## 2023-08-13 VITALS — BP 107/74 | HR 83 | Resp 16 | Ht 63.5 in | Wt 194.2 lb

## 2023-08-13 DIAGNOSIS — R768 Other specified abnormal immunological findings in serum: Secondary | ICD-10-CM | POA: Insufficient documentation

## 2023-08-13 DIAGNOSIS — M2550 Pain in unspecified joint: Secondary | ICD-10-CM | POA: Diagnosis not present

## 2023-08-13 LAB — VAS US ABI WITH/WO TBI
Left ABI: 1.07
Right ABI: 1.08

## 2023-08-13 NOTE — Telephone Encounter (Signed)
 Please advise results?

## 2023-08-13 NOTE — Telephone Encounter (Signed)
Results not reviewed

## 2023-08-13 NOTE — Telephone Encounter (Signed)
 Copied from CRM [id number]. Topic: Clinical - Lab/Test Results >> Aug 13, 2023  8:26 AM Hilton Lucky wrote: Reason for CRM: Patient is once again calling in to request that her provider provide interpretation for her recent CT imaging. Informed that she should received a call from staff when interpretation has been completed. >> Aug 13, 2023 11:16 AM [place] J wrote: Patient calling in fro imaging results. I let her know to expect this to be addressed before the end of today's business hours.

## 2023-08-13 NOTE — Progress Notes (Signed)
 Office Visit Note  Patient: Emily [name]             Date of Birth: [birth year]           MRN: [id number]             PCP: Francenia Ingle, NP Referring: Francenia Ingle, NP Visit Date: 08/13/2023  Subjective:  New Patient (Initial Visit) (Patient has abnormal labs of Lupus)   Discussed the use of AI scribe software for clinical note transcription with the patient, who gave verbal consent to proceed.  History of Present Illness   Emily [name] is a 43 year old female with cirrhosis and joint pain who presents with chronic joint pain and abnormal labs including positive ANA and smooth muscle antibodies.  She experiences joint pain and stiffness primarily in her knees, back, legs, and toes. The pain occurs daily, especially upon waking and during activities like sweeping. She has had fluid drawn from her right knee and received cortisone injections in both knees about a month ago, which initially helped but caused her heart to flutter. The pain in her knees persists, with swelling only in the knees.  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 She experiences frequent nausea and vomiting, as well as gallstones. She is unable to take many over-the-counter anti-inflammatory medications but occasionally takes naproxen , which she finds helpful. She last took two naproxen  tablets a few days ago.  She has a positive ANA test with a titer of 1:320, raising concerns about potential autoimmune conditions. No new or changing skin rashes, but she notes darker areas on her cheeks that have worsened over time. She experiences sun sensitivity, with her skin burning easily. Her mother has a history of sarcoidosis.  No significant circulation problems, though her left foot swells if she is on it for a long time. No varicose veins, mouth or nose ulcers, hair loss, or significant sun sensitivity beyond burning. She has a history of  strep throat treated with steroids about a month ago, which made her feel hot and 'crazy'.  She has a history of smoking and drinking, which she attributes to the loss of her teeth. She is currently on SSI for her mental state and cirrhosis. She takes thyroid medication (levothyroxine  75 mcg) for hypothyroidism, which she has been on for years.      Labs reviewed 2023 ASMA 36  2020 RNP >8.0    Activities of Daily Living:  Patient reports morning stiffness for   None .   Patient Reports nocturnal pain.  Difficulty dressing/grooming: Denies Difficulty climbing stairs: Denies Difficulty getting out of chair: Denies Difficulty using hands for taps, buttons, cutlery, and/or writing: Denies  Review of Systems  Constitutional:  Positive for fatigue.  HENT:  Positive for mouth dryness. Negative for mouth sores.   Eyes:  Negative for dryness.  Respiratory:  Positive for shortness of breath.   Cardiovascular:  Positive for chest pain and palpitations.  Gastrointestinal:  Positive for constipation. Negative for blood in stool and diarrhea.  Endocrine: Negative for increased urination.  Genitourinary:  Negative for involuntary urination.  Musculoskeletal:  Positive for joint pain, joint pain, myalgias and myalgias. Negative for gait problem, joint swelling, muscle weakness, morning stiffness and muscle tenderness.  Skin:  Positive for sensitivity to sunlight. Negative for color change, rash and hair loss.  Allergic/Immunologic: Positive for susceptible to infections.  Neurological:  Positive for dizziness. Negative for  headaches.  Hematological:  Negative for swollen glands.  Psychiatric/Behavioral:  Positive for depressed mood and sleep disturbance. The patient is nervous/anxious.     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   Bulimia nervosa 06/15/2023   Borderline personality disorder (HCC) 06/15/2023   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 06/15/2023   Chronic prescription benzodiazepine use 06/15/2023   History of alcohol use disorder in sustained remission 06/15/2023   Polypharmacy 06/15/2023   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   Caffeine-induced insomnia with snoring 06/15/2023   Irregular bleeding 06/02/2023   Pelvic cramping 06/02/2023   Urinary frequency 06/02/2023   Pregnancy examination or test, negative result 06/02/2023   Missed periods 06/02/2023   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   Sebaceous cyst of labia 02/03/2023   Cyst of left ovary 08/11/2022   ASCUS with positive high risk HPV cervical 08/11/2022   Moody 07/30/2022   Hot flashes 07/30/2022   Friable cervix 07/30/2022   Spotting 07/30/2022   Amenorrhea, secondary 07/30/2022   Bartholin cyst 04/30/2022   Irregular periods 04/30/2022   Dental caries 03/11/2022   Tick bite of back 12/20/2020   Chronic bronchitis (HCC) 07/09/2020   Breast tenderness 09/20/2018   Gastroesophageal reflux disease 09/16/2018   Shortness of breath 08/05/2018   Abnormal finding on lung imaging 08/05/2018   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   Swollen lymph nodes 01/06/2017   RUQ pain 10/08/2016   LLQ pain 10/08/2016   Menstrual period late 10/08/2016   Cyst of right Bartholin's gland 12/17/2015   Recurrent boils 06/18/2015   Type 2 diabetes mellitus with complication, without long-term current use of insulin  (HCC) 04/18/2015   Ingrowing toenail 04/18/2015   Vitamin D  deficiency 04/18/2015   Angular cheilitis 04/18/2015   Dyslipidemia 04/18/2015   Diabetes mellitus with complication in adult patient (HCC) 02/20/2015    Smoker 02/20/2015   Essential hypertension 02/20/2015   Pain in limb 02/20/2015   Myalgia 02/20/2015   Hypothyroidism 02/20/2015   Leg pain, inferior 02/20/2015   Toenail deformity 02/20/2015   Elevated liver enzymes 01/07/2012   Abnormal CT of liver 09/30/2011   Fatty liver 09/30/2011   G E REFLUX 11/30/2007   CHEST PAIN-UNSPECIFIED 11/30/2007   HEADACHE 10/21/2007   DYSPNEA 09/15/2007   UNSPECIFIED TACHYCARDIA 06/01/2007   COUGH 05/24/2007   TOBACCO ABUSE 05/20/2007   Pulmonary nodules 05/12/2007   DEPRESSION 04/19/2007   Asthma 04/19/2007    Past Medical History:  Diagnosis Date   Abnormal CT of liver    Anxiety    Asthma    Asthma    Bipolar 1 disorder (HCC)    Bipolar disorder (HCC)    Chronic pain    Cirrhosis (HCC)    Depression    Diabetes mellitus without complication (HCC)    Fatty liver    Gallstones    GERD (gastroesophageal reflux disease)    Headache(784.0)    Hyperlipidemia    Hypertension    Hypothyroidism    Lupus (systemic lupus erythematosus) (HCC)    Pulmonary nodule    Tachycardia    Thyroid disease    Vitamin D  deficiency     Family History  Problem Relation Age of  Onset   Asthma Mother    Valvular heart disease Father        Mitral valve repair Dr. Alva Jewels 2007   Diabetes Maternal Grandmother    Hypertension Maternal Grandmother    Past Surgical History:  Procedure Laterality Date   DILATION AND CURETTAGE OF UTERUS     1 yr ago   UPPER GI ENDOSCOPY  08/28/2022   Social History   Social History Narrative   Not on file   Immunization History  Administered Date(s) Administered   Influenza Split 03/22/2014   Influenza Whole 01/13/2008   Influenza, Quadrivalent, Recombinant, Inj, Pf 01/23/2022   Influenza, Seasonal, Injecte, Preservative Fre 04/18/2015   Influenza,inj,Quad PF,6+ Mos 04/18/2015, 01/08/2022   PFIZER(Purple Top)SARS-COV-2 Vaccination 03/12/2020, 04/16/2020   Pneumococcal Polysaccharide-23 05/10/2012   Tdap  01/02/2006, 08/08/2015     Objective: Vital Signs: BP 107/74 (BP Location: Right Arm, Patient Position: Sitting)   Pulse 83   Resp 16   Ht 5' 3.5" (1.613 m)   Wt 194 lb 3.2 oz (88.1 kg)   BMI 33.86 kg/m    Physical Exam Constitutional:      Appearance: She is obese.  HENT:     Mouth/Throat:     Mouth: Mucous membranes are moist.     Pharynx: Oropharynx is clear.     Comments: Poor dentition/most teeth absent Eyes:     Conjunctiva/sclera: Conjunctivae normal.  Cardiovascular:     Rate and Rhythm: Normal rate and regular rhythm.  Pulmonary:     Effort: Pulmonary effort is normal.     Breath sounds: Normal breath sounds.  Lymphadenopathy:     Cervical: No cervical adenopathy.  Skin:    General: Skin is warm and dry.     Comments: Mild discoloration and skin thickening or swelling below both eyes Normal appearing nailfold capillaries  Neurological:     Mental Status: She is alert.  Psychiatric:        Mood and Affect: Mood normal.      Musculoskeletal Exam:  Shoulders full ROM no tenderness or swelling Elbows full ROM no tenderness or swelling Wrists full ROM no tenderness or swelling Fingers full ROM no tenderness or swelling Low back muscle tenderness to pressure, epi sacral lipomas Knees full ROM, right knee patellofemoral crepitus, left knee anterior joint line tenderness to pressure   Investigation: No additional findings.  Imaging: VAS US  ABI WITH/WO TBI Result Date: 08/13/2023  LOWER EXTREMITY DOPPLER STUDY Patient Name:  Emily [name]  Date of Exam:   08/12/2023 Medical Rec #: [id number]          Accession #:    [id number] Date of Birth: Aug 21, 1980          Patient Gender: F Patient Age:   37 years Exam Location:  [facility] Procedure:      VAS US  ABI WITH/WO TBI Referring Phys: Rebecca Campus --------------------------------------------------------------------------------  Indications: patient reports 4-5 months of intermittent leg pain in both calves               after walking a few minutes. Patient also reports waking with leg              pain that is resolved with heat ibproufen. Patient denies any true              claudication symptoms. Patient does indicate having back pain. High Risk Factors: Hypertension, Diabetes, current smoker.  Comparison Study: NA Performing Technologist: Gelene Kelly Rdcs,Rvt  Examination Guidelines: A complete evaluation  includes at minimum, Doppler waveform signals and systolic blood pressure reading at the level of bilateral brachial, anterior tibial, and posterior tibial arteries, when vessel segments are accessible. Bilateral testing is considered an integral part of a complete examination. Photoelectric Plethysmograph (PPG) waveforms and toe systolic pressure readings are included as required and additional duplex testing as needed. Limited examinations for reoccurring indications may be performed as noted.  ABI Findings: +---------+------------------+-----+---------+--------+ Right    Rt Pressure (mmHg)IndexWaveform Comment  +---------+------------------+-----+---------+--------+ Brachial 136                                      +---------+------------------+-----+---------+--------+ PTA      147               1.08 triphasic         +---------+------------------+-----+---------+--------+ DP       146               1.07 triphasic         +---------+------------------+-----+---------+--------+ Great Toe101               0.74                   +---------+------------------+-----+---------+--------+ +---------+------------------+-----+---------+-------+ Left     Lt Pressure (mmHg)IndexWaveform Comment +---------+------------------+-----+---------+-------+ Brachial 135                                     +---------+------------------+-----+---------+-------+ PTA      146               1.07 triphasic        +---------+------------------+-----+---------+-------+ DP       124                0.91 triphasic        +---------+------------------+-----+---------+-------+ Great Toe126               0.93 Normal           +---------+------------------+-----+---------+-------+ +-------+-----------+-----------+------------+------------+ ABI/TBIToday's ABIToday's TBIPrevious ABIPrevious TBI +-------+-----------+-----------+------------+------------+ Right  1.08       0.74                                +-------+-----------+-----------+------------+------------+ Left   1.07       0.93                                +-------+-----------+-----------+------------+------------+   Summary: Right: Resting right ankle-brachial index is within normal range. The right toe-brachial index is normal. Left: Resting left ankle-brachial index is within normal range. The left toe-brachial index is normal. *See table(s) above for measurements and observations.  Electronically signed by Armida Lander MD on 08/13/2023 at 11:12:48 AM.    Final    CT Chest Wo Contrast Result Date: 08/12/2023 CLINICAL DATA:  Lung nodule follow-up EXAM: CT CHEST WITHOUT CONTRAST TECHNIQUE: Multidetector CT imaging of the chest was performed following the standard protocol without IV contrast. RADIATION DOSE REDUCTION: This exam was performed according to the departmental dose-optimization program which includes automated exposure control, adjustment of the mA and/or kV according to patient size and/or use of iterative reconstruction technique. COMPARISON:  CT 10/17/2022, 07/25/2020 FINDINGS: Cardiovascular: Limited evaluation without intravenous contrast. Nonaneurysmal aorta. Coronary  vascular calcification. Normal cardiac size. No pericardial effusion Mediastinum/Nodes: Patent trachea. No thyroid mass. Increased precarinal node, measures 13 mm compared with 10 mm previously. Increased subcarinal nodes, measures 11 mm. Esophagus within normal limits. Slight increase in size of right cardio phrenic nodes but  subcentimeter. Lungs/Pleura: No acute airspace disease, pleural effusion or pneumothorax. Similar background bronchiolitis type changes. Multiple pulmonary nodules are noted. Some of these have decreased, for example previously noted 5 mm right upper lobe pulmonary nodule on the previous exam has resolved, however persistent 6 mm mm right upper lobe pulmonary nodule on series 3, image 66, may be slightly more prominent. Right middle lobe ground-glass focus measuring 10 x 11 mm on series 3, image 83. 4 mm right middle lobe pulmonary nodule on series 3, image 73, previously 2 mm. Upper Abdomen: Liver cirrhosis. Incompletely visualized splenomegaly Musculoskeletal: No acute or suspicious osseous abnormality. IMPRESSION: 1. Multiple bilateral pulmonary nodules are again visualized, many have decreased compared to the prior CT, however possible slight interval increase in size of right upper lobe pulmonary nodule and new focus of focal ground-glass density at the right middle lobe described above. Continued CT surveillance in 6-12 months is recommended. 2. Mild diffuse increase in mediastinal lymph nodes which may also be reassessed at CT follow-up 3. Liver cirrhosis and splenomegaly Electronically Signed   By: Esmeralda Hedge M.D.   On: 08/12/2023 00:31    Recent Labs: Lab Results  Component Value Date   WBC 10.1 07/06/2023   HGB 15.1 (H) 07/06/2023   PLT 208 07/06/2023   NA 129 (L) 01/22/2023   K 4.3 01/22/2023   CL 96 01/22/2023   CO2 26 01/22/2023   GLUCOSE 319 (H) 01/22/2023   BUN 5 (L) 01/22/2023   CREATININE 0.68 01/22/2023   BILITOT 1.1 01/16/2023   ALKPHOS 149 (H) 01/16/2023   AST 57 (H) 01/16/2023   ALT 42 (H) 01/16/2023   PROT 7.5 01/16/2023   ALBUMIN 3.9 01/16/2023   CALCIUM  8.8 01/22/2023   GFRAA 129 06/01/2018    Speciality Comments: No specialty comments available.  Procedures:  No procedures performed Allergies: Bactrim  [sulfamethoxazole -trimethoprim ], Ciprofloxacin,  Clindamycin /lincomycin, Flagyl  [metronidazole ], Hydrocodone, Iodinated contrast media, Ondansetron, Liraglutide , Doxycycline , and Penicillins   Assessment / Plan:     Visit Diagnoses: Positive ANA (antinuclear antibody) - Plan: RNP Antibody, Anti-Smith antibody, Sjogrens syndrome-A extractable nuclear antibody, Sjogrens syndrome-B extractable nuclear antibody, Anti-DNA antibody, double-stranded, C3 and C4, Anti-smooth muscle antibody, IgG Multiple previous abnormal results a high RNP antibody in 2020 but of unknown clinical significance.  Low positive anti-smooth muscle antibodies but there was not concern for autoimmune hepatitis based on hepatology clinic evaluation including liver biopsy.  No specific clinical criteria by history and physical exam today. - Order blood tests for autoimmune conditions, including lupus and Sjogren's syndrome. - Review blood test results to determine further intervention.  Osteoarthritis of knee Chronic knee pain with crepitus and joint effusion, likely due to osteoarthritis. Recent aspiration and corticosteroid injection provided temporary relief. Differential includes inflammatory arthritis, but current evidence suggests osteoarthritis. - Consider ultrasound of knees if symptoms persist or worsen. - Consider joint fluid analysis if effusion reoccurs.  Edema around eyes Chronic periorbital edema, possibly related to fluid retention. No specific etiology identified. - Refer to ophthalmologist for further evaluation if symptoms worsening.  Hypothyroidism Well-managed with levothyroxine  75 mcg daily. Discussed potential impact of thyroid disease on ANA test results.  Cirrhosis of liver Cirrhosis likely secondary to alcohol use and fatty liver. No signs of  decompensation or acute liver failure.  Fatty liver Fatty liver contributing to liver dysfunction.    Orders: Orders Placed This Encounter  Procedures   RNP Antibody   Anti-Smith antibody   Sjogrens  syndrome-A extractable nuclear antibody   Sjogrens syndrome-B extractable nuclear antibody   Anti-DNA antibody, double-stranded   C3 and C4   Anti-smooth muscle antibody, IgG   No orders of the defined types were placed in this encounter.   Follow-Up Instructions: Return in about 3 months (around 11/12/2023) for New pt +ANA /u 3mos.   Matt Song, MD  Note - This record has been created using [employer].  Chart creation errors have been sought, but may not always  have been located. Such creation errors do not reflect on  the standard of medical care.

## 2023-08-13 NOTE — Telephone Encounter (Signed)
 Spoke with pt and reviewed pt results

## 2023-08-13 NOTE — Telephone Encounter (Signed)
 Copied from CRM [id number]. Topic: Clinical - Lab/Test Results >> Aug 13, 2023  3:28 PM Adonis Hoot wrote: Reason for CRM: Patient would like a phone call to discuss results once provider has reviewed them

## 2023-08-14 ENCOUNTER — Telehealth: Payer: Self-pay | Admitting: *Deleted

## 2023-08-14 ENCOUNTER — Telehealth: Payer: Self-pay

## 2023-08-14 NOTE — Telephone Encounter (Signed)
 Called pt and notified her of results/recs  She verbalized understanding  CT order placed for 6 months

## 2023-08-14 NOTE — Telephone Encounter (Signed)
 Patient is calling back concerning the ct scan I guess for the chest patient say she spoke with someone and they mentioned a new spot on her lung but didn't go into details . Patient is wanting a better explanation of the spot that was found on her lungs . Called cal and they are going to see if a nurse is around to speak with patient  . Got nurse number and going to transfer patient to nurse

## 2023-08-14 NOTE — Telephone Encounter (Signed)
 Copied from CRM [id number]. Topic: Clinical - Lab/Test Results >> Aug 13, 2023  3:32 PM Eveleen Hinds B wrote: Reason for CRM: Patient would like a call from the office to discuss CT results.   Nurse Triage 08/12/2023 [facility]    [place], Anola Basques, RN Abnormal finding on lung imaging +4 more Dx Call PCP Within 24 Hours  Disposition Advice Only Reason for conversation   Conversations   View All  08/14/2023 - Cosign - Clinic Orders: You and Maire Scot, MD  08/12/2023 - Patient Calls: Sharlynn Dear, RN and others All Conversations: Advice Only (Newest Message First)  08/14/23 10:59 AM You routed this conversation to Maire Scot, MD      08/14/23 10:59 AM Note Called pt and notified her of results/recs  She verbalized understanding  CT order placed for 6 months            08/14/23  8:00 AM Maire Scot, MD routed this conversation to Me  Maire Scot, MD     08/14/23  8:00 AM Note       All the nodules have decreased in size although there is 1 small new groundglass opacity.  The lymphadenopathy in the mediastinum is present.   Plan - Repeat CT scan of the chest with contrast in 6 months [please tell I will not get it any sooner because she is requesting a lot of scans and this itself is going to post cancer risk]   IMPRESSION: 1. Multiple bilateral pulmonary nodules are again visualized, many have decreased compared to the prior CT, however possible slight interval increase in size of right upper lobe pulmonary nodule and new focus of focal ground-glass density at the right middle lobe described above. Continued CT surveillance in 6-12 months is recommended. 2. Mild diffuse increase in mediastinal lymph nodes which may also be reassessed at CT follow-up 3. Liver cirrhosis and splenomegaly     Electronically Signed   By: Esmeralda Hedge M.D.   On: 08/12/2023 00:31      I called the pt and went over the ct results  again. She again, verbalized understanding and denied any questions. Nothing further needed.

## 2023-08-14 NOTE — Telephone Encounter (Signed)
    All the nodules have decreased in size although there is 1 small new groundglass opacity.  The lymphadenopathy in the mediastinum is present.  Plan - Repeat CT scan of the chest with contrast in 6 months [please tell I will not get it any sooner because she is requesting a lot of scans and this itself is going to post cancer risk]  IMPRESSION: 1. Multiple bilateral pulmonary nodules are again visualized, many have decreased compared to the prior CT, however possible slight interval increase in size of right upper lobe pulmonary nodule and new focus of focal ground-glass density at the right middle lobe described above. Continued CT surveillance in 6-12 months is recommended. 2. Mild diffuse increase in mediastinal lymph nodes which may also be reassessed at CT follow-up 3. Liver cirrhosis and splenomegaly     Electronically Signed   By: Esmeralda Hedge M.D.   On: 08/12/2023 00:31

## 2023-08-14 NOTE — Telephone Encounter (Signed)
 Received incoming phone call from pt. Pt states she received call from a nurse in our office with results of her CT scan 4/14. I dont see anything showing someone contacted her. Pt states someone told her there was a new spot on her lung and couldn't understand what they were saying. I am routing to [facility] to review CT results and Gurney Lefort to F/U.

## 2023-08-14 NOTE — Addendum Note (Signed)
 Addended by: Vita Currin M on: 08/14/2023 10:59 AM   Modules accepted: Orders

## 2023-08-16 ENCOUNTER — Encounter: Payer: Self-pay | Admitting: Internal Medicine

## 2023-08-17 ENCOUNTER — Ambulatory Visit (HOSPITAL_COMMUNITY): Payer: Medicaid Other | Admitting: Psychiatry

## 2023-08-17 ENCOUNTER — Telehealth: Payer: Self-pay | Admitting: Internal Medicine

## 2023-08-17 LAB — ANTI-SMOOTH MUSCLE ANTIBODY, IGG: Actin (Smooth Muscle) Antibody (IGG): 36 U — ABNORMAL HIGH (ref <20)

## 2023-08-17 LAB — SJOGRENS SYNDROME-A EXTRACTABLE NUCLEAR ANTIBODY: SSA (Ro) (ENA) Antibody, IgG: <1 AI

## 2023-08-17 LAB — C3 AND C4
C3 Complement: 141 mg/dL (ref 83–193)
C4 Complement: 22 mg/dL (ref 15–57)

## 2023-08-17 LAB — RNP ANTIBODY: Ribonucleic Protein(ENA) Antibody, IgG: 1.9 AI — AB

## 2023-08-17 LAB — ANTI-SMITH ANTIBODY: ENA SM Ab Ser-aCnc: <1 AI

## 2023-08-17 LAB — ANTI-DNA ANTIBODY, DOUBLE-STRANDED: ds DNA Ab: <1 [IU]/mL

## 2023-08-17 LAB — SJOGRENS SYNDROME-B EXTRACTABLE NUCLEAR ANTIBODY: SSB (La) (ENA) Antibody, IgG: <1 AI

## 2023-08-17 NOTE — Telephone Encounter (Signed)
 Patient contacted the office requesting her lab results. Please review and advise. Thanks!

## 2023-08-17 NOTE — Telephone Encounter (Signed)
 Pt called requesting her lab results be read to her. Call back number is  [phone]

## 2023-08-18 ENCOUNTER — Ambulatory Visit: Admitting: "Endocrinology

## 2023-08-18 ENCOUNTER — Telehealth: Payer: Self-pay | Admitting: Internal Medicine

## 2023-08-18 NOTE — Telephone Encounter (Signed)
 Pt called again requesting her lab results be read to her. Pt is requesting a call back.

## 2023-08-18 NOTE — Telephone Encounter (Signed)
 Patient contacted the office again regarding her lab results. Patient inquired if there was a possibility of Lupus. Please advise.

## 2023-08-19 NOTE — Telephone Encounter (Signed)
 Lab results were very nonspecific. 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 The RNP antibody that was positive in 2020 at >8 was still positive but very low at 1.9. Overall based on these I am not sure if these really indicate any autoimmune disease that would be causing her joint pain. I would like to see her again if having a joint flare up to examine and if possible take a fluid sample to better determine the cause. If there is any day tomorrow not already overbooked 11:20 or if we cna work her in is fine, or to [employer] on Thursday afternoon.

## 2023-08-19 NOTE — Telephone Encounter (Signed)
 Patient advised Can she come by tomorrow afternoon to takes a look at it? Dr. Rodell Citrin would be interested to examine the knee with ultrasound and possibly take fluid from it to evaluate this. Patient states she is not available tomorrow afternoon and would have to come in sometime next week if that would be okay.   Patient still inquires about her lab results please advise.

## 2023-08-19 NOTE — Telephone Encounter (Signed)
 Emily [name] contacted the office and inquires about her lab results. Emily [name] also states she has fluid in her left knee and would like Dr. Rodell Citrin to look at her knee. Please advise if the Emily [name] should be scheduled sooner with a double book or if the Emily [name] should be scheduled at the next opening.

## 2023-08-19 NOTE — Telephone Encounter (Signed)
 Pt is aware of below results and voiced her understanding.  She wanted to make MR aware that she has a prod cough with green sputum, wheezing and increased SOB. Sx have been present for 3 weeks. Denied f/c/s or additional sx.  No recent abx or prednisone .  She is using deuoneb TID. Pending appt 09/22/2023, no sooner availability   MR, please advise. Thanks

## 2023-08-19 NOTE — Telephone Encounter (Signed)
 Can she come by tomorrow afternoon to takes a look at it? I would be interested to examine the knee with ultrasound and possibly take fluid from it to evaluate this.

## 2023-08-19 NOTE — Telephone Encounter (Signed)
   Pls read out the items in red  Plan  - repeat CT chest wothout contrast in 6 months   IMPRESSION: 1. Multiple bilateral pulmonary nodules are again visualized, many have decreased compared to the prior CT, however possible slight interval increase in size of right upper lobe pulmonary nodule and new focus of focal ground-glass density at the right middle lobe described above. Continued CT surveillance in 6-12 months is recommended. 2. Mild diffuse increase in mediastinal lymph nodes which may also be reassessed at CT follow-up 3. Liver cirrhosis and splenomegaly     Electronically Signed   By: Esmeralda Hedge M.D.   On: 08/12/2023 00:31

## 2023-08-19 NOTE — Telephone Encounter (Signed)
Please schedule patient, thank you

## 2023-08-20 NOTE — Telephone Encounter (Signed)
 Pt called back requesting to be called after 1PM due to her being in an area that does not have service.

## 2023-08-20 NOTE — Telephone Encounter (Signed)
 Pt called and is requesting a call back on cell number [phone]

## 2023-08-20 NOTE — Telephone Encounter (Signed)
 Patient advised Lab results were very nonspecific. 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 The RNP antibody that was positive in 2020 at >8 was still positive but very low at 1.9. Overall based on these Dr. Rodell Citrin is not sure if these really indicate any autoimmune disease that would be causing her joint pain. Dr. Rodell Citrin would like to see her again if having a joint flare up to examine and if possible take a fluid sample to better determine the cause. If there is any day tomorrow not already overbooked 11:20 or if we cna work her in is fine, or to [employer] on Thursday afternoon. Patient verbalized understanding. Patient transferred to the front to schedule. Patient scheduled for 08/26/2023 at 11:20 am.

## 2023-08-20 NOTE — Telephone Encounter (Signed)
 Attempted to contact the patient and left a message to call the office back.

## 2023-08-21 ENCOUNTER — Telehealth: Payer: Self-pay

## 2023-08-21 ENCOUNTER — Ambulatory Visit: Payer: Self-pay | Admitting: Internal Medicine

## 2023-08-21 ENCOUNTER — Encounter: Payer: Self-pay | Admitting: Internal Medicine

## 2023-08-21 ENCOUNTER — Ambulatory Visit: Payer: Self-pay

## 2023-08-21 ENCOUNTER — Telehealth: Admitting: Family Medicine

## 2023-08-21 DIAGNOSIS — J411 Mucopurulent chronic bronchitis: Secondary | ICD-10-CM | POA: Diagnosis not present

## 2023-08-21 MED ORDER — AZITHROMYCIN 250 MG PO TABS: ORAL_TABLET | ORAL | 0 refills | Status: AC

## 2023-08-21 NOTE — Telephone Encounter (Signed)
 Pt calling in to get additional information regarding her recent CT results. All questions and concerns addressed. This RN educated pt on home care, new-worsening symptoms, when to call back/seek emergent care. Pt verbalized understanding and agrees to plan.  Reason for Disposition  [1] Other NON-URGENT information for PCP AND [2] does not require PCP response  Answer Assessment - Initial Assessment Questions 1. REASON FOR CALL or QUESTION: "What is your reason for calling today?" or "How can I best help you?" or "What question do you have that I can help answer?"     CT Results 2. CALLER: Document the source of call. (e.g., laboratory, patient).     Patient  Protocols used: PCP Call - No Triage-A-AH

## 2023-08-21 NOTE — Progress Notes (Signed)
E-Visit for Cough  We are sorry that you are not feeling well.  Here is how we plan to help!  Based on your presentation I believe you most likely have A cough due to bacteria.  When patients have a fever and a productive cough with a change in color or increased sputum production, we are concerned about bacterial bronchitis.  If left untreated it can progress to pneumonia.  If your symptoms do not improve with your treatment plan it is important that you contact your provider.   I have prescribed Azithromyin 250 mg: two tablets now and then one tablet daily for 4 additonal days     From your responses in the eVisit questionnaire you describe inflammation in the upper respiratory tract which is causing a significant cough.  This is commonly called Bronchitis and has four common causes:   Allergies Viral Infections Acid Reflux Bacterial Infection Allergies, viruses and acid reflux are treated by controlling symptoms or eliminating the cause. An example might be a cough caused by taking certain blood pressure medications. You stop the cough by changing the medication. Another example might be a cough caused by acid reflux. Controlling the reflux helps control the cough.  USE OF BRONCHODILATOR ("RESCUE") INHALERS: There is a risk from using your bronchodilator too frequently.  The risk is that over-reliance on a medication which only relaxes the muscles surrounding the breathing tubes can reduce the effectiveness of medications prescribed to reduce swelling and congestion of the tubes themselves.  Although you feel brief relief from the bronchodilator inhaler, your asthma may actually be worsening with the tubes becoming more swollen and filled with mucus.  This can delay other crucial treatments, such as oral steroid medications. If you need to use a bronchodilator inhaler daily, several times per day, you should discuss this with your provider.  There are probably better treatments that could be used  to keep your asthma under control.     HOME CARE Only take medications as instructed by your medical team. Complete the entire course of an antibiotic. Drink plenty of fluids and get plenty of rest. Avoid close contacts especially the very young and the elderly Cover your mouth if you cough or cough into your sleeve. Always remember to wash your hands A steam or ultrasonic humidifier can help congestion.   GET HELP RIGHT AWAY IF: You develop worsening fever. You become short of breath You cough up blood. Your symptoms persist after you have completed your treatment plan MAKE SURE YOU  Understand these instructions. Will watch your condition. Will get help right away if you are not doing well or get worse.    Thank you for choosing an e-visit.  Your e-visit answers were reviewed by a board certified advanced clinical practitioner to complete your personal care plan. Depending upon the condition, your plan could have included both over the counter or prescription medications.  Please review your pharmacy choice. Make sure the pharmacy is open so you can pick up prescription now. If there is a problem, you may contact your provider through [employer] and have the prescription routed to another pharmacy.  Your safety is important to [place]. If you have drug allergies check your prescription carefully.   For the next 24 hours you can use MyChart to ask questions about today's visit, request a non-urgent call back, or ask for a work or school excuse. You will get an email in the next two days asking about your experience. I hope that  your e-visit has been valuable and will speed your recovery.    have provided 5 minutes of non face to face time during this encounter for chart review and documentation.

## 2023-08-21 NOTE — Telephone Encounter (Unsigned)
 Copied from CRM [id number]. Topic: Clinical - Medical Advice >> Aug 21, 2023 12:23 PM Tyronne Galloway wrote: Reason for CRM: Pt sent a Mychart message to her provider Dr. Bertrum Brodie for medicaiton to be called in to her preferred pharmacy at [id number] - [place], [place] - [address] [place] [address] Phone: [phone] Fax: [phone] , but she did not receive a message regarding her request. Patient's best phone number [phone] ok to leave a vm.

## 2023-08-21 NOTE — Telephone Encounter (Addendum)
 Chief Complaint: SOB Symptoms: productive Frequency: few weeks Pertinent Negatives: Patient denies fever, URI sx Disposition: [] ED /[] Urgent Care (no appt availability in office) / [x] Appointment(In office/virtual)/ []  [facility]/ [] Home Care/ [] Refused Recommended Disposition /[] [facility]/ []  Follow-up with PCP Additional Notes: Pt c/o mild SOB with exertion and productive cough for a few weeks. Pt reports recent CT scan showed "lung infection" and results have not been reviewed with her or abx given to treat infection. Pt does endorse productive green cough, but denies fevers, URI sx. Pt endorses taking DuoNeb as prescribed. Triager did not appreciate any audible SOB/wheezing, and pt speaking in full sentences. Triager attempted to schedule with [facility], but no access so scheduled with PCP as alternative on 08/24/2023. Additionally, pt requesting abx/cough medicine. Triager will forward encounter for Dr. Mardell Shade office to review and advise. Patient verbalized understanding and is expecting call back from office for next steps/if Rx is ordered. Triager also advised that if pt does not hear back from office, to follow disposition for further evaluation/treatment with PCP.      Copied from CRM [id number]. Topic: Clinical - Red Word Triage >> Aug 21, 2023  2:52 PM Crist Dominion wrote: Red Word that prompted transfer to Nurse Triage: Shortness of breath Reason for Disposition  [1] MODERATE longstanding difficulty breathing (e.g., speaks in phrases, SOB even at rest, pulse 100-120) AND [2] SAME as normal  Answer Assessment - Initial Assessment Questions E2C2 Pulmonary Triage - Initial Assessment Questions "Chief Complaint (e.g., cough, sob, wheezing, fever, chills, sweat or additional symptoms) *Go to specific symptom protocol after initial questions. SOB, productive clear/green coughing, H/A Pt reports calling last week, and yesterday  Requesting Z pack and cough  medication  "How long have symptoms been present?" A few weeks  Have you tested for COVID or Flu? Note: If not, ask patient if a home test can be taken. If so, instruct patient to call back for positive results. No  MEDICINES:   "Have you used any OTC meds to help with symptoms?" No If yes, ask "What medications?" Delsym - no relief  "Have you used your inhalers/maintenance medication?" Yes If yes, "What medications?" DuoNeb takes 3x daily (4x with sickness) - reports with results  If inhaler, ask "How many puffs and how often?" Note: Review instructions on medication in the chart. See above  OXYGEN: "Do you wear supplemental oxygen?" No If yes, "How many liters are you supposed to use?" N/a  "Do you monitor your oxygen levels?" Yes If yes, "What is your reading (oxygen level) today?" 93-95 yesterday  "What is your usual oxygen saturation reading?"  (Note: Pulmonary O2 sats should be 90% or greater) Mid-90s   3. PATTERN "Does the difficult breathing come and go, or has it been constant since it started?"      Pretty constant 4. SEVERITY: "How bad is your breathing?" (e.g., mild, moderate, severe)    - MILD: No SOB at rest, mild SOB with walking, speaks normally in sentences, can lie down, no retractions, pulse < 100.    - MODERATE: SOB at rest, SOB with minimal exertion and prefers to sit, cannot lie down flat, speaks in phrases, mild retractions, audible wheezing, pulse 100-120.    - SEVERE: Very SOB at rest, speaks in single words, struggling to breathe, sitting hunched forward, retractions, pulse > 120      Mild - with exertion 5. RECURRENT SYMPTOM: "Have you had difficulty breathing before?" If Yes, ask: "When was the last  time?" and "What happened that time?"      Abx, cough meds 6. CARDIAC HISTORY: "Do you have any history of heart disease?" (e.g., heart attack, angina, bypass surgery, angioplasty)      denies 7. LUNG HISTORY: "Do you have any history of lung  disease?"  (e.g., pulmonary embolus, asthma, emphysema)     Nodules, asthma 8. CAUSE: "What do you think is causing the breathing problem?"      Reports has a "lung infection" 9. OTHER SYMPTOMS: "Do you have any other symptoms? (e.g., dizziness, runny nose, cough, chest pain, fever)     cough  Protocols used: Breathing Difficulty-A-AH

## 2023-08-21 NOTE — Telephone Encounter (Signed)
 Contacted the patient and advised Dr. Rodell Citrin is fully booked today and will not be able to see her. Advised the patient she does have an appointment next week on 08/25/2023 and that we will see her then. Patient inquired if [facility] would evaluate her. Inquired if the patient has been there before. Patient states she has been there before. Advised the patient to call [facility] and ask them if they could evaluate her as she has been there before. Patient verbalized understanding.

## 2023-08-24 ENCOUNTER — Ambulatory Visit (INDEPENDENT_AMBULATORY_CARE_PROVIDER_SITE_OTHER)

## 2023-08-24 ENCOUNTER — Encounter: Payer: Self-pay | Admitting: Family Medicine

## 2023-08-24 ENCOUNTER — Ambulatory Visit: Admitting: Family Medicine

## 2023-08-24 VITALS — BP 128/68 | HR 85 | Temp 98.2°F | Ht 63.5 in | Wt 193.0 lb

## 2023-08-24 DIAGNOSIS — R051 Acute cough: Secondary | ICD-10-CM

## 2023-08-24 DIAGNOSIS — R0602 Shortness of breath: Secondary | ICD-10-CM

## 2023-08-24 DIAGNOSIS — R002 Palpitations: Secondary | ICD-10-CM | POA: Diagnosis not present

## 2023-08-24 NOTE — Patient Instructions (Addendum)
-  EKG completed today for complaints of previously experiencing palpitations. Sinus rhythm. No concerns.  -Ordered chest x-ray due to shortness of breath and cough. Office will call with results and you will see them on MyChart. -If you experience chest pain, palpitations, or increased shortness of breath, recommend to go to the closes emergency department.

## 2023-08-24 NOTE — Telephone Encounter (Signed)
 Routing to Dr Bertrum Brodie for CT results

## 2023-08-24 NOTE — Progress Notes (Signed)
 Established Patient Office Visit   Subjective:  Patient ID: Emily [name], female    DOB: 1980/12/07  Age: 43 y.o. MRN: [id number]  Chief Complaint  Patient presents with   Cough   Shortness of Breath    HPI:  Patient is experiencing a productive cough and shortness of breath. She reports this has been going on for a couple of weeks. She reports she has been smoking more with increased stress.   Denies fever, chest pain. But, did have heart palpitations this morning. Resolved now.   Patient had a e-visit on Friday with [facility]. Dx with mucopurulent chronic bronchitis. Prescribed Azithromycin  Z-pak. She reports the cough since taking the medication. She reports it has improved a little bit.   She reports she tried Delsym for the cough, but didn't help.   Patient has a CT Chest scan on 04/14 with the following results from pulmonary.  IMPRESSION: 1. Multiple bilateral pulmonary nodules are again visualized, many have decreased compared to the prior CT, however possible slight interval increase in size of right upper lobe pulmonary nodule and new focus of focal ground-glass density at the right middle lobe described above. Continued CT surveillance in 6-12 months is recommended. 2. Mild diffuse increase in mediastinal lymph nodes which may also be reassessed at CT follow-up 3. Liver cirrhosis and splenomegaly -Suppose to have a  Review of Systems  Respiratory:  Positive for cough and shortness of breath.   Gastrointestinal:  Positive for constipation.  See HPI above     Objective:   BP 128/68   Pulse 85   Temp 98.2 F (36.8 C) (Oral)   Ht 5' 3.5" (1.613 m)   Wt 193 lb (87.5 kg)   LMP  (LMP Unknown)   SpO2 96%   BMI 33.65 kg/m    Physical Exam Vitals reviewed.  Constitutional:      General: She is not in acute distress.    Appearance: Normal appearance. She is obese. She is not ill-appearing, toxic-appearing or diaphoretic.  HENT:     Head:  Normocephalic and atraumatic.  Eyes:     General:        Right eye: No discharge.        Left eye: No discharge.     Conjunctiva/sclera: Conjunctivae normal.  Cardiovascular:     Rate and Rhythm: Normal rate and regular rhythm.     Heart sounds: Normal heart sounds. No murmur heard.    No friction rub. No gallop.  Pulmonary:     Effort: Pulmonary effort is normal. No respiratory distress.     Breath sounds: Normal breath sounds.     Comments: No cough during visit, talking in full sentences with no distress.  Musculoskeletal:        General: Normal range of motion.  Skin:    General: Skin is warm and dry.  Neurological:     General: No focal deficit present.     Mental Status: She is alert and oriented to person, place, and time. Mental status is at baseline.  Psychiatric:        Mood and Affect: Mood normal.        Behavior: Behavior normal.        Thought Content: Thought content normal.        Judgment: Judgment normal.   -EKG completed for complaints of palpitations earlier today. Sinus rhythm, HR 78. No significant change from previous EKG on 03-23-2023.   Assessment & Plan:  Palpitations -  EKG 12-Lead  Acute cough -     DG Chest 2 View; Future  Shortness of breath -     DG Chest 2 View; Future  -EKG completed today for complaints of previously experiencing palpitations. Sinus rhythm. No concerns.  -Ordered chest x-ray due to shortness of breath and cough after starting Azithromycin .  -Advised if she experiences chest pain, palpitations, or increased shortness of breath, recommend to go to the closes emergency department.   Neythan Kozlov, NP

## 2023-08-25 ENCOUNTER — Ambulatory Visit: Admitting: Internal Medicine

## 2023-08-25 ENCOUNTER — Other Ambulatory Visit: Payer: Self-pay

## 2023-08-25 NOTE — Progress Notes (Deleted)
 Office Visit Note  Patient: Emily [name]             Date of Birth: 13-Apr-1981           MRN: [id number]             PCP: Francenia Ingle, NP Referring: Francenia Ingle, NP Visit Date: 08/25/2023   Subjective:  No chief complaint on file.   History of Present Illness: Emily [name] is a 43 y.o. female here for follow up ***   Previous HPI 08/13/23 Emily [name] is a 43 year old female with cirrhosis and joint pain who presents with chronic joint pain and abnormal labs including positive ANA and smooth muscle antibodies.   She experiences joint pain and stiffness primarily in her knees, back, legs, and toes. The pain occurs daily, especially upon waking and during activities like sweeping. She has had fluid drawn from her right knee and received cortisone injections in both knees about a month ago, which initially helped but caused her heart to flutter. The pain in her knees persists, with swelling only in the knees.   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 She experiences frequent nausea and vomiting, as well as gallstones. She is unable to take many over-the-counter anti-inflammatory medications but occasionally takes naproxen , which she finds helpful. She last took two naproxen  tablets a few days ago.   She has a positive ANA test with a titer of 1:320, raising concerns about potential autoimmune conditions. No new or changing skin rashes, but she notes darker areas on her cheeks that have worsened over time. She experiences sun sensitivity, with her skin burning easily. Her mother has a history of sarcoidosis.   No significant circulation problems, though her left foot swells if she is on it for a long time. No varicose veins, mouth or nose ulcers, hair loss, or significant sun sensitivity beyond burning. She has a history of strep throat treated with steroids about a month ago, which made her feel hot  and 'crazy'.   She has a history of smoking and drinking, which she attributes to the loss of her teeth. She is currently on SSI for her mental state and cirrhosis. She takes thyroid medication (levothyroxine  75 mcg) for hypothyroidism, which she has been on for years.       Labs reviewed 2023 ASMA 36   2020 RNP >8.0    No Rheumatology ROS completed.   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   Bulimia nervosa 06/15/2023   Borderline personality disorder (HCC) 06/15/2023   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 06/15/2023   Chronic prescription benzodiazepine use 06/15/2023   History of alcohol use disorder in sustained remission 06/15/2023   Polypharmacy 06/15/2023   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   Caffeine-induced insomnia with snoring 06/15/2023   Irregular bleeding 06/02/2023   Pelvic cramping 06/02/2023   Urinary frequency 06/02/2023   Pregnancy examination or test, negative result 06/02/2023   Missed periods 06/02/2023   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   Sebaceous cyst of labia 02/03/2023   Cyst of left ovary 08/11/2022  ASCUS with positive high risk HPV cervical 08/11/2022   Moody 07/30/2022   Hot flashes 07/30/2022   Friable cervix 07/30/2022   Spotting 07/30/2022   Amenorrhea, secondary 07/30/2022   Bartholin cyst 04/30/2022   Irregular periods 04/30/2022   Dental caries 03/11/2022   Tick bite of back 12/20/2020   Chronic bronchitis (HCC) 07/09/2020   Breast tenderness 09/20/2018   Gastroesophageal reflux disease 09/16/2018   Shortness of breath 08/05/2018    Abnormal finding on lung imaging 08/05/2018   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   Swollen lymph nodes 01/06/2017   RUQ pain 10/08/2016   LLQ pain 10/08/2016   Menstrual period late 10/08/2016   Cyst of right Bartholin's gland 12/17/2015   Recurrent boils 06/18/2015   Type 2 diabetes mellitus with complication, without long-term current use of insulin  (HCC) 04/18/2015   Ingrowing toenail 04/18/2015   Vitamin D  deficiency 04/18/2015   Angular cheilitis 04/18/2015   Dyslipidemia 04/18/2015   Diabetes mellitus with complication in adult patient (HCC) 02/20/2015   Smoker 02/20/2015   Essential hypertension 02/20/2015   Pain in limb 02/20/2015   Myalgia 02/20/2015   Hypothyroidism 02/20/2015   Leg pain, inferior 02/20/2015   Toenail deformity 02/20/2015   Elevated liver enzymes 01/07/2012   Abnormal CT of liver 09/30/2011   Fatty liver 09/30/2011   G E REFLUX 11/30/2007   CHEST PAIN-UNSPECIFIED 11/30/2007   HEADACHE 10/21/2007   DYSPNEA 09/15/2007   UNSPECIFIED TACHYCARDIA 06/01/2007   COUGH 05/24/2007   TOBACCO ABUSE 05/20/2007   Pulmonary nodules 05/12/2007   DEPRESSION 04/19/2007   Asthma 04/19/2007    Past Medical History:  Diagnosis Date   Abnormal CT of liver    Anxiety    Asthma    Asthma    Bipolar 1 disorder (HCC)    Bipolar disorder (HCC)    Chronic pain    Cirrhosis (HCC)    Depression    Diabetes mellitus without complication (HCC)    Fatty liver    Gallstones    GERD (gastroesophageal reflux disease)    Headache(784.0)    Hyperlipidemia    Hypertension    Hypothyroidism    Lupus (systemic lupus erythematosus) (HCC)    Pulmonary nodule    Tachycardia    Thyroid disease    Vitamin D  deficiency     Family History  Problem Relation Age of Onset   Asthma Mother    Valvular heart disease Father        Mitral valve repair Dr. Alva Jewels 2007   Diabetes Maternal Grandmother    Hypertension Maternal Grandmother    Past Surgical History:  Procedure  Laterality Date   DILATION AND CURETTAGE OF UTERUS     1 yr ago   UPPER GI ENDOSCOPY  08/28/2022   Social History   Social History Narrative   Not on file   Immunization History  Administered Date(s) Administered   Influenza Split 03/22/2014   Influenza Whole 01/13/2008   Influenza, Quadrivalent, Recombinant, Inj, Pf 01/23/2022   Influenza, Seasonal, Injecte, Preservative Fre 04/18/2015   Influenza,inj,Quad PF,6+ Mos 04/18/2015, 01/08/2022   PFIZER(Purple Top)SARS-COV-2 Vaccination 03/12/2020, 04/16/2020   Pneumococcal Polysaccharide-23 05/10/2012   Tdap 01/02/2006, 08/08/2015     Objective: Vital Signs: LMP  (LMP Unknown)    Physical Exam   Musculoskeletal Exam: ***  CDAI Exam: CDAI Score: -- Patient Global: --; Provider Global: -- Swollen: --; Tender: -- Joint Exam 08/25/2023   No joint exam has been documented for this visit   There is currently  no information documented on the homunculus. Go to the Rheumatology activity and complete the homunculus joint exam.  Investigation: No additional findings.  Imaging: DG Chest 2 View Result Date: 08/24/2023 CLINICAL DATA:  Shortness of breath. EXAM: CHEST - 2 VIEW COMPARISON:  Chest radiograph dated 06/26/2023 FINDINGS: The heart size and mediastinal contours are within normal limits. Both lungs are clear. The visualized skeletal structures are unremarkable. IMPRESSION: No active cardiopulmonary disease. Electronically Signed   By: Angus Bark M.D.   On: 08/24/2023 14:44   VAS US  ABI WITH/WO TBI Result Date: 08/13/2023  LOWER EXTREMITY DOPPLER STUDY Patient Name:  Emily [name]  Date of Exam:   08/12/2023 Medical Rec #: [id number]          Accession #:    [id number] Date of Birth: 1980-05-16          Patient Gender: F Patient Age:   63 years Exam Location:  [facility] Procedure:      VAS US  ABI WITH/WO TBI Referring Phys: Rebecca Campus --------------------------------------------------------------------------------   Indications: patient reports 4-5 months of intermittent leg pain in both calves              after walking a few minutes. Patient also reports waking with leg              pain that is resolved with heat ibproufen. Patient denies any true              claudication symptoms. Patient does indicate having back pain. High Risk Factors: Hypertension, Diabetes, current smoker.  Comparison Study: NA Performing Technologist: Gelene Kelly Rdcs,Rvt  Examination Guidelines: A complete evaluation includes at minimum, Doppler waveform signals and systolic blood pressure reading at the level of bilateral brachial, anterior tibial, and posterior tibial arteries, when vessel segments are accessible. Bilateral testing is considered an integral part of a complete examination. Photoelectric Plethysmograph (PPG) waveforms and toe systolic pressure readings are included as required and additional duplex testing as needed. Limited examinations for reoccurring indications may be performed as noted.  ABI Findings: +---------+------------------+-----+---------+--------+ Right    Rt Pressure (mmHg)IndexWaveform Comment  +---------+------------------+-----+---------+--------+ Brachial 136                                      +---------+------------------+-----+---------+--------+ PTA      147               1.08 triphasic         +---------+------------------+-----+---------+--------+ DP       146               1.07 triphasic         +---------+------------------+-----+---------+--------+ Great Toe101               0.74                   +---------+------------------+-----+---------+--------+ +---------+------------------+-----+---------+-------+ Left     Lt Pressure (mmHg)IndexWaveform Comment +---------+------------------+-----+---------+-------+ Brachial 135                                     +---------+------------------+-----+---------+-------+ PTA      146               1.07 triphasic         +---------+------------------+-----+---------+-------+ DP       124  0.91 triphasic        +---------+------------------+-----+---------+-------+ Great Toe126               0.93 Normal           +---------+------------------+-----+---------+-------+ +-------+-----------+-----------+------------+------------+ ABI/TBIToday's ABIToday's TBIPrevious ABIPrevious TBI +-------+-----------+-----------+------------+------------+ Right  1.08       0.74                                +-------+-----------+-----------+------------+------------+ Left   1.07       0.93                                +-------+-----------+-----------+------------+------------+   Summary: Right: Resting right ankle-brachial index is within normal range. The right toe-brachial index is normal. Left: Resting left ankle-brachial index is within normal range. The left toe-brachial index is normal. *See table(s) above for measurements and observations.  Electronically signed by Armida Lander MD on 08/13/2023 at 11:12:48 AM.    Final    CT Chest Wo Contrast Result Date: 08/12/2023 CLINICAL DATA:  Lung nodule follow-up EXAM: CT CHEST WITHOUT CONTRAST TECHNIQUE: Multidetector CT imaging of the chest was performed following the standard protocol without IV contrast. RADIATION DOSE REDUCTION: This exam was performed according to the departmental dose-optimization program which includes automated exposure control, adjustment of the mA and/or kV according to patient size and/or use of iterative reconstruction technique. COMPARISON:  CT 10/17/2022, 07/25/2020 FINDINGS: Cardiovascular: Limited evaluation without intravenous contrast. Nonaneurysmal aorta. Coronary vascular calcification. Normal cardiac size. No pericardial effusion Mediastinum/Nodes: Patent trachea. No thyroid mass. Increased precarinal node, measures 13 mm compared with 10 mm previously. Increased subcarinal nodes, measures 11 mm. Esophagus  within normal limits. Slight increase in size of right cardio phrenic nodes but subcentimeter. Lungs/Pleura: No acute airspace disease, pleural effusion or pneumothorax. Similar background bronchiolitis type changes. Multiple pulmonary nodules are noted. Some of these have decreased, for example previously noted 5 mm right upper lobe pulmonary nodule on the previous exam has resolved, however persistent 6 mm mm right upper lobe pulmonary nodule on series 3, image 66, may be slightly more prominent. Right middle lobe ground-glass focus measuring 10 x 11 mm on series 3, image 83. 4 mm right middle lobe pulmonary nodule on series 3, image 73, previously 2 mm. Upper Abdomen: Liver cirrhosis. Incompletely visualized splenomegaly Musculoskeletal: No acute or suspicious osseous abnormality. IMPRESSION: 1. Multiple bilateral pulmonary nodules are again visualized, many have decreased compared to the prior CT, however possible slight interval increase in size of right upper lobe pulmonary nodule and new focus of focal ground-glass density at the right middle lobe described above. Continued CT surveillance in 6-12 months is recommended. 2. Mild diffuse increase in mediastinal lymph nodes which may also be reassessed at CT follow-up 3. Liver cirrhosis and splenomegaly Electronically Signed   By: Esmeralda Hedge M.D.   On: 08/12/2023 00:31    Recent Labs: Lab Results  Component Value Date   WBC 10.1 07/06/2023   HGB 15.1 (H) 07/06/2023   PLT 208 07/06/2023   NA 129 (L) 01/22/2023   K 4.3 01/22/2023   CL 96 01/22/2023   CO2 26 01/22/2023   GLUCOSE 319 (H) 01/22/2023   BUN 5 (L) 01/22/2023   CREATININE 0.68 01/22/2023   BILITOT 1.1 01/16/2023   ALKPHOS 149 (H) 01/16/2023   AST 57 (H) 01/16/2023   ALT 42 (H) 01/16/2023  PROT 7.5 01/16/2023   ALBUMIN 3.9 01/16/2023   CALCIUM  8.8 01/22/2023   GFRAA 129 06/01/2018    Speciality Comments: No specialty comments available.  Procedures:  No procedures  performed Allergies: Bactrim  [sulfamethoxazole -trimethoprim ], Ciprofloxacin, Clindamycin /lincomycin, Flagyl  [metronidazole ], Hydrocodone, Iodinated contrast media, Ondansetron, Liraglutide , Doxycycline , and Penicillins   Assessment / Plan:     Visit Diagnoses: No diagnosis found.  ***  Orders: No orders of the defined types were placed in this encounter.  No orders of the defined types were placed in this encounter.    Follow-Up Instructions: No follow-ups on file.   Matt Song, MD  Note - This record has been created using [employer].  Chart creation errors have been sought, but may not always  have been located. Such creation errors do not reflect on  the standard of medical care.

## 2023-08-25 NOTE — Progress Notes (Unsigned)
 Office Visit Note  Patient: Emily [name]             Date of Birth: [birth year]           MRN: [id number]             PCP: Francenia Ingle, NP Referring: Francenia Ingle, NP Visit Date: 08/26/2023   Subjective:  No chief complaint on file.   History of Present Illness: Emily [name] is a 43 y.o. female here for follow up ***   Previous HPI 08/13/23 Emily [name] is a 43 year old female with cirrhosis and joint pain who presents with chronic joint pain and abnormal labs including positive ANA and smooth muscle antibodies.   She experiences joint pain and stiffness primarily in her knees, back, legs, and toes. The pain occurs daily, especially upon waking and during activities like sweeping. She has had fluid drawn from her right knee and received cortisone injections in both knees about a month ago, which initially helped but caused her heart to flutter. The pain in her knees persists, with swelling only in the knees.   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 She experiences frequent nausea and vomiting, as well as gallstones. She is unable to take many over-the-counter anti-inflammatory medications but occasionally takes naproxen , which she finds helpful. She last took two naproxen  tablets a few days ago.   She has a positive ANA test with a titer of 1:320, raising concerns about potential autoimmune conditions. No new or changing skin rashes, but she notes darker areas on her cheeks that have worsened over time. She experiences sun sensitivity, with her skin burning easily. Her mother has a history of sarcoidosis.   No significant circulation problems, though her left foot swells if she is on it for a long time. No varicose veins, mouth or nose ulcers, hair loss, or significant sun sensitivity beyond burning. She has a history of strep throat treated with steroids about a month ago, which made her feel hot  and 'crazy'.   She has a history of smoking and drinking, which she attributes to the loss of her teeth. She is currently on SSI for her mental state and cirrhosis. She takes thyroid medication (levothyroxine  75 mcg) for hypothyroidism, which she has been on for years.       Labs reviewed 2023 ASMA 36   2020 RNP >8.0    No Rheumatology ROS completed.   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   Bulimia nervosa 06/15/2023   Borderline personality disorder (HCC) 06/15/2023   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 06/15/2023   Chronic prescription benzodiazepine use 06/15/2023   History of alcohol use disorder in sustained remission 06/15/2023   Polypharmacy 06/15/2023   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   Caffeine-induced insomnia with snoring 06/15/2023   Irregular bleeding 06/02/2023   Pelvic cramping 06/02/2023   Urinary frequency 06/02/2023   Pregnancy examination or test, negative result 06/02/2023   Missed periods 06/02/2023   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   Sebaceous cyst of labia 02/03/2023   Cyst of left ovary 08/11/2022  ASCUS with positive high risk HPV cervical 08/11/2022   Moody 07/30/2022   Hot flashes 07/30/2022   Friable cervix 07/30/2022   Spotting 07/30/2022   Amenorrhea, secondary 07/30/2022   Bartholin cyst 04/30/2022   Irregular periods 04/30/2022   Dental caries 03/11/2022   Tick bite of back 12/20/2020   Chronic bronchitis (HCC) 07/09/2020   Breast tenderness 09/20/2018   Gastroesophageal reflux disease 09/16/2018   Shortness of breath 08/05/2018    Abnormal finding on lung imaging 08/05/2018   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   Swollen lymph nodes 01/06/2017   RUQ pain 10/08/2016   LLQ pain 10/08/2016   Menstrual period late 10/08/2016   Cyst of right Bartholin's gland 12/17/2015   Recurrent boils 06/18/2015   Type 2 diabetes mellitus with complication, without long-term current use of insulin  (HCC) 04/18/2015   Ingrowing toenail 04/18/2015   Vitamin D  deficiency 04/18/2015   Angular cheilitis 04/18/2015   Dyslipidemia 04/18/2015   Diabetes mellitus with complication in adult patient (HCC) 02/20/2015   Smoker 02/20/2015   Essential hypertension 02/20/2015   Pain in limb 02/20/2015   Myalgia 02/20/2015   Hypothyroidism 02/20/2015   Leg pain, inferior 02/20/2015   Toenail deformity 02/20/2015   Elevated liver enzymes 01/07/2012   Abnormal CT of liver 09/30/2011   Fatty liver 09/30/2011   G E REFLUX 11/30/2007   CHEST PAIN-UNSPECIFIED 11/30/2007   HEADACHE 10/21/2007   DYSPNEA 09/15/2007   UNSPECIFIED TACHYCARDIA 06/01/2007   COUGH 05/24/2007   TOBACCO ABUSE 05/20/2007   Pulmonary nodules 05/12/2007   DEPRESSION 04/19/2007   Asthma 04/19/2007    Past Medical History:  Diagnosis Date   Abnormal CT of liver    Anxiety    Asthma    Asthma    Bipolar 1 disorder (HCC)    Bipolar disorder (HCC)    Chronic pain    Cirrhosis (HCC)    Depression    Diabetes mellitus without complication (HCC)    Fatty liver    Gallstones    GERD (gastroesophageal reflux disease)    Headache(784.0)    Hyperlipidemia    Hypertension    Hypothyroidism    Lupus (systemic lupus erythematosus) (HCC)    Pulmonary nodule    Tachycardia    Thyroid disease    Vitamin D  deficiency     Family History  Problem Relation Age of Onset   Asthma Mother    Valvular heart disease Father        Mitral valve repair Dr. Alva Jewels 2007   Diabetes Maternal Grandmother    Hypertension Maternal Grandmother    Past Surgical History:  Procedure  Laterality Date   DILATION AND CURETTAGE OF UTERUS     1 yr ago   UPPER GI ENDOSCOPY  08/28/2022   Social History   Social History Narrative   Not on file   Immunization History  Administered Date(s) Administered   Influenza Split 03/22/2014   Influenza Whole 01/13/2008   Influenza, Quadrivalent, Recombinant, Inj, Pf 01/23/2022   Influenza, Seasonal, Injecte, Preservative Fre 04/18/2015   Influenza,inj,Quad PF,6+ Mos 04/18/2015, 01/08/2022   PFIZER(Purple Top)SARS-COV-2 Vaccination 03/12/2020, 04/16/2020   Pneumococcal Polysaccharide-23 05/10/2012   Tdap 01/02/2006, 08/08/2015     Objective: Vital Signs: LMP  (LMP Unknown)    Physical Exam   Musculoskeletal Exam: ***  CDAI Exam: CDAI Score: -- Patient Global: --; Provider Global: -- Swollen: --; Tender: -- Joint Exam 08/26/2023   No joint exam has been documented for this visit   There is currently  no information documented on the homunculus. Go to the Rheumatology activity and complete the homunculus joint exam.  Investigation: No additional findings.  Imaging: DG Chest 2 View Result Date: 08/24/2023 CLINICAL DATA:  Shortness of breath. EXAM: CHEST - 2 VIEW COMPARISON:  Chest radiograph dated 06/26/2023 FINDINGS: The heart size and mediastinal contours are within normal limits. Both lungs are clear. The visualized skeletal structures are unremarkable. IMPRESSION: No active cardiopulmonary disease. Electronically Signed   By: Angus Bark M.D.   On: 08/24/2023 14:44   VAS US  ABI WITH/WO TBI Result Date: 08/13/2023  LOWER EXTREMITY DOPPLER STUDY Patient Name:  DORI CALHOON  Date of Exam:   08/12/2023 Medical Rec #: [id number]          Accession #:    [id number] Date of Birth: [birth year]          Patient Gender: F Patient Age:   86 years Exam Location:  [facility] Procedure:      VAS US  ABI WITH/WO TBI Referring Phys: Rebecca Campus --------------------------------------------------------------------------------   Indications: patient reports 4-5 months of intermittent leg pain in both calves              after walking a few minutes. Patient also reports waking with leg              pain that is resolved with heat ibproufen. Patient denies any true              claudication symptoms. Patient does indicate having back pain. High Risk Factors: Hypertension, Diabetes, current smoker.  Comparison Study: NA Performing Technologist: Gelene Kelly Rdcs,Rvt  Examination Guidelines: A complete evaluation includes at minimum, Doppler waveform signals and systolic blood pressure reading at the level of bilateral brachial, anterior tibial, and posterior tibial arteries, when vessel segments are accessible. Bilateral testing is considered an integral part of a complete examination. Photoelectric Plethysmograph (PPG) waveforms and toe systolic pressure readings are included as required and additional duplex testing as needed. Limited examinations for reoccurring indications may be performed as noted.  ABI Findings: +---------+------------------+-----+---------+--------+ Right    Rt Pressure (mmHg)IndexWaveform Comment  +---------+------------------+-----+---------+--------+ Brachial 136                                      +---------+------------------+-----+---------+--------+ PTA      147               1.08 triphasic         +---------+------------------+-----+---------+--------+ DP       146               1.07 triphasic         +---------+------------------+-----+---------+--------+ Great Toe101               0.74                   +---------+------------------+-----+---------+--------+ +---------+------------------+-----+---------+-------+ Left     Lt Pressure (mmHg)IndexWaveform Comment +---------+------------------+-----+---------+-------+ Brachial 135                                     +---------+------------------+-----+---------+-------+ PTA      146               1.07 triphasic         +---------+------------------+-----+---------+-------+ DP       124  0.91 triphasic        +---------+------------------+-----+---------+-------+ Great Toe126               0.93 Normal           +---------+------------------+-----+---------+-------+ +-------+-----------+-----------+------------+------------+ ABI/TBIToday's ABIToday's TBIPrevious ABIPrevious TBI +-------+-----------+-----------+------------+------------+ Right  1.08       0.74                                +-------+-----------+-----------+------------+------------+ Left   1.07       0.93                                +-------+-----------+-----------+------------+------------+   Summary: Right: Resting right ankle-brachial index is within normal range. The right toe-brachial index is normal. Left: Resting left ankle-brachial index is within normal range. The left toe-brachial index is normal. *See table(s) above for measurements and observations.  Electronically signed by Armida Lander MD on 08/13/2023 at 11:12:48 AM.    Final    CT Chest Wo Contrast Result Date: 08/12/2023 CLINICAL DATA:  Lung nodule follow-up EXAM: CT CHEST WITHOUT CONTRAST TECHNIQUE: Multidetector CT imaging of the chest was performed following the standard protocol without IV contrast. RADIATION DOSE REDUCTION: This exam was performed according to the departmental dose-optimization program which includes automated exposure control, adjustment of the mA and/or kV according to patient size and/or use of iterative reconstruction technique. COMPARISON:  CT 10/17/2022, 07/25/2020 FINDINGS: Cardiovascular: Limited evaluation without intravenous contrast. Nonaneurysmal aorta. Coronary vascular calcification. Normal cardiac size. No pericardial effusion Mediastinum/Nodes: Patent trachea. No thyroid mass. Increased precarinal node, measures 13 mm compared with 10 mm previously. Increased subcarinal nodes, measures 11 mm. Esophagus  within normal limits. Slight increase in size of right cardio phrenic nodes but subcentimeter. Lungs/Pleura: No acute airspace disease, pleural effusion or pneumothorax. Similar background bronchiolitis type changes. Multiple pulmonary nodules are noted. Some of these have decreased, for example previously noted 5 mm right upper lobe pulmonary nodule on the previous exam has resolved, however persistent 6 mm mm right upper lobe pulmonary nodule on series 3, image 66, may be slightly more prominent. Right middle lobe ground-glass focus measuring 10 x 11 mm on series 3, image 83. 4 mm right middle lobe pulmonary nodule on series 3, image 73, previously 2 mm. Upper Abdomen: Liver cirrhosis. Incompletely visualized splenomegaly Musculoskeletal: No acute or suspicious osseous abnormality. IMPRESSION: 1. Multiple bilateral pulmonary nodules are again visualized, many have decreased compared to the prior CT, however possible slight interval increase in size of right upper lobe pulmonary nodule and new focus of focal ground-glass density at the right middle lobe described above. Continued CT surveillance in 6-12 months is recommended. 2. Mild diffuse increase in mediastinal lymph nodes which may also be reassessed at CT follow-up 3. Liver cirrhosis and splenomegaly Electronically Signed   By: Esmeralda Hedge M.D.   On: 08/12/2023 00:31    Recent Labs: Lab Results  Component Value Date   WBC 10.1 07/06/2023   HGB 15.1 (H) 07/06/2023   PLT 208 07/06/2023   NA 129 (L) 01/22/2023   K 4.3 01/22/2023   CL 96 01/22/2023   CO2 26 01/22/2023   GLUCOSE 319 (H) 01/22/2023   BUN 5 (L) 01/22/2023   CREATININE 0.68 01/22/2023   BILITOT 1.1 01/16/2023   ALKPHOS 149 (H) 01/16/2023   AST 57 (H) 01/16/2023   ALT 42 (H) 01/16/2023  PROT 7.5 01/16/2023   ALBUMIN 3.9 01/16/2023   CALCIUM  8.8 01/22/2023   GFRAA 129 06/01/2018    Speciality Comments: No specialty comments available.  Procedures:  No procedures  performed Allergies: Bactrim  [sulfamethoxazole -trimethoprim ], Ciprofloxacin, Clindamycin /lincomycin, Flagyl  [metronidazole ], Hydrocodone, Iodinated contrast media, Ondansetron, Liraglutide , Doxycycline , Penicillins, and Synjardy  xr [empagliflozin-metformin  hcl er]   Assessment / Plan:     Visit Diagnoses: No diagnosis found.  ***  Orders: No orders of the defined types were placed in this encounter.  No orders of the defined types were placed in this encounter.    Follow-Up Instructions: No follow-ups on file.   Matt Song, MD  Note - This record has been created using [employer].  Chart creation errors have been sought, but may not always  have been located. Such creation errors do not reflect on  the standard of medical care.

## 2023-08-26 ENCOUNTER — Ambulatory Visit: Attending: Internal Medicine | Admitting: Internal Medicine

## 2023-08-26 ENCOUNTER — Ambulatory Visit: Admitting: Internal Medicine

## 2023-08-26 ENCOUNTER — Encounter: Payer: Self-pay | Admitting: Internal Medicine

## 2023-08-26 VITALS — BP 114/78 | HR 86 | Resp 14 | Ht 63.5 in | Wt 191.0 lb

## 2023-08-26 DIAGNOSIS — R768 Other specified abnormal immunological findings in serum: Secondary | ICD-10-CM | POA: Diagnosis not present

## 2023-08-26 DIAGNOSIS — M25462 Effusion, left knee: Secondary | ICD-10-CM

## 2023-08-26 LAB — SYNOVIAL FLUID ANALYSIS, COMPLETE
Basophils, %: 0 %
Eosinophils-Synovial: 0 % (ref 0–2)
Lymphocytes-Synovial Fld: 56 % (ref 0–74)
Monocyte/Macrophage: 35 % (ref 0–69)
Neutrophil, Synovial: 9 % (ref 0–24)
Synoviocytes, %: 0 % (ref 0–15)
WBC, Synovial: 640 {cells}/uL — ABNORMAL HIGH (ref <150)

## 2023-08-26 MED ORDER — LIDOCAINE HCL 1 % IJ SOLN
3.0000 mL | INTRAMUSCULAR | Status: AC | PRN
  Administered 2023-08-26: 3 mL

## 2023-08-26 NOTE — Telephone Encounter (Signed)
 Spoke to patient. She just finsined last course of Z Pak. REassured her about CT fidings.  She has cough but advised her to monitor and reassure her. Will see her for followup September 22, 2023

## 2023-08-26 NOTE — Progress Notes (Signed)
 The left knee fluid did not show any evidence that it was swelling due to an autoimmune problem. Based on this and her mostly normal blood test results I don't think we need any scheduled follow up unless for new problems.  Hopefully draining the fluid improves her symptoms significantly. If it does not improve much, or if symptoms quickly return I think she needs to follow up with the orthopedic clinic for further evaluation or to try repeating an injection without steroids like we discussed.

## 2023-08-26 NOTE — Telephone Encounter (Signed)
 I I spoke to her: See her another phone note

## 2023-08-26 NOTE — Telephone Encounter (Signed)
 I spoke to her earlier today and it is already documented.

## 2023-08-27 ENCOUNTER — Telehealth: Payer: Self-pay | Admitting: Family Medicine

## 2023-08-27 ENCOUNTER — Other Ambulatory Visit: Payer: Self-pay

## 2023-08-27 ENCOUNTER — Telehealth: Payer: Self-pay

## 2023-08-27 ENCOUNTER — Telehealth: Payer: Self-pay | Admitting: Internal Medicine

## 2023-08-27 DIAGNOSIS — M25462 Effusion, left knee: Secondary | ICD-10-CM

## 2023-08-27 NOTE — Progress Notes (Unsigned)
 Per result note on 08/26/2023, The left knee fluid did not show any evidence that it was swelling due to an autoimmune problem. Based on this and her mostly normal blood test results Dr. Rodell Citrin does not think we need any scheduled follow up unless for new problems.   Hopefully draining the fluid improves her symptoms significantly. If it does not improve much, or if symptoms quickly return Dr. Rodell Citrin thinks she needs to follow up with the orthopedic clinic for further evaluation or to try repeating an injection without steroids like we discussed. Patient states she needs a referral to orthopedic. Referral placed.

## 2023-08-27 NOTE — Telephone Encounter (Signed)
 Copied from CRM [id number]. Topic: General - Other >> Aug 27, 2023 12:56 PM Emily [name] wrote: Reason for CRM: Patient is calling because she did fluid testing on her knee at the Rheumatology office. They did not tell her why she had fluid. Patient is wanting her pcp to look and see if she can tell her why she would have fluid on her knee

## 2023-08-27 NOTE — Telephone Encounter (Signed)
 Patient contacted the office to inquire more about her knee. Patient states she woke up at 3 am and could hardly move her knee. Patient states her left knee was throbbing. Patient inquired why there if fluid on her knee and where the fluid is coming from. Patient inquired if there is some sort of pill she can take that can be sent to her pharmacy. Patient inquires about the high WBC cell count in the synovial lab. Patient states she is in a lot of pain. Advised the patient we did make a referral for her to Orthopedics. Patient states she still wants to hear from Dr. Rodell Citrin. Please advise.

## 2023-08-27 NOTE — Telephone Encounter (Signed)
 Talked to pt

## 2023-08-27 NOTE — Patient Instructions (Signed)
 Emily [name]  08/27/2023     @PREFPERIOPPHARMACY @   Your procedure is scheduled on  09/02/2023.   Report to [facility] at  1115  A.M.   Call this number if you have problems the morning of surgery:  [phone]  If you experience any cold or flu symptoms such as cough, fever, chills, shortness of breath, etc. between now and your scheduled surgery, please notify us  at the above number.   Remember:         Take 1/2 of your usual night time insulin  dosage the night before your procedure.        DO NOT take any medications for diabetes the morning of your procedure.         Use your nebulizer and your inhaler before you come and bring your rescue inhaler with you.   Do not eat after midnight.   You may drink clear liquids until  0915 am on 09/02/2023.    Clear liquids allowed are:                    Water, Juice (No red color; non-citric and without pulp; diabetics please choose diet or no sugar options), Carbonated beverages (diabetics please choose diet or no sugar options), Clear Tea (No creamer, milk, or cream, including half & half and powdered creamer), Black Coffee Only (No creamer, milk or cream, including half & half and powdered creamer), and Clear Sports drink (No red color; diabetics please choose diet or no sugar options)          At 0915 am on 09/02/2023 drink 1-12 G2. You cannot have anything else to drink after this.    Take these medicines the morning of surgery with A SIP OF WATER        amlodipine , clonazepam, lamotrigine, levothyroxine , omeprazole , paxil.    Do not wear jewelry, make-up or nail polish, including gel polish,  artificial nails, or any other type of covering on natural nails (fingers and  toes).  Do not wear lotions, powders, or perfumes, or deodorant.  Do not shave 48 hours prior to surgery.  Men may shave face and neck.  Do not bring valuables to the hospital.  [facility] is not responsible for any belongings or  valuables.  Contacts, dentures or bridgework may not be worn into surgery.  Leave your suitcase in the car.  After surgery it may be brought to your room.  For patients admitted to the hospital, discharge time will be determined by your treatment team.  Patients discharged the day of surgery will not be allowed to drive home and must have someone with them for 24 hours.    Special instructions:   DO NOT smoke tobacco or vape for 24 hours before your procedure.  Please read over the following fact sheets that you were given. Pain Booklet, Coughing and Deep Breathing, Surgical Site Infection Prevention, Anesthesia Post-op Instructions, and Care and Recovery After Surgery       Endometrial Ablation: What to Know After After an endometrial ablation, it's common to want to pee more often than usual for the first 24 hours. You'll also have cramps that feel like period cramps. This will last a few days. You may also have: A thin, watery fluid from your vagina. The fluid may be light pink or brown. This may last for a few weeks. A feeling that you may throw up. Bleeding from the vagina that can  last for 4-6 weeks. Follow these instructions at home: Medicines  Take your medicines only as told. If you were given antibiotics, take them as told. Do not stop taking them even if you start to feel better. You may need to take steps to help treat or prevent trouble pooping (constipation), such as: Taking medicines to help you poop. Eating foods high in fiber, like beans, whole grains, and fresh fruits and vegetables. Drinking more fluids as told. Ask your health care provider if it's safe to drive or use machines while taking your medicine. Activity Ask if it's OK for you to lift. Ask what things are safe for you to do at home. Ask when you can go back to work or school. General instructions Check your vagina area every day for signs of infection. Check for: Redness, swelling, or more  pain. More blood coming from your vagina. A bad-smelling fluid. Do not take baths, swim, or use a hot tub until you're told it's OK. Ask if you can shower. Do not have sex or insert anything into your vagina until your provider says it's safe. Keep all follow-up visits. Your provider will watch your condition closely. They may change your treatment over time. Contact a health care provider if: You have a fever or chills. You have redness, swelling, or more pain in your vagina. You have a bad-smelling fluid from your vagina. You have abnormal fluid or bleeding from your vagina that's getting worse. You have trouble peeing. Get help right away if: You have heavy or bright red bleeding that soaks a pad in less than 2 hours. You pass blood clots that are larger than 1 inch (2.5 cm) in diameter. This information is not intended to replace advice given to you by your health care provider. Make sure you discuss any questions you have with your health care provider. Document Revised: 09/23/2022 Document Reviewed: 09/23/2022 Elsevier Patient Education  2024 [employer].General Anesthesia, Adult, Care After The following information offers guidance on how to care for yourself after your procedure. Your health care provider may also give you more specific instructions. If you have problems or questions, contact your health care provider. What can I expect after the procedure? After the procedure, it is common for people to: Have pain or discomfort at the IV site. Have nausea or vomiting. Have a sore throat or hoarseness. Have trouble concentrating. Feel cold or chills. Feel weak, sleepy, or tired (fatigue). Have soreness and body aches. These can affect parts of the body that were not involved in surgery. Follow these instructions at home: For the time period you were told by your health care provider:  Rest. Do not participate in activities where you could fall or become injured. Do not  drive or use machinery. Do not drink alcohol. Do not take sleeping pills or medicines that cause drowsiness. Do not make important decisions or sign legal documents. Do not take care of children on your own. General instructions Drink enough fluid to keep your urine pale yellow. If you have sleep apnea, surgery and certain medicines can increase your risk for breathing problems. Follow instructions from your health care provider about wearing your sleep device: Anytime you are sleeping, including during daytime naps. While taking prescription pain medicines, sleeping medicines, or medicines that make you drowsy. Return to your normal activities as told by your health care provider. Ask your health care provider what activities are safe for you. Take over-the-counter and prescription medicines only as told by your  health care provider. Do not use any products that contain nicotine  or tobacco. These products include cigarettes, chewing tobacco, and vaping devices, such as e-cigarettes. These can delay incision healing after surgery. If you need help quitting, ask your health care provider. Contact a health care provider if: You have nausea or vomiting that does not get better with medicine. You vomit every time you eat or drink. You have pain that does not get better with medicine. You cannot urinate or have bloody urine. You develop a skin rash. You have a fever. Get help right away if: You have trouble breathing. You have chest pain. You vomit blood. These symptoms may be an emergency. Get help right away. Call 911. Do not wait to see if the symptoms will go away. Do not drive yourself to the hospital. Summary After the procedure, it is common to have a sore throat, hoarseness, nausea, vomiting, or to feel weak, sleepy, or fatigue. For the time period you were told by your health care provider, do not drive or use machinery. Get help right away if you have difficulty breathing, have  chest pain, or vomit blood. These symptoms may be an emergency. This information is not intended to replace advice given to you by your health care provider. Make sure you discuss any questions you have with your health care provider. Document Revised: 07/05/2021 Document Reviewed: 07/05/2021 Elsevier Patient Education  2024 Elsevier Inc.How to Use Chlorhexidine at Home in the Shower Chlorhexidine gluconate (CHG) is a germ-killing (antiseptic) wash that's used to clean the skin. It can get rid of the germs that normally live on the skin and can keep them away for about 24 hours. If you're having surgery, you may be told to shower with CHG at home the night before surgery. This can help lower your risk for infection. To use CHG wash in the shower, follow the steps below. Supplies needed: CHG body wash. Clean washcloth. Clean towel. How to use CHG in the shower Follow these steps unless you're told to use CHG in a different way: Start the shower. Use your normal soap and shampoo to wash your face and hair. Turn off the shower or move out of the shower stream. Pour CHG onto a clean washcloth. Do not use any type of brush or rough sponge. Start at your neck, washing your body down to your toes. Make sure you: Wash the part of your body where the surgery will be done for at least 1 minute. Do not scrub. Do not use CHG on your head or face unless your health care provider tells you to. If it gets into your ears or eyes, rinse them well with water. Do not wash your genitals with CHG. Wash your back and under your arms. Make sure to wash skin folds. Let the CHG sit on your skin for 1-2 minutes or as long as told. Rinse your entire body in the shower, including all body creases and folds. Turn off the shower. Dry off with a clean towel. Do not put anything on your skin afterward, such as powder, lotion, or perfume. Put on clean clothes or pajamas. If it's the night before surgery, sleep in clean  sheets. General tips Use CHG only as told, and follow the instructions on the label. Use the full amount of CHG as told. This is often one bottle. Do not smoke and stay away from flames after using CHG. Your skin may feel sticky after using CHG. This is normal. The sticky  feeling will go away as the CHG dries. Do not use CHG: If you have a chlorhexidine allergy or have reacted to chlorhexidine in the past. On open wounds or areas of skin that have broken skin, cuts, or scrapes. On babies younger than 51 months of age. Contact a health care provider if: You have questions about using CHG. Your skin gets irritated or itchy. You have a rash after using CHG. You swallow any CHG. Call your local poison control center [phone] in the [place] Your eyes itch badly, or they become very red or swollen. Your hearing changes. You have trouble seeing. If you can't reach your provider, go to an urgent care or emergency room. Do not drive yourself. Get help right away if: You have swelling or tingling in your mouth or throat. You make high-pitched whistling sounds when you breathe, most often when you breathe out (wheeze). You have trouble breathing. These symptoms may be an emergency. Call 911 right away. Do not wait to see if the symptoms will go away. Do not drive yourself to the hospital. This information is not intended to replace advice given to you by your health care provider. Make sure you discuss any questions you have with your health care provider. Document Revised: 10/21/2022 Document Reviewed: 10/17/2021 Elsevier Patient Education  2024 [employer].

## 2023-08-27 NOTE — Telephone Encounter (Signed)
 Pt does not want to make an appt with joanna and would like to know why rheum took fluid off her knees. I ask pt to call rheum she also wanted to know the results of fluid

## 2023-08-27 NOTE — Telephone Encounter (Signed)
 Please see synovial fluid result note for details.

## 2023-08-27 NOTE — Telephone Encounter (Signed)
 Patient contacted the office again. Advised the patient Dr. Rodell Citrin is off for the afternoon and we have not received a response. Advised the patient once she gets in with Orthopedics, they will be able to better answer her questions. Advised the patient we would call her when we get a response. Patient verbalized understanding.

## 2023-08-27 NOTE — Telephone Encounter (Signed)
 Attempted to contact the patient and left a message to call the office back.

## 2023-08-27 NOTE — Telephone Encounter (Signed)
 Pt called requesting Addie to call her back in regards to her left knee. Pt stated she was still have extreme pain and there has been no relief after her appointment yesterday.

## 2023-08-28 ENCOUNTER — Emergency Department (HOSPITAL_BASED_OUTPATIENT_CLINIC_OR_DEPARTMENT_OTHER)
Admission: EM | Admit: 2023-08-28 | Discharge: 2023-08-28 | Disposition: A | Discharge status: Home / Self Care | Attending: Emergency Medicine | Admitting: Emergency Medicine

## 2023-08-28 ENCOUNTER — Other Ambulatory Visit: Payer: Self-pay | Admitting: Obstetrics & Gynecology

## 2023-08-28 ENCOUNTER — Telehealth (HOSPITAL_COMMUNITY): Payer: Self-pay

## 2023-08-28 ENCOUNTER — Encounter (HOSPITAL_BASED_OUTPATIENT_CLINIC_OR_DEPARTMENT_OTHER): Payer: Self-pay | Admitting: Emergency Medicine

## 2023-08-28 ENCOUNTER — Telehealth: Payer: Self-pay | Admitting: Internal Medicine

## 2023-08-28 ENCOUNTER — Telehealth: Payer: Self-pay

## 2023-08-28 ENCOUNTER — Other Ambulatory Visit: Payer: Self-pay

## 2023-08-28 ENCOUNTER — Encounter (HOSPITAL_COMMUNITY)
Admission: RE | Admit: 2023-08-28 | Discharge: 2023-08-28 | Disposition: A | Discharge status: Home / Self Care | Source: Ambulatory Visit | Attending: Obstetrics & Gynecology | Admitting: Obstetrics & Gynecology

## 2023-08-28 ENCOUNTER — Emergency Department (HOSPITAL_BASED_OUTPATIENT_CLINIC_OR_DEPARTMENT_OTHER)

## 2023-08-28 DIAGNOSIS — K7460 Unspecified cirrhosis of liver: Secondary | ICD-10-CM

## 2023-08-28 DIAGNOSIS — Z794 Long term (current) use of insulin: Secondary | ICD-10-CM | POA: Insufficient documentation

## 2023-08-28 DIAGNOSIS — R531 Weakness: Secondary | ICD-10-CM | POA: Diagnosis not present

## 2023-08-28 DIAGNOSIS — E118 Type 2 diabetes mellitus with unspecified complications: Secondary | ICD-10-CM

## 2023-08-28 DIAGNOSIS — Z01818 Encounter for other preprocedural examination: Secondary | ICD-10-CM

## 2023-08-28 DIAGNOSIS — R42 Dizziness and giddiness: Secondary | ICD-10-CM | POA: Diagnosis not present

## 2023-08-28 DIAGNOSIS — R4182 Altered mental status, unspecified: Secondary | ICD-10-CM | POA: Diagnosis not present

## 2023-08-28 DIAGNOSIS — R457 State of emotional shock and stress, unspecified: Secondary | ICD-10-CM | POA: Diagnosis not present

## 2023-08-28 LAB — URINALYSIS, ROUTINE W REFLEX MICROSCOPIC
Bacteria, UA: NONE SEEN
Bilirubin Urine: NEGATIVE
Glucose, UA: NEGATIVE mg/dL
Hgb urine dipstick: NEGATIVE
Ketones, ur: NEGATIVE mg/dL
Nitrite: NEGATIVE
Protein, ur: NEGATIVE mg/dL
Specific Gravity, Urine: 1.005 (ref 1.005–1.030)
pH: 6.5 (ref 5.0–8.0)

## 2023-08-28 LAB — COMPREHENSIVE METABOLIC PANEL WITH GFR
ALT: 31 U/L (ref 0–44)
AST: 75 U/L — ABNORMAL HIGH (ref 15–41)
Albumin: 3.9 g/dL (ref 3.5–5.0)
Alkaline Phosphatase: 224 U/L — ABNORMAL HIGH (ref 38–126)
Anion gap: 11 (ref 5–15)
BUN: <5 mg/dL — ABNORMAL LOW (ref 6–20)
CO2: 26 mmol/L (ref 22–32)
Calcium: 9.5 mg/dL (ref 8.9–10.3)
Chloride: 99 mmol/L (ref 98–111)
Creatinine, Ser: 0.61 mg/dL (ref 0.44–1.00)
GFR, Estimated: >60 mL/min (ref >60)
Glucose, Bld: 184 mg/dL — ABNORMAL HIGH (ref 70–99)
Potassium: 3.6 mmol/L (ref 3.5–5.1)
Sodium: 136 mmol/L (ref 135–145)
Total Bilirubin: 0.9 mg/dL (ref 0.0–1.2)
Total Protein: 7.5 g/dL (ref 6.5–8.1)

## 2023-08-28 LAB — CBC
HCT: 45.8 % (ref 36.0–46.0)
Hemoglobin: 15.5 g/dL — ABNORMAL HIGH (ref 12.0–15.0)
MCH: 32.6 pg (ref 26.0–34.0)
MCHC: 33.8 g/dL (ref 30.0–36.0)
MCV: 96.4 fL (ref 80.0–100.0)
Platelets: 153 10*3/uL (ref 150–400)
RBC: 4.75 MIL/uL (ref 3.87–5.11)
RDW: 13.2 % (ref 11.5–15.5)
WBC: 8.6 10*3/uL (ref 4.0–10.5)
nRBC: 0 % (ref 0.0–0.2)

## 2023-08-28 LAB — PREGNANCY, URINE: Preg Test, Ur: NEGATIVE

## 2023-08-28 LAB — CBG MONITORING, ED: Glucose-Capillary: 228 mg/dL — ABNORMAL HIGH (ref 70–99)

## 2023-08-28 MED ORDER — SODIUM CHLORIDE 0.9 % IV BOLUS
1000.0000 mL | Freq: Once | INTRAVENOUS | Status: AC
  Administered 2023-08-28: 1000 mL via INTRAVENOUS

## 2023-08-28 MED ORDER — PROCHLORPERAZINE EDISYLATE 10 MG/2ML IJ SOLN
10.0000 mg | Freq: Once | INTRAMUSCULAR | Status: AC
  Administered 2023-08-28: 10 mg via INTRAVENOUS
  Filled 2023-08-28: qty 2

## 2023-08-28 MED ORDER — LORAZEPAM 1 MG PO TABS
1.0000 mg | ORAL_TABLET | Freq: Once | ORAL | Status: AC
  Administered 2023-08-28: 1 mg via ORAL
  Filled 2023-08-28: qty 1

## 2023-08-28 NOTE — ED Triage Notes (Signed)
 Patient arrives via EMS from home for dizziness that started at approx 12am. Patient reports her blood sugar was 300 at home. CBG 150 with ems - insulin  taken at 9pm. Patient state she has been fasting tonight for colonscopy tomorrow.

## 2023-08-28 NOTE — Telephone Encounter (Signed)
 Patient contacted the office again stating she wants to know where the fluid on her knee is coming from. Patient states her "WBC in the fluid was elevated and she wants to know why". Advised patient the only information I could provide her is what we already have. Patient advised that she should follow up with the orthopedic doctor as instructed and they may be able to provide her with more information.

## 2023-08-28 NOTE — Telephone Encounter (Signed)
 Patient called stating "she was returning a call from Addie regarding her knee pain."  Advised patient that Addie was not in the office today.  Patient requested Dr. Rodell Citrin return her call.

## 2023-08-28 NOTE — Discharge Instructions (Signed)
 Follow-up with primary doctor if symptoms do not improve in the next few days.

## 2023-08-28 NOTE — Telephone Encounter (Signed)
 Copied from CRM [id number]. Topic: Clinical - Medication Question >> Aug 28, 2023 12:45 PM Emily [name] wrote: Reason for CRM:pt called to speak with her provider , pt is requesting the provider go over her urine labs that was taken at the hospital. Pt states she has crystals in her urine. Please call pt back at [phone]

## 2023-08-28 NOTE — Telephone Encounter (Signed)
 Pt. Called and 2 identifiers used.  She received her  urine results on My Chart and wanted to speak to someone about crystals in her urine.  She also verbalized that she is having abdominal/side pain.  I explained to her that she would need to direct those questions to her PCP and I encouraged the patient to return ED to be evaluated due to having abdominal/side pain. She verbalized understanding and all questions answered.

## 2023-08-28 NOTE — ED Provider Notes (Signed)
 [facility] AT [facility] Provider Note   CSN: [id number] Arrival date & time: 08/28/23  0242     History  Chief Complaint  Patient presents with   Dizziness    Emily [name] is a 43 y.o. female.  Patient is a 43 year old female with past medical history of PTSD, anxiety, panic disorder, depression, borderline personality disorder, cirrhosis.  Patient presenting today for evaluation of "not feeling right.  Patient woke at approximately midnight stating that she feels generally unwell.  She repeatedly tells me that her "head does not feel right".  She denies headache or visual disturbances she denies any numbness.  She also states that her blood sugars have been as high as 300 this evening, but were 150 with EMS.  No fevers or chills.  She is due to undergo a colonoscopy this morning and has been doing the colonoscopy prep this evening.       Home Medications Prior to Admission medications   Medication Sig Start Date End Date Taking? Authorizing Provider  ACCU-CHEK AVIVA PLUS test strip 1 each by Other route 3 (three) times daily. 04/28/20   [provider]  ACCU-CHEK FASTCLIX LANCETS MISC USE AS DIRECTED 12/10/17   Tysinger, Christiane Cowing, PA-C  albuterol  (VENTOLIN  HFA) 108 (90 Base) MCG/ACT inhaler Inhale 2 puffs into the lungs every 6 (six) hours as needed for wheezing or shortness of breath. 08/05/23   Raejean Bullock, NP  amLODipine  (NORVASC ) 5 MG tablet TAKE 1 TABLET (5 MG TOTAL) BY MOUTH DAILY. 03/04/23   Eilleen Grates, MD  benzonatate  (TESSALON ) 100 MG capsule Take 1 capsule (100 mg total) by mouth 3 (three) times daily as needed for cough. Patient not taking: Reported on 08/25/2023 06/24/23   Farris Hong, PA-C  Blood Glucose Monitoring Suppl (ACCU-CHEK AVIVA PLUS) w/Device KIT USE AS DIRECTED 05/01/16   Tysinger, Christiane Cowing, PA-C  Blood Glucose Monitoring Suppl DEVI 1 each by Does not apply route in the morning, at noon, and at bedtime. May  substitute to any manufacturer covered by patient's insurance. 01/22/23   Motwani, Komal, MD  clonazePAM (KLONOPIN) 1 MG tablet Take 1 mg by mouth 2 (two) times daily.    [provider]  Continuous Glucose Receiver (DEXCOM G7 RECEIVER) DEVI 1 Device by Does not apply route continuous. 01/22/23   Motwani, Komal, MD  Continuous Glucose Sensor (DEXCOM G7 SENSOR) MISC 1 Device by Does not apply route continuous. 03/18/23   Motwani, Komal, MD  Empagliflozin-metFORMIN  HCl ER (SYNJARDY  XR) 12.08-998 MG TB24 Take 2 tablets by mouth daily. Patient not taking: Reported on 08/25/2023 05/22/23   Motwani, Komal, MD  fluticasone -salmeterol (ADVAIR) 250-50 MCG/ACT AEPB Inhale 1 puff into the lungs every 12 (twelve) hours. Rinse after use. Patient not taking: Reported on 08/25/2023 07/22/22   Parrett, Macdonald Savoy, NP  insulin  lispro (HUMALOG  KWIKPEN) 100 UNIT/ML KwikPen Max daily 45 units 07/03/23   Shamleffer, Ibtehal Jaralla, MD  Insulin  Pen Needle 32G X 4 MM MISC 1 Device by Does not apply route in the morning, at noon, in the evening, and at bedtime. 07/03/23   Shamleffer, Ibtehal Jaralla, MD  ipratropium-albuterol  (DUONEB) 0.5-2.5 (3) MG/3ML SOLN Take 3 mLs by nebulization in the morning, at noon, and at bedtime. 09/17/22   Parrett, Macdonald Savoy, NP  lamoTRIgine (LAMICTAL) 150 MG tablet Take 150 mg by mouth daily.    [provider]  levothyroxine  (SYNTHROID ) 75 MCG tablet TAKE 1 TABLET BY MOUTH DAILY BEFORE BREAKFAST. 06/23/23  Francenia Ingle, NP  megestrol  (MEGACE ) 40 MG tablet 3 tablets a day for 5 days, 2 tablets a day for 5 days then 1 tablet daily 07/17/23   Wendelyn Halter, MD  montelukast  (SINGULAIR ) 10 MG tablet TAKE 1 TABLET BY MOUTH EVERYDAY AT BEDTIME 07/20/23   Parrett, [name] S, NP  naproxen  (NAPROSYN ) 500 MG tablet Take 1 tablet (500 mg total) by mouth 2 (two) times daily with a meal. Patient taking differently: Take 500 mg by mouth daily as needed for moderate pain (pain score 4-6). 05/15/23    Mardene Shake, FNP  Nebulizers (COMPRESSOR/NEBULIZER) MISC Use as directed 11/15/19   Maire Scot, MD  nitroGLYCERIN  (NITROSTAT ) 0.4 MG SL tablet Place 1 tablet (0.4 mg total) under the tongue every 5 (five) minutes as needed for chest pain. 03/10/22   Nahser, Lela Purple, MD  nystatin  cream (MYCOSTATIN ) Apply to affected area 2 times daily Patient not taking: Reported on 08/25/2023 07/06/23   Early Glisson, MD  omeprazole  (PRILOSEC) 40 MG capsule Take 40 mg by mouth every morning. 08/05/21   [provider]  PARoxetine (PAXIL) 10 MG tablet Take 10 mg by mouth at bedtime. 05/27/23   [provider]  predniSONE  (DELTASONE ) 20 MG tablet 60 mg x3d, 40 mg x3d, 20 mg x2d, 10 mg x2d Patient not taking: Reported on 08/25/2023 06/17/23   Napolean Backbone A, DO  promethazine (PHENERGAN) 12.5 MG tablet Take 12.5 mg by mouth every 8 (eight) hours as needed for vomiting or nausea. 09/24/21   [provider]  propranolol  (INDERAL ) 20 MG tablet Take 1 tablet (20 mg total) by mouth 3 (three) times daily. 03/31/23   Eilleen Grates, MD  pseudoephedrine  (SUDAFED) 120 MG 12 hr tablet Take 1 tablet (120 mg total) by mouth 2 (two) times daily. Patient not taking: Reported on 08/25/2023 06/11/23   Wanita Gutta, PA-C  Respiratory Therapy Supplies (NEBULIZER/TUBING/MOUTHPIECE) KIT Use as directed 11/15/19   Maire Scot, MD  rosuvastatin  (CRESTOR ) 5 MG tablet Take 1 tablet (5 mg total) by mouth daily. 07/23/23   Motwani, Komal, MD  solifenacin  (VESICARE ) 5 MG tablet Take 1 tablet (5 mg total) by mouth daily. Patient not taking: Reported on 08/25/2023 07/02/23 07/01/24  Lendia Quay A, NP  Vitamin D , Ergocalciferol , (DRISDOL ) 1.25 MG (50000 UNIT) CAPS capsule Take 50,000 Units by mouth every 7 (seven) days. 07/23/23   [provider]  ziprasidone (GEODON) 60 MG capsule Take 60 mg by mouth at bedtime. 05/22/20   [provider]      Allergies    Bactrim  [sulfamethoxazole -trimethoprim ],  Ciprofloxacin, Clindamycin /lincomycin, Flagyl  [metronidazole ], Hydrocodone, Iodinated contrast media, Ondansetron, Liraglutide , Doxycycline , Penicillins, and Synjardy  xr [empagliflozin-metformin  hcl er]    Review of Systems   Review of Systems  All other systems reviewed and are negative.   Physical Exam Updated Vital Signs BP (!) 109/92   Pulse 81   Temp 98.1 F (36.7 C) (Oral)   Ht 5' 3.5" (1.613 m)   Wt 86.6 kg   LMP  (LMP Unknown)   SpO2 91%   BMI 33.30 kg/m  Physical Exam Vitals and nursing note reviewed.  Constitutional:      General: She is not in acute distress.    Appearance: She is well-developed. She is not diaphoretic.  HENT:     Head: Normocephalic and atraumatic.  Eyes:     Extraocular Movements: Extraocular movements intact.     Pupils: Pupils are equal, round, and reactive to light.  Cardiovascular:  Rate and Rhythm: Normal rate and regular rhythm.     Heart sounds: No murmur heard.    No friction rub. No gallop.  Pulmonary:     Effort: Pulmonary effort is normal. No respiratory distress.     Breath sounds: Normal breath sounds. No wheezing.  Abdominal:     General: Bowel sounds are normal. There is no distension.     Palpations: Abdomen is soft.     Tenderness: There is no abdominal tenderness.  Musculoskeletal:        General: Normal range of motion.     Cervical back: Normal range of motion and neck supple.  Skin:    General: Skin is warm and dry.  Neurological:     General: No focal deficit present.     Mental Status: She is alert and oriented to person, place, and time. Mental status is at baseline.     Cranial Nerves: No cranial nerve deficit.     Sensory: No sensory deficit.     Motor: No weakness.     Coordination: Coordination normal.     ED Results / Procedures / Treatments   Labs (all labs ordered are listed, but only abnormal results are displayed) Labs Reviewed  CBC - Abnormal; Notable for the following components:       Result Value   Hemoglobin 15.5 (*)    All other components within normal limits  URINALYSIS, ROUTINE W REFLEX MICROSCOPIC - Abnormal; Notable for the following components:   Leukocytes,Ua MODERATE (*)    Crystals PRESENT (*)    All other components within normal limits  CBG MONITORING, ED - Abnormal; Notable for the following components:   Glucose-Capillary 228 (*)    All other components within normal limits  PREGNANCY, URINE  COMPREHENSIVE METABOLIC PANEL WITH GFR    EKG ED ECG REPORT   Date: 08/28/2023  Rate: 111  Rhythm: sinus tachycardia  QRS Axis: left  Intervals: normal  ST/T Wave abnormalities: normal  Conduction Disutrbances:none  Narrative Interpretation:   Old EKG Reviewed: unchanged  I have personally reviewed the EKG tracing and agree with the computerized printout as noted.   Radiology No results found.  Procedures Procedures    Medications Ordered in ED Medications  sodium chloride 0.9 % bolus 1,000 mL (has no administration in time range)    ED Course/ Medical Decision Making/ A&P  Patient is a 43 year old female presenting with complaints of generalized malaise starting at approximately midnight.  She is currently in the process of drinking a bowel prep for her colonoscopy which is scheduled for this afternoon.  Patient arrives with stable vital signs and is afebrile.  Physical examination is unremarkable.  Laboratory studies obtained including CBC and CMP.  Glucose is 184.  Urinalysis inconsistent with infection.  CT scan of the head obtained showing no acute intracranial process.  At this point, because of the patient's symptoms is undetermined, but nothing appears emergent.  Perhaps she is having some sort of reaction to her bowel prep.  Either way, I feel as though patient can safely be discharged and have not identified anything that requires admission or intervention.  Final Clinical Impression(s) / ED Diagnoses Final diagnoses:  None     Rx / DC Orders ED Discharge Orders     None         Orvilla Blander, MD 08/28/23 [phone]

## 2023-08-28 NOTE — Telephone Encounter (Signed)
 Copied from CRM [id number] [id number]. Topic: General - Other >> Aug 28, 2023 10:03 AM Emily [name] wrote: Reason for CRM: Pt was seen in the ER yesterday and called in wanting to know if she can be referred out for an Endo/Flex, also wanted NP Kingsley Penny to take a look at blood work she had done while admitted to the ER. Decl scheduling an ER follow up

## 2023-08-28 NOTE — Telephone Encounter (Signed)
 Noted talked to pt.

## 2023-08-30 ENCOUNTER — Other Ambulatory Visit: Payer: Self-pay | Admitting: Obstetrics & Gynecology

## 2023-08-31 ENCOUNTER — Encounter (HOSPITAL_COMMUNITY): Payer: Self-pay

## 2023-08-31 ENCOUNTER — Encounter (HOSPITAL_COMMUNITY): Admission: RE | Admit: 2023-08-31 | Source: Ambulatory Visit

## 2023-08-31 ENCOUNTER — Other Ambulatory Visit: Payer: Self-pay | Admitting: "Endocrinology

## 2023-08-31 ENCOUNTER — Encounter: Payer: Self-pay | Admitting: Internal Medicine

## 2023-08-31 DIAGNOSIS — E1165 Type 2 diabetes mellitus with hyperglycemia: Secondary | ICD-10-CM

## 2023-08-31 DIAGNOSIS — J45909 Unspecified asthma, uncomplicated: Secondary | ICD-10-CM

## 2023-08-31 NOTE — Telephone Encounter (Signed)
 Requested Prescriptions   Pending Prescriptions Disp Refills   Continuous Glucose Sensor (DEXCOM G7 SENSOR) MISC [Pharmacy Med Name: DEXCOM G7 SENSOR] 9 each 0    Sig: USE AS DIRECTED

## 2023-09-01 ENCOUNTER — Inpatient Hospital Stay (HOSPITAL_COMMUNITY): Admission: RE | Admit: 2023-09-01 | Source: Ambulatory Visit

## 2023-09-01 MED ORDER — IPRATROPIUM-ALBUTEROL 0.5-2.5 (3) MG/3ML IN SOLN: 3.0000 mL | Freq: Three times a day (TID) | RESPIRATORY_TRACT | 11 refills | Status: DC

## 2023-09-01 NOTE — Progress Notes (Signed)
 Pt notified procedure canceled for tomorrow due to no water in hospital. Voiced understanding.

## 2023-09-02 ENCOUNTER — Encounter (HOSPITAL_COMMUNITY): Admission: RE | Payer: Self-pay | Source: Home / Self Care

## 2023-09-02 ENCOUNTER — Encounter: Payer: Self-pay | Admitting: Radiology

## 2023-09-02 ENCOUNTER — Other Ambulatory Visit (INDEPENDENT_AMBULATORY_CARE_PROVIDER_SITE_OTHER): Payer: Self-pay

## 2023-09-02 ENCOUNTER — Telehealth: Payer: Self-pay | Admitting: Radiology

## 2023-09-02 ENCOUNTER — Ambulatory Visit (HOSPITAL_COMMUNITY): Admission: RE | Admit: 2023-09-02 | Source: Home / Self Care | Admitting: Obstetrics & Gynecology

## 2023-09-02 ENCOUNTER — Ambulatory Visit: Admitting: Surgical

## 2023-09-02 ENCOUNTER — Encounter: Payer: Self-pay | Admitting: Surgical

## 2023-09-02 DIAGNOSIS — M25561 Pain in right knee: Secondary | ICD-10-CM

## 2023-09-02 DIAGNOSIS — M545 Low back pain, unspecified: Secondary | ICD-10-CM | POA: Diagnosis not present

## 2023-09-02 DIAGNOSIS — G8929 Other chronic pain: Secondary | ICD-10-CM | POA: Diagnosis not present

## 2023-09-02 DIAGNOSIS — M25562 Pain in left knee: Secondary | ICD-10-CM

## 2023-09-02 SURGERY — ABLATION, ENDOMETRIUM, HYSTEROSCOPIC
Anesthesia: General

## 2023-09-02 NOTE — Telephone Encounter (Signed)
 Please see message from [place] office and advise.    She called and wants to know if she can get something for pain.  She uses [facility] in [place] and her number is [phone]

## 2023-09-02 NOTE — Progress Notes (Signed)
 Office Visit Note   Patient: Emily [name]           Date of Birth: [birth year]           MRN: [id number] Visit Date: 09/02/2023 Requested by: Matt Song, MD [address] [place],  [place] [address] PCP: Francenia Ingle, NP  Subjective: Chief Complaint  Patient presents with  . Left Knee - Pain    Fall 09/01/2023  . Right Knee - Pain    Fall 09/01/2023    HPI: Emily [name] is a 43 y.o. female who presents to the office reporting left knee pain.  Patient has history of left knee pain that has been ongoing for several months without injury causing onset.  She describes left knee pain with mechanical symptoms.  She has fairly diffuse pain without radiation.  No groin pain.  No radicular pain or numbness/tingling.  She does report new fall yesterday where she slipped on some mud and fell on both knees.  The pain is a little worse since then but she is still able to bear weight and walk around.  She has a little bit of increased low back pain that is new for her since the fall but this is tolerable and not very concerning for her.  She also notes chronic right knee pain with increased popping compared with the left knee.  She has not had surgery on either knee.  She has had cortisone injection in the right knee but not the left.  Cortisone injection unfortunately caused a lot of heart palpitations for her and that was very distressing so she would not like to repeat cortisone injection in the future.  She has had left knee aspirated by Dr. Rodell Citrin with rheumatology and his impression was no significant rheumatologic problem as the source of her left knee pain based on her fluid results..                ROS: All systems reviewed are negative as they relate to the chief complaint within the history of present illness.  Patient denies fevers or chills.  Assessment & Plan: Visit Diagnoses:  1. Acute bilateral knee pain   2. Acute bilateral low back pain, unspecified  whether sciatica present     Plan: Plan at this time is order MRI of the left knee for further evaluation of persistent knee pain that has not responded to medications or aspiration.  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  Follow-up after MRI to review results.  Follow-Up Instructions: No follow-ups on file.   Orders:  Orders Placed This Encounter  Procedures  . XR KNEE 3 VIEW RIGHT  . XR KNEE 3 VIEW LEFT  . XR Lumbar Spine 2-3 Views   No orders of the defined types were placed in this encounter.     Procedures: No procedures performed   Clinical Data: No additional findings.  Objective: Vital Signs: LMP  (LMP Unknown)   Physical Exam:  Constitutional: Patient appears well-developed HEENT:  Head: Normocephalic Eyes:EOM are normal Neck: Normal range of motion Cardiovascular: Normal rate Pulmonary/chest: Effort normal Neurologic: Patient is alert Skin: Skin is warm Psychiatric: Patient has normal mood and affect  Ortho Exam: Ortho exam demonstrates left knee with small effusion present.  Tenderness over the lateral joint line moderately.  No tenderness over the medial joint line.  She has no calf tenderness.  Negative Homans' sign.  Palpable DP pulse.  Intact ankle dorsiflexion plantarflexion.  Able to perform straight leg raise without extensor lag.  No pain with hip range of motion.  Stable to anterior posterior drawer sign.  Stable to varus and valgus stress at 0 and 30 degrees.  Specialty Comments:  No specialty comments available.  Imaging: No results found.   PMFS History: Patient Active Problem List   Diagnosis Date Noted  . Effusion, left knee 08/26/2023  . Positive ANA (antinuclear antibody) 08/13/2023  . Menorrhagia with irregular cycle 07/08/2023  . Hepatic cirrhosis (HCC) 07/08/2023  . PTSD (post-traumatic  stress disorder) 06/15/2023  . Generalized anxiety disorder 06/15/2023  . Panic disorder with agoraphobia 06/15/2023  . 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 Bulimia nervosa 06/15/2023  . Borderline personality disorder (HCC) 06/15/2023  . 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 06/15/2023  . Chronic prescription benzodiazepine use 06/15/2023  . History of alcohol use disorder in sustained remission 06/15/2023  . Polypharmacy 06/15/2023  . 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  . Caffeine-induced insomnia with snoring 06/15/2023  . Irregular bleeding 06/02/2023  . Pelvic cramping 06/02/2023  . Urinary frequency 06/02/2023  . Pregnancy examination or test, negative result 06/02/2023  . Missed periods 06/02/2023  . 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  . Sebaceous cyst of labia 02/03/2023  . Cyst of left ovary 08/11/2022  . ASCUS with positive high risk HPV cervical 08/11/2022  . Moody 07/30/2022  . Hot flashes 07/30/2022  . Friable cervix 07/30/2022  . Spotting 07/30/2022  . Amenorrhea, secondary 07/30/2022  . Bartholin cyst 04/30/2022  . Irregular periods 04/30/2022  . Dental caries 03/11/2022  . Tick bite of back 12/20/2020  . Chronic bronchitis (HCC) 07/09/2020  . Breast tenderness 09/20/2018  . Gastroesophageal reflux disease 09/16/2018  . Shortness of breath 08/05/2018  . Abnormal finding on lung imaging 08/05/2018  . 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  . Swollen lymph nodes 01/06/2017  . RUQ pain 10/08/2016  . LLQ pain 10/08/2016  . Menstrual period late 10/08/2016  . Cyst of right Bartholin's gland 12/17/2015  . Recurrent boils 06/18/2015  . Type 2 diabetes mellitus with complication, without long-term current use of insulin  (HCC) 04/18/2015  . Ingrowing toenail 04/18/2015  . Vitamin D  deficiency 04/18/2015  . Angular cheilitis 04/18/2015  . Dyslipidemia 04/18/2015  . Diabetes mellitus with complication in adult  patient (HCC) 02/20/2015  . Smoker 02/20/2015  . Essential hypertension 02/20/2015  . Pain in limb 02/20/2015  . Myalgia 02/20/2015  . Hypothyroidism 02/20/2015  . Leg pain, inferior 02/20/2015  . Toenail deformity 02/20/2015  . Elevated liver enzymes 01/07/2012  . Abnormal CT of liver 09/30/2011  . Fatty liver 09/30/2011  . G E REFLUX 11/30/2007  . CHEST PAIN-UNSPECIFIED 11/30/2007  . HEADACHE 10/21/2007  . DYSPNEA 09/15/2007  . UNSPECIFIED TACHYCARDIA 06/01/2007  . COUGH 05/24/2007  . TOBACCO ABUSE 05/20/2007  . Pulmonary nodules 05/12/2007  . DEPRESSION 04/19/2007  . Asthma 04/19/2007   Past Medical History:  Diagnosis Date  . Abnormal CT of liver   . Anxiety   . Asthma   . Asthma   . Bipolar 1 disorder (HCC)   . Bipolar disorder (HCC)   . Chronic pain   . Cirrhosis (HCC)   . Depression   . Diabetes mellitus without complication (HCC)   . Fatty liver   . Gallstones   . GERD (gastroesophageal reflux disease)   . Headache(784.0)   .  Hyperlipidemia   . Hypertension   . Hypothyroidism   . Lupus (systemic lupus erythematosus) (HCC)   . Pulmonary nodule   . Tachycardia   . Thyroid disease   . Vitamin D  deficiency     Family History  Problem Relation Age of Onset  . Asthma Mother   . Valvular heart disease Father        Mitral valve repair Dr. Alva Jewels 2007  . Diabetes Maternal Grandmother   . Hypertension Maternal Grandmother     Past Surgical History:  Procedure Laterality Date  . DILATION AND CURETTAGE OF UTERUS     1 yr ago  . UPPER GI ENDOSCOPY  08/28/2022   Social History   Occupational History  . Not on file  Tobacco Use  . Smoking status: Every Day    Current packs/day: 1.00    Average packs/day: 1 pack/day for 20.0 years (20.0 ttl pk-yrs)    Types: Cigarettes    Passive exposure: Never  . Smokeless tobacco: Never  . Tobacco comments:    Smoking 0.5ppd  Vaping Use  . Vaping status: Never Used  Substance and Sexual Activity  . Alcohol use:  No    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  . Drug use: No  . Sexual activity: Not Currently    Partners: Male    Birth control/protection: Abstinence

## 2023-09-03 ENCOUNTER — Encounter: Payer: Self-pay | Admitting: Obstetrics & Gynecology

## 2023-09-03 ENCOUNTER — Other Ambulatory Visit: Payer: Self-pay | Admitting: Surgical

## 2023-09-03 ENCOUNTER — Telehealth: Payer: Self-pay | Admitting: Surgical

## 2023-09-03 DIAGNOSIS — M25462 Effusion, left knee: Secondary | ICD-10-CM

## 2023-09-03 NOTE — Telephone Encounter (Signed)
 We talked about this patient earlier today and I think we do not want to do any pain medicine until we know what is going on with her knee.  We can follow-up with her after she gets her MRI scan.

## 2023-09-03 NOTE — Telephone Encounter (Signed)
 Patient called and said she is in a lot  of pain and she said both knees are killing her and could it be osteopetrosis? CB#[phone]

## 2023-09-03 NOTE — Telephone Encounter (Signed)
 I left voicemail answering the past 3 questions.   Unable to give determination on what is going on with her knee until after MRI scan.  She needs to have the scan and then we will discuss results.  2.  We do not routinely give pain medicaion unless patient has fracture or is post op.  Advised we are unable to send in anything for pain at this time.  3.  Advised not likely that cirrhosis is causing knee swelling. Usually swelling from this is in the peritoneum or abdomen area.

## 2023-09-04 ENCOUNTER — Ambulatory Visit
Admission: RE | Admit: 2023-09-04 | Discharge: 2023-09-04 | Disposition: A | Discharge status: Home / Self Care | Source: Ambulatory Visit | Attending: Surgical | Admitting: Surgical

## 2023-09-04 ENCOUNTER — Encounter: Payer: Self-pay | Admitting: Surgical

## 2023-09-04 DIAGNOSIS — M25462 Effusion, left knee: Secondary | ICD-10-CM

## 2023-09-05 ENCOUNTER — Encounter: Payer: Self-pay | Admitting: Physician Assistant

## 2023-09-06 ENCOUNTER — Telehealth: Payer: Self-pay | Admitting: Physician Assistant

## 2023-09-06 NOTE — Telephone Encounter (Signed)
 Late entry from Saturday 09/05/23 1:30PM: I returned the patients call from the call on-service.  Patient was experiencing severe pain and a recurrence of swelling involving both knees, left worse than right.  She is currently awaiting MRI results ordered by orthopedics. Patient has had an inadequate response to OTC products.   Intolerance to meloxicam and prednisone --palpitations.   Recommended intra-articular toradol  injection as recommended by Dr. Rodell Citrin.  Patient was encouraged to go to orthopedic urgent care.   Patient was in agreement.   All questions were addressed.  Jacinta Martinis, PA-C

## 2023-09-08 ENCOUNTER — Encounter (HOSPITAL_COMMUNITY): Payer: Self-pay

## 2023-09-08 ENCOUNTER — Ambulatory Visit (HOSPITAL_COMMUNITY)

## 2023-09-08 DIAGNOSIS — R112 Nausea with vomiting, unspecified: Secondary | ICD-10-CM | POA: Diagnosis not present

## 2023-09-08 DIAGNOSIS — K921 Melena: Secondary | ICD-10-CM | POA: Diagnosis not present

## 2023-09-09 ENCOUNTER — Telehealth: Payer: Self-pay | Admitting: Radiology

## 2023-09-09 ENCOUNTER — Ambulatory Visit: Admitting: Surgical

## 2023-09-09 NOTE — Telephone Encounter (Signed)
 Please see message from [place] office below and advise. MRI was to be reviewed. Report available in EPIC.       Pt called  States she had a procedure done for her cirrhosis of the liver and will not be able to make her appointment today.   Please return her call at [phone]

## 2023-09-09 NOTE — Telephone Encounter (Signed)
 I called Emily [name].  She had an appointment for EGD/colonoscopy today.  They are having a liver biopsy in the next few days it sounds like.  Regarding her knee, we discussed that MRI shows large effusion.  She reports she has had about 75 cc drawn off from the knee in the past.  There is no real structural problem on the MRI scan that can explain why she is having such swelling.  Dr. Rodell Citrin did not feel it was rheumatologic.  Think that the best option for her would be to try and aspirate her knee and inject Toradol /Marcaine to see what effect that has for her.  We can also send the fluid off at that time.  Recommended she call the [facility] office and schedule appointment for aspiration/injection.  We can transport some Toradol  between offices before then.

## 2023-09-10 NOTE — Telephone Encounter (Signed)
 Emily [name] aware patient will call to schedule aspiration/injection.  Per [place], they do have toradol  at the [facility] office.

## 2023-09-11 ENCOUNTER — Encounter: Admitting: Obstetrics & Gynecology

## 2023-09-15 ENCOUNTER — Telehealth

## 2023-09-15 ENCOUNTER — Encounter: Payer: Self-pay | Admitting: Internal Medicine

## 2023-09-15 ENCOUNTER — Ambulatory Visit: Payer: Self-pay

## 2023-09-15 ENCOUNTER — Telehealth: Admitting: Physician Assistant

## 2023-09-15 DIAGNOSIS — J069 Acute upper respiratory infection, unspecified: Secondary | ICD-10-CM | POA: Diagnosis not present

## 2023-09-15 NOTE — Telephone Encounter (Signed)
 Copied from CRM [id number]. Topic: Clinical - Red Word Triage >> Sep 15, 2023 12:26 PM Emily [name] wrote: Red Word that prompted transfer to Nurse Triage:   Symptoms for 4 days Lungs feel dry, tightness in chest after smoking Never had this issues before No chest pain, fever, SOB Chest x-ray last week was clear Abnormal findings on most recent CT scan   Chief Complaint: Tightness in lungs Symptoms: Tightness in lungs after smoking  Frequency: Intermittent  Pertinent Negatives: Patient denies chest pain or shortness of breath  Disposition: [] ED /[] Urgent Care (no appt availability in office) / [x] Appointment(In office/virtual)/ []  [facility] [place] [facility]/ [] Home Care/ [] Refused Recommended Disposition /[] [place] [facility]/ [x]  Follow-up with PCP Additional Notes: Patient states that for the last 4 days she has been experiencing some tightness in her lungs after smoking. She states "they just don't feel right." She denies any chest pain or shortness of breath with this sensation. Patient would like to know if Dr. Bertrum Brodie has any suggestions before her appointment next week. Please advise.    E2C2 Pulmonary Triage - Initial Assessment Questions "Chief Complaint (e.g., cough, sob, wheezing, fever, chills, sweat or additional symptoms) *Go to specific symptom protocol after initial questions. Smoking bothers lungs   "How long have symptoms been present?" 4 days   Have you tested for COVID or Flu? Note: If not, ask patient if a home test can be taken. If so, instruct patient to call back for positive results. No  MEDICINES:   "Have you used any OTC meds to help with symptoms?" No If yes, ask "What medications?" N/A  "Have you used your inhalers/maintenance medication?" No If yes, "What medications?" No, just taking daily breathing treatment   If inhaler, ask "How many puffs and how often?" Note: Review instructions on medication in the chart. N/A  OXYGEN: "Do you wear  supplemental oxygen?" No If yes, "How many liters are you supposed to use?" No  "Do you monitor your oxygen levels?" Yes If yes, "What is your reading (oxygen level) today?" 95%  "What is your usual oxygen saturation reading?"  (Note: Pulmonary O2 sats should be 90% or greater) 95%  Reason for Disposition  [1] MODERATE longstanding difficulty breathing (e.g., speaks in phrases, SOB even at rest, pulse 100-120) AND [2] SAME as normal  Answer Assessment - Initial Assessment Questions 1. RESPIRATORY STATUS: "Describe your breathing?" (e.g., wheezing, shortness of breath, unable to speak, severe coughing)      Lungs bothering her after smoking  2. ONSET: "When did this breathing problem begin?"      4 days ago 3. PATTERN "Does the difficult breathing come and go, or has it been constant since it started?"      Intermittent  4. SEVERITY: "How bad is your breathing?" (e.g., mild, moderate, severe)    - MILD: No SOB at rest, mild SOB with walking, speaks normally in sentences, can lie down, no retractions, pulse < 100.    - MODERATE: SOB at rest, SOB with minimal exertion and prefers to sit, cannot lie down flat, speaks in phrases, mild retractions, audible wheezing, pulse 100-120.    - SEVERE: Very SOB at rest, speaks in single words, struggling to breathe, sitting hunched forward, retractions, pulse > 120      Mild 5. RECURRENT SYMPTOM: "Have you had difficulty breathing before?" If Yes, ask: "When was the last time?" and "What happened that time?"      No 6. CARDIAC HISTORY: "Do you have any history  of heart disease?" (e.g., heart attack, angina, bypass surgery, angioplasty)      No 7. LUNG HISTORY: "Do you have any history of lung disease?"  (e.g., pulmonary embolus, asthma, emphysema)     Yes 8. CAUSE: "What do you think is causing the breathing problem?"      Unsure  9. OTHER SYMPTOMS: "Do you have any other symptoms? (e.g., dizziness, runny nose, cough, chest pain, fever)      No 10. O2 SATURATION MONITOR:  "Do you use an oxygen saturation monitor (pulse oximeter) at home?" If Yes, ask: "What is your reading (oxygen level) today?" "What is your usual oxygen saturation reading?" (e.g., 95%)       95%  Protocols used: Breathing Difficulty-A-AH

## 2023-09-15 NOTE — Telephone Encounter (Signed)
**Note De-identified  [place] Obfuscation** Please advise 

## 2023-09-15 NOTE — Telephone Encounter (Addendum)
 Received incoming call from patient & E2C2. Pt states she is having chest tightness after smoking cigarettes. I advised pt that stopping smoking is going to be best. Pt states she is using nebulizer & no inhalers.   Will wait for Dr. Bertrum Brodie to advise

## 2023-09-16 ENCOUNTER — Ambulatory Visit: Admitting: Surgical

## 2023-09-16 ENCOUNTER — Telehealth: Admitting: Nurse Practitioner

## 2023-09-16 ENCOUNTER — Telehealth

## 2023-09-16 DIAGNOSIS — J42 Unspecified chronic bronchitis: Secondary | ICD-10-CM | POA: Diagnosis not present

## 2023-09-16 DIAGNOSIS — J0140 Acute pansinusitis, unspecified: Secondary | ICD-10-CM | POA: Diagnosis not present

## 2023-09-16 MED ORDER — IPRATROPIUM BROMIDE 0.03 % NA SOLN: 2.0000 | Freq: Two times a day (BID) | NASAL | 0 refills | Status: DC

## 2023-09-16 MED ORDER — BENZONATATE 100 MG PO CAPS
100.0000 mg | ORAL_CAPSULE | Freq: Three times a day (TID) | ORAL | 0 refills | Status: DC | PRN
Start: 2023-09-16 — End: 2023-12-03

## 2023-09-16 MED ORDER — FLUTICASONE PROPIONATE 50 MCG/ACT NA SUSP: 2.0000 | Freq: Every day | NASAL | 6 refills | Status: DC

## 2023-09-16 MED ORDER — AZITHROMYCIN 250 MG PO TABS: ORAL_TABLET | ORAL | 0 refills | Status: AC

## 2023-09-16 NOTE — Progress Notes (Signed)
 Virtual Visit Consent   Emily [name], you are scheduled for a virtual visit with a [facility] provider today. Just as with appointments in the office, your consent must be obtained to participate. Your consent will be active for this visit and any virtual visit you may have with one of our providers in the next 365 days. If you have a MyChart account, a copy of this consent can be sent to you electronically.  As this is a virtual visit, video technology does not allow for your provider to perform a traditional examination. This may limit your provider's ability to fully assess your condition. If your provider identifies any concerns that need to be evaluated in person or the need to arrange testing (such as labs, EKG, etc.), we will make arrangements to do so. Although advances in technology are sophisticated, we cannot ensure that it will always work on either your end or our end. If the connection with a video visit is poor, the visit may have to be switched to a telephone visit. With either a video or telephone visit, we are not always able to ensure that we have a secure connection.  By engaging in this virtual visit, you consent to the provision of healthcare and authorize for your insurance to be billed (if applicable) for the services provided during this visit. Depending on your insurance coverage, you may receive a charge related to this service.  I need to obtain your verbal consent now. Are you willing to proceed with your visit today? Emily [name] has provided verbal consent on 09/16/2023 for a virtual visit (video or telephone). Mardene Shake, FNP  Date: 09/16/2023 7:17 PM   Virtual Visit via Video Note   I, Mardene Shake, connected with  Emily [name]  ([id number], [birth year]) on 09/16/23 at  7:15 PM EDT by a video-enabled telemedicine application and verified that I am speaking with the correct person using two identifiers.  Location: Patient: Virtual Visit Location  Patient: Home Provider: Virtual Visit Location Provider: Home Office   I discussed the limitations of evaluation and management by telemedicine and the availability of in person appointments. The patient expressed understanding and agreed to proceed.    History of Present Illness: Emily [name] is a 43 y.o. who identifies as a female who was assigned female at birth, and is being seen today for nasal congestion and post nasal drainage causing her to cough. She has a significant medical history of asthma and chronic bronchitis. She has been using her Albuterol  as needed   The congestion is mainly present in her sinuses today   She has not tried anything over the counter at this point  She has a lot of antibiotic allergies and states that typically when she is sick she can only use azithromycin .  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   Bulimia nervosa 06/15/2023   Borderline personality disorder (HCC) 06/15/2023   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 06/15/2023   Chronic  prescription benzodiazepine use 06/15/2023   History of alcohol use disorder in sustained remission 06/15/2023   Polypharmacy 06/15/2023   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   Caffeine-induced insomnia with snoring 06/15/2023   Irregular bleeding 06/02/2023   Pelvic cramping 06/02/2023   Urinary frequency 06/02/2023   Pregnancy examination or test, negative result  06/02/2023   Missed periods 06/02/2023   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   Sebaceous cyst of labia 02/03/2023   Cyst of left ovary 08/11/2022   ASCUS with positive high risk HPV cervical 08/11/2022   Moody 07/30/2022   Hot flashes 07/30/2022   Friable cervix 07/30/2022   Spotting 07/30/2022   Amenorrhea, secondary 07/30/2022   Bartholin cyst 04/30/2022   Irregular periods 04/30/2022   Dental caries 03/11/2022   Tick bite of back 12/20/2020   Chronic bronchitis (HCC) 07/09/2020   Breast tenderness 09/20/2018   Gastroesophageal reflux disease 09/16/2018   Shortness of breath 08/05/2018   Abnormal finding on lung imaging 08/05/2018   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   Swollen lymph nodes 01/06/2017   RUQ pain 10/08/2016   LLQ pain 10/08/2016   Menstrual period late 10/08/2016   Cyst of right Bartholin's gland 12/17/2015   Recurrent boils 06/18/2015   Type 2 diabetes mellitus with complication, without long-term current use of insulin  (HCC) 04/18/2015   Ingrowing toenail 04/18/2015   Vitamin D  deficiency 04/18/2015   Angular cheilitis 04/18/2015   Dyslipidemia 04/18/2015   Diabetes mellitus with complication in adult patient (HCC) 02/20/2015   Smoker 02/20/2015   Essential hypertension 02/20/2015   Pain in limb 02/20/2015   Myalgia 02/20/2015   Hypothyroidism 02/20/2015   Leg pain, inferior 02/20/2015   Toenail deformity 02/20/2015   Elevated liver enzymes 01/07/2012   Abnormal CT of liver 09/30/2011   Fatty liver 09/30/2011   G E REFLUX 11/30/2007   CHEST PAIN-UNSPECIFIED 11/30/2007   HEADACHE 10/21/2007   DYSPNEA 09/15/2007   UNSPECIFIED TACHYCARDIA 06/01/2007   COUGH 05/24/2007   TOBACCO ABUSE 05/20/2007   Pulmonary nodules 05/12/2007   DEPRESSION 04/19/2007   Asthma 04/19/2007    Allergies:  Allergies  Allergen Reactions   Bactrim  [Sulfamethoxazole -Trimethoprim ] Anaphylaxis and Other (See Comments)    Chest pain, trouble breathing   Ciprofloxacin Palpitations    Clindamycin /Lincomycin     Other reaction(s): Chest Pain   Flagyl  [Metronidazole ] Shortness Of Breath   Hydrocodone Other (See Comments)   Iodinated Contrast Media Swelling   Ondansetron Shortness Of Breath   Liraglutide  Nausea And Vomiting   Doxycycline  Nausea And Vomiting   Penicillins    Synjardy  Xr [Empagliflozin-Metformin  Hcl Er]    Medications:  Current Outpatient Medications:    ACCU-CHEK AVIVA PLUS test strip, 1 each by Other route 3 (three) times daily., Disp: , Rfl:    ACCU-CHEK FASTCLIX LANCETS MISC, USE AS DIRECTED, Disp: 102 each, Rfl: 3   albuterol  (VENTOLIN  HFA) 108 (90 Base) MCG/ACT inhaler, Inhale 2 puffs into the lungs every 6 (six) hours as needed for wheezing or shortness of breath., Disp: 18 g, Rfl: 3   amLODipine  (NORVASC ) 5 MG tablet, TAKE 1 TABLET (5 MG TOTAL) BY MOUTH DAILY., Disp: 90 tablet, Rfl: 3   benzonatate  (TESSALON ) 100 MG capsule, Take 1 capsule (100 mg total) by mouth 3 (three) times daily as needed for cough., Disp: 30 capsule, Rfl: 0   Blood Glucose Monitoring Suppl (ACCU-CHEK AVIVA PLUS) w/Device KIT, USE AS DIRECTED, Disp: 1 kit, Rfl: 0   Blood Glucose Monitoring Suppl DEVI, 1 each by Does not apply route in the  morning, at noon, and at bedtime. May substitute to any manufacturer covered by patient's insurance., Disp: 1 each, Rfl: 0   clonazePAM (KLONOPIN) 1 MG tablet, Take 1 mg by mouth 2 (two) times daily., Disp: , Rfl:    Continuous Glucose Receiver (DEXCOM G7 RECEIVER) DEVI, 1 Device by Does not apply route continuous., Disp: 1 each, Rfl: 0   Continuous Glucose Sensor (DEXCOM G7 SENSOR) MISC, USE AS DIRECTED, Disp: 9 each, Rfl: 0   Empagliflozin-metFORMIN  HCl ER (SYNJARDY  XR) 12.08-998 MG TB24, Take 2 tablets by mouth daily. (Patient not taking: Reported on 08/25/2023), Disp: 60 tablet, Rfl: 5   fluticasone -salmeterol (ADVAIR) 250-50 MCG/ACT AEPB, Inhale 1 puff into the lungs every 12 (twelve) hours. Rinse after use. (Patient not taking: Reported on  08/25/2023), Disp: 60 each, Rfl: 5   insulin  lispro (HUMALOG  KWIKPEN) 100 UNIT/ML KwikPen, Max daily 45 units, Disp: 30 mL, Rfl: 4   Insulin  Pen Needle 32G X 4 MM MISC, 1 Device by Does not apply route in the morning, at noon, in the evening, and at bedtime., Disp: 400 each, Rfl: 3   ipratropium (ATROVENT) 0.03 % nasal spray, Place 2 sprays into both nostrils every 12 (twelve) hours., Disp: 30 mL, Rfl: 0   ipratropium-albuterol  (DUONEB) 0.5-2.5 (3) MG/3ML SOLN, Take 3 mLs by nebulization in the morning, at noon, and at bedtime., Disp: 270 mL, Rfl: 11   lamoTRIgine (LAMICTAL) 150 MG tablet, Take 150 mg by mouth daily., Disp: , Rfl:    levothyroxine  (SYNTHROID ) 75 MCG tablet, TAKE 1 TABLET BY MOUTH DAILY BEFORE BREAKFAST., Disp: 90 tablet, Rfl: 0   megestrol  (MEGACE ) 40 MG tablet, 3 tablets a day for 5 days, 2 tablets a day for 5 days then 1 tablet daily, Disp: 45 tablet, Rfl: 3   montelukast  (SINGULAIR ) 10 MG tablet, TAKE 1 TABLET BY MOUTH EVERYDAY AT BEDTIME, Disp: 90 tablet, Rfl: 3   naproxen  (NAPROSYN ) 500 MG tablet, Take 1 tablet (500 mg total) by mouth 2 (two) times daily with a meal. (Patient taking differently: Take 500 mg by mouth daily as needed for moderate pain (pain score 4-6).), Disp: 30 tablet, Rfl: 0   Nebulizers (COMPRESSOR/NEBULIZER) MISC, Use as directed, Disp: 1 each, Rfl: 0   nitroGLYCERIN  (NITROSTAT ) 0.4 MG SL tablet, Place 1 tablet (0.4 mg total) under the tongue every 5 (five) minutes as needed for chest pain., Disp: 25 tablet, Rfl: 3   nystatin  cream (MYCOSTATIN ), Apply to affected area 2 times daily (Patient not taking: Reported on 08/25/2023), Disp: 30 g, Rfl: 0   omeprazole  (PRILOSEC) 40 MG capsule, Take 40 mg by mouth every morning., Disp: , Rfl:    PARoxetine (PAXIL) 10 MG tablet, Take 10 mg by mouth at bedtime., Disp: , Rfl:    predniSONE  (DELTASONE ) 20 MG tablet, 60 mg x3d, 40 mg x3d, 20 mg x2d, 10 mg x2d (Patient not taking: Reported on 08/25/2023), Disp: 18 tablet, Rfl: 0    promethazine (PHENERGAN) 12.5 MG tablet, Take 12.5 mg by mouth every 8 (eight) hours as needed for vomiting or nausea., Disp: , Rfl:    propranolol  (INDERAL ) 20 MG tablet, Take 1 tablet (20 mg total) by mouth 3 (three) times daily., Disp: 270 tablet, Rfl: 3   pseudoephedrine  (SUDAFED) 120 MG 12 hr tablet, Take 1 tablet (120 mg total) by mouth 2 (two) times daily. (Patient not taking: Reported on 08/25/2023), Disp: 20 tablet, Rfl: 0   Respiratory Therapy Supplies (NEBULIZER/TUBING/MOUTHPIECE) KIT, Use as directed, Disp: 10 kit, Rfl: 11  rosuvastatin  (CRESTOR ) 5 MG tablet, Take 1 tablet (5 mg total) by mouth daily., Disp: 90 tablet, Rfl: 1   solifenacin  (VESICARE ) 5 MG tablet, Take 1 tablet (5 mg total) by mouth daily. (Patient not taking: Reported on 08/25/2023), Disp: 30 tablet, Rfl: 2   Vitamin D , Ergocalciferol , (DRISDOL ) 1.25 MG (50000 UNIT) CAPS capsule, Take 50,000 Units by mouth every 7 (seven) days., Disp: , Rfl:    ziprasidone (GEODON) 60 MG capsule, Take 60 mg by mouth at bedtime., Disp: , Rfl:   Observations/Objective: Patient is well-developed, well-nourished in no acute distress.  Resting comfortably  at home.  Head is normocephalic, atraumatic.  No labored breathing.  Speech is clear and coherent with logical content.  Patient is alert and oriented at baseline.    Assessment and Plan:  1. Acute non-recurrent pansinusitis  2. Chronic bronchitis, unspecified chronic bronchitis type (HCC)  Continue inhalers as directed and add nasal spray as directed to help with sinus congestion    Meds ordered this encounter  Medications   fluticasone  (FLONASE) 50 MCG/ACT nasal spray    Sig: Place 2 sprays into both nostrils daily.    Dispense:  16 g    Refill:  6   azithromycin  (ZITHROMAX ) 250 MG tablet    Sig: Take 2 tablets on day 1, then 1 tablet daily on days 2 through 5    Dispense:  6 tablet    Refill:  0      Follow Up Instructions: I discussed the assessment and treatment  plan with the patient. The patient was provided an opportunity to ask questions and all were answered. The patient agreed with the plan and demonstrated an understanding of the instructions.  A copy of instructions were sent to the patient via MyChart unless otherwise noted below.    The patient was advised to call back or seek an in-person evaluation if the symptoms worsen or if the condition fails to improve as anticipated.    Mardene Shake, FNP

## 2023-09-16 NOTE — Progress Notes (Signed)

## 2023-09-16 NOTE — Telephone Encounter (Signed)
 I sent another rmessage. At this point I recommend an eval next few weeks by an MD or APP

## 2023-09-16 NOTE — Telephone Encounter (Signed)
 I think gave antibiotics once. Needs eval. Cannot answer bette over triage messages. See APP or any MD on acute basis next week

## 2023-09-16 NOTE — Progress Notes (Signed)
 I have spent 5 minutes in review of e-visit questionnaire, review and updating patient chart, medical decision making and response to patient.   [name], PA-C

## 2023-09-17 NOTE — Telephone Encounter (Signed)
 MR, there are no openings with any provider next wk, including APP So, we can overbook you to see her or would you like me to advise UC?

## 2023-09-17 NOTE — Telephone Encounter (Signed)
 Pt is scheduled with MR for 09/22/23.

## 2023-09-19 ENCOUNTER — Other Ambulatory Visit: Payer: Self-pay | Admitting: Family Medicine

## 2023-09-19 DIAGNOSIS — E039 Hypothyroidism, unspecified: Secondary | ICD-10-CM

## 2023-09-19 NOTE — Telephone Encounter (Signed)
 Paged by operator that patient is having leg pain/cold legs. Called patient. She is having bilateral calf pain which started a few hours ago with throbbing sensation. No SOB at rest or exertion. No skin changes or erythema. Also feels generally cold, especially in legs/feet. Throbbing pain. Took temperature and no fever, T 98. Just had respiratory infection and took abx (Z-pack), two tablets left. Nothing serious in terms of respiratory, mainly head cold. No systemic sx. Somewhat lightheaded today. Eating and drinking normally. Has DM2 and BG in the 200s. Na 129, doesn't have other labs, done in [place]. Na 132, K 3.9, no Mg on 05/28. Unclear what is causing sx, only option is ED evaluation if she wants further eval. She likely is going to go to [facility] ED for eval.

## 2023-09-20 ENCOUNTER — Other Ambulatory Visit: Payer: Self-pay

## 2023-09-20 ENCOUNTER — Encounter (HOSPITAL_BASED_OUTPATIENT_CLINIC_OR_DEPARTMENT_OTHER): Payer: Self-pay | Admitting: Emergency Medicine

## 2023-09-20 ENCOUNTER — Emergency Department (HOSPITAL_BASED_OUTPATIENT_CLINIC_OR_DEPARTMENT_OTHER)
Admission: EM | Admit: 2023-09-20 | Discharge: 2023-09-20 | Disposition: A | Discharge status: Home / Self Care | Attending: Emergency Medicine | Admitting: Emergency Medicine

## 2023-09-20 ENCOUNTER — Emergency Department (HOSPITAL_BASED_OUTPATIENT_CLINIC_OR_DEPARTMENT_OTHER)

## 2023-09-20 DIAGNOSIS — M79662 Pain in left lower leg: Secondary | ICD-10-CM | POA: Diagnosis not present

## 2023-09-20 DIAGNOSIS — M79661 Pain in right lower leg: Secondary | ICD-10-CM | POA: Insufficient documentation

## 2023-09-20 DIAGNOSIS — Z0189 Encounter for other specified special examinations: Secondary | ICD-10-CM | POA: Diagnosis not present

## 2023-09-20 DIAGNOSIS — Z794 Long term (current) use of insulin: Secondary | ICD-10-CM | POA: Diagnosis not present

## 2023-09-20 LAB — CBC WITH DIFFERENTIAL/PLATELET
Abs Immature Granulocytes: 0.02 10*3/uL (ref 0.00–0.07)
Basophils Absolute: 0.1 10*3/uL (ref 0.0–0.1)
Basophils Relative: 1 %
Eosinophils Absolute: 0.2 10*3/uL (ref 0.0–0.5)
Eosinophils Relative: 3 %
HCT: 42.2 % (ref 36.0–46.0)
Hemoglobin: 14.4 g/dL (ref 12.0–15.0)
Immature Granulocytes: 0 %
Lymphocytes Relative: 56 %
Lymphs Abs: 5 10*3/uL — ABNORMAL HIGH (ref 0.7–4.0)
MCH: 32.7 pg (ref 26.0–34.0)
MCHC: 34.1 g/dL (ref 30.0–36.0)
MCV: 95.7 fL (ref 80.0–100.0)
Monocytes Absolute: 0.5 10*3/uL (ref 0.1–1.0)
Monocytes Relative: 6 %
Neutro Abs: 3 10*3/uL (ref 1.7–7.7)
Neutrophils Relative %: 34 %
Platelets: 162 10*3/uL (ref 150–400)
RBC: 4.41 MIL/uL (ref 3.87–5.11)
RDW: 13.3 % (ref 11.5–15.5)
WBC Morphology: ABNORMAL
WBC: 8.7 10*3/uL (ref 4.0–10.5)
nRBC: 0 % (ref 0.0–0.2)

## 2023-09-20 LAB — BASIC METABOLIC PANEL WITH GFR
Anion gap: 11 (ref 5–15)
BUN: <5 mg/dL — ABNORMAL LOW (ref 6–20)
CO2: 24 mmol/L (ref 22–32)
Calcium: 9.3 mg/dL (ref 8.9–10.3)
Chloride: 98 mmol/L (ref 98–111)
Creatinine, Ser: 0.58 mg/dL (ref 0.44–1.00)
GFR, Estimated: >60 mL/min (ref >60)
Glucose, Bld: 127 mg/dL — ABNORMAL HIGH (ref 70–99)
Potassium: 3.7 mmol/L (ref 3.5–5.1)
Sodium: 133 mmol/L — ABNORMAL LOW (ref 135–145)

## 2023-09-20 LAB — CK: Total CK: 31 U/L — ABNORMAL LOW (ref 38–234)

## 2023-09-20 MED ORDER — SODIUM CHLORIDE 0.9 % IV BOLUS: 1000.0000 mL | Freq: Once | INTRAVENOUS | Status: DC

## 2023-09-20 MED ORDER — CYCLOBENZAPRINE HCL 10 MG PO TABS
10.0000 mg | ORAL_TABLET | Freq: Once | ORAL | Status: AC
  Administered 2023-09-20: 10 mg via ORAL
  Filled 2023-09-20: qty 1

## 2023-09-20 MED ORDER — CYCLOBENZAPRINE HCL 10 MG PO TABS: 10.0000 mg | ORAL_TABLET | Freq: Three times a day (TID) | ORAL | 0 refills | Status: DC | PRN

## 2023-09-20 MED ORDER — PROCHLORPERAZINE EDISYLATE 10 MG/2ML IJ SOLN: 10.0000 mg | Freq: Once | INTRAMUSCULAR | Status: DC

## 2023-09-20 MED ORDER — KETOROLAC TROMETHAMINE 30 MG/ML IJ SOLN: 30.0000 mg | Freq: Once | INTRAMUSCULAR | Status: DC

## 2023-09-20 NOTE — Telephone Encounter (Signed)
 Patient was seen last night by Dr. Lula Sale.  His note indicates patient was supposed to come back today to get Doppler ultrasounds of her legs.  I do not see an order.  Order placed.

## 2023-09-20 NOTE — Discharge Instructions (Signed)
 Take ibuprofen 600 mg every 6 hours as needed for pain.  Begin taking Flexeril as prescribed as needed for pain not relieved with ibuprofen.  Return tomorrow at the given time for an ultrasound to rule out blood clots.  Rest.  Follow-up with primary doctor if not improving in the next few days.

## 2023-09-20 NOTE — ED Provider Notes (Signed)
  EMERGENCY DEPARTMENT AT [facility] Provider Note   CSN: [id number] Arrival date & time: 09/20/23  0211     History  Chief Complaint  Patient presents with   Leg Pain    Emily [name] is a 43 y.o. female.  Patient is a 43 year old female presenting with bilateral leg pain.  She states she was walking at [facility] earlier this evening and pain began then.  She describes severe pain to both calves that makes it difficult for her to ambulate.  No other injury or trauma.  No chest pain or difficulty breathing.       Home Medications Prior to Admission medications   Medication Sig Start Date End Date Taking? Authorizing Provider  ACCU-CHEK AVIVA PLUS test strip 1 each by Other route 3 (three) times daily. 04/28/20   [provider]  ACCU-CHEK FASTCLIX LANCETS MISC USE AS DIRECTED 12/10/17   Tysinger, Christiane Cowing, PA-C  albuterol  (VENTOLIN  HFA) 108 (90 Base) MCG/ACT inhaler Inhale 2 puffs into the lungs every 6 (six) hours as needed for wheezing or shortness of breath. 08/05/23   Raejean Bullock, NP  amLODipine  (NORVASC ) 5 MG tablet TAKE 1 TABLET (5 MG TOTAL) BY MOUTH DAILY. 03/04/23   Eilleen Grates, MD  azithromycin  (ZITHROMAX ) 250 MG tablet Take 2 tablets on day 1, then 1 tablet daily on days 2 through 5 09/16/23 09/21/23  Mardene Shake, FNP  benzonatate  (TESSALON ) 100 MG capsule Take 1 capsule (100 mg total) by mouth 3 (three) times daily as needed for cough. 09/16/23   Farris Hong, PA-C  Blood Glucose Monitoring Suppl (ACCU-CHEK AVIVA PLUS) w/Device KIT USE AS DIRECTED 05/01/16   Tysinger, Christiane Cowing, PA-C  Blood Glucose Monitoring Suppl DEVI 1 each by Does not apply route in the morning, at noon, and at bedtime. May substitute to any manufacturer covered by patient's insurance. 01/22/23   Motwani, Komal, MD  clonazePAM (KLONOPIN) 1 MG tablet Take 1 mg by mouth 2 (two) times daily.    [provider]  Continuous Glucose Receiver (DEXCOM G7 RECEIVER) DEVI  1 Device by Does not apply route continuous. 01/22/23   Motwani, Komal, MD  Continuous Glucose Sensor (DEXCOM G7 SENSOR) MISC USE AS DIRECTED 08/31/23   Jorge Newcomer, MD  Empagliflozin-metFORMIN  HCl ER (SYNJARDY  XR) 12.08-998 MG TB24 Take 2 tablets by mouth daily. Patient not taking: Reported on 08/25/2023 05/22/23   Motwani, Komal, MD  fluticasone  (FLONASE ) 50 MCG/ACT nasal spray Place 2 sprays into both nostrils daily. 09/16/23   Mardene Shake, FNP  fluticasone -salmeterol (ADVAIR) 250-50 MCG/ACT AEPB Inhale 1 puff into the lungs every 12 (twelve) hours. Rinse after use. Patient not taking: Reported on 08/25/2023 07/22/22   Parrett, Macdonald Savoy, NP  insulin  lispro (HUMALOG  KWIKPEN) 100 UNIT/ML KwikPen Max daily 45 units 07/03/23   Shamleffer, Ibtehal Jaralla, MD  Insulin  Pen Needle 32G X 4 MM MISC 1 Device by Does not apply route in the morning, at noon, in the evening, and at bedtime. 07/03/23   Shamleffer, Ibtehal Jaralla, MD  ipratropium (ATROVENT ) 0.03 % nasal spray Place 2 sprays into both nostrils every 12 (twelve) hours. 09/16/23   Farris Hong, PA-C  ipratropium-albuterol  (DUONEB) 0.5-2.5 (3) MG/3ML SOLN Take 3 mLs by nebulization in the morning, at noon, and at bedtime. 09/01/23   Maire Scot, MD  lamoTRIgine (LAMICTAL) 150 MG tablet Take 150 mg by mouth daily.    [provider]  levothyroxine  (SYNTHROID ) 75 MCG tablet TAKE 1 TABLET BY MOUTH  DAILY BEFORE BREAKFAST. 06/23/23   Williamson, Joanna R, NP  megestrol  (MEGACE ) 40 MG tablet 3 tablets a day for 5 days, 2 tablets a day for 5 days then 1 tablet daily 07/17/23   Wendelyn Halter, MD  montelukast  (SINGULAIR ) 10 MG tablet TAKE 1 TABLET BY MOUTH EVERYDAY AT BEDTIME 07/20/23   Parrett, [name] S, NP  naproxen  (NAPROSYN ) 500 MG tablet Take 1 tablet (500 mg total) by mouth 2 (two) times daily with a meal. Patient taking differently: Take 500 mg by mouth daily as needed for moderate pain (pain score 4-6). 05/15/23   Mardene Shake, FNP   Nebulizers (COMPRESSOR/NEBULIZER) MISC Use as directed 11/15/19   Maire Scot, MD  nitroGLYCERIN  (NITROSTAT ) 0.4 MG SL tablet Place 1 tablet (0.4 mg total) under the tongue every 5 (five) minutes as needed for chest pain. 03/10/22   Nahser, Lela Purple, MD  nystatin  cream (MYCOSTATIN ) Apply to affected area 2 times daily Patient not taking: Reported on 08/25/2023 07/06/23   Early Glisson, MD  omeprazole  (PRILOSEC) 40 MG capsule Take 40 mg by mouth every morning. 08/05/21   [provider]  PARoxetine (PAXIL) 10 MG tablet Take 10 mg by mouth at bedtime. 05/27/23   [provider]  predniSONE  (DELTASONE ) 20 MG tablet 60 mg x3d, 40 mg x3d, 20 mg x2d, 10 mg x2d Patient not taking: Reported on 08/25/2023 06/17/23   Napolean Backbone A, DO  promethazine (PHENERGAN) 12.5 MG tablet Take 12.5 mg by mouth every 8 (eight) hours as needed for vomiting or nausea. 09/24/21   [provider]  propranolol  (INDERAL ) 20 MG tablet Take 1 tablet (20 mg total) by mouth 3 (three) times daily. 03/31/23   Eilleen Grates, MD  pseudoephedrine  (SUDAFED) 120 MG 12 hr tablet Take 1 tablet (120 mg total) by mouth 2 (two) times daily. Patient not taking: Reported on 08/25/2023 06/11/23   Wanita Gutta, PA-C  Respiratory Therapy Supplies (NEBULIZER/TUBING/MOUTHPIECE) KIT Use as directed 11/15/19   Maire Scot, MD  rosuvastatin  (CRESTOR ) 5 MG tablet Take 1 tablet (5 mg total) by mouth daily. 07/23/23   Motwani, Komal, MD  solifenacin  (VESICARE ) 5 MG tablet Take 1 tablet (5 mg total) by mouth daily. Patient not taking: Reported on 08/25/2023 07/02/23 07/01/24  Lendia Quay A, NP  Vitamin D , Ergocalciferol , (DRISDOL ) 1.25 MG (50000 UNIT) CAPS capsule Take 50,000 Units by mouth every 7 (seven) days. 07/23/23   [provider]  ziprasidone (GEODON) 60 MG capsule Take 60 mg by mouth at bedtime. 05/22/20   [provider]      Allergies    Bactrim  [sulfamethoxazole -trimethoprim ], Ciprofloxacin,  Clindamycin /lincomycin, Flagyl  [metronidazole ], Hydrocodone, Iodinated contrast media, Ondansetron, Liraglutide , Doxycycline , Penicillins, and Synjardy  xr [empagliflozin-metformin  hcl er]    Review of Systems   Review of Systems  All other systems reviewed and are negative.   Physical Exam Updated Vital Signs BP (!) 166/92 (BP Location: Right Arm)   Pulse 75   Temp 97.9 F (36.6 C) (Oral)   Resp 19   Ht 5\' 3"  (1.6 m)   Wt 87.1 kg   LMP  (LMP Unknown)   SpO2 95%   BMI 34.01 kg/m  Physical Exam Vitals and nursing note reviewed.  Constitutional:      Appearance: Normal appearance.  Pulmonary:     Effort: Pulmonary effort is normal.  Musculoskeletal:     Comments: Bilateral calves are grossly normal in appearance.  Celine Collard' sign is absent bilaterally.  DP pulses are easily palpable bilaterally and motor  and sensation are intact throughout both feet.  Skin:    General: Skin is warm and dry.  Neurological:     Mental Status: She is alert and oriented to person, place, and time.     ED Results / Procedures / Treatments   Labs (all labs ordered are listed, but only abnormal results are displayed) Labs Reviewed - No data to display  EKG None  Radiology No results found.  Procedures Procedures    Medications Ordered in ED Medications - No data to display  ED Course/ Medical Decision Making/ A&P  Patient presenting here with complaints of bilateral calf pain that started while walking at [facility].  Patient arrives with stable vital signs and is afebrile.  Physical examination reveals mild tenderness in the musculature of the calves, but no swelling or edema.  DP pulses are palpable and motor and sensation are intact.  Celine Collard' sign is absent.  Laboratory studies obtained including CBC and basic metabolic panel.  Studies unremarkable.  Total CK trivially elevated.  X-rays taken of both tibias and fibula's, both of which are unremarkable.  I will arrange an outpatient  ultrasound to rule out DVT and have patient take ibuprofen and Flexeril in the meantime.  Final Clinical Impression(s) / ED Diagnoses Final diagnoses:  None    Rx / DC Orders ED Discharge Orders     None         Orvilla Blander, MD 09/20/23 [phone]

## 2023-09-20 NOTE — ED Triage Notes (Signed)
 Pt reports bilateral calf pain since walking at [employer]. Denies any injury.

## 2023-09-21 ENCOUNTER — Encounter: Payer: Self-pay | Admitting: "Endocrinology

## 2023-09-21 ENCOUNTER — Telehealth (INDEPENDENT_AMBULATORY_CARE_PROVIDER_SITE_OTHER): Admitting: "Endocrinology

## 2023-09-21 ENCOUNTER — Encounter: Payer: Self-pay | Admitting: Cardiology

## 2023-09-21 ENCOUNTER — Ambulatory Visit (HOSPITAL_BASED_OUTPATIENT_CLINIC_OR_DEPARTMENT_OTHER)
Admission: RE | Admit: 2023-09-21 | Discharge: 2023-09-21 | Disposition: A | Discharge status: Home / Self Care | Source: Ambulatory Visit | Attending: Emergency Medicine | Admitting: Emergency Medicine

## 2023-09-21 DIAGNOSIS — E782 Mixed hyperlipidemia: Secondary | ICD-10-CM

## 2023-09-21 DIAGNOSIS — Z7984 Long term (current) use of oral hypoglycemic drugs: Secondary | ICD-10-CM | POA: Diagnosis not present

## 2023-09-21 DIAGNOSIS — M79662 Pain in left lower leg: Secondary | ICD-10-CM | POA: Insufficient documentation

## 2023-09-21 DIAGNOSIS — M79661 Pain in right lower leg: Secondary | ICD-10-CM | POA: Diagnosis not present

## 2023-09-21 DIAGNOSIS — Z87891 Personal history of nicotine dependence: Secondary | ICD-10-CM | POA: Diagnosis not present

## 2023-09-21 DIAGNOSIS — Z794 Long term (current) use of insulin: Secondary | ICD-10-CM

## 2023-09-21 DIAGNOSIS — E1165 Type 2 diabetes mellitus with hyperglycemia: Secondary | ICD-10-CM

## 2023-09-21 MED ORDER — METFORMIN HCL ER 500 MG PO TB24: 500.0000 mg | ORAL_TABLET | Freq: Two times a day (BID) | ORAL | 5 refills | Status: DC

## 2023-09-21 NOTE — Patient Instructions (Addendum)
 Will recommend the following: Metformin  XR 500 mg bid  Lantus  30-40 units once every morning Humalog  15 units 15 min before each meal   _________   Goals of DM therapy:  Morning Fasting blood sugar: 80-140  Blood sugar before meals: 80-140 Bed time blood sugar: 100-150  A1C <7%, limited only by hypoglycemia  1.Diabetes medications and their side effects discussed, including hypoglycemia    2. Check blood glucose:  a) Always check blood sugars before driving. Please see below (under hypoglycemia) on how to manage b) Check a minimum of 3 times/day or more as needed when having symptoms of hypoglycemia.   c) Try to check blood glucose before sleeping/in the middle of the night to ensure that it is remaining stable and not dropping less than 100 d) Check blood glucose more often if sick  3. Diet: a) 3 meals per day schedule b: Restrict carbs to 60-70 grams (4 servings) per meal c) Colorful vegetables - 3 servings a day, and low sugar fruit 2 servings/day Plate control method: 1/4 plate protein, 1/4 starch, 1/2 green, yellow, or red vegetables d) Avoid carbohydrate snacks unless hypoglycemic episode, or increased physical activity  4. Regular exercise as tolerated, preferably 3 or more hours a week  5. Hypoglycemia: a)  Do not drive or operate machinery without first testing blood glucose to assure it is over 90 mg%, or if dizzy, lightheaded, not feeling normal, etc, or  if foot or leg is numb or weak. b)  If blood glucose less than 70, take four 5gm Glucose tabs or 15-30 gm Glucose gel.  Repeat every 15 min as needed until blood sugar is >100 mg/dl. If hypoglycemia persists then call 911.   6. Sick day management: a) Check blood glucose more often b) Continue usual therapy if blood sugars are elevated.   7. Contact the doctor immediately if blood glucose is frequently <60 mg/dl, or an episode of severe hypoglycemia occurs (where someone had to give you glucose/  glucagon or if  you passed out from a low blood glucose), or if blood glucose is persistently >350 mg/dl, for further management  8. A change in level of physical activity or exercise and a change in diet may also affect your blood sugar. Check blood sugars more often and call if needed.  Instructions: 1. Bring glucose meter, blood glucose records on every visit for review 2. Continue to follow up with primary care physician and other providers for medical care 3. Yearly eye  and foot exam 4. Please get blood work done prior to the next appointment

## 2023-09-21 NOTE — Progress Notes (Signed)
 The patient reports they are currently: [place]. I spent 6-7 minutes on the [place] with the patient on the date of service. I spent an additional 5 minutes on pre- and post-visit activities on the date of service.   The patient was physically located in [place]  or a state in which I am permitted to provide care. The patient and/or parent/guardian understood that s/he may incur co-pays and cost sharing, and agreed to the telemedicine visit. The visit was reasonable and appropriate under the circumstances given the patient's presentation at the time.  The patient and/or parent/guardian has been advised of the potential risks and limitations of this mode of treatment (including, but not limited to, the absence of in-person examination) and has agreed to be treated using telemedicine. The patient's/patient's family's questions regarding telemedicine have been answered.   The patient and/or parent/guardian has also been advised to contact their provider's office for worsening conditions, and seek emergency medical treatment and/or call 911 if the patient deems either necessary.    Outpatient Endocrinology Note Jorge Newcomer, MD  09/21/23   Emily [name] [birth year] [id number]  Referring Provider: Francenia Ingle, NP Primary Care Provider: Francenia Ingle, NP Reason for consultation: Subjective   Assessment & Plan  Diagnoses and all orders for this visit:  Uncontrolled type 2 diabetes mellitus with hyperglycemia (HCC)  Long-term insulin  use (HCC)  Mixed hypercholesterolemia and hypertriglyceridemia  Other orders -     metFORMIN  (GLUCOPHAGE -XR) 500 MG 24 hr tablet; Take 1 tablet (500 mg total) by mouth 2 (two) times daily with a meal.     Diabetes Type II complicated by neuropathy Lab Results  Component Value Date   GFR 108.36 01/22/2023   Hba1c goal less than 7, current Hba1c is  Lab Results  Component Value Date   HGBA1C 10.9 (H) 05/20/2023   Will recommend  the following: On Dexcom Metformin  XR 500 mg bid  Lantus  30-40 units once every morning Humalog  15 units 15 min before each meal    Previously, Did not do synjardy  XR 08/998 Self stopped Lantus  22-25 units once every morning,Humalog  10 units 15 min before each meal, Pioglitazone  15 mg qd Patient quit all medications and Dexcom and did not keep DM appointment due to "weight gain" Tried Trulicity-reports head ache; tried victoza /mounjaro /ozempic -caused nausea/vomiting Reports trying even slow release metformin  but d/c due to diarrhea  Other options include rybelsus/januvia  (need to watch Tg) Patient is not open to the idea of insulin  despite recommendations nor bariatric surgery consultation  No known contraindications/side effects to any of above medications No history of MEN syndrome/medullary thyroid cancer/pancreatitis or pancreatic cancer in self or family  -Last LD and Tg are as follows: Lab Results  Component Value Date   LDLCALC 102 (H) 07/16/2021    Lab Results  Component Value Date   TRIG (H) 01/16/2023    411.0 Triglyceride is over 400; calculations on Lipids are invalid.   -not on statin (has cirrhosis per pt, atorvastatin  40 mg was stopped by provider, patient will be discussed the use with her liver doctor) -Follow low fat diet and exercise   -Blood pressure goal <140/90 - Microalbumin/creatinine goal is < 30 -Last MA/Cr is as follows: Lab Results  Component Value Date   MICROALBUR <0.7 01/16/2023   -not on ACE/ARB  -diet changes including salt restriction -limit eating outside -counseled BP targets per standards of diabetes care -uncontrolled blood pressure can lead to retinopathy, nephropathy and cardiovascular and atherosclerotic heart disease  Reviewed and  counseled on: -A1C target -Blood sugar targets -Complications of uncontrolled diabetes  -Checking blood sugar before meals and bedtime and bring log next visit -All medications with mechanism of  action and side effects -Hypoglycemia management: rule of 15's, Glucagon Emergency Kit and medical alert ID -low-carb low-fat plate-method diet -At least 20 minutes of physical activity per day -Annual dilated retinal eye exam and foot exam -compliance and follow up needs -follow up as scheduled or earlier if problem gets worse  Call if blood sugar is less than 70 or consistently above 250    Take a 15 gm snack of carbohydrate at bedtime before you go to sleep if your blood sugar is less than 100.    If you are going to fast after midnight for a test or procedure, ask your physician for instructions on how to reduce/decrease your insulin  dose.    Call if blood sugar is less than 70 or consistently above 250  -Treating a low sugar by rule of 15  (15 gms of sugar every 15 min until sugar is more than 70) If you feel your sugar is low, test your sugar to be sure If your sugar is low (less than 70), then take 15 grams of a fast acting Carbohydrate (3-4 glucose tablets or glucose gel or 4 ounces of juice or regular soda) Recheck your sugar 15 min after treating low to make sure it is more than 70 If sugar is still less than 70, treat again with 15 grams of carbohydrate          Don't drive the hour of hypoglycemia  If unconscious/unable to eat or drink by mouth, use glucagon injection or nasal spray baqsimi and call 911. Can repeat again in 15 min if still unconscious.  Return in about 15 days (around 10/06/2023) for tele-visit.   I have reviewed current medications, nurse's notes, allergies, vital signs, past medical and surgical history, family medical history, and social history for this encounter. Counseled patient on symptoms, examination findings, lab findings, imaging results, treatment decisions and monitoring and prognosis. The patient understood the recommendations and agrees with the treatment plan. All questions regarding treatment plan were fully answered.  Jorge Newcomer, MD   09/21/23  History of Present Illness Emily [name] is a 43 y.o. year old female who presents for follow up of Type II diabetes mellitus.  Emily [name] was first diagnosed around 2012.   Diabetes education -  Home diabetes regimen: Lantus  22 units once every morning Humalog  10 units 15 min before each meal    COMPLICATIONS -  MI/Stroke -  retinopathy -  neuropathy -  nephropathy  BLOOD SUGAR DATA  CGM interpretation: At today's visit, we reviewed her CGM downloads. The full report is scanned in the media. Reviewing the CGM trends, BG are elevated across the day.  Physical Exam  LMP  (LMP Unknown)    Constitutional: well developed, well nourished Head: normocephalic, atraumatic Eyes: sclera anicteric, no redness Neck: supple Lungs: normal respiratory effort Neurology: alert and oriented Skin: dry, no appreciable rashes Musculoskeletal: no appreciable defects Psychiatric: normal mood and affect Diabetic Foot Exam - Simple   No data filed      Current Medications Patient's Medications  New Prescriptions   METFORMIN  (GLUCOPHAGE -XR) 500 MG 24 HR TABLET    Take 1 tablet (500 mg total) by mouth 2 (two) times daily with a meal.  Previous Medications   ACCU-CHEK AVIVA PLUS TEST STRIP    1 each by  Other route 3 (three) times daily.   ACCU-CHEK FASTCLIX LANCETS MISC    USE AS DIRECTED   ALBUTEROL  (VENTOLIN  HFA) 108 (90 BASE) MCG/ACT INHALER    Inhale 2 puffs into the lungs every 6 (six) hours as needed for wheezing or shortness of breath.   AMLODIPINE  (NORVASC ) 5 MG TABLET    TAKE 1 TABLET (5 MG TOTAL) BY MOUTH DAILY.   AZITHROMYCIN  (ZITHROMAX ) 250 MG TABLET    Take 2 tablets on day 1, then 1 tablet daily on days 2 through 5   BENZONATATE  (TESSALON ) 100 MG CAPSULE    Take 1 capsule (100 mg total) by mouth 3 (three) times daily as needed for cough.   BLOOD GLUCOSE MONITORING SUPPL (ACCU-CHEK AVIVA PLUS) W/DEVICE KIT    USE AS DIRECTED   BLOOD GLUCOSE MONITORING SUPPL  DEVI    1 each by Does not apply route in the morning, at noon, and at bedtime. May substitute to any manufacturer covered by patient's insurance.   CLONAZEPAM (KLONOPIN) 1 MG TABLET    Take 1 mg by mouth 2 (two) times daily.   CONTINUOUS GLUCOSE RECEIVER (DEXCOM G7 RECEIVER) DEVI    1 Device by Does not apply route continuous.   CONTINUOUS GLUCOSE SENSOR (DEXCOM G7 SENSOR) MISC    USE AS DIRECTED   CYCLOBENZAPRINE (FLEXERIL) 10 MG TABLET    Take 1 tablet (10 mg total) by mouth 3 (three) times daily as needed.   FLUTICASONE  (FLONASE ) 50 MCG/ACT NASAL SPRAY    Place 2 sprays into both nostrils daily.   FLUTICASONE -SALMETEROL (ADVAIR) 250-50 MCG/ACT AEPB    Inhale 1 puff into the lungs every 12 (twelve) hours. Rinse after use.   INSULIN  LISPRO (HUMALOG  KWIKPEN) 100 UNIT/ML KWIKPEN    Max daily 45 units   INSULIN  PEN NEEDLE 32G X 4 MM MISC    1 Device by Does not apply route in the morning, at noon, in the evening, and at bedtime.   IPRATROPIUM (ATROVENT ) 0.03 % NASAL SPRAY    Place 2 sprays into both nostrils every 12 (twelve) hours.   IPRATROPIUM-ALBUTEROL  (DUONEB) 0.5-2.5 (3) MG/3ML SOLN    Take 3 mLs by nebulization in the morning, at noon, and at bedtime.   LAMOTRIGINE (LAMICTAL) 150 MG TABLET    Take 150 mg by mouth daily.   LEVOTHYROXINE  (SYNTHROID ) 75 MCG TABLET    TAKE 1 TABLET BY MOUTH EVERY DAY BEFORE BREAKFAST   MEGESTROL  (MEGACE ) 40 MG TABLET    3 tablets a day for 5 days, 2 tablets a day for 5 days then 1 tablet daily   MONTELUKAST  (SINGULAIR ) 10 MG TABLET    TAKE 1 TABLET BY MOUTH EVERYDAY AT BEDTIME   NAPROXEN  (NAPROSYN ) 500 MG TABLET    Take 1 tablet (500 mg total) by mouth 2 (two) times daily with a meal.   NEBULIZERS (COMPRESSOR/NEBULIZER) MISC    Use as directed   NITROGLYCERIN  (NITROSTAT ) 0.4 MG SL TABLET    Place 1 tablet (0.4 mg total) under the tongue every 5 (five) minutes as needed for chest pain.   NYSTATIN  CREAM (MYCOSTATIN )    Apply to affected area 2 times daily    OMEPRAZOLE  (PRILOSEC) 40 MG CAPSULE    Take 40 mg by mouth every morning.   PAROXETINE (PAXIL) 10 MG TABLET    Take 10 mg by mouth at bedtime.   PREDNISONE  (DELTASONE ) 20 MG TABLET    60 mg x3d, 40 mg x3d, 20 mg x2d, 10 mg x2d  PROMETHAZINE (PHENERGAN) 12.5 MG TABLET    Take 12.5 mg by mouth every 8 (eight) hours as needed for vomiting or nausea.   PROPRANOLOL  (INDERAL ) 20 MG TABLET    Take 1 tablet (20 mg total) by mouth 3 (three) times daily.   PSEUDOEPHEDRINE  (SUDAFED) 120 MG 12 HR TABLET    Take 1 tablet (120 mg total) by mouth 2 (two) times daily.   RESPIRATORY THERAPY SUPPLIES (NEBULIZER/TUBING/MOUTHPIECE) KIT    Use as directed   ROSUVASTATIN  (CRESTOR ) 5 MG TABLET    Take 1 tablet (5 mg total) by mouth daily.   SOLIFENACIN  (VESICARE ) 5 MG TABLET    Take 1 tablet (5 mg total) by mouth daily.   VITAMIN D , ERGOCALCIFEROL , (DRISDOL ) 1.25 MG (50000 UNIT) CAPS CAPSULE    Take 50,000 Units by mouth every 7 (seven) days.   ZIPRASIDONE (GEODON) 60 MG CAPSULE    Take 60 mg by mouth at bedtime.  Modified Medications   No medications on file  Discontinued Medications   EMPAGLIFLOZIN-METFORMIN  HCL ER (SYNJARDY  XR) 12.08-998 MG TB24    Take 2 tablets by mouth daily.    Allergies Allergies  Allergen Reactions   Bactrim  [Sulfamethoxazole -Trimethoprim ] Anaphylaxis and Other (See Comments)    Chest pain, trouble breathing   Ciprofloxacin Palpitations   Clindamycin /Lincomycin     Other reaction(s): Chest Pain   Flagyl  [Metronidazole ] Shortness Of Breath   Hydrocodone Other (See Comments)   Iodinated Contrast Media Swelling   Ondansetron Shortness Of Breath   Liraglutide  Nausea And Vomiting   Doxycycline  Nausea And Vomiting   Penicillins    Synjardy  Xr [Empagliflozin-Metformin  Hcl Er]     Past Medical History Past Medical History:  Diagnosis Date   Abnormal CT of liver    Anxiety    Asthma    Asthma    Bipolar 1 disorder (HCC)    Bipolar disorder (HCC)    Chronic pain    Cirrhosis  (HCC)    Depression    Diabetes mellitus without complication (HCC)    Fatty liver    Gallstones    GERD (gastroesophageal reflux disease)    Headache(784.0)    Hyperlipidemia    Hypertension    Hypothyroidism    Lupus (systemic lupus erythematosus) (HCC)    Pulmonary nodule    Tachycardia    Thyroid disease    Vitamin D  deficiency     Past Surgical History Past Surgical History:  Procedure Laterality Date   DILATION AND CURETTAGE OF UTERUS     1 yr ago   UPPER GI ENDOSCOPY  08/28/2022    Family History family history includes Asthma in her mother; Diabetes in her maternal grandmother; Hypertension in her maternal grandmother; Valvular heart disease in her father.  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   Drug use: No   Sexual activity: Not Currently    Partners: Male    Birth control/protection: Abstinence  Other Topics Concern   Not on file  Social History Narrative   Not on file   Social Drivers of Health   Financial Resource Strain: Low Risk  (07/13/2023)   Received from [facility], [facility]   Overall Financial Resource Strain (CARDIA)    Difficulty of Paying Living Expenses: Not hard at all  Food Insecurity: No Food Insecurity (07/13/2023)   Received from [facility], [facility]   Hunger Vital Sign    Worried About Running Out of Food in the Last Year: Never true    Ran  Out of Food in the Last Year: Never true  Transportation Needs: No Transportation Needs (07/13/2023)   Received from [facility], [facility]   PRAPARE - Transportation    Lack of Transportation (Medical): No    Lack of Transportation (Non-Medical): No  Physical Activity: Unknown (07/13/2023)   Received from [facility], [facility]   Exercise Vital Sign    Days of Exercise per Week: Patient declined    Minutes of Exercise per Session: 0 min  Recent Concern: Physical Activity - Insufficiently Active (05/28/2023)   Exercise Vital Sign    Days of Exercise per Week: 1 day    Minutes of Exercise per Session: 20 min  Stress: No Stress Concern Present (07/13/2023)   Received from [facility], [facility] - Occupational Stress Questionnaire    Feeling of Stress : Not at all  Social Connections: Socially Integrated (07/13/2023)   Received from [facility], [facility]   Social Network    How would you rate your social network (family, work, friends)?: Good participation with social networks  Recent Concern: Social Connections - Socially Isolated (05/28/2023)   Social Connection and Isolation Panel [NHANES]    Frequency of Communication with Friends and Family: Three times a week    Frequency of Social Gatherings with Friends and Family: Once a week    Attends Religious Services: Never    Database administrator or Organizations: No    Attends Banker Meetings: Never    Marital Status: Never married  Intimate Partner Violence: Not At Risk (07/13/2023)   Received from [facility], [facility]   HITS    Over the last 12 months how often did your partner physically hurt you?: Never    Over the last 12 months how often did your partner insult you or talk down to you?: Never    Over the last 12 months how often did your partner threaten you with physical harm?: Never    Over the last 12 months how often did your partner scream  or curse at you?: Never    Lab Results  Component Value Date   HGBA1C 10.9 (H) 05/20/2023   HGBA1C 11.0 (H) 01/16/2023   HGBA1C 9.7 (H) 09/24/2022   Lab Results  Component Value Date   CHOL 223 (H) 01/16/2023   Lab Results  Component Value Date   HDL 34.80 (L) 01/16/2023   Lab Results  Component Value Date   LDLCALC 102 (H) 07/16/2021   Lab Results  Component Value Date   TRIG (H) 01/16/2023    411.0 Triglyceride is over 400; calculations on Lipids are invalid.   Lab Results  Component Value Date   CHOLHDL 6 01/16/2023   Lab Results  Component Value Date   CREATININE 0.58 09/20/2023   Lab Results  Component Value Date   GFR 108.36 01/22/2023   Lab Results  Component Value Date   MICROALBUR <0.7 01/16/2023      Component Value Date/Time   NA 133 (L) 09/20/2023 0241   NA 135 05/02/2022 1617   K 3.7 09/20/2023 0241   CL 98 09/20/2023 0241   CO2 24 09/20/2023 0241   GLUCOSE 127 (H) 09/20/2023 0241   BUN <5 (L) 09/20/2023 0241   BUN 5 (L) 05/02/2022 1617   CREATININE 0.58 09/20/2023 0241   CREATININE 0.60 02/20/2015 0001   CALCIUM  9.3 09/20/2023 0241   PROT 7.5 08/28/2023 0251   PROT 7.0 07/16/2021 1412   ALBUMIN 3.9 08/28/2023 0251   ALBUMIN 3.6 (L) 07/16/2021 1412   AST 75 (H) 08/28/2023 0251   ALT 31 08/28/2023 0251   ALKPHOS 224 (H) 08/28/2023 0251   BILITOT 0.9 08/28/2023 0251   BILITOT 0.7 07/16/2021 1412   GFRNONAA >60 09/20/2023 0241   GFRAA 129 06/01/2018 1114      Latest Ref Rng & Units 09/20/2023    2:41 AM 08/28/2023    2:51 AM 01/22/2023    1:35 PM  BMP  Glucose 70 - 99 mg/dL 161  096  045   BUN 6 - 20 mg/dL <5  <5  5   Creatinine 0.44 - 1.00 mg/dL 4.09  8.11  9.14   Sodium 135 - 145 mmol/L 133  136  129   Potassium 3.5 - 5.1 mmol/L 3.7  3.6  4.3   Chloride 98 - 111 mmol/L 98  99  96   CO2 22 - 32 mmol/L 24  26  26    Calcium  8.9 - 10.3 mg/dL 9.3  9.5  8.8        Component Value Date/Time   WBC 8.7 09/20/2023 0241   RBC 4.41  09/20/2023 0241   HGB 14.4 09/20/2023 0241   HGB 11.9 05/02/2022 1617   HCT 42.2 09/20/2023 0241   HCT 33.9 (L) 05/02/2022 1617   PLT 162 09/20/2023 0241   PLT 221 05/02/2022 1617   MCV 95.7 09/20/2023 0241   MCV 115 (H) 05/02/2022 1617   MCH 32.7 09/20/2023 0241   MCHC 34.1 09/20/2023 0241   RDW 13.3 09/20/2023 0241   RDW 11.9 05/02/2022 1617   LYMPHSABS 5.0 (H) 09/20/2023 0241   MONOABS 0.5 09/20/2023 0241   EOSABS 0.2 09/20/2023 0241   BASOSABS 0.1 09/20/2023 0241     Parts of this note may have been dictated using voice recognition software. There may be variances in spelling and vocabulary which are unintentional. Not all errors are proofread. Please notify the [facility] if any discrepancies are noted or if the meaning of any statement is not clear.

## 2023-09-22 ENCOUNTER — Encounter: Payer: Self-pay | Admitting: "Endocrinology

## 2023-09-22 ENCOUNTER — Ambulatory Visit: Admitting: Internal Medicine

## 2023-09-22 ENCOUNTER — Encounter: Payer: Self-pay | Admitting: Internal Medicine

## 2023-09-22 VITALS — HR 77 | Ht 63.0 in | Wt 192.1 lb

## 2023-09-22 DIAGNOSIS — J45909 Unspecified asthma, uncomplicated: Secondary | ICD-10-CM | POA: Diagnosis not present

## 2023-09-22 DIAGNOSIS — R918 Other nonspecific abnormal finding of lung field: Secondary | ICD-10-CM

## 2023-09-22 DIAGNOSIS — F17210 Nicotine dependence, cigarettes, uncomplicated: Secondary | ICD-10-CM | POA: Diagnosis not present

## 2023-09-22 DIAGNOSIS — R768 Other specified abnormal immunological findings in serum: Secondary | ICD-10-CM

## 2023-09-22 DIAGNOSIS — F17200 Nicotine dependence, unspecified, uncomplicated: Secondary | ICD-10-CM

## 2023-09-22 NOTE — Patient Instructions (Addendum)
 Multiple lung nodules on CT  -Nodules in April 2025  Plan  - repeat CT chest wo contrast in NOv 2025  TOBACCO ABUSE  - glad you want to quit  Plan  -pls work on quitting smoking  Asthma  -Currently well-controlled plan - Continue current inhalers  Positive ANA Oct 2024  Plan  -repeeat  Follow-up - dec 2025 after CT

## 2023-09-22 NOTE — Progress Notes (Signed)
 07/22/2022 Follow up: Chronic bronchitis, Abnormal Chest  43 yo female active smoker-Heavy smoking history followed for Chronic bronchitis and rhinitis , abnormal CT chest  Medical history significant for DM, Bipolar disorder, and panic disorder   Patient presents for a 53-month follow-up.  Patient has a very heavy smoking history.  She is treated for chronic bronchitis with previous spirometry showing severe restrictive lung disease.  Patient has been recommended for inhalers in the past with Breztri  and Incruse unfortunately patient says that she did not get these for multiple reasons.  She does take DuoNeb 3 times daily.  She continues to smoke.  We discussed smoking cessation.  Previous CT chest chest showed possible smoking-related bronchiolitis.  With fine nodularity in the lung apices.  Previous high-resolution CT chest in April 2022 showed stable scarring with no evidence of interstitial lung disease. Patient recently had acute flare of bronchitis.  With increased cough and congestion.  She was given a Z-Pak.  She is on day 4 of 5.  She says she is feeling much better.  Cough and congestion have almost totally resolved.  Patient has been recommended to follow-up with dentistry multiple times and she has multiple dental caries and broken teeth.  She is prone to recurrent bronchitis. She also has been recommended to return for pulmonary function testing on multiple occasions but unfortunately has canceled all the follow-ups.  We discussed again how important it is for us  to follow-up on her lung function.  Patient recently had some GI issues and had a CT abdomen pelvis that showed some lung nodularity.  She had a dedicated CT chest completed on July 10, 2022.  Report is in care everywhere that showed scattered bilateral pulmonary nodules measuring up to 8 mm and faint groundglass opacities and nodularity in the upper lobes.  Mild mediastinal adenopathy.  Patient has been recommended for a  75-month CT follow-up.  She denies any hemoptysis or unintentional weight loss.  Previous CT scans have shown coronary atherosclerosis.  She has been referred to cardiology and is following.  She recently had a coronary CT chest in November 2023 that was nondiagnostic due to movement.  Patient has been recommend to follow up for repeat scan.     TEST/EVENTS :  Labs: 2020 :  Ace - 81 Esr - 30 Rh<14 CCP<16 ANCA - negative  Hypersensitive activity pneumonitis panel negative Anti-Jo 1 less than 0.2 RNP antibodies greater than 8 Sjogren's syndrome antibodies negative Anti-scleroderma antibody less than 1 Aldolase 4.5 MPO/PR-3 (ANCA) antibodies-less than 1 Total CK 47, CK-MB less than 7 -negative   04/01/2018-CT chest high resolution- few patchy areas of mild groundglass attenuation in the lungs bilaterally nonspecific, stable pulmonary nodules from 2009    feNO - 01/26/2018 - 5 ppbd and is normal   01/08/2015- spirometry- FVC 1.33 (38% predicted), ratio 72, FEV1 1.01 (37% predicted)   High-resolution CT chest July 25, 2020 bandlike scarring left base, no evidence of interstitial lung disease, mild air trapping, fine centrilobular nodularity concentrated in the lung apices consistent with smoking-related bronchiolitis. Neg for ILD .   OV 12/12/2022  Subjective:  Patient ID: Emily [name], female , DOB: Aug 29, 1980 , age 32 y.o. , MRN: [id number] , ADDRESS: [address] [place] [address] PCP Francenia Ingle, NP Patient Care Team: Francenia Ingle, NP as PCP - General (Family Medicine) Eilleen Grates, MD as PCP - Cardiology (Cardiology) Javan Messing, NP as Nurse Practitioner (Obstetrics and  Gynecology) [place], [facility] Maire Scot, MD as Consulting Physician (Pulmonary Disease) Nahser, Lela Purple, MD as Consulting Physician (Cardiology)  This Provider for this visit: Treatment Team:  Attending Provider: Maire Scot,  MD    12/12/2022 -   Chief Complaint  Patient presents with   Follow-up    F/up on CT scan     HPI Emily [name] 43 y.o. -returns for follow-up.  I personally not seen in a few years.  She is here with her dad.  Her mother Isreal Marinas was also my patient for sarcoid.  She is always been worried about lung nodules and lung cancer.  Currently recently they tell me that she was artificially drinking and last he was diagnosed with cirrhosis of the liver.  She gets her care at [facility].  According to history there is no varices.  We really worried about the cirrhosis.  She is also got morbid obesity.  She does not know or realize that NASH could be a cause of the cirrhosis.  She did try to lose weight with the help of Mounjaro  but this gives side effects.  I have encouraged her to talk to primary care about further other weight loss drugs.    Most recently in 2024 for June she had a CT scan for lung nodules and there are new or nodules.  This requires follow-up.  I personally visualized and showed it to her.  She is in agreement.  In terms of asthma: Is under good control  Smoking: The dad is really worried that if she continues to smoke she will get lung diseases and also be contraindicated for the transplant on account of cirrhosis.  She wants to do quit smoking drugs.  She smokes 1 pack of cigarettes a day.  We discussed Chantix  and she is interested.  She does not have any firearms no suicidal ideations.  No concomitant alcohol.  We talked about her reducing smoking by 1 cigarette a day in each pack and therefore should be completely quit in 3 weeks.  She is open to this idea. > 3 min in quit smoking effforts  CT Chest data from date: *10/22/22  - personally visualized and independently interpreted : YES - my findings are: SAME AS BELOW Narrative & Impression  CLINICAL DATA:  Abnormal CT chest, smoker.  Cough.   EXAM: CT CHEST WITHOUT CONTRAST   TECHNIQUE: Multidetector CT imaging of  the chest was performed following the standard protocol without IV contrast.   RADIATION DOSE REDUCTION: This exam was performed according to the departmental dose-optimization program which includes automated exposure control, adjustment of the mA and/or kV according to patient size and/or use of iterative reconstruction technique.   COMPARISON:  Cardiac CT 03/12/2022 and CT chest 07/25/2020.   FINDINGS: Cardiovascular: Left anterior descending coronary artery calcification. Heart is enlarged. No pericardial effusion.   Mediastinum/Nodes: Mediastinal lymph nodes measure up to 10 mm in the low right paratracheal station, as on 07/25/2020. Hilar regions are difficult to definitively evaluate without IV contrast. No axillary adenopathy. Esophagus is grossly unremarkable.   Lungs/Pleura: Smoking related respiratory bronchiolitis. Multiple bilateral pulmonary nodules, many of which are new from 07/25/2020 and measure up to 5 mm in the right upper lobe (4/41). Subsegmental volume loss in both lower lobes. No pleural fluid. Airway is unremarkable.   Upper Abdomen: Liver margin is irregular with hypertrophy of the left hepatic lobe and caudate. Visualized portions of the liver, gallbladder, adrenal glands and  kidneys are otherwise unremarkable. Spleen measures approximately 13.7 cm. Spleen, pancreas and stomach are otherwise grossly unremarkable. Small to borderline enlarged upper abdominal lymph nodes, as on 07/25/2020.   Musculoskeletal: Degenerative changes in the spine.   IMPRESSION: 1. Multiple bilateral pulmonary nodules measure 5 mm or less in size, many of which are new from 07/25/2020. Non-contrast chest CT is recommended in 12 months as the patient is at increased risk for bronchogenic carcinoma. This recommendation follows the consensus statement: Guidelines for Management of Incidental Pulmonary Nodules Detected on CT Images: From the Fleischner Society 2017;  Radiology 2017; 284:228-243. 2. Age advanced left anterior descending coronary artery calcification. 3. Cirrhosis. 4. Splenomegaly.     Electronically Signed   By: Shearon Denis M.D.   On: 10/22/2022 08:42    OV 09/22/2023  Subjective:  Patient ID: Emily [name], female , DOB: 30-Nov-1980 , age 49 y.o. , MRN: [id number] , ADDRESS: [address] [place] [address] PCP Francenia Ingle, NP Patient Care Team: Francenia Ingle, NP as PCP - General (Family Medicine) [place], [facility] Maire Scot, MD as Consulting Physician (Pulmonary Disease) Nahser, Lela Purple, MD as Consulting Physician (Cardiology) Eilleen Grates, MD as Consulting Physician (Cardiology) Javan Messing, NP as Nurse Practitioner (Obstetrics and Gynecology)  This Provider for this visit: Treatment Team:  Attending Provider: Maire Scot, MD    09/22/2023 -   Chief Complaint  Patient presents with   Shortness of Breath     HPI Emily [name] 43 y.o. -presents acutely and returns for follow-up with her dad.  She is finished a course of azithromycin  but she was still feeling symptomatic so we advised her to come in.  She tells me that she is declaring a new symptom but it is actually chronic.  To be there for a year.  She states that the back of her chest she feels tight or dry but not a pleuritic pain.  Made worse by smoking or exertion and decreased by nebulizers.  There is no fever or weight loss.  There is no burning sensation there is no hemoptysis.  Last time I gave her Chantix  but she is not able to quit.  She continues all her inhalers otherwise.  She did see rheumatology and autoimmune disease has been ruled out but in October 2024 ANA was 1: 320.  She is agreed to get this retested today.      LAB RESULTS last 96 hours US  Venous Img Lower Bilateral (DVT) Result Date: 09/21/2023 CLINICAL DATA:  Bilateral calf pain x1 day wall walking.  History of tobacco use. EXAM: Bilateral LOWER EXTREMITY VENOUS DOPPLER ULTRASOUND TECHNIQUE: Gray-scale sonography with compression, as well as color and duplex ultrasound, were performed to evaluate the deep venous system(s) from the level of the common femoral vein through the popliteal and proximal calf veins. COMPARISON:  None Available. FINDINGS: VENOUS Normal compressibility of the common femoral, superficial femoral, and popliteal veins, as well as the visualized calf veins. Visualized portions of profunda femoral vein and great saphenous vein unremarkable. No filling defects to suggest DVT on grayscale or color Doppler imaging. Doppler waveforms show normal direction of venous flow, normal respiratory plasticity and response to augmentation. Limited views of the contralateral common femoral vein are unremarkable. OTHER None. Limitations: none IMPRESSION: Negative for bilateral lower extremity DVT. Electronically Signed   By: Susan Ensign   On: 09/21/2023 13:47   DG Tibia/Fibula Left Result Date: 09/20/2023 EXAM: 2 VIEW(S) XRAY  OF THE LEFT TIBIA AND FIBULA 2023-09-20 01:30:00 COMPARISON: None available. CLINICAL HISTORY: FINDINGS: BONES AND JOINTS: No acute fracture. No focal osseous lesion. No joint dislocation. SOFT TISSUES: The soft tissues are unremarkable. IMPRESSION: 1. No acute osseous abnormality. Electronically signed by: Zadie Herter MD 09/20/2023 02:59 AM EDT RP Workstation: [id number]   DG Tibia/Fibula Right Result Date: 09/20/2023 EXAM: 3 VIEW(S) XRAY OF THE KNEE 09/20/2023 COMPARISON: None available. CLINICAL HISTORY: FINDINGS: BONES AND JOINTS: No acute fracture. No focal osseous lesion. No joint dislocation. Mild degenerative changes of the medial compartment of the knee. SOFT TISSUES: The soft tissues are unremarkable. IMPRESSION: 1. No acute osseous abnormality. Electronically signed by: Zadie Herter MD 09/20/2023 02:59 AM EDT RP Workstation: [id number]     Latest  Reference Range & Units 08/13/23 13:47  Ribonucleic Protein(ENA) Antibody, IgG <1.0 NEG AI 1.9 POS !  !: Data is abnormal     has a past medical history of Abnormal CT of liver, Anxiety, Asthma, Asthma, Bipolar 1 disorder (HCC), Bipolar disorder (HCC), Chronic pain, Cirrhosis (HCC), Depression, Diabetes mellitus without complication (HCC), Fatty liver, Gallstones, GERD (gastroesophageal reflux disease), Headache(784.0), Hyperlipidemia, Hypertension, Hypothyroidism, Lupus (systemic lupus erythematosus) (HCC), Pulmonary nodule, Tachycardia, Thyroid disease, and Vitamin D  deficiency.   reports that she has been smoking cigarettes. She has a 20 pack-year smoking history. She has never been exposed to tobacco smoke. She has never used smokeless tobacco.  Past Surgical History:  Procedure Laterality Date   DILATION AND CURETTAGE OF UTERUS     1 yr ago   UPPER GI ENDOSCOPY  08/28/2022    Allergies  Allergen Reactions   Bactrim  [Sulfamethoxazole -Trimethoprim ] Anaphylaxis and Other (See Comments)    Chest pain, trouble breathing   Ciprofloxacin Palpitations   Clindamycin /Lincomycin     Other reaction(s): Chest Pain   Flagyl  [Metronidazole ] Shortness Of Breath   Hydrocodone Other (See Comments)   Iodinated Contrast Media Swelling   Ondansetron Shortness Of Breath   Liraglutide  Nausea And Vomiting   Doxycycline  Nausea And Vomiting   Penicillins    Synjardy  Xr [Empagliflozin-Metformin  Hcl Er]     Immunization History  Administered Date(s) Administered   Influenza Split 03/22/2014   Influenza Whole 01/13/2008   Influenza, Quadrivalent, Recombinant, Inj, Pf 01/23/2022   Influenza, Seasonal, Injecte, Preservative Fre 04/18/2015   Influenza,inj,Quad PF,6+ Mos 04/18/2015, 01/08/2022   PFIZER(Purple Top)SARS-COV-2 Vaccination 03/12/2020, 04/16/2020   Pneumococcal Polysaccharide-23 05/10/2012   Tdap 01/02/2006, 08/08/2015    Family History  Problem Relation Age of Onset   Asthma Mother     Valvular heart disease Father        Mitral valve repair Dr. Alva Jewels 2007   Diabetes Maternal Grandmother    Hypertension Maternal Grandmother      Current Outpatient Medications:    ACCU-CHEK AVIVA PLUS test strip, 1 each by Other route 3 (three) times daily., Disp: , Rfl:    ACCU-CHEK FASTCLIX LANCETS MISC, USE AS DIRECTED, Disp: 102 each, Rfl: 3   albuterol  (VENTOLIN  HFA) 108 (90 Base) MCG/ACT inhaler, Inhale 2 puffs into the lungs every 6 (six) hours as needed for wheezing or shortness of breath., Disp: 18 g, Rfl: 3   amLODipine  (NORVASC ) 5 MG tablet, TAKE 1 TABLET (5 MG TOTAL) BY MOUTH DAILY., Disp: 90 tablet, Rfl: 3   benzonatate  (TESSALON ) 100 MG capsule, Take 1 capsule (100 mg total) by mouth 3 (three) times daily as needed for cough., Disp: 30 capsule, Rfl: 0   Blood Glucose Monitoring Suppl (ACCU-CHEK AVIVA PLUS) w/Device KIT, USE  AS DIRECTED, Disp: 1 kit, Rfl: 0   Blood Glucose Monitoring Suppl DEVI, 1 each by Does not apply route in the morning, at noon, and at bedtime. May substitute to any manufacturer covered by patient's insurance., Disp: 1 each, Rfl: 0   clonazePAM (KLONOPIN) 1 MG tablet, Take 1 mg by mouth 2 (two) times daily., Disp: , Rfl:    Continuous Glucose Receiver (DEXCOM G7 RECEIVER) DEVI, 1 Device by Does not apply route continuous., Disp: 1 each, Rfl: 0   Continuous Glucose Sensor (DEXCOM G7 SENSOR) MISC, USE AS DIRECTED, Disp: 9 each, Rfl: 0   cyclobenzaprine (FLEXERIL) 10 MG tablet, Take 1 tablet (10 mg total) by mouth 3 (three) times daily as needed., Disp: 15 tablet, Rfl: 0   fluticasone  (FLONASE ) 50 MCG/ACT nasal spray, Place 2 sprays into both nostrils daily., Disp: 16 g, Rfl: 6   fluticasone -salmeterol (ADVAIR) 250-50 MCG/ACT AEPB, Inhale 1 puff into the lungs every 12 (twelve) hours. Rinse after use., Disp: 60 each, Rfl: 5   insulin  lispro (HUMALOG  KWIKPEN) 100 UNIT/ML KwikPen, Max daily 45 units, Disp: 30 mL, Rfl: 4   Insulin  Pen Needle 32G X 4 MM MISC, 1  Device by Does not apply route in the morning, at noon, in the evening, and at bedtime., Disp: 400 each, Rfl: 3   ipratropium (ATROVENT ) 0.03 % nasal spray, Place 2 sprays into both nostrils every 12 (twelve) hours., Disp: 30 mL, Rfl: 0   ipratropium-albuterol  (DUONEB) 0.5-2.5 (3) MG/3ML SOLN, Take 3 mLs by nebulization in the morning, at noon, and at bedtime., Disp: 270 mL, Rfl: 11   lamoTRIgine (LAMICTAL) 150 MG tablet, Take 150 mg by mouth daily., Disp: , Rfl:    levothyroxine  (SYNTHROID ) 75 MCG tablet, TAKE 1 TABLET BY MOUTH EVERY DAY BEFORE BREAKFAST, Disp: 90 tablet, Rfl: 0   megestrol  (MEGACE ) 40 MG tablet, 3 tablets a day for 5 days, 2 tablets a day for 5 days then 1 tablet daily, Disp: 45 tablet, Rfl: 3   metFORMIN  (GLUCOPHAGE -XR) 500 MG 24 hr tablet, Take 1 tablet (500 mg total) by mouth 2 (two) times daily with a meal., Disp: 60 tablet, Rfl: 5   montelukast  (SINGULAIR ) 10 MG tablet, TAKE 1 TABLET BY MOUTH EVERYDAY AT BEDTIME, Disp: 90 tablet, Rfl: 3   naproxen  (NAPROSYN ) 500 MG tablet, Take 1 tablet (500 mg total) by mouth 2 (two) times daily with a meal. (Patient taking differently: Take 500 mg by mouth daily as needed for moderate pain (pain score 4-6).), Disp: 30 tablet, Rfl: 0   Nebulizers (COMPRESSOR/NEBULIZER) MISC, Use as directed, Disp: 1 each, Rfl: 0   nitroGLYCERIN  (NITROSTAT ) 0.4 MG SL tablet, Place 1 tablet (0.4 mg total) under the tongue every 5 (five) minutes as needed for chest pain., Disp: 25 tablet, Rfl: 3   nystatin  cream (MYCOSTATIN ), Apply to affected area 2 times daily, Disp: 30 g, Rfl: 0   omeprazole  (PRILOSEC) 40 MG capsule, Take 40 mg by mouth every morning., Disp: , Rfl:    PARoxetine (PAXIL) 10 MG tablet, Take 10 mg by mouth at bedtime., Disp: , Rfl:    predniSONE  (DELTASONE ) 20 MG tablet, 60 mg x3d, 40 mg x3d, 20 mg x2d, 10 mg x2d, Disp: 18 tablet, Rfl: 0   promethazine (PHENERGAN) 12.5 MG tablet, Take 12.5 mg by mouth every 8 (eight) hours as needed for vomiting  or nausea., Disp: , Rfl:    propranolol  (INDERAL ) 20 MG tablet, Take 1 tablet (20 mg total) by mouth 3 (three)  times daily., Disp: 270 tablet, Rfl: 3   pseudoephedrine  (SUDAFED) 120 MG 12 hr tablet, Take 1 tablet (120 mg total) by mouth 2 (two) times daily., Disp: 20 tablet, Rfl: 0   Respiratory Therapy Supplies (NEBULIZER/TUBING/MOUTHPIECE) KIT, Use as directed, Disp: 10 kit, Rfl: 11   rosuvastatin  (CRESTOR ) 5 MG tablet, Take 1 tablet (5 mg total) by mouth daily., Disp: 90 tablet, Rfl: 1   solifenacin  (VESICARE ) 5 MG tablet, Take 1 tablet (5 mg total) by mouth daily., Disp: 30 tablet, Rfl: 2   Vitamin D , Ergocalciferol , (DRISDOL ) 1.25 MG (50000 UNIT) CAPS capsule, Take 50,000 Units by mouth every 7 (seven) days., Disp: , Rfl:    ziprasidone (GEODON) 60 MG capsule, Take 60 mg by mouth at bedtime., Disp: , Rfl:       Objective:   Vitals:   09/22/23 1511  Pulse: 77  SpO2: 97%  Weight: 192 lb 2 oz (87.1 kg)  Height: 5\' 3"  (1.6 m)    Estimated body mass index is 34.03 kg/m as calculated from the following:   Height as of this encounter: 5\' 3"  (1.6 m).   Weight as of this encounter: 192 lb 2 oz (87.1 kg).  @WEIGHTCHANGE @  [employer]   09/22/23 1511  Weight: 192 lb 2 oz (87.1 kg)     Physical Exam   General: No distress. Looks well O2 at rest: no Cane present: no Sitting in wheel chair: no Frail: no Obese: no Neuro: Alert and Oriented x 3. GCS 15. Speech normal Psych: Pleasant Resp:  Barrel Chest - no.  Wheeze - no, Crackles - no, No overt respiratory distress CVS: Normal heart sounds. Murmurs - no Ext: Stigmata of Connective Tissue Disease - no HEENT: Normal upper airway. PEERL +. No post nasal drip        Assessment:       ICD-10-CM   1. Multiple lung nodules on CT  R91.8 Antinuclear Antib (ANA)    2. TOBACCO ABUSE  F17.200 Antinuclear Antib (ANA)    3. Asthma, unspecified asthma severity, unspecified whether complicated, unspecified whether persistent   J45.909 Antinuclear Antib (ANA)    4. Positive ANA (antinuclear antibody)  R76.8 Antinuclear Antib (ANA)         Plan:     Patient Instructions  Multiple lung nodules on CT  -Nodules in April 2025  Plan  - repeat CT chest wo contrast in NOv 2025  TOBACCO ABUSE  - glad you want to quit  Plan  -pls work on quitting smoking  Asthma  -Currently well-controlled plan - Continue current inhalers  Positive ANA Oct 2024  Plan  -repeeat  Follow-up - dec 2025 after CT   FOLLOWUP Return for dec 2025 with Dr Bertrum Brodie.    SIGNATURE    Emily [name], M.D., F.C.C.P,  Pulmonary and Critical Care Medicine Staff Physician, [facility] Director - Interstitial Lung Disease  Program  Pulmonary Fibrosis [facility] Network at [facility] [address] [place], [place], [address]  Pager: [phone], If no answer or between  15:00h - 7:00h: call [phone] Telephone: [phone]  3:48 PM 09/22/2023

## 2023-09-24 ENCOUNTER — Encounter: Payer: Self-pay | Admitting: "Endocrinology

## 2023-09-27 ENCOUNTER — Encounter: Payer: Self-pay | Admitting: Family Medicine

## 2023-09-27 DIAGNOSIS — L709 Acne, unspecified: Secondary | ICD-10-CM

## 2023-09-28 ENCOUNTER — Other Ambulatory Visit (HOSPITAL_COMMUNITY)
Admission: RE | Admit: 2023-09-28 | Discharge: 2023-09-28 | Disposition: A | Discharge status: Home / Self Care | Source: Ambulatory Visit | Attending: Internal Medicine | Admitting: Internal Medicine

## 2023-09-28 ENCOUNTER — Encounter: Payer: Self-pay | Admitting: Family Medicine

## 2023-09-28 ENCOUNTER — Telehealth: Payer: Self-pay | Admitting: Internal Medicine

## 2023-09-28 DIAGNOSIS — R0602 Shortness of breath: Secondary | ICD-10-CM

## 2023-09-28 DIAGNOSIS — J45909 Unspecified asthma, uncomplicated: Secondary | ICD-10-CM

## 2023-09-28 DIAGNOSIS — J411 Mucopurulent chronic bronchitis: Secondary | ICD-10-CM

## 2023-09-28 DIAGNOSIS — R768 Other specified abnormal immunological findings in serum: Secondary | ICD-10-CM | POA: Diagnosis not present

## 2023-09-28 DIAGNOSIS — R918 Other nonspecific abnormal finding of lung field: Secondary | ICD-10-CM | POA: Diagnosis not present

## 2023-09-28 DIAGNOSIS — F17200 Nicotine dependence, unspecified, uncomplicated: Secondary | ICD-10-CM | POA: Insufficient documentation

## 2023-09-28 NOTE — Telephone Encounter (Signed)
 PT wonders if she can do her blood work in the [place] area, rather than go to [place]. Please call to advise @ [phone]

## 2023-09-28 NOTE — Telephone Encounter (Signed)
 Spoke with the pt  She is wanting to go to [facility] for labs  I placed order for ANA to be done at [facility] Nothing further needed

## 2023-09-29 ENCOUNTER — Encounter: Payer: Self-pay | Admitting: Internal Medicine

## 2023-09-29 ENCOUNTER — Ambulatory Visit: Admitting: Family Medicine

## 2023-09-29 ENCOUNTER — Encounter: Admitting: "Endocrinology

## 2023-09-29 ENCOUNTER — Encounter: Payer: Self-pay | Admitting: Family Medicine

## 2023-09-29 VITALS — BP 122/80 | HR 95 | Temp 97.9°F | Ht 63.0 in | Wt 188.0 lb

## 2023-09-29 DIAGNOSIS — J029 Acute pharyngitis, unspecified: Secondary | ICD-10-CM

## 2023-09-29 DIAGNOSIS — R059 Cough, unspecified: Secondary | ICD-10-CM | POA: Diagnosis not present

## 2023-09-29 DIAGNOSIS — J069 Acute upper respiratory infection, unspecified: Secondary | ICD-10-CM

## 2023-09-29 LAB — ANA: Anti Nuclear Antibody (ANA): POSITIVE — AB

## 2023-09-29 LAB — POCT INFLUENZA A/B
Influenza A, POC: NEGATIVE
Influenza B, POC: NEGATIVE

## 2023-09-29 LAB — POCT RAPID STREP A (OFFICE): Rapid Strep A Screen: NEGATIVE

## 2023-09-29 LAB — POC COVID19 BINAXNOW: SARS Coronavirus 2 Ag: NEGATIVE

## 2023-09-29 NOTE — Patient Instructions (Signed)
 It was a pleasure seeing you today!  Cough/ sore throat Negative strep, influenza and covid.  Viral Upper respiratory Infection Continue supportive care: Tylenol and Ibuprofen as needed for sore throat, warm beverages, and rest. OTC medication for symptom management.   I hope you start to feel better.  Return if symptoms worsen or fail to improve in 1 week.

## 2023-09-29 NOTE — Progress Notes (Signed)
 Patient was a no-show

## 2023-09-29 NOTE — Progress Notes (Signed)
 Established Patient Office Visit  Subjective   Patient ID: Emily [name], female    DOB: 1980/12/05  Age: 43 y.o. MRN: [id number]  Chief Complaint  Patient presents with   Acute Visit    Sore throat  Congestion Emily [name] presents today for an acute visit.   She has complaints of sore throat followed by congestion, productive cough with clear sputum, epigastric pain, and nausea, for 2 days.Patient states that the whole family has same symptoms, but has not been diagnosed. Has taken Tylenol and advil which has not improved pain. Eating aggravates the sore throat and nothing alleviates sore throat.When pressing on the epigastric region patient states that she gets nauseous. She has not eaten today due to nausea. Denies shortness of breath, palpitations, fever, chills, fatigue, ear pain, sinus pain, headache, diarrhea, constipation, vomiting or dizziness.     Review of Systems  Constitutional:  Negative for chills, fever and malaise/fatigue.  HENT:  Positive for congestion and sore throat. Negative for ear pain and sinus pain.   Eyes:  Negative for blurred vision.  Respiratory:  Positive for cough (clear sputum) and sputum production (clear). Negative for shortness of breath and wheezing.   Cardiovascular:  Negative for chest pain and palpitations.  Gastrointestinal:  Positive for nausea. Negative for abdominal pain, constipation, diarrhea and vomiting.  Neurological:  Positive for sensory change. Negative for dizziness and headaches.      Objective:     BP 122/80   Pulse 95   Temp 97.9 F (36.6 C) (Oral)   Ht 5\' 3"  (1.6 m)   Wt 188 lb (85.3 kg)   SpO2 96%   BMI 33.30 kg/m    Physical Exam Constitutional:      Appearance: Normal appearance.  HENT:     Head: Normocephalic and atraumatic.     Right Ear: Tympanic membrane, ear canal and external ear normal.     Left Ear: Tympanic membrane, ear canal and external ear normal.     Nose: Nose normal.      Mouth/Throat:     Pharynx: Posterior oropharyngeal erythema present. No oropharyngeal exudate.     Comments: 3+ tonsil  Eyes:     Extraocular Movements: Extraocular movements intact.     Conjunctiva/sclera: Conjunctivae normal.     Pupils: Pupils are equal, round, and reactive to light.  Cardiovascular:     Rate and Rhythm: Normal rate and regular rhythm.     Pulses: Normal pulses.     Heart sounds: Normal heart sounds.  Pulmonary:     Effort: Pulmonary effort is normal.     Breath sounds: Normal breath sounds. No wheezing or rhonchi.  Abdominal:     General: Abdomen is flat. Bowel sounds are normal.     Palpations: Abdomen is soft.  Musculoskeletal:        General: Normal range of motion.     Cervical back: Normal range of motion.  Skin:    General: Skin is warm.     Capillary Refill: Capillary refill takes less than 2 seconds.  Neurological:     Mental Status: She is alert and oriented to person, place, and time.  Psychiatric:        Mood and Affect: Mood normal.        Behavior: Behavior normal.      No results found for any visits on 09/29/23.    The 10-year ASCVD risk score (Arnett DK, et al., 2019) is: 15.8%  Assessment & Plan:   Problem List Items Addressed This Visit       Other   COUGH   Other Visit Diagnoses       Sore throat    -  Primary     Viral URI with cough          Cough/ sore throat Negative strep, influenza and covid.  Viral Upper respiratory Infection Continue supportive care: Tylenol and Ibuprofen as needed for sore throat, warm beverages, and rest. OTC medication for symptom management.   Return if symptoms worsen or fail to improve.    Janeann Mean, RN

## 2023-09-30 ENCOUNTER — Encounter: Payer: Self-pay | Admitting: Internal Medicine

## 2023-09-30 ENCOUNTER — Ambulatory Visit: Payer: Self-pay | Admitting: Family Medicine

## 2023-09-30 ENCOUNTER — Telehealth: Payer: Self-pay

## 2023-09-30 ENCOUNTER — Telehealth: Admitting: Physician Assistant

## 2023-09-30 DIAGNOSIS — B9689 Other specified bacterial agents as the cause of diseases classified elsewhere: Secondary | ICD-10-CM

## 2023-09-30 DIAGNOSIS — J019 Acute sinusitis, unspecified: Secondary | ICD-10-CM | POA: Diagnosis not present

## 2023-09-30 DIAGNOSIS — R051 Acute cough: Secondary | ICD-10-CM | POA: Diagnosis not present

## 2023-09-30 DIAGNOSIS — J209 Acute bronchitis, unspecified: Secondary | ICD-10-CM

## 2023-09-30 MED ORDER — PROMETHAZINE-DM 6.25-15 MG/5ML PO SYRP: 5.0000 mL | ORAL_SOLUTION | Freq: Four times a day (QID) | ORAL | 0 refills | Status: DC | PRN

## 2023-09-30 MED ORDER — AZITHROMYCIN 250 MG PO TABS: ORAL_TABLET | ORAL | 0 refills | Status: AC

## 2023-09-30 NOTE — Progress Notes (Signed)
 Virtual Visit Consent   Emily [name], you are scheduled for a virtual visit with a [facility] provider today. Just as with appointments in the office, your consent must be obtained to participate. Your consent will be active for this visit and any virtual visit you may have with one of our providers in the next 365 days. If you have a MyChart account, a copy of this consent can be sent to you electronically.  As this is a virtual visit, video technology does not allow for your provider to perform a traditional examination. This may limit your provider's ability to fully assess your condition. If your provider identifies any concerns that need to be evaluated in person or the need to arrange testing (such as labs, EKG, etc.), we will make arrangements to do so. Although advances in technology are sophisticated, we cannot ensure that it will always work on either your end or our end. If the connection with a video visit is poor, the visit may have to be switched to a telephone visit. With either a video or telephone visit, we are not always able to ensure that we have a secure connection.  By engaging in this virtual visit, you consent to the provision of healthcare and authorize for your insurance to be billed (if applicable) for the services provided during this visit. Depending on your insurance coverage, you may receive a charge related to this service.  I need to obtain your verbal consent now. Are you willing to proceed with your visit today? Angila L Fang has provided verbal consent on 09/30/2023 for a virtual visit (video or telephone). Angelia Kelp, PA-C  Date: 09/30/2023 7:04 PM   Virtual Visit via Video Note   I, Angelia Kelp, connected with  Emily [name]  ([id number], May 15, 1980) on 09/30/23 at  6:45 PM EDT by a video-enabled telemedicine application and verified that I am speaking with the correct person using two identifiers.  Location: Patient: Virtual Visit  Location Patient: Home Provider: Virtual Visit Location Provider: Home Office   I discussed the limitations of evaluation and management by telemedicine and the availability of in person appointments. The patient expressed understanding and agreed to proceed.    History of Present Illness: Emily [name] is a 43 y.o. who identifies as a female who was assigned female at birth, and is being seen today for continued congestion. Seen yesterday by her PCP office and tested negative for Covid, Flu, and Strep. Advised OTC management. Today she reached back out to her PCP and Pulmonologist requesting a Z-pack for these symptoms. She had not heard back so she scheduled a Virtual Urgent Care visit.   She reports she is having continued sinus congestion still that has worsened and today the mucus became thick and discolored. Also having an increased cough with it.    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/2023   Bulimia nervosa 06/15/2023   Borderline personality disorder (HCC) 06/15/2023   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 06/15/2023   Chronic prescription benzodiazepine use 06/15/2023   History of alcohol use disorder in sustained remission 06/15/2023  Polypharmacy 06/15/2023   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   Caffeine-induced insomnia with snoring 06/15/2023   Irregular bleeding 06/02/2023   Pelvic cramping 06/02/2023   Urinary frequency 06/02/2023   Pregnancy examination or test, negative result 06/02/2023   Missed periods 06/02/2023   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    Sebaceous cyst of labia 02/03/2023   Cyst of left ovary 08/11/2022   ASCUS with positive high risk HPV cervical 08/11/2022   Moody 07/30/2022   Hot flashes 07/30/2022   Friable cervix 07/30/2022   Spotting 07/30/2022   Amenorrhea, secondary 07/30/2022   Bartholin cyst 04/30/2022   Irregular periods 04/30/2022   Dental caries 03/11/2022   Tick bite of back 12/20/2020   Chronic bronchitis (HCC) 07/09/2020   Breast tenderness 09/20/2018   Gastroesophageal reflux disease 09/16/2018   Shortness of breath 08/05/2018   Abnormal finding on lung imaging 08/05/2018   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   Swollen lymph nodes 01/06/2017   RUQ pain 10/08/2016   LLQ pain 10/08/2016   Menstrual period late 10/08/2016   Cyst of right Bartholin's gland 12/17/2015   Recurrent boils 06/18/2015   Type 2 diabetes mellitus with complication, without long-term current use of insulin  (HCC) 04/18/2015   Ingrowing toenail 04/18/2015   Vitamin D  deficiency 04/18/2015   Angular cheilitis 04/18/2015   Dyslipidemia 04/18/2015   Diabetes mellitus with complication in adult patient (HCC) 02/20/2015   Smoker 02/20/2015   Essential hypertension 02/20/2015   Pain in limb 02/20/2015   Myalgia 02/20/2015   Hypothyroidism 02/20/2015   Leg pain, inferior 02/20/2015   Toenail deformity 02/20/2015   Elevated liver enzymes 01/07/2012   Abnormal CT of liver 09/30/2011   Fatty liver 09/30/2011   G E REFLUX 11/30/2007   CHEST PAIN-UNSPECIFIED 11/30/2007   HEADACHE 10/21/2007   DYSPNEA 09/15/2007   UNSPECIFIED TACHYCARDIA 06/01/2007   COUGH 05/24/2007   TOBACCO ABUSE 05/20/2007   Pulmonary nodules 05/12/2007   DEPRESSION 04/19/2007   Asthma 04/19/2007    Allergies:  Allergies  Allergen Reactions   Bactrim  [Sulfamethoxazole -Trimethoprim ] Anaphylaxis and Other (See Comments)    Chest pain, trouble breathing   Ciprofloxacin Palpitations   Clindamycin /Lincomycin     Other reaction(s): Chest Pain   Flagyl   [Metronidazole ] Shortness Of Breath   Hydrocodone Other (See Comments)   Iodinated Contrast Media Swelling   Ondansetron Shortness Of Breath   Liraglutide  Nausea And Vomiting   Doxycycline  Nausea And Vomiting   Penicillins    Synjardy  Xr [Empagliflozin-Metformin  Hcl Er]    Medications:  Current Outpatient Medications:    azithromycin  (ZITHROMAX ) 250 MG tablet, Take 2 tablets on day 1, then 1 tablet daily on days 2 through 5, Disp: 6 tablet, Rfl: 0   promethazine-dextromethorphan (PROMETHAZINE-DM) 6.25-15 MG/5ML syrup, Take 5 mLs by mouth 4 (four) times daily as needed., Disp: 118 mL, Rfl: 0   ACCU-CHEK AVIVA PLUS test strip, 1 each by Other route 3 (three) times daily., Disp: , Rfl:    ACCU-CHEK FASTCLIX LANCETS MISC, USE AS DIRECTED, Disp: 102 each, Rfl: 3   albuterol  (VENTOLIN  HFA) 108 (90 Base) MCG/ACT inhaler, Inhale 2 puffs into the lungs every 6 (six) hours as needed for wheezing or shortness of breath., Disp: 18 g, Rfl: 3   amLODipine  (NORVASC ) 5 MG tablet, TAKE 1 TABLET (5 MG TOTAL) BY MOUTH DAILY., Disp: 90 tablet, Rfl: 3   benzonatate  (TESSALON ) 100 MG capsule, Take 1 capsule (100 mg total) by mouth 3 (three) times daily as needed for cough., Disp: 30 capsule, Rfl: 0  Blood Glucose Monitoring Suppl (ACCU-CHEK AVIVA PLUS) w/Device KIT, USE AS DIRECTED, Disp: 1 kit, Rfl: 0   Blood Glucose Monitoring Suppl DEVI, 1 each by Does not apply route in the morning, at noon, and at bedtime. May substitute to any manufacturer covered by patient's insurance., Disp: 1 each, Rfl: 0   clonazePAM (KLONOPIN) 1 MG tablet, Take 1 mg by mouth 2 (two) times daily., Disp: , Rfl:    Continuous Glucose Receiver (DEXCOM G7 RECEIVER) DEVI, 1 Device by Does not apply route continuous., Disp: 1 each, Rfl: 0   Continuous Glucose Sensor (DEXCOM G7 SENSOR) MISC, USE AS DIRECTED, Disp: 9 each, Rfl: 0   cyclobenzaprine  (FLEXERIL ) 10 MG tablet, Take 1 tablet (10 mg total) by mouth 3 (three) times daily as needed.,  Disp: 15 tablet, Rfl: 0   fluticasone  (FLONASE ) 50 MCG/ACT nasal spray, Place 2 sprays into both nostrils daily., Disp: 16 g, Rfl: 6   fluticasone -salmeterol (ADVAIR) 250-50 MCG/ACT AEPB, Inhale 1 puff into the lungs every 12 (twelve) hours. Rinse after use., Disp: 60 each, Rfl: 5   insulin  lispro (HUMALOG  KWIKPEN) 100 UNIT/ML KwikPen, Max daily 45 units, Disp: 30 mL, Rfl: 4   Insulin  Pen Needle 32G X 4 MM MISC, 1 Device by Does not apply route in the morning, at noon, in the evening, and at bedtime., Disp: 400 each, Rfl: 3   ipratropium (ATROVENT ) 0.03 % nasal spray, Place 2 sprays into both nostrils every 12 (twelve) hours., Disp: 30 mL, Rfl: 0   ipratropium-albuterol  (DUONEB) 0.5-2.5 (3) MG/3ML SOLN, Take 3 mLs by nebulization in the morning, at noon, and at bedtime., Disp: 270 mL, Rfl: 11   lamoTRIgine (LAMICTAL) 150 MG tablet, Take 150 mg by mouth daily., Disp: , Rfl:    levothyroxine  (SYNTHROID ) 75 MCG tablet, TAKE 1 TABLET BY MOUTH EVERY DAY BEFORE BREAKFAST, Disp: 90 tablet, Rfl: 0   megestrol  (MEGACE ) 40 MG tablet, 3 tablets a day for 5 days, 2 tablets a day for 5 days then 1 tablet daily, Disp: 45 tablet, Rfl: 3   metFORMIN  (GLUCOPHAGE -XR) 500 MG 24 hr tablet, Take 1 tablet (500 mg total) by mouth 2 (two) times daily with a meal., Disp: 60 tablet, Rfl: 5   montelukast  (SINGULAIR ) 10 MG tablet, TAKE 1 TABLET BY MOUTH EVERYDAY AT BEDTIME, Disp: 90 tablet, Rfl: 3   naproxen  (NAPROSYN ) 500 MG tablet, Take 1 tablet (500 mg total) by mouth 2 (two) times daily with a meal. (Patient taking differently: Take 500 mg by mouth daily as needed for moderate pain (pain score 4-6).), Disp: 30 tablet, Rfl: 0   Nebulizers (COMPRESSOR/NEBULIZER) MISC, Use as directed, Disp: 1 each, Rfl: 0   nitroGLYCERIN  (NITROSTAT ) 0.4 MG SL tablet, Place 1 tablet (0.4 mg total) under the tongue every 5 (five) minutes as needed for chest pain., Disp: 25 tablet, Rfl: 3   nystatin  cream (MYCOSTATIN ), Apply to affected area 2  times daily, Disp: 30 g, Rfl: 0   omeprazole  (PRILOSEC) 40 MG capsule, Take 40 mg by mouth every morning., Disp: , Rfl:    PARoxetine (PAXIL) 10 MG tablet, Take 10 mg by mouth at bedtime., Disp: , Rfl:    predniSONE  (DELTASONE ) 20 MG tablet, 60 mg x3d, 40 mg x3d, 20 mg x2d, 10 mg x2d, Disp: 18 tablet, Rfl: 0   promethazine (PHENERGAN) 12.5 MG tablet, Take 12.5 mg by mouth every 8 (eight) hours as needed for vomiting or nausea., Disp: , Rfl:    propranolol  (INDERAL ) 20 MG tablet,  Take 1 tablet (20 mg total) by mouth 3 (three) times daily., Disp: 270 tablet, Rfl: 3   pseudoephedrine  (SUDAFED) 120 MG 12 hr tablet, Take 1 tablet (120 mg total) by mouth 2 (two) times daily., Disp: 20 tablet, Rfl: 0   Respiratory Therapy Supplies (NEBULIZER/TUBING/MOUTHPIECE) KIT, Use as directed, Disp: 10 kit, Rfl: 11   rosuvastatin  (CRESTOR ) 5 MG tablet, Take 1 tablet (5 mg total) by mouth daily., Disp: 90 tablet, Rfl: 1   solifenacin  (VESICARE ) 5 MG tablet, Take 1 tablet (5 mg total) by mouth daily., Disp: 30 tablet, Rfl: 2   Vitamin D , Ergocalciferol , (DRISDOL ) 1.25 MG (50000 UNIT) CAPS capsule, Take 50,000 Units by mouth every 7 (seven) days., Disp: , Rfl:    ziprasidone (GEODON) 60 MG capsule, Take 60 mg by mouth at bedtime., Disp: , Rfl:   Observations/Objective: Patient is well-developed, well-nourished in no acute distress.  Resting comfortably at home.  Head is normocephalic, atraumatic.  No labored breathing.  Speech is clear and coherent with logical content.  Patient is alert and oriented at baseline.    Assessment and Plan: 1. Acute bacterial sinusitis (Primary) - azithromycin  (ZITHROMAX ) 250 MG tablet; Take 2 tablets on day 1, then 1 tablet daily on days 2 through 5  Dispense: 6 tablet; Refill: 0  2. Acute cough - promethazine-dextromethorphan (PROMETHAZINE-DM) 6.25-15 MG/5ML syrup; Take 5 mLs by mouth 4 (four) times daily as needed.  Dispense: 118 mL; Refill: 0  - Worsening over a week despite  OTC medications - Will treat with Z-pack and Promethazine DM - Can continue Mucinex (PLAIN) - Push fluids.  - Rest.  - Steam and humidifier can help - Seek in person evaluation if worsening or symptoms fail to improve     Follow Up Instructions: I discussed the assessment and treatment plan with the patient. The patient was provided an opportunity to ask questions and all were answered. The patient agreed with the plan and demonstrated an understanding of the instructions.  A copy of instructions were sent to the patient via MyChart unless otherwise noted below.    The patient was advised to call back or seek an in-person evaluation if the symptoms worsen or if the condition fails to improve as anticipated.    Angelia Kelp, PA-C

## 2023-09-30 NOTE — Patient Instructions (Signed)
 Emily [name], thank you for joining Angelia Kelp, PA-C for today's virtual visit.  While this provider is not your primary care provider (PCP), if your PCP is located in our provider database this encounter information will be shared with them immediately following your visit.   A [facility] MyChart account gives you access to today's visit and all your visits, tests, and labs performed at [facility]  click here if you don't have a [facility] MyChart account or go to [email or web address]  Consent: (Patient) Emily [name] provided verbal consent for this virtual visit at the beginning of the encounter.  Current Medications:  Current Outpatient Medications:    azithromycin  (ZITHROMAX ) 250 MG tablet, Take 2 tablets on day 1, then 1 tablet daily on days 2 through 5, Disp: 6 tablet, Rfl: 0   promethazine-dextromethorphan (PROMETHAZINE-DM) 6.25-15 MG/5ML syrup, Take 5 mLs by mouth 4 (four) times daily as needed., Disp: 118 mL, Rfl: 0   ACCU-CHEK AVIVA PLUS test strip, 1 each by Other route 3 (three) times daily., Disp: , Rfl:    ACCU-CHEK FASTCLIX LANCETS MISC, USE AS DIRECTED, Disp: 102 each, Rfl: 3   albuterol  (VENTOLIN  HFA) 108 (90 Base) MCG/ACT inhaler, Inhale 2 puffs into the lungs every 6 (six) hours as needed for wheezing or shortness of breath., Disp: 18 g, Rfl: 3   amLODipine  (NORVASC ) 5 MG tablet, TAKE 1 TABLET (5 MG TOTAL) BY MOUTH DAILY., Disp: 90 tablet, Rfl: 3   benzonatate  (TESSALON ) 100 MG capsule, Take 1 capsule (100 mg total) by mouth 3 (three) times daily as needed for cough., Disp: 30 capsule, Rfl: 0   Blood Glucose Monitoring Suppl (ACCU-CHEK AVIVA PLUS) w/Device KIT, USE AS DIRECTED, Disp: 1 kit, Rfl: 0   Blood Glucose Monitoring Suppl DEVI, 1 each by Does not apply route in the morning, at noon, and at bedtime. May substitute to any manufacturer covered by patient's insurance., Disp: 1 each, Rfl: 0   clonazePAM (KLONOPIN) 1 MG tablet, Take 1  mg by mouth 2 (two) times daily., Disp: , Rfl:    Continuous Glucose Receiver (DEXCOM G7 RECEIVER) DEVI, 1 Device by Does not apply route continuous., Disp: 1 each, Rfl: 0   Continuous Glucose Sensor (DEXCOM G7 SENSOR) MISC, USE AS DIRECTED, Disp: 9 each, Rfl: 0   cyclobenzaprine  (FLEXERIL ) 10 MG tablet, Take 1 tablet (10 mg total) by mouth 3 (three) times daily as needed., Disp: 15 tablet, Rfl: 0   fluticasone  (FLONASE ) 50 MCG/ACT nasal spray, Place 2 sprays into both nostrils daily., Disp: 16 g, Rfl: 6   fluticasone -salmeterol (ADVAIR) 250-50 MCG/ACT AEPB, Inhale 1 puff into the lungs every 12 (twelve) hours. Rinse after use., Disp: 60 each, Rfl: 5   insulin  lispro (HUMALOG  KWIKPEN) 100 UNIT/ML KwikPen, Max daily 45 units, Disp: 30 mL, Rfl: 4   Insulin  Pen Needle 32G X 4 MM MISC, 1 Device by Does not apply route in the morning, at noon, in the evening, and at bedtime., Disp: 400 each, Rfl: 3   ipratropium (ATROVENT ) 0.03 % nasal spray, Place 2 sprays into both nostrils every 12 (twelve) hours., Disp: 30 mL, Rfl: 0   ipratropium-albuterol  (DUONEB) 0.5-2.5 (3) MG/3ML SOLN, Take 3 mLs by nebulization in the morning, at noon, and at bedtime., Disp: 270 mL, Rfl: 11   lamoTRIgine (LAMICTAL) 150 MG tablet, Take 150 mg by mouth daily., Disp: , Rfl:    levothyroxine  (SYNTHROID ) 75 MCG tablet, TAKE 1 TABLET BY MOUTH EVERY DAY BEFORE BREAKFAST, Disp:  90 tablet, Rfl: 0   megestrol  (MEGACE ) 40 MG tablet, 3 tablets a day for 5 days, 2 tablets a day for 5 days then 1 tablet daily, Disp: 45 tablet, Rfl: 3   metFORMIN  (GLUCOPHAGE -XR) 500 MG 24 hr tablet, Take 1 tablet (500 mg total) by mouth 2 (two) times daily with a meal., Disp: 60 tablet, Rfl: 5   montelukast  (SINGULAIR ) 10 MG tablet, TAKE 1 TABLET BY MOUTH EVERYDAY AT BEDTIME, Disp: 90 tablet, Rfl: 3   naproxen  (NAPROSYN ) 500 MG tablet, Take 1 tablet (500 mg total) by mouth 2 (two) times daily with a meal. (Patient taking differently: Take 500 mg by mouth daily  as needed for moderate pain (pain score 4-6).), Disp: 30 tablet, Rfl: 0   Nebulizers (COMPRESSOR/NEBULIZER) MISC, Use as directed, Disp: 1 each, Rfl: 0   nitroGLYCERIN  (NITROSTAT ) 0.4 MG SL tablet, Place 1 tablet (0.4 mg total) under the tongue every 5 (five) minutes as needed for chest pain., Disp: 25 tablet, Rfl: 3   nystatin  cream (MYCOSTATIN ), Apply to affected area 2 times daily, Disp: 30 g, Rfl: 0   omeprazole  (PRILOSEC) 40 MG capsule, Take 40 mg by mouth every morning., Disp: , Rfl:    PARoxetine (PAXIL) 10 MG tablet, Take 10 mg by mouth at bedtime., Disp: , Rfl:    predniSONE  (DELTASONE ) 20 MG tablet, 60 mg x3d, 40 mg x3d, 20 mg x2d, 10 mg x2d, Disp: 18 tablet, Rfl: 0   promethazine (PHENERGAN) 12.5 MG tablet, Take 12.5 mg by mouth every 8 (eight) hours as needed for vomiting or nausea., Disp: , Rfl:    propranolol  (INDERAL ) 20 MG tablet, Take 1 tablet (20 mg total) by mouth 3 (three) times daily., Disp: 270 tablet, Rfl: 3   pseudoephedrine  (SUDAFED) 120 MG 12 hr tablet, Take 1 tablet (120 mg total) by mouth 2 (two) times daily., Disp: 20 tablet, Rfl: 0   Respiratory Therapy Supplies (NEBULIZER/TUBING/MOUTHPIECE) KIT, Use as directed, Disp: 10 kit, Rfl: 11   rosuvastatin  (CRESTOR ) 5 MG tablet, Take 1 tablet (5 mg total) by mouth daily., Disp: 90 tablet, Rfl: 1   solifenacin  (VESICARE ) 5 MG tablet, Take 1 tablet (5 mg total) by mouth daily., Disp: 30 tablet, Rfl: 2   Vitamin D , Ergocalciferol , (DRISDOL ) 1.25 MG (50000 UNIT) CAPS capsule, Take 50,000 Units by mouth every 7 (seven) days., Disp: , Rfl:    ziprasidone (GEODON) 60 MG capsule, Take 60 mg by mouth at bedtime., Disp: , Rfl:    Medications ordered in this encounter:  Meds ordered this encounter  Medications   azithromycin  (ZITHROMAX ) 250 MG tablet    Sig: Take 2 tablets on day 1, then 1 tablet daily on days 2 through 5    Dispense:  6 tablet    Refill:  0    Supervising Provider:   LAMPTEY, PHILIP O [[id number]]    promethazine-dextromethorphan (PROMETHAZINE-DM) 6.25-15 MG/5ML syrup    Sig: Take 5 mLs by mouth 4 (four) times daily as needed.    Dispense:  118 mL    Refill:  0    Supervising Provider:   Corine Dice [[id number]]     *If you need refills on other medications prior to your next appointment, please contact your pharmacy*  Follow-Up: Call back or seek an in-person evaluation if the symptoms worsen or if the condition fails to improve as anticipated.  [facility] [phone]  Other Instructions Upper Respiratory Infection, Adult An upper respiratory infection (URI) is a common  viral infection of the nose, throat, and upper air passages that lead to the lungs. The most common type of URI is the common cold. URIs usually get better on their own, without medical treatment. What are the causes? A URI is caused by a virus. You may catch a virus by: Breathing in droplets from an infected person's cough or sneeze. Touching something that has been exposed to the virus (is contaminated) and then touching your mouth, nose, or eyes. What increases the risk? You are more likely to get a URI if: You are very young or very old. You have close contact with others, such as at work, school, or a health care facility. You smoke. You have long-term (chronic) heart or lung disease. You have a weakened disease-fighting system (immune system). You have nasal allergies or asthma. You are experiencing a lot of stress. You have poor nutrition. What are the signs or symptoms? A URI usually involves some of the following symptoms: Runny or stuffy (congested) nose. Cough. Sneezing. Sore throat. Headache. Fatigue. Fever. Loss of appetite. Pain in your forehead, behind your eyes, and over your cheekbones (sinus pain). Muscle aches. Redness or irritation of the eyes. Pressure in the ears or face. How is this diagnosed? This condition may be diagnosed based on your medical history and  symptoms, and a physical exam. Your health care provider may use a swab to take a mucus sample from your nose (nasal swab). This sample can be tested to determine what virus is causing the illness. How is this treated? URIs usually get better on their own within 7-10 days. Medicines cannot cure URIs, but your health care provider may recommend certain medicines to help relieve symptoms, such as: Over-the-counter cold medicines. Cough suppressants. Coughing is a type of defense against infection that helps to clear the respiratory system, so take these medicines only as recommended by your health care provider. Fever-reducing medicines. Follow these instructions at home: Activity Rest as needed. If you have a fever, stay home from work or school until your fever is gone or until your health care provider says your URI cannot spread to other people (is no longer contagious). Your health care provider may have you wear a face mask to prevent your infection from spreading. Relieving symptoms Gargle with a mixture of salt and water 3-4 times a day or as needed. To make salt water, completely dissolve -1 tsp (3-6 g) of salt in 1 cup (237 mL) of warm water. Use a cool-mist humidifier to add moisture to the air. This can help you breathe more easily. Eating and drinking  Drink enough fluid to keep your urine pale yellow. Eat soups and other clear broths. General instructions  Take over-the-counter and prescription medicines only as told by your health care provider. These include cold medicines, fever reducers, and cough suppressants. Do not use any products that contain nicotine  or tobacco. These products include cigarettes, chewing tobacco, and vaping devices, such as e-cigarettes. If you need help quitting, ask your health care provider. Stay away from secondhand smoke. Stay up to date on all immunizations, including the yearly (annual) flu vaccine. Keep all follow-up visits. This is  important. How to prevent the spread of infection to others URIs can be contagious. To prevent the infection from spreading: Wash your hands with soap and water for at least 20 seconds. If soap and water are not available, use hand sanitizer. Avoid touching your mouth, face, eyes, or nose. Cough or sneeze into a tissue or  your sleeve or elbow instead of into your hand or into the air.  Contact a health care provider if: You are getting worse instead of better. You have a fever or chills. Your mucus is brown or red. You have yellow or brown discharge coming from your nose. You have pain in your face, especially when you bend forward. You have swollen neck glands. You have pain while swallowing. You have white areas in the back of your throat. Get help right away if: You have shortness of breath that gets worse. You have severe or persistent: Headache. Ear pain. Sinus pain. Chest pain. You have chronic lung disease along with any of the following: Making high-pitched whistling sounds when you breathe, most often when you breathe out (wheezing). Prolonged cough (more than 14 days). Coughing up blood. A change in your usual mucus. You have a stiff neck. You have changes in your: Vision. Hearing. Thinking. Mood. These symptoms may be an emergency. Get help right away. Call 911. Do not wait to see if the symptoms will go away. Do not drive yourself to the hospital. Summary An upper respiratory infection (URI) is a common infection of the nose, throat, and upper air passages that lead to the lungs. A URI is caused by a virus. URIs usually get better on their own within 7-10 days. Medicines cannot cure URIs, but your health care provider may recommend certain medicines to help relieve symptoms. This information is not intended to replace advice given to you by your health care provider. Make sure you discuss any questions you have with your health care provider. Document Revised:  11/07/2020 Document Reviewed: 11/07/2020 Elsevier Patient Education  2024 Elsevier Inc.   If you have been instructed to have an in-person evaluation today at a local Urgent Care facility, please use the link below. It will take you to a list of all of our available [facility], including address, phone number and hours of operation. Please do not delay care.  [facility]  If you or a family member do not have a primary care provider, use the link below to schedule a visit and establish care. When you choose a [facility] primary care physician or advanced practice provider, you gain a long-term partner in health. Find a Primary Care Provider  Learn more about [facility]'s in-office and virtual care options: [facility] - Get Care Now

## 2023-09-30 NOTE — Telephone Encounter (Signed)
**Note De-identified  [place] Obfuscation** Please advise 

## 2023-09-30 NOTE — Telephone Encounter (Signed)
 Noted, called and talked to pt. Pulmonology will prescribe a z-pack.

## 2023-09-30 NOTE — Telephone Encounter (Signed)
 Called patient.  Patient would like an antibiotic rx called in.  Patient prefers a Z-Pak and a cough syrup.   Dr. Dione Franks, please advise.

## 2023-09-30 NOTE — Telephone Encounter (Signed)
 Copied from CRM [id number]. Topic: General - Other >> Sep 30, 2023 11:47 AM Jenice Mitts wrote: Reason for CRM: Patient is calling because she said she feels worse today and would like to know if an antibiotic or specifically a zpack can be sent

## 2023-09-30 NOTE — Telephone Encounter (Signed)
 [name]?: Action required by provider  Patient is followed in Pulmonology for chronic bronchitis, last seen on 09/22/2023 by Maire Scot, MD. Called Nurse Triage reporting Nasal Congestion. Symptoms began several days ago. Interventions attempted:  Symptoms are: gradually worsening.  Triage Disposition: Home Care  Patient/caregiver understands and will follow disposition?: Yes            Copied from CRM [id number]. Topic: Clinical - Lab/Test Results >> Sep 30, 2023  1:27 PM Tianna S wrote: Reason for CRM: patient is calling to ask about her test results, she says this is her third day calling, called cal, was informed dr Bertrum Brodie was not in but he will be in tomorrow to go over those results, explained to patient, also patient is requesting a zpak because of her headcold and coughing up green and white sputum, please call and assist patient Reason for Disposition  [1] Sinus congestion as part of a cold AND [2] present < 10 days  Answer Assessment - Initial Assessment Questions Patient refuses appointment as she states she was at doctor yesterday. Patient requests an antibiotic prescription.   Head congestion Yellow and green snot when blow nose started two days ago Denies sinus pain, fever, cough  Protocols used: Sinus Pain or Congestion-A-AH

## 2023-10-01 ENCOUNTER — Telehealth: Payer: Self-pay | Admitting: *Deleted

## 2023-10-01 ENCOUNTER — Encounter: Payer: Self-pay | Admitting: Obstetrics & Gynecology

## 2023-10-01 ENCOUNTER — Encounter: Payer: Self-pay | Admitting: Family Medicine

## 2023-10-01 ENCOUNTER — Other Ambulatory Visit: Payer: Self-pay | Admitting: Adult Health

## 2023-10-01 ENCOUNTER — Telehealth: Payer: Self-pay

## 2023-10-01 DIAGNOSIS — R768 Other specified abnormal immunological findings in serum: Secondary | ICD-10-CM

## 2023-10-01 DIAGNOSIS — D8989 Other specified disorders involving the immune mechanism, not elsewhere classified: Secondary | ICD-10-CM

## 2023-10-01 DIAGNOSIS — U071 COVID-19: Secondary | ICD-10-CM | POA: Diagnosis not present

## 2023-10-01 NOTE — Telephone Encounter (Signed)
 PT calling again for BW results.

## 2023-10-01 NOTE — Telephone Encounter (Signed)
.  I repeated it because it was previously positive and wanted to see if that trend remained but I put that in the instructions at last visit.  In addition her ENA is positive at this time as well.  Therefore I felt like we need another rheumatology opinion and therefore I referred her outside.  I called her to explain this just now but it went to voicemail

## 2023-10-01 NOTE — Telephone Encounter (Signed)
 DUPLICATE

## 2023-10-01 NOTE — Telephone Encounter (Signed)
 For what  She can try   Z  PAK  OT Cough syrup  Allergies  Allergen Reactions   Bactrim  [Sulfamethoxazole -Trimethoprim ] Anaphylaxis and Other (See Comments)    Chest pain, trouble breathing   Ciprofloxacin Palpitations   Clindamycin /Lincomycin     Other reaction(s): Chest Pain   Flagyl  [Metronidazole ] Shortness Of Breath   Hydrocodone Other (See Comments)   Iodinated Contrast Media Swelling   Ondansetron Shortness Of Breath   Liraglutide  Nausea And Vomiting   Doxycycline  Nausea And Vomiting   Penicillins    Synjardy  Xr [Empagliflozin-Metformin  Hcl Er]

## 2023-10-01 NOTE — Telephone Encounter (Signed)
 Copied from CRM [id number]. Topic: Clinical - Medical Advice >> Oct 01, 2023  4:19 PM Aisha D wrote: Reason for CRM: Pt stated that she has a stuffy nose, coughing , and is nauseous. Pt stated that her mom tested positive for Covid and is going to get tested today as well. Pt stated that she has been taking cough syrup and a z pack and would like to know if there is anything else she should be doing.

## 2023-10-01 NOTE — Telephone Encounter (Signed)
 Zpak already sent

## 2023-10-01 NOTE — Telephone Encounter (Signed)
 Copied from CRM [id number]. Topic: Clinical - Lab/Test Results >> Sep 29, 2023 10:27 AM Corean Deutscher wrote: Reason for CRM: Patient called regarding her blood test results, patient states her result was positive and she would like to know what this means. >> Sep 29, 2023  3:48 PM Isabell A wrote: Patient states  she is still waiting for her results.  >> Sep 29, 2023 12:53 PM Ilene Malling wrote: Patient [phone] states called earlier today for lab results of antinuclear antibody and has not heard from the office. Please call back.   Patient would like her result's for her ANA  Dr.Ramaswamy can you please advise .  Thank you

## 2023-10-01 NOTE — Telephone Encounter (Signed)
 I called and spoke with the pt  She was made aware of lab results/recs per MR  She is asking how come we are doing this again  She states her ANA was pos and referred to rheum back in 2020  They stopped seeing her bc she says there was nothing they could do for her  She was last seen by Dr Rodell Citrin May 2025 MR- please advise, thanks!

## 2023-10-01 NOTE — Telephone Encounter (Signed)
   ANA and ENA antibiody positive There might be something autoimmune going on  Plan  - refer to rheumatology - order done  - let her know  - non-urgent     Latest Reference Range & Units 05/05/18 12:11 02/16/23 16:12 08/13/23 13:47 09/28/23 14:30  Anti Nuclear Antibody (ANA) Negative   POSITIVE !  Positive !  ANA Pattern 1   Nuclear, Nucleolar !    ANA Titer 1 titer  1:320 (H)    Angiotensin-Converting Enzyme 9 - 67 U/L 81 (H)     Anti JO-1 0.0 - 0.9 AI <0.2     Cyclic Citrullin Peptide Ab UNITS <16     ds DNA Ab IU/mL 1  <1   ENA RNP Ab 0.0 - 0.9 AI >8.0 (H)     Myeloperoxidase Abs AI <1.0     Serine Protease 3 AI <1.0     RA Latex Turbid. <14 IU/mL <14 <10    ENA SM Ab Ser-aCnc <1.0 NEG AI   <1.0 NEG   C3 Complement 83 - 193 mg/dL   469   C4 Complement 15 - 57 mg/dL   22   Ribonucleic Protein(ENA) Antibody, IgG <1.0 NEG AI   1.9 POS !   SSA (Ro) (ENA) Antibody, IgG <1.0 NEG AI <1.0 NEG  <1.0 NEG   SSB (La) (ENA) Antibody, IgG <1.0 NEG AI <1.0 NEG  <1.0 NEG   Scleroderma (Scl-70) (ENA) Antibody, IgG <1.0 NEG AI <1.0 NEG     !: Data is abnormal (H): Data is abnormally high

## 2023-10-02 ENCOUNTER — Encounter (HOSPITAL_COMMUNITY)
Admission: RE | Admit: 2023-10-02 | Discharge: 2023-10-02 | Disposition: A | Discharge status: Home / Self Care | Source: Ambulatory Visit | Attending: Obstetrics & Gynecology

## 2023-10-02 ENCOUNTER — Telehealth: Payer: Self-pay | Admitting: *Deleted

## 2023-10-02 MED ORDER — AZITHROMYCIN 250 MG PO TABS: 250.0000 mg | ORAL_TABLET | ORAL | 0 refills | Status: DC

## 2023-10-02 NOTE — Telephone Encounter (Signed)
 DUPLICATE

## 2023-10-02 NOTE — Telephone Encounter (Signed)
 Sent pt mychart message with response from MR

## 2023-10-05 NOTE — Telephone Encounter (Signed)
 MR- pt requesting more abx no nasal congestion related to covid  Please see mychart msgs

## 2023-10-05 NOTE — Telephone Encounter (Signed)
 Patient was seen at [facility] 09/20/23

## 2023-10-07 ENCOUNTER — Ambulatory Visit (HOSPITAL_COMMUNITY): Admission: RE | Admit: 2023-10-07 | Source: Home / Self Care | Admitting: Obstetrics & Gynecology

## 2023-10-07 ENCOUNTER — Encounter (HOSPITAL_COMMUNITY): Admission: RE | Payer: Self-pay | Source: Home / Self Care

## 2023-10-07 DIAGNOSIS — F410 Panic disorder [episodic paroxysmal anxiety] without agoraphobia: Secondary | ICD-10-CM | POA: Diagnosis not present

## 2023-10-07 DIAGNOSIS — F411 Generalized anxiety disorder: Secondary | ICD-10-CM | POA: Diagnosis not present

## 2023-10-07 DIAGNOSIS — F4312 Post-traumatic stress disorder, chronic: Secondary | ICD-10-CM | POA: Diagnosis not present

## 2023-10-07 DIAGNOSIS — F3170 Bipolar disorder, currently in remission, most recent episode unspecified: Secondary | ICD-10-CM | POA: Diagnosis not present

## 2023-10-07 SURGERY — ABLATION, ENDOMETRIUM, HYSTEROSCOPIC
Anesthesia: General

## 2023-10-07 NOTE — Telephone Encounter (Signed)
 Three is no indication for repeat antibiotics. She should seek evaluation in urgen care

## 2023-10-08 ENCOUNTER — Encounter: Payer: Self-pay | Admitting: Family Medicine

## 2023-10-12 ENCOUNTER — Encounter: Payer: Self-pay | Admitting: Family Medicine

## 2023-10-12 ENCOUNTER — Telehealth: Payer: Self-pay | Admitting: Radiology

## 2023-10-12 DIAGNOSIS — M25561 Pain in right knee: Secondary | ICD-10-CM

## 2023-10-12 DIAGNOSIS — M79604 Pain in right leg: Secondary | ICD-10-CM

## 2023-10-12 DIAGNOSIS — M545 Low back pain, unspecified: Secondary | ICD-10-CM

## 2023-10-12 NOTE — Telephone Encounter (Signed)
 Message from [facility] office below:  Please call concerning leg pain   [phone]

## 2023-10-12 NOTE — Telephone Encounter (Signed)
 Patient called stating she is having bilateral leg pain from the knees down x one week. She denies injury. Right = left pain. She continues to have pain in her left low back. She denies numbness and tingling, and states that she only has pain. She is taking tylenol and ibuprofen for pain with no relief. She was seen in the ED with xrays made of both tib/fibs.  She would like to know if you recommend something? She just does not understand why she is hurting this way.   CB [phone]  Patient uses [facility].

## 2023-10-14 NOTE — Addendum Note (Signed)
 Addended by: TRINDA DEANE HERO on: 10/14/2023 02:11 PM   Modules accepted: Orders

## 2023-10-14 NOTE — Telephone Encounter (Signed)
 I spoke with patient and advised. I have entered referral for PT in [place].

## 2023-10-14 NOTE — Telephone Encounter (Signed)
 I do not see anything wrong on the x-rays she had.  It could be referred pain from her knees or from the low back.  Her diabetes is too poorly controlled to consider any cortisone injection.  I think honestly physical therapy may be a good option for her and may help both from a joint pain standpoint and exercising may help with her diabetes control as well.  If she would like a referral for that, I can provide that or if she would prefer that I evaluate her, we can make her an appointment.

## 2023-10-16 ENCOUNTER — Encounter: Payer: Self-pay | Admitting: Family Medicine

## 2023-10-16 DIAGNOSIS — F411 Generalized anxiety disorder: Secondary | ICD-10-CM | POA: Diagnosis not present

## 2023-10-16 DIAGNOSIS — F410 Panic disorder [episodic paroxysmal anxiety] without agoraphobia: Secondary | ICD-10-CM | POA: Diagnosis not present

## 2023-10-16 DIAGNOSIS — F3170 Bipolar disorder, currently in remission, most recent episode unspecified: Secondary | ICD-10-CM | POA: Diagnosis not present

## 2023-10-16 DIAGNOSIS — F4312 Post-traumatic stress disorder, chronic: Secondary | ICD-10-CM | POA: Diagnosis not present

## 2023-10-16 DIAGNOSIS — Z5181 Encounter for therapeutic drug level monitoring: Secondary | ICD-10-CM | POA: Diagnosis not present

## 2023-10-20 DIAGNOSIS — K739 Chronic hepatitis, unspecified: Secondary | ICD-10-CM | POA: Diagnosis not present

## 2023-10-20 DIAGNOSIS — R7989 Other specified abnormal findings of blood chemistry: Secondary | ICD-10-CM | POA: Diagnosis not present

## 2023-10-21 ENCOUNTER — Ambulatory Visit: Attending: Surgical | Admitting: Physical Therapy

## 2023-10-22 DIAGNOSIS — E43 Unspecified severe protein-calorie malnutrition: Secondary | ICD-10-CM | POA: Diagnosis not present

## 2023-10-22 DIAGNOSIS — K859 Acute pancreatitis without necrosis or infection, unspecified: Secondary | ICD-10-CM | POA: Diagnosis not present

## 2023-10-22 DIAGNOSIS — K739 Chronic hepatitis, unspecified: Secondary | ICD-10-CM | POA: Diagnosis not present

## 2023-10-22 DIAGNOSIS — Z681 Body mass index (BMI) 19 or less, adult: Secondary | ICD-10-CM | POA: Diagnosis not present

## 2023-10-29 ENCOUNTER — Ambulatory Visit: Payer: Self-pay

## 2023-10-29 ENCOUNTER — Telehealth: Admitting: "Endocrinology

## 2023-10-29 ENCOUNTER — Telehealth: Admitting: Family Medicine

## 2023-10-29 DIAGNOSIS — J029 Acute pharyngitis, unspecified: Secondary | ICD-10-CM | POA: Diagnosis not present

## 2023-10-29 DIAGNOSIS — R051 Acute cough: Secondary | ICD-10-CM

## 2023-10-29 DIAGNOSIS — Z91199 Patient's noncompliance with other medical treatment and regimen due to unspecified reason: Secondary | ICD-10-CM

## 2023-10-29 NOTE — Progress Notes (Signed)
 Virtual Visit Consent   Emily [name], you are scheduled for a virtual visit with a [facility] provider today. Just as with appointments in the office, your consent must be obtained to participate. Your consent will be active for this visit and any virtual visit you may have with one of our providers in the next 365 days. If you have a MyChart account, a copy of this consent can be sent to you electronically.  As this is a virtual visit, video technology does not allow for your provider to perform a traditional examination. This may limit your provider's ability to fully assess your condition. If your provider identifies any concerns that need to be evaluated in person or the need to arrange testing (such as labs, EKG, etc.), we will make arrangements to do so. Although advances in technology are sophisticated, we cannot ensure that it will always work on either your end or our end. If the connection with a video visit is poor, the visit may have to be switched to a telephone visit. With either a video or telephone visit, we are not always able to ensure that we have a secure connection.  By engaging in this virtual visit, you consent to the provision of healthcare and authorize for your insurance to be billed (if applicable) for the services provided during this visit. Depending on your insurance coverage, you may receive a charge related to this service.  I need to obtain your verbal consent now. Are you willing to proceed with your visit today? Emily [name] has provided verbal consent on 10/29/2023 for a virtual visit (video or telephone). [name], NP  Date: 10/29/2023 12:19 PM   Virtual Visit via Video Note   I, [name], connected with  Emily [name]  ([id number], 14-Apr-1981) on 10/29/23 at 12:15 PM EDT by a video-enabled telemedicine application and verified that I am speaking with the correct person using two identifiers.  Location: Patient: Virtual Visit Location  Patient: Home Provider: Virtual Visit Location Provider: Home Office   I discussed the limitations of evaluation and management by telemedicine and the availability of in person appointments. The patient expressed understanding and agreed to proceed.    History of Present Illness: Emily [name] is a 43 y.o. who identifies as a female who was assigned female at birth, and is being seen today for sore throat  Onset was last night with cough and sore throat started this morning red and sore with swallowing Some mucus that is green, cough- all night,  Denies chest pain, shortness of breath, fevers, chills, ear pain or headaches.  Uses Singular nightly   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/2023   Bulimia nervosa 06/15/2023   Borderline personality disorder (HCC) 06/15/2023   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 06/15/2023   Chronic prescription benzodiazepine use 06/15/2023   History of alcohol use disorder in sustained remission 06/15/2023   Polypharmacy 06/15/2023   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   Caffeine-induced insomnia with snoring 06/15/2023   Irregular bleeding 06/02/2023   Pelvic cramping 06/02/2023   Urinary frequency 06/02/2023   Pregnancy examination or  test, negative result 06/02/2023   Missed periods 06/02/2023   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   Sebaceous cyst of labia 02/03/2023   Cyst of left ovary 08/11/2022   ASCUS with positive high risk HPV cervical 08/11/2022   M[place]y 07/30/2022   Hot flashes 07/30/2022   Friable cervix 07/30/2022    Spotting 07/30/2022   Amenorrhea, secondary 07/30/2022   Bartholin cyst 04/30/2022   Irregular periods 04/30/2022   Dental caries 03/11/2022   Tick bite of back 12/20/2020   Chronic bronchitis (HCC) 07/09/2020   Breast tenderness 09/20/2018   Gastroesophageal reflux disease 09/16/2018   Shortness of breath 08/05/2018   Abnormal finding on lung imaging 08/05/2018   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   Swollen lymph nodes 01/06/2017   RUQ pain 10/08/2016   LLQ pain 10/08/2016   Menstrual period late 10/08/2016   Cyst of right Bartholin's gland 12/17/2015   Recurrent boils 06/18/2015   Type 2 diabetes mellitus with complication, without long-term current use of insulin  (HCC) 04/18/2015   Ingrowing toenail 04/18/2015   Vitamin D  deficiency 04/18/2015   Angular cheilitis 04/18/2015   Dyslipidemia 04/18/2015   Diabetes mellitus with complication in adult patient (HCC) 02/20/2015   Smoker 02/20/2015   Essential hypertension 02/20/2015   Pain in limb 02/20/2015   Myalgia 02/20/2015   Hypothyroidism 02/20/2015   Leg pain, inferior 02/20/2015   Toenail deformity 02/20/2015   Elevated liver enzymes 01/07/2012   Abnormal CT of liver 09/30/2011   Fatty liver 09/30/2011   G E REFLUX 11/30/2007   CHEST PAIN-UNSPECIFIED 11/30/2007   HEADACHE 10/21/2007   DYSPNEA 09/15/2007   UNSPECIFIED TACHYCARDIA 06/01/2007   COUGH 05/24/2007   TOBACCO ABUSE 05/20/2007   Pulmonary nodules 05/12/2007   DEPRESSION 04/19/2007   Asthma 04/19/2007    Allergies:  Allergies  Allergen Reactions   Bactrim  [Sulfamethoxazole -Trimethoprim ] Anaphylaxis and Other (See Comments)    Chest pain, trouble breathing   Ciprofloxacin Palpitations   Clindamycin /Lincomycin     Other reaction(s): Chest Pain   Flagyl  [Metronidazole ] Shortness Of Breath   Hydrocodone Other (See Comments)   Iodinated Contrast Media Swelling   Ondansetron Shortness Of Breath   Liraglutide  Nausea And Vomiting   Doxycycline  Nausea And  Vomiting   Penicillins    Synjardy  Xr [Empagliflozin-Metformin  Hcl Er]    Medications:  Current Outpatient Medications:    ACCU-CHEK AVIVA PLUS test strip, 1 each by Other route 3 (three) times daily., Disp: , Rfl:    ACCU-CHEK FASTCLIX LANCETS MISC, USE AS DIRECTED, Disp: 102 each, Rfl: 3   albuterol  (VENTOLIN  HFA) 108 (90 Base) MCG/ACT inhaler, Inhale 2 puffs into the lungs every 6 (six) hours as needed for wheezing or shortness of breath., Disp: 18 g, Rfl: 3   amLODipine  (NORVASC ) 5 MG tablet, TAKE 1 TABLET (5 MG TOTAL) BY MOUTH DAILY., Disp: 90 tablet, Rfl: 3   azithromycin  (ZITHROMAX ) 250 MG tablet, Take 1 tablet (250 mg total) by mouth as directed., Disp: 6 tablet, Rfl: 0   benzonatate  (TESSALON ) 100 MG capsule, Take 1 capsule (100 mg total) by mouth 3 (three) times daily as needed for cough., Disp: 30 capsule, Rfl: 0   Blood Glucose Monitoring Suppl (ACCU-CHEK AVIVA PLUS) w/Device KIT, USE AS DIRECTED, Disp: 1 kit, Rfl: 0   Blood Glucose Monitoring Suppl DEVI, 1 each by Does not apply route in the morning, at noon, and at bedtime. May substitute to any manufacturer covered by patient's insurance., Disp: 1 each, Rfl: 0   clonazePAM (KLONOPIN) 1 MG tablet, Take 1  mg by mouth 2 (two) times daily., Disp: , Rfl:    Continuous Glucose Receiver (DEXCOM G7 RECEIVER) DEVI, 1 Device by Does not apply route continuous., Disp: 1 each, Rfl: 0   Continuous Glucose Sensor (DEXCOM G7 SENSOR) MISC, USE AS DIRECTED, Disp: 9 each, Rfl: 0   cyclobenzaprine  (FLEXERIL ) 10 MG tablet, Take 1 tablet (10 mg total) by mouth 3 (three) times daily as needed., Disp: 15 tablet, Rfl: 0   fluticasone  (FLONASE ) 50 MCG/ACT nasal spray, Place 2 sprays into both nostrils daily., Disp: 16 g, Rfl: 6   fluticasone -salmeterol (ADVAIR) 250-50 MCG/ACT AEPB, Inhale 1 puff into the lungs every 12 (twelve) hours. Rinse after use., Disp: 60 each, Rfl: 5   insulin  lispro (HUMALOG  KWIKPEN) 100 UNIT/ML KwikPen, Max daily 45 units, Disp:  30 mL, Rfl: 4   Insulin  Pen Needle 32G X 4 MM MISC, 1 Device by Does not apply route in the morning, at noon, in the evening, and at bedtime., Disp: 400 each, Rfl: 3   ipratropium (ATROVENT ) 0.03 % nasal spray, Place 2 sprays into both nostrils every 12 (twelve) hours., Disp: 30 mL, Rfl: 0   ipratropium-albuterol  (DUONEB) 0.5-2.5 (3) MG/3ML SOLN, Take 3 mLs by nebulization in the morning, at noon, and at bedtime., Disp: 270 mL, Rfl: 11   lamoTRIgine (LAMICTAL) 150 MG tablet, Take 150 mg by mouth daily., Disp: , Rfl:    levothyroxine  (SYNTHROID ) 75 MCG tablet, TAKE 1 TABLET BY MOUTH EVERY DAY BEFORE BREAKFAST, Disp: 90 tablet, Rfl: 0   megestrol  (MEGACE ) 40 MG tablet, 3 tablets a day for 5 days, 2 tablets a day for 5 days then 1 tablet daily, Disp: 45 tablet, Rfl: 3   metFORMIN  (GLUCOPHAGE -XR) 500 MG 24 hr tablet, Take 1 tablet (500 mg total) by mouth 2 (two) times daily with a meal., Disp: 60 tablet, Rfl: 5   montelukast  (SINGULAIR ) 10 MG tablet, TAKE 1 TABLET BY MOUTH EVERYDAY AT BEDTIME, Disp: 90 tablet, Rfl: 3   naproxen  (NAPROSYN ) 500 MG tablet, Take 1 tablet (500 mg total) by mouth 2 (two) times daily with a meal. (Patient taking differently: Take 500 mg by mouth daily as needed for moderate pain (pain score 4-6).), Disp: 30 tablet, Rfl: 0   Nebulizers (COMPRESSOR/NEBULIZER) MISC, Use as directed, Disp: 1 each, Rfl: 0   nitroGLYCERIN  (NITROSTAT ) 0.4 MG SL tablet, Place 1 tablet (0.4 mg total) under the tongue every 5 (five) minutes as needed for chest pain., Disp: 25 tablet, Rfl: 3   nystatin  cream (MYCOSTATIN ), Apply to affected area 2 times daily, Disp: 30 g, Rfl: 0   omeprazole  (PRILOSEC) 40 MG capsule, Take 40 mg by mouth every morning., Disp: , Rfl:    PARoxetine (PAXIL) 10 MG tablet, Take 10 mg by mouth at bedtime., Disp: , Rfl:    predniSONE  (DELTASONE ) 20 MG tablet, 60 mg x3d, 40 mg x3d, 20 mg x2d, 10 mg x2d, Disp: 18 tablet, Rfl: 0   promethazine  (PHENERGAN ) 12.5 MG tablet, Take 12.5 mg  by mouth every 8 (eight) hours as needed for vomiting or nausea., Disp: , Rfl:    promethazine -dextromethorphan (PROMETHAZINE -DM) 6.25-15 MG/5ML syrup, Take 5 mLs by mouth 4 (four) times daily as needed., Disp: 118 mL, Rfl: 0   propranolol  (INDERAL ) 20 MG tablet, Take 1 tablet (20 mg total) by mouth 3 (three) times daily., Disp: 270 tablet, Rfl: 3   pseudoephedrine  (SUDAFED) 120 MG 12 hr tablet, Take 1 tablet (120 mg total) by mouth 2 (two) times daily., Disp:  20 tablet, Rfl: 0   Respiratory Therapy Supplies (NEBULIZER/TUBING/MOUTHPIECE) KIT, Use as directed, Disp: 10 kit, Rfl: 11   rosuvastatin  (CRESTOR ) 5 MG tablet, Take 1 tablet (5 mg total) by mouth daily., Disp: 90 tablet, Rfl: 1   solifenacin  (VESICARE ) 5 MG tablet, Take 1 tablet (5 mg total) by mouth daily., Disp: 30 tablet, Rfl: 2   Vitamin D , Ergocalciferol , (DRISDOL ) 1.25 MG (50000 UNIT) CAPS capsule, Take 50,000 Units by mouth every 7 (seven) days., Disp: , Rfl:    ziprasidone (GEODON) 60 MG capsule, Take 60 mg by mouth at bedtime., Disp: , Rfl:   Observations/Objective: Patient is well-developed, well-nourished in no acute distress.  Resting comfortably  at home.  Head is normocephalic, atraumatic.  No labored breathing.  Speech is clear and coherent with logical content.  Patient is alert and oriented at baseline.    Assessment and Plan:  1. Acute cough (Primary)  -given recent treatment with zpack and promethazine  DM last month, will refrain from treatment of those, given the duration of symptoms. -advised in person for swabbing if desired for POC treatment based on swabs -additionally HTN so bromfed not an option -OTC safe options reviewed  -rest -fluids -humidifier -follow up in person if not improving  Reviewed side effects, risks and benefits of medication.    Patient acknowledged agreement and understanding of the plan.   Past Medical, Surgical, Social History, Allergies, and Medications have been  Reviewed.    Follow Up Instructions: I discussed the assessment and treatment plan with the patient. The patient was provided an opportunity to ask questions and all were answered. The patient agreed with the plan and demonstrated an understanding of the instructions.  A copy of instructions were sent to the patient via MyChart unless otherwise noted below.    The patient was advised to call back or seek an in-person evaluation if the symptoms worsen or if the condition fails to improve as anticipated.    [name], NP

## 2023-10-29 NOTE — Patient Instructions (Addendum)
 Emily [name], thank you for joining Emily CHRISTELLA Barefoot, NP for today's virtual visit.  While this provider is not your primary care provider (PCP), if your PCP is located in our provider database this encounter information will be shared with them immediately following your visit.   A [facility] MyChart account gives you access to today's visit and all your visits, tests, and labs performed at [facility]  click here if you don't have a [facility] MyChart account or go to [email or web address]  Consent: (Patient) Emily [name] provided verbal consent for this virtual visit at the beginning of the encounter.  Current Medications:  Current Outpatient Medications:    ACCU-CHEK AVIVA PLUS test strip, 1 each by Other route 3 (three) times daily., Disp: , Rfl:    ACCU-CHEK FASTCLIX LANCETS MISC, USE AS DIRECTED, Disp: 102 each, Rfl: 3   albuterol  (VENTOLIN  HFA) 108 (90 Base) MCG/ACT inhaler, Inhale 2 puffs into the lungs every 6 (six) hours as needed for wheezing or shortness of breath., Disp: 18 g, Rfl: 3   amLODipine  (NORVASC ) 5 MG tablet, TAKE 1 TABLET (5 MG TOTAL) BY MOUTH DAILY., Disp: 90 tablet, Rfl: 3   azithromycin  (ZITHROMAX ) 250 MG tablet, Take 1 tablet (250 mg total) by mouth as directed., Disp: 6 tablet, Rfl: 0   benzonatate  (TESSALON ) 100 MG capsule, Take 1 capsule (100 mg total) by mouth 3 (three) times daily as needed for cough., Disp: 30 capsule, Rfl: 0   Blood Glucose Monitoring Suppl (ACCU-CHEK AVIVA PLUS) w/Device KIT, USE AS DIRECTED, Disp: 1 kit, Rfl: 0   Blood Glucose Monitoring Suppl DEVI, 1 each by Does not apply route in the morning, at noon, and at bedtime. May substitute to any manufacturer covered by patient's insurance., Disp: 1 each, Rfl: 0   clonazePAM (KLONOPIN) 1 MG tablet, Take 1 mg by mouth 2 (two) times daily., Disp: , Rfl:    Continuous Glucose Receiver (DEXCOM G7 RECEIVER) DEVI, 1 Device by Does not apply route continuous., Disp: 1 each,  Rfl: 0   Continuous Glucose Sensor (DEXCOM G7 SENSOR) MISC, USE AS DIRECTED, Disp: 9 each, Rfl: 0   cyclobenzaprine  (FLEXERIL ) 10 MG tablet, Take 1 tablet (10 mg total) by mouth 3 (three) times daily as needed., Disp: 15 tablet, Rfl: 0   fluticasone  (FLONASE ) 50 MCG/ACT nasal spray, Place 2 sprays into both nostrils daily., Disp: 16 g, Rfl: 6   fluticasone -salmeterol (ADVAIR) 250-50 MCG/ACT AEPB, Inhale 1 puff into the lungs every 12 (twelve) hours. Rinse after use., Disp: 60 each, Rfl: 5   insulin  lispro (HUMALOG  KWIKPEN) 100 UNIT/ML KwikPen, Max daily 45 units, Disp: 30 mL, Rfl: 4   Insulin  Pen Needle 32G X 4 MM MISC, 1 Device by Does not apply route in the morning, at noon, in the evening, and at bedtime., Disp: 400 each, Rfl: 3   ipratropium (ATROVENT ) 0.03 % nasal spray, Place 2 sprays into both nostrils every 12 (twelve) hours., Disp: 30 mL, Rfl: 0   ipratropium-albuterol  (DUONEB) 0.5-2.5 (3) MG/3ML SOLN, Take 3 mLs by nebulization in the morning, at noon, and at bedtime., Disp: 270 mL, Rfl: 11   lamoTRIgine (LAMICTAL) 150 MG tablet, Take 150 mg by mouth daily., Disp: , Rfl:    levothyroxine  (SYNTHROID ) 75 MCG tablet, TAKE 1 TABLET BY MOUTH EVERY DAY BEFORE BREAKFAST, Disp: 90 tablet, Rfl: 0   megestrol  (MEGACE ) 40 MG tablet, 3 tablets a day for 5 days, 2 tablets a day for 5 days then 1 tablet  daily, Disp: 45 tablet, Rfl: 3   metFORMIN  (GLUCOPHAGE -XR) 500 MG 24 hr tablet, Take 1 tablet (500 mg total) by mouth 2 (two) times daily with a meal., Disp: 60 tablet, Rfl: 5   montelukast  (SINGULAIR ) 10 MG tablet, TAKE 1 TABLET BY MOUTH EVERYDAY AT BEDTIME, Disp: 90 tablet, Rfl: 3   naproxen  (NAPROSYN ) 500 MG tablet, Take 1 tablet (500 mg total) by mouth 2 (two) times daily with a meal. (Patient taking differently: Take 500 mg by mouth daily as needed for moderate pain (pain score 4-6).), Disp: 30 tablet, Rfl: 0   Nebulizers (COMPRESSOR/NEBULIZER) MISC, Use as directed, Disp: 1 each, Rfl: 0    nitroGLYCERIN  (NITROSTAT ) 0.4 MG SL tablet, Place 1 tablet (0.4 mg total) under the tongue every 5 (five) minutes as needed for chest pain., Disp: 25 tablet, Rfl: 3   nystatin  cream (MYCOSTATIN ), Apply to affected area 2 times daily, Disp: 30 g, Rfl: 0   omeprazole  (PRILOSEC) 40 MG capsule, Take 40 mg by mouth every morning., Disp: , Rfl:    PARoxetine (PAXIL) 10 MG tablet, Take 10 mg by mouth at bedtime., Disp: , Rfl:    predniSONE  (DELTASONE ) 20 MG tablet, 60 mg x3d, 40 mg x3d, 20 mg x2d, 10 mg x2d, Disp: 18 tablet, Rfl: 0   promethazine  (PHENERGAN ) 12.5 MG tablet, Take 12.5 mg by mouth every 8 (eight) hours as needed for vomiting or nausea., Disp: , Rfl:    promethazine -dextromethorphan (PROMETHAZINE -DM) 6.25-15 MG/5ML syrup, Take 5 mLs by mouth 4 (four) times daily as needed., Disp: 118 mL, Rfl: 0   propranolol  (INDERAL ) 20 MG tablet, Take 1 tablet (20 mg total) by mouth 3 (three) times daily., Disp: 270 tablet, Rfl: 3   pseudoephedrine  (SUDAFED) 120 MG 12 hr tablet, Take 1 tablet (120 mg total) by mouth 2 (two) times daily., Disp: 20 tablet, Rfl: 0   Respiratory Therapy Supplies (NEBULIZER/TUBING/MOUTHPIECE) KIT, Use as directed, Disp: 10 kit, Rfl: 11   rosuvastatin  (CRESTOR ) 5 MG tablet, Take 1 tablet (5 mg total) by mouth daily., Disp: 90 tablet, Rfl: 1   solifenacin  (VESICARE ) 5 MG tablet, Take 1 tablet (5 mg total) by mouth daily., Disp: 30 tablet, Rfl: 2   Vitamin D , Ergocalciferol , (DRISDOL ) 1.25 MG (50000 UNIT) CAPS capsule, Take 50,000 Units by mouth every 7 (seven) days., Disp: , Rfl:    ziprasidone (GEODON) 60 MG capsule, Take 60 mg by mouth at bedtime., Disp: , Rfl:    Medications ordered in this encounter:  No orders of the defined types were placed in this encounter.    *If you need refills on other medications prior to your next appointment, please contact your pharmacy*  Follow-Up: Call back or seek an in-person evaluation if the symptoms worsen or if the condition fails to  improve as anticipated.  [facility] [phone]  Other Instructions   -rest -fluids -humidifier -follow up in person if not improving   If you have been instructed to have an in-person evaluation today at a local Urgent Care facility, please use the link below. It will take you to a list of all of our available [facility], including address, phone number and hours of operation. Please do not delay care.  [facility]  If you or a family member do not have a primary care provider, use the link below to schedule a visit and establish care. When you choose a [facility] primary care physician or advanced practice provider, you  gain a long-term partner in health. Find a Primary Care Provider  Learn more about [facility]'s in-office and virtual care options: [facility] Get Care Now

## 2023-10-29 NOTE — Progress Notes (Signed)
 The patient no-showed for appointment despite this provider sending direct link, reaching out via phone with no response and waiting for at least 10 minutes from appointment time for patient to join. They will be marked as a NS for this appointment/time.   Freddy Finner, NP

## 2023-10-29 NOTE — Telephone Encounter (Signed)
 [name]?: FYI only for provider.  Patient was last seen in primary care on 10/29/2023 by Moishe Chiquita HERO, NP.  Called Nurse Triage reporting Sore Throat.  Symptoms began 2 days ago.  Symptoms are: gradually worsening.  Triage Disposition: Home Care  Patient/caregiver understands and will follow disposition?: Patient already has telehealth appointment today.     Copied from CRM [id number]. Topic: Clinical - Red Word Triage >> Oct 29, 2023 12:28 PM Larissa S wrote: [facility] that prompted transfer to Nurse Triage: sore throat with swelling, cough with green mucous    Reason for Disposition  [1] Sore throat with cough/cold symptoms AND [2] present < 5 days  Answer Assessment - Initial Assessment Questions 1. ONSET: When did the throat start hurting? (Hours or days ago)      2 days ago  2. SEVERITY: How bad is the sore throat? (Scale 1-10; mild, moderate or severe)     Moderate  3. STREP EXPOSURE: Has there been any exposure to strep within the past week? If Yes, ask: What type of contact occurred?      No 4.  VIRAL SYMPTOMS: Are there any symptoms of a cold, such as a runny nose, cough, hoarse voice or red eyes?      Cough, runny nose  5. FEVER: Do you have a fever? If Yes, ask: What is your temperature, how was it measured, and when did it start?     No 6. PUS ON THE TONSILS: Is there pus on the tonsils in the back of your throat?     No 7. OTHER SYMPTOMS: Do you have any other symptoms? (e.g., difficulty breathing, headache, rash)     No 8. PREGNANCY: Is there any chance you are pregnant? When was your last menstrual period?     No  Protocols used: Sore Throat-A-AH

## 2023-10-30 ENCOUNTER — Encounter: Payer: Self-pay | Admitting: Cardiology

## 2023-10-30 ENCOUNTER — Encounter: Payer: Self-pay | Admitting: Internal Medicine

## 2023-10-30 NOTE — Telephone Encounter (Signed)
 Spoke to patient, appointment schedule for 11/12/23.patient verbalized understanding.

## 2023-11-02 ENCOUNTER — Ambulatory Visit: Admitting: Adult Health

## 2023-11-03 ENCOUNTER — Encounter: Payer: Self-pay | Admitting: Internal Medicine

## 2023-11-03 ENCOUNTER — Ambulatory Visit (HOSPITAL_COMMUNITY)
Admission: RE | Admit: 2023-11-03 | Discharge: 2023-11-03 | Disposition: A | Discharge status: Home / Self Care | Source: Ambulatory Visit | Attending: Primary Care | Admitting: Primary Care

## 2023-11-03 ENCOUNTER — Telehealth: Payer: Self-pay | Admitting: Primary Care

## 2023-11-03 DIAGNOSIS — J45909 Unspecified asthma, uncomplicated: Secondary | ICD-10-CM

## 2023-11-03 DIAGNOSIS — R0789 Other chest pain: Secondary | ICD-10-CM

## 2023-11-03 DIAGNOSIS — R0781 Pleurodynia: Secondary | ICD-10-CM | POA: Diagnosis not present

## 2023-11-03 NOTE — Telephone Encounter (Signed)
 Please see open Patient Messages . PT can not come in until Thurs for an acute appt but states she would like us  to put in an order for her at [facility] for an Xray. She has transportation issues (that is why I could not double book her w/Ms. Hope for tomorrow as offered.) so that is why she wants the order put in at [facility].Please ask Ms. Hope if this is possible for the PT and call to advise her. TY.

## 2023-11-03 NOTE — Telephone Encounter (Signed)
 Sure that is fine re: pleuritic chest pain/asthma

## 2023-11-03 NOTE — Telephone Encounter (Signed)
 Appointment scheduled for 7/17 NFN

## 2023-11-03 NOTE — Addendum Note (Signed)
 Addended by: MELVENIA WILFORD SAUNDERS on: 11/03/2023 04:53 PM   Modules accepted: Orders

## 2023-11-03 NOTE — Telephone Encounter (Signed)
 Please schedule her with APP for an acute visit,Katie has an opening tomorrow at 3:30,also joan gilis might have some openings to.I have already attempted to call and sent a mychart msg

## 2023-11-03 NOTE — Telephone Encounter (Signed)
 Please get her an apt with APP today or tomorrow to assess her symptoms better in person, Candis may have some openings otherwise you can double book me

## 2023-11-03 NOTE — Telephone Encounter (Signed)
 [name], okay for xray?

## 2023-11-03 NOTE — Telephone Encounter (Signed)
 Order placed.NFN

## 2023-11-04 ENCOUNTER — Ambulatory Visit: Payer: Self-pay | Admitting: Primary Care

## 2023-11-04 ENCOUNTER — Telehealth: Payer: Self-pay

## 2023-11-04 NOTE — Progress Notes (Signed)
 CXR was normal, needs OV in person with MD or APP to address

## 2023-11-04 NOTE — Telephone Encounter (Signed)
 Copied from CRM [id number]. Topic: Clinical - Request for Lab/Test Order >> Nov 03, 2023  4:22 PM Emily [name] wrote: Reason for CRM: Patient calling to request that the xray be placed today. Informed patient that provider has Ok'ed the x-ray and the order just needs to be placed at this time. Patient requesting ETA.  This was completed yesterday. Pt is scheduled to see Dr Kara on 11-05-23. Completing this as we do not need multiple encounters.

## 2023-11-05 ENCOUNTER — Ambulatory Visit: Admitting: Pulmonary Disease

## 2023-11-06 ENCOUNTER — Telehealth: Payer: Self-pay

## 2023-11-06 NOTE — Telephone Encounter (Signed)
 Called PT in response to message asking for a apt. No answer Left VM.

## 2023-11-10 DIAGNOSIS — K8020 Calculus of gallbladder without cholecystitis without obstruction: Secondary | ICD-10-CM | POA: Diagnosis not present

## 2023-11-10 DIAGNOSIS — R1084 Generalized abdominal pain: Secondary | ICD-10-CM | POA: Diagnosis not present

## 2023-11-10 NOTE — Progress Notes (Signed)
 Patient ID: Emily [name] is a 43 y.o. (DOB April 30, 1980) female  Primary Care Provider : Philippe JONELLE [name] Primary Gastroenterologist: Emily Sale, MD    Assessment:  1. Generalized abdominal pain   2. History of liver biopsy   3. Gallstones   4. Alcoholic cirrhosis of liver without ascites (*)   5. Mucopurulent chronic bronchitis (*)   6. Cigarette nicotine  dependence with nicotine -induced disorder   7. Asthma, unspecified asthma severity, unspecified whether complicated, unspecified whether persistent (*)   8. Abnormal finding on lung imaging   9. Mild intermittent asthma without complication (*)   10. Tobacco abuse   11. Polypharmacy   12. Bipolar 1 disorder (*)      Discussion/Plan: I believe Emily [name]'s abdominal pain is multifactorial.  She has elements of her pain that are aggravated with movement suggesting a musculoskeletal etiology but she is also describing postprandial aggravation of right upper quadrant pain with associated nausea, vomiting and weight loss (5# unintentional wt loss since 07/2023).  She was supposed to follow-up with general surgery 07/2023 and tells me she did but they were hesitant to perform surgery given her pulmonary history and ongoing tobacco abuse.  She tells me they also felt her pain was coming from her liver.  I cannot see any general surgery notes in her chart.  It is possible she also has an element of visceral hypersensitivity contributing to her abdominal pain.  She has had an extensive GI workup to date.  Recent EGD and sigmoidoscopy 08/2023 to the splenic flexure was essentially normal.  An MRCP 07/2023 showed a cirrhosis without focal liver lesions and a gallstone but no evidence of acute cholecystitis.  GES showed rapid gastric emptying but she was possibly on Reglan at the time when the study was done.  She did have a ultrasound-guided liver biopsy this month (steatohepatitis with bridging fibrosis) but did not have any  postprocedure complications.  Physical exam today did not show any evidence of procedure site erythema or cellulitis.  She has been without fever and had no tenderness at biopsy site but she feels pain has been significantly worse since her bx. Plan below:   Repeat CTAP (without contrast 2/2 allergy) given worsening abdominal pain since liver bx.  If CT negative for acute findings, suspect she may have a MSK component to some of her pain given aggravation with position change and would recommend superficial heating pad for this. Amb referral placed for smoking cessation Continue phenergan  25 q6hrs prn for nausea Commended efforts towards A1C improvement  Commended and encouraged continued sobriety efforts (has been sober for almost 2 years). Patient declined rectal exam stating she is no longer having black stools  Orders Placed This Encounter  Procedures  . CT Abdomen Pelvis WO IV Contrast  . Ambulatory referral to Smoking Cessation Program   Follow up in about 2 months (around 01/11/2024) for with Primary Gastroenterologist.     Risks, benefits, and alternatives of the medications and treatment plan prescribed today were discussed, and patient expressed understanding. Plan follow-up as discussed or as needed if any worsening symptoms or change in condition.  Please excuse any typos as this was dictated with a voice recognition device  Chief Complaint:  Abdominal pain   HPI:  Emily [name] is a 43 y.o. female with a history of alcohol abuse with alcoholic cirrhosis/advanced fibrosis , anxiety, T2DM, GERD, hypothyroidism, abdominal pain with nausea,  seen today for acute work-in. We typically see her for  routine follow up of her cirrhosis and chronic abdominal pain, nausea, and vomiting. Most recent GI work-up to date includes an EGD 08/28/2022 with LA grade a esophagitis, scattered gastric erythema for which biopsies were negative for H. pylori.  There was no evidence of esophageal or  gastric varices.  Pelvic ultrasound 09/07/2022 showed multiloculated cystic mass in the left ovary measuring 5.4 cm felt to be a potentially complicated/hemorrhagic cyst.  RUQ ultrasound December 2024 showed fatty liver and cholelithiasis.  Noncontrasted CTAP January 2025 showed hepatic cirrhosis.  MRCP/10/2023 showed hepatic cirrhosis without suspicious liver lesions and known cholelithiasis.  GES 07/31/2023 showed abnormally rapid gastric emptying but patient has not had gastric surgery to suggest dumping syndrome.  Her GI symptoms have been largely attributed to dysmotility related to her poorly controlled diabetes.  We have been managing her GI symptoms with PPI, antiemetics, and a short course of Reglan.  Tight glycemic control has been emphasized to the patient.  She has had some rectal pain that is been attributed to a posterior anal fissure which she reports has improved with nifedipine cream.  From a cirrhosis standpoint she has been sober from alcohol for 1 year and 3 months.  She is relatively well compensated without any evidence of lower extremity edema or ascites.  She has not had any PSC.  She does not have varices.  Imaging has been without suspicious liver lesions.  She presented to clinic today  for worsening abdominal pain since her liver bx on 10/20/2023.  She tells me her abdominal pain shifts positions from the right upper quadrant region to the epigastric and left upper quadrant regions.  Here lately she has had more abdominal pain with position change such as bending over.  She also reports postprandial abdominal pain, nausea and vomiting.  She tells me her diet is significantly limited because of her nausea and vomiting. Her weight is down to 188.  She reports Phenergan  is helpful for nausea.      Past GI History: GES 07/31/23--Abnormally rapid gastric emptying.  MRCP 07/27/23--Enlarged fatty liver. Hepatic cirrhosis, no suspicious lesion.Cholelithiasis.Splenomegaly.Right renal cysts CTAP  WO IV 04/2023--No renal calculi hydronephrosis.No bowel obstruction. Lobulated border of the liver consistent with hepatic cirrhosis.Hepatosplenomegaly.Wall thickening of the urinary bladder which can be seen with under distention versus cystitis.5.1 mm subpleural left lower lobe pulmonary nodule. By the Fleischner criteria no further follow-up is recommended in the non this patient in the high risk patient a CT scan of the chest in one year is suggested. RUQ 03/2023--Fatty infiltration of the liver. There is cholelithiasis.  US  Pelvis 09/07/22--There is a nonspecific multiloculated cystic mass in the left ovary measuring 5.4 x 2.9 x 3.4 cm. This could be a complicated/hemorrhagic cyst. Cystic neoplasm not excluded.  EGD 08/28/2022--LA grade A esophagitis, scattered gastric erythema-antral biopsies negative for H. pylori, no esophageal nor gastric varices CTAP W IV  06/2022--Hepatomegaly and hepatic steatosis.Spleen upper limits of normal in size.Tumefactive sludge versus and 8 mm gallbladder polyp. Recommend 6 month follow-up ultrasound. US  Abdominal Wall 05/19/2022--Complex 1.8 x 0.8 x 1.5 cm abscess collection with surrounding soft tissue hyperemia, along the anterior right lower quadrant abdominal wall.  MR Abdomen 04/02/2022-- Hepatomegaly, cirrhosis, and severe fatty infiltration of the liver. No liver lesions.Splenomegaly RUQ U/S 01/2022--No cholelithiasis is seen. The common duct is within normal limits.Hepatic steatosis and hepatomegaly MR Abdomen 01/24/2022--Cirrhosis, hepatomegaly, and severe fatty infiltration of the liver. Splenomegaly. Benign renal cysts Colonoscopy 11/2021--normal examined ileum, multiple large hyperplastic polyps, 3-year recall, random colon  biopsies negative for microscopic colitis. US  Guided Liver Bx 08/2021--stage 2, grade 3 steatohepatitis EGD 07/2021--LA grade B reflux esophagitis--biopsied.  Multiple small superficial gastric ulcers-suspect NSAID related-gastric biopsies  negative for H. pylori.  No esophageal nor gastric varices.  Repeat in 2 years for screening purposes    Past Medical History:  Diagnosis Date  . Anxiety   . Asthma (*)   . Bipolar 1 disorder (*) 12/20/2020  . Cirrhosis, alcoholic (*)   . Depression   . Diabetes mellitus (*)   . Diabetes mellitus, type 2 (*)   . Diabetic neuropathy (*)   . Disease of thyroid gland   . Fatty liver   . GERD (gastroesophageal reflux disease)   . Hyperlipidemia   . Hypertension   . Hypokalemia 07/11/2019  . Low-normal folate (03/2023) 04/08/2023  . Lymphocytosis 04/04/2023  . Macrocytosis without anemia 04/04/2023  . Miscarriage (*)   . Necrotizing fasciitis (*)   . Neutrophilia 04/04/2023  . Pneumonia   . Renal failure    AKI, high Creatinine  . Tachycardia   . Tachycardia   . Vaginal odor 09/30/2017    Past Surgical History:  Procedure Laterality Date  . Abdominal surgery for neccrotizing fasciitis    . Colonoscopy  12/06/2021   Dr. Amos - [facility]  . Dilation and curettage of uterus    . Upper gastrointestinal endoscopy     Dr. Amos [facility]     Family & Social History:  family history includes Asthma in her mother; Bladder Cancer in her paternal grandfather; Colon cancer in an other family member; Heart disease in her father; Hypertension in her mother; Lung disease in her mother; Prostate cancer in her paternal grandfather; Thyroid disease in her mother.    reports that she has been smoking cigarettes. She started smoking about 26 years ago. She has a 26.6 pack-year smoking history. She has never used smokeless tobacco. She reports that she does not currently use alcohol after a past usage of about 6.0 standard drinks of alcohol per week. She reports that she does not use drugs.     Review of Systems: Review of Systems  Constitutional:  Negative for appetite change, chills, fatigue, fever and unexpected weight change.  HENT:  Negative for trouble swallowing.   Respiratory:  Negative  for shortness of breath.   Cardiovascular:  Negative for chest pain.  Gastrointestinal:  Positive for abdominal pain, nausea and vomiting. Negative for blood in stool, constipation and diarrhea.  Neurological:  Negative for dizziness.      Physical Exam:  BP 135/84   Pulse 86   Temp 97.8 F (36.6 C)   Ht 5' 3 (1.6 m)   Wt 188 lb 3.2 oz (85.4 kg)   LMP 10/08/2023 (Approximate)   BMI 33.34 kg/m   Physical Exam Vitals reviewed.  Constitutional:      General: She is not in acute distress.    Appearance: Normal appearance. She is not ill-appearing, toxic-appearing or diaphoretic.  HENT:     Head: Normocephalic and atraumatic.   Eyes:     General: No scleral icterus.    Extraocular Movements: Extraocular movements intact.    Cardiovascular:     Rate and Rhythm: Normal rate.  Pulmonary:     Effort: Pulmonary effort is normal. No respiratory distress.  Abdominal:     General: Bowel sounds are normal. There is no distension.     Palpations: Abdomen is soft.     Tenderness: There is abdominal tenderness (epigastric  and RUQ ttp). There is no guarding or rebound.  Genitourinary:    Comments: Declined stating rectal pain is better with hemorrhoid cream. She is not having anymore bleeding. She is moving her bowels regularly now.   Skin:    General: Skin is warm.     Coloration: Skin is not jaundiced.     Findings: No bruising.  Neurological:     General: No focal deficit present.     Mental Status: She is alert and oriented to person, place, and time.   Psychiatric:        Mood and Affect: Mood normal.        Behavior: Behavior normal.        Thought Content: Thought content normal.        Judgment: Judgment normal.      Patient's Medications       * Accurate as of November 10, 2023 11:59 PM. Reflects encounter med changes as of last refresh          Continued Medications      Instructions  amLODIPine  besylate 5 mg tablet Commonly known as: NORVASC   5 mg, Oral,  Daily   BD PEN NEEDLE NANO 2ND GEN 32G X 4 MM Misc Generic drug: Insulin  Pen Needle  USE 2 DAILY FOR ADMINISTRATION   clonazePAM 1 mg tablet Commonly known as: KLONOPIN  1 mg, 2 times a day   COMP AIR COMPRESSOR NEBULIZER Misc  AS DIRECTED   cyclobenzaprine  10 mg tablet Commonly known as: FLEXERIL   10 mg, 3 times a day as needed   DEXCOM G7 RECEIVER Devi  See admin instructions   DEXCOM G7 SENSOR Misc  See admin instructions   dicyclomine  10 mg capsule Commonly known as: BENTYL   TAKE 1 CAPSULE (10MG ) BY MOUTH 30 (THIRTY) MINUTES BEFORE MEALS & AT BEDTIME. AS NEEDED   ergocalciferol  50,000 units Caps capsule Commonly known as: Vitamin D2  1 capsule, Weekly at 0900   famotidine 20 mg tablet Commonly known as: PEPCID  20 mg, Oral, 2 times a day   fluticasone  propionate 50 mcg/actuation nasal spray Commonly known as: FLONASE   2 sprays, Daily   folic acid 1 mg tablet  1 mg, Oral, Daily   HUMALOG  KWIKPEN 100 UNIT/ML Sopn injection Generic drug: insulin  lispro (1 Unit Dial )  INJECT TEN UNITS INTO THE SKIN 3 (THREE) TIMES DAILY WITH MEALS.   HYDROcodone-homatropine 5-1.5 mg/5 mL solution Commonly known as: HYCODAN,HYDROMET  5 mLs   INCRUSE ELLIPTA  62.5 MCG/ACT inhalation powder Generic drug: umeclidinium bromide   2 puffs, Daily   ipratropium 0.03% nasal spray Commonly known as: ATROVENT   SMARTSIG:2 Spray(s) Both Nares Every 12 Hours   ipratropium-albuterol  0.5-2.5 mg/3 mL ML Soln nebulizer solution Commonly known as: DUONEB  3 mLs, Every 6 hours as needed   lamoTRIgine 150 mg tablet Commonly known as: LAMICTAL  150 mg, Oral, Daily   LANTUS  SOLOSTAR 100 UNIT/ML Sopn  15 Units, Daily   levothyroxine  sodium 75 mcg tablet Commonly known as: SYNTHROID ,LEVOTHROID,LEVOXYL   75 mcg, Oral, Daily   megestrol  40 mg tablet Commonly known as: MEGACE   3 tablets a day for 5 days, 2 tablets a day for 5 days then 1 tablet daily   meloxicam 7.5 mg tablet Commonly  known as: MOBIC  7.5 mg, Daily   metFORMIN  ER 500 mg 24 hr tablet Commonly known as: GLUCOPHAGE -XR  500 mg, 2 times a day   montelukast  10 MG tablet Commonly known as: SINGULAIR   10 mg, Oral, At  bedtime   nitroGLYCERIN  0.4 mg SL tablet Commonly known as: NITROSTAT   PLACE 1 TABLET UNDER THE TONGUE EVERY 5 MINUTES AS NEEDED FOR CHEST PAIN.   omeprazole  40 mg capsule Commonly known as: PRILOSEC  40 mg, Oral, 2 times a day   paroxetine 10 mg tablet Commonly known as: PAXIL  10 mg, At bedtime   polyethylene glycol 17 g packet Commonly known as: MIRALAX   17 g, Oral, Daily   prochlorperazine  10 MG tablet Commonly known as: COMPAZINE   10 mg, Oral, Every 6 hours as needed   promethazine -dextromethorphan 6.25-15 MG/5ML syrup Commonly known as: PROMETHAZINE -DM  5 mLs, Every 4 hours as needed   propranolol  HCl 20 mg tablet Commonly known as: INDERAL   TAKE 1 TABLET BY MOUTH TWICE A DAY   pseudoephedrine  HCl 120 mg 12 hr tablet Commonly known as: SUDAFED  120 mg, 2 times a day   rosuvastatin  calcium  5 mg tablet Commonly known as: CRESTOR   5 mg, Daily   SYNJARDY  XR 12.08-998 MG Tb24 tablet Generic drug: Empagliflozin-metFORMIN  HCl ER  2 tablets, Daily   VENTOLIN  HFA 108 (90 Base) MCG/ACT inhaler Generic drug: albuterol  sulfate HFA  SMARTSIG:2 Puff(s) By Mouth Every 6 Hours PRN   ziprasidone 60 mg capsule Commonly known as: GEODON  60 mg, Oral, At bedtime       Discontinued Medications    azithromycin  250 mg tablet Commonly known as: ZITHROMAX  Stopped by: Blane Hammed, PA-C   fluconazole  100 mg tablet Commonly known as: DIFLUCAN  Stopped by: Blane Hammed, PA-C   naproxen  500 mg tablet Commonly known as: NAPROSYN  Stopped by: Blane Hammed, PA-C   nifedipine 0.2% Stopped by: Blane Hammed, PA-C   nystatin  cream Commonly known as: MYCOSTATIN  Stopped by: Blane Hammed, PA-C   solifenacin  succinate 5 mg tablet Commonly known as: VESICARE  Stopped  by: Blane Hammed, PA-C       *Some images could not be shown.

## 2023-11-11 ENCOUNTER — Telehealth: Payer: Self-pay

## 2023-11-11 ENCOUNTER — Encounter: Payer: Self-pay | Admitting: Internal Medicine

## 2023-11-11 DIAGNOSIS — K8020 Calculus of gallbladder without cholecystitis without obstruction: Secondary | ICD-10-CM | POA: Diagnosis not present

## 2023-11-11 DIAGNOSIS — Z9889 Other specified postprocedural states: Secondary | ICD-10-CM | POA: Diagnosis not present

## 2023-11-11 DIAGNOSIS — K7460 Unspecified cirrhosis of liver: Secondary | ICD-10-CM | POA: Diagnosis not present

## 2023-11-11 DIAGNOSIS — N838 Other noninflammatory disorders of ovary, fallopian tube and broad ligament: Secondary | ICD-10-CM | POA: Diagnosis not present

## 2023-11-11 DIAGNOSIS — J45909 Unspecified asthma, uncomplicated: Secondary | ICD-10-CM

## 2023-11-11 DIAGNOSIS — R1084 Generalized abdominal pain: Secondary | ICD-10-CM | POA: Diagnosis not present

## 2023-11-11 MED ORDER — IPRATROPIUM-ALBUTEROL 0.5-2.5 (3) MG/3ML IN SOLN: 3.0000 mL | Freq: Three times a day (TID) | RESPIRATORY_TRACT | 5 refills | Status: DC

## 2023-11-11 NOTE — Telephone Encounter (Signed)
Refill for duoneb sent

## 2023-11-11 NOTE — Progress Notes (Deleted)
 Cardiology Office Note    Date:  11/11/2023  ID:  MAIRA CHRISTON, DOB 18-Jun-1980, MRN [id number] PCP:  [name], NP  Cardiologist:  None  Electrophysiologist:  None   Chief Complaint: ***  History of Present Illness: .    Emily [name] is a 43 y.o. female with visit-pertinent history of coronary artery calcification noted on CT, chest pain, palpitations, hypertension, hyperlipidemia, hypothyroidism, type 2 diabetes, alcoholic cirrhosis of the liver, bipolar disorder, anxiety, asthma, tobacco use and GERD.  Patient has history of coronary calcification noted on CT.  Lexiscan  Myoview  in 2022 was low risk, no evidence of ischemia.  Coronary calcium  score in 02/2022 was 56, 99th percentile.  Echocardiogram in 11/2022 showed EF 60 to 65%, normal LV function, no RWMA, normal RV, no significant valvular abnormalities.  Patient was evaluated in the ED on 03/22/2023 in setting of palpitations, elevated BP and chest tightness.  Troponin was negative, CT of the chest was negative for PE.  EKG showed sinus rhythm, nonspecific ST abnormality, no significant change from prior EKG.  Patient was discharged home advised to follow-up with cardiology.  He was seen in clinic on 03/23/2023 by Damien Braver, NP, patient reported that she been stable more cardiac standpoint, she denied any further palpitations or recurrent chest pain.    Coronary artery calcification/chest pain: Evidence of coronary calcium  on prior CT.  Echo in 11/2022 showed EF 60 to 65%, normal LV function, no RWMA, normal RV, no significant valvular abnormalities. Today she reports Not an ideal candidate for coronary CT angiogram in setting of contrast media allergy.   Palpitations:  Hypertension: Blood pressure today  Hyperlipidemia: LDL was 128 in 12/2022.  Patient is not on statin therapy at this time in setting of cirrhosis.  Hypothyroidism: TSH was 1.91 in 02/2023.  She is followed by endocrinology.  Type 2  diabetes:  Cirrhosis: Pending possible transplant the future, patient followed by [facility].  Labwork independently reviewed:   ROS: .   *** denies chest pain, shortness of breath, lower extremity edema, fatigue, palpitations, melena, hematuria, hemoptysis, diaphoresis, weakness, presyncope, syncope, orthopnea, and PND.  All other systems are reviewed and otherwise negative.  Studies Reviewed: SABRA    EKG:  EKG is ordered today, personally reviewed, demonstrating ***     CV Studies: Cardiac studies reviewed are outlined and summarized above. Otherwise please see EMR for full report. Cardiac Studies & Procedures   ______________________________________________________________________________________________   STRESS TESTS  MYOCARDIAL PERFUSION IMAGING 08/13/2020  Interpretation Summary  Nuclear stress EF: 64%.  There was no ST segment deviation noted during stress.  The left ventricular ejection fraction is normal (55-65%).  The study is normal.  This is a low risk study.   ECHOCARDIOGRAM  ECHOCARDIOGRAM COMPLETE 12/16/2022  Narrative ECHOCARDIOGRAM REPORT    Patient Name:   Emily [name] Date of Exam: 12/16/2022 Medical Rec #:  [id number]         Height:       63.0 in Accession #:    [id number]        Weight:       200.6 lb Date of Birth:  [birth year]         BSA:          1.936 m Patient Age:    42 years          BP:           130/97 mmHg Patient Gender: F  HR:           68 bpm. Exam Location:  [address]  Procedure: 2D Echo, 3D Echo, Cardiac Doppler, Color Doppler and Strain Analysis  Indications:    R06.00 Dyspnea  History:        Patient has prior history of Echocardiogram examinations, most recent 07/25/2021. Signs/Symptoms:Chest Pain; Risk Factors:Current Smoker and Hypertension. Cardiomegaly.  Sonographer:    Waldo Guadalajara RCS Referring Phys: 64 MURALI RAMASWAMY  IMPRESSIONS   1. Left ventricular ejection fraction, by estimation,  is 60 to 65%. The left ventricle has normal function. The left ventricle has no regional wall motion abnormalities. Left ventricular diastolic parameters were normal. 2. Right ventricular systolic function is normal. The right ventricular size is normal. 3. The mitral valve is normal in structure. No evidence of mitral valve regurgitation. No evidence of mitral stenosis. 4. The aortic valve is grossly normal. Aortic valve regurgitation is not visualized. No aortic stenosis is present. 5. The inferior vena cava is normal in size with greater than 50% respiratory variability, suggesting right atrial pressure of 3 mmHg.  Comparison(s): No significant change from prior study.  Conclusion(s)/Recommendation(s): Normal biventricular function without evidence of hemodynamically significant valvular heart disease.  FINDINGS Left Ventricle: Left ventricular ejection fraction, by estimation, is 60 to 65%. The left ventricle has normal function. The left ventricle has no regional wall motion abnormalities. The left ventricular internal cavity size was normal in size. There is no left ventricular hypertrophy. Left ventricular diastolic parameters were normal.  Right Ventricle: The right ventricular size is normal. No increase in right ventricular wall thickness. Right ventricular systolic function is normal.  Left Atrium: Left atrial size was normal in size.  Right Atrium: Right atrial size was normal in size.  Pericardium: There is no evidence of pericardial effusion.  Mitral Valve: The mitral valve is normal in structure. No evidence of mitral valve regurgitation. No evidence of mitral valve stenosis.  Tricuspid Valve: The tricuspid valve is normal in structure. Tricuspid valve regurgitation is trivial. No evidence of tricuspid stenosis.  Aortic Valve: The aortic valve is grossly normal. Aortic valve regurgitation is not visualized. No aortic stenosis is present.  Pulmonic Valve: The pulmonic valve  was not well visualized. Pulmonic valve regurgitation is not visualized.  Aorta: The aortic root, ascending aorta, aortic arch and descending aorta are all structurally normal, with no evidence of dilitation or obstruction.  Venous: The inferior vena cava is normal in size with greater than 50% respiratory variability, suggesting right atrial pressure of 3 mmHg.  IAS/Shunts: The atrial septum is grossly normal.   LEFT VENTRICLE PLAX 2D LVIDd:         5.20 cm   Diastology LVIDs:         3.40 cm   LV e' medial:    9.25 cm/s LV PW:         0.80 cm   LV E/e' medial:  9.7 LV IVS:        0.60 cm   LV e' lateral:   12.70 cm/s LVOT diam:     1.90 cm   LV E/e' lateral: 7.1 LV SV:         57 LV SV Index:   30 LVOT Area:     2.84 cm  3D Volume EF: 3D EF:        56 % LV EDV:       112 ml LV ESV:       49 ml LV SV:  63 ml  RIGHT VENTRICLE RV Basal diam:  2.40 cm RV S prime:     11.50 cm/s TAPSE (M-mode): 2.2 cm  LEFT ATRIUM             Index        RIGHT ATRIUM          Index LA diam:        3.00 cm 1.55 cm/m   RA Area:     9.08 cm LA Vol (A2C):   45.4 ml 23.45 ml/m  RA Volume:   17.80 ml 9.19 ml/m LA Vol (A4C):   26.3 ml 13.58 ml/m LA Biplane Vol: 35.8 ml 18.49 ml/m AORTIC VALVE LVOT Vmax:   92.60 cm/s LVOT Vmean:  61.900 cm/s LVOT VTI:    0.202 m  AORTA Ao Root diam: 3.10 cm Ao Asc diam:  2.90 cm  MITRAL VALVE MV Area (PHT):             SHUNTS MV Decel Time:             Systemic VTI:  0.20 m MV E velocity: 89.60 cm/s  Systemic Diam: 1.90 cm MV A velocity: 89.60 cm/s MV E/A ratio:  1.00  Shelda Bruckner MD Electronically signed by Shelda Bruckner MD Signature Date/Time: 12/16/2022/4:52:35 PM    Final    MONITORS  LONG TERM MONITOR (3-14 DAYS) 01/17/2021  Narrative  Sinus rhythm  Rare PACs and rare PVC  No significant arrhythmias observed   Patch Wear Time:  9 days and 4 hours (2022-09-16T15:53:39-0400 to  2022-09-25T20:15:53-0400)  Patient had a min HR of 60 bpm, max HR of 123 bpm, and avg HR of 78 bpm. Predominant underlying rhythm was Sinus Rhythm. Isolated SVEs were rare (<1.0%), and no SVE Couplets or SVE Triplets were present. Isolated VEs were rare (<1.0%), and no VE Couplets or VE Triplets were present.   CT SCANS  CT CORONARY MORPH W/CTA COR W/SCORE 03/12/2022  Addendum 03/13/2022  7:15 PM ADDENDUM REPORT: 03/13/2022 19:13  HISTORY: Chest pain  EXAM: Cardiac/Coronary CT  TECHNIQUE: The patient was scanned on a [employer].  PROTOCOL: A 120 kV prospective scan was triggered in the descending thoracic aorta at 111 HU's. Axial non-contrast 3 mm slices were carried out through the heart. The data set was analyzed on a dedicated work station and scored using the Agatston method. Gantry rotation speed was 250 msecs and collimation was .6 mm. Heart rate was optimized medically and sl NTG was given. The 3D data set was reconstructed in 5% intervals of the 35-75 % of the R-R cycle. Systolic and diastolic phases were analyzed on a dedicated work station using MPR, MIP and VRT modes. The patient received 100mL OMNIPAQUE  IOHEXOL  350 MG/ML SOLN of contrast.  FINDINGS: Coronary calcium  score: The patient's coronary artery calcium  score is 56, which places the patient in the 99th percentile.  Coronary arteries: Normal coronary origins.  Right dominance.  Right Coronary Artery: Normal caliber vessel, gives rise to PDA. No significant plaque or stenosis.  Left Main Coronary Artery: Not visualized on contrast images due to slab artifact. Appears to have normal origin on noncontrast images.  Left Anterior Descending Coronary Artery: Ostial LAD not visualized due to slab artifact. Proximal LAD with mixed calcified and noncalcified plaque with 1-24% stenosis. Gives rise to 2 small diagonal branches.  Left Circumflex Artery: Normal caliber vessel. Proximal vessel  with mixed calcified and noncalcified plaque with 1-24% stenosis. Gives rise to 2 OM branches.  Aorta:  Normal size, 28 mm at the mid ascending aorta (level of the PA bifurcation) measured double oblique. No aortic atherosclerosis. No dissection seen in visualized portions of the aorta.  Aortic Valve: No calcifications. Trileaflet.  Other findings:  Normal pulmonary vein drainage into the left atrium.  Normal left atrial appendage without a thrombus.  Normal size of the pulmonary artery.  Normal appearance of the pericardium.  Significant slab artifact.  IMPRESSION: 1. CAD-RADS N: Non-diagnostic study. Obstructive CAD can't be excluded in left main due to significant slab artifact.  2. Coronary calcium  score of 56. This was 99th percentile for age and sex matched control.  3. Based on calcium  score images, there is normal coronary origin/anatomy. However, on contrast images, there is significant slab artifact, and the left main and ostial LAD cannot be visualized.  INTERPRETATION:  CAD-RADS N: Non-diagnostic study. Obstructive CAD can't be excluded. Alternative evaluation is recommended.   Electronically Signed By: Shelda Bruckner M.D. On: 03/13/2022 19:13  Narrative EXAM: OVER-READ INTERPRETATION  CT CHEST  The following report is a limited chest CT over-read performed by radiologist Dr. Rea Marc of [facility] on 03/12/2022. This over-read does not include interpretation of cardiac or coronary anatomy or pathology. The coronary CTA interpretation by the cardiologist is attached.  COMPARISON:  Chest CT dated July 25, 2020  FINDINGS: Vascular: No significant vascular findings. Normal heart size. No pericardial effusion.  Mediastinum/Nodes: No enlarged mediastinal or axillary lymph nodes. Thyroid gland, trachea, and esophagus demonstrate no significant findings.  Lungs/Pleura: Lungs are clear. No pleural effusion or  pneumothorax. Solid pulmonary nodule of the right middle lobe measuring 4 mm on series 11, image 25, stable when compared with prior exam, further follow-up imaging is needed given greater than 1 year stability.  Upper Abdomen: No acute abnormality.  Musculoskeletal: No chest wall mass or suspicious bone lesions identified.  IMPRESSION: No acute extracardiac findings.  Electronically Signed: By: Rea Marc M.D. On: 03/12/2022 14:56     ______________________________________________________________________________________________       Current Reported Medications:.    No outpatient medications have been marked as taking for the 11/12/23 encounter (Appointment) with Aila Terra D, NP.    Physical Exam:    VS:  There were no vitals taken for this visit.   Wt Readings from Last 3 Encounters:  09/29/23 188 lb (85.3 kg)  09/22/23 192 lb 2 oz (87.1 kg)  09/20/23 192 lb (87.1 kg)    GEN: Well nourished, well developed in no acute distress NECK: No JVD; No carotid bruits CARDIAC: ***RRR, no murmurs, rubs, gallops RESPIRATORY:  Clear to auscultation without rales, wheezing or rhonchi  ABDOMEN: Soft, non-tender, non-distended EXTREMITIES:  No edema; No acute deformity     Asessement and Plan:.     ***     Disposition: F/u with ***  Signed, Dayonna Selbe D Myisha Pickerel, NP

## 2023-11-12 ENCOUNTER — Ambulatory Visit: Attending: Cardiology | Admitting: Cardiology

## 2023-11-12 ENCOUNTER — Encounter: Payer: Self-pay | Admitting: Internal Medicine

## 2023-11-12 DIAGNOSIS — I10 Essential (primary) hypertension: Secondary | ICD-10-CM

## 2023-11-12 DIAGNOSIS — I2510 Atherosclerotic heart disease of native coronary artery without angina pectoris: Secondary | ICD-10-CM

## 2023-11-12 DIAGNOSIS — Z794 Long term (current) use of insulin: Secondary | ICD-10-CM

## 2023-11-12 DIAGNOSIS — R002 Palpitations: Secondary | ICD-10-CM

## 2023-11-12 DIAGNOSIS — E039 Hypothyroidism, unspecified: Secondary | ICD-10-CM

## 2023-11-12 DIAGNOSIS — E785 Hyperlipidemia, unspecified: Secondary | ICD-10-CM

## 2023-11-13 DIAGNOSIS — F410 Panic disorder [episodic paroxysmal anxiety] without agoraphobia: Secondary | ICD-10-CM | POA: Diagnosis not present

## 2023-11-13 DIAGNOSIS — F3170 Bipolar disorder, currently in remission, most recent episode unspecified: Secondary | ICD-10-CM | POA: Diagnosis not present

## 2023-11-13 DIAGNOSIS — F4312 Post-traumatic stress disorder, chronic: Secondary | ICD-10-CM | POA: Diagnosis not present

## 2023-11-13 DIAGNOSIS — F411 Generalized anxiety disorder: Secondary | ICD-10-CM | POA: Diagnosis not present

## 2023-11-13 NOTE — Telephone Encounter (Signed)
 Per CT in CareEverywhere: Lower Thorax: Near scarring/atelectasis at the left lower lobe.   Patient asking for your review of CT Abd/Pelvis done 11/11/23. Thank you!

## 2023-11-13 NOTE — Telephone Encounter (Signed)
 Per [facility] patient picked up rx yesterday afternoon. Nothing further needed at this time.

## 2023-11-17 ENCOUNTER — Ambulatory Visit: Attending: Cardiology | Admitting: Cardiology

## 2023-11-17 DIAGNOSIS — R1084 Generalized abdominal pain: Secondary | ICD-10-CM | POA: Diagnosis not present

## 2023-11-17 DIAGNOSIS — K7460 Unspecified cirrhosis of liver: Secondary | ICD-10-CM | POA: Diagnosis not present

## 2023-11-17 DIAGNOSIS — R110 Nausea: Secondary | ICD-10-CM | POA: Diagnosis not present

## 2023-11-17 NOTE — Progress Notes (Deleted)
 Office Visit Note  Patient: Emily [name]             Date of Birth: [birth year]           MRN: [id number]             PCP: [name], NP Referring: [name], NP Visit Date: 11/18/2023   Subjective:  No chief complaint on file.   History of Present Illness: Emily [name] is a 43 y.o. female here for follow up ***   Previous HPI 08/26/23 Emily [name] is a 43 y.o. female here for follow up for continued joint inflammation and ongoing left knee pain.  Lab testing at our initial visit was unremarkable she had a smooth muscle antibody positive at 36 which is unchanged compared to prior result.  RNP was low positive at 1.9 much decreased from previous greater than 8 result in 2020.  The other antibody markers and serum complement tests were normal.  Since last visit she also had a follow-up in primary care office due to chronic productive coughing was also experiencing palpitations that she thinks were related to the previous steroid injection in her knees.  She had a new chest x-ray and EKG these were unremarkable.   Previous HPI 08/13/23 Emily [name] is a 43 year old female with cirrhosis and joint pain who presents with chronic joint pain and abnormal labs including positive ANA and smooth muscle antibodies.   She experiences joint pain and stiffness primarily in her knees, back, legs, and toes. The pain occurs daily, especially upon waking and during activities like sweeping. She has had fluid drawn from her right knee and received cortisone injections about a month ago, which initially helped but caused her heart to flutter. The pain in her knees persists, with swelling only in the left knee now.   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 She experiences frequent nausea and vomiting, as well as gallstones. She is unable to take many over-the-counter anti-inflammatory medications but  occasionally takes naproxen , which she finds helpful. She last took two naproxen  tablets a few days ago.   She has a positive ANA test with a titer of 1:320, raising concerns about potential autoimmune conditions. No new or changing skin rashes, but she notes darker areas on her cheeks that have worsened over time. She experiences sun sensitivity, with her skin burning easily. Her mother has a history of sarcoidosis.   No significant circulation problems, though her left foot swells if she is on it for a long time. No varicose veins, mouth or nose ulcers, hair loss, or significant sun sensitivity beyond burning. She has a history of strep throat treated with steroids about a month ago, which made her feel hot and 'crazy'.   She has a history of smoking and drinking, which she attributes to the loss of her teeth. She is currently on SSI for her mental state and cirrhosis. She takes thyroid medication (levothyroxine  75 mcg) for hypothyroidism, which she has been on for years.       Labs reviewed 2023 ASMA 36   2020 RNP >8.0    No Rheumatology ROS completed.   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   Bulimia nervosa 06/15/2023   Borderline personality disorder (HCC) 06/15/2023   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 06/15/2023   Chronic prescription benzodiazepine use 06/15/2023   History of alcohol use disorder in sustained remission 06/15/2023   Polypharmacy 06/15/2023   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   Caffeine-induced insomnia with snoring 06/15/2023    Irregular bleeding 06/02/2023   Pelvic cramping 06/02/2023   Urinary frequency 06/02/2023   Pregnancy examination or test, negative result 06/02/2023   Missed periods 06/02/2023   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   Sebaceous cyst of labia 02/03/2023   Cyst of left ovary 08/11/2022   ASCUS with positive high risk HPV cervical 08/11/2022   Moody 07/30/2022   Hot flashes 07/30/2022   Friable cervix 07/30/2022   Spotting 07/30/2022   Amenorrhea, secondary 07/30/2022   Bartholin cyst 04/30/2022   Irregular periods 04/30/2022   Dental caries 03/11/2022   Tick bite of back 12/20/2020   Chronic bronchitis (HCC) 07/09/2020   Breast tenderness 09/20/2018   Gastroesophageal reflux disease 09/16/2018   Shortness of breath 08/05/2018   Abnormal finding on lung imaging 08/05/2018   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   Swollen lymph nodes 01/06/2017   RUQ pain 10/08/2016   LLQ pain 10/08/2016   Menstrual period late 10/08/2016   Cyst of right Bartholin's gland 12/17/2015   Recurrent boils 06/18/2015   Type 2 diabetes mellitus with complication, without long-term current use of insulin  (HCC) 04/18/2015   Ingrowing toenail 04/18/2015   Vitamin D  deficiency 04/18/2015   Angular cheilitis 04/18/2015   Dyslipidemia 04/18/2015   Diabetes mellitus with complication in adult patient (HCC) 02/20/2015   Smoker 02/20/2015   Essential hypertension 02/20/2015   Pain in limb 02/20/2015   Myalgia 02/20/2015   Hypothyroidism 02/20/2015   Leg pain, inferior 02/20/2015   Toenail deformity 02/20/2015   Elevated liver enzymes 01/07/2012   Abnormal CT of liver 09/30/2011   Fatty liver 09/30/2011   G E REFLUX 11/30/2007   CHEST PAIN-UNSPECIFIED 11/30/2007   HEADACHE 10/21/2007   DYSPNEA 09/15/2007   UNSPECIFIED TACHYCARDIA 06/01/2007   COUGH 05/24/2007   TOBACCO ABUSE 05/20/2007   Pulmonary nodules 05/12/2007   DEPRESSION 04/19/2007   Asthma 04/19/2007    Past Medical History:  Diagnosis Date    Abnormal CT of liver    Anxiety    Asthma    Asthma    Bipolar 1 disorder (HCC)    Bipolar disorder (HCC)    Chronic pain    Cirrhosis (HCC)    Depression    Diabetes mellitus without complication (HCC)    Fatty liver    Gallstones    GERD (gastroesophageal reflux disease)    Headache(784.0)    Hyperlipidemia    Hypertension    Hypothyroidism    Lupus (systemic lupus erythematosus) (HCC)    Pulmonary nodule    Tachycardia    Thyroid disease    Vitamin D  deficiency     Family History  Problem Relation Age of Onset   Asthma Mother    Valvular heart disease Father        Mitral valve repair Dr. Dusty 2007   Diabetes Maternal Grandmother    Hypertension Maternal Grandmother    Past Surgical History:  Procedure Laterality Date   DILATION AND CURETTAGE OF UTERUS     1 yr ago   UPPER  GI ENDOSCOPY  08/28/2022   Social History   Social History Narrative   Not on file   Immunization History  Administered Date(s) Administered   Influenza Split 03/22/2014   Influenza Whole 01/13/2008   Influenza, Quadrivalent, Recombinant, Inj, Pf 01/23/2022   Influenza, Seasonal, Injecte, Preservative Fre 04/18/2015   Influenza,inj,Quad PF,6+ Mos 04/18/2015, 01/08/2022   PFIZER(Purple Top)SARS-COV-2 Vaccination 03/12/2020, 04/16/2020   Pneumococcal Polysaccharide-23 05/10/2012   Tdap 01/02/2006, 08/08/2015     Objective: Vital Signs: There were no vitals taken for this visit.   Physical Exam   Musculoskeletal Exam: ***  CDAI Exam: CDAI Score: -- Patient Global: --; Provider Global: -- Swollen: --; Tender: -- Joint Exam 11/18/2023   No joint exam has been documented for this visit   There is currently no information documented on the homunculus. Go to the Rheumatology activity and complete the homunculus joint exam.  Investigation: No additional findings.  Imaging: DG Chest 2 View Result Date: 11/03/2023 CLINICAL DATA:  Pleuritic chest pain.  Asthma. EXAM: CHEST - 2 VIEW  COMPARISON:  08/24/2023 FINDINGS: Normal-sized heart. Clear lungs with normal vascularity. Mild thoracic spine degenerative changes. IMPRESSION: No active cardiopulmonary disease. Electronically Signed   By: Elspeth Bathe M.D.   On: 11/03/2023 18:38    Recent Labs: Lab Results  Component Value Date   WBC 8.7 09/20/2023   HGB 14.4 09/20/2023   PLT 162 09/20/2023   NA 133 (L) 09/20/2023   K 3.7 09/20/2023   CL 98 09/20/2023   CO2 24 09/20/2023   GLUCOSE 127 (H) 09/20/2023   BUN <5 (L) 09/20/2023   CREATININE 0.58 09/20/2023   BILITOT 0.9 08/28/2023   ALKPHOS 224 (H) 08/28/2023   AST 75 (H) 08/28/2023   ALT 31 08/28/2023   PROT 7.5 08/28/2023   ALBUMIN 3.9 08/28/2023   CALCIUM  9.3 09/20/2023   GFRAA 129 06/01/2018    Speciality Comments: No specialty comments available.  Procedures:  No procedures performed Allergies: Bactrim  [sulfamethoxazole -trimethoprim ], Ciprofloxacin, Clindamycin /lincomycin, Flagyl  [metronidazole ], Hydrocodone, Iodinated contrast media, Ondansetron, Liraglutide , Doxycycline , Penicillins, and Synjardy  xr [empagliflozin-metformin  hcl er]   Assessment / Plan:     Visit Diagnoses: No diagnosis found.  ***  Orders: No orders of the defined types were placed in this encounter.  No orders of the defined types were placed in this encounter.    Follow-Up Instructions: No follow-ups on file.   Lonni LELON Ester, MD  Note - This record has been created using [employer].  Chart creation errors have been sought, but may not always  have been located. Such creation errors do not reflect on  the standard of medical care.

## 2023-11-18 ENCOUNTER — Ambulatory Visit: Admitting: Internal Medicine

## 2023-11-19 ENCOUNTER — Ambulatory Visit: Admitting: Adult Health

## 2023-11-19 ENCOUNTER — Encounter: Payer: Self-pay | Admitting: Adult Health

## 2023-11-19 VITALS — BP 125/85 | HR 83 | Ht 63.5 in | Wt 188.0 lb

## 2023-11-19 DIAGNOSIS — R300 Dysuria: Secondary | ICD-10-CM | POA: Diagnosis not present

## 2023-11-19 DIAGNOSIS — R309 Painful micturition, unspecified: Secondary | ICD-10-CM | POA: Diagnosis not present

## 2023-11-19 DIAGNOSIS — N6311 Unspecified lump in the right breast, upper outer quadrant: Secondary | ICD-10-CM

## 2023-11-19 DIAGNOSIS — N644 Mastodynia: Secondary | ICD-10-CM

## 2023-11-19 LAB — POCT URINALYSIS DIPSTICK
Glucose, UA: POSITIVE — AB
Ketones, UA: NEGATIVE
Nitrite, UA: NEGATIVE
Protein, UA: NEGATIVE

## 2023-11-19 NOTE — Progress Notes (Signed)
  Subjective:     Patient ID: Emily [name], female   DOB: 08/11/1980, 43 y.o.   MRN: [id number]  HPI Emily [name] is a 43 year old white female,single, G1P0010, in complaining of sore breasts for 1 week.      Component Value Date/Time   DIAGPAP (A) 07/30/2022 1120    - Atypical squamous cells of undetermined significance (ASC-US )   HPVHIGH Positive (A) 07/30/2022 1120   ADEQPAP  07/30/2022 1120    Satisfactory for evaluation; transformation zone component PRESENT.   [place]po 08/13/22 mild dysplasia  PCP is Emily Slade NP  Review of Systems Sore breasts for 1 week +nipples hard Burning with urination  Reviewed past medical,surgical, social and family history. Reviewed medications and allergies.     Objective:   Physical Exam BP 125/85 (BP Location: Right Arm, Patient Position: Sitting, Cuff Size: Normal)   Pulse 83   Ht 5' 3.5 (1.613 m)   Wt 188 lb (85.3 kg)   LMP 10/29/2023 (Approximate)   BMI 32.78 kg/m  urine 3+ leuks, trace blood, and 2+ glucose     Skin warm and dry,  Breasts:no dominate palpable mass, retraction or nipple discharge on the left, on right has 1 cm mass in areola, no retraction or nipple discharge, and both breasts tender.  Fall risk is low  [facility] - 11/19/23 1355       Pregnancy Intention Screening   Does the patient want to become pregnant in the next year? No    Does the patient's partner want to become pregnant in the next year? No    Would the patient like to discuss contraceptive options today? No      Contraception Wrap Up   Current Method Abstinence    End Method Abstinence    Contraception Counseling Provided No          Assessment:     1. Pain with urination UA C&S sent to rule out UTI - POCT Urinalysis Dipstick - Urine Culture - Urinalysis, Routine w reflex microscopic  2. Burning with urination - POCT Urinalysis Dipstick - Urine Culture - Urinalysis, Routine w reflex microscopic  3. Breast tenderness (Primary) +soreness  for 1 week Will get diagnostic mammogram and US  at [facility] 12/03/23 at 2:40 pm to assess Can use ice and decrease caffeine  - US  LIMITED ULTRASOUND INCLUDING AXILLA RIGHT BREAST; Future - MM 3D DIAGNOSTIC MAMMOGRAM BILATERAL BREAST; Future - US  LIMITED ULTRASOUND INCLUDING AXILLA LEFT BREAST ; Future  4. Mass of upper outer quadrant of right breast - US  LIMITED ULTRASOUND INCLUDING AXILLA RIGHT BREAST; Future - MM 3D DIAGNOSTIC MAMMOGRAM BILATERAL BREAST; Future - US  LIMITED ULTRASOUND INCLUDING AXILLA LEFT BREAST ; Future     Plan:     Return in 3 weeks for pap

## 2023-11-20 ENCOUNTER — Telehealth: Admitting: Physician Assistant

## 2023-11-20 DIAGNOSIS — B9689 Other specified bacterial agents as the cause of diseases classified elsewhere: Secondary | ICD-10-CM

## 2023-11-20 DIAGNOSIS — J208 Acute bronchitis due to other specified organisms: Secondary | ICD-10-CM

## 2023-11-20 LAB — URINALYSIS, ROUTINE W REFLEX MICROSCOPIC

## 2023-11-20 MED ORDER — AZITHROMYCIN 250 MG PO TABS: ORAL_TABLET | ORAL | 0 refills | Status: AC

## 2023-11-20 MED ORDER — PROMETHAZINE-DM 6.25-15 MG/5ML PO SYRP: 5.0000 mL | ORAL_SOLUTION | Freq: Four times a day (QID) | ORAL | 0 refills | Status: DC | PRN

## 2023-11-20 NOTE — Progress Notes (Signed)
 Virtual Visit Consent   Emily [name], you are scheduled for a virtual visit with a [facility] provider today. Just as with appointments in the office, your consent must be obtained to participate. Your consent will be active for this visit and any virtual visit you may have with one of our providers in the next 365 days. If you have a MyChart account, a copy of this consent can be sent to you electronically.  As this is a virtual visit, video technology does not allow for your provider to perform a traditional examination. This may limit your provider's ability to fully assess your condition. If your provider identifies any concerns that need to be evaluated in person or the need to arrange testing (such as labs, EKG, etc.), we will make arrangements to do so. Although advances in technology are sophisticated, we cannot ensure that it will always work on either your end or our end. If the connection with a video visit is poor, the visit may have to be switched to a telephone visit. With either a video or telephone visit, we are not always able to ensure that we have a secure connection.  By engaging in this virtual visit, you consent to the provision of healthcare and authorize for your insurance to be billed (if applicable) for the services provided during this visit. Depending on your insurance coverage, you may receive a charge related to this service.  I need to obtain your verbal consent now. Are you willing to proceed with your visit today? Emily [name] has provided verbal consent on 11/20/2023 for a virtual visit (video or telephone). [name], PA-C  Date: 11/20/2023 4:35 PM   Virtual Visit via Video Note   IDelon CHRISTELLA Dickinson, connected with  Emily [name]  ([id number], 11/08/1980) on 11/20/23 at  4:15 PM EDT by a video-enabled telemedicine application and verified that I am speaking with the correct person using two identifiers.  Location: Patient: Virtual Visit  Location Patient: Home Provider: Virtual Visit Location Provider: Home Office   I discussed the limitations of evaluation and management by telemedicine and the availability of in person appointments. The patient expressed understanding and agreed to proceed.    History of Present Illness: Emily [name] is a 43 y.o. who identifies as a female who was assigned female at birth, and is being seen today for recurrent cough.  HPI: Cough This is a recurrent problem. The current episode started yesterday (worsened last night, seen on 10/29/23, seen on 09/30/23 and given Zpack and Promethazine  DM, seen on 10/01/23 for Covid 19). The problem has been gradually worsening. The problem occurs constantly. Associated symptoms include headaches. Pertinent negatives include no chest pain, chills, fever, nasal congestion, postnasal drip, sore throat, shortness of breath or wheezing. The symptoms are aggravated by lying down. She has tried OTC cough suppressant and prescription cough suppressant for the symptoms. The treatment provided no relief. Her past medical history is significant for bronchitis.     Problems:  Patient Active Problem List   Diagnosis Date Noted   Mass of upper outer quadrant of right breast 11/19/2023   Burning with urination 11/19/2023   Pain with urination 11/19/2023   Effusion, left knee 08/26/2023   Positive ANA (antinuclear antibody) 08/13/2023   Menorrhagia with irregular cycle 07/08/2023   Hepatic cirrhosis (HCC) 07/08/2023   PTSD (post-traumatic stress disorder) 06/15/2023   Generalized anxiety disorder 06/15/2023   Panic disorder with agoraphobia 06/15/2023   Major depressive disorder,  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/2023   Bulimia nervosa 06/15/2023   Borderline personality disorder (HCC) 06/15/2023   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 06/15/2023   Chronic prescription benzodiazepine use 06/15/2023   History of alcohol use  disorder in sustained remission 06/15/2023   Polypharmacy 06/15/2023   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   Caffeine-induced insomnia with snoring 06/15/2023   Irregular bleeding 06/02/2023   Pelvic cramping 06/02/2023   Urinary frequency 06/02/2023   Pregnancy examination or test, negative result 06/02/2023   Missed periods 06/02/2023   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   Sebaceous cyst of labia 02/03/2023   Cyst of left ovary 08/11/2022   ASCUS with positive high risk HPV cervical 08/11/2022   Moody 07/30/2022   Hot flashes 07/30/2022   Friable cervix 07/30/2022   Spotting 07/30/2022   Amenorrhea, secondary 07/30/2022   Bartholin cyst 04/30/2022   Irregular periods 04/30/2022   Dental caries 03/11/2022   Tick bite of back 12/20/2020   Chronic bronchitis (HCC) 07/09/2020   Breast tenderness 09/20/2018   Gastroesophageal reflux disease 09/16/2018   Shortness of breath 08/05/2018   Abnormal finding on lung imaging 08/05/2018   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   Swollen lymph nodes 01/06/2017   RUQ pain 10/08/2016   LLQ pain 10/08/2016   Menstrual period late 10/08/2016   Cyst of right Bartholin's gland 12/17/2015   Recurrent boils 06/18/2015   Type 2 diabetes mellitus with complication, without long-term current use of insulin  (HCC) 04/18/2015   Ingrowing toenail 04/18/2015   Vitamin D  deficiency 04/18/2015   Angular cheilitis 04/18/2015   Dyslipidemia 04/18/2015   Diabetes mellitus with complication in adult patient (HCC) 02/20/2015   Smoker 02/20/2015   Essential hypertension 02/20/2015   Pain in limb 02/20/2015   Myalgia 02/20/2015   Hypothyroidism 02/20/2015   Leg pain, inferior 02/20/2015   Toenail deformity 02/20/2015   Elevated liver enzymes 01/07/2012   Abnormal CT of liver 09/30/2011   Fatty liver 09/30/2011   G E REFLUX 11/30/2007   CHEST PAIN-UNSPECIFIED 11/30/2007   HEADACHE 10/21/2007   DYSPNEA 09/15/2007   UNSPECIFIED TACHYCARDIA 06/01/2007   COUGH 05/24/2007    TOBACCO ABUSE 05/20/2007   Pulmonary nodules 05/12/2007   DEPRESSION 04/19/2007   Asthma 04/19/2007    Allergies:  Allergies  Allergen Reactions   Bactrim  [Sulfamethoxazole -Trimethoprim ] Anaphylaxis and Other (See Comments)    Chest pain, trouble breathing   Ciprofloxacin Palpitations   Clindamycin /Lincomycin     Other reaction(s): Chest Pain   Flagyl  [Metronidazole ] Shortness Of Breath   Hydrocodone Other (See Comments)   Iodinated Contrast Media Swelling   Ondansetron Shortness Of Breath   Liraglutide  Nausea And Vomiting   Doxycycline  Nausea And Vomiting   Penicillins    Synjardy  Xr [Empagliflozin-Metformin  Hcl Er]    Medications:  Current Outpatient Medications:    azithromycin  (ZITHROMAX ) 250 MG tablet, Take 2 tablets on day 1, then 1 tablet daily on days 2 through 5, Disp: 6 tablet, Rfl: 0   promethazine -dextromethorphan (PROMETHAZINE -DM) 6.25-15 MG/5ML syrup, Take 5 mLs by mouth 4 (four) times daily as needed., Disp: 118 mL, Rfl: 0   ACCU-CHEK AVIVA PLUS test strip, 1 each by Other route 3 (three) times daily., Disp: , Rfl:    ACCU-CHEK FASTCLIX LANCETS MISC, USE AS DIRECTED, Disp: 102 each, Rfl: 3   albuterol  (VENTOLIN  HFA) 108 (90 Base) MCG/ACT inhaler, Inhale 2 puffs into the lungs every 6 (six) hours as needed for wheezing or shortness of breath., Disp: 18 g, Rfl: 3  amLODipine  (NORVASC ) 5 MG tablet, TAKE 1 TABLET (5 MG TOTAL) BY MOUTH DAILY., Disp: 90 tablet, Rfl: 3   benzonatate  (TESSALON ) 100 MG capsule, Take 1 capsule (100 mg total) by mouth 3 (three) times daily as needed for cough., Disp: 30 capsule, Rfl: 0   Blood Glucose Monitoring Suppl (ACCU-CHEK AVIVA PLUS) w/Device KIT, USE AS DIRECTED, Disp: 1 kit, Rfl: 0   Blood Glucose Monitoring Suppl DEVI, 1 each by Does not apply route in the morning, at noon, and at bedtime. May substitute to any manufacturer covered by patient's insurance., Disp: 1 each, Rfl: 0   clonazePAM (KLONOPIN) 1 MG tablet, Take 1 mg by mouth 2  (two) times daily., Disp: , Rfl:    Continuous Glucose Receiver (DEXCOM G7 RECEIVER) DEVI, 1 Device by Does not apply route continuous., Disp: 1 each, Rfl: 0   Continuous Glucose Sensor (DEXCOM G7 SENSOR) MISC, USE AS DIRECTED, Disp: 9 each, Rfl: 0   cyclobenzaprine  (FLEXERIL ) 10 MG tablet, Take 1 tablet (10 mg total) by mouth 3 (three) times daily as needed., Disp: 15 tablet, Rfl: 0   fluticasone  (FLONASE ) 50 MCG/ACT nasal spray, Place 2 sprays into both nostrils daily., Disp: 16 g, Rfl: 6   fluticasone -salmeterol (ADVAIR) 250-50 MCG/ACT AEPB, Inhale 1 puff into the lungs every 12 (twelve) hours. Rinse after use., Disp: 60 each, Rfl: 5   insulin  lispro (HUMALOG  KWIKPEN) 100 UNIT/ML KwikPen, Max daily 45 units, Disp: 30 mL, Rfl: 4   Insulin  Pen Needle 32G X 4 MM MISC, 1 Device by Does not apply route in the morning, at noon, in the evening, and at bedtime., Disp: 400 each, Rfl: 3   ipratropium (ATROVENT ) 0.03 % nasal spray, Place 2 sprays into both nostrils every 12 (twelve) hours., Disp: 30 mL, Rfl: 0   ipratropium-albuterol  (DUONEB) 0.5-2.5 (3) MG/3ML SOLN, Take 3 mLs by nebulization in the morning, at noon, and at bedtime., Disp: 270 mL, Rfl: 5   lamoTRIgine (LAMICTAL) 150 MG tablet, Take 150 mg by mouth daily., Disp: , Rfl:    levothyroxine  (SYNTHROID ) 75 MCG tablet, TAKE 1 TABLET BY MOUTH EVERY DAY BEFORE BREAKFAST, Disp: 90 tablet, Rfl: 0   metFORMIN  (GLUCOPHAGE -XR) 500 MG 24 hr tablet, Take 1 tablet (500 mg total) by mouth 2 (two) times daily with a meal., Disp: 60 tablet, Rfl: 5   montelukast  (SINGULAIR ) 10 MG tablet, TAKE 1 TABLET BY MOUTH EVERYDAY AT BEDTIME, Disp: 90 tablet, Rfl: 3   naproxen  (NAPROSYN ) 500 MG tablet, Take 1 tablet (500 mg total) by mouth 2 (two) times daily with a meal., Disp: 30 tablet, Rfl: 0   Nebulizers (COMPRESSOR/NEBULIZER) MISC, Use as directed, Disp: 1 each, Rfl: 0   nitroGLYCERIN  (NITROSTAT ) 0.4 MG SL tablet, Place 1 tablet (0.4 mg total) under the tongue every 5  (five) minutes as needed for chest pain., Disp: 25 tablet, Rfl: 3   nystatin  cream (MYCOSTATIN ), Apply to affected area 2 times daily, Disp: 30 g, Rfl: 0   omeprazole  (PRILOSEC) 40 MG capsule, Take 40 mg by mouth every morning., Disp: , Rfl:    PARoxetine (PAXIL) 10 MG tablet, Take 10 mg by mouth at bedtime., Disp: , Rfl:    promethazine  (PHENERGAN ) 12.5 MG tablet, Take 12.5 mg by mouth every 8 (eight) hours as needed for vomiting or nausea., Disp: , Rfl:    propranolol  (INDERAL ) 20 MG tablet, Take 1 tablet (20 mg total) by mouth 3 (three) times daily., Disp: 270 tablet, Rfl: 3   pseudoephedrine  (SUDAFED) 120  MG 12 hr tablet, Take 1 tablet (120 mg total) by mouth 2 (two) times daily., Disp: 20 tablet, Rfl: 0   Respiratory Therapy Supplies (NEBULIZER/TUBING/MOUTHPIECE) KIT, Use as directed, Disp: 10 kit, Rfl: 11   rosuvastatin  (CRESTOR ) 5 MG tablet, Take 1 tablet (5 mg total) by mouth daily., Disp: 90 tablet, Rfl: 1   solifenacin  (VESICARE ) 5 MG tablet, Take 1 tablet (5 mg total) by mouth daily., Disp: 30 tablet, Rfl: 2   Vitamin D , Ergocalciferol , (DRISDOL ) 1.25 MG (50000 UNIT) CAPS capsule, Take 50,000 Units by mouth every 7 (seven) days., Disp: , Rfl:    ziprasidone (GEODON) 60 MG capsule, Take 60 mg by mouth at bedtime., Disp: , Rfl:   Observations/Objective: Patient is well-developed, well-nourished in no acute distress.  Resting comfortably at home.  Head is normocephalic, atraumatic.  No labored breathing.  Speech is clear and coherent with logical content.  Patient is alert and oriented at baseline.    Assessment and Plan: 1. Acute bacterial bronchitis (Primary) - azithromycin  (ZITHROMAX ) 250 MG tablet; Take 2 tablets on day 1, then 1 tablet daily on days 2 through 5  Dispense: 6 tablet; Refill: 0 - promethazine -dextromethorphan (PROMETHAZINE -DM) 6.25-15 MG/5ML syrup; Take 5 mLs by mouth 4 (four) times daily as needed.  Dispense: 118 mL; Refill: 0  - Worsening over a week despite  OTC medications - Will treat with Z-pack and Promethazine  DM - Can add Mucinex (PLAIN) - Push fluids.  - Rest.  - Steam and humidifier can help - Seek in person evaluation if worsening or symptoms fail to improve    Follow Up Instructions: I discussed the assessment and treatment plan with the patient. The patient was provided an opportunity to ask questions and all were answered. The patient agreed with the plan and demonstrated an understanding of the instructions.  A copy of instructions were sent to the patient via MyChart unless otherwise noted below.    The patient was advised to call back or seek an in-person evaluation if the symptoms worsen or if the condition fails to improve as anticipated.    [name], PA-C

## 2023-11-20 NOTE — Patient Instructions (Signed)
 Emily [name], thank you for joining Emily CHRISTELLA Dickinson, PA-C for today's virtual visit.  While this provider is not your primary care provider (PCP), if your PCP is located in our provider database this encounter information will be shared with them immediately following your visit.   A [facility] MyChart account gives you access to today's visit and all your visits, tests, and labs performed at [facility]  click here if you don't have a [facility] MyChart account or go to [email or web address]  Consent: (Patient) Emily [name] provided verbal consent for this virtual visit at the beginning of the encounter.  Current Medications:  Current Outpatient Medications:    azithromycin  (ZITHROMAX ) 250 MG tablet, Take 2 tablets on day 1, then 1 tablet daily on days 2 through 5, Disp: 6 tablet, Rfl: 0   promethazine -dextromethorphan (PROMETHAZINE -DM) 6.25-15 MG/5ML syrup, Take 5 mLs by mouth 4 (four) times daily as needed., Disp: 118 mL, Rfl: 0   ACCU-CHEK AVIVA PLUS test strip, 1 each by Other route 3 (three) times daily., Disp: , Rfl:    ACCU-CHEK FASTCLIX LANCETS MISC, USE AS DIRECTED, Disp: 102 each, Rfl: 3   albuterol  (VENTOLIN  HFA) 108 (90 Base) MCG/ACT inhaler, Inhale 2 puffs into the lungs every 6 (six) hours as needed for wheezing or shortness of breath., Disp: 18 g, Rfl: 3   amLODipine  (NORVASC ) 5 MG tablet, TAKE 1 TABLET (5 MG TOTAL) BY MOUTH DAILY., Disp: 90 tablet, Rfl: 3   benzonatate  (TESSALON ) 100 MG capsule, Take 1 capsule (100 mg total) by mouth 3 (three) times daily as needed for cough., Disp: 30 capsule, Rfl: 0   Blood Glucose Monitoring Suppl (ACCU-CHEK AVIVA PLUS) w/Device KIT, USE AS DIRECTED, Disp: 1 kit, Rfl: 0   Blood Glucose Monitoring Suppl DEVI, 1 each by Does not apply route in the morning, at noon, and at bedtime. May substitute to any manufacturer covered by patient's insurance., Disp: 1 each, Rfl: 0   clonazePAM (KLONOPIN) 1 MG tablet, Take 1  mg by mouth 2 (two) times daily., Disp: , Rfl:    Continuous Glucose Receiver (DEXCOM G7 RECEIVER) DEVI, 1 Device by Does not apply route continuous., Disp: 1 each, Rfl: 0   Continuous Glucose Sensor (DEXCOM G7 SENSOR) MISC, USE AS DIRECTED, Disp: 9 each, Rfl: 0   cyclobenzaprine  (FLEXERIL ) 10 MG tablet, Take 1 tablet (10 mg total) by mouth 3 (three) times daily as needed., Disp: 15 tablet, Rfl: 0   fluticasone  (FLONASE ) 50 MCG/ACT nasal spray, Place 2 sprays into both nostrils daily., Disp: 16 g, Rfl: 6   fluticasone -salmeterol (ADVAIR) 250-50 MCG/ACT AEPB, Inhale 1 puff into the lungs every 12 (twelve) hours. Rinse after use., Disp: 60 each, Rfl: 5   insulin  lispro (HUMALOG  KWIKPEN) 100 UNIT/ML KwikPen, Max daily 45 units, Disp: 30 mL, Rfl: 4   Insulin  Pen Needle 32G X 4 MM MISC, 1 Device by Does not apply route in the morning, at noon, in the evening, and at bedtime., Disp: 400 each, Rfl: 3   ipratropium (ATROVENT ) 0.03 % nasal spray, Place 2 sprays into both nostrils every 12 (twelve) hours., Disp: 30 mL, Rfl: 0   ipratropium-albuterol  (DUONEB) 0.5-2.5 (3) MG/3ML SOLN, Take 3 mLs by nebulization in the morning, at noon, and at bedtime., Disp: 270 mL, Rfl: 5   lamoTRIgine (LAMICTAL) 150 MG tablet, Take 150 mg by mouth daily., Disp: , Rfl:    levothyroxine  (SYNTHROID ) 75 MCG tablet, TAKE 1 TABLET BY MOUTH EVERY DAY BEFORE BREAKFAST, Disp:  90 tablet, Rfl: 0   metFORMIN  (GLUCOPHAGE -XR) 500 MG 24 hr tablet, Take 1 tablet (500 mg total) by mouth 2 (two) times daily with a meal., Disp: 60 tablet, Rfl: 5   montelukast  (SINGULAIR ) 10 MG tablet, TAKE 1 TABLET BY MOUTH EVERYDAY AT BEDTIME, Disp: 90 tablet, Rfl: 3   naproxen  (NAPROSYN ) 500 MG tablet, Take 1 tablet (500 mg total) by mouth 2 (two) times daily with a meal., Disp: 30 tablet, Rfl: 0   Nebulizers (COMPRESSOR/NEBULIZER) MISC, Use as directed, Disp: 1 each, Rfl: 0   nitroGLYCERIN  (NITROSTAT ) 0.4 MG SL tablet, Place 1 tablet (0.4 mg total) under the  tongue every 5 (five) minutes as needed for chest pain., Disp: 25 tablet, Rfl: 3   nystatin  cream (MYCOSTATIN ), Apply to affected area 2 times daily, Disp: 30 g, Rfl: 0   omeprazole  (PRILOSEC) 40 MG capsule, Take 40 mg by mouth every morning., Disp: , Rfl:    PARoxetine (PAXIL) 10 MG tablet, Take 10 mg by mouth at bedtime., Disp: , Rfl:    promethazine  (PHENERGAN ) 12.5 MG tablet, Take 12.5 mg by mouth every 8 (eight) hours as needed for vomiting or nausea., Disp: , Rfl:    propranolol  (INDERAL ) 20 MG tablet, Take 1 tablet (20 mg total) by mouth 3 (three) times daily., Disp: 270 tablet, Rfl: 3   pseudoephedrine  (SUDAFED) 120 MG 12 hr tablet, Take 1 tablet (120 mg total) by mouth 2 (two) times daily., Disp: 20 tablet, Rfl: 0   Respiratory Therapy Supplies (NEBULIZER/TUBING/MOUTHPIECE) KIT, Use as directed, Disp: 10 kit, Rfl: 11   rosuvastatin  (CRESTOR ) 5 MG tablet, Take 1 tablet (5 mg total) by mouth daily., Disp: 90 tablet, Rfl: 1   solifenacin  (VESICARE ) 5 MG tablet, Take 1 tablet (5 mg total) by mouth daily., Disp: 30 tablet, Rfl: 2   Vitamin D , Ergocalciferol , (DRISDOL ) 1.25 MG (50000 UNIT) CAPS capsule, Take 50,000 Units by mouth every 7 (seven) days., Disp: , Rfl:    ziprasidone (GEODON) 60 MG capsule, Take 60 mg by mouth at bedtime., Disp: , Rfl:    Medications ordered in this encounter:  Meds ordered this encounter  Medications   azithromycin  (ZITHROMAX ) 250 MG tablet    Sig: Take 2 tablets on day 1, then 1 tablet daily on days 2 through 5    Dispense:  6 tablet    Refill:  0    Supervising Provider:   LAMPTEY, PHILIP O [[id number]]   promethazine -dextromethorphan (PROMETHAZINE -DM) 6.25-15 MG/5ML syrup    Sig: Take 5 mLs by mouth 4 (four) times daily as needed.    Dispense:  118 mL    Refill:  0    Supervising Provider:   BLAISE ALEENE KIDD [[id number]]     *If you need refills on other medications prior to your next appointment, please contact your pharmacy*  Follow-Up: Call back or  seek an in-person evaluation if the symptoms worsen or if the condition fails to improve as anticipated.  [facility] [phone]  Other Instructions Cough, Adult Coughing is a reflex that clears your throat and airways (respiratory system). It helps heal and protect your lungs. It is normal to cough from time to time. A cough that happens with other symptoms or that lasts a long time may be a sign of a condition that needs treatment. A short-term (acute) cough may only last 2-3 weeks. A long-term (chronic) cough may last 8 or more weeks. Coughing is often caused by: Diseases, such as: An infection of  the respiratory system. Asthma or other heart or lung diseases. Gastroesophageal reflux. This is when acid comes back up from the stomach. Breathing in things that irritate your lungs. Allergies. Postnasal drip. This is when mucus runs down the back of your throat. Smoking. Some medicines. Follow these instructions at home: Medicines Take over-the-counter and prescription medicines only as told by your health care provider. Talk with your provider before you take cough medicine (cough suppressants). Eating and drinking Do not drink alcohol. Avoid caffeine. Drink enough fluid to keep your pee (urine) pale yellow. Lifestyle Avoid cigarette smoke. Do not use any products that contain nicotine  or tobacco. These products include cigarettes, chewing tobacco, and vaping devices, such as e-cigarettes. If you need help quitting, ask your provider. Avoid things that make you cough. These may include perfumes, candles, cleaning products, or campfire smoke. General instructions  Watch for any changes to your cough. Tell your provider about them. Always cover your mouth when you cough. If the air is dry in your bedroom or home, use a cool mist vaporizer or humidifier. If your cough is worse at night, try to sleep in a semi-upright position. Rest as needed. Contact a health care  provider if: You have new symptoms, or your symptoms get worse. You cough up pus. You have a fever that does not go away or a cough that does not get better after 2-3 weeks. You cannot control your cough with medicine, and you are losing sleep. You have pain that gets worse or is not helped with medicine. You lose weight for no clear reason. You have night sweats. Get help right away if: You cough up blood. You have trouble breathing. Your heart is beating very fast. These symptoms may be an emergency. Get help right away. Call 911. Do not wait to see if the symptoms will go away. Do not drive yourself to the hospital. This information is not intended to replace advice given to you by your health care provider. Make sure you discuss any questions you have with your health care provider. Document Revised: 12/06/2021 Document Reviewed: 12/06/2021 Elsevier Patient Education  2024 [employer]   If you have been instructed to have an in-person evaluation today at a local Urgent Care facility, please use the link below. It will take you to a list of all of our available [facility], including address, phone number and hours of operation. Please do not delay care.  [facility]  If you or a family member do not have a primary care provider, use the link below to schedule a visit and establish care. When you choose a [facility] primary care physician or advanced practice provider, you gain a long-term partner in health. Find a Primary Care Provider  Learn more about [facility]'s in-office and virtual care options:  - Get Care Now

## 2023-11-20 NOTE — Telephone Encounter (Signed)
 The report is confusing it says something is there in the lower lungs such as atelectasis but the final report does not say anything of that.  I am not able to visualize the images but based on the report I suspect these findings are reassuring and not clinically significant.  If she has any concerns she should ask the clinician there to visualize the imaging explained to her

## 2023-11-21 LAB — URINE CULTURE

## 2023-11-23 ENCOUNTER — Ambulatory Visit: Payer: Self-pay | Admitting: Adult Health

## 2023-11-25 DIAGNOSIS — R928 Other abnormal and inconclusive findings on diagnostic imaging of breast: Secondary | ICD-10-CM | POA: Diagnosis not present

## 2023-11-25 DIAGNOSIS — N644 Mastodynia: Secondary | ICD-10-CM | POA: Diagnosis not present

## 2023-11-25 DIAGNOSIS — N63 Unspecified lump in unspecified breast: Secondary | ICD-10-CM | POA: Diagnosis not present

## 2023-11-25 DIAGNOSIS — R92323 Mammographic fibroglandular density, bilateral breasts: Secondary | ICD-10-CM | POA: Diagnosis not present

## 2023-11-27 ENCOUNTER — Encounter: Payer: Self-pay | Admitting: Family Medicine

## 2023-11-27 ENCOUNTER — Ambulatory Visit: Payer: Self-pay

## 2023-11-27 ENCOUNTER — Ambulatory Visit: Admitting: Medical

## 2023-11-27 DIAGNOSIS — M25472 Effusion, left ankle: Secondary | ICD-10-CM | POA: Diagnosis not present

## 2023-11-27 DIAGNOSIS — M25471 Effusion, right ankle: Secondary | ICD-10-CM | POA: Diagnosis not present

## 2023-11-27 DIAGNOSIS — M546 Pain in thoracic spine: Secondary | ICD-10-CM | POA: Diagnosis not present

## 2023-11-27 NOTE — Telephone Encounter (Signed)
 [name]?: Action required by provider: request for appointment and update on patient condition.  Patient was last seen in primary care on 10/29/2023 by Moishe Chiquita HERO, NP.  Called Nurse Triage reporting Leg Swelling and Back Pain.  Symptoms began several days ago.  Interventions attempted: OTC medications: ibuprofen.  Symptoms are: gradually worsening.  Triage Disposition: See Physician Within 24 Hours  Patient/caregiver understands and will follow disposition?: No, refuses disposition                             Copied from CRM [id number]. Topic: Clinical - Red Word Triage >> Nov 27, 2023 12:49 PM Deaijah H wrote: Red Word that prompted transfer to Nurse Triage: Left lower leg/ankle swollen and pain in middle of back Reason for Disposition  Blood in urine (red, pink, or tea-colored)  Answer Assessment - Initial Assessment Questions 1. ONSET: When did the swelling start? (e.g., minutes, hours, days)     A couple of days ago, worsening 2. LOCATION: What part of the leg is swollen?  Are both legs swollen or just one leg?     Left foot and ankle 3. SEVERITY: How bad is the swelling? (e.g., localized; mild, moderate, severe)     Localized 4. REDNESS: Is there redness or signs of infection?     Denies 5. PAIN: Is the swelling painful to touch? If Yes, ask: How painful is it?   (Scale 1-10; mild, moderate or severe)     Denies pain at this time, states entire leg was hurting last night  6. FEVER: Do you have a fever? If Yes, ask: What is it, how was it measured, and when did it start?      Denies 7. CAUSE: What do you think is causing the leg swelling?     Unsure 8. MEDICAL HISTORY: Do you have a history of blood clots (e.g., DVT), cancer, heart failure, kidney disease, or liver failure?     Psoriasis of liver 9. RECURRENT SYMPTOM: Have you had leg swelling before? If Yes, ask: When was the last time? What  happened that time?     Denies 10. OTHER SYMPTOMS: Do you have any other symptoms? (e.g., chest pain, difficulty breathing)     Denies difficulty breathing, denies chest pain, denies weakness  Answer Assessment - Initial Assessment Questions 1. ONSET: When did the pain begin? (e.g., minutes, hours, days)     This morning 2. LOCATION: Where does it hurt? (upper, mid or lower back)     Middle of back below bra line  3. SEVERITY: How bad is the pain?  (e.g., Scale 1-10; mild, moderate, or severe)     Mild, rates pain a 3  4. PATTERN: Is the pain constant? (e.g., yes, no; constant, intermittent)      Constant  5. RADIATION: Does the pain shoot into your legs or somewhere else?     States pain is localized to back 6. CAUSE:  What do you think is causing the back pain?      Unsure  7. BACK OVERUSE:  Any recent lifting of heavy objects, strenuous work or exercise?     Denies 8. MEDICINES: What have you taken so far for the pain? (e.g., nothing, acetaminophen, NSAIDS)     Ibuprofen  9. NEUROLOGIC SYMPTOMS: Do you have any weakness, numbness, or problems with bowel/bladder control?     Denies 10. OTHER SYMPTOMS: Do you have any other  symptoms? (e.g., fever, abdomen pain, burning with urination, blood in urine)     Hematuria, urinary frequency, denies abdominal pain, denies painful urination    This RN advised patient to be seen within 24 hours. No availability in office. This RN called [facility] for assistance. No availability at other office locations within a reasonable drive for patient. Advised UC. Patient declined and stated she only wants to be seen at her PCP office. Please advise.  Protocols used: Leg Swelling and Edema-A-AH, Back Pain-A-AH

## 2023-11-27 NOTE — Telephone Encounter (Signed)
 [name]?: Action required by provider: request for appointment. Offered appointment in [facility] office, but pt. Declines.  Patient was last seen in primary care on 10/29/2023 by Moishe Chiquita HERO, NP.  Called Nurse Triage reporting Joint Swelling.  Symptoms began several days ago.  Interventions attempted: Nothing.  Symptoms are: unchanged.  Triage Disposition: See PCP When Office is Open (Within 3 Days)  Patient/caregiver understands and will follow disposition?:    Copied from CRM #[id number]. Topic: Clinical - Red Word Triage >> Nov 27, 2023  1:17 PM Mercedes MATSU wrote: Red Word that prompted transfer to Nurse Triage: Patient is calling in because her left ankle is swollen, and back pain. Patient disconnected before I could transfer her to a nurse. Reason for Disposition  MODERATE ankle swelling (e.g., interferes with normal activities, can't move joint normally) (Exceptions: Itchy, localized swelling; swelling is chronic.)  Answer Assessment - Initial Assessment Questions 1. LOCATION: Which ankle is swollen? Where is the swelling?     Left ankle 2. ONSET: When did the swelling start?     2 days 3. SWELLING: How bad is the swelling? Or, How large is it? (e.g., mild, moderate, severe; size of localized swelling)      moderate 4. PAIN: Is there any pain? If Yes, ask: How bad is it? (Scale 0-10; or none, mild, moderate, severe)     no 5. CAUSE: What do you think caused the ankle swelling?     unsure 6. OTHER SYMPTOMS: Do you have any other symptoms? (e.g., fever, chest pain, difficulty breathing, calf pain)     Back pain - middle of back 7. PREGNANCY: Is there any chance you are pregnant? When was your last menstrual period?     no  Protocols used: Ankle Swelling-A-AH

## 2023-11-30 ENCOUNTER — Ambulatory Visit: Admitting: Family Medicine

## 2023-11-30 ENCOUNTER — Telehealth: Admitting: Physician Assistant

## 2023-11-30 DIAGNOSIS — B9689 Other specified bacterial agents as the cause of diseases classified elsewhere: Secondary | ICD-10-CM

## 2023-11-30 DIAGNOSIS — J411 Mucopurulent chronic bronchitis: Secondary | ICD-10-CM | POA: Diagnosis not present

## 2023-11-30 DIAGNOSIS — J069 Acute upper respiratory infection, unspecified: Secondary | ICD-10-CM | POA: Diagnosis not present

## 2023-11-30 MED ORDER — AZITHROMYCIN 250 MG PO TABS: ORAL_TABLET | ORAL | 0 refills | Status: DC

## 2023-11-30 NOTE — Progress Notes (Signed)
 Virtual Visit Consent   Emily [name], you are scheduled for a virtual visit with a [facility] provider today. Just as with appointments in the office, your consent must be obtained to participate. Your consent will be active for this visit and any virtual visit you may have with one of our providers in the next 365 days. If you have a MyChart account, a copy of this consent can be sent to you electronically.  As this is a virtual visit, video technology does not allow for your provider to perform a traditional examination. This may limit your provider's ability to fully assess your condition. If your provider identifies any concerns that need to be evaluated in person or the need to arrange testing (such as labs, EKG, etc.), we will make arrangements to do so. Although advances in technology are sophisticated, we cannot ensure that it will always work on either your end or our end. If the connection with a video visit is poor, the visit may have to be switched to a telephone visit. With either a video or telephone visit, we are not always able to ensure that we have a secure connection.  By engaging in this virtual visit, you consent to the provision of healthcare and authorize for your insurance to be billed (if applicable) for the services provided during this visit. Depending on your insurance coverage, you may receive a charge related to this service.  I need to obtain your verbal consent now. Are you willing to proceed with your visit today? Elspeth L Gallery has provided verbal consent on 11/30/2023 for a virtual visit (video or telephone). [name], PA-C  Date: 11/30/2023 5:20 PM   Virtual Visit via Video Note   IDelon CHRISTELLA Dickinson, connected with  Emily [name]  ([id number], 43) on 11/30/23 at  5:15 PM EDT by a video-enabled telemedicine application and verified that I am speaking with the correct person using two identifiers.  Location: Patient: Virtual Visit  Location Patient: Home Provider: Virtual Visit Location Provider: Home Office   I discussed the limitations of evaluation and management by telemedicine and the availability of in person appointments. The patient expressed understanding and agreed to proceed.    History of Present Illness: Emily [name] is a 43 y.o. who identifies as a female who was assigned female at birth, and is being seen today for cough, sore throat.  HPI: URI  This is a recurrent problem. The current episode started in the past 7 days (Seen for similar symptoms on 11/20/23 and treated with Azithromycin  and Promethazine  DM for bacterial bronchitis; reports complete resolution of symptoms). The problem has been gradually worsening. There has been no fever. Associated symptoms include congestion, coughing, headaches, a plugged ear sensation, rhinorrhea, sinus pain and a sore throat. Pertinent negatives include no ear pain. She has tried acetaminophen  and increased fluids for the symptoms. The treatment provided no relief.   Patient reports having sick contacts at home with everyone in the house is sick. She reports they have been tested for Flu, Covid, and RSV and all is negative. Her sister has been sick for 2 weeks. The patient's symptoms started about 4 days ago.   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/2023   Bulimia nervosa 06/15/2023   Borderline personality disorder (HCC)  06/15/2023   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 06/15/2023   Chronic prescription benzodiazepine use 06/15/2023   History of alcohol use disorder in sustained remission 06/15/2023   Polypharmacy 06/15/2023   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   Caffeine-induced insomnia with snoring 06/15/2023   Irregular bleeding 06/02/2023   Pelvic cramping 06/02/2023   Urinary frequency 06/02/2023   Pregnancy examination or test, negative result 06/02/2023   Missed periods 06/02/2023   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   Sebaceous cyst of labia 02/03/2023   Cyst of left ovary 08/11/2022   ASCUS with positive high [name] HPV cervical 08/11/2022   Moody 07/30/2022   Hot flashes 07/30/2022   Friable cervix 07/30/2022   Spotting 07/30/2022   Amenorrhea, secondary 07/30/2022   Bartholin cyst 04/30/2022   Irregular periods 04/30/2022   Dental caries 03/11/2022   Tick bite of back 12/20/2020   Chronic bronchitis (HCC) 07/09/2020   Breast tenderness 09/20/2018   Gastroesophageal reflux disease 09/16/2018   Shortness of breath 08/05/2018   Abnormal finding on lung imaging 08/05/2018   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   Swollen lymph nodes 01/06/2017   RUQ pain 10/08/2016   LLQ pain 10/08/2016   Menstrual period late 10/08/2016   Cyst of right Bartholin's gland 12/17/2015   Recurrent boils 06/18/2015   Type 2 diabetes mellitus with complication, without long-term current use of insulin  (HCC) 04/18/2015   Ingrowing toenail 04/18/2015   Vitamin D  deficiency 04/18/2015   Angular cheilitis 04/18/2015   Dyslipidemia 04/18/2015   Diabetes mellitus with complication in adult patient (HCC) 02/20/2015   Smoker 02/20/2015   Essential hypertension 02/20/2015   Pain in limb 02/20/2015   Myalgia 02/20/2015   Hypothyroidism 02/20/2015   Leg pain, inferior 02/20/2015   Toenail deformity 02/20/2015   Elevated liver enzymes 01/07/2012   Abnormal CT of liver 09/30/2011   Fatty liver 09/30/2011   G E  REFLUX 11/30/2007   CHEST PAIN-UNSPECIFIED 11/30/2007   HEADACHE 10/21/2007   DYSPNEA 09/15/2007   UNSPECIFIED TACHYCARDIA 06/01/2007   COUGH 05/24/2007   TOBACCO ABUSE 05/20/2007   Pulmonary nodules 05/12/2007   DEPRESSION 04/19/2007   Asthma 04/19/2007    Allergies:  Allergies  Allergen Reactions   Bactrim  [Sulfamethoxazole -Trimethoprim ] Anaphylaxis and Other (See Comments)    Chest pain, trouble breathing   Ciprofloxacin Palpitations   Clindamycin /Lincomycin     Other reaction(s): Chest Pain   Flagyl  [Metronidazole ] Shortness Of Breath   Hydrocodone Other (See Comments)   Iodinated Contrast Media Swelling   Ondansetron Shortness Of Breath   Liraglutide  Nausea And Vomiting   Doxycycline  Nausea And Vomiting   Penicillins    Synjardy  Xr [Empagliflozin-Metformin  Hcl Er]    Medications:  Current Outpatient Medications:    azithromycin  (ZITHROMAX ) 250 MG tablet, Take 2 tablets on day 1, then 1 tablet daily on days 2 through 5, Disp: 6 tablet, Rfl: 0   ACCU-CHEK AVIVA PLUS test strip, 1 each by Other route 3 (three) times daily., Disp: , Rfl:    ACCU-CHEK FASTCLIX LANCETS MISC, USE AS DIRECTED, Disp: 102 each, Rfl: 3   albuterol  (VENTOLIN  HFA) 108 (90 Base) MCG/ACT inhaler, Inhale 2 puffs into the lungs every 6 (six) hours as needed for wheezing  or shortness of breath., Disp: 18 g, Rfl: 3   amLODipine  (NORVASC ) 5 MG tablet, TAKE 1 TABLET (5 MG TOTAL) BY MOUTH DAILY., Disp: 90 tablet, Rfl: 3   benzonatate  (TESSALON ) 100 MG capsule, Take 1 capsule (100 mg total) by mouth 3 (three) times daily as needed for cough., Disp: 30 capsule, Rfl: 0   Blood Glucose Monitoring Suppl (ACCU-CHEK AVIVA PLUS) w/Device KIT, USE AS DIRECTED, Disp: 1 kit, Rfl: 0   Blood Glucose Monitoring Suppl DEVI, 1 each by Does not apply route in the morning, at noon, and at bedtime. May substitute to any manufacturer covered by patient's insurance., Disp: 1 each, Rfl: 0   clonazePAM  (KLONOPIN ) 1 MG tablet, Take 1  mg by mouth 2 (two) times daily., Disp: , Rfl:    Continuous Glucose Receiver (DEXCOM G7 RECEIVER) DEVI, 1 Device by Does not apply route continuous., Disp: 1 each, Rfl: 0   Continuous Glucose Sensor (DEXCOM G7 SENSOR) MISC, USE AS DIRECTED, Disp: 9 each, Rfl: 0   cyclobenzaprine  (FLEXERIL ) 10 MG tablet, Take 1 tablet (10 mg total) by mouth 3 (three) times daily as needed., Disp: 15 tablet, Rfl: 0   fluticasone  (FLONASE ) 50 MCG/ACT nasal spray, Place 2 sprays into both nostrils daily., Disp: 16 g, Rfl: 6   fluticasone -salmeterol (ADVAIR) 250-50 MCG/ACT AEPB, Inhale 1 puff into the lungs every 12 (twelve) hours. Rinse after use., Disp: 60 each, Rfl: 5   insulin  lispro (HUMALOG  KWIKPEN) 100 UNIT/ML KwikPen, Max daily 45 units, Disp: 30 mL, Rfl: 4   Insulin  Pen Needle 32G X 4 MM MISC, 1 Device by Does not apply route in the morning, at noon, in the evening, and at bedtime., Disp: 400 each, Rfl: 3   ipratropium (ATROVENT ) 0.03 % nasal spray, Place 2 sprays into both nostrils every 12 (twelve) hours., Disp: 30 mL, Rfl: 0   ipratropium-albuterol  (DUONEB) 0.5-2.5 (3) MG/3ML SOLN, Take 3 mLs by nebulization in the morning, at noon, and at bedtime., Disp: 270 mL, Rfl: 5   lamoTRIgine  (LAMICTAL ) 150 MG tablet, Take 150 mg by mouth daily., Disp: , Rfl:    levothyroxine  (SYNTHROID ) 75 MCG tablet, TAKE 1 TABLET BY MOUTH EVERY DAY BEFORE BREAKFAST, Disp: 90 tablet, Rfl: 0   metFORMIN  (GLUCOPHAGE -XR) 500 MG 24 hr tablet, Take 1 tablet (500 mg total) by mouth 2 (two) times daily with a meal., Disp: 60 tablet, Rfl: 5   montelukast  (SINGULAIR ) 10 MG tablet, TAKE 1 TABLET BY MOUTH EVERYDAY AT BEDTIME, Disp: 90 tablet, Rfl: 3   naproxen  (NAPROSYN ) 500 MG tablet, Take 1 tablet (500 mg total) by mouth 2 (two) times daily with a meal., Disp: 30 tablet, Rfl: 0   Nebulizers (COMPRESSOR/NEBULIZER) MISC, Use as directed, Disp: 1 each, Rfl: 0   nitroGLYCERIN  (NITROSTAT ) 0.4 MG SL tablet, Place 1 tablet (0.4 mg total) under the  tongue every 5 (five) minutes as needed for chest pain., Disp: 25 tablet, Rfl: 3   nystatin  cream (MYCOSTATIN ), Apply to affected area 2 times daily, Disp: 30 g, Rfl: 0   omeprazole  (PRILOSEC) 40 MG capsule, Take 40 mg by mouth every morning., Disp: , Rfl:    PARoxetine  (PAXIL ) 10 MG tablet, Take 10 mg by mouth at bedtime., Disp: , Rfl:    promethazine  (PHENERGAN ) 12.5 MG tablet, Take 12.5 mg by mouth every 8 (eight) hours as needed for vomiting or nausea., Disp: , Rfl:    promethazine -dextromethorphan  (PROMETHAZINE -DM) 6.25-15 MG/5ML syrup, Take 5 mLs by mouth 4 (four) times daily as needed.,  Disp: 118 mL, Rfl: 0   propranolol  (INDERAL ) 20 MG tablet, Take 1 tablet (20 mg total) by mouth 3 (three) times daily., Disp: 270 tablet, Rfl: 3   pseudoephedrine  (SUDAFED) 120 MG 12 hr tablet, Take 1 tablet (120 mg total) by mouth 2 (two) times daily., Disp: 20 tablet, Rfl: 0   Respiratory Therapy Supplies (NEBULIZER/TUBING/MOUTHPIECE) KIT, Use as directed, Disp: 10 kit, Rfl: 11   rosuvastatin  (CRESTOR ) 5 MG tablet, Take 1 tablet (5 mg total) by mouth daily., Disp: 90 tablet, Rfl: 1   solifenacin  (VESICARE ) 5 MG tablet, Take 1 tablet (5 mg total) by mouth daily., Disp: 30 tablet, Rfl: 2   Vitamin D , Ergocalciferol , (DRISDOL ) 1.25 MG (50000 UNIT) CAPS capsule, Take 50,000 Units by mouth every 7 (seven) days., Disp: , Rfl:    ziprasidone  (GEODON ) 60 MG capsule, Take 60 mg by mouth at bedtime., Disp: , Rfl:   Observations/Objective: Patient is well-developed, well-nourished in no acute distress.  Resting comfortably at home.  Head is normocephalic, atraumatic.  No labored breathing.  Speech is clear and coherent with logical content.  Patient is alert and oriented at baseline.    Assessment and Plan: 1. Bacterial upper respiratory infection (Primary) - azithromycin  (ZITHROMAX ) 250 MG tablet; Take 2 tablets on day 1, then 1 tablet daily on days 2 through 5  Dispense: 6 tablet; Refill: 0  2. Mucopurulent  chronic bronchitis (HCC)  - Secondary symptoms worsening following previous treatment with Zpack from 11/20/23 - 11/24/23 where she had complete resolution of symptoms for about 4 days prior to new symptoms following a sick contact - Will treat with Z-pack again as this is only antibiotic patient feels comfortable to take - Discussed possibly viral, but with comorbidities of chronic bronchitis and lung nodules in an every day smoker, it is reasonable to consider antibiotic therapy as she would be considered high [name] for bacterial lung illnesses - Can add Mucinex   - Push fluids.  - Rest.  - Steam and humidifier can help - Seek in person evaluation if worsening or symptoms fail to improve    Follow Up Instructions: I discussed the assessment and treatment plan with the patient. The patient was provided an opportunity to ask questions and all were answered. The patient agreed with the plan and demonstrated an understanding of the instructions.  A copy of instructions were sent to the patient via MyChart unless otherwise noted below.    The patient was advised to call back or seek an in-person evaluation if the symptoms worsen or if the condition fails to improve as anticipated.    [name], PA-C

## 2023-11-30 NOTE — Patient Instructions (Signed)
 Emily [name], thank you for joining Emily CHRISTELLA Dickinson, PA-C for today's virtual visit.  While this provider is not your primary care provider (PCP), if your PCP is located in our provider database this encounter information will be shared with them immediately following your visit.   A [facility] MyChart account gives you access to today's visit and all your visits, tests, and labs performed at [facility]  click here if you don't have a [facility] MyChart account or go to [email or web address]  Consent: (Patient) Emily [name] provided verbal consent for this virtual visit at the beginning of the encounter.  Current Medications:  Current Outpatient Medications:    azithromycin  (ZITHROMAX ) 250 MG tablet, Take 2 tablets on day 1, then 1 tablet daily on days 2 through 5, Disp: 6 tablet, Rfl: 0   ACCU-CHEK AVIVA PLUS test strip, 1 each by Other route 3 (three) times daily., Disp: , Rfl:    ACCU-CHEK FASTCLIX LANCETS MISC, USE AS DIRECTED, Disp: 102 each, Rfl: 3   albuterol  (VENTOLIN  HFA) 108 (90 Base) MCG/ACT inhaler, Inhale 2 puffs into the lungs every 6 (six) hours as needed for wheezing or shortness of breath., Disp: 18 g, Rfl: 3   amLODipine  (NORVASC ) 5 MG tablet, TAKE 1 TABLET (5 MG TOTAL) BY MOUTH DAILY., Disp: 90 tablet, Rfl: 3   benzonatate  (TESSALON ) 100 MG capsule, Take 1 capsule (100 mg total) by mouth 3 (three) times daily as needed for cough., Disp: 30 capsule, Rfl: 0   Blood Glucose Monitoring Suppl (ACCU-CHEK AVIVA PLUS) w/Device KIT, USE AS DIRECTED, Disp: 1 kit, Rfl: 0   Blood Glucose Monitoring Suppl DEVI, 1 each by Does not apply route in the morning, at noon, and at bedtime. May substitute to any manufacturer covered by patient's insurance., Disp: 1 each, Rfl: 0   clonazePAM  (KLONOPIN ) 1 MG tablet, Take 1 mg by mouth 2 (two) times daily., Disp: , Rfl:    Continuous Glucose Receiver (DEXCOM G7 RECEIVER) DEVI, 1 Device by Does not apply route  continuous., Disp: 1 each, Rfl: 0   Continuous Glucose Sensor (DEXCOM G7 SENSOR) MISC, USE AS DIRECTED, Disp: 9 each, Rfl: 0   cyclobenzaprine  (FLEXERIL ) 10 MG tablet, Take 1 tablet (10 mg total) by mouth 3 (three) times daily as needed., Disp: 15 tablet, Rfl: 0   fluticasone  (FLONASE ) 50 MCG/ACT nasal spray, Place 2 sprays into both nostrils daily., Disp: 16 g, Rfl: 6   fluticasone -salmeterol (ADVAIR) 250-50 MCG/ACT AEPB, Inhale 1 puff into the lungs every 12 (twelve) hours. Rinse after use., Disp: 60 each, Rfl: 5   insulin  lispro (HUMALOG  KWIKPEN) 100 UNIT/ML KwikPen, Max daily 45 units, Disp: 30 mL, Rfl: 4   Insulin  Pen Needle 32G X 4 MM MISC, 1 Device by Does not apply route in the morning, at noon, in the evening, and at bedtime., Disp: 400 each, Rfl: 3   ipratropium (ATROVENT ) 0.03 % nasal spray, Place 2 sprays into both nostrils every 12 (twelve) hours., Disp: 30 mL, Rfl: 0   ipratropium-albuterol  (DUONEB) 0.5-2.5 (3) MG/3ML SOLN, Take 3 mLs by nebulization in the morning, at noon, and at bedtime., Disp: 270 mL, Rfl: 5   lamoTRIgine  (LAMICTAL ) 150 MG tablet, Take 150 mg by mouth daily., Disp: , Rfl:    levothyroxine  (SYNTHROID ) 75 MCG tablet, TAKE 1 TABLET BY MOUTH EVERY DAY BEFORE BREAKFAST, Disp: 90 tablet, Rfl: 0   metFORMIN  (GLUCOPHAGE -XR) 500 MG 24 hr tablet, Take 1 tablet (500 mg total) by mouth 2 (two)  times daily with a meal., Disp: 60 tablet, Rfl: 5   montelukast  (SINGULAIR ) 10 MG tablet, TAKE 1 TABLET BY MOUTH EVERYDAY AT BEDTIME, Disp: 90 tablet, Rfl: 3   naproxen  (NAPROSYN ) 500 MG tablet, Take 1 tablet (500 mg total) by mouth 2 (two) times daily with a meal., Disp: 30 tablet, Rfl: 0   Nebulizers (COMPRESSOR/NEBULIZER) MISC, Use as directed, Disp: 1 each, Rfl: 0   nitroGLYCERIN  (NITROSTAT ) 0.4 MG SL tablet, Place 1 tablet (0.4 mg total) under the tongue every 5 (five) minutes as needed for chest pain., Disp: 25 tablet, Rfl: 3   nystatin  cream (MYCOSTATIN ), Apply to affected area 2  times daily, Disp: 30 g, Rfl: 0   omeprazole  (PRILOSEC) 40 MG capsule, Take 40 mg by mouth every morning., Disp: , Rfl:    PARoxetine  (PAXIL ) 10 MG tablet, Take 10 mg by mouth at bedtime., Disp: , Rfl:    promethazine  (PHENERGAN ) 12.5 MG tablet, Take 12.5 mg by mouth every 8 (eight) hours as needed for vomiting or nausea., Disp: , Rfl:    promethazine -dextromethorphan  (PROMETHAZINE -DM) 6.25-15 MG/5ML syrup, Take 5 mLs by mouth 4 (four) times daily as needed., Disp: 118 mL, Rfl: 0   propranolol  (INDERAL ) 20 MG tablet, Take 1 tablet (20 mg total) by mouth 3 (three) times daily., Disp: 270 tablet, Rfl: 3   pseudoephedrine  (SUDAFED) 120 MG 12 hr tablet, Take 1 tablet (120 mg total) by mouth 2 (two) times daily., Disp: 20 tablet, Rfl: 0   Respiratory Therapy Supplies (NEBULIZER/TUBING/MOUTHPIECE) KIT, Use as directed, Disp: 10 kit, Rfl: 11   rosuvastatin  (CRESTOR ) 5 MG tablet, Take 1 tablet (5 mg total) by mouth daily., Disp: 90 tablet, Rfl: 1   solifenacin  (VESICARE ) 5 MG tablet, Take 1 tablet (5 mg total) by mouth daily., Disp: 30 tablet, Rfl: 2   Vitamin D , Ergocalciferol , (DRISDOL ) 1.25 MG (50000 UNIT) CAPS capsule, Take 50,000 Units by mouth every 7 (seven) days., Disp: , Rfl:    ziprasidone  (GEODON ) 60 MG capsule, Take 60 mg by mouth at bedtime., Disp: , Rfl:    Medications ordered in this encounter:  Meds ordered this encounter  Medications   azithromycin  (ZITHROMAX ) 250 MG tablet    Sig: Take 2 tablets on day 1, then 1 tablet daily on days 2 through 5    Dispense:  6 tablet    Refill:  0    Supervising Provider:   LAMPTEY, PHILIP O [phone]     *If you need refills on other medications prior to your next appointment, please contact your pharmacy*  Follow-Up: Call back or seek an in-person evaluation if the symptoms worsen or if the condition fails to improve as anticipated.  [facility] [phone]  Other Instructions Upper Respiratory Infection, Adult An upper  respiratory infection (URI) is a common viral infection of the nose, throat, and upper air passages that lead to the lungs. The most common type of URI is the common cold. URIs usually get better on their own, without medical treatment. What are the causes? A URI is caused by a virus. You may catch a virus by: Breathing in droplets from an infected person's cough or sneeze. Touching something that has been exposed to the virus (is contaminated) and then touching your mouth, nose, or eyes. What increases the risk? You are more likely to get a URI if: You are very young or very old. You have close contact with others, such as at work, school, or a health care facility.  You smoke. You have long-term (chronic) heart or lung disease. You have a weakened disease-fighting system (immune system). You have nasal allergies or asthma. You are experiencing a lot of stress. You have poor nutrition. What are the signs or symptoms? A URI usually involves some of the following symptoms: Runny or stuffy (congested) nose. Cough. Sneezing. Sore throat. Headache. Fatigue. Fever. Loss of appetite. Pain in your forehead, behind your eyes, and over your cheekbones (sinus pain). Muscle aches. Redness or irritation of the eyes. Pressure in the ears or face. How is this diagnosed? This condition may be diagnosed based on your medical history and symptoms, and a physical exam. Your health care provider may use a swab to take a mucus sample from your nose (nasal swab). This sample can be tested to determine what virus is causing the illness. How is this treated? URIs usually get better on their own within 7-10 days. Medicines cannot cure URIs, but your health care provider may recommend certain medicines to help relieve symptoms, such as: Over-the-counter cold medicines. Cough suppressants. Coughing is a type of defense against infection that helps to clear the respiratory system, so take these medicines only  as recommended by your health care provider. Fever-reducing medicines. Follow these instructions at home: Activity Rest as needed. If you have a fever, stay home from work or school until your fever is gone or until your health care provider says your URI cannot spread to other people (is no longer contagious). Your health care provider may have you wear a face mask to prevent your infection from spreading. Relieving symptoms Gargle with a mixture of salt and water 3-4 times a day or as needed. To make salt water, completely dissolve -1 tsp (3-6 g) of salt in 1 cup (237 mL) of warm water. Use a cool-mist humidifier to add moisture to the air. This can help you breathe more easily. Eating and drinking  Drink enough fluid to keep your urine pale yellow. Eat soups and other clear broths. General instructions  Take over-the-counter and prescription medicines only as told by your health care provider. These include cold medicines, fever reducers, and cough suppressants. Do not use any products that contain nicotine  or tobacco. These products include cigarettes, chewing tobacco, and vaping devices, such as e-cigarettes. If you need help quitting, ask your health care provider. Stay away from secondhand smoke. Stay up to date on all immunizations, including the yearly (annual) flu vaccine. Keep all follow-up visits. This is important. How to prevent the spread of infection to others URIs can be contagious. To prevent the infection from spreading: Wash your hands with soap and water for at least 20 seconds. If soap and water are not available, use hand sanitizer. Avoid touching your mouth, face, eyes, or nose. Cough or sneeze into a tissue or your sleeve or elbow instead of into your hand or into the air.  Contact a health care provider if: You are getting worse instead of better. You have a fever or chills. Your mucus is brown or red. You have yellow or brown discharge coming from your  nose. You have pain in your face, especially when you bend forward. You have swollen neck glands. You have pain while swallowing. You have white areas in the back of your throat. Get help right away if: You have shortness of breath that gets worse. You have severe or persistent: Headache. Ear pain. Sinus pain. Chest pain. You have chronic lung disease along with any of the following: Making  high-pitched whistling sounds when you breathe, most often when you breathe out (wheezing). Prolonged cough (more than 14 days). Coughing up blood. A change in your usual mucus. You have a stiff neck. You have changes in your: Vision. Hearing. Thinking. Mood. These symptoms may be an emergency. Get help right away. Call 911. Do not wait to see if the symptoms will go away. Do not drive yourself to the hospital. Summary An upper respiratory infection (URI) is a common infection of the nose, throat, and upper air passages that lead to the lungs. A URI is caused by a virus. URIs usually get better on their own within 7-10 days. Medicines cannot cure URIs, but your health care provider may recommend certain medicines to help relieve symptoms. This information is not intended to replace advice given to you by your health care provider. Make sure you discuss any questions you have with your health care provider. Document Revised: 11/07/2020 Document Reviewed: 11/07/2020 Elsevier Patient Education  2024 Elsevier Inc.   If you have been instructed to have an in-person evaluation today at a local Urgent Care facility, please use the link below. It will take you to a list of all of our available [facility], including address, phone number and hours of operation. Please do not delay care.  [facility]  If you or a family member do not have a primary care provider, use the link below to schedule a visit and establish care. When you choose a [facility] primary care physician  or advanced practice provider, you gain a long-term partner in health. Find a Primary Care Provider  Learn more about [facility]'s in-office and virtual care options: [facility] - Get Care Now

## 2023-12-02 ENCOUNTER — Encounter: Payer: Self-pay | Admitting: Internal Medicine

## 2023-12-03 ENCOUNTER — Ambulatory Visit (HOSPITAL_COMMUNITY)

## 2023-12-03 ENCOUNTER — Encounter (HOSPITAL_COMMUNITY)

## 2023-12-03 ENCOUNTER — Telehealth: Admitting: Physician Assistant

## 2023-12-03 DIAGNOSIS — R0981 Nasal congestion: Secondary | ICD-10-CM

## 2023-12-03 DIAGNOSIS — R059 Cough, unspecified: Secondary | ICD-10-CM | POA: Diagnosis not present

## 2023-12-03 DIAGNOSIS — J45909 Unspecified asthma, uncomplicated: Secondary | ICD-10-CM

## 2023-12-03 MED ORDER — IPRATROPIUM-ALBUTEROL 0.5-2.5 (3) MG/3ML IN SOLN: 3.0000 mL | Freq: Three times a day (TID) | RESPIRATORY_TRACT | 5 refills | Status: AC

## 2023-12-03 MED ORDER — BENZONATATE 100 MG PO CAPS: 100.0000 mg | ORAL_CAPSULE | Freq: Three times a day (TID) | ORAL | 0 refills | Status: DC | PRN

## 2023-12-03 MED ORDER — FLUTICASONE PROPIONATE 50 MCG/ACT NA SUSP: 2.0000 | Freq: Every day | NASAL | 0 refills | Status: DC

## 2023-12-03 NOTE — Progress Notes (Signed)
 Virtual Visit Consent   Emily [name], you are scheduled for a virtual visit with a [facility] provider today. Just as with appointments in the office, your consent must be obtained to participate. Your consent will be active for this visit and any virtual visit you may have with one of our providers in the next 365 days. If you have a MyChart account, a copy of this consent can be sent to you electronically.  As this is a virtual visit, video technology does not allow for your provider to perform a traditional examination. This may limit your provider's ability to fully assess your condition. If your provider identifies any concerns that need to be evaluated in person or the need to arrange testing (such as labs, EKG, etc.), we will make arrangements to do so. Although advances in technology are sophisticated, we cannot ensure that it will always work on either your end or our end. If the connection with a video visit is poor, the visit may have to be switched to a telephone visit. With either a video or telephone visit, we are not always able to ensure that we have a secure connection.  By engaging in this virtual visit, you consent to the provision of healthcare and authorize for your insurance to be billed (if applicable) for the services provided during this visit. Depending on your insurance coverage, you may receive a charge related to this service.  I need to obtain your verbal consent now. Are you willing to proceed with your visit today? Emily [name] has provided verbal consent on 12/03/2023 for a virtual visit (video or telephone). Emily [name], [place]  Date: 12/03/2023 3:19 PM   Virtual Visit via Video Note   I, Emily [name], connected with  Emily [name]  ([id number], 21-Sep-1980) on 12/03/23 at  3:15 PM EDT by a video-enabled telemedicine application and verified that I am speaking with the correct person using two identifiers.  Location: Patient: Virtual Visit  Location Patient: Home Provider: Virtual Visit Location Provider: Home Office   I discussed the limitations of evaluation and management by telemedicine and the availability of in person appointments. The patient expressed understanding and agreed to proceed.    History of Present Illness: Emily [name] is a 43 y.o. who identifies as a female who was assigned female at birth, and is being seen today for continued nasal congestion, drainage and cough with chest congestion after multiple courses of antibiotics. Denies fever, chills. Notes chest congestion improved but significant nasal congestion making it harder to breathe. Some mild SOB with exertion. Contacted her Pulmonologist today who sent in refill of her nebulizer solution to use as directed.    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   Bulimia nervosa 06/15/2023   Borderline personality disorder (HCC) 06/15/2023   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 06/15/2023   Chronic prescription benzodiazepine use 06/15/2023   History of alcohol use disorder in sustained remission 06/15/2023  Polypharmacy 06/15/2023   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   Caffeine-induced insomnia with snoring 06/15/2023   Irregular bleeding 06/02/2023   Pelvic cramping 06/02/2023   Urinary frequency 06/02/2023   Pregnancy examination or test, negative result 06/02/2023   Missed periods  06/02/2023   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   Sebaceous cyst of labia 02/03/2023   Cyst of left ovary 08/11/2022   ASCUS with positive high risk HPV cervical 08/11/2022   [place]y 07/30/2022   Hot flashes 07/30/2022   Friable cervix 07/30/2022   Spotting 07/30/2022   Amenorrhea, secondary 07/30/2022   Bartholin cyst 04/30/2022   Irregular periods 04/30/2022   Dental caries 03/11/2022   Tick bite of back 12/20/2020   Chronic bronchitis (HCC) 07/09/2020   Breast tenderness 09/20/2018   Gastroesophageal reflux disease 09/16/2018   Shortness of breath 08/05/2018   Abnormal finding on lung imaging 08/05/2018   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   Swollen lymph nodes 01/06/2017   RUQ pain 10/08/2016   LLQ pain 10/08/2016   Menstrual period late 10/08/2016   Cyst of right Bartholin's gland 12/17/2015   Recurrent boils 06/18/2015   Type 2 diabetes mellitus with complication, without long-term current use of insulin  (HCC) 04/18/2015   Ingrowing toenail 04/18/2015   Vitamin D  deficiency 04/18/2015   Angular cheilitis 04/18/2015   Dyslipidemia 04/18/2015   Diabetes mellitus with complication in adult patient (HCC) 02/20/2015   Smoker 02/20/2015   Essential hypertension 02/20/2015   Pain in limb 02/20/2015   Myalgia 02/20/2015   Hypothyroidism 02/20/2015   Leg pain, inferior 02/20/2015   Toenail deformity 02/20/2015   Elevated liver enzymes 01/07/2012   Abnormal CT of liver 09/30/2011   Fatty liver 09/30/2011   G E REFLUX 11/30/2007   CHEST PAIN-UNSPECIFIED 11/30/2007   HEADACHE 10/21/2007   DYSPNEA 09/15/2007   UNSPECIFIED TACHYCARDIA 06/01/2007   COUGH 05/24/2007   TOBACCO ABUSE 05/20/2007   Pulmonary nodules 05/12/2007   DEPRESSION 04/19/2007   Asthma 04/19/2007    Allergies:  Allergies  Allergen Reactions   Bactrim  [Sulfamethoxazole -Trimethoprim ] Anaphylaxis and Other (See Comments)    Chest pain, trouble breathing   Ciprofloxacin Palpitations   Clindamycin /Lincomycin      Other reaction(s): Chest Pain   Flagyl  [Metronidazole ] Shortness Of Breath   Hydrocodone Other (See Comments)   Iodinated Contrast Media Swelling   Ondansetron Shortness Of Breath   Liraglutide  Nausea And Vomiting   Doxycycline  Nausea And Vomiting   Penicillins    Synjardy  Xr [Empagliflozin-Metformin  Hcl Er]    Medications:  Current Outpatient Medications:    ACCU-CHEK AVIVA PLUS test strip, 1 each by Other route 3 (three) times daily., Disp: , Rfl:    ACCU-CHEK FASTCLIX LANCETS MISC, USE AS DIRECTED, Disp: 102 each, Rfl: 3   albuterol  (VENTOLIN  HFA) 108 (90 Base) MCG/ACT inhaler, Inhale 2 puffs into the lungs every 6 (six) hours as needed for wheezing or shortness of breath., Disp: 18 g, Rfl: 3   amLODipine  (NORVASC ) 5 MG tablet, TAKE 1 TABLET (5 MG TOTAL) BY MOUTH DAILY., Disp: 90 tablet, Rfl: 3   azithromycin  (ZITHROMAX ) 250 MG tablet, Take 2 tablets on day 1, then 1 tablet daily on days 2 through 5, Disp: 6 tablet, Rfl: 0   benzonatate  (TESSALON ) 100 MG capsule, Take 1 capsule (100 mg total) by mouth 3 (three) times daily as needed for cough., Disp: 30 capsule, Rfl: 0   Blood Glucose Monitoring Suppl (ACCU-CHEK AVIVA PLUS) w/Device KIT, USE AS DIRECTED, Disp: 1 kit, Rfl: 0   Blood Glucose Monitoring Suppl  DEVI, 1 each by Does not apply route in the morning, at noon, and at bedtime. May substitute to any manufacturer covered by patient's insurance., Disp: 1 each, Rfl: 0   clonazePAM  (KLONOPIN ) 1 MG tablet, Take 1 mg by mouth 2 (two) times daily., Disp: , Rfl:    Continuous Glucose Receiver (DEXCOM G7 RECEIVER) DEVI, 1 Device by Does not apply route continuous., Disp: 1 each, Rfl: 0   Continuous Glucose Sensor (DEXCOM G7 SENSOR) MISC, USE AS DIRECTED, Disp: 9 each, Rfl: 0   cyclobenzaprine  (FLEXERIL ) 10 MG tablet, Take 1 tablet (10 mg total) by mouth 3 (three) times daily as needed., Disp: 15 tablet, Rfl: 0   fluticasone  (FLONASE ) 50 MCG/ACT nasal spray, Place 2 sprays into both nostrils  daily., Disp: 16 g, Rfl: 6   fluticasone -salmeterol (ADVAIR) 250-50 MCG/ACT AEPB, Inhale 1 puff into the lungs every 12 (twelve) hours. Rinse after use., Disp: 60 each, Rfl: 5   insulin  lispro (HUMALOG  KWIKPEN) 100 UNIT/ML KwikPen, Max daily 45 units, Disp: 30 mL, Rfl: 4   Insulin  Pen Needle 32G X 4 MM MISC, 1 Device by Does not apply route in the morning, at noon, in the evening, and at bedtime., Disp: 400 each, Rfl: 3   ipratropium (ATROVENT ) 0.03 % nasal spray, Place 2 sprays into both nostrils every 12 (twelve) hours., Disp: 30 mL, Rfl: 0   ipratropium-albuterol  (DUONEB) 0.5-2.5 (3) MG/3ML SOLN, Take 3 mLs by nebulization in the morning, at noon, and at bedtime., Disp: 270 mL, Rfl: 5   lamoTRIgine  (LAMICTAL ) 150 MG tablet, Take 150 mg by mouth daily., Disp: , Rfl:    levothyroxine  (SYNTHROID ) 75 MCG tablet, TAKE 1 TABLET BY MOUTH EVERY DAY BEFORE BREAKFAST, Disp: 90 tablet, Rfl: 0   metFORMIN  (GLUCOPHAGE -XR) 500 MG 24 hr tablet, Take 1 tablet (500 mg total) by mouth 2 (two) times daily with a meal., Disp: 60 tablet, Rfl: 5   montelukast  (SINGULAIR ) 10 MG tablet, TAKE 1 TABLET BY MOUTH EVERYDAY AT BEDTIME, Disp: 90 tablet, Rfl: 3   naproxen  (NAPROSYN ) 500 MG tablet, Take 1 tablet (500 mg total) by mouth 2 (two) times daily with a meal., Disp: 30 tablet, Rfl: 0   Nebulizers (COMPRESSOR/NEBULIZER) MISC, Use as directed, Disp: 1 each, Rfl: 0   nitroGLYCERIN  (NITROSTAT ) 0.4 MG SL tablet, Place 1 tablet (0.4 mg total) under the tongue every 5 (five) minutes as needed for chest pain., Disp: 25 tablet, Rfl: 3   nystatin  cream (MYCOSTATIN ), Apply to affected area 2 times daily, Disp: 30 g, Rfl: 0   omeprazole  (PRILOSEC) 40 MG capsule, Take 40 mg by mouth every morning., Disp: , Rfl:    PARoxetine  (PAXIL ) 10 MG tablet, Take 10 mg by mouth at bedtime., Disp: , Rfl:    promethazine  (PHENERGAN ) 12.5 MG tablet, Take 12.5 mg by mouth every 8 (eight) hours as needed for vomiting or nausea., Disp: , Rfl:     promethazine -dextromethorphan  (PROMETHAZINE -DM) 6.25-15 MG/5ML syrup, Take 5 mLs by mouth 4 (four) times daily as needed., Disp: 118 mL, Rfl: 0   propranolol  (INDERAL ) 20 MG tablet, Take 1 tablet (20 mg total) by mouth 3 (three) times daily., Disp: 270 tablet, Rfl: 3   pseudoephedrine  (SUDAFED) 120 MG 12 hr tablet, Take 1 tablet (120 mg total) by mouth 2 (two) times daily., Disp: 20 tablet, Rfl: 0   Respiratory Therapy Supplies (NEBULIZER/TUBING/MOUTHPIECE) KIT, Use as directed, Disp: 10 kit, Rfl: 11   rosuvastatin  (CRESTOR ) 5 MG tablet, Take 1 tablet (5 mg total)  by mouth daily., Disp: 90 tablet, Rfl: 1   solifenacin  (VESICARE ) 5 MG tablet, Take 1 tablet (5 mg total) by mouth daily., Disp: 30 tablet, Rfl: 2   Vitamin D , Ergocalciferol , (DRISDOL ) 1.25 MG (50000 UNIT) CAPS capsule, Take 50,000 Units by mouth every 7 (seven) days., Disp: , Rfl:    ziprasidone  (GEODON ) 60 MG capsule, Take 60 mg by mouth at bedtime., Disp: , Rfl:   Observations/Objective: Patient is well-developed, well-nourished in no acute distress.  Resting comfortably  at home.  Head is normocephalic, atraumatic.  No labored breathing.  Speech is clear and coherent with logical content.  Patient is alert and oriented at baseline.    Assessment and Plan: 1. Sinus congestion (Primary)  Giving she has been on antibiotics for her acute on chronic bronchitis with development of sinus symptoms, question viral cause. Discussed giving history and symptoms despite prior treatment and in-person evaluation is warranted. Notes she cannot get anywhere today so was trying to do a virtual appointment. Discussed will have her start fluticasone  nasal spray and Tessalon  for cough. Continue chronic medications and start use of her duoneb solution, but any non-improving/new/worsening symptoms after next 24 hours, she needs in person evaluation ASAP, even if it means proceeding to the ER.   Follow Up Instructions: I discussed the assessment and  treatment plan with the patient. The patient was provided an opportunity to ask questions and all were answered. The patient agreed with the plan and demonstrated an understanding of the instructions.  A copy of instructions were sent to the patient via MyChart unless otherwise noted below.    The patient was advised to call back or seek an in-person evaluation if the symptoms worsen or if the condition fails to improve as anticipated.    Emily Velma Lunger, PA-C

## 2023-12-03 NOTE — Telephone Encounter (Signed)
 Called and spoke with the patient. Pt states she has been tested for flu and covid, both were negative. Pt states she has upper respiratory infection and is taking antibiotics. Pt requested appt with Dr. Geronimo and I advised we had no soon openings and pt did not want to be scheduled at a later date. I advised pt to contact PCP, and to be evaluated at ER or UC if she notices any worsening symptoms. \ I also contacted pharmacy, they stated they need new rx sent in able for pt to pick up neb solution.  Rx refill sent to pharmacy & pt is aware.  Nothing further needed.

## 2023-12-03 NOTE — Patient Instructions (Signed)
 [name], thank you for joining Elsie Velma Lunger, PA-C for today's virtual visit.  While this provider is not your primary care provider (PCP), if your PCP is located in our provider database this encounter information will be shared with them immediately following your visit.   A [facility] MyChart account gives you access to today's visit and all your visits, tests, and labs performed at [facility]  click here if you don't have a [facility] MyChart account or go to [email or web address]  Consent: (Patient) Amiera L Guillet provided verbal consent for this virtual visit at the beginning of the encounter.  Current Medications:  Current Outpatient Medications:    benzonatate  (TESSALON ) 100 MG capsule, Take 1 capsule (100 mg total) by mouth 3 (three) times daily as needed for cough., Disp: 30 capsule, Rfl: 0   fluticasone  (FLONASE ) 50 MCG/ACT nasal spray, Place 2 sprays into both nostrils daily., Disp: 16 g, Rfl: 0   ACCU-CHEK AVIVA PLUS test strip, 1 each by Other route 3 (three) times daily., Disp: , Rfl:    ACCU-CHEK FASTCLIX LANCETS MISC, USE AS DIRECTED, Disp: 102 each, Rfl: 3   albuterol  (VENTOLIN  HFA) 108 (90 Base) MCG/ACT inhaler, Inhale 2 puffs into the lungs every 6 (six) hours as needed for wheezing or shortness of breath., Disp: 18 g, Rfl: 3   amLODipine  (NORVASC ) 5 MG tablet, TAKE 1 TABLET (5 MG TOTAL) BY MOUTH DAILY., Disp: 90 tablet, Rfl: 3   azithromycin  (ZITHROMAX ) 250 MG tablet, Take 2 tablets on day 1, then 1 tablet daily on days 2 through 5, Disp: 6 tablet, Rfl: 0   Blood Glucose Monitoring Suppl (ACCU-CHEK AVIVA PLUS) w/Device KIT, USE AS DIRECTED, Disp: 1 kit, Rfl: 0   Blood Glucose Monitoring Suppl DEVI, 1 each by Does not apply route in the morning, at noon, and at bedtime. May substitute to any manufacturer covered by patient's insurance., Disp: 1 each, Rfl: 0   clonazePAM  (KLONOPIN ) 1 MG tablet, Take 1 mg by mouth 2 (two) times daily.,  Disp: , Rfl:    Continuous Glucose Receiver (DEXCOM G7 RECEIVER) DEVI, 1 Device by Does not apply route continuous., Disp: 1 each, Rfl: 0   Continuous Glucose Sensor (DEXCOM G7 SENSOR) MISC, USE AS DIRECTED, Disp: 9 each, Rfl: 0   cyclobenzaprine  (FLEXERIL ) 10 MG tablet, Take 1 tablet (10 mg total) by mouth 3 (three) times daily as needed., Disp: 15 tablet, Rfl: 0   fluticasone -salmeterol (ADVAIR) 250-50 MCG/ACT AEPB, Inhale 1 puff into the lungs every 12 (twelve) hours. Rinse after use., Disp: 60 each, Rfl: 5   insulin  lispro (HUMALOG  KWIKPEN) 100 UNIT/ML KwikPen, Max daily 45 units, Disp: 30 mL, Rfl: 4   Insulin  Pen Needle 32G X 4 MM MISC, 1 Device by Does not apply route in the morning, at noon, in the evening, and at bedtime., Disp: 400 each, Rfl: 3   ipratropium (ATROVENT ) 0.03 % nasal spray, Place 2 sprays into both nostrils every 12 (twelve) hours., Disp: 30 mL, Rfl: 0   ipratropium-albuterol  (DUONEB) 0.5-2.5 (3) MG/3ML SOLN, Take 3 mLs by nebulization in the morning, at noon, and at bedtime., Disp: 270 mL, Rfl: 5   lamoTRIgine  (LAMICTAL ) 150 MG tablet, Take 150 mg by mouth daily., Disp: , Rfl:    levothyroxine  (SYNTHROID ) 75 MCG tablet, TAKE 1 TABLET BY MOUTH EVERY DAY BEFORE BREAKFAST, Disp: 90 tablet, Rfl: 0   metFORMIN  (GLUCOPHAGE -XR) 500 MG 24 hr tablet, Take 1 tablet (500 mg total) by mouth 2 (two)  times daily with a meal., Disp: 60 tablet, Rfl: 5   montelukast  (SINGULAIR ) 10 MG tablet, TAKE 1 TABLET BY MOUTH EVERYDAY AT BEDTIME, Disp: 90 tablet, Rfl: 3   naproxen  (NAPROSYN ) 500 MG tablet, Take 1 tablet (500 mg total) by mouth 2 (two) times daily with a meal., Disp: 30 tablet, Rfl: 0   Nebulizers (COMPRESSOR/NEBULIZER) MISC, Use as directed, Disp: 1 each, Rfl: 0   nitroGLYCERIN  (NITROSTAT ) 0.4 MG SL tablet, Place 1 tablet (0.4 mg total) under the tongue every 5 (five) minutes as needed for chest pain., Disp: 25 tablet, Rfl: 3   nystatin  cream (MYCOSTATIN ), Apply to affected area 2 times  daily, Disp: 30 g, Rfl: 0   omeprazole  (PRILOSEC) 40 MG capsule, Take 40 mg by mouth every morning., Disp: , Rfl:    PARoxetine  (PAXIL ) 10 MG tablet, Take 10 mg by mouth at bedtime., Disp: , Rfl:    promethazine  (PHENERGAN ) 12.5 MG tablet, Take 12.5 mg by mouth every 8 (eight) hours as needed for vomiting or nausea., Disp: , Rfl:    promethazine -dextromethorphan  (PROMETHAZINE -DM) 6.25-15 MG/5ML syrup, Take 5 mLs by mouth 4 (four) times daily as needed., Disp: 118 mL, Rfl: 0   propranolol  (INDERAL ) 20 MG tablet, Take 1 tablet (20 mg total) by mouth 3 (three) times daily., Disp: 270 tablet, Rfl: 3   pseudoephedrine  (SUDAFED) 120 MG 12 hr tablet, Take 1 tablet (120 mg total) by mouth 2 (two) times daily., Disp: 20 tablet, Rfl: 0   Respiratory Therapy Supplies (NEBULIZER/TUBING/MOUTHPIECE) KIT, Use as directed, Disp: 10 kit, Rfl: 11   rosuvastatin  (CRESTOR ) 5 MG tablet, Take 1 tablet (5 mg total) by mouth daily., Disp: 90 tablet, Rfl: 1   solifenacin  (VESICARE ) 5 MG tablet, Take 1 tablet (5 mg total) by mouth daily., Disp: 30 tablet, Rfl: 2   Vitamin D , Ergocalciferol , (DRISDOL ) 1.25 MG (50000 UNIT) CAPS capsule, Take 50,000 Units by mouth every 7 (seven) days., Disp: , Rfl:    ziprasidone  (GEODON ) 60 MG capsule, Take 60 mg by mouth at bedtime., Disp: , Rfl:    Medications ordered in this encounter:  Meds ordered this encounter  Medications   fluticasone  (FLONASE ) 50 MCG/ACT nasal spray    Sig: Place 2 sprays into both nostrils daily.    Dispense:  16 g    Refill:  0    Supervising Provider:   BLAISE ALEENE KIDD [id number]   benzonatate  (TESSALON ) 100 MG capsule    Sig: Take 1 capsule (100 mg total) by mouth 3 (three) times daily as needed for cough.    Dispense:  30 capsule    Refill:  0    Supervising Provider:   BLAISE ALEENE KIDD [[id number]]     *If you need refills on other medications prior to your next appointment, please contact your pharmacy*  Follow-Up: Call back or seek an in-person  evaluation if the symptoms worsen or if the condition fails to improve as anticipated.  [facility] [phone]  Other Instructions Please continue chronic breathing medications, starting the duoneb solution as directed (sent by your Lung Specialist). Start the Tessalon  and Fluticasone  nasal spray as directed. Hydrate and rest. AS discussed, you really need an in-person evaluation so I recommend following up with your PCP ASAP. If any new or worsening symptoms or no improvement in symptoms over next 24 hours, seek in person evaluation ASAP, even at closest ER if severe. DO NOT DELAY CARE.   If you have been instructed to have an  in-person evaluation today at a local Urgent Care facility, please use the link below. It will take you to a list of all of our available [facility], including address, phone number and hours of operation. Please do not delay care.  [facility]  If you or a family member do not have a primary care provider, use the link below to schedule a visit and establish care. When you choose a [place] [facility] primary care physician or advanced practice provider, you gain a long-term partner in health. Find a Primary Care Provider  Learn more about [facility] in-office and virtual care options: [facility] Get Care Now

## 2023-12-04 ENCOUNTER — Inpatient Hospital Stay (HOSPITAL_BASED_OUTPATIENT_CLINIC_OR_DEPARTMENT_OTHER)
Admission: EM | Admit: 2023-12-04 | Discharge: 2023-12-13 | DRG: 871 | Disposition: A | Discharge status: Home / Self Care | Attending: Internal Medicine | Admitting: Internal Medicine

## 2023-12-04 ENCOUNTER — Emergency Department (HOSPITAL_BASED_OUTPATIENT_CLINIC_OR_DEPARTMENT_OTHER)

## 2023-12-04 ENCOUNTER — Telehealth: Payer: Self-pay | Admitting: Pulmonary Disease

## 2023-12-04 ENCOUNTER — Encounter (HOSPITAL_BASED_OUTPATIENT_CLINIC_OR_DEPARTMENT_OTHER): Payer: Self-pay

## 2023-12-04 ENCOUNTER — Other Ambulatory Visit: Payer: Self-pay

## 2023-12-04 DIAGNOSIS — R6520 Severe sepsis without septic shock: Secondary | ICD-10-CM | POA: Diagnosis present

## 2023-12-04 DIAGNOSIS — Z79890 Hormone replacement therapy (postmenopausal): Secondary | ICD-10-CM

## 2023-12-04 DIAGNOSIS — Z79899 Other long term (current) drug therapy: Secondary | ICD-10-CM

## 2023-12-04 DIAGNOSIS — M329 Systemic lupus erythematosus, unspecified: Secondary | ICD-10-CM | POA: Diagnosis present

## 2023-12-04 DIAGNOSIS — J441 Chronic obstructive pulmonary disease with (acute) exacerbation: Secondary | ICD-10-CM | POA: Diagnosis present

## 2023-12-04 DIAGNOSIS — E669 Obesity, unspecified: Secondary | ICD-10-CM | POA: Diagnosis present

## 2023-12-04 DIAGNOSIS — R918 Other nonspecific abnormal finding of lung field: Secondary | ICD-10-CM | POA: Diagnosis not present

## 2023-12-04 DIAGNOSIS — J129 Viral pneumonia, unspecified: Secondary | ICD-10-CM

## 2023-12-04 DIAGNOSIS — Z794 Long term (current) use of insulin: Secondary | ICD-10-CM

## 2023-12-04 DIAGNOSIS — F332 Major depressive disorder, recurrent severe without psychotic features: Secondary | ICD-10-CM | POA: Diagnosis present

## 2023-12-04 DIAGNOSIS — E871 Hypo-osmolality and hyponatremia: Secondary | ICD-10-CM | POA: Diagnosis present

## 2023-12-04 DIAGNOSIS — Z885 Allergy status to narcotic agent status: Secondary | ICD-10-CM

## 2023-12-04 DIAGNOSIS — E8809 Other disorders of plasma-protein metabolism, not elsewhere classified: Secondary | ICD-10-CM | POA: Diagnosis present

## 2023-12-04 DIAGNOSIS — J122 Parainfluenza virus pneumonia: Secondary | ICD-10-CM | POA: Diagnosis present

## 2023-12-04 DIAGNOSIS — Z888 Allergy status to other drugs, medicaments and biological substances status: Secondary | ICD-10-CM

## 2023-12-04 DIAGNOSIS — B348 Other viral infections of unspecified site: Secondary | ICD-10-CM

## 2023-12-04 DIAGNOSIS — D7589 Other specified diseases of blood and blood-forming organs: Secondary | ICD-10-CM | POA: Diagnosis present

## 2023-12-04 DIAGNOSIS — F17210 Nicotine dependence, cigarettes, uncomplicated: Secondary | ICD-10-CM | POA: Diagnosis present

## 2023-12-04 DIAGNOSIS — Z91041 Radiographic dye allergy status: Secondary | ICD-10-CM

## 2023-12-04 DIAGNOSIS — F411 Generalized anxiety disorder: Secondary | ICD-10-CM | POA: Diagnosis present

## 2023-12-04 DIAGNOSIS — E876 Hypokalemia: Secondary | ICD-10-CM | POA: Diagnosis not present

## 2023-12-04 DIAGNOSIS — E785 Hyperlipidemia, unspecified: Secondary | ICD-10-CM | POA: Diagnosis present

## 2023-12-04 DIAGNOSIS — Z825 Family history of asthma and other chronic lower respiratory diseases: Secondary | ICD-10-CM

## 2023-12-04 DIAGNOSIS — J9601 Acute respiratory failure with hypoxia: Secondary | ICD-10-CM | POA: Diagnosis present

## 2023-12-04 DIAGNOSIS — F319 Bipolar disorder, unspecified: Secondary | ICD-10-CM | POA: Diagnosis present

## 2023-12-04 DIAGNOSIS — T380X5A Adverse effect of glucocorticoids and synthetic analogues, initial encounter: Secondary | ICD-10-CM | POA: Diagnosis present

## 2023-12-04 DIAGNOSIS — J45901 Unspecified asthma with (acute) exacerbation: Secondary | ICD-10-CM | POA: Diagnosis present

## 2023-12-04 DIAGNOSIS — R079 Chest pain, unspecified: Secondary | ICD-10-CM | POA: Diagnosis not present

## 2023-12-04 DIAGNOSIS — K7460 Unspecified cirrhosis of liver: Secondary | ICD-10-CM | POA: Diagnosis present

## 2023-12-04 DIAGNOSIS — E039 Hypothyroidism, unspecified: Secondary | ICD-10-CM | POA: Diagnosis present

## 2023-12-04 DIAGNOSIS — Z8249 Family history of ischemic heart disease and other diseases of the circulatory system: Secondary | ICD-10-CM

## 2023-12-04 DIAGNOSIS — Z6831 Body mass index (BMI) 31.0-31.9, adult: Secondary | ICD-10-CM

## 2023-12-04 DIAGNOSIS — J310 Chronic rhinitis: Secondary | ICD-10-CM | POA: Diagnosis present

## 2023-12-04 DIAGNOSIS — K219 Gastro-esophageal reflux disease without esophagitis: Secondary | ICD-10-CM | POA: Diagnosis present

## 2023-12-04 DIAGNOSIS — Z833 Family history of diabetes mellitus: Secondary | ICD-10-CM

## 2023-12-04 DIAGNOSIS — R0789 Other chest pain: Secondary | ICD-10-CM | POA: Diagnosis not present

## 2023-12-04 DIAGNOSIS — Z880 Allergy status to penicillin: Secondary | ICD-10-CM

## 2023-12-04 DIAGNOSIS — F17200 Nicotine dependence, unspecified, uncomplicated: Secondary | ICD-10-CM | POA: Diagnosis present

## 2023-12-04 DIAGNOSIS — Z7984 Long term (current) use of oral hypoglycemic drugs: Secondary | ICD-10-CM

## 2023-12-04 DIAGNOSIS — J42 Unspecified chronic bronchitis: Secondary | ICD-10-CM | POA: Diagnosis present

## 2023-12-04 DIAGNOSIS — E118 Type 2 diabetes mellitus with unspecified complications: Secondary | ICD-10-CM

## 2023-12-04 DIAGNOSIS — A4189 Other specified sepsis: Principal | ICD-10-CM | POA: Diagnosis present

## 2023-12-04 DIAGNOSIS — E1165 Type 2 diabetes mellitus with hyperglycemia: Secondary | ICD-10-CM | POA: Diagnosis present

## 2023-12-04 DIAGNOSIS — J189 Pneumonia, unspecified organism: Principal | ICD-10-CM

## 2023-12-04 DIAGNOSIS — J440 Chronic obstructive pulmonary disease with acute lower respiratory infection: Secondary | ICD-10-CM | POA: Diagnosis present

## 2023-12-04 DIAGNOSIS — I10 Essential (primary) hypertension: Secondary | ICD-10-CM | POA: Diagnosis present

## 2023-12-04 DIAGNOSIS — Z7951 Long term (current) use of inhaled steroids: Secondary | ICD-10-CM

## 2023-12-04 DIAGNOSIS — E119 Type 2 diabetes mellitus without complications: Secondary | ICD-10-CM

## 2023-12-04 LAB — BASIC METABOLIC PANEL WITH GFR
Anion gap: 15 (ref 5–15)
BUN: 5 mg/dL — ABNORMAL LOW (ref 6–20)
CO2: 22 mmol/L (ref 22–32)
Calcium: 9.3 mg/dL (ref 8.9–10.3)
Chloride: 95 mmol/L — ABNORMAL LOW (ref 98–111)
Creatinine, Ser: 0.65 mg/dL (ref 0.44–1.00)
GFR, Estimated: >60 mL/min (ref >60)
Glucose, Bld: 474 mg/dL — ABNORMAL HIGH (ref 70–99)
Potassium: 3.5 mmol/L (ref 3.5–5.1)
Sodium: 131 mmol/L — ABNORMAL LOW (ref 135–145)

## 2023-12-04 LAB — CBC
HCT: 40.7 % (ref 36.0–46.0)
Hemoglobin: 14.6 g/dL (ref 12.0–15.0)
MCH: 34.7 pg — ABNORMAL HIGH (ref 26.0–34.0)
MCHC: 35.9 g/dL (ref 30.0–36.0)
MCV: 96.7 fL (ref 80.0–100.0)
Platelets: 211 K/uL (ref 150–400)
RBC: 4.21 MIL/uL (ref 3.87–5.11)
RDW: 15.6 % — ABNORMAL HIGH (ref 11.5–15.5)
WBC: 15.4 K/uL — ABNORMAL HIGH (ref 4.0–10.5)
nRBC: 0 % (ref 0.0–0.2)

## 2023-12-04 LAB — D-DIMER, QUANTITATIVE: D-Dimer, Quant: <0.27 ug{FEU}/mL (ref 0.00–0.50)

## 2023-12-04 LAB — RESP PANEL BY RT-PCR (RSV, FLU A&B, COVID)  RVPGX2
Influenza A by PCR: NEGATIVE
Influenza B by PCR: NEGATIVE
Resp Syncytial Virus by PCR: NEGATIVE
SARS Coronavirus 2 by RT PCR: NEGATIVE

## 2023-12-04 LAB — TROPONIN T, HIGH SENSITIVITY: Troponin T High Sensitivity: <15 ng/L (ref 0–19)

## 2023-12-04 NOTE — ED Provider Notes (Signed)
 [place] EMERGENCY DEPARTMENT AT [facility] Provider Note   CSN: [id number] Arrival date & time: 12/04/23  2237     Patient presents with: Chest Pain and Shortness of Breath   Emily [name] is a 43 y.o. female.   Patient presents to the emergency department with shortness of breath.  Patient reports that she has multiple sick contacts, has been coughing, has chest congestion.  She thinks she has a bronchitis.  She does have a history of asthma.  She has been using her albuterol  treatments at home without improvement.  She was also placed on prednisone , took her second dose today without improvement.       Prior to Admission medications   Medication Sig Start Date End Date Taking? Authorizing Provider  ACCU-CHEK AVIVA PLUS test strip 1 each by Other route 3 (three) times daily. 04/28/20   [provider]  ACCU-CHEK FASTCLIX LANCETS MISC USE AS DIRECTED 12/10/17   Tysinger, Alm RAMAN, PA-C  albuterol  (VENTOLIN  HFA) 108 (90 Base) MCG/ACT inhaler Inhale 2 puffs into the lungs every 6 (six) hours as needed for wheezing or shortness of breath. 08/05/23   Ruthell Lauraine FALCON, NP  amLODipine  (NORVASC ) 5 MG tablet TAKE 1 TABLET (5 MG TOTAL) BY MOUTH DAILY. 03/04/23   Lavona Agent, MD  azithromycin  (ZITHROMAX ) 250 MG tablet Take 2 tablets on day 1, then 1 tablet daily on days 2 through 5 11/30/23 12/05/23  Vivienne Delon HERO, PA-C  benzonatate  (TESSALON ) 100 MG capsule Take 1 capsule (100 mg total) by mouth 3 (three) times daily as needed for cough. 12/03/23   Gladis Elsie BROCKS, PA-C  Blood Glucose Monitoring Suppl (ACCU-CHEK AVIVA PLUS) w/Device KIT USE AS DIRECTED 05/01/16   Tysinger, Alm RAMAN, PA-C  Blood Glucose Monitoring Suppl DEVI 1 each by Does not apply route in the morning, at noon, and at bedtime. May substitute to any manufacturer covered by patient's insurance. 01/22/23   Motwani, Komal, MD  clonazePAM  (KLONOPIN ) 1 MG tablet Take 1 mg by mouth 2 (two) times daily.     [provider]  Continuous Glucose Receiver (DEXCOM G7 RECEIVER) DEVI 1 Device by Does not apply route continuous. 01/22/23   Motwani, Komal, MD  Continuous Glucose Sensor (DEXCOM G7 SENSOR) MISC USE AS DIRECTED 08/31/23   Dartha Ernst, MD  cyclobenzaprine  (FLEXERIL ) 10 MG tablet Take 1 tablet (10 mg total) by mouth 3 (three) times daily as needed. 09/20/23   Geroldine Berg, MD  fluticasone  (FLONASE ) 50 MCG/ACT nasal spray Place 2 sprays into both nostrils daily. 12/03/23   Gladis Elsie BROCKS, PA-C  fluticasone -salmeterol (ADVAIR) 250-50 MCG/ACT AEPB Inhale 1 puff into the lungs every 12 (twelve) hours. Rinse after use. 07/22/22   Parrett, Madelin RAMAN, NP  insulin  lispro (HUMALOG  KWIKPEN) 100 UNIT/ML KwikPen Max daily 45 units 07/03/23   Shamleffer, Ibtehal Jaralla, MD  Insulin  Pen Needle 32G X 4 MM MISC 1 Device by Does not apply route in the morning, at noon, in the evening, and at bedtime. 07/03/23   Shamleffer, Ibtehal Jaralla, MD  ipratropium (ATROVENT ) 0.03 % nasal spray Place 2 sprays into both nostrils every 12 (twelve) hours. 09/16/23   Gladis Elsie BROCKS, PA-C  ipratropium-albuterol  (DUONEB) 0.5-2.5 (3) MG/3ML SOLN Take 3 mLs by nebulization in the morning, at noon, and at bedtime. 12/03/23   Geronimo Amel, MD  lamoTRIgine  (LAMICTAL ) 150 MG tablet Take 150 mg by mouth daily.    [provider]  levothyroxine  (SYNTHROID ) 75 MCG tablet TAKE 1 TABLET  BY MOUTH EVERY DAY BEFORE BREAKFAST 09/21/23   [name], NP  metFORMIN  (GLUCOPHAGE -XR) 500 MG 24 hr tablet Take 1 tablet (500 mg total) by mouth 2 (two) times daily with a meal. 09/21/23   Motwani, Komal, MD  montelukast  (SINGULAIR ) 10 MG tablet TAKE 1 TABLET BY MOUTH EVERYDAY AT BEDTIME 07/20/23   Parrett, Madelin RAMAN, NP  naproxen  (NAPROSYN ) 500 MG tablet Take 1 tablet (500 mg total) by mouth 2 (two) times daily with a meal. 05/15/23   Kennyth Domino, FNP  Nebulizers (COMPRESSOR/NEBULIZER) MISC Use as directed 11/15/19   Geronimo Amel,  MD  nitroGLYCERIN  (NITROSTAT ) 0.4 MG SL tablet Place 1 tablet (0.4 mg total) under the tongue every 5 (five) minutes as needed for chest pain. 03/10/22   Nahser, Aleene PARAS, MD  nystatin  cream (MYCOSTATIN ) Apply to affected area 2 times daily 07/06/23   Cleotilde Rogue, MD  omeprazole  (PRILOSEC) 40 MG capsule Take 40 mg by mouth every morning. 08/05/21   [provider]  PARoxetine  (PAXIL ) 10 MG tablet Take 10 mg by mouth at bedtime. 05/27/23   [provider]  promethazine  (PHENERGAN ) 12.5 MG tablet Take 12.5 mg by mouth every 8 (eight) hours as needed for vomiting or nausea. 09/24/21   [provider]  promethazine -dextromethorphan  (PROMETHAZINE -DM) 6.25-15 MG/5ML syrup Take 5 mLs by mouth 4 (four) times daily as needed. 11/20/23   Vivienne Delon HERO, PA-C  propranolol  (INDERAL ) 20 MG tablet Take 1 tablet (20 mg total) by mouth 3 (three) times daily. 03/31/23   Lavona Agent, MD  pseudoephedrine  (SUDAFED) 120 MG 12 hr tablet Take 1 tablet (120 mg total) by mouth 2 (two) times daily. 06/11/23   Lucienne Loveless, PA-C  Respiratory Therapy Supplies (NEBULIZER/TUBING/MOUTHPIECE) KIT Use as directed 11/15/19   Geronimo Amel, MD  rosuvastatin  (CRESTOR ) 5 MG tablet Take 1 tablet (5 mg total) by mouth daily. 07/23/23   Motwani, Komal, MD  solifenacin  (VESICARE ) 5 MG tablet Take 1 tablet (5 mg total) by mouth daily. 07/02/23 07/01/24  Signa Delon LABOR, NP  Vitamin D , Ergocalciferol , (DRISDOL ) 1.25 MG (50000 UNIT) CAPS capsule Take 50,000 Units by mouth every 7 (seven) days. 07/23/23   [provider]  ziprasidone  (GEODON ) 60 MG capsule Take 60 mg by mouth at bedtime. 05/22/20   [provider]    Allergies: Bactrim  [sulfamethoxazole -trimethoprim ], Ciprofloxacin, Clindamycin /lincomycin, Flagyl  [metronidazole ], Hydrocodone, Iodinated contrast media, Ondansetron, Liraglutide , Doxycycline , Penicillins, and Synjardy  xr [empagliflozin-metformin  hcl er]    Review of  Systems  Updated Vital Signs BP 138/89   Pulse 80   Temp 98.6 F (37 C) (Oral)   Resp (!) 24   LMP 10/29/2023 (Approximate)   SpO2 92%   Physical Exam Vitals and nursing note reviewed.  Constitutional:      General: She is not in acute distress.    Appearance: She is well-developed.  HENT:     Head: Normocephalic and atraumatic.     Mouth/Throat:     Mouth: Mucous membranes are moist.  Eyes:     General: Vision grossly intact. Gaze aligned appropriately.     Extraocular Movements: Extraocular movements intact.     Conjunctiva/sclera: Conjunctivae normal.  Cardiovascular:     Rate and Rhythm: Normal rate and regular rhythm.     Pulses: Normal pulses.     Heart sounds: Normal heart sounds, S1 normal and S2 normal. No murmur heard.    No friction rub. No gallop.  Pulmonary:     Effort: Tachypnea, accessory muscle usage and prolonged  expiration present. No respiratory distress.     Breath sounds: Decreased air movement present. Decreased breath sounds, wheezing and rhonchi present.  Abdominal:     General: Bowel sounds are normal.     Palpations: Abdomen is soft.     Tenderness: There is no abdominal tenderness. There is no guarding or rebound.     Hernia: No hernia is present.  Musculoskeletal:        General: No swelling.     Cervical back: Full passive range of motion without pain, normal range of motion and neck supple. No spinous process tenderness or muscular tenderness. Normal range of motion.     Right lower leg: No edema.     Left lower leg: No edema.  Skin:    General: Skin is warm and dry.     Capillary Refill: Capillary refill takes less than 2 seconds.     Findings: No ecchymosis, erythema, rash or wound.  Neurological:     General: No focal deficit present.     Mental Status: She is alert and oriented to person, place, and time.     GCS: GCS eye subscore is 4. GCS verbal subscore is 5. GCS motor subscore is 6.     Cranial Nerves: Cranial nerves 2-12 are  intact.     Sensory: Sensation is intact.     Motor: Motor function is intact.     Coordination: Coordination is intact.  Psychiatric:        Attention and Perception: Attention normal.        Mood and Affect: Mood normal.        Speech: Speech normal.        Behavior: Behavior normal.     (all labs ordered are listed, but only abnormal results are displayed) Labs Reviewed  BASIC METABOLIC PANEL WITH GFR - Abnormal; Notable for the following components:      Result Value   Sodium 131 (*)    Chloride 95 (*)    Glucose, Bld 474 (*)    BUN 5 (*)    All other components within normal limits  CBC - Abnormal; Notable for the following components:   WBC 15.4 (*)    MCH 34.7 (*)    RDW 15.6 (*)    All other components within normal limits  RESP PANEL BY RT-PCR (RSV, FLU A&B, COVID)  RVPGX2  D-DIMER, QUANTITATIVE  PREGNANCY, URINE  TROPONIN T, HIGH SENSITIVITY  TROPONIN T, HIGH SENSITIVITY    EKG: EKG Interpretation Date/Time:  Friday December 04 2023 22:54:32 EDT Ventricular Rate:  81 PR Interval:  136 QRS Duration:  112 QT Interval:  389 QTC Calculation: 452 R Axis:   63  Text Interpretation: Sinus rhythm Borderline intraventricular conduction delay RSR' in V1 or V2, right VCD or RVH No significant change since last tracing Confirmed by Haze Lonni PARAS [phone]) on 12/04/2023 11:33:03 PM  Radiology: DG Chest Portable 1 View Result Date: 12/05/2023 CLINICAL DATA:  Chest pain EXAM: PORTABLE CHEST 1 VIEW COMPARISON:  11/03/2023 FINDINGS: Heart and mediastinal contours within normal limits. Right lung clear. Patchy left mid and lower lung opacities could reflect early infiltrate. No effusions. No acute bony abnormality. IMPRESSION: Patchy left mid and lower lung opacities concerning for pneumonia. Electronically Signed   By: Franky Crease M.D.   On: 12/05/2023 00:14     Procedures   Medications Ordered in the ED  cefTRIAXone  (ROCEPHIN ) 1 g in sodium chloride  0.9 % 100 mL  IVPB (has no  administration in time range)  azithromycin  (ZITHROMAX ) 500 mg in sodium chloride  0.9 % 250 mL IVPB (has no administration in time range)  methylPREDNISolone  sodium succinate (SOLU-MEDROL ) 125 mg/2 mL injection 125 mg (has no administration in time range)  ipratropium-albuterol  (DUONEB) 0.5-2.5 (3) MG/3ML nebulizer solution 3 mL (has no administration in time range)  prochlorperazine  (COMPAZINE ) injection 10 mg (10 mg Intravenous Given 12/05/23 0033)                                    Medical Decision Making Amount and/or Complexity of Data Reviewed Labs: ordered. Decision-making details documented in ED Course. Radiology: ordered and independent interpretation performed. Decision-making details documented in ED Course. ECG/medicine tests: ordered. Decision-making details documented in ED Course.  Risk Prescription drug management. Decision regarding hospitalization.   Differential Diagnosis considered includes, but not limited to: Asthma exacerbation; Bronchitis; Pneumonia; CHF; ACS; PE   Presents to the emergency department for evaluation of increasing shortness of breath.  Patient reports that she has been sick for a number of days, experiencing cough, chest congestion and increasing shortness of breath.  She has multiple sick contacts at home.  Patient mildly hypoxic here in the ED, requiring supplemental oxygen to maintain her saturations.  She is not chronically on oxygen at home.  Lung examination reveals diffuse rhonchi and wheezing.  Lab work is reassuring.  Cardiac evaluation unremarkable.  X-ray, however, concerning for pneumonia in the left base.  With her new oxygen demand and chronic lung problems, will admit patient for further management.  Administered Rocephin , Zithromax , bronchodilator, Solu-Medrol  here in the ED.     Final diagnoses:  Community acquired pneumonia of left lower lobe of lung    ED Discharge Orders     None          Jozelyn Kuwahara,  Lonni PARAS, MD 12/05/23 [phone]

## 2023-12-04 NOTE — Telephone Encounter (Signed)
 Patient called c/o SOB and low blood oxygen level. 88%.  She is on abx and prednisone  already.  She does not have oxygen at home.  I recommended she report to the ED.

## 2023-12-04 NOTE — ED Notes (Signed)
 Pt placed on 2lt  due to SATS of 88%.

## 2023-12-05 DIAGNOSIS — D7589 Other specified diseases of blood and blood-forming organs: Secondary | ICD-10-CM | POA: Diagnosis not present

## 2023-12-05 DIAGNOSIS — A419 Sepsis, unspecified organism: Secondary | ICD-10-CM

## 2023-12-05 DIAGNOSIS — I10 Essential (primary) hypertension: Secondary | ICD-10-CM

## 2023-12-05 DIAGNOSIS — J45901 Unspecified asthma with (acute) exacerbation: Secondary | ICD-10-CM

## 2023-12-05 DIAGNOSIS — J9601 Acute respiratory failure with hypoxia: Secondary | ICD-10-CM | POA: Diagnosis present

## 2023-12-05 DIAGNOSIS — J411 Mucopurulent chronic bronchitis: Secondary | ICD-10-CM | POA: Diagnosis not present

## 2023-12-05 DIAGNOSIS — K219 Gastro-esophageal reflux disease without esophagitis: Secondary | ICD-10-CM | POA: Diagnosis not present

## 2023-12-05 DIAGNOSIS — F319 Bipolar disorder, unspecified: Secondary | ICD-10-CM | POA: Diagnosis not present

## 2023-12-05 DIAGNOSIS — M329 Systemic lupus erythematosus, unspecified: Secondary | ICD-10-CM | POA: Diagnosis not present

## 2023-12-05 DIAGNOSIS — Z743 Need for continuous supervision: Secondary | ICD-10-CM | POA: Diagnosis not present

## 2023-12-05 DIAGNOSIS — E039 Hypothyroidism, unspecified: Secondary | ICD-10-CM | POA: Diagnosis not present

## 2023-12-05 DIAGNOSIS — Z87891 Personal history of nicotine dependence: Secondary | ICD-10-CM | POA: Diagnosis not present

## 2023-12-05 DIAGNOSIS — E669 Obesity, unspecified: Secondary | ICD-10-CM | POA: Diagnosis not present

## 2023-12-05 DIAGNOSIS — R6520 Severe sepsis without septic shock: Secondary | ICD-10-CM | POA: Diagnosis present

## 2023-12-05 DIAGNOSIS — J129 Viral pneumonia, unspecified: Secondary | ICD-10-CM | POA: Diagnosis not present

## 2023-12-05 DIAGNOSIS — E8809 Other disorders of plasma-protein metabolism, not elsewhere classified: Secondary | ICD-10-CM | POA: Diagnosis not present

## 2023-12-05 DIAGNOSIS — J441 Chronic obstructive pulmonary disease with (acute) exacerbation: Secondary | ICD-10-CM | POA: Diagnosis not present

## 2023-12-05 DIAGNOSIS — F411 Generalized anxiety disorder: Secondary | ICD-10-CM

## 2023-12-05 DIAGNOSIS — E785 Hyperlipidemia, unspecified: Secondary | ICD-10-CM | POA: Diagnosis not present

## 2023-12-05 DIAGNOSIS — F17210 Nicotine dependence, cigarettes, uncomplicated: Secondary | ICD-10-CM | POA: Diagnosis not present

## 2023-12-05 DIAGNOSIS — J189 Pneumonia, unspecified organism: Secondary | ICD-10-CM | POA: Diagnosis not present

## 2023-12-05 DIAGNOSIS — Z794 Long term (current) use of insulin: Secondary | ICD-10-CM | POA: Diagnosis not present

## 2023-12-05 DIAGNOSIS — K7460 Unspecified cirrhosis of liver: Secondary | ICD-10-CM | POA: Diagnosis not present

## 2023-12-05 DIAGNOSIS — J122 Parainfluenza virus pneumonia: Secondary | ICD-10-CM | POA: Diagnosis not present

## 2023-12-05 DIAGNOSIS — F419 Anxiety disorder, unspecified: Secondary | ICD-10-CM | POA: Diagnosis not present

## 2023-12-05 DIAGNOSIS — F17200 Nicotine dependence, unspecified, uncomplicated: Secondary | ICD-10-CM | POA: Diagnosis not present

## 2023-12-05 DIAGNOSIS — E119 Type 2 diabetes mellitus without complications: Secondary | ICD-10-CM | POA: Diagnosis not present

## 2023-12-05 DIAGNOSIS — A4189 Other specified sepsis: Secondary | ICD-10-CM | POA: Diagnosis not present

## 2023-12-05 DIAGNOSIS — F332 Major depressive disorder, recurrent severe without psychotic features: Secondary | ICD-10-CM

## 2023-12-05 DIAGNOSIS — R0602 Shortness of breath: Secondary | ICD-10-CM | POA: Diagnosis not present

## 2023-12-05 DIAGNOSIS — E876 Hypokalemia: Secondary | ICD-10-CM | POA: Diagnosis not present

## 2023-12-05 DIAGNOSIS — E1165 Type 2 diabetes mellitus with hyperglycemia: Secondary | ICD-10-CM | POA: Diagnosis not present

## 2023-12-05 DIAGNOSIS — R0789 Other chest pain: Secondary | ICD-10-CM | POA: Diagnosis not present

## 2023-12-05 DIAGNOSIS — J440 Chronic obstructive pulmonary disease with acute lower respiratory infection: Secondary | ICD-10-CM | POA: Diagnosis not present

## 2023-12-05 DIAGNOSIS — E871 Hypo-osmolality and hyponatremia: Secondary | ICD-10-CM | POA: Diagnosis not present

## 2023-12-05 LAB — COMPREHENSIVE METABOLIC PANEL WITH GFR
ALT: 18 U/L (ref 0–44)
AST: 22 U/L (ref 15–41)
Albumin: 3.3 g/dL — ABNORMAL LOW (ref 3.5–5.0)
Alkaline Phosphatase: 113 U/L (ref 38–126)
Anion gap: 12 (ref 5–15)
BUN: 7 mg/dL (ref 6–20)
CO2: 23 mmol/L (ref 22–32)
Calcium: 8.6 mg/dL — ABNORMAL LOW (ref 8.9–10.3)
Chloride: 98 mmol/L (ref 98–111)
Creatinine, Ser: 0.61 mg/dL (ref 0.44–1.00)
GFR, Estimated: >60 mL/min (ref >60)
Glucose, Bld: 388 mg/dL — ABNORMAL HIGH (ref 70–99)
Potassium: 3.6 mmol/L (ref 3.5–5.1)
Sodium: 133 mmol/L — ABNORMAL LOW (ref 135–145)
Total Bilirubin: 1 mg/dL (ref 0.0–1.2)
Total Protein: 7.9 g/dL (ref 6.5–8.1)

## 2023-12-05 LAB — BLOOD GAS, ARTERIAL
Acid-Base Excess: 3 mmol/L — ABNORMAL HIGH (ref 0.0–2.0)
Bicarbonate: 28.5 mmol/L — ABNORMAL HIGH (ref 20.0–28.0)
Drawn by: 36527
O2 Content: 10 L/min
O2 Saturation: 94.6 %
Patient temperature: 37
pCO2 arterial: 46 mmHg (ref 32–48)
pH, Arterial: 7.4 (ref 7.35–7.45)
pO2, Arterial: 65 mmHg — ABNORMAL LOW (ref 83–108)

## 2023-12-05 LAB — STREP PNEUMONIAE URINARY ANTIGEN: Strep Pneumo Urinary Antigen: NEGATIVE

## 2023-12-05 LAB — RESPIRATORY PANEL BY PCR

## 2023-12-05 LAB — GLUCOSE, CAPILLARY
Glucose-Capillary: 355 mg/dL — ABNORMAL HIGH (ref 70–99)
Glucose-Capillary: 424 mg/dL — ABNORMAL HIGH (ref 70–99)
Glucose-Capillary: 316 mg/dL — ABNORMAL HIGH (ref 70–99)
Glucose-Capillary: 283 mg/dL — ABNORMAL HIGH (ref 70–99)
Glucose-Capillary: 228 mg/dL — ABNORMAL HIGH (ref 70–99)

## 2023-12-05 LAB — CBC
HCT: 40.8 % (ref 36.0–46.0)
Hemoglobin: 13.9 g/dL (ref 12.0–15.0)
MCH: 33.8 pg (ref 26.0–34.0)
MCHC: 34.1 g/dL (ref 30.0–36.0)
MCV: 99.3 fL (ref 80.0–100.0)
Platelets: 168 K/uL (ref 150–400)
RBC: 4.11 MIL/uL (ref 3.87–5.11)
RDW: 15.5 % (ref 11.5–15.5)
WBC: 11.5 K/uL — ABNORMAL HIGH (ref 4.0–10.5)
nRBC: 0 % (ref 0.0–0.2)

## 2023-12-05 LAB — LACTIC ACID, PLASMA: Lactic Acid, Venous: 1.6 mmol/L (ref 0.5–1.9)

## 2023-12-05 LAB — EXPECTORATED SPUTUM ASSESSMENT W GRAM STAIN, RFLX TO RESP C

## 2023-12-05 LAB — HIV ANTIBODY (ROUTINE TESTING W REFLEX): HIV Screen 4th Generation wRfx: NONREACTIVE

## 2023-12-05 LAB — CBG MONITORING, ED: Glucose-Capillary: 374 mg/dL — ABNORMAL HIGH (ref 70–99)

## 2023-12-05 LAB — PROTIME-INR
INR: 1.1 (ref 0.8–1.2)
Prothrombin Time: 14.3 s (ref 11.4–15.2)

## 2023-12-05 LAB — PROCALCITONIN: Procalcitonin: <0.1 ng/mL

## 2023-12-05 LAB — PREGNANCY, URINE: Preg Test, Ur: NEGATIVE

## 2023-12-05 LAB — TROPONIN T, HIGH SENSITIVITY: Troponin T High Sensitivity: <15 ng/L (ref 0–19)

## 2023-12-05 LAB — MRSA NEXT GEN BY PCR, NASAL: MRSA by PCR Next Gen: NOT DETECTED

## 2023-12-05 MED ORDER — GUAIFENESIN ER 600 MG PO TB12: 1200.0000 mg | ORAL_TABLET | Freq: Two times a day (BID) | ORAL | Status: DC | PRN

## 2023-12-05 MED ORDER — FESOTERODINE FUMARATE ER 4 MG PO TB24: 4.0000 mg | ORAL_TABLET | Freq: Every day | ORAL | Status: DC

## 2023-12-05 MED ORDER — FLUTICASONE PROPIONATE 50 MCG/ACT NA SUSP
2.0000 | Freq: Every day | NASAL | Status: DC
  Administered 2023-12-05 – 2023-12-13 (×8): 2 via NASAL
  Filled 2023-12-05: qty 16

## 2023-12-05 MED ORDER — ACETAMINOPHEN 325 MG PO TABS
650.0000 mg | ORAL_TABLET | ORAL | Status: DC | PRN
  Administered 2023-12-05 – 2023-12-11 (×10): 650 mg via ORAL
  Filled 2023-12-05 (×11): qty 2

## 2023-12-05 MED ORDER — LORATADINE 10 MG PO TABS
10.0000 mg | ORAL_TABLET | Freq: Every day | ORAL | Status: DC
  Administered 2023-12-05 – 2023-12-13 (×9): 10 mg via ORAL
  Filled 2023-12-05 (×9): qty 1

## 2023-12-05 MED ORDER — PANTOPRAZOLE SODIUM 40 MG PO TBEC
40.0000 mg | DELAYED_RELEASE_TABLET | Freq: Every day | ORAL | Status: DC
  Administered 2023-12-05 – 2023-12-13 (×9): 40 mg via ORAL
  Filled 2023-12-05 (×9): qty 1

## 2023-12-05 MED ORDER — SODIUM CHLORIDE 0.9 % IV SOLN
1.0000 g | INTRAVENOUS | Status: DC
  Filled 2023-12-05: qty 10

## 2023-12-05 MED ORDER — CHLORHEXIDINE GLUCONATE CLOTH 2 % EX PADS
6.0000 | MEDICATED_PAD | Freq: Every day | CUTANEOUS | Status: DC
  Administered 2023-12-05 – 2023-12-11 (×7): 6 via TOPICAL
  Filled 2023-12-05: qty 6

## 2023-12-05 MED ORDER — IPRATROPIUM-ALBUTEROL 0.5-2.5 (3) MG/3ML IN SOLN
3.0000 mL | Freq: Four times a day (QID) | RESPIRATORY_TRACT | Status: DC
  Administered 2023-12-05 (×3): 3 mL via RESPIRATORY_TRACT
  Filled 2023-12-05 (×3): qty 3

## 2023-12-05 MED ORDER — PAROXETINE HCL 10 MG PO TABS
10.0000 mg | ORAL_TABLET | Freq: Every day | ORAL | Status: DC
  Administered 2023-12-05 – 2023-12-12 (×8): 10 mg via ORAL
  Filled 2023-12-05 (×8): qty 1

## 2023-12-05 MED ORDER — METHYLPREDNISOLONE SODIUM SUCC 125 MG IJ SOLR
125.0000 mg | Freq: Once | INTRAMUSCULAR | Status: AC
  Administered 2023-12-05: 125 mg via INTRAVENOUS
  Filled 2023-12-05: qty 2

## 2023-12-05 MED ORDER — METHYLPREDNISOLONE SODIUM SUCC 125 MG IJ SOLR
60.0000 mg | Freq: Two times a day (BID) | INTRAMUSCULAR | Status: DC
  Administered 2023-12-05 (×2): 60 mg via INTRAVENOUS
  Filled 2023-12-05 (×2): qty 2

## 2023-12-05 MED ORDER — GUAIFENESIN ER 600 MG PO TB12
1200.0000 mg | ORAL_TABLET | Freq: Two times a day (BID) | ORAL | Status: DC
  Administered 2023-12-05 – 2023-12-13 (×17): 1200 mg via ORAL
  Filled 2023-12-05 (×17): qty 2

## 2023-12-05 MED ORDER — CLONAZEPAM 0.5 MG PO TABS
0.5000 mg | ORAL_TABLET | Freq: Two times a day (BID) | ORAL | Status: DC | PRN
  Administered 2023-12-05 – 2023-12-10 (×10): 0.5 mg via ORAL
  Filled 2023-12-05 (×13): qty 1

## 2023-12-05 MED ORDER — INSULIN GLARGINE-YFGN 100 UNIT/ML ~~LOC~~ SOPN: 20.0000 [IU] | PEN_INJECTOR | SUBCUTANEOUS | Status: DC

## 2023-12-05 MED ORDER — CLONAZEPAM 0.5 MG PO TABS
0.5000 mg | ORAL_TABLET | Freq: Every day | ORAL | Status: DC | PRN
  Administered 2023-12-05: 0.5 mg via ORAL
  Filled 2023-12-05: qty 1

## 2023-12-05 MED ORDER — IPRATROPIUM-ALBUTEROL 0.5-2.5 (3) MG/3ML IN SOLN: 3.0000 mL | Freq: Three times a day (TID) | RESPIRATORY_TRACT | Status: DC

## 2023-12-05 MED ORDER — ARFORMOTEROL TARTRATE 15 MCG/2ML IN NEBU
15.0000 ug | INHALATION_SOLUTION | Freq: Two times a day (BID) | RESPIRATORY_TRACT | Status: DC
  Administered 2023-12-05 (×2): 15 ug via RESPIRATORY_TRACT
  Filled 2023-12-05 (×2): qty 2

## 2023-12-05 MED ORDER — ALBUTEROL SULFATE (2.5 MG/3ML) 0.083% IN NEBU
2.5000 mg | INHALATION_SOLUTION | RESPIRATORY_TRACT | Status: DC | PRN
  Administered 2023-12-05 – 2023-12-06 (×2): 2.5 mg via RESPIRATORY_TRACT
  Filled 2023-12-05 (×2): qty 3

## 2023-12-05 MED ORDER — LEVOTHYROXINE SODIUM 75 MCG PO TABS
75.0000 ug | ORAL_TABLET | Freq: Every day | ORAL | Status: DC
  Administered 2023-12-06 – 2023-12-13 (×8): 75 ug via ORAL
  Filled 2023-12-05 (×9): qty 1

## 2023-12-05 MED ORDER — IPRATROPIUM-ALBUTEROL 0.5-2.5 (3) MG/3ML IN SOLN
3.0000 mL | Freq: Once | RESPIRATORY_TRACT | Status: AC
  Administered 2023-12-05: 3 mL via RESPIRATORY_TRACT
  Filled 2023-12-05: qty 3

## 2023-12-05 MED ORDER — PROCHLORPERAZINE EDISYLATE 10 MG/2ML IJ SOLN
10.0000 mg | Freq: Once | INTRAMUSCULAR | Status: AC
  Administered 2023-12-05: 10 mg via INTRAVENOUS
  Filled 2023-12-05: qty 2

## 2023-12-05 MED ORDER — SODIUM CHLORIDE 0.9 % IV SOLN
2.0000 g | INTRAVENOUS | Status: DC
  Administered 2023-12-05: 2 g via INTRAVENOUS
  Filled 2023-12-05: qty 20

## 2023-12-05 MED ORDER — GUAIFENESIN ER 600 MG PO TB12: 600.0000 mg | ORAL_TABLET | Freq: Two times a day (BID) | ORAL | Status: DC | PRN

## 2023-12-05 MED ORDER — INSULIN ASPART 100 UNIT/ML IJ SOLN
0.0000 [IU] | Freq: Three times a day (TID) | INTRAMUSCULAR | Status: DC
  Administered 2023-12-05: 15 [IU] via SUBCUTANEOUS
  Administered 2023-12-05: 11 [IU] via SUBCUTANEOUS
  Administered 2023-12-06: 20 [IU] via SUBCUTANEOUS
  Administered 2023-12-06: 11 [IU] via SUBCUTANEOUS
  Administered 2023-12-06: 7 [IU] via SUBCUTANEOUS
  Administered 2023-12-07: 15 [IU] via SUBCUTANEOUS
  Administered 2023-12-07: 4 [IU] via SUBCUTANEOUS
  Administered 2023-12-07 – 2023-12-08 (×2): 15 [IU] via SUBCUTANEOUS
  Administered 2023-12-08 (×2): 20 [IU] via SUBCUTANEOUS
  Administered 2023-12-09: 15 [IU] via SUBCUTANEOUS
  Administered 2023-12-09: 20 [IU] via SUBCUTANEOUS
  Administered 2023-12-09: 15 [IU] via SUBCUTANEOUS
  Administered 2023-12-10: 11 [IU] via SUBCUTANEOUS
  Administered 2023-12-10 (×2): 7 [IU] via SUBCUTANEOUS
  Administered 2023-12-11: 3 [IU] via SUBCUTANEOUS
  Administered 2023-12-11 (×2): 15 [IU] via SUBCUTANEOUS
  Administered 2023-12-12: 3 [IU] via SUBCUTANEOUS
  Administered 2023-12-12 (×2): 20 [IU] via SUBCUTANEOUS
  Administered 2023-12-13: 3 [IU] via SUBCUTANEOUS

## 2023-12-05 MED ORDER — SODIUM CHLORIDE 0.9 % IV SOLN: INTRAVENOUS | Status: AC

## 2023-12-05 MED ORDER — BUDESONIDE 0.25 MG/2ML IN SUSP: 0.2500 mg | Freq: Two times a day (BID) | RESPIRATORY_TRACT | Status: DC

## 2023-12-05 MED ORDER — SODIUM CHLORIDE 0.9 % IV SOLN
500.0000 mg | INTRAVENOUS | Status: DC
  Administered 2023-12-05: 500 mg via INTRAVENOUS
  Filled 2023-12-05: qty 5

## 2023-12-05 MED ORDER — INSULIN ASPART 100 UNIT/ML IJ SOLN
0.0000 [IU] | Freq: Every day | INTRAMUSCULAR | Status: DC
  Administered 2023-12-05 – 2023-12-06 (×2): 2 [IU] via SUBCUTANEOUS
  Administered 2023-12-07: 4 [IU] via SUBCUTANEOUS
  Administered 2023-12-08: 5 [IU] via SUBCUTANEOUS
  Administered 2023-12-09: 3 [IU] via SUBCUTANEOUS
  Administered 2023-12-11: 4 [IU] via SUBCUTANEOUS
  Administered 2023-12-12: 3 [IU] via SUBCUTANEOUS

## 2023-12-05 MED ORDER — ROSUVASTATIN CALCIUM 10 MG PO TABS
5.0000 mg | ORAL_TABLET | Freq: Every day | ORAL | Status: DC
  Administered 2023-12-05 – 2023-12-13 (×9): 5 mg via ORAL
  Filled 2023-12-05 (×9): qty 1

## 2023-12-05 MED ORDER — ENOXAPARIN SODIUM 40 MG/0.4ML IJ SOSY
40.0000 mg | PREFILLED_SYRINGE | INTRAMUSCULAR | Status: DC
  Administered 2023-12-05 – 2023-12-13 (×9): 40 mg via SUBCUTANEOUS
  Filled 2023-12-05 (×10): qty 0.4

## 2023-12-05 MED ORDER — INSULIN ASPART 100 UNIT/ML IJ SOLN
24.0000 [IU] | Freq: Once | INTRAMUSCULAR | Status: AC
  Administered 2023-12-05: 24 [IU] via SUBCUTANEOUS

## 2023-12-05 MED ORDER — ORAL CARE MOUTH RINSE
15.0000 mL | OROMUCOSAL | Status: DC | PRN
  Administered 2023-12-05: 15 mL via OROMUCOSAL
  Filled 2023-12-05: qty 15

## 2023-12-05 MED ORDER — BUDESONIDE 0.5 MG/2ML IN SUSP
0.5000 mg | Freq: Two times a day (BID) | RESPIRATORY_TRACT | Status: DC
  Administered 2023-12-05 – 2023-12-13 (×17): 0.5 mg via RESPIRATORY_TRACT
  Filled 2023-12-05 (×17): qty 2

## 2023-12-05 MED ORDER — PROPRANOLOL HCL 20 MG PO TABS
20.0000 mg | ORAL_TABLET | Freq: Three times a day (TID) | ORAL | Status: DC
  Administered 2023-12-05 – 2023-12-13 (×25): 20 mg via ORAL
  Filled 2023-12-05 (×24): qty 1

## 2023-12-05 MED ORDER — CYCLOBENZAPRINE HCL 10 MG PO TABS
10.0000 mg | ORAL_TABLET | Freq: Three times a day (TID) | ORAL | Status: DC | PRN
Start: 2023-12-05 — End: 2023-12-05

## 2023-12-05 MED ORDER — AMLODIPINE BESYLATE 5 MG PO TABS
5.0000 mg | ORAL_TABLET | Freq: Every day | ORAL | Status: DC
  Administered 2023-12-05 – 2023-12-08 (×4): 5 mg via ORAL
  Filled 2023-12-05 (×4): qty 1

## 2023-12-05 MED ORDER — LAMOTRIGINE 25 MG PO TABS
150.0000 mg | ORAL_TABLET | Freq: Every day | ORAL | Status: DC
  Administered 2023-12-05 – 2023-12-12 (×8): 150 mg via ORAL
  Filled 2023-12-05 (×9): qty 2

## 2023-12-05 MED ORDER — ORAL CARE MOUTH RINSE
15.0000 mL | OROMUCOSAL | Status: DC
  Administered 2023-12-05 – 2023-12-06 (×2): 15 mL via OROMUCOSAL
  Filled 2023-12-05: qty 15

## 2023-12-05 MED ORDER — NICOTINE 21 MG/24HR TD PT24
21.0000 mg | MEDICATED_PATCH | Freq: Every day | TRANSDERMAL | Status: DC
  Administered 2023-12-05 – 2023-12-13 (×9): 21 mg via TRANSDERMAL
  Filled 2023-12-05 (×10): qty 1

## 2023-12-05 MED ORDER — INSULIN GLARGINE-YFGN 100 UNIT/ML ~~LOC~~ SOLN
20.0000 [IU] | Freq: Every day | SUBCUTANEOUS | Status: DC
  Administered 2023-12-05: 20 [IU] via SUBCUTANEOUS
  Filled 2023-12-05 (×2): qty 0.2

## 2023-12-05 NOTE — Progress Notes (Signed)
 Attempted to get report from Marinell Silvan RN at [facility] with no success.  Left phone number to call [phone] told [facility] was placing the patient on a stretcher at that time.

## 2023-12-05 NOTE — Consult Note (Signed)
 NAME:  Emily [name], MRN:  [id number], DOB:  June 25, 1980, LOS: 0 ADMISSION DATE:  12/04/2023, CONSULTATION DATE:  12/05/23 REFERRING MD:  Toribio Hummer, MD CHIEF COMPLAINT:  Pneumonia   History of Present Illness:  Emily [name] is a 43 year old woman, daily smoker with asthma, DMII, hypertension, hypothyroidism, bipolar disorder, cirrhosis and lupus who is admitted for acute hypoxemic respiratory failure due to pneumonia.   She reports 1 week of progressive dyspnea, cough and wheezing. She was treated with Zpak and prednisone  at an Urgent Care recently but her symptoms did not improve after being on therapy for 2 days.   She is followed by Dr. Geronimo in pulmonary clinic for asthma and lung nodules. Her asthma regimen includes advair diskus 250-50mcg 1 puff twice daily, montelukast  10mg  daily and as needed albuterol .   She is smoking 1 pack per day. She has history of positive ANA testing.  Pertinent  Medical History   Past Medical History:  Diagnosis Date   Abnormal CT of liver    Anxiety    Asthma    Asthma    Bipolar 1 disorder (HCC)    Bipolar disorder (HCC)    Chronic pain    Cirrhosis (HCC)    Depression    Diabetes mellitus without complication (HCC)    Fatty liver    Gallstones    GERD (gastroesophageal reflux disease)    Headache(784.0)    Hyperlipidemia    Hypertension    Hypothyroidism    Lupus (systemic lupus erythematosus) (HCC)    Pulmonary nodule    Tachycardia    Thyroid disease    Vitamin D  deficiency      Significant Hospital Events: Including procedures, antibiotic start and stop dates in addition to other pertinent events   8/16 admitted for respiratory failure and pneumonia  Interim History / Subjective:   She is feeling better compared to yesterday Oxygen requirements decreased from 15L to 10L this morning Family at bedside, reports of viral infection running through multiple family members She is starting to cough up  mucous  Objective    Blood pressure (!) 145/84, pulse 87, temperature 98.3 F (36.8 C), temperature source Oral, resp. rate (!) 28, height 5' 3.5 (1.613 m), weight 82 kg, last menstrual period 10/29/2023, SpO2 90%.    FiO2 (%):  [55 %] 55 %   Intake/Output Summary (Last 24 hours) at 12/05/2023 1038 Last data filed at 12/05/2023 1000 Gross per 24 hour  Intake 1125.65 ml  Output 1200 ml  Net -74.35 ml   Filed Weights   12/05/23 0237  Weight: 82 kg    Examination: General: young woman, no distress, laying in bed HENT: [place]/AT, poor dentition, moist mucous membranes Lungs: diffuse wheezing and inspiratory chirps, poor air movement Cardiovascular: rrr, no murmurs Abdomen: soft, non-tender, non-distended Extremities: warm, no edema Neuro: alert, oriented x 3, moving all extremities GU: n/a  D-dimer negative WBC 15.4 > 11.5 CXR: patchy left lung infiltrates  Resolved problem list   Assessment and Plan  Acute Hypoxemic Respiratory Failure Sepsis due to Community Acquired Pneumonia Asthma with Acute Exacerbation Cigarette Smoker DMII Cirrhosis Hx of Lupus  Patient appears to have acute infectious pneumonia given history of sick contacts with exacerbation of her asthma. Other differential includes autoimmune related ILD given her history of lupus. Low concern for thromboembolic disease given negative ddimer.   Plan: - continue supplemental oxygen for goal SpO2 92% or higher - Continue solumedrol 60mg  twice daily - Continue duonebs q6hours  and as needed albuterol  - continue ceftriaxone  and azithromycin  for CAP coverage - check respiratory viral panel and procal - heavily encouraged smoking cessation - If she does not improve with above therapies, will obtain HRCT chest to evaluate for inflammatory lung disease related to lupus.  Best Practice (right click and Reselect all SmartList Selections daily)   Diet/type: Regular consistency (see orders) DVT prophylaxis  LMWH Pressure ulcer(s): N/A GI prophylaxis: N/A Lines: N/A Foley:  N/A Code Status:  full code Last date of multidisciplinary goals of care discussion [per primary team]  Labs   CBC: Recent Labs  Lab 12/04/23 2257 12/05/23 0541  WBC 15.4* 11.5*  HGB 14.6 13.9  HCT 40.7 40.8  MCV 96.7 99.3  PLT 211 168    Basic Metabolic Panel: Recent Labs  Lab 12/04/23 2257 12/05/23 0541  NA 131* 133*  K 3.5 3.6  CL 95* 98  CO2 22 23  GLUCOSE 474* 388*  BUN 5* 7  CREATININE 0.65 0.61  CALCIUM  9.3 8.6*   GFR: Estimated Creatinine Clearance: 94 mL/min (by C-G formula based on SCr of 0.61 mg/dL). Recent Labs  Lab 12/04/23 2257 12/05/23 0541  WBC 15.4* 11.5*  LATICACIDVEN  --  1.6    Liver Function Tests: Recent Labs  Lab 12/05/23 0541  AST 22  ALT 18  ALKPHOS 113  BILITOT 1.0  PROT 7.9  ALBUMIN 3.3*   No results for input(s): LIPASE, AMYLASE in the last 168 hours. No results for input(s): AMMONIA in the last 168 hours.  ABG    Component Value Date/Time   PHART 7.4 12/05/2023 0818   PCO2ART 46 12/05/2023 0818   PO2ART 65 (L) 12/05/2023 0818   HCO3 28.5 (H) 12/05/2023 0818   O2SAT 94.6 12/05/2023 0818     Coagulation Profile: Recent Labs  Lab 12/05/23 0541  INR 1.1    Cardiac Enzymes: No results for input(s): CKTOTAL, CKMB, CKMBINDEX, TROPONINI in the last 168 hours.  HbA1C: Hgb A1c MFr Bld  Date/Time Value Ref Range Status  05/20/2023 01:06 PM 10.9 (H) 4.8 - 5.6 % Final    Comment:    (NOTE) Pre diabetes:          5.7%-6.4%  Diabetes:              >6.4%  Glycemic control for   <7.0% adults with diabetes   01/16/2023 10:41 AM 11.0 (H) 4.6 - 6.5 % Final    Comment:    Glycemic Control Guidelines for People with Diabetes:Non Diabetic:  <6%Goal of Therapy: <7%Additional Action Suggested:  >8%     CBG: Recent Labs  Lab 12/05/23 0124 12/05/23 0246 12/05/23 0821  GLUCAP 374* 355* 424*    Review of Systems:   Review of  Systems  Constitutional:  Negative for chills, fever, malaise/fatigue and weight loss.  HENT:  Negative for congestion, sinus pain and sore throat.   Eyes: Negative.   Respiratory:  Positive for cough, sputum production, shortness of breath and wheezing. Negative for hemoptysis.   Cardiovascular:  Negative for chest pain, palpitations, orthopnea, claudication and leg swelling.  Gastrointestinal:  Negative for abdominal pain, heartburn, nausea and vomiting.  Genitourinary: Negative.   Musculoskeletal:  Negative for joint pain and myalgias.  Skin:  Negative for rash.  Neurological:  Negative for weakness.  Endo/Heme/Allergies: Negative.   Psychiatric/Behavioral: Negative.     Past Medical History:  She,  has a past medical history of Abnormal CT of liver, Anxiety, Asthma, Asthma, Bipolar 1 disorder (HCC), Bipolar  disorder (HCC), Chronic pain, Cirrhosis (HCC), Depression, Diabetes mellitus without complication (HCC), Fatty liver, Gallstones, GERD (gastroesophageal reflux disease), Headache(784.0), Hyperlipidemia, Hypertension, Hypothyroidism, Lupus (systemic lupus erythematosus) (HCC), Pulmonary nodule, Tachycardia, Thyroid disease, and Vitamin D  deficiency.   Surgical History:   Past Surgical History:  Procedure Laterality Date   DILATION AND CURETTAGE OF UTERUS     1 yr ago   UPPER GI ENDOSCOPY  08/28/2022     Social History:   reports that she has been smoking cigarettes. She has a 20 pack-year smoking history. She has never been exposed to tobacco smoke. She has never used smokeless tobacco. She reports that she does not drink alcohol and does not use drugs.   Family History:  Her family history includes Asthma in her mother; Diabetes in her maternal grandmother; Hypertension in her maternal grandmother; Valvular heart disease in her father.   Allergies Allergies  Allergen Reactions   Bactrim  [Sulfamethoxazole -Trimethoprim ] Anaphylaxis and Other (See Comments)    Chest pain,  trouble breathing   Ciprofloxacin Palpitations   Clindamycin /Lincomycin     Other reaction(s): Chest Pain   Flagyl  [Metronidazole ] Shortness Of Breath   Hydrocodone Other (See Comments)   Iodinated Contrast Media Swelling   Ondansetron Shortness Of Breath   Liraglutide  Nausea And Vomiting   Doxycycline  Nausea And Vomiting   Penicillins    Synjardy  Xr [Empagliflozin-Metformin  Hcl Er]      Home Medications  Prior to Admission medications   Medication Sig Start Date End Date Taking? Authorizing Provider  ACCU-CHEK AVIVA PLUS test strip 1 each by Other route 3 (three) times daily. 04/28/20   [provider]  ACCU-CHEK FASTCLIX LANCETS MISC USE AS DIRECTED 12/10/17   Tysinger, Alm RAMAN, PA-C  albuterol  (VENTOLIN  HFA) 108 (90 Base) MCG/ACT inhaler Inhale 2 puffs into the lungs every 6 (six) hours as needed for wheezing or shortness of breath. 08/05/23   Ruthell Lauraine FALCON, NP  amLODipine  (NORVASC ) 5 MG tablet TAKE 1 TABLET (5 MG TOTAL) BY MOUTH DAILY. 03/04/23   Lavona Agent, MD  azithromycin  (ZITHROMAX ) 250 MG tablet Take 2 tablets on day 1, then 1 tablet daily on days 2 through 5 11/30/23 12/05/23  Vivienne Delon HERO, PA-C  benzonatate  (TESSALON ) 100 MG capsule Take 1 capsule (100 mg total) by mouth 3 (three) times daily as needed for cough. 12/03/23   Gladis Elsie BROCKS, PA-C  Blood Glucose Monitoring Suppl (ACCU-CHEK AVIVA PLUS) w/Device KIT USE AS DIRECTED 05/01/16   Tysinger, Alm RAMAN, PA-C  Blood Glucose Monitoring Suppl DEVI 1 each by Does not apply route in the morning, at noon, and at bedtime. May substitute to any manufacturer covered by patient's insurance. 01/22/23   Motwani, Obadiah, MD  clonazePAM  (KLONOPIN ) 1 MG tablet Take 1 mg by mouth 2 (two) times daily.    [provider]  Continuous Glucose Receiver (DEXCOM G7 RECEIVER) DEVI 1 Device by Does not apply route continuous. 01/22/23   Motwani, Komal, MD  Continuous Glucose Sensor (DEXCOM G7 SENSOR) MISC USE AS DIRECTED  08/31/23   Motwani, Obadiah, MD  cyclobenzaprine  (FLEXERIL ) 10 MG tablet Take 1 tablet (10 mg total) by mouth 3 (three) times daily as needed. 09/20/23   Geroldine Berg, MD  fluticasone  (FLONASE ) 50 MCG/ACT nasal spray Place 2 sprays into both nostrils daily. 12/03/23   Gladis Elsie BROCKS, PA-C  fluticasone -salmeterol (ADVAIR) 250-50 MCG/ACT AEPB Inhale 1 puff into the lungs every 12 (twelve) hours. Rinse after use. 07/22/22   Parrett, Madelin RAMAN, NP  insulin  lispro (  HUMALOG  KWIKPEN) 100 UNIT/ML KwikPen Max daily 45 units 07/03/23   Shamleffer, Ibtehal Jaralla, MD  Insulin  Pen Needle 32G X 4 MM MISC 1 Device by Does not apply route in the morning, at noon, in the evening, and at bedtime. 07/03/23   Shamleffer, Ibtehal Jaralla, MD  ipratropium (ATROVENT ) 0.03 % nasal spray Place 2 sprays into both nostrils every 12 (twelve) hours. 09/16/23   Gladis Elsie BROCKS, PA-C  ipratropium-albuterol  (DUONEB) 0.5-2.5 (3) MG/3ML SOLN Take 3 mLs by nebulization in the morning, at noon, and at bedtime. 12/03/23   Geronimo Amel, MD  lamoTRIgine  (LAMICTAL ) 150 MG tablet Take 150 mg by mouth daily.    [provider]  levothyroxine  (SYNTHROID ) 75 MCG tablet TAKE 1 TABLET BY MOUTH EVERY DAY BEFORE BREAKFAST 09/21/23   Williamson, Joanna R, NP  metFORMIN  (GLUCOPHAGE -XR) 500 MG 24 hr tablet Take 1 tablet (500 mg total) by mouth 2 (two) times daily with a meal. 09/21/23   Motwani, Komal, MD  montelukast  (SINGULAIR ) 10 MG tablet TAKE 1 TABLET BY MOUTH EVERYDAY AT BEDTIME 07/20/23   Parrett, Madelin RAMAN, NP  naproxen  (NAPROSYN ) 500 MG tablet Take 1 tablet (500 mg total) by mouth 2 (two) times daily with a meal. 05/15/23   Kennyth Domino, FNP  Nebulizers (COMPRESSOR/NEBULIZER) MISC Use as directed 11/15/19   Geronimo Amel, MD  nitroGLYCERIN  (NITROSTAT ) 0.4 MG SL tablet Place 1 tablet (0.4 mg total) under the tongue every 5 (five) minutes as needed for chest pain. 03/10/22   Nahser, Aleene PARAS, MD  nystatin  cream (MYCOSTATIN ) Apply to  affected area 2 times daily 07/06/23   Cleotilde Rogue, MD  omeprazole  (PRILOSEC) 40 MG capsule Take 40 mg by mouth every morning. 08/05/21   [provider]  PARoxetine  (PAXIL ) 10 MG tablet Take 10 mg by mouth at bedtime. 05/27/23   [provider]  promethazine  (PHENERGAN ) 12.5 MG tablet Take 12.5 mg by mouth every 8 (eight) hours as needed for vomiting or nausea. 09/24/21   [provider]  promethazine -dextromethorphan  (PROMETHAZINE -DM) 6.25-15 MG/5ML syrup Take 5 mLs by mouth 4 (four) times daily as needed. 11/20/23   Vivienne Delon HERO, PA-C  propranolol  (INDERAL ) 20 MG tablet Take 1 tablet (20 mg total) by mouth 3 (three) times daily. 03/31/23   Lavona Agent, MD  pseudoephedrine  (SUDAFED) 120 MG 12 hr tablet Take 1 tablet (120 mg total) by mouth 2 (two) times daily. 06/11/23   Lucienne Loveless, PA-C  Respiratory Therapy Supplies (NEBULIZER/TUBING/MOUTHPIECE) KIT Use as directed 11/15/19   Geronimo Amel, MD  rosuvastatin  (CRESTOR ) 5 MG tablet Take 1 tablet (5 mg total) by mouth daily. 07/23/23   Motwani, Komal, MD  solifenacin  (VESICARE ) 5 MG tablet Take 1 tablet (5 mg total) by mouth daily. 07/02/23 07/01/24  Signa Delon LABOR, NP  Vitamin D , Ergocalciferol , (DRISDOL ) 1.25 MG (50000 UNIT) CAPS capsule Take 50,000 Units by mouth every 7 (seven) days. 07/23/23   [provider]  ziprasidone  (GEODON ) 60 MG capsule Take 60 mg by mouth at bedtime. 05/22/20   [provider]     Critical care time: n/a    Dorn Chill, MD [facility] Office: [phone]   See Amion for personal pager PCCM on call pager [phone] until 7pm. Please call Elink 7p-7a. [phone]

## 2023-12-05 NOTE — ED Notes (Signed)
-  Called [facility] at 116am for transportation to [facility].

## 2023-12-05 NOTE — Progress Notes (Signed)
 Plan of Care Note for accepted transfer   Patient: Emily [name] MRN: [id number]   DOA: 12/04/2023  Facility requesting transfer: [facility]   Requesting Provider: Dr. Haze   Reason for transfer: Pneumonia, acute hypoxic respiratory failure   Facility course: 43 yr old female with PMHx that includes HTN, IDDM, depression, anxiety, cirrhosis, and asthma who presents with cough, SOB, and hypoxia despite antibiotics and prednisone .   She is tachypneic and requiring 8 Lpm supplemental O2. Glucose is 473, WBC 15.4, and D-dimer undetectable. CXR concerning for pneumonia.   She was given IV Solu-Medrol , Duoneb, Rocephin , and azithromycin .   Plan of care: The patient is accepted for admission to [facility] unit, at [facility].   Author: Evalene GORMAN Sprinkles, MD 12/05/2023  Check [email or web address] for on-call coverage.  Nursing staff, Please call [facility] Admits [facility]ults [facility]-Wide number on Amion as soon as patient's arrival, so appropriate admitting provider can evaluate the pt.

## 2023-12-05 NOTE — Plan of Care (Signed)
 Discussed with patient plan of care for the evening, pain management and admission questions with some teach back displayed  Problem: Education: Goal: Knowledge of General Education information will improve Description: Including pain rating scale, medication(s)/side effects and non-pharmacologic comfort measures Outcome: Progressing   Problem: Health Behavior/Discharge Planning: Goal: Ability to manage health-related needs will improve Outcome: Progressing   Problem: Coping: Goal: Level of anxiety will decrease Outcome: Not Progressing   Problem: Pain Managment: Goal: General experience of comfort will improve and/or be controlled Outcome: Progressing

## 2023-12-05 NOTE — Progress Notes (Signed)
 Respiratory Viral Panel positive for parainfluenza virus and procalcitonin is not elevated. Will stop antibiotics.  Dorn Chill, MD [facility] Office: [phone]   See Amion for personal pager PCCM on call pager [phone] until 7pm. Please call Elink 7p-7a. [phone]

## 2023-12-05 NOTE — H&P (Signed)
 History and Physical    Emily [name] FMW:[id number] DOB: [birth year] DOA: 12/04/2023  PCP: [name], NP  Patient coming from: [facility] ED  Chief Complaint: Shortness of breath  HPI: Emily [name] is a 43 y.o. female with medical history significant of asthma, chronic bronchitis and rhinitis, heavy cigarette smoking, type 2 diabetes, hypertension, hyperlipidemia, hypothyroidism, lupus, cirrhosis, bipolar disorder, anxiety, depression, GERD presenting with a chief complaint of shortness of breath.  Patient is reporting 1 week history of shortness of breath, productive cough, and wheezing.  She was using her home nebulizer without much improvement.  She was recently seen at urgent care and prescribed azithromycin  and prednisone  which she has taken for 2 days but her symptoms have not improved.  Several other family members are sick with upper respiratory illness.  Denies fevers, chills, or chest pain.  She smokes 1 pack of cigarettes daily.  She is requesting clonazepam  0.5 mg which she takes at home daily as needed for anxiety.  ED Course: Tachypneic and hypoxic to the 80s on room air requiring 15 L HFNC to maintain sats in the 90s.  Afebrile.  Labs notable for WBC count 15.4, sodium 131, chloride 95, glucose 474, bicarb 22, troponin negative x 2, D-dimer negative, COVID/influenza/RSV PCR negative.  Chest x-ray showing patchy left mid and lower lung opacities concerning for pneumonia.  EKG showing sinus rhythm and no acute ischemic changes.  Patient was given DuoNeb, Solu-Medrol  125 mg, Compazine , ceftriaxone , and azithromycin .  Review of Systems:  Review of Systems  All other systems reviewed and are negative.   Past Medical History:  Diagnosis Date   Abnormal CT of liver    Anxiety    Asthma    Asthma    Bipolar 1 disorder (HCC)    Bipolar disorder (HCC)    Chronic pain    Cirrhosis (HCC)    Depression    Diabetes mellitus without complication (HCC)    Fatty liver     Gallstones    GERD (gastroesophageal reflux disease)    Headache(784.0)    Hyperlipidemia    Hypertension    Hypothyroidism    Lupus (systemic lupus erythematosus) (HCC)    Pulmonary nodule    Tachycardia    Thyroid disease    Vitamin D  deficiency     Past Surgical History:  Procedure Laterality Date   DILATION AND CURETTAGE OF UTERUS     1 yr ago   UPPER GI ENDOSCOPY  08/28/2022     reports that she has been smoking cigarettes. She has a 20 pack-year smoking history. She has never been exposed to tobacco smoke. She has never used smokeless tobacco. She reports that she does not drink alcohol and does not use drugs.  Allergies  Allergen Reactions   Bactrim  [Sulfamethoxazole -Trimethoprim ] Anaphylaxis and Other (See Comments)    Chest pain, trouble breathing   Ciprofloxacin Palpitations   Clindamycin /Lincomycin     Other reaction(s): Chest Pain   Flagyl  [Metronidazole ] Shortness Of Breath   Hydrocodone Other (See Comments)   Iodinated Contrast Media Swelling   Ondansetron Shortness Of Breath   Liraglutide  Nausea And Vomiting   Doxycycline  Nausea And Vomiting   Penicillins    Synjardy  Xr [Empagliflozin-Metformin  Hcl Er]     Family History  Problem Relation Age of Onset   Asthma Mother    Valvular heart disease Father        Mitral valve repair Dr. Dusty 2007   Diabetes Maternal Grandmother    Hypertension Maternal  Grandmother     Prior to Admission medications   Medication Sig Start Date End Date Taking? Authorizing Provider  ACCU-CHEK AVIVA PLUS test strip 1 each by Other route 3 (three) times daily. 04/28/20   [provider]  ACCU-CHEK FASTCLIX LANCETS MISC USE AS DIRECTED 12/10/17   Tysinger, Alm RAMAN, PA-C  albuterol  (VENTOLIN  HFA) 108 (90 Base) MCG/ACT inhaler Inhale 2 puffs into the lungs every 6 (six) hours as needed for wheezing or shortness of breath. 08/05/23   Ruthell Lauraine FALCON, NP  amLODipine  (NORVASC ) 5 MG tablet TAKE 1 TABLET (5 MG TOTAL) BY MOUTH  DAILY. 03/04/23   Lavona Agent, MD  azithromycin  (ZITHROMAX ) 250 MG tablet Take 2 tablets on day 1, then 1 tablet daily on days 2 through 5 11/30/23 12/05/23  Vivienne Delon HERO, PA-C  benzonatate  (TESSALON ) 100 MG capsule Take 1 capsule (100 mg total) by mouth 3 (three) times daily as needed for cough. 12/03/23   Gladis Elsie BROCKS, PA-C  Blood Glucose Monitoring Suppl (ACCU-CHEK AVIVA PLUS) w/Device KIT USE AS DIRECTED 05/01/16   Tysinger, Alm RAMAN, PA-C  Blood Glucose Monitoring Suppl DEVI 1 each by Does not apply route in the morning, at noon, and at bedtime. May substitute to any manufacturer covered by patient's insurance. 01/22/23   Motwani, Komal, MD  clonazePAM  (KLONOPIN ) 1 MG tablet Take 1 mg by mouth 2 (two) times daily.    [provider]  Continuous Glucose Receiver (DEXCOM G7 RECEIVER) DEVI 1 Device by Does not apply route continuous. 01/22/23   Motwani, Komal, MD  Continuous Glucose Sensor (DEXCOM G7 SENSOR) MISC USE AS DIRECTED 08/31/23   Motwani, Obadiah, MD  cyclobenzaprine  (FLEXERIL ) 10 MG tablet Take 1 tablet (10 mg total) by mouth 3 (three) times daily as needed. 09/20/23   Geroldine Berg, MD  fluticasone  (FLONASE ) 50 MCG/ACT nasal spray Place 2 sprays into both nostrils daily. 12/03/23   Gladis Elsie BROCKS, PA-C  fluticasone -salmeterol (ADVAIR) 250-50 MCG/ACT AEPB Inhale 1 puff into the lungs every 12 (twelve) hours. Rinse after use. 07/22/22   Parrett, Madelin RAMAN, NP  insulin  lispro (HUMALOG  KWIKPEN) 100 UNIT/ML KwikPen Max daily 45 units 07/03/23   Shamleffer, Ibtehal Jaralla, MD  Insulin  Pen Needle 32G X 4 MM MISC 1 Device by Does not apply route in the morning, at noon, in the evening, and at bedtime. 07/03/23   Shamleffer, Ibtehal Jaralla, MD  ipratropium (ATROVENT ) 0.03 % nasal spray Place 2 sprays into both nostrils every 12 (twelve) hours. 09/16/23   Gladis Elsie BROCKS, PA-C  ipratropium-albuterol  (DUONEB) 0.5-2.5 (3) MG/3ML SOLN Take 3 mLs by nebulization in the morning, at noon,  and at bedtime. 12/03/23   Geronimo Amel, MD  lamoTRIgine  (LAMICTAL ) 150 MG tablet Take 150 mg by mouth daily.    [provider]  levothyroxine  (SYNTHROID ) 75 MCG tablet TAKE 1 TABLET BY MOUTH EVERY DAY BEFORE BREAKFAST 09/21/23   Williamson, Joanna R, NP  metFORMIN  (GLUCOPHAGE -XR) 500 MG 24 hr tablet Take 1 tablet (500 mg total) by mouth 2 (two) times daily with a meal. 09/21/23   Motwani, Komal, MD  montelukast  (SINGULAIR ) 10 MG tablet TAKE 1 TABLET BY MOUTH EVERYDAY AT BEDTIME 07/20/23   Parrett, [name] S, NP  naproxen  (NAPROSYN ) 500 MG tablet Take 1 tablet (500 mg total) by mouth 2 (two) times daily with a meal. 05/15/23   Kennyth Lauraine, FNP  Nebulizers (COMPRESSOR/NEBULIZER) MISC Use as directed 11/15/19   Geronimo Amel, MD  nitroGLYCERIN  (NITROSTAT ) 0.4 MG SL tablet Place  1 tablet (0.4 mg total) under the tongue every 5 (five) minutes as needed for chest pain. 03/10/22   Nahser, Aleene PARAS, MD  nystatin  cream (MYCOSTATIN ) Apply to affected area 2 times daily 07/06/23   Cleotilde Rogue, MD  omeprazole  (PRILOSEC) 40 MG capsule Take 40 mg by mouth every morning. 08/05/21   [provider]  PARoxetine  (PAXIL ) 10 MG tablet Take 10 mg by mouth at bedtime. 05/27/23   [provider]  promethazine  (PHENERGAN ) 12.5 MG tablet Take 12.5 mg by mouth every 8 (eight) hours as needed for vomiting or nausea. 09/24/21   [provider]  promethazine -dextromethorphan  (PROMETHAZINE -DM) 6.25-15 MG/5ML syrup Take 5 mLs by mouth 4 (four) times daily as needed. 11/20/23   Vivienne Delon HERO, PA-C  propranolol  (INDERAL ) 20 MG tablet Take 1 tablet (20 mg total) by mouth 3 (three) times daily. 03/31/23   Lavona Agent, MD  pseudoephedrine  (SUDAFED) 120 MG 12 hr tablet Take 1 tablet (120 mg total) by mouth 2 (two) times daily. 06/11/23   Lucienne Loveless, PA-C  Respiratory Therapy Supplies (NEBULIZER/TUBING/MOUTHPIECE) KIT Use as directed 11/15/19   Geronimo Amel, MD  rosuvastatin  (CRESTOR ) 5  MG tablet Take 1 tablet (5 mg total) by mouth daily. 07/23/23   Motwani, Komal, MD  solifenacin  (VESICARE ) 5 MG tablet Take 1 tablet (5 mg total) by mouth daily. 07/02/23 07/01/24  Signa Delon LABOR, NP  Vitamin D , Ergocalciferol , (DRISDOL ) 1.25 MG (50000 UNIT) CAPS capsule Take 50,000 Units by mouth every 7 (seven) days. 07/23/23   [provider]  ziprasidone  (GEODON ) 60 MG capsule Take 60 mg by mouth at bedtime. 05/22/20   [provider]    Physical Exam: Vitals:   12/05/23 0115 12/05/23 0130 12/05/23 0237 12/05/23 0243  BP: 135/79 134/85 (!) 153/94 (!) 147/89  Pulse: 79 80 84 80  Resp: (!) 28 (!) 22 (!) 23 (!) 26  Temp:   98.2 F (36.8 C)   TempSrc:   Oral   SpO2: 91% 91% 92% 97%  Weight:   82 kg   Height:   5' 3.5 (1.613 m)     Physical Exam Vitals reviewed.  Constitutional:      General: She is not in acute distress. HENT:     Head: Normocephalic and atraumatic.  Eyes:     Extraocular Movements: Extraocular movements intact.  Cardiovascular:     Rate and Rhythm: Normal rate and regular rhythm.     Heart sounds: Normal heart sounds.  Pulmonary:     Effort: No respiratory distress.     Breath sounds: Wheezing and rhonchi present.  Abdominal:     General: Bowel sounds are normal. There is no distension.     Palpations: Abdomen is soft.     Tenderness: There is no abdominal tenderness. There is no guarding.  Musculoskeletal:     Cervical back: Normal range of motion.     Right lower leg: No edema.     Left lower leg: No edema.  Skin:    General: Skin is warm and dry.  Neurological:     General: No focal deficit present.     Mental Status: She is alert and oriented to person, place, and time.     Labs on Admission: I have personally reviewed following labs and imaging studies  CBC: Recent Labs  Lab 12/04/23 2257  WBC 15.4*  HGB 14.6  HCT 40.7  MCV 96.7  PLT 211   Basic Metabolic Panel: Recent Labs  Lab 12/04/23 2257  NA 131*  K 3.5   CL 95*  CO2 22  GLUCOSE 474*  BUN 5*  CREATININE 0.65  CALCIUM  9.3   GFR: Estimated Creatinine Clearance: 94 mL/min (by C-G formula based on SCr of 0.65 mg/dL). Liver Function Tests: No results for input(s): AST, ALT, ALKPHOS, BILITOT, PROT, ALBUMIN in the last 168 hours. No results for input(s): LIPASE, AMYLASE in the last 168 hours. No results for input(s): AMMONIA in the last 168 hours. Coagulation Profile: No results for input(s): INR, PROTIME in the last 168 hours. Cardiac Enzymes: No results for input(s): CKTOTAL, CKMB, CKMBINDEX, TROPONINI in the last 168 hours. BNP (last 3 results) No results for input(s): PROBNP in the last 8760 hours. HbA1C: No results for input(s): HGBA1C in the last 72 hours. CBG: Recent Labs  Lab 12/05/23 0124 12/05/23 0246  GLUCAP 374* 355*   Lipid Profile: No results for input(s): CHOL, HDL, LDLCALC, TRIG, CHOLHDL, LDLDIRECT in the last 72 hours. Thyroid Function Tests: No results for input(s): TSH, T4TOTAL, FREET4, T3FREE, THYROIDAB in the last 72 hours. Anemia Panel: No results for input(s): VITAMINB12, FOLATE, FERRITIN, TIBC, IRON, RETICCTPCT in the last 72 hours. Urine analysis:    Component Value Date/Time   COLORURINE YELLOW 08/28/2023 0251   APPEARANCEUR CLEAR 08/28/2023 0251   APPEARANCEUR Clear 06/09/2023 1544   LABSPEC 1.005 08/28/2023 0251   PHURINE 6.5 08/28/2023 0251   GLUCOSEU CANCELED 11/19/2023 0417   GLUCOSEU NEGATIVE 05/24/2007 1539   HGBUR NEGATIVE 08/28/2023 0251   BILIRUBINUR NEGATIVE 08/28/2023 0251   BILIRUBINUR Negative 06/09/2023 1544   KETONESUR NEGATIVE 08/28/2023 0251   PROTEINUR Negative 11/19/2023 1402   PROTEINUR CANCELED 11/19/2023 0417   PROTEINUR NEGATIVE 08/28/2023 0251   UROBILINOGEN 0.2 12/29/2022 1608   UROBILINOGEN 0.2 12/28/2007 1602   NITRITE neg 11/19/2023 1402   NITRITE NEGATIVE 08/28/2023 0251   LEUKOCYTESUR Large (3+)  (A) 11/19/2023 1402   LEUKOCYTESUR MODERATE (A) 08/28/2023 0251    Radiological Exams on Admission: DG Chest Portable 1 View Result Date: 12/05/2023 CLINICAL DATA:  Chest pain EXAM: PORTABLE CHEST 1 VIEW COMPARISON:  11/03/2023 FINDINGS: Heart and mediastinal contours within normal limits. Right lung clear. Patchy left mid and lower lung opacities could reflect early infiltrate. No effusions. No acute bony abnormality. IMPRESSION: Patchy left mid and lower lung opacities concerning for pneumonia. Electronically Signed   By: Franky Crease M.D.   On: 12/05/2023 00:14    Assessment and Plan  Severe sepsis secondary to community-acquired pneumonia Meets criteria for severe sepsis with tachypnea, leukocytosis, and acute hypoxemia.  Not hypotensive.  Chest x-ray showing patchy left mid and lower lung opacities concerning for pneumonia.  COVID/influenza/RSV PCR negative.  Continue ceftriaxone  and azithromycin .  MRSA PCR screen, strep pneumo and Legionella urinary antigens, sputum Gram stain and culture.  Check lactic acid and blood cultures ordered.  Trend WBC count.  Acute exacerbation of asthma/chronic bronchitis Has wheezing and rhonchi on exam.  Continue treatment with Solu-Medrol  60 mg every 12 hours, DuoNeb every 6 hours, Pulmicort  neb twice daily, albuterol  neb every 4 hours PRN, and Mucinex  PRN.  Acute hypoxemic respiratory failure In the setting of problems listed above.  Oxygen saturation in the 80s on room air and currently requiring 15 L HFNC to maintain sats in the 90s.  Mildly tachypneic, no respiratory distress.  Continue supplemental oxygen, wean as tolerated.  Mild hyponatremia Likely due to poor p.o. intake in the setting of acute illness.  IV fluid hydration with normal saline and monitor sodium level.  Cigarette smoking Counseled to quit and started on nicotine  patch.  Poorly controlled insulin -dependent type 2 diabetes Glucose initially 474 in the setting of steroid use but no  signs of DKA.  Most recent CBG 355.  Last A1c 10.9 in January 2025, repeat ordered.  Placed on resistant sliding scale insulin  ACHS in setting of steroid use.  Continue home long-acting insulin  after pharmacy med rec is done.  Cirrhosis No signs of ascites or hepatic encephalopathy at this time.  Check LFTs, PT/INR.  Bipolar disorder, anxiety, depression Ordered home clonazepam  PRN per patient request.  Continue remainder of home meds after pharmacy med rec is done.  Hypertension: Currently normotensive. Hyperlipidemia Hypothyroidism SLE GERD Pharmacy med rec pending.  DVT prophylaxis: Lovenox  Code Status: Full Code (discussed with the patient) Family Communication: Patient's mother at bedside. Level of care: Step Down Unit Admission status: It is my clinical opinion that admission to INPATIENT is reasonable and necessary because of the expectation that this patient will require hospital care that crosses at least 2 midnights to treat this condition based on the medical complexity of the problems presented.  Given the aforementioned information, the predictability of an adverse outcome is felt to be significant.  Editha Ram MD [facility]  If 7PM-7AM, please contact night-coverage [email or web address]  12/05/2023, 3:16 AM

## 2023-12-05 NOTE — Progress Notes (Addendum)
 PROGRESS NOTE    Emily [name]  FMW:[id number] DOB: Mar 27, 1981 DOA: 12/04/2023 PCP: [name], NP    Chief Complaint  Patient presents with   Chest Pain   Shortness of Breath    Brief Narrative:  Patient 43 year old female with history of asthma, chronic bronchitis and rhinitis, ongoing tobacco abuse, type 2 diabetes, hypertension, hyperlipidemia, hypothyroidism, lupus, cirrhosis, bipolar disorder, depression and anxiety, GERD who presented with complaints of worsening shortness of breath.  Chest x-ray done concerning for left lobe pneumonia.  Patient noted to be in hypoxic respiratory distress requiring 15 L high flow nasal cannula.  Patient placed on empiric IV antibiotics.  PCCM consulted.  Assessment & Plan:   Principal Problem:   Severe sepsis (HCC) Active Problems:   CAP (community acquired pneumonia)   Acute hypoxemic respiratory failure (HCC)   TOBACCO ABUSE   Acute asthma exacerbation   G E REFLUX   Essential hypertension   Hypothyroidism   Type 2 diabetes mellitus (HCC)   Gastroesophageal reflux disease   Chronic bronchitis (HCC)   Generalized anxiety disorder   Major depressive disorder, recurrent severe without psychotic features (HCC)  #1 severe sepsis secondary to community-acquired pneumonia -Patient on admission met criteria for severe sepsis with tachypnea, leukocytosis, chest x-ray with patchy left mid and lower lung opacities concerning for pneumonia. - Blood cultures pending. - MRSA PCR negative. - SARS coronavirus 2 PCR negative, influenza A and B by PCR negative, RSV by PCR negative. -ABG with a pH of 7.4, pCO2 of 46, pO2 of 65, bicarb of 28.5. -D-dimer negative. - Sputum Gram stain and cultures pending. - Patient states some clinical improvement. - Patient still requiring high O2 requirements of 10 L high flow. -Continue IV Rocephin , IV azithromycin . -Will place on Pulmicort  twice daily, Brovana  twice daily, Flonase , scheduled  DuoNebs, Claritin , PPI, Mucinex . - Due to increased high O2 requirements will consult with PCCM for further evaluation and management.  2.  Acute exacerbation of asthma/chronic bronchitis versus acute COPD exacerbation -Likely exacerbated by community-acquired pneumonia. - Patient noted to have wheezing and rhonchi on examination on presentation with some clinical improvement with increased O2 requirements. - Continue IV Solu-Medrol , scheduled DuoNebs, Pulmicort  twice daily. Place on Brovana  twice daily, Mucinex , Claritin , Flonase , PPI-  3.  Acute hypoxic respiratory failure -Likely secondary to problems #1 and 2. See #1 and 2. - If no significant improvement may need CT chest. Consult with PCCM for further evaluation and management.  4.  Mild hyponatremia --Felt likely secondary to poor oral intake in the setting of acute illness. -Improved with hydration.  5.  Tobacco abuse -Tobacco cessation. - Nicotine  patch.  6.  Poorly controlled diabetes mellitus type 2 -Initial glucose of 474 on presentation in the setting of steroid use. - Hemoglobin A1c noted at 10.9 (05/20/2023). - CBG noted at 424 this morning. - Repeat hemoglobin A1c pending. - Start Semglee  20 units daily. - SSI.  7.  Cirrhosis -Patient with no signs of ascites or hepatic encephalopathy at this time. - LFTs within normal limits. - Resume home regimen propranolol .  8.  Hypothyroidism -Synthroid .  9.  Hyperlipidemia -Resume home regimen statin.  10.  Bipolar disorder/anxiety/depression -Increase home regimen clonazepam  to twice daily as needed.  11.  Hypertension -Normotensive.  12.  GERD -PPI.  13.  SLE - Outpatient follow-up.   DVT prophylaxis: Lovenox  Code Status: Full Family Communication: Updated patient and mother at bedside. Disposition: Likely home when clinically improved.  Status is: Inpatient Remains inpatient  appropriate because: Severity of illness   Consultants:   PCCM  Procedures:  Chest x-ray 12/04/2023   Antimicrobials:  Anti-infectives (From admission, onward)    Start     Dose/Rate Route Frequency Ordered Stop   12/05/23 0100  cefTRIAXone  (ROCEPHIN ) 2 g in sodium chloride  0.9 % 100 mL IVPB        2 g 200 mL/hr over 30 Minutes Intravenous Every 24 hours 12/05/23 0040     12/05/23 0045  cefTRIAXone  (ROCEPHIN ) 1 g in sodium chloride  0.9 % 100 mL IVPB  Status:  Discontinued        1 g 200 mL/hr over 30 Minutes Intravenous Every 24 hours 12/05/23 0034 12/05/23 0039   12/05/23 0045  azithromycin  (ZITHROMAX ) 500 mg in sodium chloride  0.9 % 250 mL IVPB        500 mg 250 mL/hr over 60 Minutes Intravenous Every 24 hours 12/05/23 0034           Subjective: Patient states feeling much better and was hoping to be discharged home today.  Denies any chest pain.  Feels shortness of breath has improved.  On 10 L high flow nasal cannula.  Mother at bedside.  Objective: Vitals:   12/05/23 1100 12/05/23 1139 12/05/23 1200 12/05/23 1429  BP: 139/87  136/80   Pulse: 87 83 84   Resp: (!) 24  (!) 25   Temp:      TempSrc:      SpO2: 93%  93% 92%  Weight:      Height:        Intake/Output Summary (Last 24 hours) at 12/05/2023 1457 Last data filed at 12/05/2023 1200 Gross per 24 hour  Intake 1362.09 ml  Output 1200 ml  Net 162.09 ml   Filed Weights   12/05/23 0237  Weight: 82 kg    Examination:  General exam: Appears calm and comfortable.  Poor dentition. Respiratory system: Some coarse rhonchorous breath sounds.  Inspiratory wheezing.  No crackles.  Fair air movement.  No use of accessory muscles of respiration.  On high flow nasal cannula 10 L.  Cardiovascular system: S1 & S2 heard, RRR. No JVD, murmurs, rubs, gallops or clicks. No pedal edema. Gastrointestinal system: Abdomen is nondistended, soft and nontender. No organomegaly or masses felt. Normal bowel sounds heard. Central nervous system: Alert and oriented. No focal neurological  deficits. Extremities: Symmetric 5 x 5 power. Skin: No rashes, lesions or ulcers Psychiatry: Judgement and insight appear normal. Mood & affect appropriate.     Data Reviewed: I have personally reviewed following labs and imaging studies  CBC: Recent Labs  Lab 12/04/23 2257 12/05/23 0541  WBC 15.4* 11.5*  HGB 14.6 13.9  HCT 40.7 40.8  MCV 96.7 99.3  PLT 211 168    Basic Metabolic Panel: Recent Labs  Lab 12/04/23 2257 12/05/23 0541  NA 131* 133*  K 3.5 3.6  CL 95* 98  CO2 22 23  GLUCOSE 474* 388*  BUN 5* 7  CREATININE 0.65 0.61  CALCIUM  9.3 8.6*    GFR: Estimated Creatinine Clearance: 94 mL/min (by C-G formula based on SCr of 0.61 mg/dL).  Liver Function Tests: Recent Labs  Lab 12/05/23 0541  AST 22  ALT 18  ALKPHOS 113  BILITOT 1.0  PROT 7.9  ALBUMIN 3.3*    CBG: Recent Labs  Lab 12/05/23 0124 12/05/23 0246 12/05/23 0821 12/05/23 1241  GLUCAP 374* 355* 424* 316*     Recent Results (from the past 240 hours)  Resp  panel by RT-PCR (RSV, Flu A&B, Covid) Anterior Nasal Swab     Status: None   Collection Time: 12/04/23 10:57 PM   Specimen: Anterior Nasal Swab  Result Value Ref Range Status   SARS Coronavirus 2 by RT PCR NEGATIVE NEGATIVE Final    Comment: (NOTE) SARS-CoV-2 target nucleic acids are NOT DETECTED.  The SARS-CoV-2 RNA is generally detectable in upper respiratory specimens during the acute phase of infection. The lowest concentration of SARS-CoV-2 viral copies this assay can detect is 138 copies/mL. A negative result does not preclude SARS-Cov-2 infection and should not be used as the sole basis for treatment or other patient management decisions. A negative result may occur with  improper specimen collection/handling, submission of specimen other than nasopharyngeal swab, presence of viral mutation(s) within the areas targeted by this assay, and inadequate number of viral copies(<138 copies/mL). A negative result must be combined  with clinical observations, patient history, and epidemiological information. The expected result is Negative.  Fact Sheet for Patients:  BloggerCourse.com  Fact Sheet for Healthcare Providers:  SeriousBroker.it  This test is no t yet approved or cleared by the [place]  FDA and  has been authorized for detection and/or diagnosis of SARS-CoV-2 by FDA under an Emergency Use Authorization (EUA). This EUA will remain  in effect (meaning this test can be used) for the duration of the COVID-19 declaration under Section 564(b)(1) of the Act, 21 U.S.C.section 360bbb-3(b)(1), unless the authorization is terminated  or revoked sooner.       Influenza A by PCR NEGATIVE NEGATIVE Final   Influenza B by PCR NEGATIVE NEGATIVE Final    Comment: (NOTE) The Xpert Xpress SARS-CoV-2/FLU/RSV plus assay is intended as an aid in the diagnosis of influenza from Nasopharyngeal swab specimens and should not be used as a sole basis for treatment. Nasal washings and aspirates are unacceptable for Xpert Xpress SARS-CoV-2/FLU/RSV testing.  Fact Sheet for Patients: BloggerCourse.com  Fact Sheet for Healthcare Providers: SeriousBroker.it  This test is not yet approved or cleared by the [place]  FDA and has been authorized for detection and/or diagnosis of SARS-CoV-2 by FDA under an Emergency Use Authorization (EUA). This EUA will remain in effect (meaning this test can be used) for the duration of the COVID-19 declaration under Section 564(b)(1) of the Act, 21 U.S.C. section 360bbb-3(b)(1), unless the authorization is terminated or revoked.     Resp Syncytial Virus by PCR NEGATIVE NEGATIVE Final    Comment: (NOTE) Fact Sheet for Patients: BloggerCourse.com  Fact Sheet for Healthcare Providers: SeriousBroker.it  This test is not yet approved  or cleared by the [place]  FDA and has been authorized for detection and/or diagnosis of SARS-CoV-2 by FDA under an Emergency Use Authorization (EUA). This EUA will remain in effect (meaning this test can be used) for the duration of the COVID-19 declaration under Section 564(b)(1) of the Act, 21 U.S.C. section 360bbb-3(b)(1), unless the authorization is terminated or revoked.  Performed at [facility], [address] [place], [place] [address]   MRSA Next Gen by PCR, Nasal     Status: None   Collection Time: 12/05/23  2:25 AM   Specimen: Nasal Mucosa; Nasal Swab  Result Value Ref Range Status   MRSA by PCR Next Gen NOT DETECTED NOT DETECTED Final    Comment: (NOTE) The GeneXpert MRSA Assay (FDA approved for NASAL specimens only), is one component of a comprehensive MRSA colonization surveillance program. It is not intended to diagnose MRSA infection nor to guide or  monitor treatment for MRSA infections. Test performance is not FDA approved in patients less than 57 years old. Performed at [facility], [address] [place], [place] [address]   Expectorated Sputum Assessment w Gram Stain, Rflx to Resp Cult     Status: None   Collection Time: 12/05/23  1:29 PM   Specimen: Sputum  Result Value Ref Range Status   Specimen Description SPUTUM  Final   Special Requests NONE  Final   Sputum evaluation   Final    Sputum specimen not acceptable for testing.  Please recollect.   NOTIFIED H. SCHULTZ,RN ON 12/05/2023 AT 1347 BY SL Performed at [facility], [address] [place], [place] [address]    Report Status 12/05/2023 FINAL  Final         Radiology Studies: DG Chest Portable 1 View Result Date: 12/05/2023 CLINICAL DATA:  Chest pain EXAM: PORTABLE CHEST 1 VIEW COMPARISON:  11/03/2023 FINDINGS: Heart and mediastinal contours within normal limits. Right lung clear. Patchy left mid and lower lung opacities could  reflect early infiltrate. No effusions. No acute bony abnormality. IMPRESSION: Patchy left mid and lower lung opacities concerning for pneumonia. Electronically Signed   By: Franky Crease M.D.   On: 12/05/2023 00:14        Scheduled Meds:  amLODipine   5 mg Oral Daily   arformoterol   15 mcg Nebulization BID   budesonide  (PULMICORT ) nebulizer solution  0.5 mg Nebulization BID   Chlorhexidine  Gluconate Cloth  6 each Topical Daily   enoxaparin  (LOVENOX ) injection  40 mg Subcutaneous Q24H   fluticasone   2 spray Each Nare Daily   guaiFENesin   1,200 mg Oral BID   insulin  aspart  0-20 Units Subcutaneous TID WC   insulin  aspart  0-5 Units Subcutaneous QHS   insulin  glargine-yfgn  20 Units Subcutaneous Daily   ipratropium-albuterol   3 mL Nebulization Q6H   lamoTRIgine   150 mg Oral Daily   [START ON 12/06/2023] levothyroxine   75 mcg Oral Q0600   loratadine   10 mg Oral Daily   methylPREDNISolone  sodium succinate  60 mg Intravenous Q12H   nicotine   21 mg Transdermal Daily   mouth rinse  15 mL Mouth Rinse 4 times per day   pantoprazole   40 mg Oral Daily   PARoxetine   10 mg Oral QHS   propranolol   20 mg Oral TID   rosuvastatin   5 mg Oral Daily   Continuous Infusions:  sodium chloride  125 mL/hr at 12/05/23 1200   azithromycin  (ZITHROMAX ) 500 mg in sodium chloride  0.9 % 250 mL IVPB Stopped (12/05/23 0230)   cefTRIAXone  (ROCEPHIN )  IV Stopped (12/05/23 0122)     LOS: 0 days    Time spent: 45 minutes    Toribio Hummer, MD [facility]   To contact the attending provider between 7A-7P or the covering provider during after hours 7P-7A, please log into the web site [email or web address] and access using universal [place] password for that web site. If you do not have the password, please call the hospital operator.  12/05/2023, 2:57 PM

## 2023-12-05 NOTE — ED Notes (Signed)
 Report called to Darryle Law 2W and given to [facility]

## 2023-12-06 ENCOUNTER — Inpatient Hospital Stay (HOSPITAL_COMMUNITY)

## 2023-12-06 DIAGNOSIS — J45901 Unspecified asthma with (acute) exacerbation: Secondary | ICD-10-CM | POA: Diagnosis not present

## 2023-12-06 DIAGNOSIS — B348 Other viral infections of unspecified site: Secondary | ICD-10-CM

## 2023-12-06 DIAGNOSIS — Z794 Long term (current) use of insulin: Secondary | ICD-10-CM

## 2023-12-06 DIAGNOSIS — F411 Generalized anxiety disorder: Secondary | ICD-10-CM | POA: Diagnosis not present

## 2023-12-06 DIAGNOSIS — F17200 Nicotine dependence, unspecified, uncomplicated: Secondary | ICD-10-CM | POA: Diagnosis not present

## 2023-12-06 DIAGNOSIS — A419 Sepsis, unspecified organism: Secondary | ICD-10-CM | POA: Diagnosis not present

## 2023-12-06 DIAGNOSIS — K219 Gastro-esophageal reflux disease without esophagitis: Secondary | ICD-10-CM | POA: Diagnosis not present

## 2023-12-06 DIAGNOSIS — E119 Type 2 diabetes mellitus without complications: Secondary | ICD-10-CM | POA: Diagnosis not present

## 2023-12-06 DIAGNOSIS — I10 Essential (primary) hypertension: Secondary | ICD-10-CM | POA: Diagnosis not present

## 2023-12-06 DIAGNOSIS — E039 Hypothyroidism, unspecified: Secondary | ICD-10-CM | POA: Diagnosis not present

## 2023-12-06 DIAGNOSIS — J9601 Acute respiratory failure with hypoxia: Secondary | ICD-10-CM | POA: Diagnosis not present

## 2023-12-06 DIAGNOSIS — J411 Mucopurulent chronic bronchitis: Secondary | ICD-10-CM | POA: Diagnosis not present

## 2023-12-06 DIAGNOSIS — F332 Major depressive disorder, recurrent severe without psychotic features: Secondary | ICD-10-CM | POA: Diagnosis not present

## 2023-12-06 DIAGNOSIS — J189 Pneumonia, unspecified organism: Secondary | ICD-10-CM | POA: Diagnosis not present

## 2023-12-06 LAB — CBC WITH DIFFERENTIAL/PLATELET
Abs Immature Granulocytes: 0.14 K/uL — ABNORMAL HIGH (ref 0.00–0.07)
Basophils Absolute: 0 K/uL (ref 0.0–0.1)
Basophils Relative: 0 %
Eosinophils Absolute: 0 K/uL (ref 0.0–0.5)
Eosinophils Relative: 0 %
HCT: 41.1 % (ref 36.0–46.0)
Hemoglobin: 13.6 g/dL (ref 12.0–15.0)
Immature Granulocytes: 1 %
Lymphocytes Relative: 17 %
Lymphs Abs: 2.6 K/uL (ref 0.7–4.0)
MCH: 33.4 pg (ref 26.0–34.0)
MCHC: 33.1 g/dL (ref 30.0–36.0)
MCV: 101 fL — ABNORMAL HIGH (ref 80.0–100.0)
Monocytes Absolute: 0.4 K/uL (ref 0.1–1.0)
Monocytes Relative: 3 %
Neutro Abs: 11.6 K/uL — ABNORMAL HIGH (ref 1.7–7.7)
Neutrophils Relative %: 79 %
Platelets: 165 K/uL (ref 150–400)
RBC: 4.07 MIL/uL (ref 3.87–5.11)
RDW: 15.7 % — ABNORMAL HIGH (ref 11.5–15.5)
WBC: 14.8 K/uL — ABNORMAL HIGH (ref 4.0–10.5)
nRBC: 0.2 % (ref 0.0–0.2)

## 2023-12-06 LAB — RENAL FUNCTION PANEL
Albumin: 3 g/dL — ABNORMAL LOW (ref 3.5–5.0)
Anion gap: 9 (ref 5–15)
BUN: 9 mg/dL (ref 6–20)
CO2: 25 mmol/L (ref 22–32)
Calcium: 8.4 mg/dL — ABNORMAL LOW (ref 8.9–10.3)
Chloride: 99 mmol/L (ref 98–111)
Creatinine, Ser: 0.42 mg/dL — ABNORMAL LOW (ref 0.44–1.00)
GFR, Estimated: >60 mL/min (ref >60)
Glucose, Bld: 335 mg/dL — ABNORMAL HIGH (ref 70–99)
Phosphorus: 2.8 mg/dL (ref 2.5–4.6)
Potassium: 3.5 mmol/L (ref 3.5–5.1)
Sodium: 133 mmol/L — ABNORMAL LOW (ref 135–145)

## 2023-12-06 LAB — GLUCOSE, CAPILLARY
Glucose-Capillary: 364 mg/dL — ABNORMAL HIGH (ref 70–99)
Glucose-Capillary: 292 mg/dL — ABNORMAL HIGH (ref 70–99)
Glucose-Capillary: 224 mg/dL — ABNORMAL HIGH (ref 70–99)
Glucose-Capillary: 227 mg/dL — ABNORMAL HIGH (ref 70–99)

## 2023-12-06 LAB — MAGNESIUM: Magnesium: 2.1 mg/dL (ref 1.7–2.4)

## 2023-12-06 MED ORDER — CLONAZEPAM 0.5 MG PO TABS
0.5000 mg | ORAL_TABLET | Freq: Once | ORAL | Status: AC
  Administered 2023-12-06: 0.5 mg via ORAL
  Filled 2023-12-06: qty 1

## 2023-12-06 MED ORDER — PREDNISONE 20 MG PO TABS
40.0000 mg | ORAL_TABLET | Freq: Every day | ORAL | Status: DC
  Administered 2023-12-06 – 2023-12-07 (×2): 40 mg via ORAL
  Filled 2023-12-06 (×2): qty 2

## 2023-12-06 MED ORDER — FAMOTIDINE 20 MG PO TABS
20.0000 mg | ORAL_TABLET | Freq: Two times a day (BID) | ORAL | Status: DC
  Administered 2023-12-06 – 2023-12-13 (×15): 20 mg via ORAL
  Filled 2023-12-06 (×15): qty 1

## 2023-12-06 MED ORDER — INSULIN GLARGINE-YFGN 100 UNIT/ML ~~LOC~~ SOLN
30.0000 [IU] | Freq: Every day | SUBCUTANEOUS | Status: DC
  Administered 2023-12-06: 30 [IU] via SUBCUTANEOUS
  Filled 2023-12-06 (×2): qty 0.3

## 2023-12-06 MED ORDER — SALINE SPRAY 0.65 % NA SOLN
1.0000 | NASAL | Status: DC | PRN
  Administered 2023-12-06: 1 via NASAL
  Filled 2023-12-06 (×3): qty 44

## 2023-12-06 MED ORDER — PREDNISONE 5 MG PO TABS: 5.0000 mg | ORAL_TABLET | Freq: Every day | ORAL | Status: DC

## 2023-12-06 MED ORDER — INSULIN ASPART 100 UNIT/ML IJ SOLN
4.0000 [IU] | Freq: Three times a day (TID) | INTRAMUSCULAR | Status: DC
  Administered 2023-12-06 (×2): 4 [IU] via SUBCUTANEOUS

## 2023-12-06 MED ORDER — IPRATROPIUM-ALBUTEROL 0.5-2.5 (3) MG/3ML IN SOLN
3.0000 mL | RESPIRATORY_TRACT | Status: DC
  Administered 2023-12-06 – 2023-12-13 (×43): 3 mL via RESPIRATORY_TRACT
  Filled 2023-12-06 (×42): qty 3

## 2023-12-06 MED ORDER — ZIPRASIDONE HCL 40 MG PO CAPS
60.0000 mg | ORAL_CAPSULE | Freq: Every day | ORAL | Status: DC
  Administered 2023-12-06 – 2023-12-12 (×7): 60 mg via ORAL
  Filled 2023-12-06 (×7): qty 1

## 2023-12-06 MED ORDER — PREDNISONE 10 MG PO TABS: 10.0000 mg | ORAL_TABLET | Freq: Every day | ORAL | Status: DC

## 2023-12-06 MED ORDER — PREDNISONE 20 MG PO TABS: 30.0000 mg | ORAL_TABLET | Freq: Every day | ORAL | Status: DC

## 2023-12-06 MED ORDER — ORAL CARE MOUTH RINSE: 15.0000 mL | OROMUCOSAL | Status: DC | PRN

## 2023-12-06 MED ORDER — CARMEX CLASSIC LIP BALM EX OINT
1.0000 | TOPICAL_OINTMENT | CUTANEOUS | Status: DC | PRN
  Administered 2023-12-06: 1 via TOPICAL
  Filled 2023-12-06: qty 10

## 2023-12-06 MED ORDER — PREDNISONE 20 MG PO TABS: 20.0000 mg | ORAL_TABLET | Freq: Every day | ORAL | Status: DC

## 2023-12-06 NOTE — Progress Notes (Signed)
 PROGRESS NOTE    Emily [name]  FMW:[id number] DOB: 1981-01-30 DOA: 12/04/2023 PCP: [name], NP    Chief Complaint  Patient presents with   Chest Pain   Shortness of Breath    Brief Narrative:  Patient 43 year old female with history of asthma, chronic bronchitis and rhinitis, ongoing tobacco abuse, type 2 diabetes, hypertension, hyperlipidemia, hypothyroidism, lupus, cirrhosis, bipolar disorder, depression and anxiety, GERD who presented with complaints of worsening shortness of breath.  Chest x-ray done concerning for left lobe pneumonia.  Patient noted to be in hypoxic respiratory distress requiring 15 L high flow nasal cannula.  Patient placed on empiric IV antibiotics.  PCCM consulted.  Assessment & Plan:   Principal Problem:   Severe sepsis (HCC) Active Problems:   Acute hypoxemic respiratory failure (HCC)   Infection due to parainfluenza virus 3   TOBACCO ABUSE   Acute asthma exacerbation   G E REFLUX   Essential hypertension   Hypothyroidism   Type 2 diabetes mellitus (HCC)   Gastroesophageal reflux disease   Chronic bronchitis (HCC)   Generalized anxiety disorder   Major depressive disorder, recurrent severe without psychotic features (HCC)  #1 severe sepsis secondary to parainfluenza virus 3 pneumonia -Patient on admission met criteria for severe sepsis with tachypnea, leukocytosis, chest x-ray with patchy left mid and lower lung opacities concerning for pneumonia. - Blood cultures pending. - MRSA PCR negative. - SARS coronavirus 2 PCR negative, influenza A and B by PCR negative, RSV by PCR negative. -Respiratory viral panel positive for parainfluenza virus 3 -ABG with a pH of 7.4, pCO2 of 46, pO2 of 65, bicarb of 28.5. -D-dimer negative. - Sputum Gram stain and cultures pending. - Patient states some clinical improvement. - Patient still requiring high O2 requirements of 9 L high flow. - Procalcitonin noted to be negative.   - Patient seen by  PCCM and felt patient's symptoms secondary to a viral pneumonia and as such antibiotics have been discontinued.  - Continue Pulmicort  twice daily, Brovana  twice daily, Flonase , scheduled DuoNebs, Claritin , PPI, Mucinex . - Due to increased high O2 requirements patient seen in consultation by PCCM who are following and appreciate input and recommendations.   2.  Acute exacerbation of asthma/chronic bronchitis versus acute COPD exacerbation -Likely exacerbated by parainfluenza virus 3 infection/pneumonia. - Patient noted to have wheezing and rhonchi on examination on presentation with some clinical improvement with increased O2 requirements. - Improving slowly.   - Was on IV Solu-Medrol  and has been transition to a prednisone  taper per PCCM.   - Continue scheduled DuoNebs, Pulmicort , Brovana , Mucinex , Claritin , Flonase , PPI.   - PCCM following and appreciate input and recommendations.   3.  Acute hypoxic respiratory failure -Likely secondary to problems #1 and 2. See #1 and 2. -Respiratory viral panel positive for parainfluenza virus 3. - If no significant improvement may need CT chest. -Patient seen by PCCM who are following and appreciate their input and recommendations.  4.  Mild hyponatremia --Felt likely secondary to poor oral intake in the setting of acute illness. -Improved with hydration.  5.  Tobacco abuse -Tobacco cessation. - Nicotine  patch.  6.  Poorly controlled diabetes mellitus type 2 -Initial glucose of 474 on presentation in the setting of steroid use. - Hemoglobin A1c noted at 10.9 (05/20/2023). - CBG noted at 364 this morning. - Repeat hemoglobin A1c pending. -Increase Semglee  to 30 units daily. -Place on meal coverage NovoLog  4 units 3 times daily. -Continue SSI.  7.  Cirrhosis -Patient with  no signs of ascites or hepatic encephalopathy at this time. - LFTs within normal limits. - Continue home regimen propranolol .  8.  Hypothyroidism - Continue  Synthroid .  9.  Hyperlipidemia - Crestor .  10.  Bipolar disorder/anxiety/depression - Continue clonazepam  twice daily as needed.  11.  Hypertension - Stable.   12.  GERD - Continue PPI.  13.  SLE - Outpatient follow-up.   DVT prophylaxis: Lovenox  Code Status: Full Family Communication: Updated patient and mother at bedside. Disposition: Likely home when clinically improved and cleared by pulmonary/PCCM.  Status is: Inpatient Remains inpatient appropriate because: Severity of illness   Consultants:  PCCM: Dr Kara 12/05/2023  Procedures:  Chest x-ray 12/04/2023, 12/06/2023   Antimicrobials:  Anti-infectives (From admission, onward)    Start     Dose/Rate Route Frequency Ordered Stop   12/05/23 0100  cefTRIAXone  (ROCEPHIN ) 2 g in sodium chloride  0.9 % 100 mL IVPB  Status:  Discontinued        2 g 200 mL/hr over 30 Minutes Intravenous Every 24 hours 12/05/23 0040 12/05/23 1647   12/05/23 0045  cefTRIAXone  (ROCEPHIN ) 1 g in sodium chloride  0.9 % 100 mL IVPB  Status:  Discontinued        1 g 200 mL/hr over 30 Minutes Intravenous Every 24 hours 12/05/23 0034 12/05/23 0039   12/05/23 0045  azithromycin  (ZITHROMAX ) 500 mg in sodium chloride  0.9 % 250 mL IVPB  Status:  Discontinued        500 mg 250 mL/hr over 60 Minutes Intravenous Every 24 hours 12/05/23 0034 12/05/23 1647         Subjective: Patient laying in bed.  Overall states she is feeling better than on presentation.  States she just cannot sleep in the hospital and eager to go home.  Feels shortness of breath and cough is improved.  Denies any chest pain.  On 9 L high flow nasal cannula.  Mother at bedside.   Objective: Vitals:   12/06/23 0600 12/06/23 0700 12/06/23 0800 12/06/23 0900  BP: (!) 143/87 (!) 148/88 (!) 156/96 (!) 147/74  Pulse: 76 84 86 79  Resp: (!) 22 (!) 24 15 (!) 26  Temp:      TempSrc:      SpO2: 93% 93% 90% 92%  Weight:      Height:        Intake/Output Summary (Last 24 hours) at  12/06/2023 0941 Last data filed at 12/05/2023 1532 Gross per 24 hour  Intake 361.26 ml  Output 650 ml  Net -288.74 ml   Filed Weights   12/05/23 0237  Weight: 82 kg    Examination:  General exam: Appears calm and comfortable.  Poor dentition. Respiratory system: Decreased coarse rhonchorous breath sounds on the right.  Improving expiratory wheezing.  Fair air movement.  No use of accessory muscles of respiration.  Speaking in full sentences.  Currently on 9 L high flow nasal cannula.  Cardiovascular system: RRR no murmurs rubs or gallops.  No JVD.  No pitting lower extremity edema.  Gastrointestinal system: Abdomen is soft, nontender, nondistended, positive bowel sounds.  No rebound.  No guarding. Central nervous system: Alert and oriented. No focal neurological deficits. Extremities: Symmetric 5 x 5 power. Skin: No rashes, lesions or ulcers Psychiatry: Judgement and insight appear normal. Mood & affect appropriate.     Data Reviewed: I have personally reviewed following labs and imaging studies  CBC: Recent Labs  Lab 12/04/23 2257 12/05/23 0541 12/06/23 0252  WBC 15.4* 11.5* 14.8*  NEUTROABS  --   --  11.6*  HGB 14.6 13.9 13.6  HCT 40.7 40.8 41.1  MCV 96.7 99.3 101.0*  PLT 211 168 165    Basic Metabolic Panel: Recent Labs  Lab 12/04/23 2257 12/05/23 0541 12/06/23 0252  NA 131* 133* 133*  K 3.5 3.6 3.5  CL 95* 98 99  CO2 22 23 25   GLUCOSE 474* 388* 335*  BUN 5* 7 9  CREATININE 0.65 0.61 0.42*  CALCIUM  9.3 8.6* 8.4*  MG  --   --  2.1  PHOS  --   --  2.8    GFR: Estimated Creatinine Clearance: 94 mL/min (A) (by C-G formula based on SCr of 0.42 mg/dL (L)).  Liver Function Tests: Recent Labs  Lab 12/05/23 0541 12/06/23 0252  AST 22  --   ALT 18  --   ALKPHOS 113  --   BILITOT 1.0  --   PROT 7.9  --   ALBUMIN 3.3* 3.0*    CBG: Recent Labs  Lab 12/05/23 0821 12/05/23 1241 12/05/23 1625 12/05/23 2129 12/06/23 0756  GLUCAP 424* 316* 283* 228*  364*     Recent Results (from the past 240 hours)  Resp panel by RT-PCR (RSV, Flu A&B, Covid) Anterior Nasal Swab     Status: None   Collection Time: 12/04/23 10:57 PM   Specimen: Anterior Nasal Swab  Result Value Ref Range Status   SARS Coronavirus 2 by RT PCR NEGATIVE NEGATIVE Final    Comment: (NOTE) SARS-CoV-2 target nucleic acids are NOT DETECTED.  The SARS-CoV-2 RNA is generally detectable in upper respiratory specimens during the acute phase of infection. The lowest concentration of SARS-CoV-2 viral copies this assay can detect is 138 copies/mL. A negative result does not preclude SARS-Cov-2 infection and should not be used as the sole basis for treatment or other patient management decisions. A negative result may occur with  improper specimen collection/handling, submission of specimen other than nasopharyngeal swab, presence of viral mutation(s) within the areas targeted by this assay, and inadequate number of viral copies(<138 copies/mL). A negative result must be combined with clinical observations, patient history, and epidemiological information. The expected result is Negative.  Fact Sheet for Patients:  BloggerCourse.com  Fact Sheet for Healthcare Providers:  SeriousBroker.it  This test is no t yet approved or cleared by the [place]  FDA and  has been authorized for detection and/or diagnosis of SARS-CoV-2 by FDA under an Emergency Use Authorization (EUA). This EUA will remain  in effect (meaning this test can be used) for the duration of the COVID-19 declaration under Section 564(b)(1) of the Act, 21 U.S.C.section 360bbb-3(b)(1), unless the authorization is terminated  or revoked sooner.       Influenza A by PCR NEGATIVE NEGATIVE Final   Influenza B by PCR NEGATIVE NEGATIVE Final    Comment: (NOTE) The Xpert Xpress SARS-CoV-2/FLU/RSV plus assay is intended as an aid in the diagnosis of influenza from  Nasopharyngeal swab specimens and should not be used as a sole basis for treatment. Nasal washings and aspirates are unacceptable for Xpert Xpress SARS-CoV-2/FLU/RSV testing.  Fact Sheet for Patients: BloggerCourse.com  Fact Sheet for Healthcare Providers: SeriousBroker.it  This test is not yet approved or cleared by the [place]  FDA and has been authorized for detection and/or diagnosis of SARS-CoV-2 by FDA under an Emergency Use Authorization (EUA). This EUA will remain in effect (meaning this test can be used) for the duration of the COVID-19 declaration under Section 564(b)(1) of the  Act, 21 U.S.C. section 360bbb-3(b)(1), unless the authorization is terminated or revoked.     Resp Syncytial Virus by PCR NEGATIVE NEGATIVE Final    Comment: (NOTE) Fact Sheet for Patients: BloggerCourse.com  Fact Sheet for Healthcare Providers: SeriousBroker.it  This test is not yet approved or cleared by the [place]  FDA and has been authorized for detection and/or diagnosis of SARS-CoV-2 by FDA under an Emergency Use Authorization (EUA). This EUA will remain in effect (meaning this test can be used) for the duration of the COVID-19 declaration under Section 564(b)(1) of the Act, 21 U.S.C. section 360bbb-3(b)(1), unless the authorization is terminated or revoked.  Performed at [facility], [address], [place], [place] [address]   MRSA Next Gen by PCR, Nasal     Status: None   Collection Time: 12/05/23  2:25 AM   Specimen: Nasal Mucosa; Nasal Swab  Result Value Ref Range Status   MRSA by PCR Next Gen NOT DETECTED NOT DETECTED Final    Comment: (NOTE) The GeneXpert MRSA Assay (FDA approved for NASAL specimens only), is one component of a comprehensive MRSA colonization surveillance program. It is not intended to diagnose MRSA infection nor to guide or  monitor treatment for MRSA infections. Test performance is not FDA approved in patients less than 50 years old. Performed at [facility], [address] [place], [place] [address]   Culture, blood (Routine X 2) w Reflex to ID Panel     Status: None (Preliminary result)   Collection Time: 12/05/23  5:41 AM   Specimen: BLOOD LEFT ARM  Result Value Ref Range Status   Specimen Description   Final    BLOOD LEFT ARM Performed at [facility], [address], [place], [place] [address]    Special Requests   Final    BOTTLES DRAWN AEROBIC AND ANAEROBIC Blood Culture adequate volume Performed at [facility], [address] [place], [place] [address]    Culture   Final    NO GROWTH < 24 HOURS Performed at [facility], [address], [place], [place] [address]    Report Status PENDING  Incomplete  Culture, blood (Routine X 2) w Reflex to ID Panel     Status: None (Preliminary result)   Collection Time: 12/05/23  5:41 AM   Specimen: BLOOD LEFT ARM  Result Value Ref Range Status   Specimen Description   Final    BLOOD LEFT ARM Performed at [facility], [address] [place], [place] [address]    Special Requests   Final    BOTTLES DRAWN AEROBIC AND ANAEROBIC Blood Culture adequate volume Performed at [facility], [address] [place], [place] [address]    Culture   Final    NO GROWTH < 24 HOURS Performed at [facility], [address], [place], [place] [address]    Report Status PENDING  Incomplete  Expectorated Sputum Assessment w Gram Stain, Rflx to Resp Cult     Status: None   Collection Time: 12/05/23  1:29 PM   Specimen: Sputum  Result Value Ref Range Status   Specimen Description SPUTUM  Final   Special Requests NONE  Final   Sputum evaluation   Final    Sputum specimen not acceptable for testing.  Please recollect.   NOTIFIED H. SCHULTZ,RN ON 12/05/2023 AT 1347 BY SL Performed at  [facility], [address] [place], [place] [address]  Report Status 12/05/2023 FINAL  Final  Respiratory (~20 pathogens) panel by PCR     Status: Abnormal   Collection Time: 12/05/23  1:29 PM   Specimen: Nasopharyngeal Swab; Respiratory  Result Value Ref Range Status   Adenovirus NOT DETECTED NOT DETECTED Final   Coronavirus 229E NOT DETECTED NOT DETECTED Final    Comment: (NOTE) The Coronavirus on the Respiratory Panel, DOES NOT test for the novel  Coronavirus (2019 nCoV)    Coronavirus HKU1 NOT DETECTED NOT DETECTED Final   Coronavirus NL63 NOT DETECTED NOT DETECTED Final   Coronavirus OC43 NOT DETECTED NOT DETECTED Final   Metapneumovirus NOT DETECTED NOT DETECTED Final   Rhinovirus / Enterovirus NOT DETECTED NOT DETECTED Final   Influenza A NOT DETECTED NOT DETECTED Final   Influenza B NOT DETECTED NOT DETECTED Final   Parainfluenza Virus 1 NOT DETECTED NOT DETECTED Final   Parainfluenza Virus 2 NOT DETECTED NOT DETECTED Final   Parainfluenza Virus 3 DETECTED (A) NOT DETECTED Final   Parainfluenza Virus 4 NOT DETECTED NOT DETECTED Final   Respiratory Syncytial Virus NOT DETECTED NOT DETECTED Final   Bordetella pertussis NOT DETECTED NOT DETECTED Final   Bordetella Parapertussis NOT DETECTED NOT DETECTED Final   Chlamydophila pneumoniae NOT DETECTED NOT DETECTED Final   Mycoplasma pneumoniae NOT DETECTED NOT DETECTED Final    Comment: Performed at [facility], [address], [place], [place] [address]         Radiology Studies: DG Chest Portable 1 View Result Date: 12/05/2023 CLINICAL DATA:  Chest pain EXAM: PORTABLE CHEST 1 VIEW COMPARISON:  11/03/2023 FINDINGS: Heart and mediastinal contours within normal limits. Right lung clear. Patchy left mid and lower lung opacities could reflect early infiltrate. No effusions. No acute bony abnormality. IMPRESSION: Patchy left mid and lower lung opacities concerning for pneumonia. Electronically Signed    By: Franky Crease M.D.   On: 12/05/2023 00:14        Scheduled Meds:  amLODipine   5 mg Oral Daily   budesonide  (PULMICORT ) nebulizer solution  0.5 mg Nebulization BID   Chlorhexidine  Gluconate Cloth  6 each Topical Daily   enoxaparin  (LOVENOX ) injection  40 mg Subcutaneous Q24H   famotidine   20 mg Oral BID   fluticasone   2 spray Each Nare Daily   guaiFENesin   1,200 mg Oral BID   insulin  aspart  0-20 Units Subcutaneous TID WC   insulin  aspart  0-5 Units Subcutaneous QHS   insulin  aspart  4 Units Subcutaneous TID WC   insulin  glargine-yfgn  30 Units Subcutaneous Daily   ipratropium-albuterol   3 mL Nebulization Q4H   lamoTRIgine   150 mg Oral Daily   levothyroxine   75 mcg Oral Q0600   loratadine   10 mg Oral Daily   nicotine   21 mg Transdermal Daily   mouth rinse  15 mL Mouth Rinse 4 times per day   pantoprazole   40 mg Oral Daily   PARoxetine   10 mg Oral QHS   predniSONE   40 mg Oral Q breakfast   Followed by   NOREEN ON 12/09/2023] predniSONE   30 mg Oral Q breakfast   Followed by   NOREEN ON 12/12/2023] predniSONE   20 mg Oral Q breakfast   Followed by   NOREEN ON 12/15/2023] predniSONE   10 mg Oral Q breakfast   Followed by   NOREEN ON 12/18/2023] predniSONE   5 mg Oral Q breakfast   propranolol   20 mg Oral TID   rosuvastatin   5 mg Oral Daily   ziprasidone   60 mg Oral QHS   Continuous Infusions:     LOS: 1 day    Time spent: 45 minutes    Toribio Hummer, MD [facility]   To contact the attending provider between 7A-7P or the covering provider during after hours 7P-7A, please log into the web site [email or web address] and access using universal [place] password for that web site. If you do not have the password, please call the hospital operator.  12/06/2023, 9:41 AM

## 2023-12-06 NOTE — Progress Notes (Signed)
 NAME:  AMULYA QUINTIN, MRN:  [id number], DOB:  01-Mar-1981, LOS: 1 ADMISSION DATE:  12/04/2023, CONSULTATION DATE:  12/05/23 REFERRING MD:  Toribio Hummer, MD CHIEF COMPLAINT:  Pneumonia   History of Present Illness:  Yaritsa Savarino is a 43 year old woman, daily smoker with asthma, DMII, hypertension, hypothyroidism, bipolar disorder, cirrhosis and lupus who is admitted for acute hypoxemic respiratory failure due to pneumonia.   She reports 1 week of progressive dyspnea, cough and wheezing. She was treated with Zpak and prednisone  at an Urgent Care recently but her symptoms did not improve after being on therapy for 2 days.   She is followed by Dr. Geronimo in pulmonary clinic for asthma and lung nodules. Her asthma regimen includes advair diskus 250-50mcg 1 puff twice daily, montelukast  10mg  daily and as needed albuterol .   She is smoking 1 pack per day. She has history of positive ANA testing.  Pertinent  Medical History   Past Medical History:  Diagnosis Date   Abnormal CT of liver    Anxiety    Asthma    Asthma    Bipolar 1 disorder (HCC)    Bipolar disorder (HCC)    Chronic pain    Cirrhosis (HCC)    Depression    Diabetes mellitus without complication (HCC)    Fatty liver    Gallstones    GERD (gastroesophageal reflux disease)    Headache(784.0)    Hyperlipidemia    Hypertension    Hypothyroidism    Lupus (systemic lupus erythematosus) (HCC)    Pulmonary nodule    Tachycardia    Thyroid disease    Vitamin D  deficiency      Significant Hospital Events: Including procedures, antibiotic start and stop dates in addition to other pertinent events   8/16 admitted for respiratory failure and pneumonia, Viral panel positive for parainfluenza virus  Interim History / Subjective:   She is tearful this morning and wanting to go home Mother at bedside She has been weaned to 8L O2  Objective    Blood pressure (!) 148/88, pulse 84, temperature 98.4 F (36.9 C),  temperature source Oral, resp. rate (!) 24, height 5' 3.5 (1.613 m), weight 82 kg, last menstrual period 10/29/2023, SpO2 93%.        Intake/Output Summary (Last 24 hours) at 12/06/2023 0741 Last data filed at 12/05/2023 1532 Gross per 24 hour  Intake 843.76 ml  Output 650 ml  Net 193.76 ml   Filed Weights   12/05/23 0237  Weight: 82 kg    Examination: General: young woman, tearful, laying in bed HENT: [place]/AT, poor dentition, moist mucous membranes Lungs: scattered diffuse wheezing - improved Cardiovascular: rrr, no murmurs Abdomen: soft, non-tender, non-distended Extremities: warm, no edema Neuro: alert, oriented x 3, moving all extremities GU: n/a  CXR 8/17: bilateral patchy infiltrates Respiratory viral panel: parainfluenza virus  Resolved problem list   Assessment and Plan  Acute Hypoxemic Respiratory Failure Sepsis due to Viral Pneumonia, Parainfluenza Asthma with Acute Exacerbation Cigarette Smoker DMII Cirrhosis Hx of Lupus  Patient appears to have acute infectious pneumonia given history of sick contacts with exacerbation of her asthma with positive parainfluenza viral test. Other differential includes autoimmune related ILD given her history of lupus. Low concern for thromboembolic disease given negative ddimer.   Plan: - continue supplemental oxygen for goal SpO2 92% or higher - Prednisone  taper added today. Stop IV solumedrol. - Continue duonebs q4hours and as needed albuterol  - Antibiotics stopped due to positive viral panel  and negative procal - heavily encouraged smoking cessation - If she does not improve with above therapies, will obtain HRCT chest to evaluate for inflammatory lung disease related to lupus.  Best Practice (right click and Reselect all SmartList Selections daily)   Diet/type: Regular consistency (see orders) DVT prophylaxis LMWH Pressure ulcer(s): N/A GI prophylaxis: N/A Lines: N/A Foley:  N/A Code Status:  full code Last date  of multidisciplinary goals of care discussion [per primary team]  Labs   CBC: Recent Labs  Lab 12/04/23 2257 12/05/23 0541 12/06/23 0252  WBC 15.4* 11.5* 14.8*  NEUTROABS  --   --  11.6*  HGB 14.6 13.9 13.6  HCT 40.7 40.8 41.1  MCV 96.7 99.3 101.0*  PLT 211 168 165    Basic Metabolic Panel: Recent Labs  Lab 12/04/23 2257 12/05/23 0541 12/06/23 0252  NA 131* 133* 133*  K 3.5 3.6 3.5  CL 95* 98 99  CO2 22 23 25   GLUCOSE 474* 388* 335*  BUN 5* 7 9  CREATININE 0.65 0.61 0.42*  CALCIUM  9.3 8.6* 8.4*  MG  --   --  2.1  PHOS  --   --  2.8   GFR: Estimated Creatinine Clearance: 94 mL/min (A) (by C-G formula based on SCr of 0.42 mg/dL (L)). Recent Labs  Lab 12/04/23 2257 12/05/23 0541 12/05/23 1145 12/06/23 0252  PROCALCITON  --   --  <0.10  --   WBC 15.4* 11.5*  --  14.8*  LATICACIDVEN  --  1.6  --   --     Liver Function Tests: Recent Labs  Lab 12/05/23 0541 12/06/23 0252  AST 22  --   ALT 18  --   ALKPHOS 113  --   BILITOT 1.0  --   PROT 7.9  --   ALBUMIN 3.3* 3.0*   No results for input(s): LIPASE, AMYLASE in the last 168 hours. No results for input(s): AMMONIA in the last 168 hours.  ABG    Component Value Date/Time   PHART 7.4 12/05/2023 0818   PCO2ART 46 12/05/2023 0818   PO2ART 65 (L) 12/05/2023 0818   HCO3 28.5 (H) 12/05/2023 0818   O2SAT 94.6 12/05/2023 0818     Coagulation Profile: Recent Labs  Lab 12/05/23 0541  INR 1.1    Cardiac Enzymes: No results for input(s): CKTOTAL, CKMB, CKMBINDEX, TROPONINI in the last 168 hours.  HbA1C: Hgb A1c MFr Bld  Date/Time Value Ref Range Status  05/20/2023 01:06 PM 10.9 (H) 4.8 - 5.6 % Final    Comment:    (NOTE) Pre diabetes:          5.7%-6.4%  Diabetes:              >6.4%  Glycemic control for   <7.0% adults with diabetes   01/16/2023 10:41 AM 11.0 (H) 4.6 - 6.5 % Final    Comment:    Glycemic Control Guidelines for People with Diabetes:Non Diabetic:  <6%Goal of  Therapy: <7%Additional Action Suggested:  >8%     CBG: Recent Labs  Lab 12/05/23 0246 12/05/23 0821 12/05/23 1241 12/05/23 1625 12/05/23 2129  GLUCAP 355* 424* 316* 283* 228*     Critical care time: n/a    Dorn Chill, MD [facility] Office: [phone]   See Amion for personal pager PCCM on call pager [phone] until 7pm. Please call Elink 7p-7a. [phone]

## 2023-12-06 NOTE — Plan of Care (Signed)
  Problem: Education: Goal: Knowledge of General Education information will improve Description: Including pain rating scale, medication(s)/side effects and non-pharmacologic comfort measures Outcome: Progressing   Problem: Clinical Measurements: Goal: Respiratory complications will improve Outcome: Progressing Goal: Cardiovascular complication will be avoided Outcome: Progressing   Problem: Elimination: Goal: Will not experience complications related to bowel motility Outcome: Progressing   Problem: Coping: Goal: Level of anxiety will decrease Outcome: Not Progressing

## 2023-12-06 NOTE — Inpatient Diabetes Management (Addendum)
 Inpatient Diabetes Program Recommendations  AACE/ADA: New Consensus Statement on Inpatient Glycemic Control (2015)  Target Ranges:  Prepandial:   less than 140 mg/dL      Peak postprandial:   less than 180 mg/dL (1-2 hours)      Critically ill patients:  140 - 180 mg/dL   Lab Results  Component Value Date   GLUCAP 364 (H) 12/06/2023   HGBA1C 10.9 (H) 05/20/2023    Review of Glycemic Control  Diabetes history: DM1? Outpatient Diabetes medications: Humalog  15 units TID Current orders for Inpatient glycemic control: Semglee  30 units daily, Novolog  0-20 units correction scale TID, Novolog  0-5 units HS scale, Novolog  4 units TID  Inpatient Diabetes Program Recommendations:   Spoke with patient on the phone. Diabetes coordinator is remotely working over the weekend. Patient states that she was diagnosed with diabetes 12-15 years ago. Has been on Lantus  in the past, but was discontinued. Is now on only Humalog  15 units TID with meals. Patient states that she checks blood sugars X3 per day. Has home blood glucose meter kit at home. States that she saw her PCP about 2 weeks ago. Patient is on Medicaid. Noted that last HgbA1C in January, 2025 was 10.9%.  Discussed an insulin  regimen with using a basal insulin  such as Lantus . Recommend Lantus  and Humalog  for better control. Patient states that she would rather have a sliding scale for blood sugars and give insulin  prior to each meal according to her blood sugar. She states that she feels the Humalog  dosages she is taking is causing her to gain weight.   Will continue to follow blood sugars while in the hospital.  Marjorie Lunger RN BSN CDE Diabetes Coordinator Pager: [phone]  8am-5pm

## 2023-12-06 NOTE — TOC Initial Note (Signed)
 Transition of Care [facility]) - Initial/Assessment Note    Patient Details  Name: Emily [name] MRN: [id number] Date of Birth: 03-05-1981  Transition of Care [facility]) CM/SW Contact:    Sheri ONEIDA Sharps, LCSW Phone Number: 12/06/2023, 11:00 AM  Clinical Narrative:                 Pt from home alone. Pt continues medical workup. TOC following for dc needs.     Barriers to Discharge: Continued Medical Work up   Patient Goals and CMS Choice Patient states their goals for this hospitalization and ongoing recovery are:: return home   Choice offered to / list presented to : NA      Expected Discharge Plan and Services In-house Referral: NA Discharge Planning Services: NA   Living arrangements for the past 2 months: Single Family Home                 DME Arranged: N/A DME Agency: NA       HH Arranged: NA HH Agency: NA        Prior Living Arrangements/Services Living arrangements for the past 2 months: Single Family Home Lives with:: Self Patient language and need for interpreter reviewed:: Yes Do you feel safe going back to the place where you live?: Yes      Need for Family Participation in Patient Care: Yes (Comment) Care giver support system in place?: Yes (comment)   Criminal Activity/Legal Involvement Pertinent to Current Situation/Hospitalization: No - Comment as needed  Activities of Daily Living   ADL Screening (condition at time of admission) Independently performs ADLs?: Yes (appropriate for developmental age) Is the patient deaf or have difficulty hearing?: No Does the patient have difficulty seeing, even when wearing glasses/contacts?: No Does the patient have difficulty concentrating, remembering, or making decisions?: No  Permission Sought/Granted                  Emotional Assessment Appearance:: Appears stated age Attitude/Demeanor/Rapport: Engaged Affect (typically observed): Accepting Orientation: : Oriented to Self, Oriented to Place,  Oriented to  Time, Oriented to Situation Alcohol / Substance Use: Not Applicable Psych Involvement: No (comment)  Admission diagnosis:  Pneumonia [J18.9] Community acquired pneumonia of left lower lobe of lung [J18.9] Patient Active Problem List   Diagnosis Date Noted   Infection due to parainfluenza virus 3 12/06/2023   Severe sepsis (HCC) 12/05/2023   Acute hypoxemic respiratory failure (HCC) 12/05/2023   Mass of upper outer quadrant of right breast 11/19/2023   Burning with urination 11/19/2023   Pain with urination 11/19/2023   Effusion, left knee 08/26/2023   Positive ANA (antinuclear antibody) 08/13/2023   Menorrhagia with irregular cycle 07/08/2023   Hepatic cirrhosis (HCC) 07/08/2023   PTSD (post-traumatic stress disorder) 06/15/2023   Generalized anxiety disorder 06/15/2023   Panic disorder with agoraphobia 06/15/2023   Major depressive disorder, 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/2023   Bulimia nervosa 06/15/2023   Borderline personality disorder (HCC) 06/15/2023   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 06/15/2023   Chronic prescription benzodiazepine use 06/15/2023   History of alcohol use disorder in sustained remission 06/15/2023   Polypharmacy 06/15/2023   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   Caffeine-induced insomnia with snoring 06/15/2023   Irregular bleeding 06/02/2023   Pelvic cramping 06/02/2023   Urinary frequency 06/02/2023   Pregnancy examination or test, negative result 06/02/2023   Missed periods 06/02/2023   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   Sebaceous cyst of labia 02/03/2023   Cyst of left  ovary 08/11/2022   ASCUS with positive high risk HPV cervical 08/11/2022   Moody 07/30/2022   Hot flashes 07/30/2022   Friable cervix 07/30/2022   Spotting 07/30/2022   Amenorrhea, secondary 07/30/2022   Bartholin cyst 04/30/2022   Irregular periods 04/30/2022   Dental caries 03/11/2022   Tick bite of back 12/20/2020   Chronic  bronchitis (HCC) 07/09/2020   Breast tenderness 09/20/2018   Gastroesophageal reflux disease 09/16/2018   Shortness of breath 08/05/2018   Abnormal finding on lung imaging 08/05/2018   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   Swollen lymph nodes 01/06/2017   RUQ pain 10/08/2016   LLQ pain 10/08/2016   Menstrual period late 10/08/2016   Cyst of right Bartholin's gland 12/17/2015   Recurrent boils 06/18/2015   Type 2 diabetes mellitus (HCC) 04/18/2015   Ingrowing toenail 04/18/2015   Vitamin D  deficiency 04/18/2015   Angular cheilitis 04/18/2015   Dyslipidemia 04/18/2015   Diabetes mellitus with complication in adult patient (HCC) 02/20/2015   Smoker 02/20/2015   Essential hypertension 02/20/2015   Pain in limb 02/20/2015   Myalgia 02/20/2015   Hypothyroidism 02/20/2015   Leg pain, inferior 02/20/2015   Toenail deformity 02/20/2015   Elevated liver enzymes 01/07/2012   Abnormal CT of liver 09/30/2011   Fatty liver 09/30/2011   G E REFLUX 11/30/2007   CHEST PAIN-UNSPECIFIED 11/30/2007   HEADACHE 10/21/2007   DYSPNEA 09/15/2007   UNSPECIFIED TACHYCARDIA 06/01/2007   COUGH 05/24/2007   TOBACCO ABUSE 05/20/2007   Pulmonary nodules 05/12/2007   DEPRESSION 04/19/2007   Acute asthma exacerbation 04/19/2007   PCP:  [name], NP Pharmacy:   [facility] [phone] - [place], [place] - [address] [place] [address] Phone: [phone] Fax: [phone]     Social Drivers of Health (SDOH) Social History: SDOH Screenings   Food Insecurity: No Food Insecurity (12/05/2023)  Housing: Low Risk  (12/05/2023)  Transportation Needs: No Transportation Needs (12/05/2023)  Utilities: Not At Risk (12/05/2023)  Alcohol Screen: Low Risk  (09/24/2022)  Depression (PHQ2-9): High Risk (06/15/2023)  Financial Resource Strain: Low Risk  (07/13/2023)   Received from [facility]  Physical Activity: Unknown (07/13/2023)   Received from [facility]  Recent  Concern: Physical Activity - Insufficiently Active (05/28/2023)  Social Connections: Moderately Integrated (12/05/2023)  Stress: No Stress Concern Present (07/13/2023)   Received from [facility]  Tobacco Use: High Risk (12/04/2023)   SDOH Interventions:     Readmission Risk Interventions    12/06/2023   10:58 AM  Readmission Risk Prevention Plan  Transportation Screening Complete  Medication Review (RN Care Manager) Complete  PCP or Specialist appointment within 3-5 days of discharge Complete  HRI or Home Care Consult Complete  SW Recovery Care/Counseling Consult Complete  Palliative Care Screening Not Applicable  Skilled Nursing Facility Not Applicable

## 2023-12-07 ENCOUNTER — Inpatient Hospital Stay (HOSPITAL_COMMUNITY)

## 2023-12-07 DIAGNOSIS — I10 Essential (primary) hypertension: Secondary | ICD-10-CM | POA: Diagnosis not present

## 2023-12-07 DIAGNOSIS — J189 Pneumonia, unspecified organism: Secondary | ICD-10-CM | POA: Diagnosis not present

## 2023-12-07 DIAGNOSIS — F411 Generalized anxiety disorder: Secondary | ICD-10-CM | POA: Diagnosis not present

## 2023-12-07 DIAGNOSIS — R590 Localized enlarged lymph nodes: Secondary | ICD-10-CM | POA: Diagnosis not present

## 2023-12-07 DIAGNOSIS — F332 Major depressive disorder, recurrent severe without psychotic features: Secondary | ICD-10-CM | POA: Diagnosis not present

## 2023-12-07 DIAGNOSIS — F17210 Nicotine dependence, cigarettes, uncomplicated: Secondary | ICD-10-CM | POA: Diagnosis not present

## 2023-12-07 DIAGNOSIS — R918 Other nonspecific abnormal finding of lung field: Secondary | ICD-10-CM | POA: Diagnosis not present

## 2023-12-07 DIAGNOSIS — J45901 Unspecified asthma with (acute) exacerbation: Secondary | ICD-10-CM | POA: Diagnosis not present

## 2023-12-07 DIAGNOSIS — F17200 Nicotine dependence, unspecified, uncomplicated: Secondary | ICD-10-CM | POA: Diagnosis not present

## 2023-12-07 DIAGNOSIS — E039 Hypothyroidism, unspecified: Secondary | ICD-10-CM | POA: Diagnosis not present

## 2023-12-07 DIAGNOSIS — K745 Biliary cirrhosis, unspecified: Secondary | ICD-10-CM

## 2023-12-07 DIAGNOSIS — B348 Other viral infections of unspecified site: Secondary | ICD-10-CM | POA: Diagnosis not present

## 2023-12-07 DIAGNOSIS — J9600 Acute respiratory failure, unspecified whether with hypoxia or hypercapnia: Secondary | ICD-10-CM | POA: Diagnosis not present

## 2023-12-07 DIAGNOSIS — J411 Mucopurulent chronic bronchitis: Secondary | ICD-10-CM | POA: Diagnosis not present

## 2023-12-07 DIAGNOSIS — J9601 Acute respiratory failure with hypoxia: Secondary | ICD-10-CM | POA: Diagnosis not present

## 2023-12-07 DIAGNOSIS — K219 Gastro-esophageal reflux disease without esophagitis: Secondary | ICD-10-CM | POA: Diagnosis not present

## 2023-12-07 DIAGNOSIS — E119 Type 2 diabetes mellitus without complications: Secondary | ICD-10-CM | POA: Diagnosis not present

## 2023-12-07 DIAGNOSIS — A419 Sepsis, unspecified organism: Secondary | ICD-10-CM | POA: Diagnosis not present

## 2023-12-07 LAB — CBC WITH DIFFERENTIAL/PLATELET
Abs Immature Granulocytes: 0.23 K/uL — ABNORMAL HIGH (ref 0.00–0.07)
Basophils Absolute: 0 K/uL (ref 0.0–0.1)
Basophils Relative: 0 %
Eosinophils Absolute: 0 K/uL (ref 0.0–0.5)
Eosinophils Relative: 0 %
HCT: 40.8 % (ref 36.0–46.0)
Hemoglobin: 13.9 g/dL (ref 12.0–15.0)
Immature Granulocytes: 1 %
Lymphocytes Relative: 34 %
Lymphs Abs: 5.6 K/uL — ABNORMAL HIGH (ref 0.7–4.0)
MCH: 34.5 pg — ABNORMAL HIGH (ref 26.0–34.0)
MCHC: 34.1 g/dL (ref 30.0–36.0)
MCV: 101.2 fL — ABNORMAL HIGH (ref 80.0–100.0)
Monocytes Absolute: 0.6 K/uL (ref 0.1–1.0)
Monocytes Relative: 4 %
Neutro Abs: 10 K/uL — ABNORMAL HIGH (ref 1.7–7.7)
Neutrophils Relative %: 61 %
Platelets: 179 K/uL (ref 150–400)
RBC: 4.03 MIL/uL (ref 3.87–5.11)
RDW: 15.7 % — ABNORMAL HIGH (ref 11.5–15.5)
WBC: 16.5 K/uL — ABNORMAL HIGH (ref 4.0–10.5)
nRBC: 0.2 % (ref 0.0–0.2)

## 2023-12-07 LAB — GLUCOSE, CAPILLARY
Glucose-Capillary: 176 mg/dL — ABNORMAL HIGH (ref 70–99)
Glucose-Capillary: 326 mg/dL — ABNORMAL HIGH (ref 70–99)
Glucose-Capillary: 334 mg/dL — ABNORMAL HIGH (ref 70–99)
Glucose-Capillary: 324 mg/dL — ABNORMAL HIGH (ref 70–99)

## 2023-12-07 LAB — RENAL FUNCTION PANEL
Albumin: 2.8 g/dL — ABNORMAL LOW (ref 3.5–5.0)
Anion gap: 11 (ref 5–15)
BUN: 10 mg/dL (ref 6–20)
CO2: 26 mmol/L (ref 22–32)
Calcium: 8.6 mg/dL — ABNORMAL LOW (ref 8.9–10.3)
Chloride: 101 mmol/L (ref 98–111)
Creatinine, Ser: 0.43 mg/dL — ABNORMAL LOW (ref 0.44–1.00)
GFR, Estimated: >60 mL/min (ref >60)
Glucose, Bld: 168 mg/dL — ABNORMAL HIGH (ref 70–99)
Phosphorus: 3.2 mg/dL (ref 2.5–4.6)
Potassium: 3.3 mmol/L — ABNORMAL LOW (ref 3.5–5.1)
Sodium: 138 mmol/L (ref 135–145)

## 2023-12-07 LAB — LEGIONELLA PNEUMOPHILA SEROGP 1 UR AG: L. pneumophila Serogp 1 Ur Ag: NEGATIVE

## 2023-12-07 LAB — HEMOGLOBIN A1C
Hgb A1c MFr Bld: 9.6 % — ABNORMAL HIGH (ref 4.8–5.6)
Mean Plasma Glucose: 228.82 mg/dL

## 2023-12-07 LAB — MAGNESIUM: Magnesium: 2.2 mg/dL (ref 1.7–2.4)

## 2023-12-07 MED ORDER — METHYLPREDNISOLONE SODIUM SUCC 125 MG IJ SOLR
125.0000 mg | Freq: Four times a day (QID) | INTRAMUSCULAR | Status: DC
  Administered 2023-12-07 – 2023-12-09 (×9): 125 mg via INTRAVENOUS
  Filled 2023-12-07 (×9): qty 2

## 2023-12-07 MED ORDER — POTASSIUM CHLORIDE CRYS ER 20 MEQ PO TBCR
40.0000 meq | EXTENDED_RELEASE_TABLET | Freq: Once | ORAL | Status: AC
  Administered 2023-12-07: 40 meq via ORAL
  Filled 2023-12-07: qty 2

## 2023-12-07 MED ORDER — POTASSIUM CHLORIDE CRYS ER 20 MEQ PO TBCR
20.0000 meq | EXTENDED_RELEASE_TABLET | Freq: Once | ORAL | Status: AC
  Administered 2023-12-07: 20 meq via ORAL
  Filled 2023-12-07: qty 1

## 2023-12-07 MED ORDER — INSULIN GLARGINE-YFGN 100 UNIT/ML ~~LOC~~ SOLN
34.0000 [IU] | Freq: Every day | SUBCUTANEOUS | Status: DC
  Administered 2023-12-07: 34 [IU] via SUBCUTANEOUS
  Filled 2023-12-07 (×2): qty 0.34

## 2023-12-07 MED ORDER — INSULIN ASPART 100 UNIT/ML IJ SOLN
6.0000 [IU] | Freq: Three times a day (TID) | INTRAMUSCULAR | Status: DC
  Administered 2023-12-07: 6 [IU] via SUBCUTANEOUS

## 2023-12-07 MED ORDER — GUAIFENESIN-DM 100-10 MG/5ML PO SYRP
5.0000 mL | ORAL_SOLUTION | ORAL | Status: DC | PRN
  Administered 2023-12-07 – 2023-12-13 (×3): 5 mL via ORAL
  Filled 2023-12-07 (×4): qty 10

## 2023-12-07 NOTE — Progress Notes (Signed)
 NAME:  Emily [name], MRN:  [id number], DOB:  January 16, 1981, LOS: 2 ADMISSION DATE:  12/04/2023, CONSULTATION DATE:  12/05/23 REFERRING MD:  Toribio Hummer, MD CHIEF COMPLAINT:  Pneumonia   History of Present Illness:  Emily [name] is a 43 year old woman, daily smoker with asthma, DMII, hypertension, hypothyroidism, bipolar disorder, cirrhosis and lupus who is admitted for acute hypoxemic respiratory failure due to pneumonia.   She reports 1 week of progressive dyspnea, cough and wheezing. She was treated with Zpak and prednisone  at an Urgent Care recently but her symptoms did not improve after being on therapy for 2 days.   She is followed by Dr. Geronimo in pulmonary clinic for asthma and lung nodules. Her asthma regimen includes advair diskus 250-50mcg 1 puff twice daily, montelukast  10mg  daily and as needed albuterol .   She is smoking 1 pack per day. She has history of positive ANA testing.  Pertinent  Medical History    has a past medical history of Abnormal CT of liver, Anxiety, Asthma, Asthma, Bipolar 1 disorder (HCC), Bipolar disorder (HCC), Chronic pain, Cirrhosis (HCC), Depression, Diabetes mellitus without complication (HCC), Fatty liver, Gallstones, GERD (gastroesophageal reflux disease), Headache(784.0), Hyperlipidemia, Hypertension, Hypothyroidism, Lupus (systemic lupus erythematosus) (HCC), Pulmonary nodule, Tachycardia, Thyroid disease, and Vitamin D  deficiency.   has a past surgical history that includes Dilation and curettage of uterus and Upper gi endoscopy (08/28/2022).    Significant Hospital Events: Including procedures, antibiotic start and stop dates in addition to other pertinent events   8/16 admitted for respiratory failure and pneumonia, Viral panel positive for parainfluenza virus 8/18  - She is tearful this morning and wanting to go home. Mother at bedside. She has been weaned to 8L O2  Interim History / Subjective:    8/1: HFNC 12L . Afebrile. Pulse  ox 88-91%. PAtient and mom at bedside. She is very fearful of ending up oin ventilator and dying. She is in tears. Refused BiPAP but after counseling agreed to try on. Also feels beathin worse afte rIV steroids changed to po per patient and mom.   Mom reports multiple family members sick  Objective    Blood pressure 134/73, pulse 80, temperature 98.5 F (36.9 C), temperature source Oral, resp. rate (!) 31, height 5' 3.5 (1.613 m), weight 82 kg, last menstrual period 10/29/2023, SpO2 93%.       No intake or output data in the 24 hours ending 12/07/23 1025  Filed Weights   12/05/23 0237  Weight: 82 kg    Examination:  General Appearance:  Obese in bed. Tolerated beiug 15 degrees flat and talking phone Head:  Normocephalic, without obvious abnormality, atraumatic Eyes:  PERRL - yes, conjunctiva/corneas - mudd     Ears:  Normal external ear canals, both ears Nose:  G tube - 12-13L [place] p ousle ox 88-91% Throat:  ETT TUBE - no , OG tube - no Neck:  Supple,  No enlargement/tenderness/nodules Lungs: RR 24 -32 . Not paradoxical. Full sentensces +. But anxious and gets more tachypneic Heart:  S1 and S2 normal, no murmur, CVP - no.  Pressors - no Abdomen:  Soft, no masses, no organomegaly Genitalia / Rectal:  Not done Extremities:  Extremities- intact Skin:  ntact in exposed areas . Sacral area - not examined Neurologic:  Sedation - noine -> RASS - +! SABRA Moves all 4s - yes. CAM-ICU - neg . Orientation - x3+     Resolved problem list   Assessment and Plan  Acute Hypoxemic  Respiratory Failure Sepsis due to Viral Pneumonia, Parainfluenza Asthma with Acute Exacerbation Cigarette Smoker DMII Cirrhosis Hx of Lupus  Patient appears to have acute infectious pneumonia given history of sick contacts with exacerbation of her asthma with positive parainfluenza viral test. Other differential includes autoimmune related ILD given her history of lupus. Low concern for thromboembolic disease  given negative ddimer.   8/18 - behaving like Acute Lung Injury    Plan: - START BiPAP at bedtime + 2 applicatioins in day time   - she was counseled - continue supplemental oxygen for goal SpO2 92% or higher - consider moving to HFNC if goes to 1%: [place] - change po pored to IV solumedrol (her subjective request) - - Continue duonebs q4hours and as needed albuterol  - monitor off antibiotics - check ESR, Procal, BNP 12/08/23 - GEt CT chest wo contrast - heavily encouraged smoking cessation -intubatge if worsens  Best Practice (right click and Reselect all SmartList Selections daily)   Diet/type: Regular consistency (see orders) DVT prophylaxis LMWH Pressure ulcer(s): N/A GI prophylaxis: N/A Lines: N/A Foley:  N/A Code Status:  full code Last date of multidisciplinary goals of care discussion [per primary team]   Mom and patient at bedside8/18     ATTESTATION & SIGNATURE   The patient Emily [name] is critically ill with multiple organ systems failure and requires high complexity decision making for assessment and support, frequent evaluation and titration of therapies, application of advanced monitoring technologies and extensive interpretation of multiple databases and discussion with other appropriate health care personnel such as bedside nurses, social workers, case Production designer, theatre/television/film, consultants, respiratory therapists, nutritionists, secretaries etc.,  Critical care time includes but is not restricted to just documentation time. Documentation can happen in parallel or sequential to care time depending on case mix urgency and priorities for the shift. So, overall critical Care Time devoted to patient care services described in this note is  30  Minutes.   This time reflects time of care of this signee Dr Dorethia Cave which includ does not reflect procedure time, or teaching time or supervisory time of PA/NP/Med student/Med Resident etc but could involve care discussion time      Dr. Dorethia Cave, M.D., [facility].C.P Pulmonary and Critical Care Medicine Staff Physician, [facility] Pager: [phone], If no answer or between  15:00h - 7:00h: call [phone]  12/07/2023 10:52 AM     LABS    PULMONARY Recent Labs  Lab 12/05/23 0818  PHART 7.4  PCO2ART 46  PO2ART 65*  HCO3 28.5*  O2SAT 94.6    CBC Recent Labs  Lab 12/05/23 0541 12/06/23 0252 12/07/23 0254  HGB 13.9 13.6 13.9  HCT 40.8 41.1 40.8  WBC 11.5* 14.8* 16.5*  PLT 168 165 179    COAGULATION Recent Labs  Lab 12/05/23 0541  INR 1.1    CARDIAC  No results for input(s): TROPONINI in the last 168 hours. No results for input(s): PROBNP in the last 168 hours.   CHEMISTRY Recent Labs  Lab 12/04/23 2257 12/05/23 0541 12/06/23 0252 12/07/23 0254  NA 131* 133* 133* 138  K 3.5 3.6 3.5 3.3*  CL 95* 98 99 101  CO2 22 23 25 26   GLUCOSE 474* 388* 335* 168*  BUN 5* 7 9 10   CREATININE 0.65 0.61 0.42* 0.43*  CALCIUM  9.3 8.6* 8.4* 8.6*  MG  --   --  2.1 2.2  PHOS  --   --  2.8 3.2   Estimated Creatinine Clearance: 94 mL/min (A) (by C-G formula based on SCr of 0.43 mg/dL (L)).   LIVER Recent Labs  Lab 12/05/23 0541 12/06/23 0252 12/07/23 0254  AST 22  --   --   ALT 18  --   --   ALKPHOS 113  --   --   BILITOT 1.0  --   --   PROT 7.9  --   --   ALBUMIN 3.3* 3.0* 2.8*  INR 1.1  --   --      INFECTIOUS Recent Labs  Lab 12/05/23 0541 12/05/23 1145  LATICACIDVEN 1.6  --   PROCALCITON  --  <0.10     ENDOCRINE CBG (last 3)  Recent Labs    12/06/23 1618 12/06/23 2140 12/07/23 0752  GLUCAP 224* 227* 176*         IMAGING x48h  - image(s) personally visualized  -   highlighted in bold DG CHEST PORT 1 VIEW Result Date: 12/06/2023 EXAM: 1 VIEW XRAY OF THE CHEST 12/06/2023 08:09:03 AM COMPARISON: None available. CLINICAL HISTORY: Hypoxia, patient with parainfluenza virus; Pt is a smoker, hx of Asthma.  FINDINGS: LUNGS AND PLEURA: Patchy interstitial airspace opacities are superimposed on chronic changes of COPD. Findings are consistent with a viral pneumonitis. No pleural effusion. No pneumothorax. HEART AND MEDIASTINUM: No acute abnormality of the cardiac and mediastinal silhouettes. BONES AND SOFT TISSUES: No acute osseous abnormality. IMPRESSION: 1. Findings consistent with viral pneumonitis, superimposed on chronic changes of COPD. Electronically signed by: Lonni Necessary MD 12/06/2023 11:35 AM EDT RP Workstation: [id number]

## 2023-12-07 NOTE — Plan of Care (Signed)
  Problem: Education: Goal: Knowledge of General Education information will improve Description: Including pain rating scale, medication(s)/side effects and non-pharmacologic comfort measures Outcome: Progressing   Problem: Pain Managment: Goal: General experience of comfort will improve and/or be controlled Outcome: Progressing   Problem: Activity: Goal: Ability to tolerate increased activity will improve Outcome: Progressing   Problem: Clinical Measurements: Goal: Respiratory complications will improve Outcome: Not Progressing   Problem: Coping: Goal: Level of anxiety will decrease Outcome: Not Progressing

## 2023-12-07 NOTE — Progress Notes (Addendum)
 PROGRESS NOTE    Emily [name]  FMW:[id number] DOB: 1981-02-19 DOA: 12/04/2023 PCP: [name], NP    Chief Complaint  Patient presents with   Chest Pain   Shortness of Breath    Brief Narrative:  Patient 43 year old female with history of asthma, chronic bronchitis and rhinitis, ongoing tobacco abuse, type 2 diabetes, hypertension, hyperlipidemia, hypothyroidism, lupus, cirrhosis, bipolar disorder, depression and anxiety, GERD who presented with complaints of worsening shortness of breath.  Chest x-ray done concerning for left lobe pneumonia.  Patient noted to be in hypoxic respiratory distress requiring 15 L high flow nasal cannula.  Patient placed on empiric IV antibiotics.  PCCM consulted.  Assessment & Plan:   Principal Problem:   Severe sepsis (HCC) Active Problems:   Acute hypoxemic respiratory failure (HCC)   Infection due to parainfluenza virus 3   TOBACCO ABUSE   Acute asthma exacerbation   G E REFLUX   Essential hypertension   Hypothyroidism   Type 2 diabetes mellitus (HCC)   Gastroesophageal reflux disease   Chronic bronchitis (HCC)   Generalized anxiety disorder   Major depressive disorder, recurrent severe without psychotic features (HCC)  #1 severe sepsis secondary to parainfluenza virus 3 pneumonia -Patient on admission met criteria for severe sepsis with tachypnea, leukocytosis, chest x-ray with patchy left mid and lower lung opacities concerning for pneumonia. - Blood cultures pending. - MRSA PCR negative. - SARS coronavirus 2 PCR negative, influenza A and B by PCR negative, RSV by PCR negative. -Respiratory viral panel positive for parainfluenza virus 3 -ABG with a pH of 7.4, pCO2 of 46, pO2 of 65, bicarb of 28.5. -D-dimer negative. - Sputum Gram stain and cultures pending. - Patient states some clinical improvement. - Patient still requiring high O2 requirements of 13 L high flow. - Procalcitonin noted to be negative.   - Patient seen by  PCCM and felt patient's symptoms secondary to a viral pneumonia and as such antibiotics have been discontinued.  - Continue Pulmicort  twice daily, Brovana  twice daily, Flonase , scheduled DuoNebs, Claritin , PPI, Mucinex , prednisone  taper. -Patient with worsening hypoxia may need to consider CT chest however with this deferred to PCCM.  May need to be placed back on IV Solu-Medrol . - Due to increased high O2 requirements patient seen in consultation by PCCM who are following and appreciate input and recommendations.   2.  Acute exacerbation of asthma/chronic bronchitis versus acute COPD exacerbation -Likely exacerbated by parainfluenza virus 3 infection/pneumonia. - Patient noted to have wheezing and rhonchi on examination on presentation with some clinical improvement with increased O2 requirements. - Was slowly clinically improving however patient now with increased O2 requirements currently on 13 L high flow nasal cannula. - Was on IV Solu-Medrol  and has been transitioned to a prednisone  taper per PCCM.   -Due to increasing O2 requirements may need to consider placing back on Solu-Medrol  however will defer to PCCM. - Continue scheduled DuoNebs, Pulmicort , Brovana , Mucinex , Claritin , Flonase , PPI.   - PCCM following and appreciate input and recommendations.   3.  Acute hypoxic respiratory failure -Likely secondary to problems #1 and 2. See #1 and 2. -Patient with increasing O2 requirements currently on 13 L high flow nasal cannula. -Respiratory viral panel positive for parainfluenza virus 3. - May need to consider CT chest due to worsening hypoxia however will defer to PCCM/pulmonary.  -Patient seen by PCCM who are following and appreciate their input and recommendations.  4.  Mild hyponatremia --Felt likely secondary to poor oral intake in  the setting of acute illness. -Improved with hydration.  5.  Tobacco abuse -Tobacco cessation. - Nicotine  patch.  6.  Poorly controlled diabetes  mellitus type 2 -Initial glucose of 474 on presentation in the setting of steroid use. - Hemoglobin A1c noted at 10.9 (05/20/2023). - CBG noted at 176 this morning. - Repeat hemoglobin A1c pending. -Increase Semglee  to 34 units daily. - Increase NovoLog  meal coverage to 6 units 3 times daily with meals.  -SSI.   7.  Cirrhosis -Patient with no signs of ascites or hepatic encephalopathy at this time. - LFTs within normal limits. - Continue propranolol .   8.  Hypothyroidism - Synthroid .  9.  Hyperlipidemia - Crestor .  10.  Bipolar disorder/anxiety/depression - Continue clonazepam  twice daily as needed.   11.  Hypertension - Stable.   12.  GERD - PPI.    13.  SLE - Outpatient follow-up.  14.  Hypokalemia -Replete.   DVT prophylaxis: Lovenox  Code Status: Full Family Communication: Updated patient and mother at bedside. Disposition: Likely home when clinically improved and cleared by pulmonary/PCCM.  Status is: Inpatient Remains inpatient appropriate because: Severity of illness   Consultants:  PCCM: Dr Kara 12/05/2023  Procedures:  Chest x-ray 12/04/2023, 12/06/2023   Antimicrobials:  Anti-infectives (From admission, onward)    Start     Dose/Rate Route Frequency Ordered Stop   12/05/23 0100  cefTRIAXone  (ROCEPHIN ) 2 g in sodium chloride  0.9 % 100 mL IVPB  Status:  Discontinued        2 g 200 mL/hr over 30 Minutes Intravenous Every 24 hours 12/05/23 0040 12/05/23 1647   12/05/23 0045  cefTRIAXone  (ROCEPHIN ) 1 g in sodium chloride  0.9 % 100 mL IVPB  Status:  Discontinued        1 g 200 mL/hr over 30 Minutes Intravenous Every 24 hours 12/05/23 0034 12/05/23 0039   12/05/23 0045  azithromycin  (ZITHROMAX ) 500 mg in sodium chloride  0.9 % 250 mL IVPB  Status:  Discontinued        500 mg 250 mL/hr over 60 Minutes Intravenous Every 24 hours 12/05/23 0034 12/05/23 1647         Subjective: Patient lying in bed.  Denies any significant chest pain or significant  shortness of breath.  Noted to have increased O2 requirements overnight going up to 15 L high flow nasal cannula currently on 13 L high flow nasal cannula.  No abdominal pain.  States she gets short of breath on minimal exertion.  Mother at bedside.  Objective: Vitals:   12/07/23 0700 12/07/23 0756 12/07/23 0800 12/07/23 0830  BP: (!) 150/90  (!) 157/80   Pulse: (!) 111  87 86  Resp: (!) 31  (!) 30 (!) 29  Temp:  98.5 F (36.9 C)    TempSrc:  Oral    SpO2: 96%  91% 93%  Weight:      Height:        Intake/Output Summary (Last 24 hours) at 12/07/2023 0934 Last data filed at 12/06/2023 1000 Gross per 24 hour  Intake --  Output 600 ml  Net -600 ml   Filed Weights   12/05/23 0237  Weight: 82 kg    Examination:  General exam: Appears calm and comfortable.  Poor dentition. Respiratory system: Decreased breath sounds in the bases.  Decreased coarse breath sounds on the right.  Inspiratory and expiratory wheezing.  Fair air movement.  No use of accessory muscles of respiration.  Speaking in full sentences.  Currently on 13 L high  flow nasal cannula.  Cardiovascular system: Regular rate rhythm no murmurs rubs or gallops.  No JVD.  No pitting lower extremity edema.  Gastrointestinal system: Abdomen is soft, nontender, nondistended, positive bowel sounds.  No rebound.  No guarding.  Central nervous system: Alert and oriented. No focal neurological deficits. Extremities: Symmetric 5 x 5 power. Skin: No rashes, lesions or ulcers Psychiatry: Judgement and insight appear normal. Mood & affect appropriate.     Data Reviewed: I have personally reviewed following labs and imaging studies  CBC: Recent Labs  Lab 12/04/23 2257 12/05/23 0541 12/06/23 0252 12/07/23 0254  WBC 15.4* 11.5* 14.8* 16.5*  NEUTROABS  --   --  11.6* 10.0*  HGB 14.6 13.9 13.6 13.9  HCT 40.7 40.8 41.1 40.8  MCV 96.7 99.3 101.0* 101.2*  PLT 211 168 165 179    Basic Metabolic Panel: Recent Labs  Lab  12/04/23 2257 12/05/23 0541 12/06/23 0252 12/07/23 0254  NA 131* 133* 133* 138  K 3.5 3.6 3.5 3.3*  CL 95* 98 99 101  CO2 22 23 25 26   GLUCOSE 474* 388* 335* 168*  BUN 5* 7 9 10   CREATININE 0.65 0.61 0.42* 0.43*  CALCIUM  9.3 8.6* 8.4* 8.6*  MG  --   --  2.1 2.2  PHOS  --   --  2.8 3.2    GFR: Estimated Creatinine Clearance: 94 mL/min (A) (by C-G formula based on SCr of 0.43 mg/dL (L)).  Liver Function Tests: Recent Labs  Lab 12/05/23 0541 12/06/23 0252 12/07/23 0254  AST 22  --   --   ALT 18  --   --   ALKPHOS 113  --   --   BILITOT 1.0  --   --   PROT 7.9  --   --   ALBUMIN 3.3* 3.0* 2.8*    CBG: Recent Labs  Lab 12/06/23 0756 12/06/23 1152 12/06/23 1618 12/06/23 2140 12/07/23 0752  GLUCAP 364* 292* 224* 227* 176*     Recent Results (from the past 240 hours)  Resp panel by RT-PCR (RSV, Flu A&B, Covid) Anterior Nasal Swab     Status: None   Collection Time: 12/04/23 10:57 PM   Specimen: Anterior Nasal Swab  Result Value Ref Range Status   SARS Coronavirus 2 by RT PCR NEGATIVE NEGATIVE Final    Comment: (NOTE) SARS-CoV-2 target nucleic acids are NOT DETECTED.  The SARS-CoV-2 RNA is generally detectable in upper respiratory specimens during the acute phase of infection. The lowest concentration of SARS-CoV-2 viral copies this assay can detect is 138 copies/mL. A negative result does not preclude SARS-Cov-2 infection and should not be used as the sole basis for treatment or other patient management decisions. A negative result may occur with  improper specimen collection/handling, submission of specimen other than nasopharyngeal swab, presence of viral mutation(s) within the areas targeted by this assay, and inadequate number of viral copies(<138 copies/mL). A negative result must be combined with clinical observations, patient history, and epidemiological information. The expected result is Negative.  Fact Sheet for Patients:   BloggerCourse.com  Fact Sheet for Healthcare Providers:  SeriousBroker.it  This test is no t yet approved or cleared by the [place]  FDA and  has been authorized for detection and/or diagnosis of SARS-CoV-2 by FDA under an Emergency Use Authorization (EUA). This EUA will remain  in effect (meaning this test can be used) for the duration of the COVID-19 declaration under Section 564(b)(1) of the Act, 21 U.S.C.section 360bbb-3(b)(1), unless the authorization  is terminated  or revoked sooner.       Influenza A by PCR NEGATIVE NEGATIVE Final   Influenza B by PCR NEGATIVE NEGATIVE Final    Comment: (NOTE) The Xpert Xpress SARS-CoV-2/FLU/RSV plus assay is intended as an aid in the diagnosis of influenza from Nasopharyngeal swab specimens and should not be used as a sole basis for treatment. Nasal washings and aspirates are unacceptable for Xpert Xpress SARS-CoV-2/FLU/RSV testing.  Fact Sheet for Patients: BloggerCourse.com  Fact Sheet for Healthcare Providers: SeriousBroker.it  This test is not yet approved or cleared by the [place]  FDA and has been authorized for detection and/or diagnosis of SARS-CoV-2 by FDA under an Emergency Use Authorization (EUA). This EUA will remain in effect (meaning this test can be used) for the duration of the COVID-19 declaration under Section 564(b)(1) of the Act, 21 U.S.C. section 360bbb-3(b)(1), unless the authorization is terminated or revoked.     Resp Syncytial Virus by PCR NEGATIVE NEGATIVE Final    Comment: (NOTE) Fact Sheet for Patients: BloggerCourse.com  Fact Sheet for Healthcare Providers: SeriousBroker.it  This test is not yet approved or cleared by the [place]  FDA and has been authorized for detection and/or diagnosis of SARS-CoV-2 by FDA under an Emergency Use  Authorization (EUA). This EUA will remain in effect (meaning this test can be used) for the duration of the COVID-19 declaration under Section 564(b)(1) of the Act, 21 U.S.C. section 360bbb-3(b)(1), unless the authorization is terminated or revoked.  Performed at [facility], [address] [place], [place] [address]   MRSA Next Gen by PCR, Nasal     Status: None   Collection Time: 12/05/23  2:25 AM   Specimen: Nasal Mucosa; Nasal Swab  Result Value Ref Range Status   MRSA by PCR Next Gen NOT DETECTED NOT DETECTED Final    Comment: (NOTE) The GeneXpert MRSA Assay (FDA approved for NASAL specimens only), is one component of a comprehensive MRSA colonization surveillance program. It is not intended to diagnose MRSA infection nor to guide or monitor treatment for MRSA infections. Test performance is not FDA approved in patients less than 40 years old. Performed at [facility], [address], [place], [place] [address]   Culture, blood (Routine X 2) w Reflex to ID Panel     Status: None (Preliminary result)   Collection Time: 12/05/23  5:41 AM   Specimen: BLOOD LEFT ARM  Result Value Ref Range Status   Specimen Description   Final    BLOOD LEFT ARM Performed at [facility], [address] [place], [place] [address]    Special Requests   Final    BOTTLES DRAWN AEROBIC AND ANAEROBIC Blood Culture adequate volume Performed at [facility], [address] [place], [place] [address]    Culture   Final    NO GROWTH 2 DAYS Performed at [facility], [address], [place], [place] [address]    Report Status PENDING  Incomplete  Culture, blood (Routine X 2) w Reflex to ID Panel     Status: None (Preliminary result)   Collection Time: 12/05/23  5:41 AM   Specimen: BLOOD LEFT ARM  Result Value Ref Range Status   Specimen Description   Final    BLOOD LEFT ARM Performed at [facility], [address] [place], [place] [address]    Special Requests   Final    BOTTLES DRAWN AEROBIC AND ANAEROBIC Blood Culture  adequate volume Performed at [facility], [address], [place], [place] [address]    Culture   Final    NO GROWTH 2 DAYS Performed at [facility], [address] [place], [place] [address]    Report Status PENDING  Incomplete  Expectorated Sputum Assessment w Gram Stain, Rflx to Resp Cult     Status: None   Collection Time: 12/05/23  1:29 PM   Specimen: Sputum  Result Value Ref Range Status   Specimen Description SPUTUM  Final   Special Requests NONE  Final   Sputum evaluation   Final    Sputum specimen not acceptable for testing.  Please recollect.   NOTIFIED H. SCHULTZ,RN ON 12/05/2023 AT 1347 BY SL Performed at [facility], [address], [place], [place] [address]    Report Status 12/05/2023 FINAL  Final  Respiratory (~20 pathogens) panel by PCR     Status: Abnormal   Collection Time: 12/05/23  1:29 PM   Specimen: Nasopharyngeal Swab; Respiratory  Result Value Ref Range Status   Adenovirus NOT DETECTED NOT DETECTED Final   Coronavirus 229E NOT DETECTED NOT DETECTED Final    Comment: (NOTE) The Coronavirus on the Respiratory Panel, DOES NOT test for the novel  Coronavirus (2019 nCoV)    Coronavirus HKU1 NOT DETECTED NOT DETECTED Final   Coronavirus NL63 NOT DETECTED NOT DETECTED Final   Coronavirus OC43 NOT DETECTED NOT DETECTED Final   Metapneumovirus NOT DETECTED NOT DETECTED Final   Rhinovirus / Enterovirus NOT DETECTED NOT DETECTED Final   Influenza A NOT DETECTED NOT DETECTED Final   Influenza B NOT DETECTED NOT DETECTED Final   Parainfluenza Virus 1 NOT DETECTED NOT DETECTED Final   Parainfluenza Virus 2 NOT DETECTED NOT DETECTED Final   Parainfluenza Virus 3 DETECTED (A) NOT DETECTED Final   Parainfluenza Virus 4 NOT DETECTED NOT DETECTED Final   Respiratory Syncytial Virus NOT DETECTED NOT DETECTED Final    Bordetella pertussis NOT DETECTED NOT DETECTED Final   Bordetella Parapertussis NOT DETECTED NOT DETECTED Final   Chlamydophila pneumoniae NOT DETECTED NOT DETECTED Final   Mycoplasma pneumoniae NOT DETECTED NOT DETECTED Final    Comment: Performed at [facility], [address] [place], [place] [address]         Radiology Studies: DG CHEST PORT 1 VIEW Result Date: 12/06/2023 EXAM: 1 VIEW XRAY OF THE CHEST 12/06/2023 08:09:03 AM COMPARISON: None available. CLINICAL HISTORY: Hypoxia, patient with parainfluenza virus; Pt is a smoker, hx of Asthma. FINDINGS: LUNGS AND PLEURA: Patchy interstitial airspace opacities are superimposed on chronic changes of COPD. Findings are consistent with a viral pneumonitis. No pleural effusion. No pneumothorax. HEART AND MEDIASTINUM: No acute abnormality of the cardiac and mediastinal silhouettes. BONES AND SOFT TISSUES: No acute osseous abnormality. IMPRESSION: 1. Findings consistent with viral pneumonitis, superimposed on chronic changes of COPD. Electronically signed by: Lonni Necessary MD 12/06/2023 11:35 AM EDT RP Workstation: [id number]        Scheduled Meds:  amLODipine   5 mg Oral Daily   budesonide  (PULMICORT ) nebulizer solution  0.5 mg Nebulization BID   Chlorhexidine  Gluconate Cloth  6 each Topical Daily   enoxaparin  (LOVENOX ) injection  40 mg Subcutaneous Q24H   famotidine   20 mg Oral BID   fluticasone   2 spray Each Nare Daily   guaiFENesin   1,200 mg Oral BID   insulin  aspart  0-20 Units Subcutaneous TID WC   insulin  aspart  0-5 Units Subcutaneous QHS  insulin  aspart  6 Units Subcutaneous TID WC   insulin  glargine-yfgn  34 Units Subcutaneous Daily   ipratropium-albuterol   3 mL Nebulization Q4H   lamoTRIgine   150 mg Oral Daily   levothyroxine   75 mcg Oral Q0600   loratadine   10 mg Oral Daily   nicotine   21 mg Transdermal Daily   pantoprazole   40 mg Oral Daily   PARoxetine   10 mg Oral QHS   predniSONE   40 mg Oral Q breakfast    Followed by   NOREEN ON 12/09/2023] predniSONE   30 mg Oral Q breakfast   Followed by   NOREEN ON 12/12/2023] predniSONE   20 mg Oral Q breakfast   Followed by   NOREEN ON 12/15/2023] predniSONE   10 mg Oral Q breakfast   Followed by   NOREEN ON 12/18/2023] predniSONE   5 mg Oral Q breakfast   propranolol   20 mg Oral TID   rosuvastatin   5 mg Oral Daily   ziprasidone   60 mg Oral QHS   Continuous Infusions:     LOS: 2 days    Time spent: 45 minutes    Toribio Hummer, MD [facility]   To contact the attending provider between 7A-7P or the covering provider during after hours 7P-7A, please log into the web site [email or web address] and access using universal [place] password for that web site. If you do not have the password, please call the hospital operator.  12/07/2023, 9:34 AM

## 2023-12-07 NOTE — Telephone Encounter (Signed)
 She is at [employer]. I will be rounding on her

## 2023-12-07 NOTE — Plan of Care (Signed)
  Problem: Education: Goal: Knowledge of General Education information will improve Description: Including pain rating scale, medication(s)/side effects and non-pharmacologic comfort measures Outcome: Progressing   Problem: Clinical Measurements: Goal: Cardiovascular complication will be avoided Outcome: Progressing   Problem: Coping: Goal: Level of anxiety will decrease Outcome: Progressing   Problem: Clinical Measurements: Goal: Respiratory complications will improve Outcome: Not Progressing

## 2023-12-08 ENCOUNTER — Inpatient Hospital Stay (HOSPITAL_COMMUNITY)

## 2023-12-08 DIAGNOSIS — B348 Other viral infections of unspecified site: Secondary | ICD-10-CM | POA: Diagnosis not present

## 2023-12-08 DIAGNOSIS — J411 Mucopurulent chronic bronchitis: Secondary | ICD-10-CM | POA: Diagnosis not present

## 2023-12-08 DIAGNOSIS — J189 Pneumonia, unspecified organism: Secondary | ICD-10-CM | POA: Diagnosis not present

## 2023-12-08 DIAGNOSIS — E119 Type 2 diabetes mellitus without complications: Secondary | ICD-10-CM | POA: Diagnosis not present

## 2023-12-08 DIAGNOSIS — J45901 Unspecified asthma with (acute) exacerbation: Secondary | ICD-10-CM | POA: Diagnosis not present

## 2023-12-08 DIAGNOSIS — I10 Essential (primary) hypertension: Secondary | ICD-10-CM | POA: Diagnosis not present

## 2023-12-08 DIAGNOSIS — F17210 Nicotine dependence, cigarettes, uncomplicated: Secondary | ICD-10-CM | POA: Diagnosis not present

## 2023-12-08 DIAGNOSIS — J9601 Acute respiratory failure with hypoxia: Secondary | ICD-10-CM | POA: Diagnosis not present

## 2023-12-08 DIAGNOSIS — F17200 Nicotine dependence, unspecified, uncomplicated: Secondary | ICD-10-CM | POA: Diagnosis not present

## 2023-12-08 DIAGNOSIS — K745 Biliary cirrhosis, unspecified: Secondary | ICD-10-CM | POA: Diagnosis not present

## 2023-12-08 DIAGNOSIS — K219 Gastro-esophageal reflux disease without esophagitis: Secondary | ICD-10-CM | POA: Diagnosis not present

## 2023-12-08 DIAGNOSIS — F411 Generalized anxiety disorder: Secondary | ICD-10-CM | POA: Diagnosis not present

## 2023-12-08 DIAGNOSIS — E039 Hypothyroidism, unspecified: Secondary | ICD-10-CM | POA: Diagnosis not present

## 2023-12-08 DIAGNOSIS — A419 Sepsis, unspecified organism: Secondary | ICD-10-CM | POA: Diagnosis not present

## 2023-12-08 DIAGNOSIS — F332 Major depressive disorder, recurrent severe without psychotic features: Secondary | ICD-10-CM | POA: Diagnosis not present

## 2023-12-08 LAB — GLUCOSE, CAPILLARY
Glucose-Capillary: 329 mg/dL — ABNORMAL HIGH (ref 70–99)
Glucose-Capillary: 378 mg/dL — ABNORMAL HIGH (ref 70–99)
Glucose-Capillary: 365 mg/dL — ABNORMAL HIGH (ref 70–99)
Glucose-Capillary: 374 mg/dL — ABNORMAL HIGH (ref 70–99)

## 2023-12-08 LAB — CBC WITH DIFFERENTIAL/PLATELET
Abs Immature Granulocytes: 0.2 K/uL — ABNORMAL HIGH (ref 0.00–0.07)
Basophils Absolute: 0 K/uL (ref 0.0–0.1)
Basophils Relative: 0 %
Eosinophils Absolute: 0 K/uL (ref 0.0–0.5)
Eosinophils Relative: 0 %
HCT: 38.2 % (ref 36.0–46.0)
Hemoglobin: 13.1 g/dL (ref 12.0–15.0)
Immature Granulocytes: 2 %
Lymphocytes Relative: 21 %
Lymphs Abs: 2.3 K/uL (ref 0.7–4.0)
MCH: 34.5 pg — ABNORMAL HIGH (ref 26.0–34.0)
MCHC: 34.3 g/dL (ref 30.0–36.0)
MCV: 100.5 fL — ABNORMAL HIGH (ref 80.0–100.0)
Monocytes Absolute: 0.2 K/uL (ref 0.1–1.0)
Monocytes Relative: 1 %
Neutro Abs: 8.6 K/uL — ABNORMAL HIGH (ref 1.7–7.7)
Neutrophils Relative %: 76 %
Platelets: 168 K/uL (ref 150–400)
RBC: 3.8 MIL/uL — ABNORMAL LOW (ref 3.87–5.11)
RDW: 15.5 % (ref 11.5–15.5)
WBC: 11.3 K/uL — ABNORMAL HIGH (ref 4.0–10.5)
nRBC: 0 % (ref 0.0–0.2)

## 2023-12-08 LAB — SEDIMENTATION RATE: Sed Rate: 84 mm/h — ABNORMAL HIGH (ref 0–22)

## 2023-12-08 LAB — RENAL FUNCTION PANEL
Albumin: 2.7 g/dL — ABNORMAL LOW (ref 3.5–5.0)
Anion gap: 11 (ref 5–15)
BUN: 16 mg/dL (ref 6–20)
CO2: 23 mmol/L (ref 22–32)
Calcium: 8.4 mg/dL — ABNORMAL LOW (ref 8.9–10.3)
Chloride: 101 mmol/L (ref 98–111)
Creatinine, Ser: 0.54 mg/dL (ref 0.44–1.00)
GFR, Estimated: >60 mL/min (ref >60)
Glucose, Bld: 277 mg/dL — ABNORMAL HIGH (ref 70–99)
Phosphorus: 3.5 mg/dL (ref 2.5–4.6)
Potassium: 4.1 mmol/L (ref 3.5–5.1)
Sodium: 135 mmol/L (ref 135–145)

## 2023-12-08 LAB — MAGNESIUM: Magnesium: 2.5 mg/dL — ABNORMAL HIGH (ref 1.7–2.4)

## 2023-12-08 LAB — BRAIN NATRIURETIC PEPTIDE: B Natriuretic Peptide: 32.9 pg/mL (ref 0.0–100.0)

## 2023-12-08 LAB — PROCALCITONIN: Procalcitonin: 0.15 ng/mL

## 2023-12-08 MED ORDER — SODIUM CHLORIDE 0.9 % IV SOLN
500.0000 mg | INTRAVENOUS | Status: DC
  Administered 2023-12-08 – 2023-12-11 (×4): 500 mg via INTRAVENOUS
  Filled 2023-12-08 (×5): qty 5

## 2023-12-08 MED ORDER — AMLODIPINE BESYLATE 5 MG PO TABS
5.0000 mg | ORAL_TABLET | Freq: Once | ORAL | Status: AC
  Administered 2023-12-08: 5 mg via ORAL
  Filled 2023-12-08: qty 1

## 2023-12-08 MED ORDER — AMLODIPINE BESYLATE 10 MG PO TABS
10.0000 mg | ORAL_TABLET | Freq: Every day | ORAL | Status: DC
  Administered 2023-12-09 – 2023-12-13 (×5): 10 mg via ORAL
  Filled 2023-12-08 (×5): qty 1

## 2023-12-08 MED ORDER — FUROSEMIDE 10 MG/ML IJ SOLN
40.0000 mg | Freq: Once | INTRAMUSCULAR | Status: AC
  Administered 2023-12-08: 40 mg via INTRAVENOUS
  Filled 2023-12-08: qty 4

## 2023-12-08 MED ORDER — INSULIN GLARGINE-YFGN 100 UNIT/ML ~~LOC~~ SOLN
40.0000 [IU] | Freq: Every day | SUBCUTANEOUS | Status: DC
  Administered 2023-12-08 – 2023-12-13 (×6): 40 [IU] via SUBCUTANEOUS
  Filled 2023-12-08 (×6): qty 0.4

## 2023-12-08 NOTE — Progress Notes (Signed)
 NAME:  Emily [name], MRN:  [id number], DOB:  09/17/1980, LOS: 3 ADMISSION DATE:  12/04/2023, CONSULTATION DATE:  12/05/23 REFERRING MD:  Toribio Hummer, MD CHIEF COMPLAINT:  Pneumonia   History of Present Illness:  Emily [name] is a 43 year old woman, daily smoker with asthma, DMII, hypertension, hypothyroidism, bipolar disorder, cirrhosis and lupus who is admitted for acute hypoxemic respiratory failure due to pneumonia.   She reports 1 week of progressive dyspnea, cough and wheezing. She was treated with Zpak and prednisone  at an Urgent Care recently but her symptoms did not improve after being on therapy for 2 days.   She is followed by Dr. Geronimo in pulmonary clinic for asthma and lung nodules. Her asthma regimen includes advair diskus 250-50mcg 1 puff twice daily, montelukast  10mg  daily and as needed albuterol .   She is smoking 1 pack per day. She has history of positive ANA testing.  Pertinent  Medical History    has a past medical history of Abnormal CT of liver, Anxiety, Asthma, Asthma, Bipolar 1 disorder (HCC), Bipolar disorder (HCC), Chronic pain, Cirrhosis (HCC), Depression, Diabetes mellitus without complication (HCC), Fatty liver, Gallstones, GERD (gastroesophageal reflux disease), Headache(784.0), Hyperlipidemia, Hypertension, Hypothyroidism, Lupus (systemic lupus erythematosus) (HCC), Pulmonary nodule, Tachycardia, Thyroid disease, and Vitamin D  deficiency.   has a past surgical history that includes Dilation and curettage of uterus and Upper gi endoscopy (08/28/2022).    Significant Hospital Events: Including procedures, antibiotic start and stop dates in addition to other pertinent events   8/16 admitted for respiratory failure and pneumonia, Viral panel positive for parainfluenza virus 8/17  - She is tearful this morning and wanting to go home. Mother at bedside. She has been weaned to 8L O2 8/1: HFNC 12L . Afebrile. Pulse ox 88-91%. PAtient and mom at bedside.  She is very fearful of ending up oin ventilator and dying. She is in tears. Refused BiPAP but after counseling agreed to try on. Also feels beathin worse afte rIV steroids changed to po per patient and mom.  Mom reports multiple family members sick  Interim History / Subjective:   8/19 - used BiPAP last night and day. On15L HHFNC. Feeling better. On IV Solumedrol since yesterday. Wantiung antibiotics. Not interested in doxy. ESR 80s   Objective    Blood pressure (!) 149/89, pulse 95, temperature 97.6 F (36.4 C), temperature source Axillary, resp. rate (!) 30, height 5' 3.5 (1.613 m), weight 82 kg, last menstrual period 10/29/2023, SpO2 96%.    FiO2 (%):  [60 %-75 %] 60 % PEEP:  [5 cmH20] 5 cmH20   Intake/Output Summary (Last 24 hours) at 12/08/2023 1115 Last data filed at 12/07/2023 1818 Gross per 24 hour  Intake --  Output 500 ml  Net -500 ml    Filed Weights   12/05/23 0237  Weight: 82 kg    Examination:  General Appearance:  Looks much better. OBESE + Head:  Normocephalic, without obvious abnormality, atraumatic Eyes:  PERRL - yes, conjunctiva/corneas - mudd     Ears:  Normal external ear canals, both ears Nose:  G tube - no Throat:  ETT TUBE - no , OG tube - no. BIPA + Neck:  Supple,  No enlargement/tenderness/nodules Lungs: Clear to auscultation bilaterally, Heart:  S1 and S2 normal, no murmur, CVP - no.  Pressors - no Abdomen:  Soft, no masses, no organomegaly Genitalia / Rectal:  Not done Extremities:  Extremities- intac Skin:  ntact in exposed areas . Sacral area - not examined  Neurologic:  Sedation - none -> RASS - +1 . Moves all 4s - yes. CAM-ICU - neg . Orientation - x3+       Resolved problem list   Assessment and Plan  Acute Hypoxemic Respiratory Failure Sepsis due to Viral Pneumonia, Parainfluenza Asthma with Acute Exacerbation Cigarette Smoker DMII Cirrhosis Hx of Lupus  Patient appears to have acute infectious pneumonia given history of  sick contacts with exacerbation of her asthma with positive parainfluenza viral test. Other differential includes autoimmune related ILD given her history of lupus. Low concern for thromboembolic disease given negative ddimer.   8/18 - behaving like Acute Lung Injury.  CT c/w ALI. RESTARTED IV SOlumderol , ESR 80s 12/08/23 - objectively same. REquesting antibiotics. Subjectively better    Plan: - lasix  x 1  dose by [facility]  - STart IV azithromycin  (shared decision making, patient quite keen) -continueT BiPAP at bedtime + 2 applicatioins in day time  - continue HHFNC at othe r times - IV solumedrol (her subjective request) 8/18 - 8/20 and then 8/21, on 8/20 - can reduce dose of IV and on 12/10/23 look look at taper to po - - Continue duonebs q4hours and as needed albuterol  - heavily encouraged smoking cessation -intubatge if worsens  Best Practice (right click and Reselect all SmartList Selections daily)   Diet/type: Regular consistency (see orders) DVT prophylaxis LMWH Pressure ulcer(s): N/A GI prophylaxis: N/A Lines: N/A Foley:  N/A Code Status:  full code Last date of multidisciplinary goals of care discussion [per primary team]   Mom and patient at bedside8/18, 8/19     ATTESTATION & SIGNATURE   The patient Emily [name] is critically ill with multiple organ systems failure and requires high complexity decision making for assessment and support, frequent evaluation and titration of therapies, application of advanced monitoring technologies and extensive interpretation of multiple databases and discussion with other appropriate health care personnel such as bedside nurses, social workers, case Production designer, theatre/television/film, consultants, respiratory therapists, nutritionists, secretaries etc.,  Critical care time includes but is not restricted to just documentation time. Documentation can happen in parallel or sequential to care time depending on case mix urgency and priorities for the shift. So,  overall critical Care Time devoted to patient care services described in this note is  35  Minutes.   This time reflects time of care of this signee Dr Dorethia Cave which includ does not reflect procedure time, or teaching time or supervisory time of PA/NP/Med student/Med Resident etc but could involve care discussion time     Dr. Dorethia Cave, M.D., [facility].C.P Pulmonary and Critical Care Medicine Staff Physician, [facility] [place] [facility] Pager: [phone], If no answer or between  15:00h - 7:00h: call [phone]  12/08/2023 11:33 AM      LABS    PULMONARY Recent Labs  Lab 12/05/23 0818  PHART 7.4  PCO2ART 46  PO2ART 65*  HCO3 28.5*  O2SAT 94.6    CBC Recent Labs  Lab 12/06/23 0252 12/07/23 0254 12/08/23 0400  HGB 13.6 13.9 13.1  HCT 41.1 40.8 38.2  WBC 14.8* 16.5* 11.3*  PLT 165 179 168    COAGULATION Recent Labs  Lab 12/05/23 0541  INR 1.1    CARDIAC  No results for input(s): TROPONINI in the last 168 hours. No results for input(s): PROBNP in the last 168 hours.   CHEMISTRY Recent Labs  Lab 12/04/23 2257 12/05/23 0541 12/06/23 0252 12/07/23 0254 12/08/23 0400  NA 131* 133* 133* 138 135  K 3.5 3.6 3.5 3.3* 4.1  CL 95* 98 99 101 101  CO2 22 23 25 26 23   GLUCOSE 474* 388* 335* 168* 277*  BUN 5* 7 9 10 16   CREATININE 0.65 0.61 0.42* 0.43* 0.54  CALCIUM  9.3 8.6* 8.4* 8.6* 8.4*  MG  --   --  2.1 2.2 2.5*  PHOS  --   --  2.8 3.2 3.5   Estimated Creatinine Clearance: 94 mL/min (by C-G formula based on SCr of 0.54 mg/dL).   LIVER Recent Labs  Lab 12/05/23 0541 12/06/23 0252 12/07/23 0254 12/08/23 0400  AST 22  --   --   --   ALT 18  --   --   --   ALKPHOS 113  --   --   --   BILITOT 1.0  --   --   --   PROT 7.9  --   --   --   ALBUMIN 3.3* 3.0* 2.8* 2.7*  INR 1.1  --   --   --      INFECTIOUS Recent Labs  Lab 12/05/23 0541 12/05/23 1145 12/08/23 0400  LATICACIDVEN 1.6  --   --    PROCALCITON  --  <0.10 0.15     ENDOCRINE CBG (last 3)  Recent Labs    12/07/23 1650 12/07/23 2143 12/08/23 0739  GLUCAP 334* 324* 329*         IMAGING x48h  - image(s) personally visualized  -   highlighted in bold DG CHEST PORT 1 VIEW Result Date: 12/08/2023 EXAM: 1 VIEW XRAY OF THE CHEST 12/08/2023 10:06:00 AM COMPARISON: Plain film and CT of yesterday. CLINICAL HISTORY: [id number] Acute respiratory failure with hypoxia (HCC) [id number]. Acute respiratory failure with hypoxia; unable to remove bra due to multiple lines. Acute respiratory failure with hypoxia; unable to remove bra due to multiple lines. FINDINGS: LUNGS AND PLEURA: Lower lung predominant, bilateral interstitial and airspace disease is minimally improved. Primary area of improvement is the upper lobes. No new pulmonary opacity. No pleural effusion. No pneumothorax. HEART AND MEDIASTINUM: No acute abnormality of the cardiac and mediastinal silhouettes. BONES AND SOFT TISSUES: No acute osseous abnormality. Multiple wires and leads project over the chest on the frontal radiograph. IMPRESSION: 1. Lower lung predominant, bilateral interstitial and airspace disease, minimally improved, with primary improvement in the upper lobes. No new pulmonary opacity. Electronically signed by: Rockey Kilts MD 12/08/2023 11:03 AM EDT RP Workstation: [id number]   CT CHEST WO CONTRAST Result Date: 12/07/2023 CLINICAL DATA:  Respiratory illness, nondiagnostic xray ? ARDS. EXAM: CT CHEST WITHOUT CONTRAST TECHNIQUE: Multidetector CT imaging of the chest was performed following the standard protocol without IV contrast. RADIATION DOSE REDUCTION: This exam was performed according to the departmental dose-optimization program which includes automated exposure control, adjustment of the mA and/or kV according to patient size and/or use of iterative reconstruction technique. COMPARISON:  CT scan chest from 08/03/2023. FINDINGS: Cardiovascular: Normal cardiac  size. No pericardial effusion. No aortic aneurysm. There are coronary artery calcifications, in keeping with coronary artery disease. Mediastinum/Nodes: Visualized thyroid gland appears grossly unremarkable. No solid / cystic mediastinal masses. The esophagus is nondistended precluding optimal assessment. Redemonstration of multiple mildly enlarged mediastinal lymph nodes with largest in the precarinal location measuring up to 1.3 x 2.4 cm. These are indeterminate in etiology but appears grossly unchanged since the prior study. No axillary lymphadenopathy by size criteria. Evaluation of bilateral hila is limited due to lack on intravenous  contrast: however, no large hilar lymphadenopathy identified. Lungs/Pleura: The central tracheo-bronchial tree is patent. There are diffuse ground-glass opacities throughout bilateral lungs with superimposed interlobular and interlobular septal thickening, compatible with crazy paving pattern. There are also associated superimposed areas of solid lung opacification bilaterally more in the dependent portions. This is nonspecific and differential diagnosis includes ARDS, multilobar pneumonia, pulmonary alveolar proteinosis, pulmonary edema, etc. Correlate clinically. No suspicious mass. No pleural effusion or pneumothorax. Evaluation for discrete lung nodule is limited due to extensive background lung parenchymal opacities. Upper Abdomen: Visualized upper abdominal viscera within normal limits. Musculoskeletal: There is an oval approximately 8 x 30 mm right retroareolar soft tissue nodule, incompletely characterized on the CT scan exam but grossly similar to the prior study. There is also an additional approximately 8 x 16 mm soft tissue attenuation nodule centered in the skin/subcutaneous tissue over the right lower anterior chest wall (series 2, image 106), incompletely characterized on the current exam but favored to represent epidermal inclusion cyst. Correlate with physical  examination. The visualized soft tissues of the chest wall are otherwise grossly unremarkable. No suspicious osseous lesions. IMPRESSION: 1. There are diffuse ground-glass opacities throughout bilateral lungs with superimposed interlobular and interlobular septal thickening, compatible with crazy paving pattern. There are also associated superimposed areas of solid lung opacification bilaterally more in the dependent portions. This is nonspecific and differential diagnosis includes ARDS, multilobar pneumonia, pulmonary alveolar proteinosis, pulmonary edema, etc. 2. Multiple other nonacute observations, as described above. Aortic Atherosclerosis (ICD10-I70.0). Electronically Signed   By: Ree Molt M.D.   On: 12/07/2023 11:34   DG CHEST PORT 1 VIEW Result Date: 12/07/2023 CLINICAL DATA:  Acute respiratory failure EXAM: PORTABLE CHEST 1 VIEW COMPARISON:  12/06/2023 FINDINGS: Cardiac shadow is within normal limits. Increasing airspace opacity is noted bilaterally. No sizable effusion is noted. No bony abnormality is seen. IMPRESSION: Worsening airspace opacity bilaterally. Electronically Signed   By: Oneil Devonshire M.D.   On: 12/07/2023 10:43

## 2023-12-08 NOTE — Progress Notes (Signed)
 PROGRESS NOTE    Emily [name]  FMW:[id number] DOB: 05-04-1980 DOA: 12/04/2023 PCP: [name], NP    Chief Complaint  Patient presents with   Chest Pain   Shortness of Breath    Brief Narrative:  Patient 43 year old female with history of asthma, chronic bronchitis and rhinitis, ongoing tobacco abuse, type 2 diabetes, hypertension, hyperlipidemia, hypothyroidism, lupus, cirrhosis, bipolar disorder, depression and anxiety, GERD who presented with complaints of worsening shortness of breath.  Chest x-ray done concerning for left lobe pneumonia.  Patient noted to be in hypoxic respiratory distress requiring 15 L high flow nasal cannula.  Patient placed on empiric IV antibiotics.  PCCM consulted.  Assessment & Plan:   Principal Problem:   Severe sepsis (HCC) Active Problems:   Acute hypoxemic respiratory failure (HCC)   Infection due to parainfluenza virus 3   TOBACCO ABUSE   Acute asthma exacerbation   G E REFLUX   Essential hypertension   Hypothyroidism   Type 2 diabetes mellitus (HCC)   Gastroesophageal reflux disease   Chronic bronchitis (HCC)   Generalized anxiety disorder   Major depressive disorder, recurrent severe without psychotic features (HCC)  #1 severe sepsis secondary to parainfluenza virus 3 pneumonia -Patient on admission met criteria for severe sepsis with tachypnea, leukocytosis, chest x-ray with patchy left mid and lower lung opacities concerning for pneumonia. - Blood cultures pending. - MRSA PCR negative. - SARS coronavirus 2 PCR negative, influenza A and B by PCR negative, RSV by PCR negative. -Respiratory viral panel positive for parainfluenza virus 3 -ABG with a pH of 7.4, pCO2 of 46, pO2 of 65, bicarb of 28.5. -D-dimer negative. - Sputum Gram stain and cultures pending. - Patient states some clinical improvement. - Patient still requiring high O2 requirements and currently on the BiPAP.  - Procalcitonin noted to be negative.   -  Patient seen by PCCM and felt patient's symptoms secondary to a viral pneumonia and as such antibiotics have been discontinued.  - Continue Pulmicort  twice daily, Brovana  twice daily, Flonase , scheduled DuoNebs, Claritin , PPI, Mucinex , IV Solu-Medro. -Continue BiPAP. -Patient with worsening hypoxia and as such CT chest ordered per PCCM was done which showed diffuse ground glass opacities throughout bilateral lungs with superimposed interlobular interlobular septal thickening compatible with crazy paving pattern.  Also associated superimposed areas of solid lung opacification bilaterally more in the dependent portions.  Nonspecific differential diagnosis includes ARDS, multilobar pneumonia, pulmonary alveolar proteinosis, pulmonary edema,.  -Will give a dose of Lasix  40 mg IV x 1 - Due to increased high O2 requirements patient seen in consultation by PCCM who are following and appreciate input and recommendations.   2.  Acute exacerbation of asthma/chronic bronchitis versus acute COPD exacerbation -Likely exacerbated by parainfluenza virus 3 infection/pneumonia. - Patient noted to have wheezing and rhonchi on examination on presentation with some clinical improvement with increased O2 requirements. - Was slowly clinically improving however patient now with increased O2 requirements currently on 13 L high flow nasal cannula. - Was on IV Solu-Medrol  and has been transitioned to a prednisone  taper per PCCM.   -Due to increasing O2 requirements patient placed back on IV Solu-Medrol  per PCCM.] - CT chest done per PCCM that showed diffuse ground glass opacities throughout bilateral lungs with superimposed interlobular interlobular septal thickening compatible with crazy paving pattern.  Also associated superimposed areas of solid lung opacification bilaterally more in the dependent portions.  Nonspecific differential diagnosis includes ARDS, multilobar pneumonia, pulmonary alveolar proteinosis, pulmonary  edema,.  -Will  give a dose of Lasix  40 mg IV x 1. - Continue scheduled DuoNebs, Pulmicort , Brovana , Mucinex , Claritin , Flonase , PPI.   -Patient on BiPAP. - PCCM following and appreciate input and recommendations.   3.  Acute hypoxic respiratory failure -Likely secondary to problems #1 and 2 versus developing ALI. See #1 and 2. -Patient with increasing O2 requirements currently on the BiPAP.  -Respiratory viral panel positive for parainfluenza virus 3. - May need to consider CT chest due to worsening hypoxia however will defer to PCCM/pulmonary.  -Patient placed back on IV Solu-Medrol , BiPAP and CT chest done per PCCM that showed diffuse ground glass opacities throughout bilateral lungs with superimposed interlobular interlobular septal thickening compatible with crazy paving pattern.  Also associated superimposed areas of solid lung opacification bilaterally more in the dependent portions.  Nonspecific differential diagnosis includes ARDS, multilobar pneumonia, pulmonary alveolar proteinosis, pulmonary edema,.  -Will give a dose of Lasix  40 mg IV x 1. -Patient seen by PCCM who are following and appreciate their input and recommendations.  4.  Mild hyponatremia --Felt likely secondary to poor oral intake in the setting of acute illness. -Improved with hydration.  5.  Tobacco abuse -Tobacco cessation. - Nicotine  patch.  6.  Poorly controlled diabetes mellitus type 2 -Initial glucose of 474 on presentation in the setting of steroid use. - Hemoglobin A1c noted at 10.9 (05/20/2023). - CBG noted at 329 this morning. - Repeat hemoglobin A1c 9.6.  -Increase Semglee  to 40 units daily. -Patient currently on BiPAP and as such meal cover NovoLog  has been discontinued. -Continue resistance scale SSI. -Diabetes coordinator following.  7.  Cirrhosis -Patient with no signs of ascites or hepatic encephalopathy at this time. - LFTs within normal limits. - Continue propranolol .   8.   Hypothyroidism - Continue Synthroid .  9.  Hyperlipidemia - Continue Crestor .  10.  Bipolar disorder/anxiety/depression - Continue clonazepam  twice daily as needed.   11.  Hypertension - Increase Norvasc  to 10 mg daily.  - Continue propranolol .  12.  GERD - Continue PPI.    13.  SLE - Outpatient follow-up.  14.  Hypokalemia - Repleted.   - Potassium of 4.1.    DVT prophylaxis: Lovenox  Code Status: Full Family Communication: Updated patient and mother at bedside. Disposition: Likely home when clinically improved and cleared by pulmonary/PCCM.  Status is: Inpatient Remains inpatient appropriate because: Severity of illness   Consultants:  PCCM: Dr Kara 12/05/2023  Procedures:  Chest x-ray 12/04/2023, 12/06/2023 CT chest 12/07/2023  Antimicrobials:  Anti-infectives (From admission, onward)    Start     Dose/Rate Route Frequency Ordered Stop   12/05/23 0100  cefTRIAXone  (ROCEPHIN ) 2 g in sodium chloride  0.9 % 100 mL IVPB  Status:  Discontinued        2 g 200 mL/hr over 30 Minutes Intravenous Every 24 hours 12/05/23 0040 12/05/23 1647   12/05/23 0045  cefTRIAXone  (ROCEPHIN ) 1 g in sodium chloride  0.9 % 100 mL IVPB  Status:  Discontinued        1 g 200 mL/hr over 30 Minutes Intravenous Every 24 hours 12/05/23 0034 12/05/23 0039   12/05/23 0045  azithromycin  (ZITHROMAX ) 500 mg in sodium chloride  0.9 % 250 mL IVPB  Status:  Discontinued        500 mg 250 mL/hr over 60 Minutes Intravenous Every 24 hours 12/05/23 0034 12/05/23 1647         Subjective: Patient lying in bed.  On BiPAP.  She states that shortness of breath and cough  improved.  Denies any chest pain.  No abdominal pain.  Mother at bedside.    Objective: Vitals:   12/08/23 1000 12/08/23 1018 12/08/23 1026 12/08/23 1027  BP: (!) 145/89 (!) 145/89 (!) 149/89   Pulse: 90  90 95  Resp: (!) 25   (!) 30  Temp:      TempSrc:      SpO2: 92%   96%  Weight:      Height:        Intake/Output Summary (Last  24 hours) at 12/08/2023 1127 Last data filed at 12/07/2023 1818 Gross per 24 hour  Intake --  Output 500 ml  Net -500 ml   Filed Weights   12/05/23 0237  Weight: 82 kg    Examination:  General exam: Appears calm and comfortable.  Poor dentition.  On BiPAP. Respiratory system: Decreased breath sounds in the bases.  Decreased coarse breath sounds on the right.  Inspiratory and expiratory wheezing.  Fair air movement.  On BiPAP.  Cardiovascular system: RRR no murmurs rubs or gallops.  No JVD.  No pitting lower extremity edema. Gastrointestinal system: Abdomen is soft, nontender, nondistended, positive bowel sounds.  No rebound.  No guarding.  Central nervous system: Alert and oriented. No focal neurological deficits. Extremities: Symmetric 5 x 5 power. Skin: No rashes, lesions or ulcers Psychiatry: Judgement and insight appear normal. Mood & affect appropriate.     Data Reviewed: I have personally reviewed following labs and imaging studies  CBC: Recent Labs  Lab 12/04/23 2257 12/05/23 0541 12/06/23 0252 12/07/23 0254 12/08/23 0400  WBC 15.4* 11.5* 14.8* 16.5* 11.3*  NEUTROABS  --   --  11.6* 10.0* 8.6*  HGB 14.6 13.9 13.6 13.9 13.1  HCT 40.7 40.8 41.1 40.8 38.2  MCV 96.7 99.3 101.0* 101.2* 100.5*  PLT 211 168 165 179 168    Basic Metabolic Panel: Recent Labs  Lab 12/04/23 2257 12/05/23 0541 12/06/23 0252 12/07/23 0254 12/08/23 0400  NA 131* 133* 133* 138 135  K 3.5 3.6 3.5 3.3* 4.1  CL 95* 98 99 101 101  CO2 22 23 25 26 23   GLUCOSE 474* 388* 335* 168* 277*  BUN 5* 7 9 10 16   CREATININE 0.65 0.61 0.42* 0.43* 0.54  CALCIUM  9.3 8.6* 8.4* 8.6* 8.4*  MG  --   --  2.1 2.2 2.5*  PHOS  --   --  2.8 3.2 3.5    GFR: Estimated Creatinine Clearance: 94 mL/min (by C-G formula based on SCr of 0.54 mg/dL).  Liver Function Tests: Recent Labs  Lab 12/05/23 0541 12/06/23 0252 12/07/23 0254 12/08/23 0400  AST 22  --   --   --   ALT 18  --   --   --   ALKPHOS 113   --   --   --   BILITOT 1.0  --   --   --   PROT 7.9  --   --   --   ALBUMIN 3.3* 3.0* 2.8* 2.7*    CBG: Recent Labs  Lab 12/07/23 0752 12/07/23 1131 12/07/23 1650 12/07/23 2143 12/08/23 0739  GLUCAP 176* 326* 334* 324* 329*     Recent Results (from the past 240 hours)  Resp panel by RT-PCR (RSV, Flu A&B, Covid) Anterior Nasal Swab     Status: None   Collection Time: 12/04/23 10:57 PM   Specimen: Anterior Nasal Swab  Result Value Ref Range Status   SARS Coronavirus 2 by RT PCR NEGATIVE NEGATIVE Final  Comment: (NOTE) SARS-CoV-2 target nucleic acids are NOT DETECTED.  The SARS-CoV-2 RNA is generally detectable in upper respiratory specimens during the acute phase of infection. The lowest concentration of SARS-CoV-2 viral copies this assay can detect is 138 copies/mL. A negative result does not preclude SARS-Cov-2 infection and should not be used as the sole basis for treatment or other patient management decisions. A negative result may occur with  improper specimen collection/handling, submission of specimen other than nasopharyngeal swab, presence of viral mutation(s) within the areas targeted by this assay, and inadequate number of viral copies(<138 copies/mL). A negative result must be combined with clinical observations, patient history, and epidemiological information. The expected result is Negative.  Fact Sheet for Patients:  BloggerCourse.com  Fact Sheet for Healthcare Providers:  SeriousBroker.it  This test is no t yet approved or cleared by the [place]  FDA and  has been authorized for detection and/or diagnosis of SARS-CoV-2 by FDA under an Emergency Use Authorization (EUA). This EUA will remain  in effect (meaning this test can be used) for the duration of the COVID-19 declaration under Section 564(b)(1) of the Act, 21 U.S.C.section 360bbb-3(b)(1), unless the authorization is terminated  or revoked  sooner.       Influenza A by PCR NEGATIVE NEGATIVE Final   Influenza B by PCR NEGATIVE NEGATIVE Final    Comment: (NOTE) The Xpert Xpress SARS-CoV-2/FLU/RSV plus assay is intended as an aid in the diagnosis of influenza from Nasopharyngeal swab specimens and should not be used as a sole basis for treatment. Nasal washings and aspirates are unacceptable for Xpert Xpress SARS-CoV-2/FLU/RSV testing.  Fact Sheet for Patients: BloggerCourse.com  Fact Sheet for Healthcare Providers: SeriousBroker.it  This test is not yet approved or cleared by the [place]  FDA and has been authorized for detection and/or diagnosis of SARS-CoV-2 by FDA under an Emergency Use Authorization (EUA). This EUA will remain in effect (meaning this test can be used) for the duration of the COVID-19 declaration under Section 564(b)(1) of the Act, 21 U.S.C. section 360bbb-3(b)(1), unless the authorization is terminated or revoked.     Resp Syncytial Virus by PCR NEGATIVE NEGATIVE Final    Comment: (NOTE) Fact Sheet for Patients: BloggerCourse.com  Fact Sheet for Healthcare Providers: SeriousBroker.it  This test is not yet approved or cleared by the [place]  FDA and has been authorized for detection and/or diagnosis of SARS-CoV-2 by FDA under an Emergency Use Authorization (EUA). This EUA will remain in effect (meaning this test can be used) for the duration of the COVID-19 declaration under Section 564(b)(1) of the Act, 21 U.S.C. section 360bbb-3(b)(1), unless the authorization is terminated or revoked.  Performed at [facility], [address] [place], [place] [address]   MRSA Next Gen by PCR, Nasal     Status: None   Collection Time: 12/05/23  2:25 AM   Specimen: Nasal Mucosa; Nasal Swab  Result Value Ref Range Status   MRSA by PCR Next Gen NOT DETECTED NOT DETECTED  Final    Comment: (NOTE) The GeneXpert MRSA Assay (FDA approved for NASAL specimens only), is one component of a comprehensive MRSA colonization surveillance program. It is not intended to diagnose MRSA infection nor to guide or monitor treatment for MRSA infections. Test performance is not FDA approved in patients less than 67 years old. Performed at [facility], [address], [place], [place] [address]   Culture, blood (Routine X 2) w Reflex to ID Panel     Status: None (  Preliminary result)   Collection Time: 12/05/23  5:41 AM   Specimen: BLOOD LEFT ARM  Result Value Ref Range Status   Specimen Description   Final    BLOOD LEFT ARM Performed at [facility], [address], [place], [place] [address]    Special Requests   Final    BOTTLES DRAWN AEROBIC AND ANAEROBIC Blood Culture adequate volume Performed at [facility], [address], [place], [place] [address]    Culture   Final    NO GROWTH 3 DAYS Performed at [facility], [address], [place], [place] [address]    Report Status PENDING  Incomplete  Culture, blood (Routine X 2) w Reflex to ID Panel     Status: None (Preliminary result)   Collection Time: 12/05/23  5:41 AM   Specimen: BLOOD LEFT ARM  Result Value Ref Range Status   Specimen Description   Final    BLOOD LEFT ARM Performed at [facility], [address], [place], [place] [address]    Special Requests   Final    BOTTLES DRAWN AEROBIC AND ANAEROBIC Blood Culture adequate volume Performed at [facility], [address], [place], [place] [address]    Culture   Final    NO GROWTH 3 DAYS Performed at [facility], [address] [place], [place] [address]    Report Status PENDING  Incomplete  Expectorated Sputum Assessment w Gram Stain, Rflx to Resp Cult     Status: None   Collection Time: 12/05/23  1:29 PM   Specimen: Sputum  Result Value Ref Range Status    Specimen Description SPUTUM  Final   Special Requests NONE  Final   Sputum evaluation   Final    Sputum specimen not acceptable for testing.  Please recollect.   NOTIFIED H. SCHULTZ,RN ON 12/05/2023 AT 1347 BY SL Performed at [facility], [address] [place], [place] [address]    Report Status 12/05/2023 FINAL  Final  Respiratory (~20 pathogens) panel by PCR     Status: Abnormal   Collection Time: 12/05/23  1:29 PM   Specimen: Nasopharyngeal Swab; Respiratory  Result Value Ref Range Status   Adenovirus NOT DETECTED NOT DETECTED Final   Coronavirus 229E NOT DETECTED NOT DETECTED Final    Comment: (NOTE) The Coronavirus on the Respiratory Panel, DOES NOT test for the novel  Coronavirus (2019 nCoV)    Coronavirus HKU1 NOT DETECTED NOT DETECTED Final   Coronavirus NL63 NOT DETECTED NOT DETECTED Final   Coronavirus OC43 NOT DETECTED NOT DETECTED Final   Metapneumovirus NOT DETECTED NOT DETECTED Final   Rhinovirus / Enterovirus NOT DETECTED NOT DETECTED Final   Influenza A NOT DETECTED NOT DETECTED Final   Influenza B NOT DETECTED NOT DETECTED Final   Parainfluenza Virus 1 NOT DETECTED NOT DETECTED Final   Parainfluenza Virus 2 NOT DETECTED NOT DETECTED Final   Parainfluenza Virus 3 DETECTED (A) NOT DETECTED Final   Parainfluenza Virus 4 NOT DETECTED NOT DETECTED Final   Respiratory Syncytial Virus NOT DETECTED NOT DETECTED Final   Bordetella pertussis NOT DETECTED NOT DETECTED Final   Bordetella Parapertussis NOT DETECTED NOT DETECTED Final   Chlamydophila pneumoniae NOT DETECTED NOT DETECTED Final   Mycoplasma pneumoniae NOT DETECTED NOT DETECTED Final    Comment: Performed at [facility], [address], [place], [place] [address]         Radiology Studies: DG  CHEST PORT 1 VIEW Result Date: 12/08/2023 EXAM: 1 VIEW XRAY OF THE CHEST 12/08/2023 10:06:00 AM COMPARISON: Plain film and CT of yesterday. CLINICAL HISTORY: [id number] Acute respiratory failure  with hypoxia (HCC) [id number]. Acute respiratory failure with hypoxia; unable to remove bra due to multiple lines. Acute respiratory failure with hypoxia; unable to remove bra due to multiple lines. FINDINGS: LUNGS AND PLEURA: Lower lung predominant, bilateral interstitial and airspace disease is minimally improved. Primary area of improvement is the upper lobes. No new pulmonary opacity. No pleural effusion. No pneumothorax. HEART AND MEDIASTINUM: No acute abnormality of the cardiac and mediastinal silhouettes. BONES AND SOFT TISSUES: No acute osseous abnormality. Multiple wires and leads project over the chest on the frontal radiograph. IMPRESSION: 1. Lower lung predominant, bilateral interstitial and airspace disease, minimally improved, with primary improvement in the upper lobes. No new pulmonary opacity. Electronically signed by: Rockey Kilts MD 12/08/2023 11:03 AM EDT RP Workstation: [id number]   CT CHEST WO CONTRAST Result Date: 12/07/2023 CLINICAL DATA:  Respiratory illness, nondiagnostic xray ? ARDS. EXAM: CT CHEST WITHOUT CONTRAST TECHNIQUE: Multidetector CT imaging of the chest was performed following the standard protocol without IV contrast. RADIATION DOSE REDUCTION: This exam was performed according to the departmental dose-optimization program which includes automated exposure control, adjustment of the mA and/or kV according to patient size and/or use of iterative reconstruction technique. COMPARISON:  CT scan chest from 08/03/2023. FINDINGS: Cardiovascular: Normal cardiac size. No pericardial effusion. No aortic aneurysm. There are coronary artery calcifications, in keeping with coronary artery disease. Mediastinum/Nodes: Visualized thyroid gland appears grossly unremarkable. No solid / cystic mediastinal masses. The esophagus is nondistended precluding optimal assessment. Redemonstration of multiple mildly enlarged mediastinal lymph nodes with largest in the precarinal location measuring up to 1.3 x  2.4 cm. These are indeterminate in etiology but appears grossly unchanged since the prior study. No axillary lymphadenopathy by size criteria. Evaluation of bilateral hila is limited due to lack on intravenous contrast: however, no large hilar lymphadenopathy identified. Lungs/Pleura: The central tracheo-bronchial tree is patent. There are diffuse ground-glass opacities throughout bilateral lungs with superimposed interlobular and interlobular septal thickening, compatible with crazy paving pattern. There are also associated superimposed areas of solid lung opacification bilaterally more in the dependent portions. This is nonspecific and differential diagnosis includes ARDS, multilobar pneumonia, pulmonary alveolar proteinosis, pulmonary edema, etc. Correlate clinically. No suspicious mass. No pleural effusion or pneumothorax. Evaluation for discrete lung nodule is limited due to extensive background lung parenchymal opacities. Upper Abdomen: Visualized upper abdominal viscera within normal limits. Musculoskeletal: There is an oval approximately 8 x 30 mm right retroareolar soft tissue nodule, incompletely characterized on the CT scan exam but grossly similar to the prior study. There is also an additional approximately 8 x 16 mm soft tissue attenuation nodule centered in the skin/subcutaneous tissue over the right lower anterior chest wall (series 2, image 106), incompletely characterized on the current exam but favored to represent epidermal inclusion cyst. Correlate with physical examination. The visualized soft tissues of the chest wall are otherwise grossly unremarkable. No suspicious osseous lesions. IMPRESSION: 1. There are diffuse ground-glass opacities throughout bilateral lungs with superimposed interlobular and interlobular septal thickening, compatible with crazy paving pattern. There are also associated superimposed areas of solid lung opacification bilaterally more in the dependent portions. This is  nonspecific and differential diagnosis includes ARDS, multilobar pneumonia, pulmonary alveolar proteinosis, pulmonary edema, etc. 2. Multiple other nonacute observations, as described above. Aortic Atherosclerosis (ICD10-I70.0). Electronically Signed   By: Ree Molt  M.D.   On: 12/07/2023 11:34   DG CHEST PORT 1 VIEW Result Date: 12/07/2023 CLINICAL DATA:  Acute respiratory failure EXAM: PORTABLE CHEST 1 VIEW COMPARISON:  12/06/2023 FINDINGS: Cardiac shadow is within normal limits. Increasing airspace opacity is noted bilaterally. No sizable effusion is noted. No bony abnormality is seen. IMPRESSION: Worsening airspace opacity bilaterally. Electronically Signed   By: Oneil Devonshire M.D.   On: 12/07/2023 10:43        Scheduled Meds:  [START ON 12/09/2023] amLODipine   10 mg Oral Daily   amLODipine   5 mg Oral Once   budesonide  (PULMICORT ) nebulizer solution  0.5 mg Nebulization BID   Chlorhexidine  Gluconate Cloth  6 each Topical Daily   enoxaparin  (LOVENOX ) injection  40 mg Subcutaneous Q24H   famotidine   20 mg Oral BID   fluticasone   2 spray Each Nare Daily   guaiFENesin   1,200 mg Oral BID   insulin  aspart  0-20 Units Subcutaneous TID WC   insulin  aspart  0-5 Units Subcutaneous QHS   insulin  glargine-yfgn  40 Units Subcutaneous Daily   ipratropium-albuterol   3 mL Nebulization Q4H   lamoTRIgine   150 mg Oral Daily   levothyroxine   75 mcg Oral Q0600   loratadine   10 mg Oral Daily   methylPREDNISolone  (SOLU-MEDROL ) injection  125 mg Intravenous Q6H   nicotine   21 mg Transdermal Daily   pantoprazole   40 mg Oral Daily   PARoxetine   10 mg Oral QHS   propranolol   20 mg Oral TID   rosuvastatin   5 mg Oral Daily   ziprasidone   60 mg Oral QHS   Continuous Infusions:     LOS: 3 days    Time spent: 45 minutes    Toribio Hummer, MD [facility]   To contact the attending provider between 7A-7P or the covering provider during after hours 7P-7A, please log into the web site  [email or web address] and access using universal [place] password for that web site. If you do not have the password, please call the hospital operator.  12/08/2023, 11:27 AM

## 2023-12-08 NOTE — Progress Notes (Signed)
   12/07/23 2314  BiPAP/CPAP/SIPAP  BiPAP/CPAP/SIPAP Pt Type Adult  BiPAP/CPAP/SIPAP SERVO (air)  Mask Type Full face mask  Dentures removed? Not applicable  Mask Size Small  Set Rate 15 breaths/min  Respiratory Rate 32 breaths/min  IPAP 10 cmH20  EPAP 5 cmH2O  FiO2 (%) 65 %  Minute Ventilation 13.9  Leak 14  Peak Inspiratory Pressure (PIP) 17  Tidal Volume (Vt) 422  Patient Home Machine No  Patient Home Mask No  Patient Home Tubing No  Auto Titrate No  Press High Alarm 25 cmH2O  Device Plugged into RED Power Outlet Yes  BiPAP/CPAP /SiPAP Vitals  Bilateral Breath Sounds Inspiratory wheezes;Expiratory wheezes

## 2023-12-09 DIAGNOSIS — J9601 Acute respiratory failure with hypoxia: Secondary | ICD-10-CM | POA: Diagnosis not present

## 2023-12-09 DIAGNOSIS — K219 Gastro-esophageal reflux disease without esophagitis: Secondary | ICD-10-CM | POA: Diagnosis not present

## 2023-12-09 DIAGNOSIS — E119 Type 2 diabetes mellitus without complications: Secondary | ICD-10-CM | POA: Diagnosis not present

## 2023-12-09 DIAGNOSIS — E785 Hyperlipidemia, unspecified: Secondary | ICD-10-CM

## 2023-12-09 DIAGNOSIS — I10 Essential (primary) hypertension: Secondary | ICD-10-CM | POA: Diagnosis not present

## 2023-12-09 DIAGNOSIS — A419 Sepsis, unspecified organism: Secondary | ICD-10-CM | POA: Diagnosis not present

## 2023-12-09 DIAGNOSIS — F419 Anxiety disorder, unspecified: Secondary | ICD-10-CM | POA: Diagnosis not present

## 2023-12-09 DIAGNOSIS — R6520 Severe sepsis without septic shock: Secondary | ICD-10-CM | POA: Diagnosis not present

## 2023-12-09 DIAGNOSIS — E039 Hypothyroidism, unspecified: Secondary | ICD-10-CM | POA: Diagnosis not present

## 2023-12-09 LAB — GLUCOSE, CAPILLARY
Glucose-Capillary: 316 mg/dL — ABNORMAL HIGH (ref 70–99)
Glucose-Capillary: 301 mg/dL — ABNORMAL HIGH (ref 70–99)
Glucose-Capillary: 356 mg/dL — ABNORMAL HIGH (ref 70–99)
Glucose-Capillary: 295 mg/dL — ABNORMAL HIGH (ref 70–99)

## 2023-12-09 LAB — CBC WITH DIFFERENTIAL/PLATELET
Abs Immature Granulocytes: 0.13 K/uL — ABNORMAL HIGH (ref 0.00–0.07)
Basophils Absolute: 0 K/uL (ref 0.0–0.1)
Basophils Relative: 0 %
Eosinophils Absolute: 0 K/uL (ref 0.0–0.5)
Eosinophils Relative: 0 %
HCT: 40 % (ref 36.0–46.0)
Hemoglobin: 13.3 g/dL (ref 12.0–15.0)
Immature Granulocytes: 1 %
Lymphocytes Relative: 19 %
Lymphs Abs: 2.2 K/uL (ref 0.7–4.0)
MCH: 33.8 pg (ref 26.0–34.0)
MCHC: 33.3 g/dL (ref 30.0–36.0)
MCV: 101.8 fL — ABNORMAL HIGH (ref 80.0–100.0)
Monocytes Absolute: 0.3 K/uL (ref 0.1–1.0)
Monocytes Relative: 3 %
Neutro Abs: 8.7 K/uL — ABNORMAL HIGH (ref 1.7–7.7)
Neutrophils Relative %: 77 %
Platelets: 176 K/uL (ref 150–400)
RBC: 3.93 MIL/uL (ref 3.87–5.11)
RDW: 15.6 % — ABNORMAL HIGH (ref 11.5–15.5)
WBC: 11.3 K/uL — ABNORMAL HIGH (ref 4.0–10.5)
nRBC: 0.2 % (ref 0.0–0.2)

## 2023-12-09 LAB — MAGNESIUM: Magnesium: 2.4 mg/dL (ref 1.7–2.4)

## 2023-12-09 LAB — BASIC METABOLIC PANEL WITH GFR
Anion gap: 10 (ref 5–15)
BUN: 20 mg/dL (ref 6–20)
CO2: 27 mmol/L (ref 22–32)
Calcium: 8.6 mg/dL — ABNORMAL LOW (ref 8.9–10.3)
Chloride: 100 mmol/L (ref 98–111)
Creatinine, Ser: 0.41 mg/dL — ABNORMAL LOW (ref 0.44–1.00)
GFR, Estimated: >60 mL/min (ref >60)
Glucose, Bld: 270 mg/dL — ABNORMAL HIGH (ref 70–99)
Potassium: 3.5 mmol/L (ref 3.5–5.1)
Sodium: 137 mmol/L (ref 135–145)

## 2023-12-09 LAB — FOLATE: Folate: 6 ng/mL (ref >5.9)

## 2023-12-09 LAB — VITAMIN D 25 HYDROXY (VIT D DEFICIENCY, FRACTURES): Vit D, 25-Hydroxy: 44.87 ng/mL (ref 30–100)

## 2023-12-09 LAB — VITAMIN B12: Vitamin B-12: 252 pg/mL (ref 180–914)

## 2023-12-09 MED ORDER — VITAMIN C 500 MG PO TABS
500.0000 mg | ORAL_TABLET | Freq: Every day | ORAL | Status: DC
  Administered 2023-12-09 – 2023-12-13 (×5): 500 mg via ORAL
  Filled 2023-12-09 (×5): qty 1

## 2023-12-09 MED ORDER — INSULIN ASPART 100 UNIT/ML IJ SOLN
4.0000 [IU] | Freq: Three times a day (TID) | INTRAMUSCULAR | Status: DC
  Administered 2023-12-09 – 2023-12-12 (×9): 4 [IU] via SUBCUTANEOUS

## 2023-12-09 MED ORDER — FOLIC ACID 1 MG PO TABS
1.0000 mg | ORAL_TABLET | Freq: Every day | ORAL | Status: DC
  Administered 2023-12-09 – 2023-12-13 (×5): 1 mg via ORAL
  Filled 2023-12-09 (×5): qty 1

## 2023-12-09 MED ORDER — VITAMIN B-12 1000 MCG PO TABS: 1000.0000 ug | ORAL_TABLET | Freq: Every day | ORAL | Status: DC

## 2023-12-09 MED ORDER — METHYLPREDNISOLONE SODIUM SUCC 40 MG IJ SOLR
40.0000 mg | Freq: Every day | INTRAMUSCULAR | Status: DC
  Administered 2023-12-10 – 2023-12-12 (×3): 40 mg via INTRAVENOUS
  Filled 2023-12-09 (×2): qty 1

## 2023-12-09 MED ORDER — CYANOCOBALAMIN 1000 MCG/ML IJ SOLN
1000.0000 ug | Freq: Every day | INTRAMUSCULAR | Status: DC
  Administered 2023-12-09 – 2023-12-12 (×4): 1000 ug via INTRAMUSCULAR
  Filled 2023-12-09 (×5): qty 1

## 2023-12-09 MED ORDER — DEXMEDETOMIDINE HCL IN NACL 200 MCG/50ML IV SOLN
0.0000 ug/kg/h | INTRAVENOUS | Status: AC
Start: 2023-12-09 — End: 2023-12-10
  Administered 2023-12-09: 0.5 ug/kg/h via INTRAVENOUS
  Administered 2023-12-09: 0.4 ug/kg/h via INTRAVENOUS
  Filled 2023-12-09 (×3): qty 50

## 2023-12-09 NOTE — Plan of Care (Signed)
  Problem: Clinical Measurements: Goal: Respiratory complications will improve Outcome: Progressing   Problem: Activity: Goal: Risk for activity intolerance will decrease Outcome: Progressing   Problem: Nutrition: Goal: Adequate nutrition will be maintained Outcome: Progressing   Problem: Activity: Goal: Ability to tolerate increased activity will improve Outcome: Progressing   Problem: Respiratory: Goal: Ability to maintain adequate ventilation will improve Outcome: Progressing

## 2023-12-09 NOTE — Plan of Care (Signed)
   Problem: Education: Goal: Knowledge of General Education information will improve Description: Including pain rating scale, medication(s)/side effects and non-pharmacologic comfort measures Outcome: Progressing   Problem: Clinical Measurements: Goal: Ability to maintain clinical measurements within normal limits will improve Outcome: Progressing Goal: Respiratory complications will improve Outcome: Progressing   Problem: Activity: Goal: Risk for activity intolerance will decrease Outcome: Progressing   Problem: Coping: Goal: Level of anxiety will decrease Outcome: Progressing

## 2023-12-09 NOTE — Progress Notes (Signed)
 PROGRESS NOTE    Emily [name]  FMW:[id number] DOB: 1981-04-19 DOA: 12/04/2023 PCP: [name], NP    Chief Complaint  Patient presents with   Chest Pain   Shortness of Breath    Brief Narrative:  Patient 43 year old female with history of asthma, chronic bronchitis and rhinitis, ongoing tobacco abuse, type 2 diabetes, hypertension, hyperlipidemia, hypothyroidism, lupus, cirrhosis, bipolar disorder, depression and anxiety, GERD who presented with complaints of worsening shortness of breath.  Chest x-ray done concerning for left lobe pneumonia.  Patient noted to be in hypoxic respiratory distress requiring 15 L high flow nasal cannula.  Patient placed on empiric IV antibiotics.  PCCM consulted.  Assessment & Plan:   Principal Problem:   Severe sepsis (HCC) Active Problems:   Acute respiratory failure with hypoxia (HCC)   Infection due to parainfluenza virus 3   TOBACCO ABUSE   Acute asthma exacerbation   G E REFLUX   Essential hypertension   Hypothyroidism   Type 2 diabetes mellitus (HCC)   Gastroesophageal reflux disease   Chronic bronchitis (HCC)   Generalized anxiety disorder   Major depressive disorder, recurrent severe without psychotic features (HCC)  #1 severe sepsis secondary to parainfluenza virus 3 pneumonia -Patient on admission met criteria for severe sepsis with tachypnea, leukocytosis, chest x-ray with patchy left mid and lower lung opacities concerning for pneumonia. - Blood cultures pending. - MRSA PCR negative. - SARS coronavirus 2 PCR negative, influenza A and B by PCR negative, RSV by PCR negative. -Respiratory viral panel positive for parainfluenza virus 3 -ABG with a pH of 7.4, pCO2 of 46, pO2 of 65, bicarb of 28.5. -D-dimer negative. - Sputum Gram stain and cultures pending. - Patient states some clinical improvement. - Patient still requiring high O2 requirements and currently on the BiPAP.  - Procalcitonin noted to be negative.   -  Patient seen by PCCM and felt patient's symptoms secondary to a viral pneumonia and as such antibiotics have been discontinued.  - Continue Pulmicort  twice daily, Brovana  twice daily, Flonase , scheduled DuoNebs, Claritin , PPI, Mucinex , IV Solu-Medro. -Continue BiPAP. -Patient with worsening hypoxia and as such CT chest ordered per PCCM was done which showed diffuse ground glass opacities throughout bilateral lungs with superimposed interlobular interlobular septal thickening compatible with crazy paving pattern.  Also associated superimposed areas of solid lung opacification bilaterally more in the dependent portions.  Nonspecific differential diagnosis includes ARDS, multilobar pneumonia, pulmonary alveolar proteinosis, pulmonary edema,.  -Will give a dose of Lasix  40 mg IV x 1 - Due to increased high O2 requirements patient seen in consultation by PCCM who are following and appreciate input and recommendations.   2.  Acute exacerbation of asthma/chronic bronchitis versus acute COPD exacerbation -Likely exacerbated by parainfluenza virus 3 infection/pneumonia. - Patient noted to have wheezing and rhonchi on examination on presentation with some clinical improvement with increased O2 requirements. - Was slowly clinically improving however patient now with increased O2 requirements currently on 13 L high flow nasal cannula. - Was on IV Solu-Medrol  and has been transitioned to a prednisone  taper per PCCM.   -Due to increasing O2 requirements patient placed back on IV Solu-Medrol  per PCCM.] - CT chest done per PCCM that showed diffuse ground glass opacities throughout bilateral lungs with superimposed interlobular interlobular septal thickening compatible with crazy paving pattern.  Also associated superimposed areas of solid lung opacification bilaterally more in the dependent portions.  Nonspecific differential diagnosis includes ARDS, multilobar pneumonia, pulmonary alveolar proteinosis, pulmonary  edema,.  -  Will give a dose of Lasix  40 mg IV x 1. - Continue scheduled DuoNebs, Pulmicort , Brovana , Mucinex , Claritin , Flonase , PPI.   -Patient on BiPAP. - PCCM following and appreciate input and recommendations.   3.  Acute hypoxic respiratory failure -Likely secondary to problems #1 and 2 versus developing ALI. See #1 and 2. -Patient with increasing O2 requirements currently on the BiPAP.  -Respiratory viral panel positive for parainfluenza virus 3. - May need to consider CT chest due to worsening hypoxia however will defer to PCCM/pulmonary.  -Patient placed back on IV Solu-Medrol , BiPAP and CT chest done per PCCM that showed diffuse ground glass opacities throughout bilateral lungs with superimposed interlobular interlobular septal thickening compatible with crazy paving pattern.  Also associated superimposed areas of solid lung opacification bilaterally more in the dependent portions.  Nonspecific differential diagnosis includes ARDS, multilobar pneumonia, pulmonary alveolar proteinosis, pulmonary edema,.  -Will give a dose of Lasix  40 mg IV x 1. -Patient seen by PCCM who are following and appreciate their input and recommendations.  4.  Mild hyponatremia --Felt likely secondary to poor oral intake in the setting of acute illness. -Improved with hydration.  5.  Tobacco abuse -Tobacco cessation. - Nicotine  patch.  6.  Poorly controlled diabetes mellitus type 2 -Initial glucose of 474 on presentation in the setting of steroid use. - Hemoglobin A1c noted at 10.9 (05/20/2023). - CBG noted at 329 this morning. - Repeat hemoglobin A1c 9.6.  -Increase Semglee  to 40 units daily. -Patient currently on BiPAP and as such meal cover NovoLog  has been discontinued. -Continue resistance scale SSI. -Diabetes coordinator following.  7.  Cirrhosis -Patient with no signs of ascites or hepatic encephalopathy at this time. - LFTs within normal limits. - Continue propranolol .   8.   Hypothyroidism - Continue Synthroid .  9.  Hyperlipidemia - Continue Crestor .  10.  Bipolar disorder/anxiety/depression - Continue clonazepam  twice daily as needed.   11.  Hypertension - Increase Norvasc  to 10 mg daily.  - Continue propranolol .  12.  GERD - Continue PPI.    13.  SLE - Outpatient follow-up.  14.  Hypokalemia - Repleted.   - Potassium of 4.1.   # Macrocytosis possible secondary to B12 and folate deficiency. MCV 101.8 # Folic acid  level 6.0, at lower end, started folic acid  1 mg p.o. daily.  Follow with PCP repeat folic acid  level after 3 to 6 months. # Vitamin B12 level 252, goal >400.  Started vitamin B12 1000 mcg IM injection daily during hospital stay followed by oral supplement.  Follow with PCP to repeat vitamin B12 level after 3 to 6 months.     DVT prophylaxis: Lovenox  Code Status: Full Family Communication: Updated patient and mother at bedside. Disposition: Likely home when clinically improved and cleared by pulmonary/PCCM.  Status is: Inpatient Remains inpatient appropriate because: Severity of illness   Consultants:  PCCM: Dr Kara 12/05/2023  Procedures:  Chest x-ray 12/04/2023, 12/06/2023 CT chest 12/07/2023  Antimicrobials:  Anti-infectives (From admission, onward)    Start     Dose/Rate Route Frequency Ordered Stop   12/08/23 1400  azithromycin  (ZITHROMAX ) 500 mg in sodium chloride  0.9 % 250 mL IVPB        500 mg 250 mL/hr over 60 Minutes Intravenous Every 24 hours 12/08/23 1129     12/05/23 0100  cefTRIAXone  (ROCEPHIN ) 2 g in sodium chloride  0.9 % 100 mL IVPB  Status:  Discontinued        2 g 200 mL/hr over 30 Minutes  Intravenous Every 24 hours 12/05/23 0040 12/05/23 1647   12/05/23 0045  cefTRIAXone  (ROCEPHIN ) 1 g in sodium chloride  0.9 % 100 mL IVPB  Status:  Discontinued        1 g 200 mL/hr over 30 Minutes Intravenous Every 24 hours 12/05/23 0034 12/05/23 0039   12/05/23 0045  azithromycin  (ZITHROMAX ) 500 mg in sodium chloride   0.9 % 250 mL IVPB  Status:  Discontinued        500 mg 250 mL/hr over 60 Minutes Intravenous Every 24 hours 12/05/23 0034 12/05/23 1647         Subjective: Patient was seen and examined at bedside today. Patient was sitting comfortably on the recliner, slants refused her oxygen.  Still has shortness of breath but overall she feels improvement.  Denied any chest pain or palpitations, no any other active issues. Patient's family was at bedside.   Objective: Vitals:   12/09/23 1200 12/09/23 1202 12/09/23 1234 12/09/23 1300  BP: (!) 125/90   129/81  Pulse: 71  73 79  Resp: (!) 25  (!) 26 (!) 25  Temp:  98.4 F (36.9 C)    TempSrc:  Axillary    SpO2: 93%  96% 95%  Weight:      Height:        Intake/Output Summary (Last 24 hours) at 12/09/2023 1444 Last data filed at 12/09/2023 1313 Gross per 24 hour  Intake 445.89 ml  Output --  Net 445.89 ml   Filed Weights   12/05/23 0237  Weight: 82 kg    Examination:  General exam: Appears calm and comfortable.  Poor dentition.  On BiPAP. Respiratory system: Equal air entry bilaterally, bilateral crackles and mild wheezes. Cardiovascular system: RRR no murmurs rubs or gallops.  No JVD.  No pitting lower extremity edema. Gastrointestinal system: Abdomen is soft, nontender, nondistended, positive bowel sounds.  No rebound.  No guarding.  Central nervous system: Alert and oriented. No focal neurological deficits. Extremities: Symmetric 5 x 5 power. Skin: No rashes, lesions or ulcers Psychiatry: Judgement and insight appear normal. Mood & affect appropriate.     Data Reviewed: I have personally reviewed following labs and imaging studies  CBC: Recent Labs  Lab 12/05/23 0541 12/06/23 0252 12/07/23 0254 12/08/23 0400 12/09/23 0332  WBC 11.5* 14.8* 16.5* 11.3* 11.3*  NEUTROABS  --  11.6* 10.0* 8.6* 8.7*  HGB 13.9 13.6 13.9 13.1 13.3  HCT 40.8 41.1 40.8 38.2 40.0  MCV 99.3 101.0* 101.2* 100.5* 101.8*  PLT 168 165 179 168 176     Basic Metabolic Panel: Recent Labs  Lab 12/05/23 0541 12/06/23 0252 12/07/23 0254 12/08/23 0400 12/09/23 0332  NA 133* 133* 138 135 137  K 3.6 3.5 3.3* 4.1 3.5  CL 98 99 101 101 100  CO2 23 25 26 23 27   GLUCOSE 388* 335* 168* 277* 270*  BUN 7 9 10 16 20   CREATININE 0.61 0.42* 0.43* 0.54 0.41*  CALCIUM  8.6* 8.4* 8.6* 8.4* 8.6*  MG  --  2.1 2.2 2.5* 2.4  PHOS  --  2.8 3.2 3.5  --     GFR: Estimated Creatinine Clearance: 94 mL/min (A) (by C-G formula based on SCr of 0.41 mg/dL (L)).  Liver Function Tests: Recent Labs  Lab 12/05/23 0541 12/06/23 0252 12/07/23 0254 12/08/23 0400  AST 22  --   --   --   ALT 18  --   --   --   ALKPHOS 113  --   --   --  BILITOT 1.0  --   --   --   PROT 7.9  --   --   --   ALBUMIN 3.3* 3.0* 2.8* 2.7*    CBG: Recent Labs  Lab 12/08/23 1148 12/08/23 1710 12/08/23 2137 12/09/23 0809 12/09/23 1200  GLUCAP 378* 365* 374* 316* 301*     Recent Results (from the past 240 hours)  Resp panel by RT-PCR (RSV, Flu A&B, Covid) Anterior Nasal Swab     Status: None   Collection Time: 12/04/23 10:57 PM   Specimen: Anterior Nasal Swab  Result Value Ref Range Status   SARS Coronavirus 2 by RT PCR NEGATIVE NEGATIVE Final    Comment: (NOTE) SARS-CoV-2 target nucleic acids are NOT DETECTED.  The SARS-CoV-2 RNA is generally detectable in upper respiratory specimens during the acute phase of infection. The lowest concentration of SARS-CoV-2 viral copies this assay can detect is 138 copies/mL. A negative result does not preclude SARS-Cov-2 infection and should not be used as the sole basis for treatment or other patient management decisions. A negative result may occur with  improper specimen collection/handling, submission of specimen other than nasopharyngeal swab, presence of viral mutation(s) within the areas targeted by this assay, and inadequate number of viral copies(<138 copies/mL). A negative result must be combined with clinical  observations, patient history, and epidemiological information. The expected result is Negative.  Fact Sheet for Patients:  BloggerCourse.com  Fact Sheet for Healthcare Providers:  SeriousBroker.it  This test is no t yet approved or cleared by the [place]  FDA and  has been authorized for detection and/or diagnosis of SARS-CoV-2 by FDA under an Emergency Use Authorization (EUA). This EUA will remain  in effect (meaning this test can be used) for the duration of the COVID-19 declaration under Section 564(b)(1) of the Act, 21 U.S.C.section 360bbb-3(b)(1), unless the authorization is terminated  or revoked sooner.       Influenza A by PCR NEGATIVE NEGATIVE Final   Influenza B by PCR NEGATIVE NEGATIVE Final    Comment: (NOTE) The Xpert Xpress SARS-CoV-2/FLU/RSV plus assay is intended as an aid in the diagnosis of influenza from Nasopharyngeal swab specimens and should not be used as a sole basis for treatment. Nasal washings and aspirates are unacceptable for Xpert Xpress SARS-CoV-2/FLU/RSV testing.  Fact Sheet for Patients: BloggerCourse.com  Fact Sheet for Healthcare Providers: SeriousBroker.it  This test is not yet approved or cleared by the [place]  FDA and has been authorized for detection and/or diagnosis of SARS-CoV-2 by FDA under an Emergency Use Authorization (EUA). This EUA will remain in effect (meaning this test can be used) for the duration of the COVID-19 declaration under Section 564(b)(1) of the Act, 21 U.S.C. section 360bbb-3(b)(1), unless the authorization is terminated or revoked.     Resp Syncytial Virus by PCR NEGATIVE NEGATIVE Final    Comment: (NOTE) Fact Sheet for Patients: BloggerCourse.com  Fact Sheet for Healthcare Providers: SeriousBroker.it  This test is not yet approved or cleared by  the [place]  FDA and has been authorized for detection and/or diagnosis of SARS-CoV-2 by FDA under an Emergency Use Authorization (EUA). This EUA will remain in effect (meaning this test can be used) for the duration of the COVID-19 declaration under Section 564(b)(1) of the Act, 21 U.S.C. section 360bbb-3(b)(1), unless the authorization is terminated or revoked.  Performed at [facility], [address], [place], [place] [address]   MRSA Next Gen by PCR, Nasal     Status: None   Collection  Time: 12/05/23  2:25 AM   Specimen: Nasal Mucosa; Nasal Swab  Result Value Ref Range Status   MRSA by PCR Next Gen NOT DETECTED NOT DETECTED Final    Comment: (NOTE) The GeneXpert MRSA Assay (FDA approved for NASAL specimens only), is one component of a comprehensive MRSA colonization surveillance program. It is not intended to diagnose MRSA infection nor to guide or monitor treatment for MRSA infections. Test performance is not FDA approved in patients less than 40 years old. Performed at [facility], [address] [place], [place] [address]   Culture, blood (Routine X 2) w Reflex to ID Panel     Status: None (Preliminary result)   Collection Time: 12/05/23  5:41 AM   Specimen: BLOOD LEFT ARM  Result Value Ref Range Status   Specimen Description   Final    BLOOD LEFT ARM Performed at [facility], [address], [place], [place] [address]    Special Requests   Final    BOTTLES DRAWN AEROBIC AND ANAEROBIC Blood Culture adequate volume Performed at [facility], [address] [place], [place] [address]    Culture   Final    NO GROWTH 4 DAYS Performed at [facility], [address] [place], [place] [address]    Report Status PENDING  Incomplete  Culture, blood (Routine X 2) w Reflex to ID Panel     Status: None (Preliminary result)   Collection Time: 12/05/23  5:41 AM   Specimen: BLOOD LEFT ARM  Result  Value Ref Range Status   Specimen Description   Final    BLOOD LEFT ARM Performed at [facility], [address] [place], [place] [address]    Special Requests   Final    BOTTLES DRAWN AEROBIC AND ANAEROBIC Blood Culture adequate volume Performed at [facility], [address], [place], [place] [address]    Culture   Final    NO GROWTH 4 DAYS Performed at [facility], [address] [place], [place] [address]    Report Status PENDING  Incomplete  Expectorated Sputum Assessment w Gram Stain, Rflx to Resp Cult     Status: None   Collection Time: 12/05/23  1:29 PM   Specimen: Sputum  Result Value Ref Range Status   Specimen Description SPUTUM  Final   Special Requests NONE  Final   Sputum evaluation   Final    Sputum specimen not acceptable for testing.  Please recollect.   NOTIFIED H. SCHULTZ,RN ON 12/05/2023 AT 1347 BY SL Performed at [facility], [address], [place], [place] [address]    Report Status 12/05/2023 FINAL  Final  Respiratory (~20 pathogens) panel by PCR     Status: Abnormal   Collection Time: 12/05/23  1:29 PM   Specimen: Nasopharyngeal Swab; Respiratory  Result Value Ref Range Status   Adenovirus NOT DETECTED NOT DETECTED Final   Coronavirus 229E NOT DETECTED NOT DETECTED Final    Comment: (NOTE) The Coronavirus on the Respiratory Panel, DOES NOT test for the novel  Coronavirus (2019 nCoV)    Coronavirus HKU1 NOT DETECTED NOT DETECTED Final   Coronavirus NL63 NOT DETECTED NOT DETECTED Final   Coronavirus OC43 NOT DETECTED NOT DETECTED Final   Metapneumovirus NOT DETECTED NOT DETECTED Final   Rhinovirus / Enterovirus NOT DETECTED NOT DETECTED Final   Influenza A NOT DETECTED NOT DETECTED Final   Influenza B NOT DETECTED NOT  DETECTED Final   Parainfluenza Virus 1 NOT DETECTED NOT DETECTED Final   Parainfluenza Virus 2 NOT DETECTED NOT DETECTED Final   Parainfluenza Virus 3 DETECTED (A) NOT DETECTED  Final   Parainfluenza Virus 4 NOT DETECTED NOT DETECTED Final   Respiratory Syncytial Virus NOT DETECTED NOT DETECTED Final   Bordetella pertussis NOT DETECTED NOT DETECTED Final   Bordetella Parapertussis NOT DETECTED NOT DETECTED Final   Chlamydophila pneumoniae NOT DETECTED NOT DETECTED Final   Mycoplasma pneumoniae NOT DETECTED NOT DETECTED Final    Comment: Performed at [facility], [address] [place], [place] [address]         Radiology Studies: DG CHEST PORT 1 VIEW Result Date: 12/08/2023 EXAM: 1 VIEW XRAY OF THE CHEST 12/08/2023 10:06:00 AM COMPARISON: Plain film and CT of yesterday. CLINICAL HISTORY: [id number] Acute respiratory failure with hypoxia (HCC) [id number]. Acute respiratory failure with hypoxia; unable to remove bra due to multiple lines. Acute respiratory failure with hypoxia; unable to remove bra due to multiple lines. FINDINGS: LUNGS AND PLEURA: Lower lung predominant, bilateral interstitial and airspace disease is minimally improved. Primary area of improvement is the upper lobes. No new pulmonary opacity. No pleural effusion. No pneumothorax. HEART AND MEDIASTINUM: No acute abnormality of the cardiac and mediastinal silhouettes. BONES AND SOFT TISSUES: No acute osseous abnormality. Multiple wires and leads project over the chest on the frontal radiograph. IMPRESSION: 1. Lower lung predominant, bilateral interstitial and airspace disease, minimally improved, with primary improvement in the upper lobes. No new pulmonary opacity. Electronically signed by: Rockey Kilts MD 12/08/2023 11:03 AM EDT RP Workstation: [id number]        Scheduled Meds:  amLODipine   10 mg Oral Daily   budesonide  (PULMICORT ) nebulizer solution  0.5 mg Nebulization BID   Chlorhexidine  Gluconate Cloth  6 each Topical Daily   enoxaparin  (LOVENOX ) injection  40 mg Subcutaneous Q24H   famotidine   20 mg Oral BID   fluticasone   2 spray Each Nare Daily   guaiFENesin   1,200 mg Oral BID   insulin   aspart  0-20 Units Subcutaneous TID WC   insulin  aspart  0-5 Units Subcutaneous QHS   insulin  glargine-yfgn  40 Units Subcutaneous Daily   ipratropium-albuterol   3 mL Nebulization Q4H   lamoTRIgine   150 mg Oral Daily   levothyroxine   75 mcg Oral Q0600   loratadine   10 mg Oral Daily   [START ON 12/10/2023] methylPREDNISolone  (SOLU-MEDROL ) injection  40 mg Intravenous Daily   nicotine   21 mg Transdermal Daily   pantoprazole   40 mg Oral Daily   PARoxetine   10 mg Oral QHS   propranolol   20 mg Oral TID   rosuvastatin   5 mg Oral Daily   ziprasidone   60 mg Oral QHS   Continuous Infusions:  azithromycin  Stopped (12/08/23 1511)   dexmedetomidine  (PRECEDEX ) IV infusion 0.2 mcg/kg/hr (12/09/23 1313)      LOS: 4 days    Time spent: 45 minutes    Elvan Sor, MD [facility]   To contact the attending provider between 7A-7P or the covering provider during after hours 7P-7A, please log into the web site [email or web address] and access using universal [place] password for that web site. If you do not have the password, please call the hospital operator.  12/09/2023, 2:44 PM

## 2023-12-09 NOTE — Progress Notes (Signed)
 eLink Physician-Brief Progress Note Patient Name: REGNIA MATHWIG DOB: 28-Oct-1980 MRN: [id number]   Date of Service  12/09/2023  HPI/Events of Note  Patient with anxiety making it difficult to tolerate BIPAP.  eICU Interventions  Low level Precedex  gtt ordered.        Liahm Grivas U Megen Madewell 12/09/2023, 4:18 AM

## 2023-12-09 NOTE — Inpatient Diabetes Management (Signed)
 Inpatient Diabetes Program Recommendations  AACE/ADA: New Consensus Statement on Inpatient Glycemic Control (2015)  Target Ranges:  Prepandial:   less than 140 mg/dL      Peak postprandial:   less than 180 mg/dL (1-2 hours)      Critically ill patients:  140 - 180 mg/dL   Lab Results  Component Value Date   GLUCAP 301 (H) 12/09/2023   HGBA1C 9.6 (H) 12/07/2023    Review of Glycemic Control  Diabetes history: DM2 Outpatient Diabetes medications: Humalog  10 TID, metformin  500 mg BID Current orders for Inpatient glycemic control: Semglee  40 daily, Novolog  0-20 TID with meals and 0-5 HS  On Solumedrol 40 daily  HgbA1C - 9.6%  Inpatient Diabetes Program Recommendations:    Consider adding Novolog  4 units TID with meals if eating > 50%  Spoke with pt at bedside regarding her diabetes control and HgbA1C. Pt states she was drinking a lot of soda at home and was glad to hear her HgbA1C has decreased to 9.6%. Discussed glucose and HgbA1C goals. Discussed steroids causing hyperglycemia and trying to eat healthy while on steroids. Pt ate 100% at lunch today. Would benefit from adding meal coverage insulin  - goal is 140-180 mg/dL for glycemic control in hospital.  Continue to follow trends.   Discussed above with [facility].  Thank you. Shona Brandy, RD, LDN, CDCES Inpatient Diabetes Coordinator [phone]

## 2023-12-09 NOTE — Progress Notes (Signed)
NAME:  Emily [name], MRN:  [id number], DOB:  Sep 11, 1980, LOS: 4 ADMISSION DATE:  12/04/2023, CONSULTATION DATE:  12/05/23 REFERRING MD:  Toribio Hummer, MD CHIEF COMPLAINT:  Pneumonia   History of Present Illness:  Emily [name] is a 43 year old woman, daily smoker with asthma, DMII, hypertension, hypothyroidism, bipolar disorder, cirrhosis and lupus who is admitted for acute hypoxemic respiratory failure due to pneumonia.   She reports 1 week of progressive dyspnea, cough and wheezing. She was treated with Zpak and prednisone  at an Urgent Care recently but her symptoms did not improve after being on therapy for 2 days.   She is followed by Dr. Geronimo in pulmonary clinic for asthma and lung nodules. Her asthma regimen includes advair diskus 250-50mcg 1 puff twice daily, montelukast  10mg  daily and as needed albuterol .   She is smoking 1 pack per day. She has history of positive ANA testing.  Pertinent  Medical History    has a past medical history of Abnormal CT of liver, Anxiety, Asthma, Asthma, Bipolar 1 disorder (HCC), Bipolar disorder (HCC), Chronic pain, Cirrhosis (HCC), Depression, Diabetes mellitus without complication (HCC), Fatty liver, Gallstones, GERD (gastroesophageal reflux disease), Headache(784.0), Hyperlipidemia, Hypertension, Hypothyroidism, Lupus (systemic lupus erythematosus) (HCC), Pulmonary nodule, Tachycardia, Thyroid disease, and Vitamin D  deficiency.   has a past surgical history that includes Dilation and curettage of uterus and Upper gi endoscopy (08/28/2022).    Significant Hospital Events: Including procedures, antibiotic start and stop dates in addition to other pertinent events   8/16 admitted for respiratory failure and pneumonia, Viral panel positive for parainfluenza virus 8/17  - She is tearful this morning and wanting to go home. Mother at bedside. She has been weaned to 8L O2 8/1: HFNC 12L . Afebrile. Pulse ox 88-91%. PAtient and mom at bedside.  She is very fearful of ending up oin ventilator and dying. She is in tears. Refused BiPAP but after counseling agreed to try on. Also feels beathin worse afte rIV steroids changed to po per patient and mom.  Mom reports multiple family members sick  Interim History / Subjective:   8/19 - used BiPAP last night and day. On15L HHFNC. Feeling better. On IV Solumedrol since yesterday. [place] antibiotics. Not interested in doxy. ESR 80s   Objective    Blood pressure 130/84, pulse 75, temperature 98.2 F (36.8 C), temperature source Axillary, resp. rate (!) 30, height 5' 3.5 (1.613 m), weight 82 kg, last menstrual period 10/29/2023, SpO2 96%.    FiO2 (%):  [55 %] 55 % PEEP:  [5 cmH20] 5 cmH20   Intake/Output Summary (Last 24 hours) at 12/09/2023 1131 Last data filed at 12/09/2023 0855 Gross per 24 hour  Intake 285.85 ml  Output --  Net 285.85 ml    Filed Weights   12/05/23 0237  Weight: 82 kg    Examination:  General Appearance: Obese woman in no distress on high flow nasal cannula Head: Oropharynx clear Throat: No stridor, strong voice Lungs: Few scattered end expiratory wheezes, good air movement, bilateral soft inspiratory crackles especially bases Heart: Distant, regular, no murmur Abdomen: Obese, nondistended with positive bowel sounds Extremities: No edema Skin: No rash Neurologic: Awake, somewhat tangential.  Unclear that she has good insight into her medical condition.  Redirectable.  Follows commands with good strength       Resolved problem list   Assessment and Plan  Acute Hypoxemic Respiratory Failure Sepsis due to Viral Pneumonia, Parainfluenza Asthma with Acute Exacerbation Cigarette Smoker DMII Cirrhosis Hx of  Lupus Chronic anxiety with acute anxiety associated with BiPAP, on precedex  -CT chest consistent with viral pneumonia +/- associated acute lung injury -Culture negative, azithromycin  started 8/19  Recommendations: - Okay to complete full  course azithromycin , 5-7 days -diuresis as she can tolerate, dosing daily - Wean high flow nasal cannula as able - Okay to use BiPAP as she can tolerate, unclear if this needs to be mandatory especially if she needs precedex  to tolerate - Corticosteroids need to be weaned, currently high dose solumedrol > change to 40mg  every day and plan transition to prednisone  soon - duoneb and pulmicort  nebs - needs smoking cessation -Will work to wean off Precedex  soon as possible.  She has clonazepam  available to use if needed  Hypertension Allergies/rhinitis GERD Diabetes mellitus Hypothyroidism Hyperlipidemia - As per [facility] Plans  Best Practice (right click and Reselect all SmartList Selections daily)   Diet/type: Regular consistency (see orders) DVT prophylaxis LMWH Pressure ulcer(s): N/A GI prophylaxis: N/A Lines: N/A Foley:  N/A Code Status:  full code Last date of multidisciplinary goals of care discussion [per primary team]   Mom and patient at bedside 8/20   ATTESTATION & SIGNATURE   Critical care time 32 minutes  Lamar Chris, MD, PhD 12/09/2023, 11:50 AM [facility] [phone] or if no answer before 7:00PM call [phone] For any issues after 7:00PM please call eLink [phone]     LABS    PULMONARY Recent Labs  Lab 12/05/23 0818  PHART 7.4  PCO2ART 46  PO2ART 65*  HCO3 28.5*  O2SAT 94.6    CBC Recent Labs  Lab 12/07/23 0254 12/08/23 0400 12/09/23 0332  HGB 13.9 13.1 13.3  HCT 40.8 38.2 40.0  WBC 16.5* 11.3* 11.3*  PLT 179 168 176    COAGULATION Recent Labs  Lab 12/05/23 0541  INR 1.1    CARDIAC  No results for input(s): TROPONINI in the last 168 hours. No results for input(s): PROBNP in the last 168 hours.   CHEMISTRY Recent Labs  Lab 12/05/23 0541 12/06/23 0252 12/07/23 0254 12/08/23 0400 12/09/23 0332  NA 133* 133* 138 135 137  K 3.6 3.5 3.3* 4.1 3.5  CL 98 99 101 101 100  CO2 23 25 26 23  27   GLUCOSE 388* 335* 168* 277* 270*  BUN 7 9 10 16 20   CREATININE 0.61 0.42* 0.43* 0.54 0.41*  CALCIUM  8.6* 8.4* 8.6* 8.4* 8.6*  MG  --  2.1 2.2 2.5* 2.4  PHOS  --  2.8 3.2 3.5  --    Estimated Creatinine Clearance: 94 mL/min (A) (by C-G formula based on SCr of 0.41 mg/dL (L)).   LIVER Recent Labs  Lab 12/05/23 0541 12/06/23 0252 12/07/23 0254 12/08/23 0400  AST 22  --   --   --   ALT 18  --   --   --   ALKPHOS 113  --   --   --   BILITOT 1.0  --   --   --   PROT 7.9  --   --   --   ALBUMIN 3.3* 3.0* 2.8* 2.7*  INR 1.1  --   --   --      INFECTIOUS Recent Labs  Lab 12/05/23 0541 12/05/23 1145 12/08/23 0400  LATICACIDVEN 1.6  --   --   PROCALCITON  --  <0.10 0.15     ENDOCRINE CBG (last 3)  Recent Labs    12/08/23 1710 12/08/23 2137 12/09/23 0809  GLUCAP 365* 374*  316*     

## 2023-12-10 ENCOUNTER — Inpatient Hospital Stay (HOSPITAL_COMMUNITY)

## 2023-12-10 DIAGNOSIS — E119 Type 2 diabetes mellitus without complications: Secondary | ICD-10-CM | POA: Diagnosis not present

## 2023-12-10 DIAGNOSIS — E039 Hypothyroidism, unspecified: Secondary | ICD-10-CM | POA: Diagnosis not present

## 2023-12-10 DIAGNOSIS — J9601 Acute respiratory failure with hypoxia: Secondary | ICD-10-CM | POA: Diagnosis not present

## 2023-12-10 DIAGNOSIS — J129 Viral pneumonia, unspecified: Secondary | ICD-10-CM

## 2023-12-10 DIAGNOSIS — I10 Essential (primary) hypertension: Secondary | ICD-10-CM | POA: Diagnosis not present

## 2023-12-10 DIAGNOSIS — J9690 Respiratory failure, unspecified, unspecified whether with hypoxia or hypercapnia: Secondary | ICD-10-CM | POA: Diagnosis not present

## 2023-12-10 DIAGNOSIS — E785 Hyperlipidemia, unspecified: Secondary | ICD-10-CM | POA: Diagnosis not present

## 2023-12-10 DIAGNOSIS — J984 Other disorders of lung: Secondary | ICD-10-CM | POA: Diagnosis not present

## 2023-12-10 DIAGNOSIS — R6520 Severe sepsis without septic shock: Secondary | ICD-10-CM | POA: Diagnosis not present

## 2023-12-10 DIAGNOSIS — A419 Sepsis, unspecified organism: Secondary | ICD-10-CM | POA: Diagnosis not present

## 2023-12-10 DIAGNOSIS — F419 Anxiety disorder, unspecified: Secondary | ICD-10-CM | POA: Diagnosis not present

## 2023-12-10 DIAGNOSIS — K219 Gastro-esophageal reflux disease without esophagitis: Secondary | ICD-10-CM | POA: Diagnosis not present

## 2023-12-10 LAB — BASIC METABOLIC PANEL WITH GFR
Anion gap: 11 (ref 5–15)
Anion gap: 12 (ref 5–15)
BUN: 17 mg/dL (ref 6–20)
BUN: 16 mg/dL (ref 6–20)
CO2: 28 mmol/L (ref 22–32)
CO2: 27 mmol/L (ref 22–32)
Calcium: 8.8 mg/dL — ABNORMAL LOW (ref 8.9–10.3)
Calcium: 8.4 mg/dL — ABNORMAL LOW (ref 8.9–10.3)
Chloride: 98 mmol/L (ref 98–111)
Chloride: 93 mmol/L — ABNORMAL LOW (ref 98–111)
Creatinine, Ser: 0.52 mg/dL (ref 0.44–1.00)
Creatinine, Ser: 0.75 mg/dL (ref 0.44–1.00)
GFR, Estimated: >60 mL/min (ref >60)
GFR, Estimated: >60 mL/min (ref >60)
Glucose, Bld: 238 mg/dL — ABNORMAL HIGH (ref 70–99)
Glucose, Bld: 359 mg/dL — ABNORMAL HIGH (ref 70–99)
Potassium: 3.7 mmol/L (ref 3.5–5.1)
Potassium: 3.5 mmol/L (ref 3.5–5.1)
Sodium: 137 mmol/L (ref 135–145)
Sodium: 132 mmol/L — ABNORMAL LOW (ref 135–145)

## 2023-12-10 LAB — CBC
HCT: 38.8 % (ref 36.0–46.0)
Hemoglobin: 13.2 g/dL (ref 12.0–15.0)
MCH: 35.1 pg — ABNORMAL HIGH (ref 26.0–34.0)
MCHC: 34 g/dL (ref 30.0–36.0)
MCV: 103.2 fL — ABNORMAL HIGH (ref 80.0–100.0)
Platelets: 160 K/uL (ref 150–400)
RBC: 3.76 MIL/uL — ABNORMAL LOW (ref 3.87–5.11)
RDW: 15.3 % (ref 11.5–15.5)
WBC: 13.2 K/uL — ABNORMAL HIGH (ref 4.0–10.5)
nRBC: 0 % (ref 0.0–0.2)

## 2023-12-10 LAB — GLUCOSE, CAPILLARY
Glucose-Capillary: 229 mg/dL — ABNORMAL HIGH (ref 70–99)
Glucose-Capillary: 269 mg/dL — ABNORMAL HIGH (ref 70–99)
Glucose-Capillary: 244 mg/dL — ABNORMAL HIGH (ref 70–99)
Glucose-Capillary: 403 mg/dL — ABNORMAL HIGH (ref 70–99)

## 2023-12-10 LAB — CULTURE, BLOOD (ROUTINE X 2)
Culture: NO GROWTH
Culture: NO GROWTH
Special Requests: ADEQUATE
Special Requests: ADEQUATE

## 2023-12-10 LAB — PHOSPHORUS: Phosphorus: 3.9 mg/dL (ref 2.5–4.6)

## 2023-12-10 LAB — MAGNESIUM: Magnesium: 2.3 mg/dL (ref 1.7–2.4)

## 2023-12-10 MED ORDER — ORAL CARE MOUTH RINSE: 15.0000 mL | OROMUCOSAL | Status: DC | PRN

## 2023-12-10 MED ORDER — POTASSIUM CHLORIDE 20 MEQ PO PACK
20.0000 meq | PACK | Freq: Once | ORAL | Status: AC
  Administered 2023-12-10: 20 meq via ORAL
  Filled 2023-12-10: qty 1

## 2023-12-10 MED ORDER — ORAL CARE MOUTH RINSE
15.0000 mL | OROMUCOSAL | Status: DC
  Administered 2023-12-10 – 2023-12-13 (×7): 15 mL via OROMUCOSAL

## 2023-12-10 MED ORDER — INSULIN ASPART 100 UNIT/ML IJ SOLN
6.0000 [IU] | Freq: Once | INTRAMUSCULAR | Status: AC
  Administered 2023-12-10: 6 [IU] via SUBCUTANEOUS

## 2023-12-10 MED ORDER — FUROSEMIDE 10 MG/ML IJ SOLN
40.0000 mg | Freq: Once | INTRAMUSCULAR | Status: AC
  Administered 2023-12-10: 40 mg via INTRAVENOUS
  Filled 2023-12-10: qty 4

## 2023-12-10 MED ORDER — CLONAZEPAM 0.5 MG PO TABS
0.5000 mg | ORAL_TABLET | Freq: Three times a day (TID) | ORAL | Status: DC | PRN
  Administered 2023-12-10 – 2023-12-12 (×5): 0.5 mg via ORAL
  Filled 2023-12-10 (×5): qty 1

## 2023-12-10 NOTE — Inpatient Diabetes Management (Signed)
 Inpatient Diabetes Program Recommendations  AACE/ADA: New Consensus Statement on Inpatient Glycemic Control (2015)  Target Ranges:  Prepandial:   less than 140 mg/dL      Peak postprandial:   less than 180 mg/dL (1-2 hours)      Critically ill patients:  140 - 180 mg/dL   Lab Results  Component Value Date   GLUCAP 269 (H) 12/10/2023   HGBA1C 9.6 (H) 12/07/2023    Review of Glycemic Control  Diabetes history: DM2 Outpatient Diabetes medications: Humalog  10 TID, metformin  500 mg BID Current orders for Inpatient glycemic control: Semglee  40 daily, Novolog  0-20 TID with meals and 0-5 HS + 4 units TID  On Solumedrol 40 mg daily Hgba1C - 9.6%  Inpatient Diabetes Program Recommendations:    Consider increasing Semglee  to 42-44 units daily  Will continue to follow glucose trends.   Thank you. Shona Brandy, RD, LDN, CDCES Inpatient Diabetes Coordinator [phone]

## 2023-12-10 NOTE — Progress Notes (Signed)
 Patient concerned about oxygen burning her nose. She is currently on 13 LPM . Her SPO2 is 93% . Discussed with patient different options and patient decided to stay with the medium high flow cannula. RT will revisit once patient is not requiring 13 LPM of oxygen. Patient says she is doing the flutter valve routinely.

## 2023-12-10 NOTE — Progress Notes (Signed)
 NAME:  Emily [name], MRN:  [id number], DOB:  [birth year], LOS: 5 ADMISSION DATE:  12/04/2023, CONSULTATION DATE:  12/05/23 REFERRING MD:  Emily Hummer, MD CHIEF COMPLAINT:  Pneumonia   History of Present Illness:  Emily [name] is a 43 year old woman, daily smoker with asthma, DMII, hypertension, hypothyroidism, bipolar disorder, cirrhosis and lupus who is admitted for acute hypoxemic respiratory failure due to pneumonia.   She reports 1 week of progressive dyspnea, cough and wheezing. She was treated with Zpak and prednisone  at an Urgent Care recently but her symptoms did not improve after being on therapy for 2 days.   She is followed by Dr. Geronimo in pulmonary clinic for asthma and lung nodules. Her asthma regimen includes advair diskus 250-50mcg 1 puff twice daily, montelukast  10mg  daily and as needed albuterol .   She is smoking 1 pack per day. She has history of positive ANA testing.  Pertinent  Medical History    has a past medical history of Abnormal CT of liver, Anxiety, Asthma, Asthma, Bipolar 1 disorder (HCC), Bipolar disorder (HCC), Chronic pain, Cirrhosis (HCC), Depression, Diabetes mellitus without complication (HCC), Fatty liver, Gallstones, GERD (gastroesophageal reflux disease), Headache(784.0), Hyperlipidemia, Hypertension, Hypothyroidism, Lupus (systemic lupus erythematosus) (HCC), Pulmonary nodule, Tachycardia, Thyroid disease, and Vitamin D  deficiency.   has a past surgical history that includes Dilation and curettage of uterus and Upper gi endoscopy (08/28/2022).    Significant Hospital Events: Including procedures, antibiotic start and stop dates in addition to other pertinent events   8/16 admitted for respiratory failure and pneumonia, Viral panel positive for parainfluenza virus 8/17  - She is tearful this morning and wanting to go home. Mother at bedside. She has been weaned to 8L O2 8/1: HFNC 12L . Afebrile. Pulse ox 88-91%. PAtient and mom at bedside.  She is very fearful of ending up oin ventilator and dying. She is in tears. Refused BiPAP but after counseling agreed to try on. Also feels beathin worse afte rIV steroids changed to po per patient and mom.  Mom reports multiple family members sick 8/18-8/20 solumedrol 125mg  TID, ESR 84 8/20 used BiPAP last night and day. On15L HHFNC. Feeling better. On IV Solumedrol since yesterday. Wanting antibiotics. Not interested in doxy. Off precedex  (needed for BiPAP  Interim History / Subjective:  Using BiPAP overnight.  Denies any SOB, overall continues to feel better.  Still with subjective intermittent yellowish sputum.  Currently on salter HFNC 14L but asking to switch to face mask due to nasal irritation and she thinks is more of a mouth breather  Objective    Blood pressure (!) 152/95, pulse 80, temperature (!) 97.5 F (36.4 C), temperature source Oral, resp. rate (!) 25, height 5' 3.5 (1.613 m), weight 82 kg, last menstrual period 10/29/2023, SpO2 94%.    FiO2 (%):  [55 %] 55 % PEEP:  [5 cmH20] 5 cmH20   Intake/Output Summary (Last 24 hours) at 12/10/2023 0940 Last data filed at 12/10/2023 9175 Gross per 24 hour  Intake 531.7 ml  Output --  Net 531.7 ml    Filed Weights   12/05/23 0237  Weight: 82 kg    Examination: General:  Middle age female lying in bed in NAD, emotional and tearful at times HEENT: MM pink/moist, poor dentition Neuro: Alert and appropriate, MAE CV: rr, SR 70-80s PULM:  non labored, no conversational dyspnea, scattered rales, R> L insp/ exp wheeze, salter HFNC 14>13L currently 94%, not seen below 93% while on my exam, has not been  using IS, using flutter GI: soft, bs+, voids  Extremities: warm/dry, no pitting edema  Skin: no rashes  Afebrile WBC 16> 11> 13 CXR reviewed> interval progression of upper airway disease bilaterally Day 3x of azithro   Resolved problem list   Assessment and Plan  Acute Hypoxemic Respiratory Failure Sepsis due to Viral  Pneumonia, Parainfluenza Asthma with Acute Exacerbation Cigarette Smoker DMII Cirrhosis Hx of Lupus Chronic anxiety with acute anxiety associated with BiPAP -CT chest consistent with viral pneumonia +/- associated acute lung injury -Culture negative, azithromycin  started 8/19 - off precedex  8/20 Recommendations: - cont to wean O2 for sat goal of >92%, prn BiPAP at night - repeat lasix  today.  Repeat CXR in am.  Follow weights - suspect this [name] be slow recovery  - encourage IS, flutter, OOB as felt better up in chair but mostly staying in bed/ [facility] - complete 5-7 azithro - cont solumedrol 40mg  daily started 8/20 after completion of 3 days of high dose steroids.  [name] need very slow taper - cont duonebs/ pulmicort  - increase clonzazepam to 0.5mg  TID PRN (on 1mg  BID) to help with anxiety/ stay off precedex  - ensure good sleep hygiene - ongoing smoking cessation- pt wants to quit - cont H2B   Hypertension Allergies/rhinitis GERD Diabetes mellitus Hypothyroidism Hyperlipidemia - As per [facility] Plans  Best Practice (right click and Reselect all SmartList Selections daily)   Diet/type: Regular consistency (see orders) DVT prophylaxis LMWH Pressure ulcer(s): N/A GI prophylaxis: N/A Lines: N/A Foley:  N/A Code Status:  full code Last date of multidisciplinary goals of care discussion [per primary team]  Mother at bedside, updated on plan of care 8/21  LABS    PULMONARY Recent Labs  Lab 12/05/23 0818  PHART 7.4  PCO2ART 46  PO2ART 65*  HCO3 28.5*  O2SAT 94.6    CBC Recent Labs  Lab 12/08/23 0400 12/09/23 0332 12/10/23 0349  HGB 13.1 13.3 13.2  HCT 38.2 40.0 38.8  WBC 11.3* 11.3* 13.2*  PLT 168 176 160    COAGULATION Recent Labs  Lab 12/05/23 0541  INR 1.1    CARDIAC  No results for input(s): TROPONINI in the last 168 hours. No results for input(s): PROBNP in the last 168 hours.   CHEMISTRY Recent Labs  Lab 12/06/23 0252 12/07/23 0254  12/08/23 0400 12/09/23 0332 12/10/23 0349  NA 133* 138 135 137 137  K 3.5 3.3* 4.1 3.5 3.7  CL 99 101 101 100 98  CO2 25 26 23 27 28   GLUCOSE 335* 168* 277* 270* 238*  BUN 9 10 16 20 17   CREATININE 0.42* 0.43* 0.54 0.41* 0.52  CALCIUM  8.4* 8.6* 8.4* 8.6* 8.8*  MG 2.1 2.2 2.5* 2.4 2.3  PHOS 2.8 3.2 3.5  --  3.9   Estimated Creatinine Clearance: 94 mL/min (by C-G formula based on SCr of 0.52 mg/dL).   LIVER Recent Labs  Lab 12/05/23 0541 12/06/23 0252 12/07/23 0254 12/08/23 0400  AST 22  --   --   --   ALT 18  --   --   --   ALKPHOS 113  --   --   --   BILITOT 1.0  --   --   --   PROT 7.9  --   --   --   ALBUMIN 3.3* 3.0* 2.8* 2.7*  INR 1.1  --   --   --      INFECTIOUS Recent Labs  Lab 12/05/23 0541 12/05/23 1145 12/08/23 0400  LATICACIDVEN  1.6  --   --   PROCALCITON  --  <0.10 0.15     ENDOCRINE CBG (last 3)  Recent Labs    12/09/23 1722 12/09/23 2142 12/10/23 0731  GLUCAP 356* 295* 229*    CCT n/a  Lyle Pesa, MSN, AG-ACNP-BC [facility] 12/10/2023, 9:40 AM  See Amion for pager If no response to pager , please call [phone] until 7pm After 7:00 pm call Elink  [phone]

## 2023-12-10 NOTE — Plan of Care (Signed)
  Problem: Clinical Measurements: Goal: Respiratory complications will improve Outcome: Progressing   Problem: Activity: Goal: Risk for activity intolerance will decrease Outcome: Progressing   Problem: Nutrition: Goal: Adequate nutrition will be maintained Outcome: Progressing   Problem: Pain Managment: Goal: General experience of comfort will improve and/or be controlled Outcome: Progressing   Problem: Safety: Goal: Ability to remain free from injury will improve Outcome: Progressing   Problem: Skin Integrity: Goal: Risk for impaired skin integrity will decrease Outcome: Progressing   Problem: Activity: Goal: Ability to tolerate increased activity will improve Outcome: Progressing

## 2023-12-10 NOTE — Progress Notes (Signed)
 PROGRESS NOTE    Emily [name]  FMW:[id number] DOB: 1981-01-23 DOA: 12/04/2023 PCP: [name], NP    Chief Complaint  Patient presents with   Chest Pain   Shortness of Breath    Brief Narrative:  Patient 43 year old female with history of asthma, chronic bronchitis and rhinitis, ongoing tobacco abuse, type 2 diabetes, hypertension, hyperlipidemia, hypothyroidism, lupus, cirrhosis, bipolar disorder, depression and anxiety, GERD who presented with complaints of worsening shortness of breath.  Chest x-ray done concerning for left lobe pneumonia.  Patient noted to be in hypoxic respiratory distress requiring 15 L high flow nasal cannula.  Patient placed on empiric IV antibiotics.  PCCM consulted.  Assessment & Plan:   Principal Problem:   Severe sepsis (HCC) Active Problems:   Acute respiratory failure with hypoxia (HCC)   Infection due to parainfluenza virus 3   TOBACCO ABUSE   Acute asthma exacerbation   G E REFLUX   Essential hypertension   Hypothyroidism   Type 2 diabetes mellitus (HCC)   Gastroesophageal reflux disease   Chronic bronchitis (HCC)   Generalized anxiety disorder   Major depressive disorder, recurrent severe without psychotic features (HCC)  #1 severe sepsis secondary to parainfluenza virus 3 pneumonia -Patient on admission met criteria for severe sepsis with tachypnea, leukocytosis, chest x-ray with patchy left mid and lower lung opacities concerning for pneumonia. - Blood cultures pending. - MRSA PCR negative. - SARS coronavirus 2 PCR negative, influenza A and B by PCR negative, RSV by PCR negative. -Respiratory viral panel positive for parainfluenza virus 3 -ABG with a pH of 7.4, pCO2 of 46, pO2 of 65, bicarb of 28.5. -D-dimer negative. - Sputum Gram stain and cultures pending. - Patient states some clinical improvement. - Patient still requiring high O2 requirements and currently on the BiPAP.  - Procalcitonin noted to be negative.   -  Patient seen by PCCM and felt patient's symptoms secondary to a viral pneumonia and as such antibiotics have been discontinued.  - Continue Pulmicort  twice daily, Brovana  twice daily, Flonase , scheduled DuoNebs, Claritin , PPI, Mucinex , IV Solu-Medro. -Continue BiPAP. -Patient with worsening hypoxia and as such CT chest ordered per PCCM was done which showed diffuse ground glass opacities throughout bilateral lungs with superimposed interlobular interlobular septal thickening compatible with crazy paving pattern.  Also associated superimposed areas of solid lung opacification bilaterally more in the dependent portions.  Nonspecific differential diagnosis includes ARDS, multilobar pneumonia, pulmonary alveolar proteinosis, pulmonary edema,.  -s/p Lasix  40 mg IV x 1 - Due to increased high O2 requirements patient seen in consultation by PCCM who are following and appreciate input and recommendations.  8/21 s/p Lasix  40 mg x 1 dose given by pulm  2.  Acute exacerbation of asthma/chronic bronchitis versus acute COPD exacerbation -Likely exacerbated by parainfluenza virus 3 infection/pneumonia. - Patient noted to have wheezing and rhonchi on examination on presentation with some clinical improvement with increased O2 requirements. - Was slowly clinically improving however patient now with increased O2 requirements currently on 13 L high flow nasal cannula. - Was on IV Solu-Medrol  and has been transitioned to a prednisone  taper per PCCM.   -Due to increasing O2 requirements patient placed back on IV Solu-Medrol  per PCCM.] - CT chest done per PCCM that showed diffuse ground glass opacities throughout bilateral lungs with superimposed interlobular interlobular septal thickening compatible with crazy paving pattern.  Also associated superimposed areas of solid lung opacification bilaterally more in the dependent portions.  Nonspecific differential diagnosis includes ARDS, multilobar pneumonia,  pulmonary alveolar  proteinosis, pulmonary edema,.  -Will give a dose of Lasix  40 mg IV x 1. - Continue scheduled DuoNebs, Pulmicort , Brovana , Mucinex , Claritin , Flonase , PPI.   -Patient on BiPAP. - PCCM following and appreciate input and recommendations.   3.  Acute hypoxic respiratory failure -Likely secondary to problems #1 and 2 versus developing ALI. See #1 and 2. -Patient with increasing O2 requirements currently on the BiPAP.  -Respiratory viral panel positive for parainfluenza virus 3. - May need to consider CT chest due to worsening hypoxia however will defer to PCCM/pulmonary.  -Patient placed back on IV Solu-Medrol , BiPAP and CT chest done per PCCM that showed diffuse ground glass opacities throughout bilateral lungs with superimposed interlobular interlobular septal thickening compatible with crazy paving pattern.  Also associated superimposed areas of solid lung opacification bilaterally more in the dependent portions.  Nonspecific differential diagnosis includes ARDS, multilobar pneumonia, pulmonary alveolar proteinosis, pulmonary edema,.  -Will give a dose of Lasix  40 mg IV x 1. -Patient seen by PCCM who are following and appreciate their input and recommendations.  4.  Mild hyponatremia --Felt likely secondary to poor oral intake in the setting of acute illness. -Improved with hydration.  5.  Tobacco abuse -Tobacco cessation. - Nicotine  patch.  6.  Poorly controlled diabetes mellitus type 2 -Initial glucose of 474 on presentation in the setting of steroid use. - Hemoglobin A1c noted at 10.9 (05/20/2023). - CBG noted at 329 this morning. - Repeat hemoglobin A1c 9.6.  -Increase Semglee  to 40 units daily. -Patient currently on BiPAP and as such meal cover NovoLog  has been discontinued. -Continue resistance scale SSI. -Diabetes coordinator following.  7.  Cirrhosis -Patient with no signs of ascites or hepatic encephalopathy at this time. - LFTs within normal limits. - Continue  propranolol .   8.  Hypothyroidism - Continue Synthroid .  9.  Hyperlipidemia - Continue Crestor .  10.  Bipolar disorder/anxiety/depression - Continue clonazepam  twice daily as needed.   11.  Hypertension - Increase Norvasc  to 10 mg daily.  - Continue propranolol .  12.  GERD - Continue PPI.    13.  SLE - Outpatient follow-up.  14.  Hypokalemia - Repleted.   - Potassium of 4.1.   # Macrocytosis possible secondary to B12 and folate deficiency. MCV 101.8 # Folic acid  level 6.0, at lower end, started folic acid  1 mg p.o. daily.  Follow with PCP repeat folic acid  level after 3 to 6 months. # Vitamin B12 level 252, goal >400.  Started vitamin B12 1000 mcg IM injection daily during hospital stay followed by oral supplement.  Follow with PCP to repeat vitamin B12 level after 3 to 6 months.     DVT prophylaxis: Lovenox  Code Status: Full Family Communication: Updated her mother and father at bedside Disposition: Likely home when clinically improved and cleared by pulmonary/PCCM.  Status is: Inpatient Remains inpatient appropriate because: Severity of illness   Consultants:  PCCM: Dr Kara 12/05/2023  Procedures:  Chest x-ray 12/04/2023, 12/06/2023 CT chest 12/07/2023  Antimicrobials:  Anti-infectives (From admission, onward)    Start     Dose/Rate Route Frequency Ordered Stop   12/08/23 1400  azithromycin  (ZITHROMAX ) 500 mg in sodium chloride  0.9 % 250 mL IVPB        500 mg 250 mL/hr over 60 Minutes Intravenous Every 24 hours 12/08/23 1129     12/05/23 0100  cefTRIAXone  (ROCEPHIN ) 2 g in sodium chloride  0.9 % 100 mL IVPB  Status:  Discontinued  2 g 200 mL/hr over 30 Minutes Intravenous Every 24 hours 12/05/23 0040 12/05/23 1647   12/05/23 0045  cefTRIAXone  (ROCEPHIN ) 1 g in sodium chloride  0.9 % 100 mL IVPB  Status:  Discontinued        1 g 200 mL/hr over 30 Minutes Intravenous Every 24 hours 12/05/23 0034 12/05/23 0039   12/05/23 0045  azithromycin  (ZITHROMAX ) 500  mg in sodium chloride  0.9 % 250 mL IVPB  Status:  Discontinued        500 mg 250 mL/hr over 60 Minutes Intravenous Every 24 hours 12/05/23 0034 12/05/23 1647         Subjective: Patient was seen and examined at bedside today. Patient was laying comfortably in the bed.  Overall she feels little bit improvement, denied any active issues, no complaints.   Objective: Vitals:   12/10/23 1100 12/10/23 1155 12/10/23 1200 12/10/23 1300  BP: (!) 154/91  (!) 144/90 (!) 137/94  Pulse: 86  78 83  Resp: (!) 21  (!) 25 20  Temp:   98.6 F (37 C)   TempSrc:   Oral   SpO2: 93% 91% 98% 94%  Weight:      Height:        Intake/Output Summary (Last 24 hours) at 12/10/2023 1347 Last data filed at 12/10/2023 1159 Gross per 24 hour  Intake 371.66 ml  Output 1300 ml  Net -928.34 ml   Filed Weights   12/05/23 0237 12/10/23 1000  Weight: 82 kg 79.8 kg    Examination:  General exam: Appears calm and comfortable.  Poor dentition.  On BiPAP. Respiratory system: Equal air entry bilaterally, bilateral crackles and mild wheezes. Cardiovascular system: RRR no murmurs rubs or gallops.  No JVD.  No pitting lower extremity edema. Gastrointestinal system: Abdomen is soft, nontender, nondistended, positive bowel sounds.  No rebound.  No guarding.  Central nervous system: Alert and oriented. No focal neurological deficits. Extremities: Symmetric 5 x 5 power. Skin: No rashes, lesions or ulcers Psychiatry: Judgement and insight appear normal. Mood & affect appropriate.     Data Reviewed: I have personally reviewed following labs and imaging studies  CBC: Recent Labs  Lab 12/06/23 0252 12/07/23 0254 12/08/23 0400 12/09/23 0332 12/10/23 0349  WBC 14.8* 16.5* 11.3* 11.3* 13.2*  NEUTROABS 11.6* 10.0* 8.6* 8.7*  --   HGB 13.6 13.9 13.1 13.3 13.2  HCT 41.1 40.8 38.2 40.0 38.8  MCV 101.0* 101.2* 100.5* 101.8* 103.2*  PLT 165 179 168 176 160    Basic Metabolic Panel: Recent Labs  Lab  12/06/23 0252 12/07/23 0254 12/08/23 0400 12/09/23 0332 12/10/23 0349  NA 133* 138 135 137 137  K 3.5 3.3* 4.1 3.5 3.7  CL 99 101 101 100 98  CO2 25 26 23 27 28   GLUCOSE 335* 168* 277* 270* 238*  BUN 9 10 16 20 17   CREATININE 0.42* 0.43* 0.54 0.41* 0.52  CALCIUM  8.4* 8.6* 8.4* 8.6* 8.8*  MG 2.1 2.2 2.5* 2.4 2.3  PHOS 2.8 3.2 3.5  --  3.9    GFR: Estimated Creatinine Clearance: 92.7 mL/min (by C-G formula based on SCr of 0.52 mg/dL).  Liver Function Tests: Recent Labs  Lab 12/05/23 0541 12/06/23 0252 12/07/23 0254 12/08/23 0400  AST 22  --   --   --   ALT 18  --   --   --   ALKPHOS 113  --   --   --   BILITOT 1.0  --   --   --  PROT 7.9  --   --   --   ALBUMIN 3.3* 3.0* 2.8* 2.7*    CBG: Recent Labs  Lab 12/09/23 1200 12/09/23 1722 12/09/23 2142 12/10/23 0731 12/10/23 1214  GLUCAP 301* 356* 295* 229* 269*     Recent Results (from the past 240 hours)  Resp panel by RT-PCR (RSV, Flu A&B, Covid) Anterior Nasal Swab     Status: None   Collection Time: 12/04/23 10:57 PM   Specimen: Anterior Nasal Swab  Result Value Ref Range Status   SARS Coronavirus 2 by RT PCR NEGATIVE NEGATIVE Final    Comment: (NOTE) SARS-CoV-2 target nucleic acids are NOT DETECTED.  The SARS-CoV-2 RNA is generally detectable in upper respiratory specimens during the acute phase of infection. The lowest concentration of SARS-CoV-2 viral copies this assay can detect is 138 copies/mL. A negative result does not preclude SARS-Cov-2 infection and should not be used as the sole basis for treatment or other patient management decisions. A negative result may occur with  improper specimen collection/handling, submission of specimen other than nasopharyngeal swab, presence of viral mutation(s) within the areas targeted by this assay, and inadequate number of viral copies(<138 copies/mL). A negative result must be combined with clinical observations, patient history, and  epidemiological information. The expected result is Negative.  Fact Sheet for Patients:  BloggerCourse.com  Fact Sheet for Healthcare Providers:  SeriousBroker.it  This test is no t yet approved or cleared by the [place]  FDA and  has been authorized for detection and/or diagnosis of SARS-CoV-2 by FDA under an Emergency Use Authorization (EUA). This EUA will remain  in effect (meaning this test can be used) for the duration of the COVID-19 declaration under Section 564(b)(1) of the Act, 21 U.S.C.section 360bbb-3(b)(1), unless the authorization is terminated  or revoked sooner.       Influenza A by PCR NEGATIVE NEGATIVE Final   Influenza B by PCR NEGATIVE NEGATIVE Final    Comment: (NOTE) The Xpert Xpress SARS-CoV-2/FLU/RSV plus assay is intended as an aid in the diagnosis of influenza from Nasopharyngeal swab specimens and should not be used as a sole basis for treatment. Nasal washings and aspirates are unacceptable for Xpert Xpress SARS-CoV-2/FLU/RSV testing.  Fact Sheet for Patients: BloggerCourse.com  Fact Sheet for Healthcare Providers: SeriousBroker.it  This test is not yet approved or cleared by the [place]  FDA and has been authorized for detection and/or diagnosis of SARS-CoV-2 by FDA under an Emergency Use Authorization (EUA). This EUA will remain in effect (meaning this test can be used) for the duration of the COVID-19 declaration under Section 564(b)(1) of the Act, 21 U.S.C. section 360bbb-3(b)(1), unless the authorization is terminated or revoked.     Resp Syncytial Virus by PCR NEGATIVE NEGATIVE Final    Comment: (NOTE) Fact Sheet for Patients: BloggerCourse.com  Fact Sheet for Healthcare Providers: SeriousBroker.it  This test is not yet approved or cleared by the [place]  FDA and has been  authorized for detection and/or diagnosis of SARS-CoV-2 by FDA under an Emergency Use Authorization (EUA). This EUA will remain in effect (meaning this test can be used) for the duration of the COVID-19 declaration under Section 564(b)(1) of the Act, 21 U.S.C. section 360bbb-3(b)(1), unless the authorization is terminated or revoked.  Performed at [facility], [address], [place], [place] [address]   MRSA Next Gen by PCR, Nasal     Status: None   Collection Time: 12/05/23  2:25 AM   Specimen: Nasal Mucosa; Nasal Swab  Result Value Ref Range Status   MRSA by PCR Next Gen NOT DETECTED NOT DETECTED Final    Comment: (NOTE) The GeneXpert MRSA Assay (FDA approved for NASAL specimens only), is one component of a comprehensive MRSA colonization surveillance program. It is not intended to diagnose MRSA infection nor to guide or monitor treatment for MRSA infections. Test performance is not FDA approved in patients less than 23 years old. Performed at [facility], [address], [place], [place] [address]   Culture, blood (Routine X 2) w Reflex to ID Panel     Status: None   Collection Time: 12/05/23  5:41 AM   Specimen: BLOOD LEFT ARM  Result Value Ref Range Status   Specimen Description   Final    BLOOD LEFT ARM Performed at [facility], [address] [place], [place] [address]    Special Requests   Final    BOTTLES DRAWN AEROBIC AND ANAEROBIC Blood Culture adequate volume Performed at [facility], [address], [place], [place] [address]    Culture   Final    NO GROWTH 5 DAYS Performed at [facility], [address] [place], [place] [address]    Report Status 12/10/2023 FINAL  Final  Culture, blood (Routine X 2) w Reflex to ID Panel     Status: None   Collection Time: 12/05/23  5:41 AM   Specimen: BLOOD LEFT ARM  Result Value Ref Range Status   Specimen Description   Final    BLOOD LEFT  ARM Performed at [facility], [address] [place], [place] [address]    Special Requests   Final    BOTTLES DRAWN AEROBIC AND ANAEROBIC Blood Culture adequate volume Performed at [facility], [address], [place], [place] [address]    Culture   Final    NO GROWTH 5 DAYS Performed at [facility], [address], [place], [place] [address]    Report Status 12/10/2023 FINAL  Final  Expectorated Sputum Assessment w Gram Stain, Rflx to Resp Cult     Status: None   Collection Time: 12/05/23  1:29 PM   Specimen: Sputum  Result Value Ref Range Status   Specimen Description SPUTUM  Final   Special Requests NONE  Final   Sputum evaluation   Final    Sputum specimen not acceptable for testing.  Please recollect.   NOTIFIED H. SCHULTZ,RN ON 12/05/2023 AT 1347 BY SL Performed at [facility], [address] [place], [place] [address]    Report Status 12/05/2023 FINAL  Final  Respiratory (~20 pathogens) panel by PCR     Status: Abnormal   Collection Time: 12/05/23  1:29 PM   Specimen: Nasopharyngeal Swab; Respiratory  Result Value Ref Range Status   Adenovirus NOT DETECTED NOT DETECTED Final   Coronavirus 229E NOT DETECTED NOT DETECTED Final    Comment: (NOTE) The Coronavirus on the Respiratory Panel, DOES NOT test for the novel  Coronavirus (2019 nCoV)    Coronavirus HKU1 NOT DETECTED NOT DETECTED Final   Coronavirus NL63 NOT DETECTED NOT DETECTED Final   Coronavirus OC43 NOT DETECTED NOT DETECTED Final   Metapneumovirus NOT DETECTED NOT DETECTED Final   Rhinovirus / Enterovirus NOT DETECTED NOT DETECTED Final   Influenza A NOT DETECTED NOT DETECTED Final   Influenza B NOT DETECTED NOT DETECTED Final   Parainfluenza Virus 1 NOT DETECTED NOT DETECTED Final   Parainfluenza  Virus 2 NOT DETECTED NOT DETECTED Final   Parainfluenza Virus 3 DETECTED (A) NOT DETECTED Final   Parainfluenza Virus 4 NOT DETECTED NOT DETECTED Final    Respiratory Syncytial Virus NOT DETECTED NOT DETECTED Final   Bordetella pertussis NOT DETECTED NOT DETECTED Final   Bordetella Parapertussis NOT DETECTED NOT DETECTED Final   Chlamydophila pneumoniae NOT DETECTED NOT DETECTED Final   Mycoplasma pneumoniae NOT DETECTED NOT DETECTED Final    Comment: Performed at [facility], [address] [place], [place] [address]         Radiology Studies: DG Chest Port 1 View Result Date: 12/10/2023 CLINICAL DATA:  Respiratory failure.  Pneumonia. EXAM: PORTABLE CHEST 1 VIEW COMPARISON:  12/08/2023 FINDINGS: The cardio pericardial silhouette is enlarged. Interval progression of diffuse bilateral airspace disease, now more prominent in the upper lungs. No substantial pleural effusion. No acute bony abnormality. Telemetry leads overlie the chest. IMPRESSION: Interval progression of diffuse bilateral airspace disease, now more prominent in the upper lungs. Findings compatible with diffuse infection or edema. Electronically Signed   By: Camellia Candle M.D.   On: 12/10/2023 08:32        Scheduled Meds:  amLODipine   10 mg Oral Daily   ascorbic acid   500 mg Oral Daily   budesonide  (PULMICORT ) nebulizer solution  0.5 mg Nebulization BID   Chlorhexidine  Gluconate Cloth  6 each Topical Daily   cyanocobalamin   1,000 mcg Intramuscular Q1200   Followed by   NOREEN ON 12/16/2023] vitamin B-12  1,000 mcg Oral Daily   enoxaparin  (LOVENOX ) injection  40 mg Subcutaneous Q24H   famotidine   20 mg Oral BID   fluticasone   2 spray Each Nare Daily   folic acid   1 mg Oral Daily   guaiFENesin   1,200 mg Oral BID   insulin  aspart  0-20 Units Subcutaneous TID WC   insulin  aspart  0-5 Units Subcutaneous QHS   insulin  aspart  4 Units Subcutaneous TID WC   insulin  glargine-yfgn  40 Units Subcutaneous Daily   ipratropium-albuterol   3 mL Nebulization Q4H   lamoTRIgine   150 mg Oral Daily   levothyroxine   75 mcg Oral Q0600   loratadine   10 mg Oral Daily    methylPREDNISolone  (SOLU-MEDROL ) injection  40 mg Intravenous Daily   nicotine   21 mg Transdermal Daily   mouth rinse  15 mL Mouth Rinse 4 times per day   pantoprazole   40 mg Oral Daily   PARoxetine   10 mg Oral QHS   propranolol   20 mg Oral TID   rosuvastatin   5 mg Oral Daily   ziprasidone   60 mg Oral QHS   Continuous Infusions:  azithromycin  Stopped (12/09/23 1607)      LOS: 5 days    Time spent: 40 minutes    Elvan Sor, MD [facility]   To contact the attending provider between 7A-7P or the covering provider during after hours 7P-7A, please log into the web site [email or web address] and access using universal [place] password for that web site. If you do not have the password, please call the hospital operator.  12/10/2023, 1:47 PM

## 2023-12-10 NOTE — Progress Notes (Signed)
   12/10/23 0000  BiPAP/CPAP/SIPAP  BiPAP/CPAP/SIPAP Pt Type Adult  BiPAP/CPAP/SIPAP SERVO  Mask Type Full face mask  Dentures removed? Not applicable  Mask Size Small  Set Rate 15 breaths/min  Respiratory Rate 27 breaths/min  IPAP 10 cmH20  EPAP 5 cmH2O  PEEP 5 cmH20  FiO2 (%) 55 %  Minute Ventilation 15.9  Leak 22  Peak Inspiratory Pressure (PIP) 15  Tidal Volume (Vt) 531  Patient Home Machine No  Patient Home Mask No  Patient Home Tubing No  Auto Titrate No  Press High Alarm 25 cmH2O  CPAP/SIPAP surface wiped down Yes  Device Plugged into RED Power Outlet Yes  BiPAP/CPAP /SiPAP Vitals  Temp 98.2 F (36.8 C)  Pulse Rate 67  Resp 20  BP 127/75  SpO2 96 %  MEWS Score/Color  MEWS Score 0  MEWS Score Color Green

## 2023-12-10 NOTE — Progress Notes (Signed)
   12/10/23 0450  BiPAP/CPAP/SIPAP  BiPAP/CPAP/SIPAP Pt Type Adult  BiPAP/CPAP/SIPAP SERVO  Mask Type Full face mask  Dentures removed? Not applicable  Mask Size Small  Set Rate 15 breaths/min  Respiratory Rate 18 breaths/min  IPAP 10 cmH20  EPAP 5 cmH2O  PEEP 5 cmH20  FiO2 (%) 55 %  Minute Ventilation 8.8  Leak 58  Peak Inspiratory Pressure (PIP) 10  Tidal Volume (Vt) 516  Patient Home Machine No  Patient Home Mask No  Patient Home Tubing No  Auto Titrate No  Press High Alarm 25 cmH2O  Device Plugged into RED Power Outlet Yes

## 2023-12-11 ENCOUNTER — Inpatient Hospital Stay (HOSPITAL_COMMUNITY)

## 2023-12-11 DIAGNOSIS — A419 Sepsis, unspecified organism: Secondary | ICD-10-CM | POA: Diagnosis not present

## 2023-12-11 DIAGNOSIS — J129 Viral pneumonia, unspecified: Secondary | ICD-10-CM

## 2023-12-11 DIAGNOSIS — R6520 Severe sepsis without septic shock: Secondary | ICD-10-CM | POA: Diagnosis not present

## 2023-12-11 DIAGNOSIS — Z87891 Personal history of nicotine dependence: Secondary | ICD-10-CM | POA: Diagnosis not present

## 2023-12-11 DIAGNOSIS — R918 Other nonspecific abnormal finding of lung field: Secondary | ICD-10-CM | POA: Diagnosis not present

## 2023-12-11 LAB — GLUCOSE, CAPILLARY
Glucose-Capillary: 125 mg/dL — ABNORMAL HIGH (ref 70–99)
Glucose-Capillary: 344 mg/dL — ABNORMAL HIGH (ref 70–99)
Glucose-Capillary: 334 mg/dL — ABNORMAL HIGH (ref 70–99)
Glucose-Capillary: 316 mg/dL — ABNORMAL HIGH (ref 70–99)

## 2023-12-11 LAB — VITAMIN D 25 HYDROXY (VIT D DEFICIENCY, FRACTURES): Vit D, 25-Hydroxy: 45.64 ng/mL (ref 30–100)

## 2023-12-11 LAB — BASIC METABOLIC PANEL WITH GFR
Anion gap: 10 (ref 5–15)
BUN: 13 mg/dL (ref 6–20)
CO2: 29 mmol/L (ref 22–32)
Calcium: 8.5 mg/dL — ABNORMAL LOW (ref 8.9–10.3)
Chloride: 97 mmol/L — ABNORMAL LOW (ref 98–111)
Creatinine, Ser: 0.59 mg/dL (ref 0.44–1.00)
GFR, Estimated: >60 mL/min (ref >60)
Glucose, Bld: 143 mg/dL — ABNORMAL HIGH (ref 70–99)
Potassium: 3.2 mmol/L — ABNORMAL LOW (ref 3.5–5.1)
Sodium: 136 mmol/L (ref 135–145)

## 2023-12-11 MED ORDER — FUROSEMIDE 10 MG/ML IJ SOLN
20.0000 mg | Freq: Once | INTRAMUSCULAR | Status: AC
  Administered 2023-12-11: 20 mg via INTRAVENOUS
  Filled 2023-12-11: qty 2

## 2023-12-11 MED ORDER — ORAL CARE MOUTH RINSE: 15.0000 mL | OROMUCOSAL | Status: DC | PRN

## 2023-12-11 MED ORDER — POTASSIUM CHLORIDE CRYS ER 20 MEQ PO TBCR
40.0000 meq | EXTENDED_RELEASE_TABLET | Freq: Once | ORAL | Status: AC
  Administered 2023-12-11: 40 meq via ORAL
  Filled 2023-12-11: qty 2

## 2023-12-11 NOTE — Progress Notes (Signed)
 NAME:  Emily [name], MRN:  [id number], DOB:  1980-12-14, LOS: 6 ADMISSION DATE:  12/04/2023, CONSULTATION DATE:  12/05/23 REFERRING MD:  Toribio Hummer, MD CHIEF COMPLAINT:  Pneumonia   History of Present Illness:  Emily [name] is a 43 year old woman, daily smoker with asthma, DMII, hypertension, hypothyroidism, bipolar disorder, cirrhosis and lupus who is admitted for acute hypoxemic respiratory failure due to pneumonia.   She reports 1 week of progressive dyspnea, cough and wheezing. She was treated with Zpak and prednisone  at an Urgent Care recently but her symptoms did not improve after being on therapy for 2 days.   She is followed by Dr. Geronimo in pulmonary clinic for asthma and lung nodules. Her asthma regimen includes advair diskus 250-50mcg 1 puff twice daily, montelukast  10mg  daily and as needed albuterol .   She is smoking 1 pack per day. She has history of positive ANA testing.  Pertinent  Medical History    has a past medical history of Abnormal CT of liver, Anxiety, Asthma, Asthma, Bipolar 1 disorder (HCC), Bipolar disorder (HCC), Chronic pain, Cirrhosis (HCC), Depression, Diabetes mellitus without complication (HCC), Fatty liver, Gallstones, GERD (gastroesophageal reflux disease), Headache(784.0), Hyperlipidemia, Hypertension, Hypothyroidism, Lupus (systemic lupus erythematosus) (HCC), Pulmonary nodule, Tachycardia, Thyroid disease, and Vitamin D  deficiency.   has a past surgical history that includes Dilation and curettage of uterus and Upper gi endoscopy (08/28/2022).  Significant Hospital Events: Including procedures, antibiotic start and stop dates in addition to other pertinent events   8/16 admitted for respiratory failure and pneumonia, Viral panel positive for parainfluenza virus 8/17  - She is tearful this morning and wanting to go home. Mother at bedside. She has been weaned to 8L O2 8/1: HFNC 12L . Afebrile. Pulse ox 88-91%. PAtient and mom at bedside. She  is very fearful of ending up oin ventilator and dying. She is in tears. Refused BiPAP but after counseling agreed to try on. Also feels beathin worse afte rIV steroids changed to po per patient and mom.  Mom reports multiple family members sick 8/18-8/20 solumedrol 125mg  TID, ESR 84 8/20 used BiPAP last night and day. On15L HHFNC. Feeling better. On IV Solumedrol since yesterday. Wanting antibiotics. Not interested in doxy. Off precedex  (needed for BiPAP 8/21 using nocturnal bipap, feeling better, remains off precedex , weaned to [place] HFNC 13L, good response to lasix   Interim History / Subjective:  Continues to clinically feel better after good response to lasix  yesterday.   Currently on 8L HFNC  Objective    Blood pressure (!) 141/95, pulse 85, temperature 98.2 F (36.8 C), temperature source Oral, resp. rate (!) 23, height 5' 3.5 (1.613 m), weight 80.6 kg, last menstrual period 10/29/2023, SpO2 94%.    FiO2 (%):  [55 %-73 %] 55 % PEEP:  [5 cmH20] 5 cmH20   Intake/Output Summary (Last 24 hours) at 12/11/2023 1052 Last data filed at 12/11/2023 0500 Gross per 24 hour  Intake 310 ml  Output 2400 ml  Net -2090 ml    Filed Weights   12/05/23 0237 12/10/23 1000 12/11/23 0537  Weight: 82 kg 79.8 kg 80.6 kg    Examination: General:  Pleasant adult female sitting in bed in NAD HEENT: MM pink/moist Neuro: AO, appropriate, more uplifted in mood today CV: rr, NSR PULM:  non labored, no conversational dyspnea.  Bibasilar insp rales, few insp wheezes scattered  GI: soft, bsx4 active  Extremities: warm/dry, no pitting LE edema  Skin: no rashes    Afebrile CXR reviewed> slight  improvement in upper lobes, residual BLL interval progression of upper airway disease bilaterally patchy infiltrates  Day 4x of azithro UOP 2.4L/ 24hrs  Labs> K 3.2, sCr 0.59   Resolved problem list   Assessment and Plan  Acute Hypoxemic Respiratory Failure Parainfluenza/ pneumonitis  Sepsis due to Viral  Pneumonia, resolved Asthma with Acute Exacerbation Cigarette Smoker DMII Cirrhosis Hx of Lupus Chronic anxiety with acute anxiety associated with BiPAP - CT chest consistent with viral pneumonia +/- associated acute lung injury - Culture negative, azithromycin  started 8/19 - off precedex  8/20 Recommendations: - making slow but continued improvement - cont to wean O2 for sat goal of >92%, prn BiPAP at night - s/p KCL replete this am.  Clinical improvement with O2 requirements and mild improvement on CXR.  Will repeat lasix  20mg  with KCL this afternoon. - encourage IS, flutter, OOB  - complete 5-7 days of azithro - cont solumedrol 40mg  daily started 8/20 after completion of 3 days of high dose steroids.  will need very slow taper - cont duonebsq4, pulmicort  - ensure good sleep hygiene - ongoing smoking cessation- pt does not want to restart smoking.  Using nicotine  patch - cont H2B - pulmonary will see again 8/23  Hypertension Allergies/rhinitis GERD Diabetes mellitus Hypothyroidism Hyperlipidemia - As per [facility] Plans  Best Practice (right click and Reselect all SmartList Selections daily)   Diet/type: Regular consistency (see orders) DVT prophylaxis LMWH Pressure ulcer(s): N/A GI prophylaxis: N/A Lines: N/A Foley:  N/A Code Status:  full code Last date of multidisciplinary goals of care discussion [per primary team]  Pt and mother updated at bedside 8/22  LABS    PULMONARY Recent Labs  Lab 12/05/23 0818  PHART 7.4  PCO2ART 46  PO2ART 65*  HCO3 28.5*  O2SAT 94.6    CBC Recent Labs  Lab 12/08/23 0400 12/09/23 0332 12/10/23 0349  HGB 13.1 13.3 13.2  HCT 38.2 40.0 38.8  WBC 11.3* 11.3* 13.2*  PLT 168 176 160    COAGULATION Recent Labs  Lab 12/05/23 0541  INR 1.1    CARDIAC  No results for input(s): TROPONINI in the last 168 hours. No results for input(s): PROBNP in the last 168 hours.   CHEMISTRY Recent Labs  Lab 12/06/23 0252  12/07/23 0254 12/08/23 0400 12/09/23 0332 12/10/23 0349 12/10/23 2222 12/11/23 0331  NA 133* 138 135 137 137 132* 136  K 3.5 3.3* 4.1 3.5 3.7 3.5 3.2*  CL 99 101 101 100 98 93* 97*  CO2 25 26 23 27 28 27 29   GLUCOSE 335* 168* 277* 270* 238* 359* 143*  BUN 9 10 16 20 17 16 13   CREATININE 0.42* 0.43* 0.54 0.41* 0.52 0.75 0.59  CALCIUM  8.4* 8.6* 8.4* 8.6* 8.8* 8.4* 8.5*  MG 2.1 2.2 2.5* 2.4 2.3  --   --   PHOS 2.8 3.2 3.5  --  3.9  --   --    Estimated Creatinine Clearance: 93.1 mL/min (by C-G formula based on SCr of 0.59 mg/dL).   LIVER Recent Labs  Lab 12/05/23 0541 12/06/23 0252 12/07/23 0254 12/08/23 0400  AST 22  --   --   --   ALT 18  --   --   --   ALKPHOS 113  --   --   --   BILITOT 1.0  --   --   --   PROT 7.9  --   --   --   ALBUMIN 3.3* 3.0* 2.8* 2.7*  INR 1.1  --   --   --  INFECTIOUS Recent Labs  Lab 12/05/23 0541 12/05/23 1145 12/08/23 0400  LATICACIDVEN 1.6  --   --   PROCALCITON  --  <0.10 0.15     ENDOCRINE CBG (last 3)  Recent Labs    12/10/23 1653 12/10/23 2136 12/11/23 0738  GLUCAP 244* 403* 125*    CCT n/a  Lyle Pesa, MSN, AG-ACNP-BC [facility] 12/11/2023, 10:52 AM  See Amion for pager If no response to pager , please call [phone] until 7pm After 7:00 pm call Elink  [phone]

## 2023-12-11 NOTE — Progress Notes (Signed)
 PROGRESS NOTE    Emily [name]  FMW:[id number] DOB: 08/27/1980 DOA: 12/04/2023 PCP: [name], NP    Chief Complaint  Patient presents with   Chest Pain   Shortness of Breath    Brief Narrative:  Patient 43 year old female with history of asthma, chronic bronchitis and rhinitis, ongoing tobacco abuse, type 2 diabetes, hypertension, hyperlipidemia, hypothyroidism, lupus, cirrhosis, bipolar disorder, depression and anxiety, GERD who presented with complaints of worsening shortness of breath.  Chest x-ray done concerning for left lobe pneumonia.  Patient noted to be in hypoxic respiratory distress requiring 15 L high flow nasal cannula.  Patient placed on empiric IV antibiotics.  PCCM consulted.  Assessment & Plan:   Principal Problem:   Severe sepsis (HCC) Active Problems:   Acute respiratory failure with hypoxia (HCC)   Infection due to parainfluenza virus 3   TOBACCO ABUSE   Acute asthma exacerbation   G E REFLUX   Essential hypertension   Hypothyroidism   Type 2 diabetes mellitus (HCC)   Gastroesophageal reflux disease   Chronic bronchitis (HCC)   Generalized anxiety disorder   Major depressive disorder, recurrent severe without psychotic features (HCC)   Viral pneumonia  # Severe sepsis secondary to parainfluenza virus 3 pneumonia -Patient on admission met criteria for severe sepsis with tachypnea, leukocytosis, chest x-ray with patchy left mid and lower lung opacities concerning for pneumonia. - Blood cultures pending. - MRSA PCR negative. - SARS coronavirus 2 PCR negative, influenza A and B by PCR negative, RSV by PCR negative.  # Respiratory viral panel positive for parainfluenza virus 3 -ABG with a pH of 7.4, pCO2 of 46, pO2 of 65, bicarb of 28.5. -D-dimer negative. - Sputum Gram stain and cultures pending. - Patient states some clinical improvement. - Patient still requiring high O2 requirements and currently on the BiPAP.  - Procalcitonin noted to  be negative.   - Patient seen by PCCM and felt patient's symptoms secondary to a viral pneumonia and as such antibiotics have been discontinued.  - Continue Pulmicort  twice daily, Brovana  twice daily, Flonase , scheduled DuoNebs, Claritin , PPI, Mucinex , IV Solu-Medro. -Continue BiPAP. -Patient with worsening hypoxia and as such CT chest ordered per PCCM was done which showed diffuse ground glass opacities throughout bilateral lungs with superimposed interlobular interlobular septal thickening compatible with crazy paving pattern.  Also associated superimposed areas of solid lung opacification bilaterally more in the dependent portions.  Nonspecific differential diagnosis includes ARDS, multilobar pneumonia, pulmonary alveolar proteinosis, pulmonary edema,.  -s/p Lasix  40 mg IV x 1 - Due to increased high O2 requirements patient seen in consultation by PCCM who are following and appreciate input and recommendations.  8/21 s/p Lasix  40 mg x 1 dose given by pulm 8/22 currently patient is on 8 L oxygen HFNC, gradually improving.  Lasix  20 mg x 1 dose given  #  Acute exacerbation of asthma/chronic bronchitis versus acute COPD exacerbation -Likely exacerbated by parainfluenza virus 3 infection/pneumonia. - Patient noted to have wheezing and rhonchi on examination on presentation with some clinical improvement with increased O2 requirements. - Was slowly clinically improving however patient now with increased O2 requirements currently on 13 L high flow nasal cannula. - Was on IV Solu-Medrol  and has been transitioned to a prednisone  taper per PCCM.   -Due to increasing O2 requirements patient placed back on IV Solu-Medrol  per PCCM.] - CT chest done per PCCM that showed diffuse ground glass opacities throughout bilateral lungs with superimposed interlobular interlobular septal thickening compatible with crazy  paving pattern.  Also associated superimposed areas of solid lung opacification bilaterally more in  the dependent portions.  Nonspecific differential diagnosis includes ARDS, multilobar pneumonia, pulmonary alveolar proteinosis, pulmonary edema,.  -Will give a dose of Lasix  40 mg IV x 1. - Continue scheduled DuoNebs, Pulmicort , Brovana , Mucinex , Claritin , Flonase , PPI.   -Patient on BiPAP. - PCCM following and appreciate input and recommendations.   #  Acute hypoxic respiratory failure -Likely secondary to problems #1 and 2 versus developing ALI. See #1 and 2. -Patient with increasing O2 requirements currently on the BiPAP.  -Respiratory viral panel positive for parainfluenza virus 3. - May need to consider CT chest due to worsening hypoxia however will defer to PCCM/pulmonary.  -Patient placed back on IV Solu-Medrol , BiPAP and CT chest done per PCCM that showed diffuse ground glass opacities throughout bilateral lungs with superimposed interlobular interlobular septal thickening compatible with crazy paving pattern.  Also associated superimposed areas of solid lung opacification bilaterally more in the dependent portions.  Nonspecific differential diagnosis includes ARDS, multilobar pneumonia, pulmonary alveolar proteinosis, pulmonary edema,.  -Will give a dose of Lasix  40 mg IV x 1. -Patient seen by PCCM who are following and appreciate their input and recommendations.  # Hypokalemia, potassium repleted. Check BMP daily  #  Mild hyponatremia --Felt likely secondary to poor oral intake in the setting of acute illness. -Improved with hydration.  #  Tobacco abuse -Tobacco cessation counseling done - Nicotine  patch.  # Poorly controlled diabetes mellitus type 2 -Initial glucose of 474 on presentation in the setting of steroid use. - Hemoglobin A1c noted at 10.9 (05/20/2023). - CBG noted at 329 this morning. - Repeat hemoglobin A1c 9.6.  -Increase Semglee  to 40 units daily. -Patient currently on BiPAP and as such meal cover NovoLog  has been discontinued. -Continue resistance scale  SSI. -Diabetes coordinator following.  #  Cirrhosis -Patient with no signs of ascites or hepatic encephalopathy at this time. - LFTs within normal limits. - Continue propranolol .   #  Hypothyroidism - Continue Synthroid .  #  Hyperlipidemia - Continue Crestor .  #  Bipolar disorder/anxiety/depression - Continue clonazepam  twice daily as needed.   #  Hypertension - Increase Norvasc  to 10 mg daily.  - Continue propranolol .  #  GERD - Continue PPI.    #  SLE - Outpatient follow-up.  #  Hypokalemia - Repleted.   - Potassium of 4.1.   # Macrocytosis possible secondary to B12 and folate deficiency. MCV 101.8 # Folic acid  level 6.0, at lower end, started folic acid  1 mg p.o. daily.  Follow with PCP repeat folic acid  level after 3 to 6 months. # Vitamin B12 level 252, goal >400.  Started vitamin B12 1000 mcg IM injection daily during hospital stay followed by oral supplement.  Follow with PCP to repeat vitamin B12 level after 3 to 6 months.  # Hypocalcemia, most likely due to hypoalbuminemia. Vt D level wnl   DVT prophylaxis: Lovenox  Code Status: Full Family Communication: Updated her mother and father at bedside Disposition: Likely home when clinically improved and cleared by pulmonary/PCCM.  Status is: Inpatient Remains inpatient appropriate because: Severity of illness   Consultants:  PCCM: Dr Kara 12/05/2023  Procedures:  Chest x-ray 12/04/2023, 12/06/2023 CT chest 12/07/2023  Antimicrobials:  Anti-infectives (From admission, onward)    Start     Dose/Rate Route Frequency Ordered Stop   12/08/23 1400  azithromycin  (ZITHROMAX ) 500 mg in sodium chloride  0.9 % 250 mL IVPB  500 mg 250 mL/hr over 60 Minutes Intravenous Every 24 hours 12/08/23 1129     12/05/23 0100  cefTRIAXone  (ROCEPHIN ) 2 g in sodium chloride  0.9 % 100 mL IVPB  Status:  Discontinued        2 g 200 mL/hr over 30 Minutes Intravenous Every 24 hours 12/05/23 0040 12/05/23 1647   12/05/23 0045   cefTRIAXone  (ROCEPHIN ) 1 g in sodium chloride  0.9 % 100 mL IVPB  Status:  Discontinued        1 g 200 mL/hr over 30 Minutes Intravenous Every 24 hours 12/05/23 0034 12/05/23 0039   12/05/23 0045  azithromycin  (ZITHROMAX ) 500 mg in sodium chloride  0.9 % 250 mL IVPB  Status:  Discontinued        500 mg 250 mL/hr over 60 Minutes Intravenous Every 24 hours 12/05/23 0034 12/05/23 1647         Subjective: Patient was seen and examined at bedside today. Patient was sitting comfortably in the recliner, she did walk around.  Feeling a lot of improvement.  Currently patient is on 8 L oxygen via nasal cannula.  Denied any active issues. Patient seems very happy with her improvement.   Objective: Vitals:   12/11/23 1300 12/11/23 1353 12/11/23 1400 12/11/23 1500  BP: 116/73  127/76 127/80  Pulse: 86  82 95  Resp: (!) 27  (!) 22 18  Temp:      TempSrc:      SpO2: 95% 94% 97% 92%  Weight:      Height:        Intake/Output Summary (Last 24 hours) at 12/11/2023 1536 Last data filed at 12/11/2023 0500 Gross per 24 hour  Intake 310 ml  Output 1100 ml  Net -790 ml   Filed Weights   12/05/23 0237 12/10/23 1000 12/11/23 0537  Weight: 82 kg 79.8 kg 80.6 kg    Examination:  General exam: Appears calm and comfortable.  Poor dentition.  On BiPAP. Respiratory system: Equal air entry bilaterally, bilateral crackles and mild wheezes. Cardiovascular system: RRR no murmurs rubs or gallops.  No JVD.  No pitting lower extremity edema. Gastrointestinal system: Abdomen is soft, nontender, nondistended, positive bowel sounds.  No rebound.  No guarding.  Central nervous system: Alert and oriented. No focal neurological deficits. Extremities: Symmetric 5 x 5 power. Skin: No rashes, lesions or ulcers Psychiatry: Judgement and insight appear normal. Mood & affect appropriate.     Data Reviewed: I have personally reviewed following labs and imaging studies  CBC: Recent Labs  Lab 12/06/23 0252  12/07/23 0254 12/08/23 0400 12/09/23 0332 12/10/23 0349  WBC 14.8* 16.5* 11.3* 11.3* 13.2*  NEUTROABS 11.6* 10.0* 8.6* 8.7*  --   HGB 13.6 13.9 13.1 13.3 13.2  HCT 41.1 40.8 38.2 40.0 38.8  MCV 101.0* 101.2* 100.5* 101.8* 103.2*  PLT 165 179 168 176 160    Basic Metabolic Panel: Recent Labs  Lab 12/06/23 0252 12/07/23 0254 12/08/23 0400 12/09/23 0332 12/10/23 0349 12/10/23 2222 12/11/23 0331  NA 133* 138 135 137 137 132* 136  K 3.5 3.3* 4.1 3.5 3.7 3.5 3.2*  CL 99 101 101 100 98 93* 97*  CO2 25 26 23 27 28 27 29   GLUCOSE 335* 168* 277* 270* 238* 359* 143*  BUN 9 10 16 20 17 16 13   CREATININE 0.42* 0.43* 0.54 0.41* 0.52 0.75 0.59  CALCIUM  8.4* 8.6* 8.4* 8.6* 8.8* 8.4* 8.5*  MG 2.1 2.2 2.5* 2.4 2.3  --   --   PHOS  2.8 3.2 3.5  --  3.9  --   --     GFR: Estimated Creatinine Clearance: 93.1 mL/min (by C-G formula based on SCr of 0.59 mg/dL).  Liver Function Tests: Recent Labs  Lab 12/05/23 0541 12/06/23 0252 12/07/23 0254 12/08/23 0400  AST 22  --   --   --   ALT 18  --   --   --   ALKPHOS 113  --   --   --   BILITOT 1.0  --   --   --   PROT 7.9  --   --   --   ALBUMIN 3.3* 3.0* 2.8* 2.7*    CBG: Recent Labs  Lab 12/10/23 1214 12/10/23 1653 12/10/23 2136 12/11/23 0738 12/11/23 1200  GLUCAP 269* 244* 403* 125* 344*     Recent Results (from the past 240 hours)  Resp panel by RT-PCR (RSV, Flu A&B, Covid) Anterior Nasal Swab     Status: None   Collection Time: 12/04/23 10:57 PM   Specimen: Anterior Nasal Swab  Result Value Ref Range Status   SARS Coronavirus 2 by RT PCR NEGATIVE NEGATIVE Final    Comment: (NOTE) SARS-CoV-2 target nucleic acids are NOT DETECTED.  The SARS-CoV-2 RNA is generally detectable in upper respiratory specimens during the acute phase of infection. The lowest concentration of SARS-CoV-2 viral copies this assay can detect is 138 copies/mL. A negative result does not preclude SARS-Cov-2 infection and should not be used as the  sole basis for treatment or other patient management decisions. A negative result may occur with  improper specimen collection/handling, submission of specimen other than nasopharyngeal swab, presence of viral mutation(s) within the areas targeted by this assay, and inadequate number of viral copies(<138 copies/mL). A negative result must be combined with clinical observations, patient history, and epidemiological information. The expected result is Negative.  Fact Sheet for Patients:  BloggerCourse.com  Fact Sheet for Healthcare Providers:  SeriousBroker.it  This test is no t yet approved or cleared by the [place]  FDA and  has been authorized for detection and/or diagnosis of SARS-CoV-2 by FDA under an Emergency Use Authorization (EUA). This EUA will remain  in effect (meaning this test can be used) for the duration of the COVID-19 declaration under Section 564(b)(1) of the Act, 21 U.S.C.section 360bbb-3(b)(1), unless the authorization is terminated  or revoked sooner.       Influenza A by PCR NEGATIVE NEGATIVE Final   Influenza B by PCR NEGATIVE NEGATIVE Final    Comment: (NOTE) The Xpert Xpress SARS-CoV-2/FLU/RSV plus assay is intended as an aid in the diagnosis of influenza from Nasopharyngeal swab specimens and should not be used as a sole basis for treatment. Nasal washings and aspirates are unacceptable for Xpert Xpress SARS-CoV-2/FLU/RSV testing.  Fact Sheet for Patients: BloggerCourse.com  Fact Sheet for Healthcare Providers: SeriousBroker.it  This test is not yet approved or cleared by the [place]  FDA and has been authorized for detection and/or diagnosis of SARS-CoV-2 by FDA under an Emergency Use Authorization (EUA). This EUA will remain in effect (meaning this test can be used) for the duration of the COVID-19 declaration under Section 564(b)(1) of the  Act, 21 U.S.C. section 360bbb-3(b)(1), unless the authorization is terminated or revoked.     Resp Syncytial Virus by PCR NEGATIVE NEGATIVE Final    Comment: (NOTE) Fact Sheet for Patients: BloggerCourse.com  Fact Sheet for Healthcare Providers: SeriousBroker.it  This test is not yet approved or cleared by the [place]  FDA  and has been authorized for detection and/or diagnosis of SARS-CoV-2 by FDA under an Emergency Use Authorization (EUA). This EUA will remain in effect (meaning this test can be used) for the duration of the COVID-19 declaration under Section 564(b)(1) of the Act, 21 U.S.C. section 360bbb-3(b)(1), unless the authorization is terminated or revoked.  Performed at [facility], [address], [place], [place] [address]   MRSA Next Gen by PCR, Nasal     Status: None   Collection Time: 12/05/23  2:25 AM   Specimen: Nasal Mucosa; Nasal Swab  Result Value Ref Range Status   MRSA by PCR Next Gen NOT DETECTED NOT DETECTED Final    Comment: (NOTE) The GeneXpert MRSA Assay (FDA approved for NASAL specimens only), is one component of a comprehensive MRSA colonization surveillance program. It is not intended to diagnose MRSA infection nor to guide or monitor treatment for MRSA infections. Test performance is not FDA approved in patients less than 71 years old. Performed at [facility], [address], [place], [place] [address]   Culture, blood (Routine X 2) w Reflex to ID Panel     Status: None   Collection Time: 12/05/23  5:41 AM   Specimen: BLOOD LEFT ARM  Result Value Ref Range Status   Specimen Description   Final    BLOOD LEFT ARM Performed at [facility], [address] [place], [place] [address]    Special Requests   Final    BOTTLES DRAWN AEROBIC AND ANAEROBIC Blood Culture adequate volume Performed at [facility], [address] [place], [place] [address]    Culture   Final    NO GROWTH 5 DAYS Performed at [facility], [address], [place], [place] [address]    Report Status 12/10/2023 FINAL  Final  Culture, blood (Routine X 2) w Reflex to ID Panel     Status: None   Collection Time: 12/05/23  5:41 AM   Specimen: BLOOD LEFT ARM  Result Value Ref Range Status   Specimen Description   Final    BLOOD LEFT ARM Performed at [facility], [address] [place], [place] [address]    Special Requests   Final    BOTTLES DRAWN AEROBIC AND ANAEROBIC Blood Culture adequate volume Performed at [facility], [address] [place], [place] [address]    Culture   Final    NO GROWTH 5 DAYS Performed at [facility], [address] [place], [place] [address]    Report Status 12/10/2023 FINAL  Final  Expectorated Sputum Assessment w Gram Stain, Rflx to Resp Cult     Status: None   Collection Time: 12/05/23  1:29 PM   Specimen: Sputum  Result Value Ref Range Status   Specimen Description SPUTUM  Final   Special Requests NONE  Final   Sputum evaluation   Final    Sputum specimen not acceptable for testing.  Please recollect.   NOTIFIED H. SCHULTZ,RN ON 12/05/2023 AT 1347 BY SL Performed at [facility], [address], [place], [place] [address]    Report Status 12/05/2023 FINAL  Final  Respiratory (~20 pathogens) panel by PCR     Status: Abnormal   Collection Time: 12/05/23  1:29 PM   Specimen: Nasopharyngeal Swab; Respiratory  Result Value Ref Range Status   Adenovirus NOT DETECTED NOT DETECTED Final   Coronavirus 229E NOT DETECTED NOT DETECTED Final  Comment: (NOTE) The Coronavirus on the Respiratory Panel, DOES NOT test for the novel  Coronavirus (2019 nCoV)    Coronavirus HKU1 NOT DETECTED NOT DETECTED Final   Coronavirus NL63 NOT DETECTED NOT DETECTED Final   Coronavirus OC43 NOT DETECTED NOT DETECTED Final   Metapneumovirus NOT DETECTED NOT  DETECTED Final   Rhinovirus / Enterovirus NOT DETECTED NOT DETECTED Final   Influenza A NOT DETECTED NOT DETECTED Final   Influenza B NOT DETECTED NOT DETECTED Final   Parainfluenza Virus 1 NOT DETECTED NOT DETECTED Final   Parainfluenza Virus 2 NOT DETECTED NOT DETECTED Final   Parainfluenza Virus 3 DETECTED (A) NOT DETECTED Final   Parainfluenza Virus 4 NOT DETECTED NOT DETECTED Final   Respiratory Syncytial Virus NOT DETECTED NOT DETECTED Final   Bordetella pertussis NOT DETECTED NOT DETECTED Final   Bordetella Parapertussis NOT DETECTED NOT DETECTED Final   Chlamydophila pneumoniae NOT DETECTED NOT DETECTED Final   Mycoplasma pneumoniae NOT DETECTED NOT DETECTED Final    Comment: Performed at [facility], [address], [place], [place] [address]         Radiology Studies: DG Chest Port 1 View Result Date: 12/11/2023 CLINICAL DATA:  Follow-up pneumonia EXAM: PORTABLE CHEST 1 VIEW COMPARISON:  12/10/2023 FINDINGS: Artifact overlies the chest. Widespread hazy/patchy infiltrates are slightly improved since yesterday. No worsening or new finding. No evidence of lobar collapse or pleural effusion. IMPRESSION: Slight improvement in widespread hazy/patchy infiltrates since yesterday. Electronically Signed   By: Oneil Officer M.D.   On: 12/11/2023 10:28   DG Chest Port 1 View Result Date: 12/10/2023 CLINICAL DATA:  Respiratory failure.  Pneumonia. EXAM: PORTABLE CHEST 1 VIEW COMPARISON:  12/08/2023 FINDINGS: The cardio pericardial silhouette is enlarged. Interval progression of diffuse bilateral airspace disease, now more prominent in the upper lungs. No substantial pleural effusion. No acute bony abnormality. Telemetry leads overlie the chest. IMPRESSION: Interval progression of diffuse bilateral airspace disease, now more prominent in the upper lungs. Findings compatible with diffuse infection or edema. Electronically Signed   By: Camellia Candle M.D.   On: 12/10/2023 08:32         Scheduled Meds:  amLODipine   10 mg Oral Daily   ascorbic acid   500 mg Oral Daily   budesonide  (PULMICORT ) nebulizer solution  0.5 mg Nebulization BID   Chlorhexidine  Gluconate Cloth  6 each Topical Daily   cyanocobalamin   1,000 mcg Intramuscular Q1200   Followed by   NOREEN ON 12/16/2023] vitamin B-12  1,000 mcg Oral Daily   enoxaparin  (LOVENOX ) injection  40 mg Subcutaneous Q24H   famotidine   20 mg Oral BID   fluticasone   2 spray Each Nare Daily   folic acid   1 mg Oral Daily   guaiFENesin   1,200 mg Oral BID   insulin  aspart  0-20 Units Subcutaneous TID WC   insulin  aspart  0-5 Units Subcutaneous QHS   insulin  aspart  4 Units Subcutaneous TID WC   insulin  glargine-yfgn  40 Units Subcutaneous Daily   ipratropium-albuterol   3 mL Nebulization Q4H   lamoTRIgine   150 mg Oral Daily   levothyroxine   75 mcg Oral Q0600   loratadine   10 mg Oral Daily   methylPREDNISolone  (SOLU-MEDROL ) injection  40 mg Intravenous Daily   nicotine   21 mg Transdermal Daily   mouth rinse  15 mL Mouth Rinse 4 times per day   pantoprazole   40 mg Oral Daily   PARoxetine   10 mg Oral QHS   propranolol   20 mg Oral  TID   rosuvastatin   5 mg Oral Daily   ziprasidone   60 mg Oral QHS   Continuous Infusions:  azithromycin  500 mg (12/11/23 1417)      LOS: 6 days    Time spent: 40 minutes    Elvan Sor, MD [facility]   To contact the attending provider between 7A-7P or the covering provider during after hours 7P-7A, please log into the web site [email or web address] and access using universal [place] password for that web site. If you do not have the password, please call the hospital operator.  12/11/2023, 3:36 PM

## 2023-12-11 NOTE — Progress Notes (Signed)
 Patients blood glucose this evening was 404, as per orders the provider was contacted and new insulin  coverage for tonight was changed to 6 units, also as per orders a stat BMP was order results of patient glucose was 359, no other new orders placed.

## 2023-12-11 NOTE — Evaluation (Signed)
 Physical Therapy Evaluation Patient Details Name: Emily [name] MRN: [id number] DOB: 15-Aug-1980 Today's Date: 12/11/2023  History of Present Illness  42 yo female presents to therapy following hospital admission on 12/04/2023 due to SOB, productive cough and wheezing. Pt was see OP and prescribed ABX and prednisone  with no resolve of symptoms. Pt found to have sepsis and acute hypoxic respiratory failure secondary to CAP and parainfluenza 3. Pt PMH includes but is not limited to asthma, chronic bronchitis, tobacco abuse, DM II, HTN, HLD, hypothyroidism, lupus, cirrhosis, biploar disorder, GAD, and GERD.  Clinical Impression   Pt admitted with above diagnosis.  Pt currently with functional limitations due to the deficits listed below (see PT Problem List). Pt seated in recliner when PT arrived. Mother present and father entered room during eval. Pt is motivated to participate with therapy and expressed some apprehension about walking for the first time in almost a week. Pt transitioned from 13 L/min to 8 L/min HFNC and maintaining O2 saturation >/=93% PT placed pt on 8 L/min via [place] on mobile tank. Pt desaturated to 89% with gait tasks and recovered quickly 90-92% with cues for pursed lip breathing. Pt is CGA for sit to stand, CGA for gait tasks with RW and min cues 100 feet in hallway. Pt may benefit from rollator trial. Pt left seated in recliner, family present and all needs in place.  Pt will benefit from acute skilled PT to increase their independence and safety with mobility to allow discharge.       If plan is discharge home, recommend the following: A little help with walking and/or transfers;A little help with bathing/dressing/bathroom;Assistance with cooking/housework;Assist for transportation   Can travel by private Data processing manager (4 wheels)  Recommendations for Other Services       Functional Status Assessment Patient has had a recent decline in  their functional status and demonstrates the ability to make significant improvements in function in a reasonable and predictable amount of time.     Precautions / Restrictions Precautions Precautions: Fall Restrictions Weight Bearing Restrictions Per Provider Order: No      Mobility  Bed Mobility               General bed mobility comments: pt seated in recliner when PT arrived    Transfers Overall transfer level: Needs assistance Equipment used: Rolling walker (2 wheels) Transfers: Sit to/from Stand Sit to Stand: Contact guard assist           General transfer comment: min cues    Ambulation/Gait Ambulation/Gait assistance: Contact guard assist Gait Distance (Feet): 100 Feet Assistive device: Rolling walker (2 wheels) Gait Pattern/deviations: Step-through pattern, Trunk flexed Gait velocity: decreased     General Gait Details: min cues for safety and RW management as well as pursed lip breathing , pt likes to talk and required redirection for focusing on breathing strataties to maintain O2 saturation  on 8 L/min  Stairs            Wheelchair Mobility     Tilt Bed    Modified Rankin (Stroke Patients Only)       Balance Overall balance assessment: Mild deficits observed, not formally tested                                           Pertinent Vitals/Pain Pain Assessment  Pain Assessment: No/denies pain    Home Living Family/patient expects to be discharged to:: Private residence Living Arrangements: Alone Available Help at Discharge: Family Type of Home: Apartment Home Access: Level entry       Home Layout: One level Home Equipment: None Additional Comments: mother reported pt is able to come and stay with her and has one step to enter    Prior Function Prior Level of Function : Independent/Modified Independent             Mobility Comments: IND no AD for all ADLs, self care tasks, IADLs and not driving        Extremity/Trunk Assessment        Lower Extremity Assessment Lower Extremity Assessment: Overall WFL for tasks assessed    Cervical / Trunk Assessment Cervical / Trunk Assessment: Normal  Communication   Communication Communication: No apparent difficulties    Cognition Arousal: Alert Behavior During Therapy: WFL for tasks assessed/performed   PT - Cognitive impairments: No apparent impairments                         Following commands: Intact       Cueing       General Comments General comments (skin integrity, edema, etc.): HHFNC 8 L/min  at rest and 8 L/min via [place] on mobile tank during gait tasks    Exercises     Assessment/Plan    PT Assessment Patient needs continued PT services  PT Problem List Decreased activity tolerance;Decreased balance;Decreased mobility;Decreased coordination;Cardiopulmonary status limiting activity       PT Treatment Interventions DME instruction;Gait training;Functional mobility training;Therapeutic activities;Therapeutic exercise;Balance training;Neuromuscular re-education;Patient/family education    PT Goals (Current goals can be found in the Care Plan section)  Acute Rehab PT Goals Patient Stated Goal: to be able to get stronger and go home PT Goal Formulation: With patient Time For Goal Achievement: 01/01/24 Potential to Achieve Goals: Good    Frequency Min 3X/week     Co-evaluation               AM-PAC PT 6 Clicks Mobility  Outcome Measure Help needed turning from your back to your side while in a flat bed without using bedrails?: A Little Help needed moving from lying on your back to sitting on the side of a flat bed without using bedrails?: A Little Help needed moving to and from a bed to a chair (including a wheelchair)?: A Little Help needed standing up from a chair using your arms (e.g., wheelchair or bedside chair)?: A Little Help needed to walk in hospital room?: A Little Help needed  climbing 3-5 steps with a railing? : Total 6 Click Score: 16    End of Session Equipment Utilized During Treatment: Gait belt;Oxygen Activity Tolerance: Patient tolerated treatment well Patient left: in chair;with call bell/phone within reach;with family/visitor present Nurse Communication: Mobility status PT Visit Diagnosis: Unsteadiness on feet (R26.81);Other abnormalities of gait and mobility (R26.89);Difficulty in walking, not elsewhere classified (R26.2)    Time: 1210-1230 PT Time Calculation (min) (ACUTE ONLY): 20 min   Charges:   PT Evaluation $PT Eval Low Complexity: 1 Low   PT General Charges $$ ACUTE PT VISIT: 1 Visit         [place], PT Acute Rehab   [place] VEAR Drone 12/11/2023, 2:03 PM

## 2023-12-11 NOTE — Plan of Care (Signed)
 Patient remains on salter at 13 liters via nasal canula, no complaints of shortness of breath at time of assessment, patient to wear BiPAP at night, states will wear 30 minutes to 1 hour after she takes her medications, plan of care and goals reviewed, patient handbook/guide at bedside, bed in lowest locked position with side rails up, call bell in reach, bed alarm on and met on floor, also family member remains at bedside overnight.  Problem: Education: Goal: Knowledge of General Education information will improve Description: Including pain rating scale, medication(s)/side effects and non-pharmacologic comfort measures Outcome: Progressing   Problem: Health Behavior/Discharge Planning: Goal: Ability to manage health-related needs will improve Outcome: Progressing   Problem: Clinical Measurements: Goal: Ability to maintain clinical measurements within normal limits will improve Outcome: Progressing Goal: Will remain free from infection Outcome: Progressing Goal: Diagnostic test results will improve Outcome: Progressing Goal: Respiratory complications will improve Outcome: Progressing Goal: Cardiovascular complication will be avoided Outcome: Progressing   Problem: Activity: Goal: Risk for activity intolerance will decrease Outcome: Progressing   Problem: Nutrition: Goal: Adequate nutrition will be maintained Outcome: Progressing   Problem: Coping: Goal: Level of anxiety will decrease Outcome: Progressing   Problem: Elimination: Goal: Will not experience complications related to bowel motility Outcome: Progressing Goal: Will not experience complications related to urinary retention Outcome: Progressing   Problem: Pain Managment: Goal: General experience of comfort will improve and/or be controlled Outcome: Progressing   Problem: Safety: Goal: Ability to remain free from injury will improve Outcome: Progressing   Problem: Skin Integrity: Goal: Risk for impaired skin  integrity will decrease Outcome: Progressing   Problem: Activity: Goal: Ability to tolerate increased activity will improve Outcome: Progressing   Problem: Clinical Measurements: Goal: Ability to maintain a body temperature in the normal range will improve Outcome: Progressing   Problem: Respiratory: Goal: Ability to maintain adequate ventilation will improve Outcome: Progressing Goal: Ability to maintain a clear airway will improve Outcome: Progressing   Problem: Education: Goal: Ability to describe self-care measures that may prevent or decrease complications (Diabetes Survival Skills Education) will improve Outcome: Progressing Goal: Individualized Educational Video(s) Outcome: Progressing   Problem: Coping: Goal: Ability to adjust to condition or change in health will improve Outcome: Progressing   Problem: Fluid Volume: Goal: Ability to maintain a balanced intake and output will improve Outcome: Progressing   Problem: Health Behavior/Discharge Planning: Goal: Ability to identify and utilize available resources and services will improve Outcome: Progressing Goal: Ability to manage health-related needs will improve Outcome: Progressing   Problem: Metabolic: Goal: Ability to maintain appropriate glucose levels will improve Outcome: Progressing   Problem: Nutritional: Goal: Maintenance of adequate nutrition will improve Outcome: Progressing Goal: Progress toward achieving an optimal weight will improve Outcome: Progressing   Problem: Skin Integrity: Goal: Risk for impaired skin integrity will decrease Outcome: Progressing   Problem: Tissue Perfusion: Goal: Adequacy of tissue perfusion will improve Outcome: Progressing

## 2023-12-12 DIAGNOSIS — J45901 Unspecified asthma with (acute) exacerbation: Secondary | ICD-10-CM | POA: Diagnosis not present

## 2023-12-12 DIAGNOSIS — J122 Parainfluenza virus pneumonia: Secondary | ICD-10-CM | POA: Diagnosis not present

## 2023-12-12 DIAGNOSIS — K7460 Unspecified cirrhosis of liver: Secondary | ICD-10-CM | POA: Diagnosis not present

## 2023-12-12 DIAGNOSIS — R6520 Severe sepsis without septic shock: Secondary | ICD-10-CM | POA: Diagnosis not present

## 2023-12-12 DIAGNOSIS — I10 Essential (primary) hypertension: Secondary | ICD-10-CM | POA: Diagnosis not present

## 2023-12-12 DIAGNOSIS — F17210 Nicotine dependence, cigarettes, uncomplicated: Secondary | ICD-10-CM | POA: Diagnosis not present

## 2023-12-12 DIAGNOSIS — J9601 Acute respiratory failure with hypoxia: Secondary | ICD-10-CM | POA: Diagnosis not present

## 2023-12-12 DIAGNOSIS — A419 Sepsis, unspecified organism: Secondary | ICD-10-CM | POA: Diagnosis not present

## 2023-12-12 DIAGNOSIS — E119 Type 2 diabetes mellitus without complications: Secondary | ICD-10-CM | POA: Diagnosis not present

## 2023-12-12 LAB — BASIC METABOLIC PANEL WITH GFR
Anion gap: 9 (ref 5–15)
BUN: 12 mg/dL (ref 6–20)
CO2: 28 mmol/L (ref 22–32)
Calcium: 8.3 mg/dL — ABNORMAL LOW (ref 8.9–10.3)
Chloride: 100 mmol/L (ref 98–111)
Creatinine, Ser: 0.45 mg/dL (ref 0.44–1.00)
GFR, Estimated: >60 mL/min (ref >60)
Glucose, Bld: 135 mg/dL — ABNORMAL HIGH (ref 70–99)
Potassium: 4.1 mmol/L (ref 3.5–5.1)
Sodium: 137 mmol/L (ref 135–145)

## 2023-12-12 LAB — GLUCOSE, CAPILLARY
Glucose-Capillary: 148 mg/dL — ABNORMAL HIGH (ref 70–99)
Glucose-Capillary: 370 mg/dL — ABNORMAL HIGH (ref 70–99)
Glucose-Capillary: 428 mg/dL — ABNORMAL HIGH (ref 70–99)
Glucose-Capillary: 300 mg/dL — ABNORMAL HIGH (ref 70–99)

## 2023-12-12 LAB — PHOSPHORUS: Phosphorus: 3.8 mg/dL (ref 2.5–4.6)

## 2023-12-12 LAB — MAGNESIUM: Magnesium: 2.3 mg/dL (ref 1.7–2.4)

## 2023-12-12 MED ORDER — PREDNISONE 20 MG PO TABS
20.0000 mg | ORAL_TABLET | Freq: Every day | ORAL | Status: DC
  Administered 2023-12-13: 20 mg via ORAL
  Filled 2023-12-12: qty 1

## 2023-12-12 MED ORDER — PREDNISONE 20 MG PO TABS: 20.0000 mg | ORAL_TABLET | Freq: Every day | ORAL | Status: DC

## 2023-12-12 MED ORDER — INSULIN ASPART 100 UNIT/ML IJ SOLN
6.0000 [IU] | Freq: Three times a day (TID) | INTRAMUSCULAR | Status: DC
  Administered 2023-12-12 – 2023-12-13 (×2): 6 [IU] via SUBCUTANEOUS

## 2023-12-12 NOTE — Progress Notes (Signed)
 NAME:  Emily [name], MRN:  [id number], DOB:  April 21, 1981, LOS: 7 ADMISSION DATE:  12/04/2023, CONSULTATION DATE:  12/05/23 REFERRING MD:  Toribio Hummer, MD CHIEF COMPLAINT:  Pneumonia   History of Present Illness:  Emily [name] is a 43 year old woman, daily smoker with asthma, DMII, hypertension, hypothyroidism, bipolar disorder, cirrhosis and lupus who is admitted for acute hypoxemic respiratory failure due to pneumonia.   She reports 1 week of progressive dyspnea, cough and wheezing. She was treated with Zpak and prednisone  at an Urgent Care recently but her symptoms did not improve after being on therapy for 2 days.   She is followed by Dr. Geronimo in pulmonary clinic for asthma and lung nodules. Her asthma regimen includes advair diskus 250-50mcg 1 puff twice daily, montelukast  10mg  daily and as needed albuterol .   She is smoking 1 pack per day. She has history of positive ANA testing.  Pertinent  Medical History    has a past medical history of Abnormal CT of liver, Anxiety, Asthma, Asthma, Bipolar 1 disorder (HCC), Bipolar disorder (HCC), Chronic pain, Cirrhosis (HCC), Depression, Diabetes mellitus without complication (HCC), Fatty liver, Gallstones, GERD (gastroesophageal reflux disease), Headache(784.0), Hyperlipidemia, Hypertension, Hypothyroidism, Lupus (systemic lupus erythematosus) (HCC), Pulmonary nodule, Tachycardia, Thyroid disease, and Vitamin D  deficiency.   has a past surgical history that includes Dilation and curettage of uterus and Upper gi endoscopy (08/28/2022).  Significant Hospital Events: Including procedures, antibiotic start and stop dates in addition to other pertinent events   8/16 admitted for respiratory failure and pneumonia, Viral panel positive for parainfluenza virus 8/17  - She is tearful this morning and wanting to go home. Mother at bedside. She has been weaned to 8L O2 8/1: HFNC 12L . Afebrile. Pulse ox 88-91%. PAtient and mom at bedside. She  is very fearful of ending up oin ventilator and dying. She is in tears. Refused BiPAP but after counseling agreed to try on. Also feels beathin worse afte rIV steroids changed to po per patient and mom.  Mom reports multiple family members sick 8/18-8/20 solumedrol 125mg  TID, ESR 84 8/20 used BiPAP last night and day. On15L HHFNC. Feeling better. On IV Solumedrol since yesterday. Wanting antibiotics. Not interested in doxy. Off precedex  (needed for BiPAP 8/21 using nocturnal bipap, feeling better, remains off precedex , weaned to [place]ter HFNC 13L, good response to lasix   Interim History / Subjective:  No overnight events Denies any pain or discomfort Resting well at night BiPAP on at night but associated with coughing fits  Objective    Blood pressure 133/86, pulse 85, temperature 97.9 F (36.6 C), temperature source Oral, resp. rate (!) 25, height 5' 3.5 (1.613 m), weight 80.9 kg, last menstrual period 10/29/2023, SpO2 (!) 89%.    FiO2 (%):  [55 %] 55 % PEEP:  [5 cmH20] 5 cmH20   Intake/Output Summary (Last 24 hours) at 12/12/2023 0940 Last data filed at 12/12/2023 0800 Gross per 24 hour  Intake 1340 ml  Output 1900 ml  Net -560 ml    Filed Weights   12/10/23 1000 12/11/23 0537 12/12/23 0500  Weight: 79.8 kg 80.6 kg 80.9 kg    Examination: General: Pleasant young lady, does not appear to be in distress HEENT: Moist oral mucosa Neuro: Awake alert oriented, nonfocal exam CV: S1-S2 appreciated PULM: Clear breath sounds, does have some rales at the bases GI: Soft, bowel sounds appreciated Extremities: warm/dry, no pitting LE edema  Skin: no rashes   I reviewed last 24 h vitals and  pain scores, last 48 h intake and output, last 24 h labs and trends, and last 24 h imaging results. CT chest reviewed-interstitial infiltrates-reviewed by myself  Resolved problem list   Assessment and Plan   Acute hypoxemic respiratory failure Parainfluenza pneumonitis Viral pneumonia Acute  exacerbation of asthma Active smoker-committed to quitting Type 2 diabetes Cirrhosis History of lupus  Bipolar disorder  Completed course of antibiotics Pulmonary toileting Transition to oral steroids  Concern for sleep disordered breathing - Will need an in-lab sleep study - Noted to be a mouth breather  Hypertension Hypothyroidism  Will need oxygen supplementation as outpatient  May hold off on BiPAP tonight -May be reordered if patient feels it helps her sleep  Jennet Epley, MD  PCCM Pager: See Tracey

## 2023-12-12 NOTE — Inpatient Diabetes Management (Signed)
 Inpatient Diabetes Program Recommendations  AACE/ADA: New Consensus Statement on Inpatient Glycemic Control   Target Ranges:  Prepandial:   less than 140 mg/dL      Peak postprandial:   less than 180 mg/dL (1-2 hours)      Critically ill patients:  140 - 180 mg/dL    Latest Reference Range & Units 12/11/23 07:38 12/11/23 12:00 12/11/23 16:35 12/11/23 21:59 12/12/23 08:02  Glucose-Capillary 70 - 99 mg/dL 874 (H) 655 (H) 665 (H) 316 (H) 148 (H)   Review of Glycemic Control  Diabetes history: DM2 Outpatient Diabetes medications: Dexcom G7 CGM, Humalog  10 units TID with meals, Metformin  XR 500 mg BID (not taking) Current orders for Inpatient glycemic control: Semglee  40 units daily, Novolog  4 units TID with meals, Novolog  0-20 units TID with meals, Novolog  0-5 units at bedtime; Prednisone  20 mg QAM  Inpatient Diabetes Program Recommendations:    Insulin : Noted steroids changed from Solumedrol to Prednisone .  Please consider increasing meal coverage to Novolog  8 units TID with meals for meal coverage if patient eats at least 50% of meals.  Outpatient DM: At time of discharge, recommend to discharge on Lantus  40 units daily and Humalog  10 units TID with meals. Please provide Rx for Lantus  SoloStar pens [phone]), Humalog  Kwikpens [phone]), and insulin  pen needles [phone]).  Patient needs to follow up with Dr. Dartha (Endocrinologist) regarding DM management.  NOTE: Noted consult for Diabetes Coordinator. Diabetes Coordinator is not on campus over the weekend but available by pager from 8am to 5pm for questions or concerns. Per chart review noted patient sees [facility] and had video visit with Dr. Dartha on 09/21/23. Per note on 09/21/23, patient was asked to take Lantus  30-40 units QAM, Humalog  15 units TID with meals, and Metformin  XR 500 mg BID for DM control.  Noted patient message note on 09/24/23 that patient reported she was not tolerating Metformin ; patient was told it was ok to stop  the Metformin ; patient also noted that she wanted to take something besides insulin  as it was making her gain weight.   Thanks, Earnie Gainer, RN, MSN, CDCES Diabetes Coordinator Inpatient Diabetes Program [phone] (Team Pager from 8am to 5pm)

## 2023-12-12 NOTE — Plan of Care (Signed)
  Problem: Education: Goal: Knowledge of General Education information will improve Description: Including pain rating scale, medication(s)/side effects and non-pharmacologic comfort measures Outcome: Progressing   Problem: Clinical Measurements: Goal: Respiratory complications will improve Outcome: Progressing Goal: Cardiovascular complication will be avoided Outcome: Progressing   Problem: Nutrition: Goal: Adequate nutrition will be maintained Outcome: Progressing   Problem: Coping: Goal: Level of anxiety will decrease Outcome: Progressing   Problem: Pain Managment: Goal: General experience of comfort will improve and/or be controlled Outcome: Progressing   Problem: Activity: Goal: Ability to tolerate increased activity will improve Outcome: Progressing   Problem: Respiratory: Goal: Ability to maintain a clear airway will improve Outcome: Not Progressing

## 2023-12-12 NOTE — Progress Notes (Signed)
 PROGRESS NOTE  Emily [name]  FMW:[id number] DOB: 05-Sep-1980 DOA: 12/04/2023 PCP: [name], NP   Brief Narrative: Patient is a 43 year old female with history of asthma, chronic bronchitis/rhinitis, ongoing tobacco use, type diabetes, hypertension, hyperlipidemia, hypothyroidism, lupus, cirrhosis, bipolar disorder, anxiety, depression, GERD who presented with shortness of breath from home.  Chest x-ray was concerning for left lower lobe pneumonia.  On presentation, patient was in acute hypoxic respiratory failure requiring high flow oxygen, started on broad spectrum antibiotics.  PCCM consulted.  Had to be eventually transferred to ICU.  Prolonged hospitalization due to persistent hypoxic respiratory failure.  PCCM following.  Patient is clinically improving.  Currently on 5 L of oxygen per minute.  She may need oxygen on discharge.  Discharge planning in 2 to 3 days  Assessment & Plan:  Principal Problem:   Severe sepsis (HCC) Active Problems:   Acute respiratory failure with hypoxia (HCC)   Infection due to parainfluenza virus 3   TOBACCO ABUSE   Acute asthma exacerbation   G E REFLUX   Essential hypertension   Hypothyroidism   Type 2 diabetes mellitus (HCC)   Gastroesophageal reflux disease   Chronic bronchitis (HCC)   Generalized anxiety disorder   Major depressive disorder, recurrent severe without psychotic features (HCC)   Viral pneumonia  Severe sepsis secondary to parainfluenza 3 pneumonia: Presented with shortness of breath, tachypnea, leukocytosis.  Chest x-ray showed patchy left mid and lower lung opacities concerning for pneumonia.  Respiratory viral panel showed parainfluenza 3 virus.  Blood cultures have been negative so far.  Leukocytosis improved.  Currently on azithromycin .  Acute hypoxic respiratory failure: Secondary to pneumonia/parainfluenza virus infection.  History of asthma/COPD, was smoking a pack a day.  Might have underlying sleep apnea.  Needs  to go sleep study as an outpatient.  She required high flow oxygen on presentation.  Currently on BiPAP at night/on 5 L of oxygen via nasal cannula.  CT chest showed diffuse groundglass opacity throughout bilateral lungs with superimposed interlobular septal thickening compatible, superimposed areas of solid lung opacification bilaterally on dependent portion. Continue current bronchodilators, steroids ,also given a dose of Lasix  IV once.  Oxygen requirement slowly improving, but we are anticipating she may need oxygen on discharge.  Will check her ambulatory oxygenation tomorrow to check if she qualifies for home oxygen.  Acute exacerbation of asthma/chronic bronchitis versus acute COPD exacerbation/tobacco abuse: Likely accelerated by concurrent viral infection.  Had wheezing and rhonchi on presentation.  Currently on steroids.  She smokes a pack a day.  Counseled for cessation  Hypokalemia: Currently being monitored and supplemented as needed  Tobacco use: Continue current patch  Type 2 diabetes: Recent A1c of 9.6.  Takes short acting insulin , metformin  at home.  Currently hyperglycemic secondary to steroids.  Continue current insulin  regimen.   Diabetic monitor follow-up requested for possible change in the discharge regimen  Liver cirrhosis: No signs of ascites.  LFTs stable.   Hypothyroidism: Continue Synthyroid  Hyperlipidemia: Continue Crestor   Bipolar disorder/anxiety/depression: Klonopin   Hypertension: On Norvasc , propranolol   GERD: Continue PPI  SLE: We recommend outpatient follow-up.  Debility/deconditioning: PT recommending home with on discharge.  She is planning to stay with her mom on discharge for couple of weeks        DVT prophylaxis:enoxaparin  (LOVENOX ) injection 40 mg Start: 12/05/23 1000     Code Status: Full Code  Family Communication:   Patient status:Inpatient  Patient is from :Home  Anticipated discharge un:[facility]  Estimated DC date:  2 to 3 days,  needs to wean the oxygen before discharge   Consultants: PCCM  Procedures: None  Antimicrobials:  Anti-infectives (From admission, onward)    Start     Dose/Rate Route Frequency Ordered Stop   12/08/23 1400  azithromycin  (ZITHROMAX ) 500 mg in sodium chloride  0.9 % 250 mL IVPB        500 mg 250 mL/hr over 60 Minutes Intravenous Every 24 hours 12/08/23 1129     12/05/23 0100  cefTRIAXone  (ROCEPHIN ) 2 g in sodium chloride  0.9 % 100 mL IVPB  Status:  Discontinued        2 g 200 mL/hr over 30 Minutes Intravenous Every 24 hours 12/05/23 0040 12/05/23 1647   12/05/23 0045  cefTRIAXone  (ROCEPHIN ) 1 g in sodium chloride  0.9 % 100 mL IVPB  Status:  Discontinued        1 g 200 mL/hr over 30 Minutes Intravenous Every 24 hours 12/05/23 0034 12/05/23 0039   12/05/23 0045  azithromycin  (ZITHROMAX ) 500 mg in sodium chloride  0.9 % 250 mL IVPB  Status:  Discontinued        500 mg 250 mL/hr over 60 Minutes Intravenous Every 24 hours 12/05/23 0034 12/05/23 1647       Subjective: Patient seen and examined at bedside today.  Hemodynamically stable.  Very comfortable.  On 5 L of oxygen.  Used BiPAP at night.  Denied any shortness of breath or cough.  Very eager to go home.  Mother at bedside.  Objective: Vitals:   12/12/23 0455 12/12/23 0500 12/12/23 0600 12/12/23 0700  BP:  (!) 102/58 128/80 (!) 145/94  Pulse: 65 63 66 71  Resp:  17 19 20   Temp:      TempSrc:      SpO2: 96% 96% 96% 98%  Weight:  80.9 kg    Height:        Intake/Output Summary (Last 24 hours) at 12/12/2023 0738 Last data filed at 12/12/2023 0500 Gross per 24 hour  Intake 1580 ml  Output 1500 ml  Net 80 ml   Filed Weights   12/10/23 1000 12/11/23 0537 12/12/23 0500  Weight: 79.8 kg 80.6 kg 80.9 kg    Examination:  General exam: Overall comfortable, not in distress, obese HEENT: PERRL Respiratory system: Diminished sounds bilaterally on bases, no frank  wheezes or crackles  Cardiovascular system: S1 & S2 heard, RRR.   Gastrointestinal system: Abdomen is nondistended, soft and nontender. Central nervous system: Alert and oriented Extremities: No edema, no clubbing ,no cyanosis Skin: No rashes, no ulcers,no icterus     Data Reviewed: I have personally reviewed following labs and imaging studies  CBC: Recent Labs  Lab 12/06/23 0252 12/07/23 0254 12/08/23 0400 12/09/23 0332 12/10/23 0349  WBC 14.8* 16.5* 11.3* 11.3* 13.2*  NEUTROABS 11.6* 10.0* 8.6* 8.7*  --   HGB 13.6 13.9 13.1 13.3 13.2  HCT 41.1 40.8 38.2 40.0 38.8  MCV 101.0* 101.2* 100.5* 101.8* 103.2*  PLT 165 179 168 176 160   Basic Metabolic Panel: Recent Labs  Lab 12/06/23 0252 12/07/23 0254 12/08/23 0400 12/09/23 0332 12/10/23 0349 12/10/23 2222 12/11/23 0331 12/12/23 0253  NA 133* 138 135 137 137 132* 136 137  K 3.5 3.3* 4.1 3.5 3.7 3.5 3.2* 4.1  CL 99 101 101 100 98 93* 97* 100  CO2 25 26 23 27 28 27 29 28   GLUCOSE 335* 168* 277* 270* 238* 359* 143* 135*  BUN 9 10 16 20 17 16 13 12   CREATININE  0.42* 0.43* 0.54 0.41* 0.52 0.75 0.59 0.45  CALCIUM  8.4* 8.6* 8.4* 8.6* 8.8* 8.4* 8.5* 8.3*  MG 2.1 2.2 2.5* 2.4 2.3  --   --  2.3  PHOS 2.8 3.2 3.5  --  3.9  --   --  3.8     Recent Results (from the past 240 hours)  Resp panel by RT-PCR (RSV, Flu A&B, Covid) Anterior Nasal Swab     Status: None   Collection Time: 12/04/23 10:57 PM   Specimen: Anterior Nasal Swab  Result Value Ref Range Status   SARS Coronavirus 2 by RT PCR NEGATIVE NEGATIVE Final    Comment: (NOTE) SARS-CoV-2 target nucleic acids are NOT DETECTED.  The SARS-CoV-2 RNA is generally detectable in upper respiratory specimens during the acute phase of infection. The lowest concentration of SARS-CoV-2 viral copies this assay can detect is 138 copies/mL. A negative result does not preclude SARS-Cov-2 infection and should not be used as the sole basis for treatment or other patient management decisions. A negative result may occur with  improper specimen  collection/handling, submission of specimen other than nasopharyngeal swab, presence of viral mutation(s) within the areas targeted by this assay, and inadequate number of viral copies(<138 copies/mL). A negative result must be combined with clinical observations, patient history, and epidemiological information. The expected result is Negative.  Fact Sheet for Patients:  BloggerCourse.com  Fact Sheet for Healthcare Providers:  SeriousBroker.it  This test is no t yet approved or cleared by the [place]  FDA and  has been authorized for detection and/or diagnosis of SARS-CoV-2 by FDA under an Emergency Use Authorization (EUA). This EUA will remain  in effect (meaning this test can be used) for the duration of the COVID-19 declaration under Section 564(b)(1) of the Act, 21 U.S.C.section 360bbb-3(b)(1), unless the authorization is terminated  or revoked sooner.       Influenza A by PCR NEGATIVE NEGATIVE Final   Influenza B by PCR NEGATIVE NEGATIVE Final    Comment: (NOTE) The Xpert Xpress SARS-CoV-2/FLU/RSV plus assay is intended as an aid in the diagnosis of influenza from Nasopharyngeal swab specimens and should not be used as a sole basis for treatment. Nasal washings and aspirates are unacceptable for Xpert Xpress SARS-CoV-2/FLU/RSV testing.  Fact Sheet for Patients: BloggerCourse.com  Fact Sheet for Healthcare Providers: SeriousBroker.it  This test is not yet approved or cleared by the [place]  FDA and has been authorized for detection and/or diagnosis of SARS-CoV-2 by FDA under an Emergency Use Authorization (EUA). This EUA will remain in effect (meaning this test can be used) for the duration of the COVID-19 declaration under Section 564(b)(1) of the Act, 21 U.S.C. section 360bbb-3(b)(1), unless the authorization is terminated or revoked.     Resp Syncytial  Virus by PCR NEGATIVE NEGATIVE Final    Comment: (NOTE) Fact Sheet for Patients: BloggerCourse.com  Fact Sheet for Healthcare Providers: SeriousBroker.it  This test is not yet approved or cleared by the [place]  FDA and has been authorized for detection and/or diagnosis of SARS-CoV-2 by FDA under an Emergency Use Authorization (EUA). This EUA will remain in effect (meaning this test can be used) for the duration of the COVID-19 declaration under Section 564(b)(1) of the Act, 21 U.S.C. section 360bbb-3(b)(1), unless the authorization is terminated or revoked.  Performed at [facility], [address] [place], [place] [address]   MRSA Next Gen by PCR, Nasal     Status: None   Collection Time: 12/05/23  2:25 AM  Specimen: Nasal Mucosa; Nasal Swab  Result Value Ref Range Status   MRSA by PCR Next Gen NOT DETECTED NOT DETECTED Final    Comment: (NOTE) The GeneXpert MRSA Assay (FDA approved for NASAL specimens only), is one component of a comprehensive MRSA colonization surveillance program. It is not intended to diagnose MRSA infection nor to guide or monitor treatment for MRSA infections. Test performance is not FDA approved in patients less than 67 years old. Performed at [facility], [address], [place], [place] [address]   Culture, blood (Routine X 2) w Reflex to ID Panel     Status: None   Collection Time: 12/05/23  5:41 AM   Specimen: BLOOD LEFT ARM  Result Value Ref Range Status   Specimen Description   Final    BLOOD LEFT ARM Performed at [facility], [address], [place], [place] [address]    Special Requests   Final    BOTTLES DRAWN AEROBIC AND ANAEROBIC Blood Culture adequate volume Performed at [facility], [address] [place], [place] [address]    Culture   Final    NO GROWTH 5 DAYS Performed at [facility], [address] [place], [place] [address]    Report Status 12/10/2023 FINAL  Final  Culture, blood (Routine X 2) w Reflex to ID Panel     Status: None   Collection Time: 12/05/23  5:41 AM   Specimen: BLOOD LEFT ARM  Result Value Ref Range Status   Specimen Description   Final    BLOOD LEFT ARM Performed at [facility], [address] [place], [place] [address]    Special Requests   Final    BOTTLES DRAWN AEROBIC AND ANAEROBIC Blood Culture adequate volume Performed at [facility], [address], [place], [place] [address]    Culture   Final    NO GROWTH 5 DAYS Performed at [facility], [address], [place], [place] [address]    Report Status 12/10/2023 FINAL  Final  Expectorated Sputum Assessment w Gram Stain, Rflx to Resp Cult     Status: None   Collection Time: 12/05/23  1:29 PM   Specimen: Sputum  Result Value Ref Range Status   Specimen Description SPUTUM  Final   Special Requests NONE  Final   Sputum evaluation   Final    Sputum specimen not acceptable for testing.  Please recollect.   NOTIFIED H. SCHULTZ,RN ON 12/05/2023 AT 1347 BY SL Performed at [facility], [address], [place], [place] [address]    Report Status 12/05/2023 FINAL  Final  Respiratory (~20 pathogens) panel by PCR     Status: Abnormal   Collection Time: 12/05/23  1:29 PM   Specimen: Nasopharyngeal Swab; Respiratory  Result Value Ref Range Status   Adenovirus NOT DETECTED NOT DETECTED Final   Coronavirus 229E NOT DETECTED NOT DETECTED Final    Comment: (NOTE) The Coronavirus on the Respiratory Panel, DOES NOT test for the novel  Coronavirus (2019 nCoV)    Coronavirus HKU1 NOT DETECTED NOT DETECTED Final   Coronavirus NL63 NOT DETECTED NOT DETECTED Final   Coronavirus OC43 NOT DETECTED NOT DETECTED Final   Metapneumovirus NOT DETECTED NOT DETECTED Final   Rhinovirus / Enterovirus NOT DETECTED NOT DETECTED Final   Influenza A NOT DETECTED NOT DETECTED  Final   Influenza B NOT DETECTED NOT DETECTED Final   Parainfluenza Virus 1 NOT  DETECTED NOT DETECTED Final   Parainfluenza Virus 2 NOT DETECTED NOT DETECTED Final   Parainfluenza Virus 3 DETECTED (A) NOT DETECTED Final   Parainfluenza Virus 4 NOT DETECTED NOT DETECTED Final   Respiratory Syncytial Virus NOT DETECTED NOT DETECTED Final   Bordetella pertussis NOT DETECTED NOT DETECTED Final   Bordetella Parapertussis NOT DETECTED NOT DETECTED Final   Chlamydophila pneumoniae NOT DETECTED NOT DETECTED Final   Mycoplasma pneumoniae NOT DETECTED NOT DETECTED Final    Comment: Performed at [facility], [address] [place], [place] [address]     Radiology Studies: DG Chest Port 1 View Result Date: 12/11/2023 CLINICAL DATA:  Follow-up pneumonia EXAM: PORTABLE CHEST 1 VIEW COMPARISON:  12/10/2023 FINDINGS: Artifact overlies the chest. Widespread hazy/patchy infiltrates are slightly improved since yesterday. No worsening or new finding. No evidence of lobar collapse or pleural effusion. IMPRESSION: Slight improvement in widespread hazy/patchy infiltrates since yesterday. Electronically Signed   By: Oneil Officer M.D.   On: 12/11/2023 10:28    Scheduled Meds:  amLODipine   10 mg Oral Daily   ascorbic acid   500 mg Oral Daily   budesonide  (PULMICORT ) nebulizer solution  0.5 mg Nebulization BID   Chlorhexidine  Gluconate Cloth  6 each Topical Daily   cyanocobalamin   1,000 mcg Intramuscular Q1200   Followed by   NOREEN ON 12/16/2023] vitamin B-12  1,000 mcg Oral Daily   enoxaparin  (LOVENOX ) injection  40 mg Subcutaneous Q24H   famotidine   20 mg Oral BID   fluticasone   2 spray Each Nare Daily   folic acid   1 mg Oral Daily   guaiFENesin   1,200 mg Oral BID   insulin  aspart  0-20 Units Subcutaneous TID WC   insulin  aspart  0-5 Units Subcutaneous QHS   insulin  aspart  4 Units Subcutaneous TID WC   insulin  glargine-yfgn  40 Units Subcutaneous Daily   ipratropium-albuterol   3 mL Nebulization  Q4H   lamoTRIgine   150 mg Oral Daily   levothyroxine   75 mcg Oral Q0600   loratadine   10 mg Oral Daily   methylPREDNISolone  (SOLU-MEDROL ) injection  40 mg Intravenous Daily   nicotine   21 mg Transdermal Daily   mouth rinse  15 mL Mouth Rinse 4 times per day   pantoprazole   40 mg Oral Daily   PARoxetine   10 mg Oral QHS   propranolol   20 mg Oral TID   rosuvastatin   5 mg Oral Daily   ziprasidone   60 mg Oral QHS   Continuous Infusions:  azithromycin  Stopped (12/11/23 1517)     LOS: 7 days   Ivonne Mustache, MD [facility] P8/23/2025, 7:38 AM

## 2023-12-13 DIAGNOSIS — E119 Type 2 diabetes mellitus without complications: Secondary | ICD-10-CM | POA: Diagnosis not present

## 2023-12-13 DIAGNOSIS — K7460 Unspecified cirrhosis of liver: Secondary | ICD-10-CM | POA: Diagnosis not present

## 2023-12-13 DIAGNOSIS — R6520 Severe sepsis without septic shock: Secondary | ICD-10-CM | POA: Diagnosis not present

## 2023-12-13 DIAGNOSIS — B348 Other viral infections of unspecified site: Secondary | ICD-10-CM | POA: Diagnosis not present

## 2023-12-13 DIAGNOSIS — J129 Viral pneumonia, unspecified: Secondary | ICD-10-CM | POA: Diagnosis not present

## 2023-12-13 DIAGNOSIS — F17210 Nicotine dependence, cigarettes, uncomplicated: Secondary | ICD-10-CM | POA: Diagnosis not present

## 2023-12-13 DIAGNOSIS — I10 Essential (primary) hypertension: Secondary | ICD-10-CM | POA: Diagnosis not present

## 2023-12-13 DIAGNOSIS — J45901 Unspecified asthma with (acute) exacerbation: Secondary | ICD-10-CM | POA: Diagnosis not present

## 2023-12-13 DIAGNOSIS — J122 Parainfluenza virus pneumonia: Secondary | ICD-10-CM | POA: Diagnosis not present

## 2023-12-13 DIAGNOSIS — J9601 Acute respiratory failure with hypoxia: Secondary | ICD-10-CM | POA: Diagnosis not present

## 2023-12-13 DIAGNOSIS — A419 Sepsis, unspecified organism: Secondary | ICD-10-CM | POA: Diagnosis not present

## 2023-12-13 LAB — CBC
HCT: 38.1 % (ref 36.0–46.0)
Hemoglobin: 12.9 g/dL (ref 12.0–15.0)
MCH: 34.9 pg — ABNORMAL HIGH (ref 26.0–34.0)
MCHC: 33.9 g/dL (ref 30.0–36.0)
MCV: 103 fL — ABNORMAL HIGH (ref 80.0–100.0)
Platelets: 152 K/uL (ref 150–400)
RBC: 3.7 MIL/uL — ABNORMAL LOW (ref 3.87–5.11)
RDW: 14.8 % (ref 11.5–15.5)
WBC: 15.1 K/uL — ABNORMAL HIGH (ref 4.0–10.5)
nRBC: 0 % (ref 0.0–0.2)

## 2023-12-13 LAB — BASIC METABOLIC PANEL WITH GFR
Anion gap: 11 (ref 5–15)
BUN: 11 mg/dL (ref 6–20)
CO2: 29 mmol/L (ref 22–32)
Calcium: 8.9 mg/dL (ref 8.9–10.3)
Chloride: 98 mmol/L (ref 98–111)
Creatinine, Ser: 0.46 mg/dL (ref 0.44–1.00)
GFR, Estimated: >60 mL/min
Glucose, Bld: 124 mg/dL — ABNORMAL HIGH (ref 70–99)
Potassium: 3.8 mmol/L (ref 3.5–5.1)
Sodium: 138 mmol/L (ref 135–145)

## 2023-12-13 LAB — GLUCOSE, CAPILLARY: Glucose-Capillary: 148 mg/dL — ABNORMAL HIGH (ref 70–99)

## 2023-12-13 MED ORDER — CYANOCOBALAMIN 1000 MCG PO TABS: 1000.0000 ug | ORAL_TABLET | Freq: Every day | ORAL | 0 refills | Status: DC

## 2023-12-13 MED ORDER — AMLODIPINE BESYLATE 10 MG PO TABS: 10.0000 mg | ORAL_TABLET | Freq: Every day | ORAL | 0 refills | Status: DC

## 2023-12-13 MED ORDER — GUAIFENESIN ER 600 MG PO TB12: 1200.0000 mg | ORAL_TABLET | Freq: Two times a day (BID) | ORAL | 0 refills | Status: AC

## 2023-12-13 MED ORDER — NICOTINE 21 MG/24HR TD PT24: 21.0000 mg | MEDICATED_PATCH | Freq: Every day | TRANSDERMAL | 0 refills | Status: AC

## 2023-12-13 MED ORDER — LANTUS SOLOSTAR 100 UNIT/ML ~~LOC~~ SOPN: 40.0000 [IU] | PEN_INJECTOR | Freq: Every day | SUBCUTANEOUS | 0 refills | Status: DC

## 2023-12-13 MED ORDER — FOLIC ACID 1 MG PO TABS: 1.0000 mg | ORAL_TABLET | Freq: Every day | ORAL | 0 refills | Status: AC

## 2023-12-13 MED ORDER — INSULIN LISPRO (1 UNIT DIAL) 100 UNIT/ML (KWIKPEN): 12.0000 [IU] | PEN_INJECTOR | Freq: Three times a day (TID) | SUBCUTANEOUS | 0 refills | Status: DC

## 2023-12-13 MED ORDER — INSULIN PEN NEEDLE 32G X 4 MM MISC: 1.0000 | Freq: Four times a day (QID) | 0 refills | Status: DC

## 2023-12-13 MED ORDER — PREDNISONE 20 MG PO TABS: 20.0000 mg | ORAL_TABLET | Freq: Every day | ORAL | 0 refills | Status: AC

## 2023-12-13 NOTE — Discharge Summary (Signed)
 Physician Discharge Summary  Emily [name] FMW:[id number] DOB: 18-Jul-1980 DOA: 12/04/2023  PCP: [name], NP  Admit date: 12/04/2023 Discharge date: 12/13/2023  Admitted From: Home Disposition:  Home  Discharge Condition:Stable CODE STATUS:FULL Diet recommendation: Carb Modified   Brief/Interim Summary: Patient is a 43 year old female with history of asthma, chronic bronchitis/rhinitis, ongoing tobacco use, type diabetes, hypertension, hyperlipidemia, hypothyroidism, lupus, cirrhosis, bipolar disorder, anxiety, depression, GERD who presented with shortness of breath from home.  Chest x-ray was concerning for left lower lobe pneumonia.  On presentation, patient was in acute hypoxic respiratory failure requiring high flow oxygen, started on broad spectrum antibiotics.  PCCM consulted.  Had to be eventually transferred to ICU.  Prolonged hospitalization due to persistent hypoxic respiratory failure.  PCCM following.  Patient is clinically improving.  Currently on 2 to 3 days of oxygen per minute.  She qualified for home oxygen.  She is very eager to go home today.  Medically stable for discharge to home.  She will follow-up with her pulmonologist as an outpatient.  Following problems were addressed during the hospitalization:  Severe sepsis secondary to parainfluenza 3 pneumonia: Presented with shortness of breath, tachypnea, leukocytosis.  Chest x-ray showed patchy left mid and lower lung opacities concerning for pneumonia.  Respiratory viral panel showed parainfluenza 3 virus.  Blood cultures have been negative so far.  Leukocytosis improved.  She finished course of antibiotics   Acute hypoxic respiratory failure: Secondary to pneumonia/parainfluenza virus infection.  History of asthma/COPD, was smoking a pack a day.  Might have underlying sleep apnea.  Needs to go sleep study as an outpatient.  She required high flow oxygen on presentation.  Currently on BiPAP at night/on 5 L of  oxygen via nasal cannula.  CT chest showed diffuse groundglass opacity throughout bilateral lungs with superimposed interlobular septal thickening compatible, superimposed areas of solid lung opacification bilaterally on dependent portion. She qualified for home oxygen oxygen.  She will follow-up with pulmonology as an outpatient.  Denied any shortness of breath or cough today.  Acute exacerbation of asthma/chronic bronchitis versus acute COPD exacerbation/tobacco abuse: Likely accelerated by concurrent viral infection.  Had wheezing and rhonchi on presentation. Finish the  remaining course of prednisone .  She smokes a pack a day.  Counseled for cessation.  Continue nicotine  patch   Hypokalemia: Supplemented and corrected   Type 2 diabetes: Recent A1c of 9.6.  Takes short acting insulin , metformin  at home.  Currently hyperglycemic secondary to steroids.  Being discharged with insulin  as per diabetic coordinator's recommendation.  She will follow-up with her endocrinologist as an outpatient  Liver cirrhosis: No signs of ascites.  LFTs stable.    Hypothyroidism: Continue Synthyroid   Hyperlipidemia: Was taking crestor  at home Crestor    Bipolar disorder/anxiety/depression:On  Klonopin    Hypertension: On Norvasc , propranolol    GERD: Continue PPI   SLE: We recommend outpatient follow-up.   Debility/deconditioning: PT recommending home with on discharge.  She is planning to stay with her mom on discharge for couple of weeks   Discharge Diagnoses:  Principal Problem:   Severe sepsis (HCC) Active Problems:   Acute respiratory failure with hypoxia (HCC)   Infection due to parainfluenza virus 3   TOBACCO ABUSE   Acute asthma exacerbation   G E REFLUX   Essential hypertension   Hypothyroidism   Type 2 diabetes mellitus (HCC)   Gastroesophageal reflux disease   Chronic bronchitis (HCC)   Generalized anxiety disorder   Major depressive disorder, recurrent severe without psychotic features  (  HCC)   Viral pneumonia    Discharge Instructions  Discharge Instructions     Diet Carb Modified   Complete by: As directed    Discharge instructions   Complete by: As directed    1)Please take prescribed medications as instructed 2)Monitor your blood sugars at home 3)Follow up with your PCP in a week 4)Follow up with your endocrinologist and pulmonologist as an outpatient   Increase activity slowly   Complete by: As directed       Allergies as of 12/13/2023       Reactions   Bactrim  [sulfamethoxazole -trimethoprim ] Anaphylaxis, Shortness Of Breath, Other (See Comments)   Chest pain, trouble breathing   Ciprofloxacin Palpitations   Clindamycin /lincomycin Other (See Comments)   Chest Pain   Flagyl  [metronidazole ] Shortness Of Breath   Hydrocodone Nausea Only   Iodinated Contrast Media Anaphylaxis, Swelling, Other (See Comments)   Body swells, too   Ondansetron Shortness Of Breath   Liraglutide  Nausea And Vomiting, Other (See Comments)   Victoza    Doxycycline  Nausea And Vomiting   Metformin  And Related Other (See Comments)   Tore up my stomach   Penicillins Rash   Synjardy  Xr [empagliflozin-metformin  Hcl Er] Rash, Other (See Comments)   Diaper rash        Medication List     STOP taking these medications    azithromycin  250 MG tablet Commonly known as: ZITHROMAX    cyclobenzaprine  10 MG tablet Commonly known as: FLEXERIL    fluticasone  50 MCG/ACT nasal spray Commonly known as: FLONASE    metFORMIN  500 MG 24 hr tablet Commonly known as: GLUCOPHAGE -XR   naproxen  500 MG tablet Commonly known as: Naprosyn    nystatin  cream Commonly known as: MYCOSTATIN    prochlorperazine  10 MG tablet Commonly known as: COMPAZINE    promethazine  12.5 MG tablet Commonly known as: PHENERGAN    pseudoephedrine  120 MG 12 hr tablet Commonly known as: SUDAFED       TAKE these medications    Accu-Chek Aviva Plus test strip Generic drug: glucose blood 1 each by Other  route 3 (three) times daily.   Accu-Chek Aviva Plus w/Device Kit USE AS DIRECTED   Blood Glucose Monitoring Suppl Devi 1 each by Does not apply route in the morning, at noon, and at bedtime. May substitute to any manufacturer covered by patient's insurance.   Accu-Chek FastClix Lancets Misc USE AS DIRECTED   albuterol  108 (90 Base) MCG/ACT inhaler Commonly known as: VENTOLIN  HFA Inhale 2 puffs into the lungs every 6 (six) hours as needed for wheezing or shortness of breath.   amLODipine  10 MG tablet Commonly known as: NORVASC  Take 1 tablet (10 mg total) by mouth daily. Start taking on: December 14, 2023 What changed:  medication strength how much to take   benzonatate  100 MG capsule Commonly known as: TESSALON  Take 1 capsule (100 mg total) by mouth 3 (three) times daily as needed for cough.   clonazePAM  1 MG tablet Commonly known as: KLONOPIN  Take 1 mg by mouth 2 (two) times daily as needed for anxiety.   Compressor/Nebulizer Misc Use as directed   cyanocobalamin  1000 MCG tablet Take 1 tablet (1,000 mcg total) by mouth daily. Start taking on: December 16, 2023   Dexcom G7 Receiver Devi 1 Device by Does not apply route continuous.   Dexcom G7 Sensor Misc USE AS DIRECTED   famotidine  20 MG tablet Commonly known as: PEPCID  Take 20 mg by mouth 2 (two) times daily.   fluticasone -salmeterol 250-50 MCG/ACT Aepb Commonly known as: ADVAIR Inhale  1 puff into the lungs every 12 (twelve) hours. Rinse after use.   folic acid  1 MG tablet Commonly known as: FOLVITE  Take 1 tablet (1 mg total) by mouth daily. Start taking on: December 14, 2023   guaiFENesin  600 MG 12 hr tablet Commonly known as: MUCINEX  Take 2 tablets (1,200 mg total) by mouth 2 (two) times daily for 7 days.   insulin  lispro 100 UNIT/ML KwikPen Commonly known as: HumaLOG  KwikPen Inject 12 Units into the skin 3 (three) times daily. What changed:  how much to take how to take this when to take  this additional instructions   Insulin  Pen Needle 32G X 4 MM Misc 1 Device by Does not apply route in the morning, at noon, in the evening, and at bedtime.   ipratropium 0.03 % nasal spray Commonly known as: ATROVENT  Place 2 sprays into both nostrils every 12 (twelve) hours.   ipratropium-albuterol  0.5-2.5 (3) MG/3ML Soln Commonly known as: DUONEB Take 3 mLs by nebulization in the morning, at noon, and at bedtime.   lamoTRIgine  150 MG tablet Commonly known as: LAMICTAL  Take 150 mg by mouth at bedtime.   Lantus  SoloStar 100 UNIT/ML Solostar Pen Generic drug: insulin  glargine Inject 40 Units into the skin daily.   levothyroxine  75 MCG tablet Commonly known as: SYNTHROID  TAKE 1 TABLET BY MOUTH EVERY DAY BEFORE BREAKFAST What changed: See the new instructions.   montelukast  10 MG tablet Commonly known as: SINGULAIR  TAKE 1 TABLET BY MOUTH EVERYDAY AT BEDTIME What changed: See the new instructions.   Nebulizer/Tubing/Mouthpiece Kit Use as directed   nicotine  21 mg/24hr patch Commonly known as: NICODERM CQ  - dosed in mg/24 hours Place 1 patch (21 mg total) onto the skin daily. Start taking on: December 14, 2023   nitroGLYCERIN  0.4 MG SL tablet Commonly known as: NITROSTAT  Place 1 tablet (0.4 mg total) under the tongue every 5 (five) minutes as needed for chest pain.   PARoxetine  10 MG tablet Commonly known as: PAXIL  Take 10 mg by mouth in the morning.   predniSONE  20 MG tablet Commonly known as: DELTASONE  Take 1 tablet (20 mg total) by mouth daily with breakfast for 2 days. Start taking on: December 14, 2023   promethazine -dextromethorphan  6.25-15 MG/5ML syrup Commonly known as: PROMETHAZINE -DM Take 5 mLs by mouth 4 (four) times daily as needed. What changed: reasons to take this   propranolol  20 MG tablet Commonly known as: INDERAL  Take 1 tablet (20 mg total) by mouth 3 (three) times daily. What changed: when to take this   rosuvastatin  5 MG tablet Commonly known  as: Crestor  Take 1 tablet (5 mg total) by mouth daily.   solifenacin  5 MG tablet Commonly known as: VESICARE  Take 1 tablet (5 mg total) by mouth daily.   TYLENOL  500 MG tablet Generic drug: acetaminophen  Take 500-1,000 mg by mouth every 6 (six) hours as needed for mild pain (pain score 1-3) or headache.   Vitamin D  (Ergocalciferol ) 1.25 MG (50000 UNIT) Caps capsule Commonly known as: DRISDOL  Take 50,000 Units by mouth every Wednesday.   ziprasidone  60 MG capsule Commonly known as: GEODON  Take 60 mg by mouth at bedtime.               Durable Medical Equipment  (From admission, onward)           Start     Ordered   12/13/23 0817  For home use only DME oxygen  Once       Question Answer Comment  Length of Need Lifetime   Mode or (Route) Nasal cannula   Liters per Minute 4   Frequency Continuous (stationary and portable oxygen unit needed)   Oxygen delivery system Gas      12/13/23 0816            Follow-up Information     [facility] Follow up.   Contact information: [facility]) [address], [address] [place], [place] [address] [phone]        [name], NP. Schedule an appointment as soon as possible for a visit in 1 week(s).   Specialty: Family Medicine Contact information: [address] [place] [address] [phone]         Dartha Ernst, MD. Schedule an appointment as soon as possible for a visit in 1 week(s).   Specialty: Endocrinology Contact information: [address] [place] [address] [phone]                Allergies  Allergen Reactions   Bactrim  [Sulfamethoxazole -Trimethoprim ] Anaphylaxis, Shortness Of Breath and Other (See Comments)    Chest pain, trouble breathing   Ciprofloxacin Palpitations   Clindamycin /Lincomycin Other (See Comments)    Chest Pain   Flagyl  [Metronidazole ] Shortness Of Breath   Hydrocodone Nausea Only   Iodinated Contrast Media Anaphylaxis,  Swelling and Other (See Comments)    Body swells, too   Ondansetron Shortness Of Breath   Liraglutide  Nausea And Vomiting and Other (See Comments)    Victoza    Doxycycline  Nausea And Vomiting   Metformin  And Related Other (See Comments)    Tore up my stomach   Penicillins Rash   Synjardy  Xr [Empagliflozin-Metformin  Hcl Er] Rash and Other (See Comments)    Diaper rash    Consultations: None   Procedures/Studies: DG Chest Port 1 View Result Date: 12/11/2023 CLINICAL DATA:  Follow-up pneumonia EXAM: PORTABLE CHEST 1 VIEW COMPARISON:  12/10/2023 FINDINGS: Artifact overlies the chest. Widespread hazy/patchy infiltrates are slightly improved since yesterday. No worsening or new finding. No evidence of lobar collapse or pleural effusion. IMPRESSION: Slight improvement in widespread hazy/patchy infiltrates since yesterday. Electronically Signed   By: Oneil Officer M.D.   On: 12/11/2023 10:28   DG Chest Port 1 View Result Date: 12/10/2023 CLINICAL DATA:  Respiratory failure.  Pneumonia. EXAM: PORTABLE CHEST 1 VIEW COMPARISON:  12/08/2023 FINDINGS: The cardio pericardial silhouette is enlarged. Interval progression of diffuse bilateral airspace disease, now more prominent in the upper lungs. No substantial pleural effusion. No acute bony abnormality. Telemetry leads overlie the chest. IMPRESSION: Interval progression of diffuse bilateral airspace disease, now more prominent in the upper lungs. Findings compatible with diffuse infection or edema. Electronically Signed   By: Camellia Candle M.D.   On: 12/10/2023 08:32   DG CHEST PORT 1 VIEW Result Date: 12/08/2023 EXAM: 1 VIEW XRAY OF THE CHEST 12/08/2023 10:06:00 AM COMPARISON: Plain film and CT of yesterday. CLINICAL HISTORY: [id number] Acute respiratory failure with hypoxia (HCC) [id number]. Acute respiratory failure with hypoxia; unable to remove bra due to multiple lines. Acute respiratory failure with hypoxia; unable to remove bra due to multiple lines.  FINDINGS: LUNGS AND PLEURA: Lower lung predominant, bilateral interstitial and airspace disease is minimally improved. Primary area of improvement is the upper lobes. No new pulmonary opacity. No pleural effusion. No pneumothorax. HEART AND MEDIASTINUM: No acute abnormality of the cardiac and mediastinal silhouettes. BONES AND SOFT TISSUES: No acute osseous abnormality. Multiple wires and leads project over the chest on the frontal  radiograph. IMPRESSION: 1. Lower lung predominant, bilateral interstitial and airspace disease, minimally improved, with primary improvement in the upper lobes. No new pulmonary opacity. Electronically signed by: Rockey Kilts MD 12/08/2023 11:03 AM EDT RP Workstation: [id number]   CT CHEST WO CONTRAST Result Date: 12/07/2023 CLINICAL DATA:  Respiratory illness, nondiagnostic xray ? ARDS. EXAM: CT CHEST WITHOUT CONTRAST TECHNIQUE: Multidetector CT imaging of the chest was performed following the standard protocol without IV contrast. RADIATION DOSE REDUCTION: This exam was performed according to the departmental dose-optimization program which includes automated exposure control, adjustment of the mA and/or kV according to patient size and/or use of iterative reconstruction technique. COMPARISON:  CT scan chest from 08/03/2023. FINDINGS: Cardiovascular: Normal cardiac size. No pericardial effusion. No aortic aneurysm. There are coronary artery calcifications, in keeping with coronary artery disease. Mediastinum/Nodes: Visualized thyroid gland appears grossly unremarkable. No solid / cystic mediastinal masses. The esophagus is nondistended precluding optimal assessment. Redemonstration of multiple mildly enlarged mediastinal lymph nodes with largest in the precarinal location measuring up to 1.3 x 2.4 cm. These are indeterminate in etiology but appears grossly unchanged since the prior study. No axillary lymphadenopathy by size criteria. Evaluation of bilateral hila is limited due to lack  on intravenous contrast: however, no large hilar lymphadenopathy identified. Lungs/Pleura: The central tracheo-bronchial tree is patent. There are diffuse ground-glass opacities throughout bilateral lungs with superimposed interlobular and interlobular septal thickening, compatible with crazy paving pattern. There are also associated superimposed areas of solid lung opacification bilaterally more in the dependent portions. This is nonspecific and differential diagnosis includes ARDS, multilobar pneumonia, pulmonary alveolar proteinosis, pulmonary edema, etc. Correlate clinically. No suspicious mass. No pleural effusion or pneumothorax. Evaluation for discrete lung nodule is limited due to extensive background lung parenchymal opacities. Upper Abdomen: Visualized upper abdominal viscera within normal limits. Musculoskeletal: There is an oval approximately 8 x 30 mm right retroareolar soft tissue nodule, incompletely characterized on the CT scan exam but grossly similar to the prior study. There is also an additional approximately 8 x 16 mm soft tissue attenuation nodule centered in the skin/subcutaneous tissue over the right lower anterior chest wall (series 2, image 106), incompletely characterized on the current exam but favored to represent epidermal inclusion cyst. Correlate with physical examination. The visualized soft tissues of the chest wall are otherwise grossly unremarkable. No suspicious osseous lesions. IMPRESSION: 1. There are diffuse ground-glass opacities throughout bilateral lungs with superimposed interlobular and interlobular septal thickening, compatible with crazy paving pattern. There are also associated superimposed areas of solid lung opacification bilaterally more in the dependent portions. This is nonspecific and differential diagnosis includes ARDS, multilobar pneumonia, pulmonary alveolar proteinosis, pulmonary edema, etc. 2. Multiple other nonacute observations, as described above. Aortic  Atherosclerosis (ICD10-I70.0). Electronically Signed   By: Ree Molt M.D.   On: 12/07/2023 11:34   DG CHEST PORT 1 VIEW Result Date: 12/07/2023 CLINICAL DATA:  Acute respiratory failure EXAM: PORTABLE CHEST 1 VIEW COMPARISON:  12/06/2023 FINDINGS: Cardiac shadow is within normal limits. Increasing airspace opacity is noted bilaterally. No sizable effusion is noted. No bony abnormality is seen. IMPRESSION: Worsening airspace opacity bilaterally. Electronically Signed   By: Oneil Devonshire M.D.   On: 12/07/2023 10:43   DG CHEST PORT 1 VIEW Result Date: 12/06/2023 EXAM: 1 VIEW XRAY OF THE CHEST 12/06/2023 08:09:03 AM COMPARISON: None available. CLINICAL HISTORY: Hypoxia, patient with parainfluenza virus; Pt is a smoker, hx of Asthma. FINDINGS: LUNGS AND PLEURA: Patchy interstitial airspace opacities are superimposed on chronic changes of COPD.  Findings are consistent with a viral pneumonitis. No pleural effusion. No pneumothorax. HEART AND MEDIASTINUM: No acute abnormality of the cardiac and mediastinal silhouettes. BONES AND SOFT TISSUES: No acute osseous abnormality. IMPRESSION: 1. Findings consistent with viral pneumonitis, superimposed on chronic changes of COPD. Electronically signed by: Lonni Necessary MD 12/06/2023 11:35 AM EDT RP Workstation: [id number]   DG Chest Portable 1 View Result Date: 12/05/2023 CLINICAL DATA:  Chest pain EXAM: PORTABLE CHEST 1 VIEW COMPARISON:  11/03/2023 FINDINGS: Heart and mediastinal contours within normal limits. Right lung clear. Patchy left mid and lower lung opacities could reflect early infiltrate. No effusions. No acute bony abnormality. IMPRESSION: Patchy left mid and lower lung opacities concerning for pneumonia. Electronically Signed   By: Franky Crease M.D.   On: 12/05/2023 00:14      Subjective: Patient seen and examined at bedside today.  Very comfortable.  She was sitting on the chair.  On 2 to 3 L of oxygen per minute.  Denies shortness of breath or  cough.  Very eager to go home today.  Discharge Exam: Vitals:   12/13/23 0849 12/13/23 0900  BP:  111/73  Pulse:  83  Resp:  18  Temp: 97.8 F (36.6 C)   SpO2:  93%   Vitals:   12/13/23 0800 12/13/23 0822 12/13/23 0849 12/13/23 0900  BP: (!) 122/90   111/73  Pulse: 85 80  83  Resp: 17 19  18   Temp:   97.8 F (36.6 C)   TempSrc:   Oral   SpO2: 92% (!) 89%  93%  Weight:      Height:        General: Pt is alert, awake, not in acute distress, obese Cardiovascular: RRR, S1/S2 +, no rubs, no gallops Respiratory: CTA bilaterally, no wheezing, no rhonchi, diminished air sounds on bilateral bases Abdominal: Soft, NT, ND, bowel sounds + Extremities: no edema, no cyanosis    The results of significant diagnostics from this hospitalization (including imaging, microbiology, ancillary and laboratory) are listed below for reference.     Microbiology: Recent Results (from the past 240 hours)  Resp panel by RT-PCR (RSV, Flu A&B, Covid) Anterior Nasal Swab     Status: None   Collection Time: 12/04/23 10:57 PM   Specimen: Anterior Nasal Swab  Result Value Ref Range Status   SARS Coronavirus 2 by RT PCR NEGATIVE NEGATIVE Final    Comment: (NOTE) SARS-CoV-2 target nucleic acids are NOT DETECTED.  The SARS-CoV-2 RNA is generally detectable in upper respiratory specimens during the acute phase of infection. The lowest concentration of SARS-CoV-2 viral copies this assay can detect is 138 copies/mL. A negative result does not preclude SARS-Cov-2 infection and should not be used as the sole basis for treatment or other patient management decisions. A negative result may occur with  improper specimen collection/handling, submission of specimen other than nasopharyngeal swab, presence of viral mutation(s) within the areas targeted by this assay, and inadequate number of viral copies(<138 copies/mL). A negative result must be combined with clinical observations, patient history, and  epidemiological information. The expected result is Negative.  Fact Sheet for Patients:  BloggerCourse.com  Fact Sheet for Healthcare Providers:  SeriousBroker.it  This test is no t yet approved or cleared by the [place]  FDA and  has been authorized for detection and/or diagnosis of SARS-CoV-2 by FDA under an Emergency Use Authorization (EUA). This EUA will remain  in effect (meaning this test can be used) for the duration of the COVID-19 declaration  under Section 564(b)(1) of the Act, 21 U.S.C.section 360bbb-3(b)(1), unless the authorization is terminated  or revoked sooner.       Influenza A by PCR NEGATIVE NEGATIVE Final   Influenza B by PCR NEGATIVE NEGATIVE Final    Comment: (NOTE) The Xpert Xpress SARS-CoV-2/FLU/RSV plus assay is intended as an aid in the diagnosis of influenza from Nasopharyngeal swab specimens and should not be used as a sole basis for treatment. Nasal washings and aspirates are unacceptable for Xpert Xpress SARS-CoV-2/FLU/RSV testing.  Fact Sheet for Patients: BloggerCourse.com  Fact Sheet for Healthcare Providers: SeriousBroker.it  This test is not yet approved or cleared by the [place]  FDA and has been authorized for detection and/or diagnosis of SARS-CoV-2 by FDA under an Emergency Use Authorization (EUA). This EUA will remain in effect (meaning this test can be used) for the duration of the COVID-19 declaration under Section 564(b)(1) of the Act, 21 U.S.C. section 360bbb-3(b)(1), unless the authorization is terminated or revoked.     Resp Syncytial Virus by PCR NEGATIVE NEGATIVE Final    Comment: (NOTE) Fact Sheet for Patients: BloggerCourse.com  Fact Sheet for Healthcare Providers: SeriousBroker.it  This test is not yet approved or cleared by the [place]  FDA and has been  authorized for detection and/or diagnosis of SARS-CoV-2 by FDA under an Emergency Use Authorization (EUA). This EUA will remain in effect (meaning this test can be used) for the duration of the COVID-19 declaration under Section 564(b)(1) of the Act, 21 U.S.C. section 360bbb-3(b)(1), unless the authorization is terminated or revoked.  Performed at [facility], [address] [place], [place] [address]   MRSA Next Gen by PCR, Nasal     Status: None   Collection Time: 12/05/23  2:25 AM   Specimen: Nasal Mucosa; Nasal Swab  Result Value Ref Range Status   MRSA by PCR Next Gen NOT DETECTED NOT DETECTED Final    Comment: (NOTE) The GeneXpert MRSA Assay (FDA approved for NASAL specimens only), is one component of a comprehensive MRSA colonization surveillance program. It is not intended to diagnose MRSA infection nor to guide or monitor treatment for MRSA infections. Test performance is not FDA approved in patients less than 69 years old. Performed at [facility], [address] [place], [place] [address]   Culture, blood (Routine X 2) w Reflex to ID Panel     Status: None   Collection Time: 12/05/23  5:41 AM   Specimen: BLOOD LEFT ARM  Result Value Ref Range Status   Specimen Description   Final    BLOOD LEFT ARM Performed at [facility], [address], [place], [place] [address]    Special Requests   Final    BOTTLES DRAWN AEROBIC AND ANAEROBIC Blood Culture adequate volume Performed at [facility], [address], [place], [place] [address]    Culture   Final    NO GROWTH 5 DAYS Performed at [facility], [address], [place], [place] [address]    Report Status 12/10/2023 FINAL  Final  Culture, blood (Routine X 2) w Reflex to ID Panel     Status: None   Collection Time: 12/05/23  5:41 AM   Specimen: BLOOD LEFT ARM  Result Value Ref Range Status   Specimen Description   Final    BLOOD LEFT  ARM Performed at [facility], [address], [place], [place] [address]    Special Requests   Final  BOTTLES DRAWN AEROBIC AND ANAEROBIC Blood Culture adequate volume Performed at [facility], [address] [place], [place] [address]    Culture   Final    NO GROWTH 5 DAYS Performed at [facility], [address], [place], [place] [address]    Report Status 12/10/2023 FINAL  Final  Expectorated Sputum Assessment w Gram Stain, Rflx to Resp Cult     Status: None   Collection Time: 12/05/23  1:29 PM   Specimen: Sputum  Result Value Ref Range Status   Specimen Description SPUTUM  Final   Special Requests NONE  Final   Sputum evaluation   Final    Sputum specimen not acceptable for testing.  Please recollect.   NOTIFIED H. SCHULTZ,RN ON 12/05/2023 AT 1347 BY SL Performed at [facility], [address] [place], [place] [address]    Report Status 12/05/2023 FINAL  Final  Respiratory (~20 pathogens) panel by PCR     Status: Abnormal   Collection Time: 12/05/23  1:29 PM   Specimen: Nasopharyngeal Swab; Respiratory  Result Value Ref Range Status   Adenovirus NOT DETECTED NOT DETECTED Final   Coronavirus 229E NOT DETECTED NOT DETECTED Final    Comment: (NOTE) The Coronavirus on the Respiratory Panel, DOES NOT test for the novel  Coronavirus (2019 nCoV)    Coronavirus HKU1 NOT DETECTED NOT DETECTED Final   Coronavirus NL63 NOT DETECTED NOT DETECTED Final   Coronavirus OC43 NOT DETECTED NOT DETECTED Final   Metapneumovirus NOT DETECTED NOT DETECTED Final   Rhinovirus / Enterovirus NOT DETECTED NOT DETECTED Final   Influenza A NOT DETECTED NOT DETECTED Final   Influenza B NOT DETECTED NOT DETECTED Final   Parainfluenza Virus 1 NOT DETECTED NOT DETECTED Final   Parainfluenza Virus 2 NOT DETECTED NOT DETECTED Final   Parainfluenza Virus 3 DETECTED (A) NOT DETECTED Final   Parainfluenza Virus 4 NOT DETECTED NOT DETECTED Final    Respiratory Syncytial Virus NOT DETECTED NOT DETECTED Final   Bordetella pertussis NOT DETECTED NOT DETECTED Final   Bordetella Parapertussis NOT DETECTED NOT DETECTED Final   Chlamydophila pneumoniae NOT DETECTED NOT DETECTED Final   Mycoplasma pneumoniae NOT DETECTED NOT DETECTED Final    Comment: Performed at [name], [address], [place], [place] [address]     Labs: BNP (last 3 results) Recent Labs    12/08/23 0400  BNP 32.9   Basic Metabolic Panel: Recent Labs  Lab 12/07/23 0254 12/08/23 0400 12/09/23 0332 12/10/23 0349 12/10/23 2222 12/11/23 0331 12/12/23 0253 12/13/23 0234  NA 138 135 137 137 132* 136 137 138  K 3.3* 4.1 3.5 3.7 3.5 3.2* 4.1 3.8  CL 101 101 100 98 93* 97* 100 98  CO2 26 23 27 28 27 29 28 29   GLUCOSE 168* 277* 270* 238* 359* 143* 135* 124*  BUN 10 16 20 17 16 13 12 11   CREATININE 0.43* 0.54 0.41* 0.52 0.75 0.59 0.45 0.46  CALCIUM  8.6* 8.4* 8.6* 8.8* 8.4* 8.5* 8.3* 8.9  MG 2.2 2.5* 2.4 2.3  --   --  2.3  --   PHOS 3.2 3.5  --  3.9  --   --  3.8  --    Liver Function Tests: Recent Labs  Lab 12/07/23 0254 12/08/23 0400  ALBUMIN 2.8* 2.7*   No results for input(s): LIPASE, AMYLASE in the last 168 hours. No results for input(s): AMMONIA in the last 168 hours. CBC: Recent Labs  Lab 12/07/23 0254 12/08/23 0400 12/09/23 0332 12/10/23 0349 12/13/23 0234  WBC 16.5* 11.3* 11.3* 13.2* 15.1*  NEUTROABS 10.0* 8.6* 8.7*  --   --   HGB 13.9 13.1 13.3 13.2 12.9  HCT 40.8 38.2 40.0 38.8 38.1  MCV 101.2* 100.5* 101.8* 103.2* 103.0*  PLT 179 168 176 160 152   Cardiac Enzymes: No results for input(s): CKTOTAL, CKMB, CKMBINDEX, TROPONINI in the last 168 hours. BNP: Invalid input(s): POCBNP CBG: Recent Labs  Lab 12/12/23 0802 12/12/23 1216 12/12/23 1649 12/12/23 2112 12/13/23 0820  GLUCAP 148* 370* 428* 300* 148*   D-Dimer No results for input(s): DDIMER in the last 72 hours. Hgb A1c No results for input(s):  HGBA1C in the last 72 hours. Lipid Profile No results for input(s): CHOL, HDL, LDLCALC, TRIG, CHOLHDL, LDLDIRECT in the last 72 hours. Thyroid function studies No results for input(s): TSH, T4TOTAL, T3FREE, THYROIDAB in the last 72 hours.  Invalid input(s): FREET3 Anemia work up No results for input(s): VITAMINB12, FOLATE, FERRITIN, TIBC, IRON, RETICCTPCT in the last 72 hours. Urinalysis    Component Value Date/Time   COLORURINE YELLOW 08/28/2023 0251   APPEARANCEUR CLEAR 08/28/2023 0251   APPEARANCEUR Clear 06/09/2023 1544   LABSPEC 1.005 08/28/2023 0251   PHURINE 6.5 08/28/2023 0251   GLUCOSEU CANCELED 11/19/2023 0417   GLUCOSEU NEGATIVE 05/24/2007 1539   HGBUR NEGATIVE 08/28/2023 0251   BILIRUBINUR NEGATIVE 08/28/2023 0251   BILIRUBINUR Negative 06/09/2023 1544   KETONESUR NEGATIVE 08/28/2023 0251   PROTEINUR Negative 11/19/2023 1402   PROTEINUR CANCELED 11/19/2023 0417   PROTEINUR NEGATIVE 08/28/2023 0251   UROBILINOGEN 0.2 12/29/2022 1608   UROBILINOGEN 0.2 12/28/2007 1602   NITRITE neg 11/19/2023 1402   NITRITE NEGATIVE 08/28/2023 0251   LEUKOCYTESUR Large (3+) (A) 11/19/2023 1402   LEUKOCYTESUR MODERATE (A) 08/28/2023 0251   Sepsis Labs Recent Labs  Lab 12/08/23 0400 12/09/23 0332 12/10/23 0349 12/13/23 0234  WBC 11.3* 11.3* 13.2* 15.1*   Microbiology Recent Results (from the past 240 hours)  Resp panel by RT-PCR (RSV, Flu A&B, Covid) Anterior Nasal Swab     Status: None   Collection Time: 12/04/23 10:57 PM   Specimen: Anterior Nasal Swab  Result Value Ref Range Status   SARS Coronavirus 2 by RT PCR NEGATIVE NEGATIVE Final    Comment: (NOTE) SARS-CoV-2 target nucleic acids are NOT DETECTED.  The SARS-CoV-2 RNA is generally detectable in upper respiratory specimens during the acute phase of infection. The lowest concentration of SARS-CoV-2 viral copies this assay can detect is 138 copies/mL. A negative result does not  preclude SARS-Cov-2 infection and should not be used as the sole basis for treatment or other patient management decisions. A negative result may occur with  improper specimen collection/handling, submission of specimen other than nasopharyngeal swab, presence of viral mutation(s) within the areas targeted by this assay, and inadequate number of viral copies(<138 copies/mL). A negative result must be combined with clinical observations, patient history, and epidemiological information. The expected result is Negative.  Fact Sheet for Patients:  BloggerCourse.com  Fact Sheet for Healthcare Providers:  SeriousBroker.it  This test is no t yet approved or cleared by the [place]  FDA and  has been authorized for detection and/or diagnosis of SARS-CoV-2 by FDA under an Emergency Use Authorization (EUA). This EUA will remain  in effect (meaning this test can be used) for the duration of the COVID-19 declaration under Section 564(b)(1) of the Act, 21 U.S.C.section 360bbb-3(b)(1), unless the authorization is terminated  or revoked sooner.  Influenza A by PCR NEGATIVE NEGATIVE Final   Influenza B by PCR NEGATIVE NEGATIVE Final    Comment: (NOTE) The Xpert Xpress SARS-CoV-2/FLU/RSV plus assay is intended as an aid in the diagnosis of influenza from Nasopharyngeal swab specimens and should not be used as a sole basis for treatment. Nasal washings and aspirates are unacceptable for Xpert Xpress SARS-CoV-2/FLU/RSV testing.  Fact Sheet for Patients: BloggerCourse.com  Fact Sheet for Healthcare Providers: SeriousBroker.it  This test is not yet approved or cleared by the [place]  FDA and has been authorized for detection and/or diagnosis of SARS-CoV-2 by FDA under an Emergency Use Authorization (EUA). This EUA will remain in effect (meaning this test can be used) for the  duration of the COVID-19 declaration under Section 564(b)(1) of the Act, 21 U.S.C. section 360bbb-3(b)(1), unless the authorization is terminated or revoked.     Resp Syncytial Virus by PCR NEGATIVE NEGATIVE Final    Comment: (NOTE) Fact Sheet for Patients: BloggerCourse.com  Fact Sheet for Healthcare Providers: SeriousBroker.it  This test is not yet approved or cleared by the [place]  FDA and has been authorized for detection and/or diagnosis of SARS-CoV-2 by FDA under an Emergency Use Authorization (EUA). This EUA will remain in effect (meaning this test can be used) for the duration of the COVID-19 declaration under Section 564(b)(1) of the Act, 21 U.S.C. section 360bbb-3(b)(1), unless the authorization is terminated or revoked.  Performed at [facility], [address], [place], [place] [address]   MRSA Next Gen by PCR, Nasal     Status: None   Collection Time: 12/05/23  2:25 AM   Specimen: Nasal Mucosa; Nasal Swab  Result Value Ref Range Status   MRSA by PCR Next Gen NOT DETECTED NOT DETECTED Final    Comment: (NOTE) The GeneXpert MRSA Assay (FDA approved for NASAL specimens only), is one component of a comprehensive MRSA colonization surveillance program. It is not intended to diagnose MRSA infection nor to guide or monitor treatment for MRSA infections. Test performance is not FDA approved in patients less than 93 years old. Performed at [facility], [address], [place], [place] [address]   Culture, blood (Routine X 2) w Reflex to ID Panel     Status: None   Collection Time: 12/05/23  5:41 AM   Specimen: BLOOD LEFT ARM  Result Value Ref Range Status   Specimen Description   Final    BLOOD LEFT ARM Performed at [facility], [address] [place], [place] [address]    Special Requests   Final    BOTTLES DRAWN AEROBIC AND ANAEROBIC Blood Culture adequate  volume Performed at [facility], [address], [place], [place] [address]    Culture   Final    NO GROWTH 5 DAYS Performed at [facility], [address], [place], [place] [address]    Report Status 12/10/2023 FINAL  Final  Culture, blood (Routine X 2) w Reflex to ID Panel     Status: None   Collection Time: 12/05/23  5:41 AM   Specimen: BLOOD LEFT ARM  Result Value Ref Range Status   Specimen Description   Final    BLOOD LEFT ARM Performed at [facility], [address], [place], [place] [address]    Special Requests   Final    BOTTLES DRAWN AEROBIC AND ANAEROBIC Blood Culture adequate volume Performed at [facility], [address] [place], [place] [address]  Culture   Final    NO GROWTH 5 DAYS Performed at [facility], [address] [place], [place] [address]    Report Status 12/10/2023 FINAL  Final  Expectorated Sputum Assessment w Gram Stain, Rflx to Resp Cult     Status: None   Collection Time: 12/05/23  1:29 PM   Specimen: Sputum  Result Value Ref Range Status   Specimen Description SPUTUM  Final   Special Requests NONE  Final   Sputum evaluation   Final    Sputum specimen not acceptable for testing.  Please recollect.   NOTIFIED H. SCHULTZ,RN ON 12/05/2023 AT 1347 BY SL Performed at [facility], [address], [place], [place] [address]    Report Status 12/05/2023 FINAL  Final  Respiratory (~20 pathogens) panel by PCR     Status: Abnormal   Collection Time: 12/05/23  1:29 PM   Specimen: Nasopharyngeal Swab; Respiratory  Result Value Ref Range Status   Adenovirus NOT DETECTED NOT DETECTED Final   Coronavirus 229E NOT DETECTED NOT DETECTED Final    Comment: (NOTE) The Coronavirus on the Respiratory Panel, DOES NOT test for the novel  Coronavirus (2019 nCoV)    Coronavirus HKU1 NOT DETECTED NOT DETECTED Final   Coronavirus NL63 NOT DETECTED NOT DETECTED Final   Coronavirus  OC43 NOT DETECTED NOT DETECTED Final   Metapneumovirus NOT DETECTED NOT DETECTED Final   Rhinovirus / Enterovirus NOT DETECTED NOT DETECTED Final   Influenza A NOT DETECTED NOT DETECTED Final   Influenza B NOT DETECTED NOT DETECTED Final   Parainfluenza Virus 1 NOT DETECTED NOT DETECTED Final   Parainfluenza Virus 2 NOT DETECTED NOT DETECTED Final   Parainfluenza Virus 3 DETECTED (A) NOT DETECTED Final   Parainfluenza Virus 4 NOT DETECTED NOT DETECTED Final   Respiratory Syncytial Virus NOT DETECTED NOT DETECTED Final   Bordetella pertussis NOT DETECTED NOT DETECTED Final   Bordetella Parapertussis NOT DETECTED NOT DETECTED Final   Chlamydophila pneumoniae NOT DETECTED NOT DETECTED Final   Mycoplasma pneumoniae NOT DETECTED NOT DETECTED Final    Comment: Performed at [facility], [address], [place], [place] [address]    Please note: You were cared for by a hospitalist during your hospital stay. Once you are discharged, your primary care physician will handle any further medical issues. Please note that NO REFILLS for any discharge medications will be authorized once you are discharged, as it is imperative that you return to your primary care physician (or establish a relationship with a primary care physician if you do not have one) for your post hospital discharge needs so that they can reassess your need for medications and monitor your lab values.    Time coordinating discharge: 40 minutes  SIGNED:   Ivonne Mustache, MD  [facility] 12/13/2023, 9:42 AM Pager [phone]  If 7PM-7AM, please contact night-coverage [email or web address] Password [email or web address]

## 2023-12-13 NOTE — Progress Notes (Signed)
 SATURATION QUALIFICATIONS: (This note is used to comply with regulatory documentation for home oxygen)  Patient Saturations on 2L [place] at Rest = 94%  Patient Saturations on 4-5L  while Ambulating = 88%  Please briefly explain why patient needs home oxygen:   Patient needs oxygen at home to maintain oxygen saturations above 88% per physician orders.

## 2023-12-13 NOTE — Plan of Care (Signed)
 Patient and patient's family educated on discharge instructions. Patient's PIV removed. Patient's belongings returned to patient. Patient was instructed on how to use oxygen tank. Patient taken to main entrance in wheelchair.  Problem: Education: Goal: Knowledge of General Education information will improve Description: Including pain rating scale, medication(s)/side effects and non-pharmacologic comfort measures 12/13/2023 1125 by Jalacia Mattila, Harlene DEL, RN Outcome: Completed/Met 12/13/2023 1057 by Skylier Kretschmer, Harlene DEL, RN Outcome: Adequate for Discharge   Problem: Health Behavior/Discharge Planning: Goal: Ability to manage health-related needs will improve 12/13/2023 1125 by Husam Hohn, Harlene DEL, RN Outcome: Completed/Met 12/13/2023 1057 by Cashus Halterman, Harlene DEL, RN Outcome: Adequate for Discharge   Problem: Clinical Measurements: Goal: Ability to maintain clinical measurements within normal limits will improve 12/13/2023 1125 by Jessicca Stitzer, Harlene DEL, RN Outcome: Completed/Met 12/13/2023 1057 by Yair Dusza, Harlene DEL, RN Outcome: Adequate for Discharge Goal: Will remain free from infection 12/13/2023 1125 by Nellene Courtois, Harlene DEL, RN Outcome: Completed/Met 12/13/2023 1057 by Philippa Harlene DEL, RN Outcome: Adequate for Discharge Goal: Diagnostic test results will improve 12/13/2023 1125 by Derrich Gaby, Harlene DEL, RN Outcome: Completed/Met 12/13/2023 1057 by Philippa Harlene DEL, RN Outcome: Adequate for Discharge Goal: Respiratory complications will improve 12/13/2023 1125 by Shantoya Geurts, Harlene DEL, RN Outcome: Completed/Met 12/13/2023 1057 by Philippa Harlene DEL, RN Outcome: Adequate for Discharge Goal: Cardiovascular complication will be avoided 12/13/2023 1125 by Lucie Friedlander, Harlene DEL, RN Outcome: Completed/Met 12/13/2023 1057 by Nicloe Frontera, Harlene DEL, RN Outcome: Adequate for Discharge   Problem: Activity: Goal: Risk for activity intolerance will decrease 12/13/2023 1125 by Marquail Bradwell, Harlene DEL, RN Outcome: Completed/Met 12/13/2023 1057 by  Tarius Stangelo, Harlene DEL, RN Outcome: Adequate for Discharge   Problem: Nutrition: Goal: Adequate nutrition will be maintained 12/13/2023 1125 by Rigo Letts, Harlene DEL, RN Outcome: Completed/Met 12/13/2023 1057 by Cecila Satcher, Harlene DEL, RN Outcome: Adequate for Discharge   Problem: Coping: Goal: Level of anxiety will decrease 12/13/2023 1125 by Gardenia Witter, Harlene DEL, RN Outcome: Completed/Met 12/13/2023 1057 by Niaja Stickley, Harlene DEL, RN Outcome: Adequate for Discharge   Problem: Elimination: Goal: Will not experience complications related to bowel motility 12/13/2023 1125 by Iden Stripling, Harlene DEL, RN Outcome: Completed/Met 12/13/2023 1057 by Philippa Harlene DEL, RN Outcome: Adequate for Discharge Goal: Will not experience complications related to urinary retention 12/13/2023 1125 by Tianne Plott, Harlene DEL, RN Outcome: Completed/Met 12/13/2023 1057 by Beatris Belen, Harlene DEL, RN Outcome: Adequate for Discharge   Problem: Pain Managment: Goal: General experience of comfort will improve and/or be controlled 12/13/2023 1125 by Myrtice Lowdermilk, Harlene DEL, RN Outcome: Completed/Met 12/13/2023 1057 by Maryan Sivak, Harlene DEL, RN Outcome: Adequate for Discharge   Problem: Safety: Goal: Ability to remain free from injury will improve 12/13/2023 1125 by Elaf Clauson, Harlene DEL, RN Outcome: Completed/Met 12/13/2023 1057 by Philippa Harlene DEL, RN Outcome: Adequate for Discharge   Problem: Skin Integrity: Goal: Risk for impaired skin integrity will decrease 12/13/2023 1125 by Averiana Clouatre, Harlene DEL, RN Outcome: Completed/Met 12/13/2023 1057 by Leam Madero, Harlene DEL, RN Outcome: Adequate for Discharge   Problem: Activity: Goal: Ability to tolerate increased activity will improve 12/13/2023 1125 by Kyser Wandel, Harlene DEL, RN Outcome: Completed/Met 12/13/2023 1057 by Analiah Drum, Harlene DEL, RN Outcome: Adequate for Discharge   Problem: Clinical Measurements: Goal: Ability to maintain a body temperature in the normal range will improve 12/13/2023 1125 by Shewanda Sharpe, Harlene DEL, RN Outcome:  Completed/Met 12/13/2023 1057 by Etan Vasudevan, Harlene DEL, RN Outcome: Adequate for Discharge   Problem: Respiratory: Goal: Ability to maintain adequate ventilation will improve 12/13/2023 1125 by Kreg Earhart, Harlene DEL, RN Outcome: Completed/Met 12/13/2023 1057 by Hashem Goynes, Harlene DEL,  RN Outcome: Adequate for Discharge Goal: Ability to maintain a clear airway will improve 12/13/2023 1125 by Novelle Addair, Harlene DEL, RN Outcome: Completed/Met 12/13/2023 1057 by Caleb Prigmore, Harlene DEL, RN Outcome: Adequate for Discharge   Problem: Education: Goal: Ability to describe self-care measures that may prevent or decrease complications (Diabetes Survival Skills Education) will improve 12/13/2023 1125 by Mohmed Farver, Harlene DEL, RN Outcome: Completed/Met 12/13/2023 1057 by Basma Buchner, Harlene DEL, RN Outcome: Adequate for Discharge Goal: Individualized Educational Video(s) 12/13/2023 1125 by Philippa Harlene DEL, RN Outcome: Completed/Met 12/13/2023 1057 by Philippa Harlene DEL, RN Outcome: Adequate for Discharge   Problem: Coping: Goal: Ability to adjust to condition or change in health will improve 12/13/2023 1125 by Zarielle Cea, Harlene DEL, RN Outcome: Completed/Met 12/13/2023 1057 by Tyan Dy, Harlene DEL, RN Outcome: Adequate for Discharge   Problem: Fluid Volume: Goal: Ability to maintain a balanced intake and output will improve 12/13/2023 1125 by Leauna Sharber, Harlene DEL, RN Outcome: Completed/Met 12/13/2023 1057 by Timberlyn Pickford, Harlene DEL, RN Outcome: Adequate for Discharge   Problem: Health Behavior/Discharge Planning: Goal: Ability to identify and utilize available resources and services will improve 12/13/2023 1125 by Bibi Economos, Harlene DEL, RN Outcome: Completed/Met 12/13/2023 1057 by Philippa Harlene DEL, RN Outcome: Adequate for Discharge Goal: Ability to manage health-related needs will improve 12/13/2023 1125 by Trang Bouse, Harlene DEL, RN Outcome: Completed/Met 12/13/2023 1057 by Juriel Cid, Harlene DEL, RN Outcome: Adequate for Discharge   Problem: Metabolic: Goal:  Ability to maintain appropriate glucose levels will improve 12/13/2023 1125 by Jenya Putz, Harlene DEL, RN Outcome: Completed/Met 12/13/2023 1057 by Philippa Harlene DEL, RN Outcome: Adequate for Discharge   Problem: Nutritional: Goal: Maintenance of adequate nutrition will improve 12/13/2023 1125 by Renesme Kerrigan, Harlene DEL, RN Outcome: Completed/Met 12/13/2023 1057 by Philippa Harlene DEL, RN Outcome: Adequate for Discharge Goal: Progress toward achieving an optimal weight will improve 12/13/2023 1125 by Jeriah Skufca, Harlene DEL, RN Outcome: Completed/Met 12/13/2023 1057 by Jeannetta Cerutti, Harlene DEL, RN Outcome: Adequate for Discharge   Problem: Skin Integrity: Goal: Risk for impaired skin integrity will decrease 12/13/2023 1125 by Randa Riss, Harlene DEL, RN Outcome: Completed/Met 12/13/2023 1057 by Toshiyuki Fredell, Harlene DEL, RN Outcome: Adequate for Discharge   Problem: Tissue Perfusion: Goal: Adequacy of tissue perfusion will improve 12/13/2023 1125 by Maclane Holloran, Harlene DEL, RN Outcome: Completed/Met 12/13/2023 1057 by Pleas Carneal, Harlene DEL, RN Outcome: Adequate for Discharge

## 2023-12-13 NOTE — TOC Transition Note (Signed)
 Transition of Care [facility]) - Discharge Note   Patient Details  Name: Emily [name] MRN: [id number] Date of Birth: [birth year]  Transition of Care [facility]) CM/SW Contact:  Sonda Manuella Quill, RN Phone Number: 12/13/2023, 10:20 AM   Clinical Narrative:    D/C orders received; pt to arrange OPPT thru PCP; travel tank from [employer] has been delivered to room; agency contact info placed in follow up provider section of d/c instructions; no TOC needs.    Final next level of care: Home/Self Care Barriers to Discharge: No Barriers Identified   Patient Goals and CMS Choice Patient states their goals for this hospitalization and ongoing recovery are:: return home   Choice offered to / list presented to : NA      Discharge Placement                       Discharge Plan and Services Additional resources added to the After Visit Summary for   In-house Referral: NA Discharge Planning Services: NA            DME Arranged: Oxygen DME Agency: [employer] Date DME Agency Contacted: 12/13/23 Time DME Agency Contacted: [phone] Representative spoke with at DME Agency: Arvella HH Arranged: NA HH Agency: NA        Social Drivers of Health (SDOH) Interventions SDOH Screenings   Food Insecurity: No Food Insecurity (12/05/2023)  Housing: Low Risk  (12/05/2023)  Transportation Needs: No Transportation Needs (12/05/2023)  Utilities: Not At Risk (12/05/2023)  Alcohol Screen: Low Risk  (09/24/2022)  Depression (PHQ2-9): High Risk (06/15/2023)  Financial Resource Strain: Low Risk  (07/13/2023)   Received from [facility]  Physical Activity: Unknown (07/13/2023)   Received from [facility]  Recent Concern: Physical Activity - Insufficiently Active (05/28/2023)  Social Connections: Moderately Integrated (12/05/2023)  Stress: No Stress Concern Present (07/13/2023)   Received from [facility]  Tobacco Use: High Risk (12/04/2023)     Readmission Risk Interventions    12/06/2023    10:58 AM  Readmission Risk Prevention Plan  Transportation Screening Complete  Medication Review (RN Care Manager) Complete  PCP or Specialist appointment within 3-5 days of discharge Complete  HRI or Home Care Consult Complete  SW Recovery Care/Counseling Consult Complete  Palliative Care Screening Not Applicable  Skilled Nursing Facility Not Applicable

## 2023-12-13 NOTE — Inpatient Diabetes Management (Signed)
 Inpatient Diabetes Program Recommendations  AACE/ADA: New Consensus Statement on Inpatient Glycemic Control  Target Ranges:  Prepandial:   less than 140 mg/dL      Peak postprandial:   less than 180 mg/dL (1-2 hours)      Critically ill patients:  140 - 180 mg/dL      Latest Reference Range & Units 12/12/23 08:02 12/12/23 12:16 12/12/23 16:49 12/12/23 21:12  Glucose-Capillary 70 - 99 mg/dL 851 (H) 629 (H) 571 (H) 300 (H)    Latest Reference Range & Units 12/13/23 02:34  Glucose 70 - 99 mg/dL 875 (H)    Review of Glycemic Control  Diabetes history: DM2 Outpatient Diabetes medications: Dexcom G7 CGM, Humalog  10 units TID with meals, Metformin  XR 500 mg BID (not taking) Current orders for Inpatient glycemic control: Semglee  40 units daily, Novolog  6 units TID with meals, Novolog  0-20 units TID with meals, Novolog  0-5 units at bedtime; Prednisone  20 mg QAM   Inpatient Diabetes Program Recommendations:     Insulin : Noted steroids changed from Solumedrol to Prednisone .  Please consider increasing meal coverage to Novolog  12 units TID with meals for meal coverage if patient eats at least 50% of meals.   Outpatient DM: At time of discharge, recommend to discharge on Lantus  40 units daily and Humalog  12 units TID with meals. Please provide Rx for Lantus  SoloStar pens [phone]), Humalog  Kwikpens [phone]), and insulin  pen needles [phone]).  Patient needs to follow up with Dr. Dartha (Endocrinologist) regarding DM management.   NOTE: Noted consult for Diabetes Coordinator. Diabetes Coordinator is not on campus over the weekend but available by pager from 8am to 5pm for questions or concerns. Per chart review noted patient sees [facility] and had video visit with Dr. Dartha on 09/21/23. Per note on 09/21/23, patient was asked to take Lantus  30-40 units QAM, Humalog  15 units TID with meals, and Metformin  XR 500 mg BID for DM control.  Noted patient message note on 09/24/23 that patient reported  she was not tolerating Metformin ; patient was told it was ok to stop the Metformin ; patient also noted that she wanted to take something besides insulin  as it was making her gain weight.   Thanks, Earnie Gainer, RN, MSN, CDCES Diabetes Coordinator Inpatient Diabetes Program [phone] (Team Pager from 8am to 5pm)

## 2023-12-13 NOTE — Progress Notes (Signed)
 NAME:  UCHENNA SEUFERT, MRN:  [id number], DOB:  01-29-1981, LOS: 8 ADMISSION DATE:  12/04/2023, CONSULTATION DATE:  12/05/23 REFERRING MD:  Toribio Hummer, MD CHIEF COMPLAINT:  Pneumonia   History of Present Illness:  Abelina Ketron is a 43 year old woman, daily smoker with asthma, DMII, hypertension, hypothyroidism, bipolar disorder, cirrhosis and lupus who is admitted for acute hypoxemic respiratory failure due to pneumonia.   She reports 1 week of progressive dyspnea, cough and wheezing. She was treated with Zpak and prednisone  at an Urgent Care recently but her symptoms did not improve after being on therapy for 2 days.   She is followed by Dr. Geronimo in pulmonary clinic for asthma and lung nodules. Her asthma regimen includes advair diskus 250-50mcg 1 puff twice daily, montelukast  10mg  daily and as needed albuterol .   She is smoking 1 pack per day. She has history of positive ANA testing.  Pertinent  Medical History    has a past medical history of Abnormal CT of liver, Anxiety, Asthma, Asthma, Bipolar 1 disorder (HCC), Bipolar disorder (HCC), Chronic pain, Cirrhosis (HCC), Depression, Diabetes mellitus without complication (HCC), Fatty liver, Gallstones, GERD (gastroesophageal reflux disease), Headache(784.0), Hyperlipidemia, Hypertension, Hypothyroidism, Lupus (systemic lupus erythematosus) (HCC), Pulmonary nodule, Tachycardia, Thyroid disease, and Vitamin D  deficiency.   has a past surgical history that includes Dilation and curettage of uterus and Upper gi endoscopy (08/28/2022).  Significant Hospital Events: Including procedures, antibiotic start and stop dates in addition to other pertinent events   8/16 admitted for respiratory failure and pneumonia, Viral panel positive for parainfluenza virus 8/17  - She is tearful this morning and wanting to go home. Mother at bedside. She has been weaned to 8L O2 8/1: HFNC 12L . Afebrile. Pulse ox 88-91%. PAtient and mom at bedside. She  is very fearful of ending up oin ventilator and dying. She is in tears. Refused BiPAP but after counseling agreed to try on. Also feels beathin worse afte rIV steroids changed to po per patient and mom.  Mom reports multiple family members sick 8/18-8/20 solumedrol 125mg  TID, ESR 84 8/20 used BiPAP last night and day. On15L HHFNC. Feeling better. On IV Solumedrol since yesterday. Wanting antibiotics. Not interested in doxy. Off precedex  (needed for BiPAP 8/21 using nocturnal bipap, feeling better, remains off precedex , weaned to salter HFNC 13L, good response to lasix   Interim History / Subjective:  No overnight events Stayed off BiPAP last night  Objective    Blood pressure 111/73, pulse 83, temperature 97.8 F (36.6 C), temperature source Oral, resp. rate 18, height 5' 3.5 (1.613 m), weight 81 kg, last menstrual period 10/29/2023, SpO2 93%.        Intake/Output Summary (Last 24 hours) at 12/13/2023 9057 Last data filed at 12/12/2023 2100 Gross per 24 hour  Intake 240 ml  Output 850 ml  Net -610 ml    Filed Weights   12/11/23 0537 12/12/23 0500 12/13/23 0500  Weight: 80.6 kg 80.9 kg 81 kg    Examination: General: Pleasant young lady, does not appear to be in distress HEENT: Moist oral mucosa Neuro: Awake alert oriented, nonfocal exam CV: S1-S2 appreciated PULM: Some rhonchi GI: Soft, bowel sounds appreciated Extremities: Skin is warm and dry Skin: no rashes   I reviewed last 24 h vitals and pain scores, last 48 h intake and output, last 24 h labs and trends, and last 24 h imaging results.  Resolved problem list   Assessment and Plan   Acute hypoxemic respiratory failure  Parainfluenza pneumonitis Viral pneumonia Exacerbation of asthma She is an active smoker is committed to quitting-has been on the patch Type 2 diabetes Cirrhosis History of lupus  Bipolar disorder  She has completed course of antibiotics Was transition to oral steroids  With concern for sleep  disordered breathing Will need an in-lab sleep study for further evaluation as she is a mouth breather  To continue oxygen supplementation when discharged  Hypertension Hypothyroidism  She is stable to be discharged from a pulmonary perspective  Jennet Epley, MD [place] [facility] Pager: See Tracey

## 2023-12-13 NOTE — TOC Progression Note (Addendum)
 Transition of Care [facility]) - Progression Note    Patient Details  Name: Emily [name] MRN: [id number] Date of Birth: December 28, 1980  Transition of Care [facility]) CM/SW Contact  Sonda Manuella Quill, RN Phone Number: 12/13/2023, 9:04 AM  Clinical Narrative:    [facility] consulted for D/C planning; orders received for HHPT, and home oxygen; spoke w/ pt and family in room; pt agreed to received recc service/home oxygen; she does not have an agency preference for either; spoke w/ Arvella at [place]; he said agency can provide oxygen, and travel tank will be delivered to room; referral for HHPT given to [place] at [place], [place] at [place], and [place] at [place]; awaiting responses.  [id number] Huxley at [place]; agency unable to provide service   [phone]- notified by Amy at [place], agency unable to provide service  [phone]- notified by Cassius at [facility], agency unable to provide service  Dr Jillian notified via secure chat; he said ok for pt to receive OPPT to be arranged thru pt's PCP; pt notified, and she will arrange thru PCP. Barriers to Discharge: Continued Medical Work up               Expected Discharge Plan and Services In-house Referral: NA Discharge Planning Services: NA   Living arrangements for the past 2 months: Single Family Home                 DME Arranged: Oxygen DME Agency: [employer] Date DME Agency Contacted: 12/13/23 Time DME Agency Contacted: [phone] Representative spoke with at DME Agency: Arvella HH Arranged: NA HH Agency: NA         Social Drivers of Health (SDOH) Interventions SDOH Screenings   Food Insecurity: No Food Insecurity (12/05/2023)  Housing: Low Risk  (12/05/2023)  Transportation Needs: No Transportation Needs (12/05/2023)  Utilities: Not At Risk (12/05/2023)  Alcohol Screen: Low Risk  (09/24/2022)  Depression (PHQ2-9): High Risk (06/15/2023)  Financial Resource Strain: Low Risk  (07/13/2023)   Received from [facility]  Physical  Activity: Unknown (07/13/2023)   [facility]  Recent Concern: Physical Activity - Insufficiently Active (05/28/2023)  Social Connections: Moderately Integrated (12/05/2023)  Stress: No Stress Concern Present (07/13/2023)   Received from [facility]  Tobacco Use: High Risk (12/04/2023)    Readmission Risk Interventions    12/06/2023   10:58 AM  Readmission Risk Prevention Plan  Transportation Screening Complete  Medication Review (RN Care Manager) Complete  PCP or Specialist appointment within 3-5 days of discharge Complete  HRI or Home Care Consult Complete  SW Recovery Care/Counseling Consult Complete  Palliative Care Screening Not Applicable  Skilled Nursing Facility Not Applicable

## 2023-12-13 NOTE — Plan of Care (Signed)
  Problem: Education: Goal: Knowledge of General Education information will improve Description: Including pain rating scale, medication(s)/side effects and non-pharmacologic comfort measures Outcome: Progressing   Problem: Clinical Measurements: Goal: Respiratory complications will improve Outcome: Progressing Goal: Cardiovascular complication will be avoided Outcome: Progressing   Problem: Elimination: Goal: Will not experience complications related to bowel motility Outcome: Progressing Goal: Will not experience complications related to urinary retention Outcome: Progressing   Problem: Coping: Goal: Level of anxiety will decrease Outcome: Not Progressing   Problem: Pain Managment: Goal: General experience of comfort will improve and/or be controlled Outcome: Not Progressing

## 2023-12-14 ENCOUNTER — Telehealth: Payer: Self-pay

## 2023-12-14 NOTE — Transitions of Care (Post Inpatient/ED Visit) (Signed)
   12/14/2023  Name: Emily [name] MRN: [id number] DOB: [birth year]  Today's TOC FU Call Status: Today's TOC FU Call Status:: Unsuccessful Call (2nd Attempt) Unsuccessful Call (2nd Attempt) Date: 12/14/23 (patient called back and left a VM. I attempted again and was unsuccessful)  Attempted to reach the patient regarding the most recent Inpatient/ED visit.  Follow Up Plan: Additional outreach attempts will be made to reach the patient to complete the Transitions of Care (Post Inpatient/ED visit) call.  Alan Ee, RN, BSN, CEN [employer]- Transition of Care Team.  [facility] [phone]

## 2023-12-14 NOTE — Transitions of Care (Post Inpatient/ED Visit) (Signed)
   12/14/2023  Name: Emily [name] MRN: [id number] DOB: [birth year]  Today's TOC FU Call Status: Today's TOC FU Call Status:: Unsuccessful Call (1st Attempt) Unsuccessful Call (1st Attempt) Date: 12/14/23  Attempted to reach the patient regarding the most recent Inpatient/ED visit.  Follow Up Plan: Additional outreach attempts will be made to reach the patient to complete the Transitions of Care (Post Inpatient/ED visit) call.   Alan Ee, RN, BSN, CEN [employer]- Transition of Care Team.  [facility] [phone]

## 2023-12-15 ENCOUNTER — Telehealth: Payer: Self-pay

## 2023-12-15 DIAGNOSIS — R0602 Shortness of breath: Secondary | ICD-10-CM

## 2023-12-15 NOTE — Telephone Encounter (Signed)
 Copied from CRM [id number]. Topic: Clinical - Prescription Issue >> Dec 15, 2023  2:37 PM Macario HERO wrote: Reason for CRM: Patient is calling to request a wheelchair because she is currently on oxygen and is unable to walk.

## 2023-12-15 NOTE — Transitions of Care (Post Inpatient/ED Visit) (Signed)
 12/15/2023  Name: Emily [name] MRN: [id number] DOB: April 27, 1980  Today's TOC FU Call Status: Today's TOC FU Call Status:: Successful TOC FU Call Completed TOC FU Call Complete Date: 12/15/23 Patient's Name and Date of Birth confirmed.  Transition Care Management Follow-up Telephone Call How have you been since you were released from the hospital?: Better (Reports that she is breathing good.) Any questions or concerns?: No  Items Reviewed: Did you receive and understand the discharge instructions provided?: Yes Medications obtained,verified, and reconciled?: Yes (Medications Reviewed) Any new allergies since your discharge?: No Dietary orders reviewed?: Yes Type of Diet Ordered:: CARB MODIFIED Do you have support at home?: Yes People in Home [RPT]: parent(s) Name of Support/Comfort Primary Source: mom  Medications Reviewed Today: Medications Reviewed Today     Reviewed by Rumalda Alan PENNER, RN (Registered Nurse) on 12/15/23 at 1424  Med List Status: <None>   Medication Order Taking? Sig Documenting Provider Last Dose Status Informant  ACCU-CHEK AVIVA PLUS test strip [id number] Yes 1 each by Other route 3 (three) times daily. [provider]  Active   ACCU-CHEK FASTCLIX LANCETS MISC [id number] Yes USE AS DIRECTED Tysinger, Alm RAMAN, PA-C  Active   albuterol  (VENTOLIN  HFA) 108 (90 Base) MCG/ACT inhaler [id number] Yes Inhale 2 puffs into the lungs every 6 (six) hours as needed for wheezing or shortness of breath. Ruthell Lauraine FALCON, NP  Active Self  amLODipine  (NORVASC ) 10 MG tablet [id number] Yes Take 1 tablet (10 mg total) by mouth daily. Jillian Buttery, MD  Active   benzonatate  (TESSALON ) 100 MG capsule [id number] Yes Take 1 capsule (100 mg total) by mouth 3 (three) times daily as needed for cough. Gladis Elsie BROCKS, PA-C  Active Self  Blood Glucose Monitoring Suppl (ACCU-CHEK AVIVA PLUS) w/Device PRESSLEY [id number] Yes USE AS DIRECTED Bulah Alm RAMAN, PA-C  Active Self  Blood  Glucose Monitoring Suppl DEVI [id number] Yes 1 each by Does not apply route in the morning, at noon, and at bedtime. May substitute to any manufacturer covered by patient's insurance. Dartha Ernst, MD  Active Self  clonazePAM  (KLONOPIN ) 1 MG tablet [id number] Yes Take 1 mg by mouth 2 (two) times daily as needed for anxiety. [provider]  Active Self  Continuous Glucose Receiver (DEXCOM G7 RECEIVER) DEVI [id number]  1 Device by Does not apply route continuous.  Patient not taking: Reported on 12/15/2023   Motwani, Komal, MD  Active Self  Continuous Glucose Sensor (DEXCOM G7 SENSOR) [place] [id number]  USE AS DIRECTED  Patient not taking: Reported on 12/05/2023   Dartha Ernst, MD  Active Self  cyanocobalamin  1000 MCG tablet [id number] Yes Take 1 tablet (1,000 mcg total) by mouth daily.  Patient taking differently: Take 1 tablet (1,000 mcg total) by mouth daily.   Jillian Buttery, MD  Active   famotidine  (PEPCID ) 20 MG tablet [id number] Yes Take 20 mg by mouth 2 (two) times daily. [provider]  Active Self  fluticasone -salmeterol (ADVAIR) 250-50 MCG/ACT AEPB [id number]  Inhale 1 puff into the lungs every 12 (twelve) hours. Rinse after use.  Patient not taking: Reported on 12/15/2023   Orlie Madelin RAMAN, NP  Active Self  folic acid  (FOLVITE ) 1 MG tablet [id number] Yes Take 1 tablet (1 mg total) by mouth daily. Jillian Buttery, MD  Active   guaiFENesin  (MUCINEX ) 600 MG 12 hr tablet [id number] Yes Take 2 tablets (1,200 mg total) by mouth 2 (two) times daily for 7 days. Jillian Buttery, MD  Active   insulin  glargine (LANTUS   SOLOSTAR) 100 UNIT/ML Solostar Pen [id number] Yes Inject 40 Units into the skin daily. Jillian Buttery, MD  Active   insulin  lispro (HUMALOG  KWIKPEN) 100 UNIT/ML KwikPen [id number] Yes Inject 12 Units into the skin 3 (three) times daily. Jillian Buttery, MD  Active   Insulin  Pen Needle 32G X 4 MM MISC [id number] Yes 1 Device by Does not apply route in the morning, at noon, in  the evening, and at bedtime. Jillian Buttery, MD  Active   ipratropium (ATROVENT ) 0.03 % nasal spray [id number] Yes Place 2 sprays into both nostrils every 12 (twelve) hours. Gladis Elsie BROCKS, PA-C  Active Self  ipratropium-albuterol  (DUONEB) 0.5-2.5 (3) MG/3ML SOLN [id number] Yes Take 3 mLs by nebulization in the morning, at noon, and at bedtime. Geronimo Amel, MD  Active Self  lamoTRIgine  (LAMICTAL ) 150 MG tablet [id number] Yes Take 150 mg by mouth at bedtime. [provider]  Active Self  levothyroxine  (SYNTHROID ) 75 MCG tablet [id number] Yes TAKE 1 TABLET BY MOUTH EVERY DAY BEFORE BREAKFAST Williamson, Joanna R, NP  Active Self  montelukast  (SINGULAIR ) 10 MG tablet [id number] Yes TAKE 1 TABLET BY MOUTH EVERYDAY AT BEDTIME Parrett, Madelin RAMAN, NP  Active Self  Nebulizers (COMPRESSOR/NEBULIZER) MISC [id number] Yes Use as directed Geronimo Amel, MD  Active Self  nicotine  (NICODERM CQ  - DOSED IN MG/24 HOURS) 21 mg/24hr patch [id number] Yes Place 1 patch (21 mg total) onto the skin daily. Jillian Buttery, MD  Active   nitroGLYCERIN  (NITROSTAT ) 0.4 MG SL tablet [id number] Yes Place 1 tablet (0.4 mg total) under the tongue every 5 (five) minutes as needed for chest pain.  Patient taking differently: Place 1 tablet (0.4 mg total) under the tongue every 5 (five) minutes as needed for chest pain.   Nahser, Aleene PARAS, MD  Active Self  PARoxetine  (PAXIL ) 10 MG tablet [id number] Yes Take 10 mg by mouth in the morning. [provider]  Active Self  predniSONE  (DELTASONE ) 20 MG tablet [id number] Yes Take 1 tablet (20 mg total) by mouth daily with breakfast for 2 days.  Patient taking differently: Take 1 tablet (20 mg total) by mouth daily with breakfast for 2 days.   Jillian Buttery, MD  Active   promethazine -dextromethorphan  (PROMETHAZINE -DM) 6.25-15 MG/5ML syrup [id number] Yes Take 5 mLs by mouth 4 (four) times daily as needed. Vivienne Delon HERO, PA-C  Active Self  propranolol  (INDERAL ) 20 MG  tablet [id number] Yes Take 1 tablet (20 mg total) by mouth 3 (three) times daily. Lavona Agent, MD  Active Self  Respiratory Therapy Supplies (NEBULIZER/TUBING/MOUTHPIECE) KIT [id number] Yes Use as directed Geronimo Amel, MD  Active   rosuvastatin  (CRESTOR ) 5 MG tablet [id number] Yes Take 1 tablet (5 mg total) by mouth daily. Motwani, Komal, MD  Active Self  solifenacin  (VESICARE ) 5 MG tablet [id number] Yes Take 1 tablet (5 mg total) by mouth daily. Signa Delon LABOR, NP  Active Self  TYLENOL  500 MG tablet [id number] Yes Take 500-1,000 mg by mouth every 6 (six) hours as needed for mild pain (pain score 1-3) or headache. [provider]  Active Self  Vitamin D , Ergocalciferol , (DRISDOL ) 1.25 MG (50000 UNIT) CAPS capsule [id number] Yes Take 50,000 Units by mouth every Wednesday. [provider]  Active Self  ziprasidone  (GEODON ) 60 MG capsule [id number] Yes Take 60 mg by mouth at bedtime. [provider]  Active Self            Home Care and Equipment/Supplies: Were Home Health Services Ordered?: No Any new equipment or medical supplies  ordered?: Yes Name of Medical supply agency?: [employer] [employer] Were you able to get the equipment/medical supplies?: Yes Do you have any questions related to the use of the equipment/supplies?: No  Functional Questionnaire: Do you need assistance with bathing/showering or dressing?: Yes (mom) Do you need assistance with meal preparation?: Yes (mom) Do you need assistance with eating?: No Do you have difficulty maintaining continence: No Do you need assistance with getting out of bed/getting out of a chair/moving?: No Do you have difficulty managing or taking your medications?: No  Follow up appointments reviewed: PCP Follow-up appointment confirmed?: No (Will call today and make an appointment) MD Provider Line [phone] Given: No Specialist Hospital Follow-up appointment confirmed?: Yes Date of Specialist follow-up  appointment?: 12/18/23 Follow-Up Specialty Provider:: Pulmonary Do you need transportation to your follow-up appointment?: No Do you understand care options if your condition(s) worsen?: Yes-patient verbalized understanding  SDOH Interventions Today    Flowsheet Row Most Recent Value  SDOH Interventions   Food Insecurity Interventions Intervention Not Indicated  Housing Interventions Intervention Not Indicated  Utilities Interventions Intervention Not Indicated   Phone call with patient who reports that she is doing well.Reports she is using her oxygen but is trying to wean herself. She has pulmonary follow up this week DM- higher than normal CBG levels due to steroids. Encouraged patient to follow her diet and drink more water. Reviewed importance of follow up with PCP. Patients mother drives her to her appointments. Encouraged patient to take all her medications as prescribed. Reviewed importance of calling MD for changes in medical attentions.Reviewed and encouraged patient to continue to avoid smoking and using her patches.  Reviewed and offered 30 day TOC program and patient declined.  Alan Ee, RN, BSN, CEN [employer]- Transition of Care Team.  [facility] [phone]

## 2023-12-16 ENCOUNTER — Telehealth: Payer: Self-pay | Admitting: *Deleted

## 2023-12-16 MED ORDER — FLUTICASONE-SALMETEROL 250-50 MCG/ACT IN AEPB: 1.0000 | INHALATION_SPRAY | Freq: Two times a day (BID) | RESPIRATORY_TRACT | 11 refills | Status: DC

## 2023-12-16 NOTE — Telephone Encounter (Signed)
 Patient would like to know if an alternate wheelchair can be ordered for you. Any kind doesn't have to be motorized or anything. Patient needs something to get to the end of the road or to be mobile to get into place to eliminate her having to walk a long distance.  CB#[phone]... Patient will be available this afternoon and tomorrow.

## 2023-12-16 NOTE — Telephone Encounter (Signed)
 Copied from CRM [id number]. Topic: Clinical - Medication Question >> Dec 15, 2023 11:28 AM Rilla NOVAK wrote: Reason for CRM:  Patient discharge from hospital recently.  States they want her to take Advair inhaler and pulmacourt (?) for breathing. She states they did not fill these new scripts.  Appt schedule with Ramaswamy on 9/12.  Please call patient to help discuss new meds on her discharge instructions [phone].      ----------------------------------------------------------------------- From previous Reason for Contact - Medication Refill: Medication:   Has the patient contacted their pharmacy?   (Agent: If no, request that the patient contact the pharmacy for the refill. If patient does not wish to contact the pharmacy document the reason why and proceed with request.) (Agent: If yes, when and what did the pharmacy advise?)  This is the patient's preferred pharmacy:  [id number] - [place], [place] - [address] [place] [address] Phone: [phone] Fax: [phone]  Is this the correct pharmacy for this prescription?   If no, delete pharmacy and type the correct one.   Has the prescription been filled recently?    Is the patient out of the medication?    Has the patient been seen for an appointment in the last year OR does the patient have an upcoming appointment?    Can we respond through MyChart?    Agent: Please be advised that Rx refills may take up to 3 business days. We ask that you follow-up with your pharmacy.   ----------------------------------------------------------------------- From previous Reason for Contact - Scheduling: Patient/patient representative is calling to schedule an appointment. Refer to attachments for appointment information.

## 2023-12-16 NOTE — Telephone Encounter (Signed)
 Called and spoke with the pt  She states needs rx for advair  I have sent this to her pharmacy  Nothing further needed

## 2023-12-16 NOTE — Telephone Encounter (Signed)
 Attempted to call pt lvm for pt insurance wont cover

## 2023-12-16 NOTE — Addendum Note (Signed)
 Addended by: ELNER NANNY B on: 12/16/2023 04:10 PM   Modules accepted: Orders

## 2023-12-17 ENCOUNTER — Telehealth: Payer: Self-pay

## 2023-12-17 NOTE — Telephone Encounter (Signed)
 Copied from CRM [id number] [id number]. Topic: Clinical - Prescription Issue >> Dec 15, 2023  2:37 PM Macario HERO wrote: Reason for CRM: Patient is calling to request a wheelchair because she is currently on oxygen and is unable to walk. >> Dec 17, 2023  1:34 PM Robinson H wrote: Patient following up on wheelchair request, would like to know the status please reach out.  Remmie  [phone]  >> Dec 17, 2023 11:23 AM Chiquita SQUIBB wrote: Patient is calling in for the status of the wheelchair, advised patient of recent telephone encounter.  >> Dec 16, 2023  4:52 PM Drema MATSU wrote: Patient wants Margean to put in an order for [employer]. They are needing a written consent stating that she needs this wheel chair to get around apartment and appointments. [employer] [phone]

## 2023-12-17 NOTE — Telephone Encounter (Signed)
 Noted called pt and pt is aware and understanding order is faxed.

## 2023-12-17 NOTE — Telephone Encounter (Signed)
 Called pt sent in wheelchair for pt.

## 2023-12-18 ENCOUNTER — Ambulatory Visit: Admitting: Adult Health

## 2023-12-18 ENCOUNTER — Encounter: Payer: Self-pay | Admitting: Adult Health

## 2023-12-18 VITALS — BP 132/78 | HR 84 | Temp 97.8°F | Ht 63.5 in | Wt 186.2 lb

## 2023-12-18 DIAGNOSIS — R0683 Snoring: Secondary | ICD-10-CM

## 2023-12-18 MED ORDER — TRELEGY ELLIPTA 200-62.5-25 MCG/ACT IN AEPB
1.0000 | INHALATION_SPRAY | Freq: Every day | RESPIRATORY_TRACT | 5 refills | Status: DC
Start: 2023-12-18 — End: 2024-01-05

## 2023-12-18 NOTE — Patient Instructions (Addendum)
 Change Advair to Trelegy 200 1 puff daily  Mucinex  DM Twice daily  As needed  Cough/cogestion .  Tessalon  Three times a day  As needed  cough .  Great job not smoking .  Follow up for dentist as discussed.  Set up for Split night sleep study.  Continue on Oxygen 2l/m  Check chest xray on return  Follow up with Dr. Geronimo as planned and As needed   Please contact office for sooner follow up if symptoms do not improve or worsen or seek emergency care

## 2023-12-18 NOTE — Progress Notes (Signed)
 QUALIFYING WALK 12/18/2023  SATURATION QUALIFICATIONS: (This note is used to comply with regulatory documentation for home oxygen)  Patient Saturations on Room Air at Rest = 97%  Patient Saturations on Room Air while Ambulating = 88%  Patient Saturations on 2L Liters of oxygen while Ambulating = 96%  Please briefly explain why patient needs home oxygen: Patient needs 2L continuous oxygen while ambulating to keep SaO2 at or above 88%-90%.

## 2023-12-18 NOTE — Progress Notes (Addendum)
 @Patient  ID: Emily [name], female    DOB: 03-28-1981, 43 y.o.   MRN: [id number]  Chief Complaint  Patient presents with   Hospitalization Follow-up    Referring provider: [name], NP  HPI: 43 year old female heavy smoking history followed for chronic asthmatic bronchitis Medical history significant for diabetes, bipolar disorder and panic disorder  TEST/EVENTS :  Labs: 2020 :  Ace - 81 Esr - 30 Rh<14 CCP<16 ANCA - negative  Hypersensitive activity pneumonitis panel negative Anti-Jo 1 less than 0.2 RNP antibodies greater than 8 Sjogren's syndrome antibodies negative Anti-scleroderma antibody less than 1 Aldolase 4.5 MPO/PR-3 (ANCA) antibodies-less than 1 Total CK 47, CK-MB less than 7 -negative     04/01/2018-CT chest high resolution- few patchy areas of mild groundglass attenuation in the lungs bilaterally nonspecific, stable pulmonary nodules from 2009    feNO - 01/26/2018 - 5 ppbd and is normal    01/08/2015- spirometry- FVC 1.33 (38% predicted), ratio 72, FEV1 1.01 (37% predicted)   High-resolution CT chest July 25, 2020 bandlike scarring left base, no evidence of interstitial lung disease, mild air trapping, fine centrilobular nodularity concentrated in the lung apices consistent with smoking-related bronchiolitis. Neg for ILD .   12/18/2023 Post hospital follow up   Discussed the use of AI scribe software for clinical note transcription with the patient, who gave verbal consent to proceed.  History of Present Illness Emily [name] is a 43 year old female with asthma who presents for a post-hospital follow-up after a viral illness exacerbated her condition. She is accompanied by her mother, who is also her primary caregiver.  She was recently admitted with prolonged hospitalization with severe sepsis secondary to parainfluenza virus and pneumonia along with acute respiratory failure.   During her hospital stay, she required oxygen therapy and was  treated with BiPAP in the ICU. She was administered antibiotics and steroids, which have since been completed. She was discharged on four liters of oxygen, which she has reduced to one liter at home, occasionally increasing to two liters as needed.  Walk test today in office shows patient needs 2 L of oxygen to maintain O2 saturations greater than 88 to 90%. CT chest showed diffuse groundglass opacities bilaterally and solid lung opacification in the dependent portions. She continues to use her Advair inhaler twice daily for asthma management and has a DuoNeb nebulizer for use as needed. She has quit smoking.  She reports good appetite and improved nutrition since her hospitalization. She has been taking vitamin D  and B supplements.  There is a concern for sleep apnea due to low oxygen levels during sleep and symptoms such as snoring and daytime sleepiness. Her mother has been assisting her with medication management and ensuring she uses her oxygen therapy correctly. She has been staying with her mother since her discharge to ensure her safety and proper care, as her oxygen levels have been dropping during sleep.       Allergies  Allergen Reactions   Bactrim  [Sulfamethoxazole -Trimethoprim ] Anaphylaxis, Shortness Of Breath and Other (See Comments)    Chest pain, trouble breathing   Ciprofloxacin Palpitations   Clindamycin /Lincomycin Other (See Comments)    Chest Pain   Flagyl  [Metronidazole ] Shortness Of Breath   Hydrocodone Nausea Only   Iodinated Contrast Media Anaphylaxis, Swelling and Other (See Comments)    Body swells, too   Ondansetron Shortness Of Breath   Liraglutide  Nausea And Vomiting and Other (See Comments)    Victoza    Doxycycline  Nausea And  Vomiting   Metformin  And Related Other (See Comments)    Tore up my stomach   Penicillins Rash   Synjardy  Xr [Empagliflozin-Metformin  Hcl Er] Rash and Other (See Comments)    Diaper rash    Immunization History  Administered  Date(s) Administered   Influenza Split 03/22/2014   Influenza Whole 01/13/2008   Influenza, Quadrivalent, Recombinant, Inj, Pf 01/23/2022   Influenza, Seasonal, Injecte, Preservative Fre 04/18/2015   Influenza,inj,Quad PF,6+ Mos 04/18/2015, 01/08/2022   PFIZER(Purple Top)SARS-COV-2 Vaccination 03/12/2020, 04/16/2020   Pneumococcal Polysaccharide-23 05/10/2012   Tdap 01/02/2006, 08/08/2015    Past Medical History:  Diagnosis Date   Abnormal CT of liver    Anxiety    Asthma    Asthma    Bipolar 1 disorder (HCC)    Bipolar disorder (HCC)    Chronic pain    Cirrhosis (HCC)    Depression    Diabetes mellitus without complication (HCC)    Fatty liver    Gallstones    GERD (gastroesophageal reflux disease)    Headache(784.0)    Hyperlipidemia    Hypertension    Hypothyroidism    Lupus (systemic lupus erythematosus) (HCC)    Pulmonary nodule    Tachycardia    Thyroid disease    Vitamin D  deficiency     Tobacco History: Social History   Tobacco Use  Smoking Status Former   Current packs/day: 0.00   Types: Cigarettes   Quit date: 12/04/2023   Years since quitting: 0.0   Passive exposure: Never  Smokeless Tobacco Never  Tobacco Comments   Stopped smoking 12/04/2023   Counseling given: Not Answered Tobacco comments: Stopped smoking 12/04/2023   Outpatient Medications Prior to Visit  Medication Sig Dispense Refill   ACCU-CHEK AVIVA PLUS test strip 1 each by Other route 3 (three) times daily.     ACCU-CHEK FASTCLIX LANCETS MISC USE AS DIRECTED 102 each 3   albuterol  (VENTOLIN  HFA) 108 (90 Base) MCG/ACT inhaler Inhale 2 puffs into the lungs every 6 (six) hours as needed for wheezing or shortness of breath. 18 g 3   amLODipine  (NORVASC ) 10 MG tablet Take 1 tablet (10 mg total) by mouth daily. 60 tablet 0   benzonatate  (TESSALON ) 100 MG capsule Take 1 capsule (100 mg total) by mouth 3 (three) times daily as needed for cough. 30 capsule 0   Blood Glucose Monitoring Suppl  (ACCU-CHEK AVIVA PLUS) w/Device KIT USE AS DIRECTED 1 kit 0   Blood Glucose Monitoring Suppl DEVI 1 each by Does not apply route in the morning, at noon, and at bedtime. May substitute to any manufacturer covered by patient's insurance. 1 each 0   clonazePAM  (KLONOPIN ) 1 MG tablet Take 1 mg by mouth 2 (two) times daily as needed for anxiety.     Continuous Glucose Receiver (DEXCOM G7 RECEIVER) DEVI 1 Device by Does not apply route continuous. 1 each 0   Continuous Glucose Sensor (DEXCOM G7 SENSOR) MISC USE AS DIRECTED 9 each 0   cyanocobalamin  1000 MCG tablet Take 1 tablet (1,000 mcg total) by mouth daily. (Patient taking differently: Take 1 tablet (1,000 mcg total) by mouth daily.) 60 tablet 0   famotidine  (PEPCID ) 20 MG tablet Take 20 mg by mouth 2 (two) times daily.     fluticasone -salmeterol (ADVAIR) 250-50 MCG/ACT AEPB Inhale 1 puff into the lungs every 12 (twelve) hours. Rinse after use. 60 each 11   folic acid  (FOLVITE ) 1 MG tablet Take 1 tablet (1 mg total) by mouth daily. 30 tablet  0   guaiFENesin  (MUCINEX ) 600 MG 12 hr tablet Take 2 tablets (1,200 mg total) by mouth 2 (two) times daily for 7 days. 28 tablet 0   insulin  glargine (LANTUS  SOLOSTAR) 100 UNIT/ML Solostar Pen Inject 40 Units into the skin daily. 25 mL 0   insulin  lispro (HUMALOG  KWIKPEN) 100 UNIT/ML KwikPen Inject 12 Units into the skin 3 (three) times daily. 25 mL 0   Insulin  Pen Needle 32G X 4 MM MISC 1 Device by Does not apply route in the morning, at noon, in the evening, and at bedtime. 100 each 0   ipratropium (ATROVENT ) 0.03 % nasal spray Place 2 sprays into both nostrils every 12 (twelve) hours. 30 mL 0   ipratropium-albuterol  (DUONEB) 0.5-2.5 (3) MG/3ML SOLN Take 3 mLs by nebulization in the morning, at noon, and at bedtime. 270 mL 5   lamoTRIgine  (LAMICTAL ) 150 MG tablet Take 150 mg by mouth at bedtime.     levothyroxine  (SYNTHROID ) 75 MCG tablet TAKE 1 TABLET BY MOUTH EVERY DAY BEFORE BREAKFAST 90 tablet 0    montelukast  (SINGULAIR ) 10 MG tablet TAKE 1 TABLET BY MOUTH EVERYDAY AT BEDTIME 90 tablet 3   Nebulizers (COMPRESSOR/NEBULIZER) MISC Use as directed 1 each 0   nicotine  (NICODERM CQ  - DOSED IN MG/24 HOURS) 21 mg/24hr patch Place 1 patch (21 mg total) onto the skin daily. 28 patch 0   nitroGLYCERIN  (NITROSTAT ) 0.4 MG SL tablet Place 1 tablet (0.4 mg total) under the tongue every 5 (five) minutes as needed for chest pain. (Patient taking differently: Place 1 tablet (0.4 mg total) under the tongue every 5 (five) minutes as needed for chest pain.) 25 tablet 3   PARoxetine  (PAXIL ) 10 MG tablet Take 10 mg by mouth in the morning.     promethazine -dextromethorphan  (PROMETHAZINE -DM) 6.25-15 MG/5ML syrup Take 5 mLs by mouth 4 (four) times daily as needed. 118 mL 0   propranolol  (INDERAL ) 20 MG tablet Take 1 tablet (20 mg total) by mouth 3 (three) times daily. 270 tablet 3   Respiratory Therapy Supplies (NEBULIZER/TUBING/MOUTHPIECE) KIT Use as directed 10 kit 11   rosuvastatin  (CRESTOR ) 5 MG tablet Take 1 tablet (5 mg total) by mouth daily. 90 tablet 1   solifenacin  (VESICARE ) 5 MG tablet Take 1 tablet (5 mg total) by mouth daily. 30 tablet 2   TYLENOL  500 MG tablet Take 500-1,000 mg by mouth every 6 (six) hours as needed for mild pain (pain score 1-3) or headache.     Vitamin D , Ergocalciferol , (DRISDOL ) 1.25 MG (50000 UNIT) CAPS capsule Take 50,000 Units by mouth every Wednesday.     ziprasidone  (GEODON ) 60 MG capsule Take 60 mg by mouth at bedtime.     No facility-administered medications prior to visit.     Review of Systems:   Constitutional:   No  weight loss, night sweats,  Fevers, chills, +fatigue, or  lassitude.  HEENT:   No headaches,  Difficulty swallowing,  Tooth/dental problems, or  Sore throat,                No sneezing, itching, ear ache, nasal congestion, post nasal drip,   CV:  No chest pain,  Orthopnea, PND, swelling in lower extremities, anasarca, dizziness, palpitations, syncope.    GI  No heartburn, indigestion, abdominal pain, nausea, vomiting, diarrhea, change in bowel habits, loss of appetite, bloody stools.   Resp:    No chest wall deformity  Skin: no rash or lesions.  GU: no dysuria, change in color of  urine, no urgency or frequency.  No flank pain, no hematuria   MS:  No joint pain or swelling.  No decreased range of motion.  No back pain.    Physical Exam  BP 132/78   Pulse 84   Temp 97.8 F (36.6 C) (Oral)   Ht 5' 3.5 (1.613 m)   Wt 186 lb 3.2 oz (84.5 kg)   LMP 10/29/2023 (Approximate)   SpO2 99% Comment: on 2L continuous (tank)  BMI 32.47 kg/m   GEN: A/Ox3; pleasant , NAD, well nourished    HEENT:  [place]/AT,   , NOSE-clear, THROAT-clear, no lesions, no postnasal drip or exudate noted. Poor dentition  NECK:  Supple w/ fair ROM; no JVD; normal carotid impulses w/o bruits; no thyromegaly or nodules palpated; no lymphadenopathy.    RESP  Clear  P & A; w/o, wheezes/ rales/ or rhonchi. no accessory muscle use, no dullness to percussion  CARD:  RRR, no m/r/g, no peripheral edema, pulses intact, no cyanosis or clubbing.  GI:   Soft & nt; nml bowel sounds; no organomegaly or masses detected.   Musco: Warm bil, no deformities or joint swelling noted.   Neuro: alert, no focal deficits noted.    Skin: Warm, no lesions or rashes    Lab Results:  CBC  ProBNP No results found for: PROBNP  Imaging: DG Chest Port 1 View Result Date: 12/11/2023 CLINICAL DATA:  Follow-up pneumonia EXAM: PORTABLE CHEST 1 VIEW COMPARISON:  12/10/2023 FINDINGS: Artifact overlies the chest. Widespread hazy/patchy infiltrates are slightly improved since yesterday. No worsening or new finding. No evidence of lobar collapse or pleural effusion. IMPRESSION: Slight improvement in widespread hazy/patchy infiltrates since yesterday. Electronically Signed   By: Oneil Officer M.D.   On: 12/11/2023 10:28   DG Chest Port 1 View Result Date: 12/10/2023 CLINICAL DATA:   Respiratory failure.  Pneumonia. EXAM: PORTABLE CHEST 1 VIEW COMPARISON:  12/08/2023 FINDINGS: The cardio pericardial silhouette is enlarged. Interval progression of diffuse bilateral airspace disease, now more prominent in the upper lungs. No substantial pleural effusion. No acute bony abnormality. Telemetry leads overlie the chest. IMPRESSION: Interval progression of diffuse bilateral airspace disease, now more prominent in the upper lungs. Findings compatible with diffuse infection or edema. Electronically Signed   By: Camellia Candle M.D.   On: 12/10/2023 08:32   DG CHEST PORT 1 VIEW Result Date: 12/08/2023 EXAM: 1 VIEW XRAY OF THE CHEST 12/08/2023 10:06:00 AM COMPARISON: Plain film and CT of yesterday. CLINICAL HISTORY: [id number] Acute respiratory failure with hypoxia (HCC) [id number]. Acute respiratory failure with hypoxia; unable to remove bra due to multiple lines. Acute respiratory failure with hypoxia; unable to remove bra due to multiple lines. FINDINGS: LUNGS AND PLEURA: Lower lung predominant, bilateral interstitial and airspace disease is minimally improved. Primary area of improvement is the upper lobes. No new pulmonary opacity. No pleural effusion. No pneumothorax. HEART AND MEDIASTINUM: No acute abnormality of the cardiac and mediastinal silhouettes. BONES AND SOFT TISSUES: No acute osseous abnormality. Multiple wires and leads project over the chest on the frontal radiograph. IMPRESSION: 1. Lower lung predominant, bilateral interstitial and airspace disease, minimally improved, with primary improvement in the upper lobes. No new pulmonary opacity. Electronically signed by: Rockey Kilts MD 12/08/2023 11:03 AM EDT RP Workstation: [id number]   CT CHEST WO CONTRAST Result Date: 12/07/2023 CLINICAL DATA:  Respiratory illness, nondiagnostic xray ? ARDS. EXAM: CT CHEST WITHOUT CONTRAST TECHNIQUE: Multidetector CT imaging of the chest was performed following the standard protocol without IV contrast.  RADIATION DOSE REDUCTION: This exam was performed according to the departmental dose-optimization program which includes automated exposure control, adjustment of the mA and/or kV according to patient size and/or use of iterative reconstruction technique. COMPARISON:  CT scan chest from 08/03/2023. FINDINGS: Cardiovascular: Normal cardiac size. No pericardial effusion. No aortic aneurysm. There are coronary artery calcifications, in keeping with coronary artery disease. Mediastinum/Nodes: Visualized thyroid gland appears grossly unremarkable. No solid / cystic mediastinal masses. The esophagus is nondistended precluding optimal assessment. Redemonstration of multiple mildly enlarged mediastinal lymph nodes with largest in the precarinal location measuring up to 1.3 x 2.4 cm. These are indeterminate in etiology but appears grossly unchanged since the prior study. No axillary lymphadenopathy by size criteria. Evaluation of bilateral hila is limited due to lack on intravenous contrast: however, no large hilar lymphadenopathy identified. Lungs/Pleura: The central tracheo-bronchial tree is patent. There are diffuse ground-glass opacities throughout bilateral lungs with superimposed interlobular and interlobular septal thickening, compatible with crazy paving pattern. There are also associated superimposed areas of solid lung opacification bilaterally more in the dependent portions. This is nonspecific and differential diagnosis includes ARDS, multilobar pneumonia, pulmonary alveolar proteinosis, pulmonary edema, etc. Correlate clinically. No suspicious mass. No pleural effusion or pneumothorax. Evaluation for discrete lung nodule is limited due to extensive background lung parenchymal opacities. Upper Abdomen: Visualized upper abdominal viscera within normal limits. Musculoskeletal: There is an oval approximately 8 x 30 mm right retroareolar soft tissue nodule, incompletely characterized on the CT scan exam but grossly  similar to the prior study. There is also an additional approximately 8 x 16 mm soft tissue attenuation nodule centered in the skin/subcutaneous tissue over the right lower anterior chest wall (series 2, image 106), incompletely characterized on the current exam but favored to represent epidermal inclusion cyst. Correlate with physical examination. The visualized soft tissues of the chest wall are otherwise grossly unremarkable. No suspicious osseous lesions. IMPRESSION: 1. There are diffuse ground-glass opacities throughout bilateral lungs with superimposed interlobular and interlobular septal thickening, compatible with crazy paving pattern. There are also associated superimposed areas of solid lung opacification bilaterally more in the dependent portions. This is nonspecific and differential diagnosis includes ARDS, multilobar pneumonia, pulmonary alveolar proteinosis, pulmonary edema, etc. 2. Multiple other nonacute observations, as described above. Aortic Atherosclerosis (ICD10-I70.0). Electronically Signed   By: Ree Molt M.D.   On: 12/07/2023 11:34   DG CHEST PORT 1 VIEW Result Date: 12/07/2023 CLINICAL DATA:  Acute respiratory failure EXAM: PORTABLE CHEST 1 VIEW COMPARISON:  12/06/2023 FINDINGS: Cardiac shadow is within normal limits. Increasing airspace opacity is noted bilaterally. No sizable effusion is noted. No bony abnormality is seen. IMPRESSION: Worsening airspace opacity bilaterally. Electronically Signed   By: Oneil Devonshire M.D.   On: 12/07/2023 10:43   DG CHEST PORT 1 VIEW Result Date: 12/06/2023 EXAM: 1 VIEW XRAY OF THE CHEST 12/06/2023 08:09:03 AM COMPARISON: None available. CLINICAL HISTORY: Hypoxia, patient with parainfluenza virus; Pt is a smoker, hx of Asthma. FINDINGS: LUNGS AND PLEURA: Patchy interstitial airspace opacities are superimposed on chronic changes of COPD. Findings are consistent with a viral pneumonitis. No pleural effusion. No pneumothorax. HEART AND MEDIASTINUM: No  acute abnormality of the cardiac and mediastinal silhouettes. BONES AND SOFT TISSUES: No acute osseous abnormality. IMPRESSION: 1. Findings consistent with viral pneumonitis, superimposed on chronic changes of COPD. Electronically signed by: Lonni Necessary MD 12/06/2023 11:35 AM EDT RP Workstation: [id number]   DG Chest Portable 1 View Result Date: 12/05/2023 CLINICAL DATA:  Chest pain EXAM: PORTABLE CHEST  1 VIEW COMPARISON:  11/03/2023 FINDINGS: Heart and mediastinal contours within normal limits. Right lung clear. Patchy left mid and lower lung opacities could reflect early infiltrate. No effusions. No acute bony abnormality. IMPRESSION: Patchy left mid and lower lung opacities concerning for pneumonia. Electronically Signed   By: Franky Crease M.D.   On: 12/05/2023 00:14    Administration History     None           No data to display          Lab Results  Component Value Date   NITRICOXIDE 5 01/26/2018        Assessment & Plan:   No problem-specific Assessment & Plan notes found for this encounter.   Assessment and Plan Assessment & Plan Asthma exacerbation secondary to parainfluenza infection   Severe asthma exacerbation triggered by a parainfluenza virus infection led to hospitalization and ICU admission. Transition from Advair to Trelegy for better control due to the severity of the recent exacerbation. Discontinue Advair and start Trelegy once daily. Provide Trelegy samples and send a prescription to [facility] in [place]. Continue using DuoNeb as needed.  Close follow-up in 2 weeks as planned  Recent pneumonia with ongoing recovery   Recent pneumonia required hospitalization and treatment with antibiotics and steroids. She is currently in the recovery phase. Order a follow-up chest x-ray in 2 weeks to assess resolution. Encourage rest and fluid intake to aid recovery.  Hypoxemia requiring supplemental oxygen   Hypoxemia secondary to recent pneumonia requires  supplemental oxygen therapy. She is currently on 1-2 liters of oxygen at home. Perform a walk test to assess current oxygen requirements. Maintain oxygen therapy at 2l/m  liters  oxygen saturation levels adequate. Reassess oxygen needs at a follow-up visit  with the goal of weaning off oxygen.  Late add : Walk test  QUALIFYING WALK 12/18/2023   SATURATION QUALIFICATIONS: (This note is used to comply with regulatory documentation for home oxygen)   Patient Saturations on Room Air at Rest = 97%   Patient Saturations on Room Air while Ambulating = 88%   Patient Saturations on 2L Liters of oxygen while Ambulating = 96%   Please briefly explain why patient needs home oxygen: Patient needs 2L continuous oxygen while ambulating to keep SaO2 at or above 88%-90%.  Suspected obstructive sleep apnea   Suspected obstructive sleep apnea due to symptoms of snoring and daytime sleepiness, exacerbated by recent illness and hypoxemia. Schedule a sleep study at [facility] in approximately four to six weeks. Advise delaying the sleep study if scheduled earlier to allow for recovery from recent illness.  Plan  Patient Instructions  Change Advair to Trelegy 200 1 puff daily  Mucinex  DM Twice daily  As needed  Cough/cogestion .  Tessalon  Three times a day  As needed  cough .  Great job not smoking .  Follow up for dentist as discussed.  Set up for Split night sleep study.  Continue on Oxygen 2l/m  Check chest xray on return  Follow up with Dr. Geronimo as planned and As needed   Please contact office for sooner follow up if symptoms do not improve or worsen or seek emergency care       Madelin Stank, NP 12/18/2023

## 2023-12-21 ENCOUNTER — Other Ambulatory Visit: Payer: Self-pay | Admitting: Family Medicine

## 2023-12-21 DIAGNOSIS — E039 Hypothyroidism, unspecified: Secondary | ICD-10-CM

## 2023-12-22 ENCOUNTER — Telehealth (HOSPITAL_BASED_OUTPATIENT_CLINIC_OR_DEPARTMENT_OTHER): Payer: Self-pay

## 2023-12-22 NOTE — Telephone Encounter (Unsigned)
 Copied from CRM [id number]. Topic: Referral - Prior Authorization Question >> Dec 18, 2023  2:44 PM Lavanda D wrote: Reason for CRM: Patient is calling to see if there is any update on the PA for her Fluticasone -Umeclidin-Vilant (TRELEGY ELLIPTA ) 200-62.5-25 MCG/ACT AEPB [[id number]]. Please call if any updates are currently available.

## 2023-12-23 ENCOUNTER — Ambulatory Visit: Payer: Self-pay | Admitting: Internal Medicine

## 2023-12-23 ENCOUNTER — Other Ambulatory Visit (HOSPITAL_BASED_OUTPATIENT_CLINIC_OR_DEPARTMENT_OTHER): Payer: Self-pay

## 2023-12-23 ENCOUNTER — Other Ambulatory Visit (HOSPITAL_COMMUNITY): Payer: Self-pay

## 2023-12-23 ENCOUNTER — Telehealth: Payer: Self-pay

## 2023-12-23 DIAGNOSIS — R0602 Shortness of breath: Secondary | ICD-10-CM

## 2023-12-23 DIAGNOSIS — J42 Unspecified chronic bronchitis: Secondary | ICD-10-CM

## 2023-12-23 NOTE — Telephone Encounter (Signed)
 PA Case: [id number], Status: Approved, Coverage Starts on: 12/23/2023 12:00:00 AM, Coverage Ends on: 12/22/2024 12:00:00 AM.

## 2023-12-23 NOTE — Telephone Encounter (Signed)
 [name]?: FYI only for provider.  Patient is followed in Pulmonology for lung nodules, last seen on 12/18/2023 by Parrett, Emily RAMAN, NP.  Called Nurse Triage reporting Chest Pain.  Symptoms began yesterday.  Interventions attempted: Rescue inhaler, Maintenance inhaler, Nebulizer treatments, and Home oxygen use.  Symptoms are: stable.  Triage Disposition: See PCP When Office is Open (Within 3 Days)  Patient/caregiver understands and will follow disposition?: Yes                             Copied from CRM [id number]. Topic: Clinical - Red Word Triage >> Dec 23, 2023 12:44 PM Corean SAUNDERS wrote: Red Word that prompted transfer to Nurse Triage: Chest tightness Reason for Disposition  Nursing judgment  Answer Assessment - Initial Assessment Questions E2C2 Pulmonary Triage - Initial Assessment Questions   Have you used any OTC meds to help with symptoms? If yes, ask What medications? Denies   Have you used your inhalers/maintenance medication? If yes, What medications? Advair 1 x daily and albuterol  3 x daily- states 3 x daily is baseline   Do you wear supplemental oxygen? If yes, How many liters are you supposed to use? 1L   What is your usual oxygen saturation reading?  (Note: Pulmonary O2 sats should be 90% or greater) 94% today, 93-95% is normal for her     1. LOCATION: Where does it hurt?       Middle of chest/sternum  2. RADIATION: Does the pain go anywhere else? (e.g., into neck, jaw, arms, back)     Denies radiation  3. ONSET: When did the chest pain begin? (Minutes, hours or days)      Last night 4. PATTERN: Does the pain come and go, or has it been constant since it started?  Does it get worse with exertion?      States tightness comes and goes, denies worsening upon exertion  5. DURATION: How long does it last (e.g., seconds, minutes, hours)     States tightness will stay for about an hour at a  time  6. SEVERITY: How bad is the pain?  (e.g., Scale 1-10; mild, moderate, or severe)     Mild discomfort across chest, requesting chest x-ray, denies true pain/pressure 7. CARDIAC RISK FACTORS: Do you have any history of heart problems or risk factors for heart disease? (e.g., angina, prior heart attack; diabetes, high blood pressure, high cholesterol, smoker, or strong family history of heart disease)     Denies 8. PULMONARY RISK FACTORS: Do you have any history of lung disease?  (e.g., blood clots in lung, asthma, emphysema, birth control pills)     Lung nodules 9. CAUSE: What do you think is causing the chest pain?     States she was recently sick with pneumonia and influenza and is experiencing residual chest tightness  10. OTHER SYMPTOMS: Do you have any other symptoms? (e.g., dizziness, nausea, vomiting, sweating, fever, difficulty breathing, cough)       Denies difficulty breathing, denies pain when breathing, denies fever, denies cough, denies NV, denies dizziness, patient able to speak in clear and complete sentences while on phone with this RN  Protocols used: Chest Pain-A-AH, No Guideline or Reference Available-A-AH

## 2023-12-23 NOTE — Progress Notes (Deleted)
 OV 12/23/2023  Subjective:  Patient ID: Emily [name], female , DOB: 1980/06/15 , age 43 y.o. , MRN: [id number] , ADDRESS: [address] [place] [address] PCP [name], NP Patient Care Team: [name], NP as PCP - General (Family Medicine) [place], [facility] Geronimo Amel, MD as Consulting Physician (Pulmonary Disease) Nahser, Aleene PARAS, MD (Inactive) as Consulting Physician (Cardiology) Lavona Agent, MD as Consulting Physician (Cardiology) Signa Delon LABOR, NP as Nurse Practitioner (Obstetrics and Gynecology)  This Provider for this visit: Treatment Team:  Attending Provider: Geronimo Amel, MD    12/23/2023 -  No chief complaint on file.    HPI Emily [name] 43 y.o. -    CT Chest data from date: ****  - personally visualized and independently interpreted : *** - my findings are: ***   PFT      No data to display             LAB RESULTS last 96 hours No results found.       has a past medical history of Abnormal CT of liver, Anxiety, Asthma, Asthma, Bipolar 1 disorder (HCC), Bipolar disorder (HCC), Chronic pain, Cirrhosis (HCC), Depression, Diabetes mellitus without complication (HCC), Fatty liver, Gallstones, GERD (gastroesophageal reflux disease), Headache(784.0), Hyperlipidemia, Hypertension, Hypothyroidism, Lupus (systemic lupus erythematosus) (HCC), Pulmonary nodule, Tachycardia, Thyroid disease, and Vitamin D  deficiency.   reports that she quit smoking about 2 weeks ago. Her smoking use included cigarettes. She has never been exposed to tobacco smoke. She has never used smokeless tobacco.  Past Surgical History:  Procedure Laterality Date   DILATION AND CURETTAGE OF UTERUS     1 yr ago   UPPER GI ENDOSCOPY  08/28/2022    Allergies  Allergen Reactions   Bactrim  [Sulfamethoxazole -Trimethoprim ] Anaphylaxis, Shortness Of Breath and Other (See Comments)    Chest  pain, trouble breathing   Ciprofloxacin Palpitations   Clindamycin /Lincomycin Other (See Comments)    Chest Pain   Flagyl  [Metronidazole ] Shortness Of Breath   Hydrocodone Nausea Only   Iodinated Contrast Media Anaphylaxis, Swelling and Other (See Comments)    Body swells, too   Ondansetron Shortness Of Breath   Liraglutide  Nausea And Vomiting and Other (See Comments)    Victoza    Doxycycline  Nausea And Vomiting   Metformin  And Related Other (See Comments)    Tore up my stomach   Penicillins Rash   Synjardy  Xr [Empagliflozin-Metformin  Hcl Er] Rash and Other (See Comments)    Diaper rash    Immunization History  Administered Date(s) Administered   Influenza Split 03/22/2014   Influenza Whole 01/13/2008   Influenza, Quadrivalent, Recombinant, Inj, Pf 01/23/2022   Influenza, Seasonal, Injecte, Preservative Fre 04/18/2015   Influenza,inj,Quad PF,6+ Mos 04/18/2015, 01/08/2022   PFIZER(Purple Top)SARS-COV-2 Vaccination 03/12/2020, 04/16/2020   Pneumococcal Polysaccharide-23 05/10/2012   Tdap 01/02/2006, 08/08/2015    Family History  Problem Relation Age of Onset   Asthma Mother    Valvular heart disease Father        Mitral valve repair Dr. Dusty 2007   Diabetes Maternal Grandmother    Hypertension Maternal Grandmother      Current Outpatient Medications:    ACCU-CHEK AVIVA PLUS test strip, 1 each by Other route 3 (three) times daily., Disp: , Rfl:    ACCU-CHEK FASTCLIX LANCETS MISC, USE AS DIRECTED, Disp: 102 each, Rfl: 3   albuterol  (VENTOLIN  HFA) 108 (90 Base) MCG/ACT inhaler, Inhale 2 puffs into the lungs every  6 (six) hours as needed for wheezing or shortness of breath., Disp: 18 g, Rfl: 3   amLODipine  (NORVASC ) 10 MG tablet, Take 1 tablet (10 mg total) by mouth daily., Disp: 60 tablet, Rfl: 0   benzonatate  (TESSALON ) 100 MG capsule, Take 1 capsule (100 mg total) by mouth 3 (three) times daily as needed for cough., Disp: 30 capsule, Rfl: 0   Blood Glucose Monitoring  Suppl (ACCU-CHEK AVIVA PLUS) w/Device KIT, USE AS DIRECTED, Disp: 1 kit, Rfl: 0   Blood Glucose Monitoring Suppl DEVI, 1 each by Does not apply route in the morning, at noon, and at bedtime. May substitute to any manufacturer covered by patient's insurance., Disp: 1 each, Rfl: 0   clonazePAM  (KLONOPIN ) 1 MG tablet, Take 1 mg by mouth 2 (two) times daily as needed for anxiety., Disp: , Rfl:    Continuous Glucose Receiver (DEXCOM G7 RECEIVER) DEVI, 1 Device by Does not apply route continuous., Disp: 1 each, Rfl: 0   Continuous Glucose Sensor (DEXCOM G7 SENSOR) MISC, USE AS DIRECTED, Disp: 9 each, Rfl: 0   cyanocobalamin  1000 MCG tablet, Take 1 tablet (1,000 mcg total) by mouth daily. (Patient taking differently: Take 1 tablet (1,000 mcg total) by mouth daily.), Disp: 60 tablet, Rfl: 0   famotidine  (PEPCID ) 20 MG tablet, Take 20 mg by mouth 2 (two) times daily., Disp: , Rfl:    Fluticasone -Umeclidin-Vilant (TRELEGY ELLIPTA ) 200-62.5-25 MCG/ACT AEPB, Inhale 1 puff into the lungs daily., Disp: 1 each, Rfl: 5   folic acid  (FOLVITE ) 1 MG tablet, Take 1 tablet (1 mg total) by mouth daily., Disp: 30 tablet, Rfl: 0   insulin  glargine (LANTUS  SOLOSTAR) 100 UNIT/ML Solostar Pen, Inject 40 Units into the skin daily., Disp: 25 mL, Rfl: 0   insulin  lispro (HUMALOG  KWIKPEN) 100 UNIT/ML KwikPen, Inject 12 Units into the skin 3 (three) times daily., Disp: 25 mL, Rfl: 0   Insulin  Pen Needle 32G X 4 MM MISC, 1 Device by Does not apply route in the morning, at noon, in the evening, and at bedtime., Disp: 100 each, Rfl: 0   ipratropium (ATROVENT ) 0.03 % nasal spray, Place 2 sprays into both nostrils every 12 (twelve) hours., Disp: 30 mL, Rfl: 0   ipratropium-albuterol  (DUONEB) 0.5-2.5 (3) MG/3ML SOLN, Take 3 mLs by nebulization in the morning, at noon, and at bedtime., Disp: 270 mL, Rfl: 5   lamoTRIgine  (LAMICTAL ) 150 MG tablet, Take 150 mg by mouth at bedtime., Disp: , Rfl:    levothyroxine  (SYNTHROID ) 75 MCG tablet, TAKE  1 TABLET BY MOUTH EVERY DAY BEFORE BREAKFAST, Disp: 90 tablet, Rfl: 0   montelukast  (SINGULAIR ) 10 MG tablet, TAKE 1 TABLET BY MOUTH EVERYDAY AT BEDTIME, Disp: 90 tablet, Rfl: 3   Nebulizers (COMPRESSOR/NEBULIZER) MISC, Use as directed, Disp: 1 each, Rfl: 0   nicotine  (NICODERM CQ  - DOSED IN MG/24 HOURS) 21 mg/24hr patch, Place 1 patch (21 mg total) onto the skin daily., Disp: 28 patch, Rfl: 0   nitroGLYCERIN  (NITROSTAT ) 0.4 MG SL tablet, Place 1 tablet (0.4 mg total) under the tongue every 5 (five) minutes as needed for chest pain. (Patient taking differently: Place 1 tablet (0.4 mg total) under the tongue every 5 (five) minutes as needed for chest pain.), Disp: 25 tablet, Rfl: 3   PARoxetine  (PAXIL ) 10 MG tablet, Take 10 mg by mouth in the morning., Disp: , Rfl:    promethazine -dextromethorphan  (PROMETHAZINE -DM) 6.25-15 MG/5ML syrup, Take 5 mLs by mouth 4 (four) times daily as needed., Disp: 118 mL, Rfl:  0   propranolol  (INDERAL ) 20 MG tablet, Take 1 tablet (20 mg total) by mouth 3 (three) times daily., Disp: 270 tablet, Rfl: 3   Respiratory Therapy Supplies (NEBULIZER/TUBING/MOUTHPIECE) KIT, Use as directed, Disp: 10 kit, Rfl: 11   rosuvastatin  (CRESTOR ) 5 MG tablet, Take 1 tablet (5 mg total) by mouth daily., Disp: 90 tablet, Rfl: 1   solifenacin  (VESICARE ) 5 MG tablet, Take 1 tablet (5 mg total) by mouth daily., Disp: 30 tablet, Rfl: 2   TYLENOL  500 MG tablet, Take 500-1,000 mg by mouth every 6 (six) hours as needed for mild pain (pain score 1-3) or headache., Disp: , Rfl:    Vitamin D , Ergocalciferol , (DRISDOL ) 1.25 MG (50000 UNIT) CAPS capsule, Take 50,000 Units by mouth every Wednesday., Disp: , Rfl:    ziprasidone  (GEODON ) 60 MG capsule, Take 60 mg by mouth at bedtime., Disp: , Rfl:       Objective:   There were no vitals filed for this visit.  Estimated body mass index is 32.47 kg/m as calculated from the following:   Height as of 12/18/23: 5' 3.5 (1.613 m).   Weight as of 12/18/23:  186 lb 3.2 oz (84.5 kg).  @WEIGHTCHANGE @  There were no vitals filed for this visit.   Physical Exam   General: No distress. *** O2 at rest: *** Cane present: *** Sitting in wheel chair: *** Frail: *** Obese: *** Neuro: Alert and Oriented x 3. GCS 15. Speech normal Psych: Pleasant Resp:  Barrel Chest - ***.  Wheeze - ***, Crackles - ***, No overt respiratory distress CVS: Normal heart sounds. Murmurs - *** Ext: Stigmata of Connective Tissue Disease - *** HEENT: Normal upper airway. PEERL +. No post nasal drip        Assessment/     Assessment & Plan    PLAN There are no Patient Instructions on file for this visit.    FOLLOWUP    No follow-ups on file.    SIGNATURE    Dr. Dorethia Cave, M.D., F.C.C.P,  Pulmonary and Critical Care Medicine Staff Physician, [facility] Center Director - Interstitial Lung Disease  Program  Pulmonary Fibrosis [facility] [place], [place], [address]  Pager: [phone], If no answer or between  15:00h - 7:00h: call [phone] Telephone: [phone]  5:20 PM 12/23/2023   Moderate Complexity MDM OFFICE  2021 E/M guidelines, first released in 2021, with minor revisions added in 2023 and 2024 Must meet the requirements for 2 out of 3 dimensions to qualify.    Number and complexity of problems addressed Amount and/or complexity of data reviewed Risk of complications and/or morbidity  One or more chronic illness with mild exacerbation, OR progression, OR  side effects of treatment  Two or more stable chronic illnesses  One undiagnosed new problem with uncertain prognosis  One acute illness with systemic symptoms   One Acute complicated injury Must meet the requirements for 1 of 3 of the categories)  Category 1: Tests and documents, historian  Any combination of 3 of the following:  Assessment requiring an independent historian  Review of prior external note(s)  from each unique source  Review of results of each unique test  Ordering of each unique test    Category 2: Interpretation of tests   Independent interpretation of a test performed by another physician/other qualified health care professional (not separately reported)  Category 3: Discuss management/tests  Discussion of management or  test interpretation with external physician/other qualified health care professional/appropriate source (not separately reported) Moderate risk of morbidity from additional diagnostic testing or treatment Examples only:  Prescription drug management  Decision regarding minor surgery with identfied patient or procedure risk factors  Decision regarding elective major surgery without identified patient or procedure risk factors  Diagnosis or treatment significantly limited by social determinants of health             HIGh Complexity  OFFICE   2021 E/M guidelines, first released in 2021, with minor revisions added in 2023. Must meet the requirements for 2 out of 3 dimensions to qualify.    Number and complexity of problems addressed Amount and/or complexity of data reviewed Risk of complications and/or morbidity  Severe exacerbation of chronic illness  Acute or chronic illnesses that may pose a threat to life or bodily function, e.g., multiple trauma, acute MI, pulmonary embolus, severe respiratory distress, progressive rheumatoid arthritis, psychiatric illness with potential threat to self or others, peritonitis, acute renal failure, abrupt change in neurological status Must meet the requirements for 2 of 3 of the categories)  Category 1: Tests and documents, historian  Any combination of 3 of the following:  Assessment requiring an independent historian  Review of prior external note(s) from each unique source  Review of results of each unique test  Ordering of each unique test    Category 2: Interpretation of tests     Independent interpretation of a test performed by another physician/other qualified health care professional (not separately reported)  Category 3: Discuss management/tests  Discussion of management or test interpretation with external physician/other qualified health care professional/appropriate source (not separately reported)  HIGH risk of morbidity from additional diagnostic testing or treatment Examples only:  Drug therapy requiring intensive monitoring for toxicity  Decision for elective major surgery with identified pateint or procedure risk factors  Decision regarding hospitalization or escalation of level of care  Decision for DNR or to de-escalate care   Parenteral controlled  substances            LEGEND - Independent interpretation involves the interpretation of a test for which there is a CPT code, and an interpretation or report is customary. When a review and interpretation of a test is performed and documented by the provider, but not separately reported (billed), then this would represent an independent interpretation. This report does not need to conform to the usual standards of a complete report of the test. This does not include interpretation of tests that do not have formal reports such as a complete blood count with differential and blood cultures. Examples would include reviewing a chest radiograph and documenting in the medical record an interpretation, but not separately reporting (billing) the interpretation of the chest radiograph.   An appropriate source includes professionals who are not health care professionals but may be involved in the management of the patient, such as a Clinical research associate, upper officer, case manager or teacher, and does not include discussion with family or informal caregivers.    - SDOH: SDOH are the conditions in the environments where people are born, live, learn, work, play, worship, and age that affect a wide range of health,  functioning, and quality-of-life outcomes and risks. (e.g., housing, food insecurity, transportation, etc.). SDOH-related Z codes ranging from Z55-Z65 are the ICD-10-CM diagnosis codes used to document SDOH data Z55 - Problems related to education and literacy Z56 - Problems related to employment and unemployment Z57 - Occupational exposure to risk factors  Z58 - Problems related to physical environment Z59 - Problems related to housing and economic circumstances [phone] - Problems related to social environment [phone] - Problems related to upbringing [phone] - Other problems related to primary support group, including family circumstances Z68 - Problems related to certain psychosocial circumstances Z65 - Problems related to other psychosocial circumstances

## 2023-12-23 NOTE — Telephone Encounter (Signed)
*  Pulm  Pharmacy Patient Advocate Encounter   Received notification from Pt Calls Messages that prior authorization for Trelegy is required/requested.   Insurance verification completed.   The patient is insured through [employer] [facility] .   Per test claim: PA required; PA submitted to above mentioned insurance via Latent Key/confirmation #/EOC [id number] Status is pending

## 2023-12-24 ENCOUNTER — Ambulatory Visit: Admitting: Internal Medicine

## 2023-12-24 ENCOUNTER — Telehealth: Payer: Self-pay | Admitting: Adult Health

## 2023-12-24 NOTE — Telephone Encounter (Signed)
 Referral sent to [facility] for DME. This is the information I received back, which is needed to complete the urgent order. Thank you.   Emily [name], Emily [name], Emily [name], Emily [name]; 1 other Received, We will need sats co-signed please.

## 2023-12-24 NOTE — Telephone Encounter (Signed)
 Walk test placed in chart per DME /insurance requirement with signature .

## 2023-12-25 ENCOUNTER — Other Ambulatory Visit: Payer: Self-pay | Admitting: Family Medicine

## 2023-12-25 DIAGNOSIS — E039 Hypothyroidism, unspecified: Secondary | ICD-10-CM

## 2023-12-25 DIAGNOSIS — K602 Anal fissure, unspecified: Secondary | ICD-10-CM | POA: Diagnosis not present

## 2023-12-25 DIAGNOSIS — F3170 Bipolar disorder, currently in remission, most recent episode unspecified: Secondary | ICD-10-CM | POA: Diagnosis not present

## 2023-12-25 DIAGNOSIS — F411 Generalized anxiety disorder: Secondary | ICD-10-CM | POA: Diagnosis not present

## 2023-12-25 DIAGNOSIS — I10 Essential (primary) hypertension: Secondary | ICD-10-CM | POA: Diagnosis not present

## 2023-12-25 DIAGNOSIS — F4312 Post-traumatic stress disorder, chronic: Secondary | ICD-10-CM | POA: Diagnosis not present

## 2023-12-25 DIAGNOSIS — F410 Panic disorder [episodic paroxysmal anxiety] without agoraphobia: Secondary | ICD-10-CM | POA: Diagnosis not present

## 2023-12-25 DIAGNOSIS — K6289 Other specified diseases of anus and rectum: Secondary | ICD-10-CM | POA: Diagnosis not present

## 2023-12-25 NOTE — Telephone Encounter (Signed)
 Received confirmation from [employer] that the signature was received. Nothing further is needed. Closing out the referral.

## 2023-12-25 NOTE — Telephone Encounter (Signed)
 Thank You. I sent this to [facility]. Awaiting confirmation that nothing else is needed, can close the referral. I will provide an update once I have receive confirmation.

## 2023-12-29 DIAGNOSIS — B348 Other viral infections of unspecified site: Secondary | ICD-10-CM | POA: Diagnosis not present

## 2023-12-29 DIAGNOSIS — J129 Viral pneumonia, unspecified: Secondary | ICD-10-CM | POA: Diagnosis not present

## 2023-12-30 ENCOUNTER — Ambulatory Visit: Admitting: "Endocrinology

## 2024-01-01 ENCOUNTER — Encounter: Payer: Self-pay | Admitting: Internal Medicine

## 2024-01-01 ENCOUNTER — Ambulatory Visit: Admitting: Internal Medicine

## 2024-01-01 ENCOUNTER — Ambulatory Visit

## 2024-01-01 ENCOUNTER — Telehealth: Payer: Self-pay

## 2024-01-01 VITALS — BP 102/68 | HR 81 | Temp 98.2°F | Ht 63.5 in | Wt 193.8 lb

## 2024-01-01 DIAGNOSIS — R918 Other nonspecific abnormal finding of lung field: Secondary | ICD-10-CM | POA: Diagnosis not present

## 2024-01-01 DIAGNOSIS — R0683 Snoring: Secondary | ICD-10-CM

## 2024-01-01 DIAGNOSIS — Z8709 Personal history of other diseases of the respiratory system: Secondary | ICD-10-CM

## 2024-01-01 DIAGNOSIS — J4540 Moderate persistent asthma, uncomplicated: Secondary | ICD-10-CM

## 2024-01-01 DIAGNOSIS — B348 Other viral infections of unspecified site: Secondary | ICD-10-CM

## 2024-01-01 DIAGNOSIS — Z87891 Personal history of nicotine dependence: Secondary | ICD-10-CM | POA: Diagnosis not present

## 2024-01-01 NOTE — Progress Notes (Signed)
 .   12/18/2023 Post hospital follow up    43 year old female heavy smoking history followed for chronic asthmatic bronchitis Medical history significant for diabetes, bipolar disorder and panic disorder  Discussed the use of AI scribe software for clinical note transcription with the patient, who gave verbal consent to proceed.  History of Present Illness Emily [name] is a 43 year old female with asthma who presents for a post-hospital follow-up after a viral illness exacerbated her condition. She is accompanied by her mother, who is also her primary caregiver.  She was recently admitted with prolonged hospitalization with severe sepsis secondary to parainfluenza virus and pneumonia along with acute respiratory failure.   During her hospital stay, she required oxygen therapy and was treated with BiPAP in the ICU. She was administered antibiotics and steroids, which have since been completed. She was discharged on four liters of oxygen, which she has reduced to one liter at home, occasionally increasing to two liters as needed.  Walk test today in office shows patient needs 2 L of oxygen to maintain O2 saturations greater than 88 to 90%. CT chest showed diffuse groundglass opacities bilaterally and solid lung opacification in the dependent portions. She continues to use her Advair inhaler twice daily for asthma management and has a DuoNeb nebulizer for use as needed. She has quit smoking.  She reports good appetite and improved nutrition since her hospitalization. She has been taking vitamin D  and B supplements.  There is a concern for sleep apnea due to low oxygen levels during sleep and symptoms such as snoring and daytime sleepiness. Her mother has been assisting her with medication management and ensuring she uses her oxygen therapy correctly. She has been staying with her mother since her discharge to ensure her safety and proper care, as her oxygen levels have been dropping during  sleep.  TEST/EVENTS :  Labs: 2020 :  Ace - 81 Esr - 30 Rh<14 CCP<16 ANCA - negative  Hypersensitive activity pneumonitis panel negative Anti-Jo 1 less than 0.2 RNP antibodies greater than 8 Sjogren's syndrome antibodies negative Anti-scleroderma antibody less than 1 Aldolase 4.5 MPO/PR-3 (ANCA) antibodies-less than 1 Total CK 47, CK-MB less than 7 -negative     04/01/2018-CT chest high resolution- few patchy areas of mild groundglass attenuation in the lungs bilaterally nonspecific, stable pulmonary nodules from 2009    feNO - 01/26/2018 - 5 ppbd and is normal    01/08/2015- spirometry- FVC 1.33 (38% predicted), ratio 72, FEV1 1.01 (37% predicted)   High-resolution CT chest July 25, 2020 bandlike scarring left base, no evidence of interstitial lung disease, mild air trapping, fine centrilobular nodularity concentrated in the lung apices consistent with smoking-related bronchiolitis. Neg for ILD    OV 01/01/2024  Subjective:  Patient ID: Emily [name], female , DOB: 1981-03-19 , age 43 y.o. , MRN: [id number] , ADDRESS: [address] [place] [address] PCP [name], NP Patient Care Team: [name], NP as PCP - General (Family Medicine) Drexel, [facility] Geronimo Amel, MD as Consulting Physician (Pulmonary Disease) Nahser, Aleene PARAS, MD (Inactive) as Consulting Physician (Cardiology) Lavona Agent, MD as Consulting Physician (Cardiology) Signa Delon LABOR, NP as Nurse Practitioner (Obstetrics and Gynecology)  This Provider for this visit: Treatment Team:  Attending Provider: Geronimo Amel, MD    01/01/2024 -   Chief Complaint  Patient presents with   Follow-up    Hospital (12/04/23 - 12/13/23) for Pneumonia & Sepsis Pt  states she is doing well since hospital stay, and that she would like to get off her oxygen.      HPI Emily [name] 43 y.o. -hospitalized mid August 2025 with parainfluenza  virus 3 ARDS and respiratory failure.  Required BiPAP nearly got intubated.  Then saw [facility] as above for follow-up and was improving.  But still needed oxygen she was desaturating with exertion.  She is here for further follow-up.  At this point in time she says she is using 1 L at rest 1-1/2 L with exertion.  Mother and father with her.  Mother and father do give independent history saying she is improving.  She wants to come off the oxygen.  We exerted her today and she did not desaturate at all on room air.  Her smoking is in remission.  She continues inhalers for asthma.  She is awaiting a split-night sleep study ordered by nurse practitioner [name].    Simple office walk 224 (66+46 x 2) feet Pod A at [facility] x  3 laps goal with forehead probe 01/01/2024    O2 used ra   Number laps completed 3   Comments about pace good   Resting Pulse Ox/HR 95% and 86/min   Final Pulse Ox/HR 95% and 99/min   Desaturated </= 88% no   Desaturated <= 3% points no   Got Tachycardic >/= 90/min yes   Symptoms at end of test no   Miscellaneous comments improved       has a past medical history of Abnormal CT of liver, Anxiety, Asthma, Asthma, Bipolar 1 disorder (HCC), Bipolar disorder (HCC), Chronic pain, Cirrhosis (HCC), Depression, Diabetes mellitus without complication (HCC), Fatty liver, Gallstones, GERD (gastroesophageal reflux disease), Headache(784.0), Hyperlipidemia, Hypertension, Hypothyroidism, Lupus (systemic lupus erythematosus) (HCC), Pulmonary nodule, Tachycardia, Thyroid disease, and Vitamin D  deficiency.   reports that she quit smoking about 4 weeks ago. Her smoking use included cigarettes. She has never been exposed to tobacco smoke. She has never used smokeless tobacco.  Past Surgical History:  Procedure Laterality Date   DILATION AND CURETTAGE OF UTERUS     1 yr ago   UPPER GI ENDOSCOPY  08/28/2022    Allergies  Allergen Reactions   Bactrim  [Sulfamethoxazole -Trimethoprim ]  Anaphylaxis, Shortness Of Breath and Other (See Comments)    Chest pain, trouble breathing   Ciprofloxacin Palpitations   Clindamycin /Lincomycin Other (See Comments)    Chest Pain   Flagyl  [Metronidazole ] Shortness Of Breath   Hydrocodone Nausea Only   Iodinated Contrast Media Anaphylaxis, Swelling and Other (See Comments)    Body swells, too   Ondansetron Shortness Of Breath   Liraglutide  Nausea And Vomiting and Other (See Comments)    Victoza    Doxycycline  Nausea And Vomiting   Metformin  And Related Other (See Comments)    Tore up my stomach   Penicillins Rash   Synjardy  Xr [Empagliflozin-Metformin  Hcl Er] Rash and Other (See Comments)    Diaper rash    Immunization History  Administered Date(s) Administered   Influenza Split 03/22/2014   Influenza Whole 01/13/2008   Influenza, Quadrivalent, Recombinant, Inj, Pf 01/23/2022   Influenza, Seasonal, Injecte, Preservative Fre 04/18/2015   Influenza,inj,Quad PF,6+ Mos 04/18/2015, 01/08/2022   PFIZER(Purple Top)SARS-COV-2 Vaccination 03/12/2020, 04/16/2020   Pneumococcal Polysaccharide-23 05/10/2012   Tdap 01/02/2006, 08/08/2015    Family History  Problem Relation Age of Onset   Asthma Mother    Valvular heart disease Father        Mitral valve repair Dr. Dusty  2007   Diabetes Maternal Grandmother    Hypertension Maternal Grandmother      Current Outpatient Medications:    ACCU-CHEK AVIVA PLUS test strip, 1 each by Other route 3 (three) times daily., Disp: , Rfl:    ACCU-CHEK FASTCLIX LANCETS MISC, USE AS DIRECTED, Disp: 102 each, Rfl: 3   albuterol  (VENTOLIN  HFA) 108 (90 Base) MCG/ACT inhaler, Inhale 2 puffs into the lungs every 6 (six) hours as needed for wheezing or shortness of breath., Disp: 18 g, Rfl: 3   amLODipine  (NORVASC ) 10 MG tablet, Take 1 tablet (10 mg total) by mouth daily., Disp: 60 tablet, Rfl: 0   Blood Glucose Monitoring Suppl (ACCU-CHEK AVIVA PLUS) w/Device KIT, USE AS DIRECTED, Disp: 1 kit, Rfl: 0    Blood Glucose Monitoring Suppl DEVI, 1 each by Does not apply route in the morning, at noon, and at bedtime. May substitute to any manufacturer covered by patient's insurance., Disp: 1 each, Rfl: 0   clonazePAM  (KLONOPIN ) 1 MG tablet, Take 1 mg by mouth 2 (two) times daily as needed for anxiety., Disp: , Rfl:    cyanocobalamin  1000 MCG tablet, Take 1 tablet (1,000 mcg total) by mouth daily. (Patient taking differently: Take 1 tablet (1,000 mcg total) by mouth daily.), Disp: 60 tablet, Rfl: 0   famotidine  (PEPCID ) 20 MG tablet, Take 20 mg by mouth 2 (two) times daily., Disp: , Rfl:    folic acid  (FOLVITE ) 1 MG tablet, Take 1 tablet (1 mg total) by mouth daily., Disp: 30 tablet, Rfl: 0   insulin  glargine (LANTUS  SOLOSTAR) 100 UNIT/ML Solostar Pen, Inject 40 Units into the skin daily., Disp: 25 mL, Rfl: 0   insulin  lispro (HUMALOG  KWIKPEN) 100 UNIT/ML KwikPen, Inject 12 Units into the skin 3 (three) times daily., Disp: 25 mL, Rfl: 0   Insulin  Pen Needle 32G X 4 MM MISC, 1 Device by Does not apply route in the morning, at noon, in the evening, and at bedtime., Disp: 100 each, Rfl: 0   ipratropium (ATROVENT ) 0.03 % nasal spray, Place 2 sprays into both nostrils every 12 (twelve) hours., Disp: 30 mL, Rfl: 0   ipratropium-albuterol  (DUONEB) 0.5-2.5 (3) MG/3ML SOLN, Take 3 mLs by nebulization in the morning, at noon, and at bedtime., Disp: 270 mL, Rfl: 5   lamoTRIgine  (LAMICTAL ) 150 MG tablet, Take 150 mg by mouth at bedtime., Disp: , Rfl:    levothyroxine  (SYNTHROID ) 75 MCG tablet, TAKE 1 TABLET BY MOUTH EVERY DAY BEFORE BREAKFAST, Disp: 90 tablet, Rfl: 1   montelukast  (SINGULAIR ) 10 MG tablet, TAKE 1 TABLET BY MOUTH EVERYDAY AT BEDTIME, Disp: 90 tablet, Rfl: 3   Nebulizers (COMPRESSOR/NEBULIZER) MISC, Use as directed, Disp: 1 each, Rfl: 0   nicotine  (NICODERM CQ  - DOSED IN MG/24 HOURS) 21 mg/24hr patch, Place 1 patch (21 mg total) onto the skin daily., Disp: 28 patch, Rfl: 0   nitroGLYCERIN  (NITROSTAT ) 0.4  MG SL tablet, Place 1 tablet (0.4 mg total) under the tongue every 5 (five) minutes as needed for chest pain. (Patient taking differently: Place 1 tablet (0.4 mg total) under the tongue every 5 (five) minutes as needed for chest pain.), Disp: 25 tablet, Rfl: 3   PARoxetine  (PAXIL ) 10 MG tablet, Take 10 mg by mouth in the morning., Disp: , Rfl:    promethazine -dextromethorphan  (PROMETHAZINE -DM) 6.25-15 MG/5ML syrup, Take 5 mLs by mouth 4 (four) times daily as needed., Disp: 118 mL, Rfl: 0   propranolol  (INDERAL ) 20 MG tablet, Take 1 tablet (20 mg total) by  mouth 3 (three) times daily., Disp: 270 tablet, Rfl: 3   Respiratory Therapy Supplies (NEBULIZER/TUBING/MOUTHPIECE) KIT, Use as directed, Disp: 10 kit, Rfl: 11   rosuvastatin  (CRESTOR ) 5 MG tablet, Take 1 tablet (5 mg total) by mouth daily., Disp: 90 tablet, Rfl: 1   solifenacin  (VESICARE ) 5 MG tablet, Take 1 tablet (5 mg total) by mouth daily., Disp: 30 tablet, Rfl: 2   TYLENOL  500 MG tablet, Take 500-1,000 mg by mouth every 6 (six) hours as needed for mild pain (pain score 1-3) or headache., Disp: , Rfl:    Vitamin D , Ergocalciferol , (DRISDOL ) 1.25 MG (50000 UNIT) CAPS capsule, Take 50,000 Units by mouth every Wednesday., Disp: , Rfl:    ziprasidone  (GEODON ) 60 MG capsule, Take 60 mg by mouth at bedtime., Disp: , Rfl:    benzonatate  (TESSALON ) 100 MG capsule, Take 1 capsule (100 mg total) by mouth 3 (three) times daily as needed for cough. (Patient not taking: Reported on 01/01/2024), Disp: 30 capsule, Rfl: 0   Continuous Glucose Receiver (DEXCOM G7 RECEIVER) DEVI, 1 Device by Does not apply route continuous. (Patient not taking: Reported on 01/01/2024), Disp: 1 each, Rfl: 0   Continuous Glucose Sensor (DEXCOM G7 SENSOR) MISC, USE AS DIRECTED (Patient not taking: Reported on 01/01/2024), Disp: 9 each, Rfl: 0   Fluticasone -Umeclidin-Vilant (TRELEGY ELLIPTA ) 200-62.5-25 MCG/ACT AEPB, Inhale 1 puff into the lungs daily. (Patient not taking: Reported on  01/01/2024), Disp: 1 each, Rfl: 5      Objective:   Vitals:   01/01/24 1024  BP: 102/68  Pulse: 81  Temp: 98.2 F (36.8 C)  SpO2: 97%  Weight: 193 lb 12.8 oz (87.9 kg)  Height: 5' 3.5 (1.613 m)    Estimated body mass index is 33.79 kg/m as calculated from the following:   Height as of this encounter: 5' 3.5 (1.613 m).   Weight as of this encounter: 193 lb 12.8 oz (87.9 kg).  @WEIGHTCHANGE @  [employer]   01/01/24 1024  Weight: 193 lb 12.8 oz (87.9 kg)     Physical Exam   General: No distress. Looks well O2 at rest: yes buyt no longer needs it Cane present: no Sitting in wheel chair: no Frail: no Obese: o Neuro: Alert and Oriented x 3. GCS 15. Speech normal Psych: Pleasant Resp:  Barrel Chest - no.  Wheeze - no, Crackles - no, No overt respiratory distress CVS: Normal heart sounds. Murmurs - no Ext: Stigmata of Connective Tissue Disease - no HEENT: Normal upper airway. PEERL +. No post nasal drip        Assessment/     Assessment & Plan History of acute respiratory failure  History of acute respiratory distress syndrome (ARDS)  Infection due to parainfluenza virus 3  Snoring  Quit smoking  Moderate persistent asthma without complication    PLAN Patient Instructions     ICD-10-CM   1. History of acute respiratory failure  Z87.09     2. History of acute respiratory distress syndrome (ARDS)  Z87.09     3. Infection due to parainfluenza virus 3  B34.8     4. Snoring  R06.83     5. Quit smoking  Z87.891     6. Moderate persistent asthma without complication  J45.40      #History of ARDS and respiratory failure  -0 significantly improved.  You are no longer desaturating with exertion  Plan - You can return your portable oxygen and you do not need it for any distance up  to 600 feet - Get chest x-ray today  #History of asthma  - This currently well-controlled  Plan - Continue your baseline bronchodilator treatment  #History  of smoking  Plan - Smoking status in remission congratulations  #Snoring  Plan - Continue with sleep study as ordered by time.  #History of nodules  Plan  - Timing of further CT scans to be addressed by Madelin Passy at follow-up  Follow-up - 3 months with Madelin Passy    FOLLOWUP    Return in about 3 months (around 04/01/2024) for with [name] Parrett.    SIGNATURE    Dr. Dorethia Cave, M.D., F.C.C.P,  Pulmonary and Critical Care Medicine Staff Physician, [facility] Director - Interstitial Lung Disease  Program  Pulmonary Fibrosis [facility] at [facility] [place], [place], [address]  Pager: [phone], If no answer or between  15:00h - 7:00h: call [phone] Telephone: [phone]  11:06 AM 01/01/2024

## 2024-01-01 NOTE — Patient Instructions (Addendum)
 ICD-10-CM   1. History of acute respiratory failure  Z87.09     2. History of acute respiratory distress syndrome (ARDS)  Z87.09     3. Infection due to parainfluenza virus 3  B34.8     4. Snoring  R06.83     5. Quit smoking  Z87.891     6. Moderate persistent asthma without complication  J45.40      #History of ARDS and respiratory failure  -0 significantly improved.  You are no longer desaturating with exertion  Plan - You can return your portable oxygen and you do not need it for any distance up to 600 feet - Get chest x-ray today  #History of asthma  - This currently well-controlled  Plan - Continue your baseline bronchodilator treatment  #History of smoking  Plan - Smoking status in remission congratulations  #Snoring  Plan - Continue with sleep study as ordered by time.  #History of nodules  Plan  - Timing of further CT scans to be addressed by Emily [name] at follow-up  Follow-up - 3 months with Emily [name]

## 2024-01-01 NOTE — Telephone Encounter (Signed)
 Pt requested advice on continuing Lasix . Dr. Geronimo stated that he would like to see the chest xray before advising. Routing to MR.

## 2024-01-04 ENCOUNTER — Telehealth: Payer: Self-pay

## 2024-01-04 NOTE — Telephone Encounter (Signed)
 Copied from CRM [id number]. Topic: General - Other >> Jan 04, 2024  2:22 PM [place] S wrote: Reason for CRM: patient calling back because she still havent received a message or callback.  Reason for CRM: patient would like to speak with a nurse about if she should keep her oxygen level at 83 at night until her sleep study and she needs her xray results.  Called and spoke to patient. She stated that her oxygen level drops at night. Last night it dropped to 86%. She is wanting to know if she can keep her oxygen until her sleep study. She wears 1L PRN.  She is aware that CXR has not been read by radiologist yet.  Dr. Geronimo, please advise. Thanks

## 2024-01-05 ENCOUNTER — Encounter: Payer: Self-pay | Admitting: Adult Health

## 2024-01-05 ENCOUNTER — Ambulatory Visit: Admitting: Adult Health

## 2024-01-05 ENCOUNTER — Telehealth: Payer: Self-pay | Admitting: *Deleted

## 2024-01-05 ENCOUNTER — Telehealth: Payer: Self-pay

## 2024-01-05 VITALS — BP 109/74 | HR 80 | Ht 63.5 in | Wt 195.0 lb

## 2024-01-05 DIAGNOSIS — N644 Mastodynia: Secondary | ICD-10-CM | POA: Diagnosis not present

## 2024-01-05 DIAGNOSIS — N61 Mastitis without abscess: Secondary | ICD-10-CM | POA: Diagnosis not present

## 2024-01-05 DIAGNOSIS — Z8709 Personal history of other diseases of the respiratory system: Secondary | ICD-10-CM

## 2024-01-05 DIAGNOSIS — L02229 Furuncle of trunk, unspecified: Secondary | ICD-10-CM

## 2024-01-05 MED ORDER — DOXYCYCLINE HYCLATE 100 MG PO TABS
100.0000 mg | ORAL_TABLET | Freq: Two times a day (BID) | ORAL | 0 refills | Status: DC
Start: 2024-01-05 — End: 2024-01-19

## 2024-01-05 MED ORDER — PROMETHAZINE HCL 12.5 MG PO TABS: 12.5000 mg | ORAL_TABLET | Freq: Four times a day (QID) | ORAL | 1 refills | Status: AC | PRN

## 2024-01-05 NOTE — Telephone Encounter (Signed)
 Copied from CRM [id number]. Topic: Clinical - Lab/Test Results >> Jan 05, 2024 11:13 AM Hamdi H wrote: Reason for CRM: Patient calling about her X-Ray results. States she still hasn't received a call for the results. Patient wants to know how long this will take.  I called and spoke to pt. I informed pt that she just had this done 4 days ago and the results can take up to 1-2 weeks before we can review them with her. I informed pt that someone from our office will contact her once her pulmonologist receives them and advises further. Pt verbalized understanding. NFN

## 2024-01-05 NOTE — Progress Notes (Signed)
 Subjective:     Patient ID: Emily [name], female   DOB: 02-18-1981, 43 y.o.   MRN: [id number]  HPI Charla is a 43 year old white female,single, G1P0010, in complaining of right breast tenderness and redness. She had a mammogram and US  11/25/23 and has known cluster of cysts and epidermal cyst right breast. She was in the hospital in August for parainfluenza and pneumonia with acute respiratory failure, on oxygen and BIPAP.     Component Value Date/Time   DIAGPAP (A) 07/30/2022 1120    - Atypical squamous cells of undetermined significance (ASC-US )   HPVHIGH Positive (A) 07/30/2022 1120   ADEQPAP  07/30/2022 1120    Satisfactory for evaluation; transformation zone component PRESENT.   PCP is JINNY Slade NP  Review of Systems +right breast tenderness and redness. Reviewed past medical,surgical, social and family history. Reviewed medications and allergies.     Objective:   Physical Exam BP 109/74 (BP Location: Right Arm, Patient Position: Sitting, Cuff Size: Normal)   Pulse 80   Ht 5' 3.5 (1.613 m)   Wt 195 lb (88.5 kg)   LMP 12/16/2023 (Approximate)   BMI 34.00 kg/m      Skin warm and dry,  Breasts:no dominate palpable mass, retraction or nipple discharge on the left, it is tender she says, on the right has thickness in areola at 10-12 o'clock and it is red and tender, no retraction or nipple discharge, this is in are of know epidermal cyst. There is a 2 cm purple boil on trunk, just below bra line, it looks like MRSA. Discussed with Dr Jayne, as she has long list of allergies. Fall risk is low  Upstream - 01/05/24 1350       Pregnancy Intention Screening   Does the patient want to become pregnant in the next year? No    Does the patient's partner want to become pregnant in the next year? No    Would the patient like to discuss contraceptive options today? No      Contraception Wrap Up   Current Method Abstinence    End Method Abstinence    Contraception Counseling  Provided No          Assessment:     1. Breast tenderness +breast tenderness   2. Boil of trunk (Primary) 2 cm purple boil on trunk, just below bra line, it looks like MRSA. Use warm compress, do not squeeze Will rx doxycyline 100 mg 1 bid x 10 days Will rx phenergan  to take before taking doxycycline    Meds ordered this encounter  Medications   doxycycline  (VIBRA -TABS) 100 MG tablet    Sig: Take 1 tablet (100 mg total) by mouth 2 (two) times daily.    Dispense:  20 tablet    Refill:  0    Supervising Provider:   JAYNE MINDER H [[id number]]   promethazine  (PHENERGAN ) 12.5 MG tablet    Sig: Take 1 tablet (12.5 mg total) by mouth every 6 (six) hours as needed.    Dispense:  30 tablet    Refill:  1    Supervising Provider:   JAYNE, LUTHER H [[id number]]    3. Mastitis, acute  Right breast has thickness in areola at 10-12 o'clock and it is red and tender, no retraction or nipple discharge, this is in are of know epidermal cyst.  Can apply warm compress   Will rx doxycycline  100 mg 1 bid x 10 days If persists will refer to general surgery  Plan:     Return 01/19/24 as scheduled for pap and physical

## 2024-01-05 NOTE — Telephone Encounter (Signed)
 Duplicate message Pt informed delay on resulting of x-ray, once received she will be given a call NFN

## 2024-01-05 NOTE — Telephone Encounter (Signed)
 Copied from CRM [id number]. Topic: General - Other >> Jan 04, 2024 11:33 AM Shona S wrote: Reason for CRM: patient would like to speak with a nurse about if she should keep her oxygen level at 83 at night until her sleep study and she needs her xray results.   I already called pt regarding her cxr. I informed pt that her o2 levels should not be that low. Pt is checking her levels with a pulse ox and states that she wakes up during the night and checks her levels. Pt's levels are fine during the day. Pt states her sleep study is not scheduled until October 2025 and she is not sure what she should do for her levels until then. Pt states she was on o2 before but is no longer on o2.  Please advise.

## 2024-01-05 NOTE — Telephone Encounter (Signed)
 I ordre ONO on room air

## 2024-01-05 NOTE — Telephone Encounter (Signed)
   Cdxr from 01/01/24 stil pending  Plan  - ok to use night time o2 1L  -a wait cXr result

## 2024-01-06 NOTE — Telephone Encounter (Signed)
 I called and spoke to pt. Pt informed. NFN

## 2024-01-06 NOTE — Telephone Encounter (Signed)
 I called and spoke to pt. Pt informed of Dr Reeves note and verbalized understanding. NFN

## 2024-01-07 ENCOUNTER — Other Ambulatory Visit: Payer: Self-pay | Admitting: Adult Health

## 2024-01-07 ENCOUNTER — Ambulatory Visit: Payer: Self-pay | Admitting: Internal Medicine

## 2024-01-07 NOTE — Telephone Encounter (Signed)
Please advise on x ray results

## 2024-01-07 NOTE — Telephone Encounter (Unsigned)
 Copied from CRM [id number]. Topic: Clinical - Lab/Test Results >> Jan 07, 2024  1:54 PM Isabell A wrote: Reason for CRM: Patient is calling stating she is anxious about her results - would like to discuss her x-ray results with Dr.Ramaswamy - advised her results have came back today & she will receive a phone call once reviewed by provider.    Callback number: [phone]

## 2024-01-07 NOTE — Telephone Encounter (Signed)
 Cxr lung s arehealing really well . She just needs to do lasix  as needed depending on leg sweelling. Ight take some months for cxr to return to normal

## 2024-01-07 NOTE — Telephone Encounter (Signed)
 [name]?: Action required by provider: lab or test result follow-up needed.  Patient is followed in Pulmonology for chronic asthmatic bronchitis, last seen on 01/01/2024 by Geronimo Amel, MD.  Called Nurse Triage reporting Results.  Triage Disposition: Call PCP Within 24 Hours  Patient/caregiver understands and will follow disposition?: Yes       Copied from CRM [id number]. Topic: Clinical - Lab/Test Results >> Jan 07, 2024  1:54 PM Isabell A wrote: Reason for CRM: Patient is calling stating she is anxious about her results - would like to discuss her x-ray results with Dr.Ramaswamy - advised her results have came back today & she will receive a phone call once reviewed by provider.    Callback number: [phone] >> Jan 07, 2024  3:57 PM Joesph PARAS wrote: Patient is calling to ASK WHY SHE HAS NOT HEARD BACK ABOUT HER RESULTS YET.        Reason for Disposition  Caller requesting lab results  (Exception: Routine or non-urgent lab result.)  Answer Assessment - Initial Assessment Questions Patient calling for x-ray results and would like more information about them. I advised that once Dr. Geronimo has reviewed the results someone would reach out to her.     1. REASON FOR CALL or QUESTION: What is your reason for calling today? or How can I best     X-ray results from 9/12 2. CALLER: Document the source of call. (e.g., laboratory staff, caregiver or patient).     Patient  Protocols used: PCP Call - No Triage-A-AH

## 2024-01-07 NOTE — Telephone Encounter (Signed)
 Looks like this was handled in patient calls

## 2024-01-08 ENCOUNTER — Ambulatory Visit: Admitting: Adult Health

## 2024-01-08 ENCOUNTER — Ambulatory Visit: Payer: Self-pay | Admitting: Internal Medicine

## 2024-01-08 ENCOUNTER — Telehealth: Payer: Self-pay

## 2024-01-08 NOTE — Telephone Encounter (Signed)
 Seen pulmonology note for this

## 2024-01-08 NOTE — Telephone Encounter (Signed)
 Copied from CRM #[id number]. Topic: Clinical - Lab/Test Results >> Jan 08, 2024  2:38 PM Nathanel DEL wrote: Reason for CRM: pt calling back for chest xray results

## 2024-01-08 NOTE — Telephone Encounter (Signed)
 [name]?: Action required by provider: clinical question for provider.  Patient is followed in Pulmonology for asthma, lung nodules, last seen on 01/01/2024 by Geronimo Amel, MD.  Called Nurse Triage reporting Advice Only.  Symptoms began information only call.  Interventions attempted: Other: information only call.  Symptoms are: information only call.  Triage Disposition: Call PCP When Office is Open  Patient/caregiver understands and will follow disposition?: Yes                             Copied from CRM #[id number]. Topic: Clinical - Lab/Test Results >> Jan 08, 2024  2:53 PM Nathanel DEL wrote: Reason for CRM: pt states she got a call about her chest Xray results.  Unable to reach a nurse,CAL has gone for the day. Held for NT several minutes.  Advised pt I would have a nurse call her back. Pt was agreeable w/ that Reason for Disposition  [1] Caller requesting NON-URGENT health information AND [2] PCP's office is the best resource  Answer Assessment - Initial Assessment Questions 1. REASON FOR CALL: What is the main reason for your call? or How can I best help you?     Patient called in requesting results from chest x-ray. This RN read provider's note in chart to patient. Patient would like to know who is supposed to prescribed her the lasix . Please advise.  Protocols used: Information Only Call - No Triage-A-AH

## 2024-01-11 ENCOUNTER — Telehealth: Payer: Self-pay

## 2024-01-11 NOTE — Telephone Encounter (Signed)
 Patient had a scheduled video visit with Madelin Stank NP on 01/08/2024, she cancelled the appointment.  Dr. Geronimo had sent a detailed mychart message to her explaining the results of her CXR.    ATC patient x1.  LVM to return call.  Will await return call.

## 2024-01-11 NOTE — Telephone Encounter (Signed)
 Copied from CRM [id number]. Topic: Clinical - Lab/Test Results >> Jan 11, 2024 10:30 AM Russell PARAS wrote: Reason for CRM:   Pt is returning call from [facility], to review chest x-ray results. Contacted CAL, and Alexis advised to send CRM. Pt disconnected call while holding.   Request call back   CB#  [phone]   Spoke w/ pt she has read results in mychart    -NFN

## 2024-01-12 ENCOUNTER — Inpatient Hospital Stay: Admitting: Family Medicine

## 2024-01-12 NOTE — Telephone Encounter (Signed)
 Spoke w/ pt she has read results in mychart     -NFN        Electronically signed by Kijania-Swaringen, Kelly R, CMA at 01/11/2024  2:06 PM

## 2024-01-12 NOTE — Telephone Encounter (Signed)
Nicotine patch is OTC

## 2024-01-13 DIAGNOSIS — J129 Viral pneumonia, unspecified: Secondary | ICD-10-CM | POA: Diagnosis not present

## 2024-01-13 DIAGNOSIS — B348 Other viral infections of unspecified site: Secondary | ICD-10-CM | POA: Diagnosis not present

## 2024-01-13 NOTE — Telephone Encounter (Signed)
 Spoke with the pt and Gastro Dr sent her rx for nicotine  patch. Nfn

## 2024-01-15 ENCOUNTER — Telehealth: Payer: Self-pay | Admitting: Adult Health

## 2024-01-15 ENCOUNTER — Ambulatory Visit: Admitting: "Endocrinology

## 2024-01-15 DIAGNOSIS — R9389 Abnormal findings on diagnostic imaging of other specified body structures: Secondary | ICD-10-CM | POA: Diagnosis not present

## 2024-01-15 NOTE — Telephone Encounter (Signed)
 Copied from CRM [id number]. Topic: Medical Record Request - Provider/Facility Request >> Jan 15, 2024  1:32 PM Chantha C wrote: Reason for CRM: Ronal from [facility] [phone] states can speaking with any precert nurse needing clinical information for sleep symptoms related issues for patient from NP, Parrett. Please advise and needs to be called back today, if not it will go to medical director review. Ronal will fax the information to the office. Also, Ronal asked for further request to have the office fax number so she does not want to call to get the fax information. Please advise and call back.

## 2024-01-18 NOTE — Telephone Encounter (Signed)
 Pt had been advised the following regarding her Lasix  on 01/08/24:  Chest x-ray: lungs are healing really well . She just needs to do lasix  as needed depending on leg sweelling. Might take some months for chest x-ray to return to normal  NFN

## 2024-01-19 ENCOUNTER — Other Ambulatory Visit (HOSPITAL_COMMUNITY)
Admission: RE | Admit: 2024-01-19 | Discharge: 2024-01-19 | Disposition: A | Discharge status: Home / Self Care | Source: Ambulatory Visit | Attending: Adult Health | Admitting: Adult Health

## 2024-01-19 ENCOUNTER — Ambulatory Visit (INDEPENDENT_AMBULATORY_CARE_PROVIDER_SITE_OTHER): Admitting: Adult Health

## 2024-01-19 ENCOUNTER — Encounter: Payer: Self-pay | Admitting: Adult Health

## 2024-01-19 VITALS — BP 116/79 | HR 62 | Ht 63.0 in | Wt 189.0 lb

## 2024-01-19 DIAGNOSIS — Z1331 Encounter for screening for depression: Secondary | ICD-10-CM | POA: Diagnosis not present

## 2024-01-19 DIAGNOSIS — Z01419 Encounter for gynecological examination (general) (routine) without abnormal findings: Secondary | ICD-10-CM | POA: Insufficient documentation

## 2024-01-19 DIAGNOSIS — Z1151 Encounter for screening for human papillomavirus (HPV): Secondary | ICD-10-CM

## 2024-01-19 DIAGNOSIS — N644 Mastodynia: Secondary | ICD-10-CM | POA: Diagnosis not present

## 2024-01-19 DIAGNOSIS — D239 Other benign neoplasm of skin, unspecified: Secondary | ICD-10-CM | POA: Diagnosis not present

## 2024-01-19 DIAGNOSIS — L02229 Furuncle of trunk, unspecified: Secondary | ICD-10-CM | POA: Diagnosis not present

## 2024-01-19 DIAGNOSIS — N6081 Other benign mammary dysplasias of right breast: Secondary | ICD-10-CM

## 2024-01-19 DIAGNOSIS — Z8742 Personal history of other diseases of the female genital tract: Secondary | ICD-10-CM

## 2024-01-19 NOTE — Addendum Note (Signed)
 Addended by: ILEAN RUTHERFORD HERO on: 01/19/2024 03:23 PM   Modules accepted: Orders

## 2024-01-19 NOTE — Progress Notes (Signed)
 Patient ID: Emily [name], female   DOB: 1980-12-01, 43 y.o.   MRN: [id number] History of Present Illness: Emily [name] is a 43 year old white female,single, G1P0010, in for a well woman gyn exam and pap. She has finished antibiotics(doxycycline  100 mg 1 bid x 10 days) for boil and mastitis. 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  PCP is JINNY Slade NP   Current Medications, Allergies, Past Medical History, Past Surgical History, Family History and Social History were reviewed in [employer] record.     Review of Systems: Patient denies any headaches, hearing loss, fatigue, blurred vision, shortness of breath, chest pain, abdominal pain, problems with bowel movements, urination, or intercourse(not active). No joint pain or mood swings.  Right breast still tender Has lesion on liver she says    Physical Exam:BP 116/79 (BP Location: Right Arm, Patient Position: Sitting, Cuff Size: Normal)   Pulse 62   Ht 5' 3 (1.6 m)   Wt 189 lb (85.7 kg)   LMP 01/12/2024 (Approximate)   BMI 33.48 kg/m   General:  Well developed, well nourished, no acute distress Skin:  Warm and dry Neck:  Midline trachea, normal thyroid, good ROM, no lymphadenopathy Lungs; Clear to auscultation bilaterally Breast:  No dominant palpable mass, retraction, or nipple discharge on the left, on the right, no retraction or nipple discharge, there is thickness in areola 10-12 0'clock, not as red but still tender, has know sebaceous/epidermal cyst Cardiovascular: Regular rate and rhythm Abdomen:  Soft, non tender, no hepatosplenomegaly, boil has resolved on right upper trunk, just below bra line.  Pelvic:  External genitalia is normal in appearance, has multiple angiokeratomas.  The vagina is normal in appearance. Urethra has no lesions or masses. The cervix is everted at os and friable with EC brush, pap with HR HPV  genotyping performed.  Uterus is felt to be normal size, shape, and contour.  No adnexal masses or tenderness noted.Bladder is non tender, no masses felt. Rectal: Deferred Extremities/musculoskeletal:  No swelling or varicosities noted, no clubbing or cyanosis Psych:  No mood changes, alert and cooperative,seems happy AA is 0 Fall risk is low    01/19/2024    1:37 PM 12/15/2023    2:30 PM 06/15/2023    4:53 PM  Depression screen PHQ 2/9  Decreased Interest 0 0   Down, Depressed, Hopeless 0 0   PHQ - 2 Score 0 0   Altered sleeping 0    Tired, decreased energy 0    Change in appetite 0    Feeling bad or failure about yourself  0    Trouble concentrating 0    Moving slowly or fidgety/restless 0    Suicidal thoughts 0    PHQ-9 Score 0    Difficult doing work/chores        Information is confidential and restricted. Go to Review Flowsheets to unlock data.       01/19/2024    1:37 PM 02/16/2023    3:57 PM 12/01/2022    1:58 PM 09/24/2022    2:00 PM  GAD 7 : Generalized Anxiety Score  Nervous, Anxious, on Edge 0 0 0 0  Control/stop worrying 0 0 0 0  Worry too much - different things 0 0 0 0  Trouble relaxing 0 0 0 0  Restless 0 0 0 0  Easily annoyed or irritable 0 0 1 0  Afraid - awful might happen 0 0 0 0  Total GAD 7 Score 0 0 1 0  Anxiety Difficulty   Not difficult at all Not difficult at all      Upstream - 01/19/24 1336       Pregnancy Intention Screening   Does the patient want to become pregnant in the next year? No    Does the patient's partner want to become pregnant in the next year? No    Would the patient like to discuss contraceptive options today? No      Contraception Wrap Up   Current Method Abstinence    End Method Abstinence    Contraception Counseling Provided No         Examination chaperoned by Tish RN   Impression and plan: 1. Encounter for gynecological examination with Papanicolaou smear of cervix (Primary) Pap sent Physical in 1 year Labs  with PCP Mammogram was 11/25/23 at [facility] had epidermal cyst and and cluster of cysts on right   2. Angiokeratoma  3. Boil of trunk Resolved   4. Breast tenderness More so right nipple area Referred to Dr Mavis - Ambulatory referral to General Surgery  5. Sebaceous cyst of breast, right\ Referred to Dr Mavis - Ambulatory referral to General Surgery  6. History of abnormal cervical Pap smear Pap sent

## 2024-01-19 NOTE — Telephone Encounter (Signed)
 I tried calling Ronal back with [facility]. I was on hold for 10 minutes and could not wait any longer due to having patient clinic.   Pt saw Madelin Stank, NP on 12-18-23 for snoring, and a split night study is scheduled for 02-08-24.

## 2024-01-21 ENCOUNTER — Telehealth: Payer: Self-pay

## 2024-01-21 NOTE — Telephone Encounter (Signed)
 Copied from CRM [id number]. Topic: General - Call Back - No Documentation >> Jan 19, 2024  4:26 PM Devaughn RAMAN wrote: Reason for CRM: Patient returning phone call, patient unsure what call is in regards too. Contacted [facility] [place] to speak with Sayan Aldava, no answer. Advised patient a follow up call would be made. Patient was thankful and verbalized understanding.  I called and spoke to pt. I informed pt that I did call her by mistake but it was in regards to her. I informed her that I tried calling [facility] with [facility] but I was unable to reach her after being on hold for 10 minutes and if there was any way she could reach [facility], then to just have her call us  back so we can discuss what she needed. Pt verbalized understanding. NFN

## 2024-01-21 NOTE — Telephone Encounter (Signed)
 Copied from CRM [id number]. Topic: General - Other >> Jan 19, 2024  2:13 PM Whitney O wrote: Reason for CRM: patient returning call from the clinic . She was at a doctor appointment . Calling cal now and they are taking a look . Thersia told me to send a message back clinical . Please give patient a call back  Duplicate.

## 2024-01-22 ENCOUNTER — Other Ambulatory Visit: Payer: Self-pay | Admitting: Adult Health

## 2024-01-22 LAB — CYTOLOGY - PAP
Comment: NEGATIVE
Comment: NEGATIVE
Comment: NEGATIVE
Diagnosis: NEGATIVE
HPV 16: NEGATIVE
HPV 18 / 45: NEGATIVE
High risk HPV: POSITIVE — AB

## 2024-01-22 MED ORDER — DOXYCYCLINE HYCLATE 100 MG PO TABS: 100.0000 mg | ORAL_TABLET | Freq: Two times a day (BID) | ORAL | 0 refills | Status: DC

## 2024-01-22 NOTE — Progress Notes (Signed)
 Refilled doxycycline.

## 2024-01-25 ENCOUNTER — Ambulatory Visit: Payer: Self-pay | Admitting: Adult Health

## 2024-01-25 DIAGNOSIS — R87810 Cervical high risk human papillomavirus (HPV) DNA test positive: Secondary | ICD-10-CM | POA: Insufficient documentation

## 2024-02-01 ENCOUNTER — Ambulatory Visit

## 2024-02-01 ENCOUNTER — Encounter: Payer: Self-pay | Admitting: Internal Medicine

## 2024-02-01 ENCOUNTER — Ambulatory Visit: Payer: Self-pay

## 2024-02-01 ENCOUNTER — Encounter: Payer: Self-pay | Admitting: Cardiology

## 2024-02-01 DIAGNOSIS — E785 Hyperlipidemia, unspecified: Secondary | ICD-10-CM

## 2024-02-01 NOTE — Telephone Encounter (Signed)
  Nurse attempted to reach out to pt: no answer and routing information to PCP office for review  Copied from CRM #[id number]. Topic: Clinical - Red Word Triage >> Feb 01, 2024  4:59 PM Celestine FALCON wrote: Red Word that prompted transfer to Nurse Triage: Pt is having SOB and needs to schedule an appt with Dr. Geronimo in [place] as soon as possible. Pt was in the ED last month for double pneumonia and fluid in lungs. No appt was made.

## 2024-02-01 NOTE — Telephone Encounter (Signed)
  Copied from CRM [id number]. Topic: Clinical - Red Word Triage >> Feb 01, 2024  4:59 PM Celestine FALCON wrote: Red Word that prompted transfer to Nurse Triage: Pt is having SOB and needs to schedule an appt with Dr. Geronimo in [place] as soon as possible. Pt was in the ED last month for double pneumonia and fluid in lungs. No appt was made.

## 2024-02-02 ENCOUNTER — Ambulatory Visit: Admitting: Surgery

## 2024-02-02 NOTE — Telephone Encounter (Signed)
 Copied from CRM [id number]. Topic: Clinical - Medical Advice >> Feb 01, 2024  5:13 PM Emily [name] wrote: Reason for CRM: pt called stated she is having shortness of breath off and on and refused to be triaged stated she just wants an appt with provider or APP  Pt is scheduled to see Landry Ferrari, NP tomorrow. NFN

## 2024-02-02 NOTE — Telephone Encounter (Signed)
 Pcp not at this location

## 2024-02-02 NOTE — Telephone Encounter (Signed)
 Copied from CRM [id number]. Topic: Clinical - Red Word Triage >> Feb 01, 2024  4:59 PM Celestine FALCON wrote: Red Word that prompted transfer to Nurse Triage: Pt is having SOB and needs to schedule an appt with Dr. Geronimo in [place] as soon as possible. Pt was in the ED last month for double pneumonia and fluid in lungs. No appt was made. >> Feb 01, 2024  5:25 PM Rozanna G wrote: I attempted to resend the pt to Nurse Triage she refused stated she is not having shortness of breath right now she just wants to see Provider or APP. Stated she did not want the previous person to send her to the  Triage nurse. I did put her on the schedule to see Mrs. Hope on Wednesday.    Pt is scheduled to see Landry Hope, NP tomorrow., NFN

## 2024-02-03 ENCOUNTER — Ambulatory Visit: Admitting: Primary Care

## 2024-02-03 DIAGNOSIS — R1033 Periumbilical pain: Secondary | ICD-10-CM | POA: Diagnosis not present

## 2024-02-03 DIAGNOSIS — R110 Nausea: Secondary | ICD-10-CM | POA: Diagnosis not present

## 2024-02-03 DIAGNOSIS — R1084 Generalized abdominal pain: Secondary | ICD-10-CM | POA: Diagnosis not present

## 2024-02-03 DIAGNOSIS — I10 Essential (primary) hypertension: Secondary | ICD-10-CM | POA: Diagnosis not present

## 2024-02-08 ENCOUNTER — Telehealth: Payer: Self-pay

## 2024-02-08 ENCOUNTER — Ambulatory Visit (HOSPITAL_BASED_OUTPATIENT_CLINIC_OR_DEPARTMENT_OTHER): Attending: Adult Health | Admitting: Pulmonary Disease

## 2024-02-08 VITALS — Ht 63.5 in | Wt 196.0 lb

## 2024-02-08 DIAGNOSIS — R0683 Snoring: Secondary | ICD-10-CM | POA: Diagnosis not present

## 2024-02-08 DIAGNOSIS — G4733 Obstructive sleep apnea (adult) (pediatric): Secondary | ICD-10-CM | POA: Insufficient documentation

## 2024-02-08 DIAGNOSIS — R1084 Generalized abdominal pain: Secondary | ICD-10-CM | POA: Diagnosis not present

## 2024-02-08 DIAGNOSIS — R110 Nausea: Secondary | ICD-10-CM | POA: Diagnosis not present

## 2024-02-08 DIAGNOSIS — R1033 Periumbilical pain: Secondary | ICD-10-CM | POA: Diagnosis not present

## 2024-02-08 DIAGNOSIS — Z7189 Other specified counseling: Secondary | ICD-10-CM

## 2024-02-08 NOTE — Progress Notes (Signed)
 Complex Care Management Note  Care Guide Note 02/08/2024 Name: Emily [name] MRN: [id number] DOB: October 21, 1980  Emily [name] is a 43 y.o. year old female who sees [name], NP for primary care. I reached out to Emily [name] by phone today to offer complex care management services.  [name] was given information about Complex Care Management services today including:   The Complex Care Management services include support from the care team which includes your Nurse Care Manager, Clinical Social Worker, or Pharmacist.  The Complex Care Management team is here to help remove barriers to the health concerns and goals most important to you. Complex Care Management services are voluntary, and the patient may decline or stop services at any time by request to their care team member.   Complex Care Management Consent Status: Patient agreed to services and verbal consent obtained.   Follow up plan:  Telephone appointment with complex care management team member scheduled for:  02/17/2024  Encounter Outcome:  Patient Scheduled  Jeoffrey Buffalo , RMA     [place]  [facility], [facility] Guide  Direct Dial : [phone]  Website: [email or web address]

## 2024-02-09 ENCOUNTER — Encounter: Payer: Self-pay | Admitting: Internal Medicine

## 2024-02-09 NOTE — Telephone Encounter (Signed)
 Sleep study completed on 02/08/24.  She has an OV with Dr. Geronimo on 10/30.  Closing encounter.  Type Date User Summary Attachment  Prior Authorization 01/22/2024  7:18 AM Nicholaus Hermina BROCKS APPROVED -  Note: Certificate Information Certification Number [id number]   Status CERTIFIED IN TOTAL   Service Information Service Type 89 - Diagnostic Medical   [facility] Service [facility] - [facility]   Service From - To Date 2024-01-11 - 2024-03-10   Level of Service Elective   Diagnosis Code 1 R0683 - Snoring   Procedure Code 1 (CPT/HCPCS) 95811 - POLYSOM 6/>YRS CPAP 4/> PARM   Procedure From - To Date 2024-01-11 - 2024-03-10   Status CERTIFIED IN TOTAL   Message   MEETS CRITERIA/GUIDELINES Procedure Code 2 (CPT/HCPCS) 95810 - POLYSOM 6/> YRS 4/> PARAM   Procedure From - To Date 2024-01-11 - 2024-03-10   Status CERTIFIED IN TOTAL   Message   MEETS CRITERIA/GUIDELINES Procedure Code 3 (CPT/HCPCS) 95811 - POLYSOM 6/>YRS CPAP 4/> PARM   Procedure From - To Date 2024-01-11 - 2024-03-10   Status CERTIFIED IN TOTAL   Message   MEETS CRITERIA/GUIDELINES . Type Date User Summary Attachment  Prior Authorization 01/11/2024 12:45 PM Nicholaus Hermina BROCKS PA SUBMITTED -  Note: Reference Number [id number] . Type Date User Summary Attachment  General 12/18/2023  1:38 PM Linnie Ear Sent to schedule -  Note: Sent to schedule

## 2024-02-10 NOTE — Telephone Encounter (Signed)
 Copied from CRM #[id number]. Topic: General - Other >> Feb 10, 2024  1:33 PM Emily [name] wrote: Reason for CRM: Patient demanding results of sleep study and for provider to read CT send via MyChart. Informed sleep study results take time and message has been sent to provider regarding CT. Patient hung up.   Called and spoke with the pt.  Advised we were awaiting Dr. Geronimo response.   PCC do we have ONO results?

## 2024-02-10 NOTE — Telephone Encounter (Signed)
 Copied from CRM [id number]. Topic: Clinical - Lab/Test Results >> Feb 09, 2024  3:41 PM Essie A wrote: Reason for CRM: Patient had a CT scan done which included her stomach, pelvic and lungs on 02/08/24 at [facility].  She would like for Dr. Geronimo to read them and let her know the results.  Will send the results through MyChart.  Patient had a sleep study done on 02/08/24 and would like the results of that also.  Thanks. >> Feb 09, 2024  4:56 PM Lavanda D wrote: Patient calling again to request her CT results. CT was done yesterday.   Awaiting Dr. Geronimo response.

## 2024-02-10 NOTE — Telephone Encounter (Signed)
**Note De-identified  [place] Obfuscation** Please advise 

## 2024-02-11 ENCOUNTER — Telehealth: Payer: Self-pay | Admitting: *Deleted

## 2024-02-11 ENCOUNTER — Telehealth: Payer: Self-pay

## 2024-02-11 ENCOUNTER — Other Ambulatory Visit

## 2024-02-11 DIAGNOSIS — G4733 Obstructive sleep apnea (adult) (pediatric): Secondary | ICD-10-CM

## 2024-02-11 NOTE — Telephone Encounter (Signed)
 Dr Geronimo, please advise mychart messages.

## 2024-02-11 NOTE — Procedures (Signed)
 [facility] [address] [place], [place] [address] Tel: [phone]   Fax: [phone]  Split Night Interpretation  Patient Name:  Emily [name], Emily [name] Date:  02/08/2024 Referring Physician:  MADELIN PARRETT [phone]) %%startinterp%% Indications for Polysomnography The patient is a 43 year old Female who is 5' 3 and weighs 196.0 lbs.  Her BMI equals 34.3.  A diagnostic polysomnogram was performed to evaluate for excessive daytime somnolence, fatigue, loud snoring, non refreshing sleep.  After 123.0 minutes of sleep time the patient exhibited sufficient respiratory events qualifying her for a CPAP trial which was then initiated.    Medications taken at 2100 & 0051.  ALBUTEROL   CLONAZEPAM   LAMOTRIGINE   GEODON   PROPRANOLOL   SINGULAIR   PHENERGAN    Polysomnogram Data A full night polysomnogram was performed recording the standard physiologic parameters including EEG, EOG, EMG, EKG, nasal and oral airflow.  Respiratory parameters of chest and abdominal movements are recorded with Peizo-[name] motion transducers.  Oxygen saturation was recorded by pulse oximetry.    Sleep Architecture The total recording time of the diagnostic portion of the study was 253.0 minutes.  The total sleep time was 123.0 minutes.  During the diagnostic portion of the study, the patient spent 6.1% of total sleep time in Stage N1, 91.9% in Stage N2, 2.0% in Stages N3, and 0.0% in REM.   Sleep latency was 67.5 minutes.  REM latency was - minutes.  Sleep Efficiency was 48.6%.  Wake after Sleep Onset time was 62.5 minutes.   At 02:06:08 AM the patient was placed on PAP treatment and was titrated at pressures ranging from 4* cm/H20 with supplemental oxygen at - up to 4* cm/H20 with supplemental oxygen at -.  The total recording time of the treatment portion of the study was 175.0 minutes.  The total sleep time was - minutes.  During the treatment portion of the study, the patient spent  - of total sleep time in Stage N1, - in Stage N2, - in Stages N3, and - in REM.   Sleep latency was - minutes.  REM latency was - minutes.  Sleep Efficiency was 0.0%.  Wake after Sleep Onset time was - minutes.  Respiratory Events During the diagnostic portion of the study, the polysomnogram revealed a presence of 1 obstructive, 1 central, and - mixed apneas resulting in an Apnea index of 1.0 events per hour.  There were 73 hypopneas (>=3% desaturation and/or arousal) resulting in an Apnea\Hypopnea Index (AHI >=3% desaturation and/or arousal) of 36.6 events per hour.  There were 52 hypopneas (>=4% desaturation) resulting in an Apnea\Hypopnea Index (AHI >=4% desaturation) of 26.3 events per hour.  There were 42 Respiratory Effort Related Arousals resulting in a RERA index of 20.5 events per hour. The Respiratory Disturbance Index is 57.1 events per hour.  The snore index was - events per hour.  Mean oxygen saturation was 93.1%.  The lowest oxygen saturation during sleep was 83.0%.  Time spent <=88% oxygen saturation was 6.3 minutes (2.5%).  During the treatment portion of the study, the polysomnogram revealed a presence of - obstructive, - central, and - mixed apneas resulting in an Apnea index of - events per hour.  There were - hypopneas (>=3% desaturation and/or arousal) resulting in an Apnea\Hypopnea Index (AHI >=3% desaturation and/or arousal) of - events per hour.  There were - hypopneas (>=4% desaturation) resulting in an Apnea\Hypopnea Index (AHI >=4% desaturation) of - events per hour.  There were - Respiratory Effort Related Arousals resulting in  a RERA index of - events per hour. The Respiratory Disturbance Index is - events per hour.  The snore index was - events per hour.  Mean oxygen saturation was 93.7%.  The lowest oxygen saturation during sleep was -.  Time spent <=88% oxygen saturation was - minutes (-).  Limb Activity During the diagnostic portion of the study, there were 47 limb movements  recorded.  Of this total, 47 were classified as PLMs.  Of the PLMs, - were associated with arousals.  The Limb Movement index was 22.9 per hour while the PLM index was 22.9 per hour.  During the treatment portion of the study, there were - limb movements recorded.  Of this total, - were classified as PLMs.  Of the PLMs, - were associated with arousals.  The Limb Movement index was - per hour while the PLM index was - per hour.  Cardiac Summary During the diagnostic portion of the study, the average pulse rate was 72.1 bpm.  The minimum pulse rate was 64.0 bpm while the maximum pulse rate was 81.0 bpm.  During the treatment portion of the study, the average pulse rate was 69.8 bpm.  The minimum pulse rate was 63.0 bpm while the maximum pulse rate was 78.0 bpm.   Comments: The patient met split night criteria and was placed on CPAP at a pressure of 4 cm H2O. The pressure was not adjusted because the patient did not fall back asleep. REM sleep was not noted  Diagnosis: Moderate OSA, predominant hypopneas  Recommendations: Options of treatment include oral appliance or CPAP therapy Auto titrating CPAP with pressure settings of 5-15 may be appropriate. Consider desensitization given inability to sleep with CPAP in the lab Close clinical follow up with compliance monitoring to optimize therapeutic efficiency Weight loss measures encouraged She should be cautioned against driving when sleepy & against medications with sedative side effects.    This study was personally reviewed and electronically signed by: JUDE HARDEN GAILS., MD Accredited Board Certified in Sleep Medicine

## 2024-02-11 NOTE — Telephone Encounter (Signed)
 Last ov 01/01/24 Plan - You can return your portable oxygen and you do not need it for any distance up to 600 feet - Get chest x-ray today  Are you ok with order being placed to d/c 02?

## 2024-02-12 ENCOUNTER — Ambulatory Visit: Payer: Self-pay | Admitting: Adult Health

## 2024-02-12 ENCOUNTER — Encounter: Payer: Self-pay | Admitting: Adult Health

## 2024-02-12 ENCOUNTER — Telehealth: Admitting: Adult Health

## 2024-02-12 DIAGNOSIS — Z8701 Personal history of pneumonia (recurrent): Secondary | ICD-10-CM | POA: Diagnosis not present

## 2024-02-12 DIAGNOSIS — Z87891 Personal history of nicotine dependence: Secondary | ICD-10-CM

## 2024-02-12 DIAGNOSIS — J309 Allergic rhinitis, unspecified: Secondary | ICD-10-CM

## 2024-02-12 DIAGNOSIS — J310 Chronic rhinitis: Secondary | ICD-10-CM

## 2024-02-12 DIAGNOSIS — Z8619 Personal history of other infectious and parasitic diseases: Secondary | ICD-10-CM | POA: Diagnosis not present

## 2024-02-12 DIAGNOSIS — G4733 Obstructive sleep apnea (adult) (pediatric): Secondary | ICD-10-CM

## 2024-02-12 DIAGNOSIS — J4489 Other specified chronic obstructive pulmonary disease: Secondary | ICD-10-CM

## 2024-02-12 DIAGNOSIS — R0602 Shortness of breath: Secondary | ICD-10-CM

## 2024-02-12 DIAGNOSIS — Z8709 Personal history of other diseases of the respiratory system: Secondary | ICD-10-CM

## 2024-02-12 DIAGNOSIS — F17200 Nicotine dependence, unspecified, uncomplicated: Secondary | ICD-10-CM

## 2024-02-12 DIAGNOSIS — J4540 Moderate persistent asthma, uncomplicated: Secondary | ICD-10-CM

## 2024-02-12 MED ORDER — FLUTICASONE PROPIONATE 50 MCG/ACT NA SUSP: 2.0000 | Freq: Every day | NASAL | 5 refills | Status: DC | PRN

## 2024-02-12 NOTE — Progress Notes (Signed)
 Virtual Visit via Video Note  I connected with Emily [name] on 02/12/24 at  3:30 PM EDT by a video enabled telemedicine application and verified that I am speaking with the correct person using two identifiers.  Location: Patient: Home  Provider: Office    I discussed the limitations of evaluation and management by telemedicine and the availability of in person appointments. The patient expressed understanding and agreed to proceed.  History of Present Illness: 43 year old female former smoker (heavy smoking history) followed for chronic asthmatic bronchitis Medical history significant for diabetes, bipolar disorder and panic disorder Prolonged hospitalization in August 2025 with severe sepsis secondary to parinfluenza and pneumonia  Today's video visit is to discuss recent sleep study results.  Patient was hospitalized in August with severe sepsis from parainfluenza virus and pneumonia.  She says she is doing much better.  Follow-up chest x-ray last month showed interval improvement in bilateral airspace opacities.  She has a CT chest pending next month.  Patient has quit smoking.  Congratulated on cessation.  She remains on Advair twice daily and DuoNeb 3 times daily.  Was tried on Trelegy but was unable to tolerate.  Patient was set up for a sleep study that was completed on February 08, 2024 that showed moderate sleep apnea with AHI at 26.3/hour and SpO2 low at 83%.  We discussed her sleep study results in detail.  Patient has significant symptom burden with restless sleep and daytime sleepiness would like to proceed with CPAP therapy   Observations/Objective: ,02/12/2024 appears well no acute distress  Labs: 2020 :  Ace - 81 Esr - 30 Rh<14 CCP<16 ANCA - negative  Hypersensitive activity pneumonitis panel negative Anti-Jo 1 less than 0.2 RNP antibodies greater than 8 Sjogren's syndrome antibodies negative Anti-scleroderma antibody less than 1 Aldolase 4.5 MPO/PR-3 (ANCA)  antibodies-less than 1 Total CK 47, CK-MB less than 7 -negative     04/01/2018-CT chest high resolution- few patchy areas of mild groundglass attenuation in the lungs bilaterally nonspecific, stable pulmonary nodules from 2009    feNO - 01/26/2018 - 5 ppbd and is normal    01/08/2015- spirometry- FVC 1.33 (38% predicted), ratio 72, FEV1 1.01 (37% predicted)   High-resolution CT chest July 25, 2020 bandlike scarring left base, no evidence of interstitial lung disease, mild air trapping, fine centrilobular nodularity concentrated in the lung apices consistent with smoking-related bronchiolitis. Neg for ILD .   Assessment and Plan: Moderate obstructive sleep apnea with significant symptom burden.  Patient will begin on auto CPAP 5 to 15 cm H2O.  Patient education given on sleep apnea and CPAP  - discussed how weight can impact sleep and risk for sleep disordered breathing - discussed options to assist with weight loss: combination of diet modification, cardiovascular and strength training exercises   - had an extensive discussion regarding the adverse health consequences related to untreated sleep disordered breathing - specifically discussed the risks for hypertension, coronary artery disease, cardiac dysrhythmias, cerebrovascular disease, and diabetes - lifestyle modification discussed   - discussed how sleep disruption can increase risk of accidents, particularly when driving - safe driving practices were discussed   Chronic asthmatic bronchitis appears currently stable.  Unable to tolerate Trelegy.  Continue on Advair twice daily and DuoNeb nebulizer 3 times daily.  Congratulated on smoking cessation  Chronic allergic rhinitis continue on Singulair  and Flonase  daily appears controlled  Multifocal pneumonia with severe sepsis-hospitalization August 2025.  Follow-up chest x-ray showed improvement.  Clinically is improved.  Follow-up CT chest  pending for next month  Plan  Patient  Instructions  Continue on Advair 1 puff Twice daily   Continue on Duoneb Three times a day   Albuterol  inhaler As needed    Continue on Singulair  daily  Continue on Flonase  nasal daily   Begin CPAP At bedtime, wear all night long  Do not drive if sleepy   CT chest planned next month .   Get your flu shot soon.   Follow up with in 3 months and .As needed       Follow Up Instructions:    I discussed the assessment and treatment plan with the patient. The patient was provided an opportunity to ask questions and all were answered. The patient agreed with the plan and demonstrated an understanding of the instructions.   The patient was advised to call back or seek an in-person evaluation if the symptoms worsen or if the condition fails to improve as anticipated.  I provided 30 minutes of non-face-to-face time during this encounter.   Madelin Stank, NP

## 2024-02-12 NOTE — Patient Instructions (Addendum)
 Continue on Advair 1 puff Twice daily   Continue on Duoneb Three times a day   Albuterol  inhaler As needed    Continue on Singulair  daily  Continue on Flonase  nasal daily   Begin CPAP At bedtime, wear all night long  Do not drive if sleepy   CT chest planned next month .   Get your flu shot soon.   Follow up with in 3 months and .As needed

## 2024-02-15 ENCOUNTER — Telehealth: Payer: Self-pay

## 2024-02-15 DIAGNOSIS — Z8709 Personal history of other diseases of the respiratory system: Secondary | ICD-10-CM

## 2024-02-15 DIAGNOSIS — R0602 Shortness of breath: Secondary | ICD-10-CM

## 2024-02-15 NOTE — Telephone Encounter (Signed)
 Order placed

## 2024-02-15 NOTE — Telephone Encounter (Signed)
Ok to dc o2 

## 2024-02-15 NOTE — Telephone Encounter (Signed)
 Copied from CRM #[id number]. Topic: Clinical - Medical Advice >> Feb 11, 2024  1:37 PM Corean SAUNDERS wrote: Reason for CRM: Patient is inquiring on on if Dr. Geronimo still needs to her to have labs done and is also wanting to ask a questions about her most recent CT results.   Patient had a video visit with Madelin Stank, NP on 02/12/2024.  Left message on patient voice mail for patient to return call if she had any further questions or concerns.  Will close this encounter.

## 2024-02-15 NOTE — Telephone Encounter (Signed)
 Duplicate. NFN

## 2024-02-15 NOTE — Telephone Encounter (Signed)
 Dr Geronimo, please clarify if it is okay to d/c O2.

## 2024-02-16 ENCOUNTER — Telehealth: Payer: Self-pay

## 2024-02-18 ENCOUNTER — Ambulatory Visit: Admitting: Internal Medicine

## 2024-02-18 ENCOUNTER — Ambulatory Visit: Admitting: "Endocrinology

## 2024-02-22 ENCOUNTER — Encounter: Payer: Self-pay | Admitting: Radiology

## 2024-02-22 ENCOUNTER — Other Ambulatory Visit: Payer: Self-pay | Admitting: Adult Health

## 2024-02-22 MED ORDER — TERCONAZOLE 0.4 % VA CREA: 1.0000 | TOPICAL_CREAM | Freq: Every day | VAGINAL | 0 refills | Status: DC

## 2024-02-22 NOTE — Progress Notes (Signed)
 Rx terazol 7 cream

## 2024-02-24 ENCOUNTER — Other Ambulatory Visit: Payer: Self-pay

## 2024-02-24 ENCOUNTER — Telehealth: Payer: Self-pay | Admitting: *Deleted

## 2024-02-24 DIAGNOSIS — J42 Unspecified chronic bronchitis: Secondary | ICD-10-CM

## 2024-02-24 DIAGNOSIS — J45901 Unspecified asthma with (acute) exacerbation: Secondary | ICD-10-CM

## 2024-02-24 DIAGNOSIS — J9601 Acute respiratory failure with hypoxia: Secondary | ICD-10-CM

## 2024-02-24 DIAGNOSIS — F319 Bipolar disorder, unspecified: Secondary | ICD-10-CM

## 2024-02-24 NOTE — Patient Instructions (Signed)
 SABRA

## 2024-02-24 NOTE — Telephone Encounter (Signed)
 Copied from CRM [id number]. Topic: Referral - Status >> Feb 24, 2024  9:23 AM Nathanel DEL wrote: Reason for CRM: Consuelo w/ [facility] calling to ask if authorization has been done for the pt's appt this Fri., Nov. 7  [phone] Bonnie Needs by tomorrow 11/06 at 10am.

## 2024-02-25 ENCOUNTER — Telehealth

## 2024-02-25 ENCOUNTER — Ambulatory Visit: Admitting: Surgery

## 2024-02-25 ENCOUNTER — Telehealth: Payer: Self-pay

## 2024-02-25 NOTE — Telephone Encounter (Signed)
 Copied from CRM [id number]. Topic: Clinical - Lab/Test Results >> Feb 25, 2024  1:20 PM Russell PARAS wrote: Reason for CRM:   Pt contacting clinic regarding the results of her ONO. Requested call back once results have been received and reviewed, so that can be reviewed w/pt  CB#  [phone]  PCC's, are you able to locate the ONO results?

## 2024-02-25 NOTE — Patient Outreach (Signed)
 Complex Care Management   Visit Note  02/25/2024  Name:  Emily [name] MRN: [id number] DOB: 1981-01-27  Situation: Referral received for Complex Care Management related to Pulmonary Disease and dx of Chronic Bronchitis, Acute Asthma and recent hx of hospitalization for Acute Respiratory Failure w/ hypoxia   Goal: I obtained verbal consent from Patient.  Visit completed with Patient  on the phone  Background:   Past Medical History:  Diagnosis Date   Abnormal CT of liver    Anxiety    Asthma    Asthma    Bipolar 1 disorder (HCC)    Bipolar disorder (HCC)    Chronic pain    Cirrhosis (HCC)    Depression    Diabetes mellitus without complication (HCC)    Fatty liver    Gallstones    GERD (gastroesophageal reflux disease)    Headache(784.0)    Hyperlipidemia    Hypertension    Hypothyroidism    Lupus (systemic lupus erythematosus) (HCC)    Pulmonary nodule    Tachycardia    Thyroid disease    Vitamin D  deficiency     Assessment: Patient Reported Symptoms:  Cognitive Cognitive Status: No symptoms reported, Alert and oriented to person, place, and time, Normal speech and language skills, Insightful and able to interpret abstract concepts Cognitive/Intellectual Conditions Management [RPT]: None reported or documented in medical history or problem list   Health Maintenance Behaviors: Annual physical exam, Stress management Healing Pattern: Unsure Health Facilitated by: Stress management, Rest  Neurological Neurological Review of Symptoms: No symptoms reported    HEENT HEENT Symptoms Reported: No symptoms reported      Cardiovascular Cardiovascular Symptoms Reported: No symptoms reported Cardiovascular Management Strategies: Adequate rest, Coping strategies, Medication therapy, Routine screening Cardiovascular Self-Management Outcome: 4 (good)  Respiratory Respiratory Symptoms Reported: Other: Other Respiratory Symptoms: Has recently weaned off home O2 s/p  hospitalization for respiratory failure w/ hypoxia Respiratory Management Strategies: Activity, Adequate rest, Asthma action plan, Breathing exercise, Breathing techniques, Coping strategies, Medication therapy, Routine screening Respiratory Self-Management Outcome: 4 (good)  Endocrine Endocrine Symptoms Reported: No symptoms reported Is patient diabetic?: Yes Endocrine Self-Management Outcome: 4 (good)  Gastrointestinal Gastrointestinal Symptoms Reported: No symptoms reported      Genitourinary Genitourinary Symptoms Reported: No symptoms reported    Integumentary Integumentary Symptoms Reported: No symptoms reported    Musculoskeletal Musculoskelatal Symptoms Reviewed: No symptoms reported        Psychosocial Psychosocial Symptoms Reported: Depression - if selected complete PHQ 2-9, Anxiety - if selected complete GAD   Major Change/Loss/Stressor/Fears (CP): Medical condition, self Do you feel physically threatened by others?: No    02/25/2024    PHQ2-9 Depression Screening   Little interest or pleasure in doing things Not at all  Feeling down, depressed, or hopeless Not at all  PHQ-2 - Total Score 0  Trouble falling or staying asleep, or sleeping too much    Feeling tired or having little energy    Poor appetite or overeating     Feeling bad about yourself - or that you are a failure or have let yourself or your family down    Trouble concentrating on things, such as reading the newspaper or watching television    Moving or speaking so slowly that other people could have noticed.  Or the opposite - being so fidgety or restless that you have been moving around a lot more than usual    Thoughts that you would be better off dead, or hurting yourself in  some way    PHQ2-9 Total Score    If you checked off any problems, how difficult have these problems made it for you to do your work, take care of things at home, or get along with other people    Depression Interventions/Treatment       There were no vitals filed for this visit.  Medications Reviewed Today     Reviewed by Gordy Channing LABOR, RN (Registered Nurse) on 02/24/24 at 1545  Med List Status: <None>   Medication Order Taking? Sig Documenting Provider Last Dose Status Informant  ACCU-CHEK AVIVA PLUS test strip [id number] Yes 1 each by Other route 3 (three) times daily. [provider]  Active   JANIS GONG LANCETS MISC [id number] Yes USE AS DIRECTED Tysinger, David S, PA-C  Active   ADVAIR DISKUS 250-50 MCG/ACT AEPB [id number] Yes SMARTSIG:1 Puff(s) Via Inhaler Every 12 Hours [provider]  Active   albuterol  (VENTOLIN  HFA) 108 (90 Base) MCG/ACT inhaler [id number] Yes Inhale 2 puffs into the lungs every 6 (six) hours as needed for wheezing or shortness of breath. Ruthell Lauraine FALCON, NP  Active Self  amLODipine  (NORVASC ) 10 MG tablet [id number] Yes Take 1 tablet (10 mg total) by mouth daily. Jillian Buttery, MD  Active   benzonatate  (TESSALON ) 100 MG capsule [id number] Yes Take 1 capsule (100 mg total) by mouth 3 (three) times daily as needed for cough. Gladis Elsie BROCKS, PA-C  Active Self  Blood Glucose Monitoring Suppl (ACCU-CHEK AVIVA PLUS) w/Device PRESSLEY [id number] Yes USE AS DIRECTED Bulah Alm RAMAN, PA-C  Active Self  Blood Glucose Monitoring Suppl DEVI [id number] Yes 1 each by Does not apply route in the morning, at noon, and at bedtime. May substitute to any manufacturer covered by patient's insurance. Dartha Ernst, MD  Active Self  clonazePAM  (KLONOPIN ) 1 MG tablet [id number] Yes Take 1 mg by mouth 2 (two) times daily as needed for anxiety. [provider]  Active Self  cyanocobalamin  1000 MCG tablet [id number] Yes Take 1 tablet (1,000 mcg total) by mouth daily. Jillian Buttery, MD  Active   doxycycline  (VIBRA -TABS) 100 MG tablet [id number] Yes Take 1 tablet (100 mg total) by mouth 2 (two) times daily. Signa Nest A, NP  Active   fluticasone  (FLONASE ) 50 MCG/ACT nasal spray  [id number] Yes Place 2 sprays into both nostrils daily as needed for allergies or rhinitis. Parrett, Madelin RAMAN, NP  Active   folic acid  (FOLVITE ) 1 MG tablet [id number] Yes Take 1 tablet (1 mg total) by mouth daily. Jillian Buttery, MD  Active   furosemide  (LASIX ) 20 MG tablet [id number] Yes Take 20 mg by mouth. [provider]  Active   furosemide  (LASIX ) 20 MG tablet [id number] Yes Take 20 mg by mouth daily. [provider]  Active   insulin  glargine (LANTUS  SOLOSTAR) 100 UNIT/ML Solostar Pen [id number] Yes Inject 40 Units into the skin daily. Jillian Buttery, MD  Active   insulin  lispro (HUMALOG  KWIKPEN) 100 UNIT/ML KwikPen [id number] Yes Inject 12 Units into the skin 3 (three) times daily. Jillian Buttery, MD  Active   Insulin  Pen Needle 32G X 4 MM MISC [id number] Yes 1 Device by Does not apply route in the morning, at noon, in the evening, and at bedtime. Jillian Buttery, MD  Active   ipratropium (ATROVENT ) 0.03 % nasal spray [id number] Yes Place 2 sprays into both nostrils every 12 (twelve) hours. Gladis Elsie BROCKS, PA-C  Active Self  ipratropium-albuterol  (DUONEB) 0.5-2.5 (3) MG/3ML SOLN [id number] Yes Take 3  mLs by nebulization in the morning, at noon, and at bedtime. Geronimo Amel, MD  Active Self  lamoTRIgine  (LAMICTAL ) 150 MG tablet [id number] Yes Take 150 mg by mouth at bedtime. [provider]  Active Self  levothyroxine  (SYNTHROID ) 75 MCG tablet [id number] Yes TAKE 1 TABLET BY MOUTH EVERY DAY BEFORE BREAKFAST Williamson, Joanna R, NP  Active   montelukast  (SINGULAIR ) 10 MG tablet [id number] Yes TAKE 1 TABLET BY MOUTH EVERYDAY AT BEDTIME Parrett, Madelin RAMAN, NP  Active Self  Nebulizers (COMPRESSOR/NEBULIZER) MISC [id number] Yes Use as directed Geronimo Amel, MD  Active Self  nicotine  (NICODERM CQ  - DOSED IN MG/24 HOURS) 21 mg/24hr patch [id number] Yes Place 1 patch (21 mg total) onto the skin daily. Jillian Buttery, MD  Active   nitroGLYCERIN  (NITROSTAT ) 0.4 MG SL tablet  [id number] Yes Place 1 tablet (0.4 mg total) under the tongue every 5 (five) minutes as needed for chest pain. Nahser, Aleene PARAS, MD  Active Self  PARoxetine  (PAXIL ) 10 MG tablet [id number] Yes Take 10 mg by mouth in the morning. [provider]  Active Self  promethazine  (PHENERGAN ) 12.5 MG tablet [id number] Yes Take 1 tablet (12.5 mg total) by mouth every 6 (six) hours as needed. Signa Delon LABOR, NP  Active   promethazine -dextromethorphan  (PROMETHAZINE -DM) 6.25-15 MG/5ML syrup [id number] Yes Take 5 mLs by mouth 4 (four) times daily as needed. Vivienne Delon HERO, PA-C  Active Self  propranolol  (INDERAL ) 20 MG tablet [id number] Yes Take 1 tablet (20 mg total) by mouth 3 (three) times daily. Lavona Agent, MD  Active Self  Respiratory Therapy Supplies (NEBULIZER/TUBING/MOUTHPIECE) KIT [id number] Yes Use as directed Geronimo Amel, MD  Active   rosuvastatin  (CRESTOR ) 5 MG tablet [id number] Yes Take 1 tablet (5 mg total) by mouth daily. Motwani, Komal, MD  Active Self  solifenacin  (VESICARE ) 5 MG tablet [id number] Yes Take 1 tablet (5 mg total) by mouth daily. Signa Delon LABOR, NP  Active Self  terconazole (TERAZOL 7) 0.4 % vaginal cream [id number] Yes Place 1 applicator vaginally at bedtime. Signa Delon A, NP  Active   TYLENOL  500 MG tablet [id number] Yes Take 500-1,000 mg by mouth every 6 (six) hours as needed for mild pain (pain score 1-3) or headache. [provider]  Active Self  Vitamin D , Ergocalciferol , (DRISDOL ) 1.25 MG (50000 UNIT) CAPS capsule [id number] Yes Take 50,000 Units by mouth every Wednesday. [provider]  Active Self  ziprasidone  (GEODON ) 60 MG capsule [id number] Yes Take 60 mg by mouth at bedtime. [provider]  Active Self            Recommendation:   Referral to LCSW for counseling services via telephone  Follow Up Plan:   Telephone follow up appointment date/time:  03/09/24 at 4pm with RN Care Manager  Darrious Youman A. Gordy  RN, BA, [facility], CRRN [place]  [facility] RN Care Manager Direct Dial : [phone]  Fax: [phone]

## 2024-02-25 NOTE — Telephone Encounter (Signed)
 I have sent an urgent message to Avelina at [employer] in regards to ONO results and getting them to our office

## 2024-02-26 ENCOUNTER — Other Ambulatory Visit

## 2024-02-28 DIAGNOSIS — B348 Other viral infections of unspecified site: Secondary | ICD-10-CM | POA: Diagnosis not present

## 2024-02-29 ENCOUNTER — Telehealth: Payer: Self-pay | Admitting: *Deleted

## 2024-02-29 ENCOUNTER — Ambulatory Visit
Admission: RE | Admit: 2024-02-29 | Discharge: 2024-02-29 | Disposition: A | Discharge status: Home / Self Care | Source: Ambulatory Visit | Attending: Adult Health | Admitting: Adult Health

## 2024-02-29 DIAGNOSIS — R911 Solitary pulmonary nodule: Secondary | ICD-10-CM | POA: Diagnosis not present

## 2024-02-29 DIAGNOSIS — R918 Other nonspecific abnormal finding of lung field: Secondary | ICD-10-CM

## 2024-02-29 NOTE — Progress Notes (Signed)
 Complex Care Management Note  Care Guide Note 02/29/2024 Name: LAWANA HARTZELL MRN: [id number] DOB: May 25, 1980  [name] is a 43 y.o. year old female who sees [name], NP for primary care. I reached out to [name] by phone today to offer complex care management services.  Ms. Deshotels was given information about Complex Care Management services today including:   The Complex Care Management services include support from the care team which includes your Nurse Care Manager, Clinical Social Worker, or Pharmacist.  The Complex Care Management team is here to help remove barriers to the health concerns and goals most important to you. Complex Care Management services are voluntary, and the patient may decline or stop services at any time by request to their care team member.   Complex Care Management Consent Status: Patient agreed to services and verbal consent obtained.   Follow up plan:  Telephone appointment with complex care management team member scheduled for:  03/21/2024  Encounter Outcome:  Patient Scheduled  Thedford Franks, CMA [place]  [facility], [facility] Guide Direct Dial : [phone]  Fax: [phone] Website: [email or web address]

## 2024-03-01 ENCOUNTER — Telehealth: Payer: Self-pay | Admitting: Internal Medicine

## 2024-03-01 NOTE — Telephone Encounter (Signed)
 Routing to [name] who recently saw patient, looks like she just had a split night sleep study

## 2024-03-01 NOTE — Telephone Encounter (Signed)
 Routing to DOD since MR out of clinic until 03/07/24

## 2024-03-01 NOTE — Telephone Encounter (Signed)
 Received fax from [employer]  ONO on RA done 02/24/24  Time spent <88% 53 min and 24 sec  Time spent <89% 1 hour 19 min 32 sec   Routing to MR As urgent since pt does qualify for o2 with sleep  Copy of the results placed in MR's red lookat folder in A pod

## 2024-03-01 NOTE — Telephone Encounter (Signed)
 She just had Sleep study -started on CPAP was this done on CPAP ?

## 2024-03-02 ENCOUNTER — Other Ambulatory Visit

## 2024-03-02 DIAGNOSIS — F410 Panic disorder [episodic paroxysmal anxiety] without agoraphobia: Secondary | ICD-10-CM | POA: Diagnosis not present

## 2024-03-02 DIAGNOSIS — F4312 Post-traumatic stress disorder, chronic: Secondary | ICD-10-CM | POA: Diagnosis not present

## 2024-03-02 DIAGNOSIS — F3170 Bipolar disorder, currently in remission, most recent episode unspecified: Secondary | ICD-10-CM | POA: Diagnosis not present

## 2024-03-02 DIAGNOSIS — Z5181 Encounter for therapeutic drug level monitoring: Secondary | ICD-10-CM | POA: Diagnosis not present

## 2024-03-02 DIAGNOSIS — F411 Generalized anxiety disorder: Secondary | ICD-10-CM | POA: Diagnosis not present

## 2024-03-02 NOTE — Telephone Encounter (Signed)
 I spoke with the pt  She states has not received her CPAP yet  She has appt with DME next wk to get her machine  So, this ONO was done just on RA   Routing back to TP

## 2024-03-03 ENCOUNTER — Ambulatory Visit: Payer: Self-pay

## 2024-03-03 ENCOUNTER — Telehealth: Payer: Self-pay | Admitting: Pharmacy Technician

## 2024-03-03 NOTE — Telephone Encounter (Signed)
 [name]?: FYI only for provider: appointment scheduled on 03/04/24.  Patient was last seen in primary care on 12/03/2023 by Gladis Elsie BROCKS, PA-C.  Called Nurse Triage reporting Headache and Flashes/floaters.  Symptoms began today.  Interventions attempted: Nothing.  Symptoms are: unchanged.  Triage Disposition: See HCP Within 4 Hours (Or PCP Triage)  Patient/caregiver understands and will follow disposition?: Yes        Copied from CRM [id number]. Topic: Clinical - Red Word Triage >> Mar 03, 2024  4:51 PM Emily [name] wrote: Red Word that prompted transfer to Nurse Triage: Patient states she is having really bad painful headaches, very irritable,and has something that's like green floaters on whatever she looks at, Reason for Disposition  Many floaters in the eye  (Exception: Floater(s) are a chronic symptom and this is unchanged from patient's baseline pattern.)    Pt reports seeing green spots on walls that self resolved. This has happened x 2 events, once being in the hospital. Pt did endorse having a bad headache that self resolved after taking multiple ibuprofen last week. Denies any other sx aside from irritability.  Scheduled with alternate provider.  Answer Assessment - Initial Assessment Questions 1. DESCRIPTION: How has your vision changed? (e.g., complete vision loss, blurred vision, double vision, floaters, etc.)     Seeing green spots on the wall -- looks like germ spots on the wall 2. LOCATION: One or both eyes? If one, ask: Which eye?     N/a 3. SEVERITY: Can you see anything? If Yes, ask: What can you see? (e.g., fine print)     Yes, can still read fine print 4. ONSET: When did this begin? Did it start suddenly or has this been gradual?     Last night 5. PATTERN: Does this come and go, or has it been constant since it started?     Comes and goes 6. PAIN: Is there any pain in your eye(s)?  (Scale 1-10; or mild, moderate,  severe)     denies 7. CONTACTS-GLASSES: Do you wear contacts or glasses?     denies 8. CAUSE: What do you think is causing this visual problem?     unknown 9. OTHER SYMPTOMS: Do you have any other symptoms? (e.g., confusion, headache, arm or leg weakness, speech problems)     Irritability, H/a a few weeks ago that resolved 10. PREGNANCY: Is there any chance you are pregnant? When was your last menstrual period?       N/a  Protocols used: Vision Loss or Change-A-AH

## 2024-03-03 NOTE — Telephone Encounter (Signed)
Will discuss at ov 

## 2024-03-03 NOTE — Telephone Encounter (Signed)
 Pharmacy Patient Advocate Encounter   Received notification from CoverMyMeds that prior authorization for Ozempic  (0.25 or 0.5 MG/DOSE) 2MG /3ML pen-injectors is due for renewal.   Insurance verification completed.   The patient is insured through [employer].  Action: Medication has been discontinued. Archived Key: [id number]

## 2024-03-04 ENCOUNTER — Ambulatory Visit: Payer: Self-pay | Admitting: Adult Health

## 2024-03-04 ENCOUNTER — Ambulatory Visit: Admitting: Family Medicine

## 2024-03-08 ENCOUNTER — Ambulatory Visit: Admitting: "Endocrinology

## 2024-03-08 DIAGNOSIS — M7918 Myalgia, other site: Secondary | ICD-10-CM | POA: Diagnosis not present

## 2024-03-08 DIAGNOSIS — G894 Chronic pain syndrome: Secondary | ICD-10-CM | POA: Diagnosis not present

## 2024-03-08 NOTE — Progress Notes (Unsigned)
 Patient ID: Emily [name]                 DOB: February 22, 1981                    MRN: [id number]      HPI: Emily [name] is a 43 y.o. female patient referred to lipid clinic by  Dr. Lavona. PMH is significant for alcoholic cirrhosis of liver, anxiety, bipolar disorder, asthma, GERD, T2DM, coronary calcification found on CT and hypothyroidism.  Reviewed options for lowering LDL cholesterol, including ezetimibe, PCSK-9 inhibitors, bempedoic acid and inclisiran.  Discussed mechanisms of action, dosing, side effects and potential decreases in LDL cholesterol.  Also reviewed cost information and potential options for patient assistance.   Current Medications:  Intolerances: rosuvastatin  5mg  (leg pain) Risk Factors: HTN, DM, coronary calcification LDL-C goal: < 70    Diet:   Exercise:   Family History:  Father: Valvular heart diease Social History:  Smoker Labs: Lipid Panel     Component Value Date/Time   CHOL 223 (H) 01/16/2023 1041   CHOL 185 07/16/2021 1412   TRIG (H) 01/16/2023 1041    411.0 Triglyceride is over 400; calculations on Lipids are invalid.   HDL 34.80 (L) 01/16/2023 1041   HDL 36 (L) 07/16/2021 1412   CHOLHDL 6 01/16/2023 1041   VLDL 62.4 (H) 09/24/2022 1451   LDLCALC 102 (H) 07/16/2021 1412   LDLDIRECT 128.0 01/16/2023 1041   LABVLDL 47 (H) 07/16/2021 1412    Past Medical History:  Diagnosis Date   Abnormal CT of liver    Anxiety    Asthma    Asthma    Bipolar 1 disorder (HCC)    Bipolar disorder (HCC)    Chronic pain    Cirrhosis (HCC)    Depression    Diabetes mellitus without complication (HCC)    Fatty liver    Gallstones    GERD (gastroesophageal reflux disease)    Headache(784.0)    Hyperlipidemia    Hypertension    Hypothyroidism    Lupus (systemic lupus erythematosus) (HCC)    Pulmonary nodule    Tachycardia    Thyroid disease    Vitamin D  deficiency     Current Outpatient Medications on File Prior to Visit  Medication Sig  Dispense Refill   ACCU-CHEK AVIVA PLUS test strip 1 each by Other route 3 (three) times daily.     ACCU-CHEK FASTCLIX LANCETS MISC USE AS DIRECTED 102 each 3   ADVAIR DISKUS 250-50 MCG/ACT AEPB SMARTSIG:1 Puff(s) Via Inhaler Every 12 Hours     albuterol  (VENTOLIN  HFA) 108 (90 Base) MCG/ACT inhaler Inhale 2 puffs into the lungs every 6 (six) hours as needed for wheezing or shortness of breath. 18 g 3   amLODipine  (NORVASC ) 10 MG tablet Take 1 tablet (10 mg total) by mouth daily. 60 tablet 0   benzonatate  (TESSALON ) 100 MG capsule Take 1 capsule (100 mg total) by mouth 3 (three) times daily as needed for cough. 30 capsule 0   Blood Glucose Monitoring Suppl (ACCU-CHEK AVIVA PLUS) w/Device KIT USE AS DIRECTED 1 kit 0   Blood Glucose Monitoring Suppl DEVI 1 each by Does not apply route in the morning, at noon, and at bedtime. May substitute to any manufacturer covered by patient's insurance. 1 each 0   clonazePAM  (KLONOPIN ) 1 MG tablet Take 1 mg by mouth 2 (two) times daily as needed for anxiety.     cyanocobalamin  1000 MCG tablet Take 1  tablet (1,000 mcg total) by mouth daily. 60 tablet 0   doxycycline  (VIBRA -TABS) 100 MG tablet Take 1 tablet (100 mg total) by mouth 2 (two) times daily. 14 tablet 0   fluticasone  (FLONASE ) 50 MCG/ACT nasal spray Place 2 sprays into both nostrils daily as needed for allergies or rhinitis. 1 mL 5   folic acid  (FOLVITE ) 1 MG tablet Take 1 tablet (1 mg total) by mouth daily. 30 tablet 0   furosemide  (LASIX ) 20 MG tablet Take 20 mg by mouth.     furosemide  (LASIX ) 20 MG tablet Take 20 mg by mouth daily.     insulin  glargine (LANTUS  SOLOSTAR) 100 UNIT/ML Solostar Pen Inject 40 Units into the skin daily. 25 mL 0   insulin  lispro (HUMALOG  KWIKPEN) 100 UNIT/ML KwikPen Inject 12 Units into the skin 3 (three) times daily. 25 mL 0   Insulin  Pen Needle 32G X 4 MM MISC 1 Device by Does not apply route in the morning, at noon, in the evening, and at bedtime. 100 each 0   ipratropium  (ATROVENT ) 0.03 % nasal spray Place 2 sprays into both nostrils every 12 (twelve) hours. 30 mL 0   ipratropium-albuterol  (DUONEB) 0.5-2.5 (3) MG/3ML SOLN Take 3 mLs by nebulization in the morning, at noon, and at bedtime. 270 mL 5   lamoTRIgine  (LAMICTAL ) 150 MG tablet Take 150 mg by mouth at bedtime.     levothyroxine  (SYNTHROID ) 75 MCG tablet TAKE 1 TABLET BY MOUTH EVERY DAY BEFORE BREAKFAST 90 tablet 1   montelukast  (SINGULAIR ) 10 MG tablet TAKE 1 TABLET BY MOUTH EVERYDAY AT BEDTIME 90 tablet 3   Nebulizers (COMPRESSOR/NEBULIZER) MISC Use as directed 1 each 0   nicotine  (NICODERM CQ  - DOSED IN MG/24 HOURS) 21 mg/24hr patch Place 1 patch (21 mg total) onto the skin daily. 28 patch 0   nitroGLYCERIN  (NITROSTAT ) 0.4 MG SL tablet Place 1 tablet (0.4 mg total) under the tongue every 5 (five) minutes as needed for chest pain. 25 tablet 3   PARoxetine  (PAXIL ) 10 MG tablet Take 10 mg by mouth in the morning.     promethazine  (PHENERGAN ) 12.5 MG tablet Take 1 tablet (12.5 mg total) by mouth every 6 (six) hours as needed. 30 tablet 1   promethazine -dextromethorphan  (PROMETHAZINE -DM) 6.25-15 MG/5ML syrup Take 5 mLs by mouth 4 (four) times daily as needed. 118 mL 0   propranolol  (INDERAL ) 20 MG tablet Take 1 tablet (20 mg total) by mouth 3 (three) times daily. 270 tablet 3   Respiratory Therapy Supplies (NEBULIZER/TUBING/MOUTHPIECE) KIT Use as directed 10 kit 11   rosuvastatin  (CRESTOR ) 5 MG tablet Take 1 tablet (5 mg total) by mouth daily. 90 tablet 1   solifenacin  (VESICARE ) 5 MG tablet Take 1 tablet (5 mg total) by mouth daily. 30 tablet 2   terconazole (TERAZOL 7) 0.4 % vaginal cream Place 1 applicator vaginally at bedtime. 45 g 0   TYLENOL  500 MG tablet Take 500-1,000 mg by mouth every 6 (six) hours as needed for mild pain (pain score 1-3) or headache.     Vitamin D , Ergocalciferol , (DRISDOL ) 1.25 MG (50000 UNIT) CAPS capsule Take 50,000 Units by mouth every Wednesday.     ziprasidone  (GEODON ) 60 MG  capsule Take 60 mg by mouth at bedtime.     No current facility-administered medications on file prior to visit.    Allergies  Allergen Reactions   Bactrim  [Sulfamethoxazole -Trimethoprim ] Anaphylaxis, Shortness Of Breath and Other (See Comments)    Chest pain, trouble breathing  Ciprofloxacin Palpitations   Clindamycin /Lincomycin Other (See Comments)    Chest Pain   Flagyl  [Metronidazole ] Shortness Of Breath   Hydrocodone Nausea Only   Iodinated Contrast Media Anaphylaxis, Swelling and Other (See Comments)    Body swells, too   Ondansetron Shortness Of Breath   Liraglutide  Nausea And Vomiting and Other (See Comments)    Victoza    Doxycycline  Nausea And Vomiting   Metformin  And Related Other (See Comments)    Tore up my stomach   Penicillins Rash   Synjardy  Xr [Empagliflozin-Metformin  Hcl Er] Rash and Other (See Comments)    Diaper rash    Assessment/Plan:  1. Hyperlipidemia -  No problem-specific Assessment & Plan notes found for this encounter.    Thank you, Lum Ricks, PharmD Candidate  [facility], [employer]    Eleanor JONETTA Crews, Pharm.JONETTA SARAN, CPP [facility] A Division of [facility] [address], [place], [place] [address]  Phone: [phone]; Fax: [phone]

## 2024-03-08 NOTE — Progress Notes (Unsigned)
 Patient ID: LOLLIE GUNNER                 DOB: 08-19-1980                    MRN: [id number]      HPI: Emily [name] is a 43 y.o. female patient referred to lipid clinic by  ***. PMH is significant for    Reviewed options for lowering LDL cholesterol, including ezetimibe, PCSK-9 inhibitors, bempedoic acid and inclisiran.  Discussed mechanisms of action, dosing, side effects and potential decreases in LDL cholesterol.  Also reviewed cost information and potential options for patient assistance.  Current Medications:  Intolerances:  Risk Factors:  LDL-C goal:  ApoB goal:   Diet:   Exercise:   Family History:   Social History:   Labs: Lipid Panel     Component Value Date/Time   CHOL 223 (H) 01/16/2023 1041   CHOL 185 07/16/2021 1412   TRIG (H) 01/16/2023 1041    411.0 Triglyceride is over 400; calculations on Lipids are invalid.   HDL 34.80 (L) 01/16/2023 1041   HDL 36 (L) 07/16/2021 1412   CHOLHDL 6 01/16/2023 1041   VLDL 62.4 (H) 09/24/2022 1451   LDLCALC 102 (H) 07/16/2021 1412   LDLDIRECT 128.0 01/16/2023 1041   LABVLDL 47 (H) 07/16/2021 1412    Past Medical History:  Diagnosis Date   Abnormal CT of liver    Anxiety    Asthma    Asthma    Bipolar 1 disorder (HCC)    Bipolar disorder (HCC)    Chronic pain    Cirrhosis (HCC)    Depression    Diabetes mellitus without complication (HCC)    Fatty liver    Gallstones    GERD (gastroesophageal reflux disease)    Headache(784.0)    Hyperlipidemia    Hypertension    Hypothyroidism    Lupus (systemic lupus erythematosus) (HCC)    Pulmonary nodule    Tachycardia    Thyroid disease    Vitamin D  deficiency     Current Outpatient Medications on File Prior to Visit  Medication Sig Dispense Refill   ACCU-CHEK AVIVA PLUS test strip 1 each by Other route 3 (three) times daily.     ACCU-CHEK FASTCLIX LANCETS MISC USE AS DIRECTED 102 each 3   ADVAIR DISKUS 250-50 MCG/ACT AEPB SMARTSIG:1 Puff(s) Via Inhaler  Every 12 Hours     albuterol  (VENTOLIN  HFA) 108 (90 Base) MCG/ACT inhaler Inhale 2 puffs into the lungs every 6 (six) hours as needed for wheezing or shortness of breath. 18 g 3   amLODipine  (NORVASC ) 10 MG tablet Take 1 tablet (10 mg total) by mouth daily. 60 tablet 0   benzonatate  (TESSALON ) 100 MG capsule Take 1 capsule (100 mg total) by mouth 3 (three) times daily as needed for cough. 30 capsule 0   Blood Glucose Monitoring Suppl (ACCU-CHEK AVIVA PLUS) w/Device KIT USE AS DIRECTED 1 kit 0   Blood Glucose Monitoring Suppl DEVI 1 each by Does not apply route in the morning, at noon, and at bedtime. May substitute to any manufacturer covered by patient's insurance. 1 each 0   clonazePAM  (KLONOPIN ) 1 MG tablet Take 1 mg by mouth 2 (two) times daily as needed for anxiety.     cyanocobalamin  1000 MCG tablet Take 1 tablet (1,000 mcg total) by mouth daily. 60 tablet 0   doxycycline  (VIBRA -TABS) 100 MG tablet Take 1 tablet (100 mg total) by mouth 2 (  two) times daily. 14 tablet 0   fluticasone  (FLONASE ) 50 MCG/ACT nasal spray Place 2 sprays into both nostrils daily as needed for allergies or rhinitis. 1 mL 5   folic acid  (FOLVITE ) 1 MG tablet Take 1 tablet (1 mg total) by mouth daily. 30 tablet 0   furosemide  (LASIX ) 20 MG tablet Take 20 mg by mouth.     furosemide  (LASIX ) 20 MG tablet Take 20 mg by mouth daily.     insulin  glargine (LANTUS  SOLOSTAR) 100 UNIT/ML Solostar Pen Inject 40 Units into the skin daily. 25 mL 0   insulin  lispro (HUMALOG  KWIKPEN) 100 UNIT/ML KwikPen Inject 12 Units into the skin 3 (three) times daily. 25 mL 0   Insulin  Pen Needle 32G X 4 MM MISC 1 Device by Does not apply route in the morning, at noon, in the evening, and at bedtime. 100 each 0   ipratropium (ATROVENT ) 0.03 % nasal spray Place 2 sprays into both nostrils every 12 (twelve) hours. 30 mL 0   ipratropium-albuterol  (DUONEB) 0.5-2.5 (3) MG/3ML SOLN Take 3 mLs by nebulization in the morning, at noon, and at bedtime. 270  mL 5   lamoTRIgine  (LAMICTAL ) 150 MG tablet Take 150 mg by mouth at bedtime.     levothyroxine  (SYNTHROID ) 75 MCG tablet TAKE 1 TABLET BY MOUTH EVERY DAY BEFORE BREAKFAST 90 tablet 1   montelukast  (SINGULAIR ) 10 MG tablet TAKE 1 TABLET BY MOUTH EVERYDAY AT BEDTIME 90 tablet 3   Nebulizers (COMPRESSOR/NEBULIZER) MISC Use as directed 1 each 0   nicotine  (NICODERM CQ  - DOSED IN MG/24 HOURS) 21 mg/24hr patch Place 1 patch (21 mg total) onto the skin daily. 28 patch 0   nitroGLYCERIN  (NITROSTAT ) 0.4 MG SL tablet Place 1 tablet (0.4 mg total) under the tongue every 5 (five) minutes as needed for chest pain. 25 tablet 3   PARoxetine  (PAXIL ) 10 MG tablet Take 10 mg by mouth in the morning.     promethazine  (PHENERGAN ) 12.5 MG tablet Take 1 tablet (12.5 mg total) by mouth every 6 (six) hours as needed. 30 tablet 1   promethazine -dextromethorphan  (PROMETHAZINE -DM) 6.25-15 MG/5ML syrup Take 5 mLs by mouth 4 (four) times daily as needed. 118 mL 0   propranolol  (INDERAL ) 20 MG tablet Take 1 tablet (20 mg total) by mouth 3 (three) times daily. 270 tablet 3   Respiratory Therapy Supplies (NEBULIZER/TUBING/MOUTHPIECE) KIT Use as directed 10 kit 11   rosuvastatin  (CRESTOR ) 5 MG tablet Take 1 tablet (5 mg total) by mouth daily. 90 tablet 1   solifenacin  (VESICARE ) 5 MG tablet Take 1 tablet (5 mg total) by mouth daily. 30 tablet 2   terconazole (TERAZOL 7) 0.4 % vaginal cream Place 1 applicator vaginally at bedtime. 45 g 0   TYLENOL  500 MG tablet Take 500-1,000 mg by mouth every 6 (six) hours as needed for mild pain (pain score 1-3) or headache.     Vitamin D , Ergocalciferol , (DRISDOL ) 1.25 MG (50000 UNIT) CAPS capsule Take 50,000 Units by mouth every Wednesday.     ziprasidone  (GEODON ) 60 MG capsule Take 60 mg by mouth at bedtime.     No current facility-administered medications on file prior to visit.    Allergies  Allergen Reactions   Bactrim  [Sulfamethoxazole -Trimethoprim ] Anaphylaxis, Shortness Of Breath  and Other (See Comments)    Chest pain, trouble breathing   Ciprofloxacin Palpitations   Clindamycin /Lincomycin Other (See Comments)    Chest Pain   Flagyl  [Metronidazole ] Shortness Of Breath   Hydrocodone Nausea Only  Iodinated Contrast Media Anaphylaxis, Swelling and Other (See Comments)    Body swells, too   Ondansetron Shortness Of Breath   Liraglutide  Nausea And Vomiting and Other (See Comments)    Victoza    Doxycycline  Nausea And Vomiting   Metformin  And Related Other (See Comments)    Tore up my stomach   Penicillins Rash   Synjardy  Xr [Empagliflozin-Metformin  Hcl Er] Rash and Other (See Comments)    Diaper rash    Assessment/Plan:  1. Hyperlipidemia -  No problem-specific Assessment & Plan notes found for this encounter.    Thank you,  Melissa D Maccia, Pharm.Emily [name], CPP [facility] A Division of [facility] [address], [place], [place] [address]  Phone: [phone]; Fax: [phone]

## 2024-03-09 ENCOUNTER — Ambulatory Visit: Admitting: Pharmacist

## 2024-03-09 ENCOUNTER — Other Ambulatory Visit: Payer: Self-pay

## 2024-03-09 DIAGNOSIS — K769 Liver disease, unspecified: Secondary | ICD-10-CM | POA: Diagnosis not present

## 2024-03-09 DIAGNOSIS — K7460 Unspecified cirrhosis of liver: Secondary | ICD-10-CM | POA: Diagnosis not present

## 2024-03-09 DIAGNOSIS — R161 Splenomegaly, not elsewhere classified: Secondary | ICD-10-CM | POA: Diagnosis not present

## 2024-03-14 DIAGNOSIS — J129 Viral pneumonia, unspecified: Secondary | ICD-10-CM | POA: Diagnosis not present

## 2024-03-14 DIAGNOSIS — B348 Other viral infections of unspecified site: Secondary | ICD-10-CM | POA: Diagnosis not present

## 2024-03-15 ENCOUNTER — Ambulatory Visit: Payer: Self-pay | Admitting: Nurse Practitioner

## 2024-03-15 DIAGNOSIS — R070 Pain in throat: Secondary | ICD-10-CM | POA: Diagnosis not present

## 2024-03-15 NOTE — Telephone Encounter (Signed)
 [name]?: Action required by provider: request for appointment, update on patient condition, and does not want to go to UC as recommended, wants earlier appt .  Patient is followed in Pulmonology for lung nodules, asthma, last seen on 02/12/2024 by Parrett, Madelin RAMAN, NP.  Called Nurse Triage reporting Shortness of Breath.  Symptoms began several days ago.  Interventions attempted: Prescription medications: advair diskus, inhalers.  Symptoms are: gradually worsening.  Triage Disposition: See HCP Within 4 Hours (Or PCP Triage)  Patient/caregiver understands and will follow disposition?: No, wishes to speak with PCP     E2C2 Pulmonary Triage - Initial Assessment Questions "Chief Complaint (e.g., cough, sob, wheezing, fever, chills, sweat or additional symptoms) *Go to specific symptom protocol after initial questions. Worsening SOB with exertion , cough   "How long have symptoms been present?" Couple of days  Have you tested for COVID or Flu? Note: If not, ask patient if a home test can be taken. If so, instruct patient to call back for positive results. No  MEDICINES:   "Have you used any OTC meds to help with symptoms?" No If yes, ask "What medications?" na  "Have you used your inhalers/maintenance medication?" Yes If yes, "What medications?" Advair diskus, albuterol  inhaler   If inhaler, ask "How many puffs and how often?" Note: Review instructions on medication in the chart. As prescribed  OXYGEN : "Do you wear supplemental oxygen ?" No If yes, "How many liters are you supposed to use?" na  "Do you monitor your oxygen  levels?" Yes If yes, What is your reading (oxygen  level) today? 98%  What is your usual oxygen  saturation reading?  (Note: Pulmonary O2 sats should be 90% or greater) Na         Reason for Disposition  [1] MILD difficulty breathing (e.g., minimal/no SOB at rest, SOB with walking, pulse < 100) AND [2] NEW-onset or WORSE  than normal  Answer Assessment - Initial Assessment Questions Recommended UC no available appt today. Patient does not want to go to UC. Requesting earlier appt than 04/01/24. Earliest available 03/21/24. Patient requesting call back. Continued to advise UC and if sx worsen go to ED.       1. RESPIRATORY STATUS: Describe your breathing? (e.g., wheezing, shortness of breath, unable to speak, severe coughing)      Shortness of breath with exertion more than normal cough  2. ONSET: When did this breathing problem begin?      Couple days  3. PATTERN Does the difficult breathing come and go, or has it been constant since it started?      Comes and goes  4. SEVERITY: How bad is your breathing? (e.g., mild, moderate, severe)      Mild  5. RECURRENT SYMPTOM: Have you had difficulty breathing before? If Yes, ask: When was the last time? and What happened that time?      Yes  6. CARDIAC HISTORY: Do you have any history of heart disease? (e.g., heart attack, angina, bypass surgery, angioplasty)      na 7. LUNG HISTORY: Do you have any history of lung disease?  (e.g., pulmonary embolus, asthma, emphysema)     Hx lung nodule and asthma  8. CAUSE: What do you think is causing the breathing problem?      Not sure  9. OTHER SYMPTOMS: Do you have any other symptoms? (e.g., chest pain, cough, dizziness, fever, runny nose)     SOB with exertion more than normal, cough clear mucus at times.  10. O2 SATURATION MONITOR:  Do you use an oxygen  saturation monitor (pulse oximeter) at home? If Yes, ask: What is your reading (oxygen  level) today? What is your usual oxygen  saturation reading? (e.g., 95%)       O2 sat 98% RA 11. PREGNANCY: Is there any chance you are pregnant? When was your last menstrual period?       na 12. TRAVEL: Have you traveled out of the country in the last month? (e.g., travel history, exposures)       na  Protocols used: Breathing Difficulty-A-AH

## 2024-03-15 NOTE — Telephone Encounter (Signed)
 I called and spoke with patient, I let her know that we did not have any earlier appointments than 12/8 and that was not much sooner than her 12/12 appointment.  She said she had just gone to the UC and told her lungs were clear.  She was told she had thrush from not rinsing well after using her steroid inhaler.  I advised her to try an alcohol based mouth wash and gargle and rinse with that and see if that helps prevent the thrush.  She verbalized understanding.  She denied any increase sob or wheezing.  Advised her to call back if something changes with her condition.  Nothing further needed.

## 2024-03-17 ENCOUNTER — Encounter: Payer: Self-pay | Admitting: Nurse Practitioner

## 2024-03-17 NOTE — Progress Notes (Signed)
 03/17/24: Pt called answering service with complaints of mucus and throat clearing. Difficult to expel phlegm and feels congested. No significant cough. No issues with her breathing, chest tightness, wheezing. No fevers, hemoptysis. Oxygen  levels 98%. Advised to utilize OTC mucinex , Flonase  and saline nasal spray. Albuterol  inhaler/neb PRN SOB/wheezing. Notify if symptoms fail to improve. Advised to go to UC/ED if symptoms worsen with difficulties breathing. Verbalized understanding. All questions addressed.

## 2024-03-19 DIAGNOSIS — R059 Cough, unspecified: Secondary | ICD-10-CM | POA: Diagnosis not present

## 2024-03-21 ENCOUNTER — Telehealth: Payer: Self-pay | Admitting: Licensed Clinical Social Worker

## 2024-03-21 ENCOUNTER — Encounter: Payer: Self-pay | Admitting: Licensed Clinical Social Worker

## 2024-03-21 DIAGNOSIS — G4733 Obstructive sleep apnea (adult) (pediatric): Secondary | ICD-10-CM | POA: Diagnosis not present

## 2024-03-21 NOTE — Patient Instructions (Signed)
 Ruthell L Edberg - I am sorry I was unable to reach you today for our scheduled appointment. I work with [name], NP and am calling to support your healthcare needs. Please contact me at [phone] at your earliest convenience. I look forward to speaking with you soon.   Thank you,  Lyle Rung, BSW, MSW, LCSW Licensed Clinical Social Worker [employer]   [facility] [email or web address] Direct Dial : [phone]

## 2024-03-22 NOTE — Patient Instructions (Signed)
 Visit Information  Thank you for taking time to visit with me today. Please don't hesitate to contact me if I can be of assistance to you before our next scheduled telephone appointment.  Our next appointment is by telephone on 04/06/24 at 4pm  Following is a copy of your care plan:   Goals Addressed             This Visit's Progress    VBCI RN Care Plan       Problems:  Chronic Disease Management support and education needs related to Pulmonary Disease and dx of Chronic Bronchitis, Acute Asthma and recent hx of hospitalization for Acute Respiratory Failure w/ hypoxia  Goal: Over the next 4 months the Patient will Chronic Disease Management support and education needs related to Pulmonary Disease and dx of Chronic Bronchitis, Acute Asthma and recent hx of hospitalization for Acute Respiratory Failure w/ hypoxia  Interventions:   Asthma: Provided patient with basic written and verbal Asthma education on self care/management/and exacerbation prevention Advised patient to track and manage Asthma triggers Provided instruction about proper use of medications used for management of Asthma including inhalers Advised patient to self assesses Asthma action plan zone and make appointment with provider if in the yellow zone for 48 hours without improvement Advised patient to engage in light exercise as tolerated 3-5 days a week to aid in the the management of Asthma Provided education about and advised patient to utilize infection prevention strategies to reduce risk of respiratory infection Discussed the importance of adequate rest and management of fatigue with Asthma Screening for signs and symptoms of depression related to chronic disease state  Assessed social determinant of health barriers   Patient Self-Care Activities:  Attend all scheduled provider appointments Call pharmacy for medication refills 3-7 days in advance of running out of medications Call provider office for new  concerns or questions  Perform all self care activities independently  Perform IADL's (shopping, preparing meals, housekeeping, managing finances) independently Take medications as prescribed   Work with the social worker to address care coordination needs and will continue to work with the clinical team to address health care and disease management related needs  Plan:  The patient has been provided with contact information for the care management team and has been advised to call with any health related questions or concerns.  Referral made to LCSW with patient's okay for counseling regarding dealing with chronic disease issues in addition to dx of bipolar disorder, anxiety & depression Next appointment with RN Care Manager is scheduled for 04/06/24 at 4pm.             Care plan and visit instructions communicated with the patient verbally today. Patient agrees to receive a copy in MyChart. Active MyChart status and patient understanding of how to access instructions and care plan via MyChart confirmed with patient.     The patient has been provided with contact information for the care management team and has been advised to call with any health related questions or concerns.   Please call the care guide team at [phone] if you need to cancel or reschedule your appointment.   Please call 1-800-273-TALK (toll free, 24 hour hotline) if you are experiencing a Mental Health or Behavioral Health Crisis or need someone to talk to.  Takeya Marquis A. Gordy RN, BA, [facility], CRRN [place]  [facility] RN Care Manager Direct Dial : [phone]  Fax: [phone]

## 2024-03-22 NOTE — Patient Outreach (Signed)
 03/09/2024   Completed follow up appointment with patient - patient stated she is doing well regarding her respiratory challenges 9suffers from chronic bronchitis, asthma, and bouts of respiratory failure with hypoxia. Patient remains successfully weaned off oxygen , now going on 2 months.  Patient agreed to being referred to LCSW for counseling and an appt with Bobie Rung LCSW is noted for 03/21/24.  Clinically patient is stable, will schedule next RN Care Manager appt for next month, 04/06/24 at 4pm (4 weeks from today).  Roald Lukacs A. Gordy RN, BA, [facility], CRRN   [facility] RN Care Manager Direct Dial : [phone]  Fax: [phone]

## 2024-03-26 ENCOUNTER — Other Ambulatory Visit: Payer: Self-pay | Admitting: Cardiology

## 2024-03-27 DIAGNOSIS — R140 Abdominal distension (gaseous): Secondary | ICD-10-CM | POA: Diagnosis not present

## 2024-03-27 DIAGNOSIS — Z8719 Personal history of other diseases of the digestive system: Secondary | ICD-10-CM | POA: Diagnosis not present

## 2024-03-27 DIAGNOSIS — R609 Edema, unspecified: Secondary | ICD-10-CM | POA: Diagnosis not present

## 2024-03-29 DIAGNOSIS — J129 Viral pneumonia, unspecified: Secondary | ICD-10-CM | POA: Diagnosis not present

## 2024-03-29 DIAGNOSIS — I10 Essential (primary) hypertension: Secondary | ICD-10-CM | POA: Diagnosis not present

## 2024-03-29 DIAGNOSIS — R1084 Generalized abdominal pain: Secondary | ICD-10-CM | POA: Diagnosis not present

## 2024-03-29 DIAGNOSIS — B348 Other viral infections of unspecified site: Secondary | ICD-10-CM | POA: Diagnosis not present

## 2024-03-29 DIAGNOSIS — R197 Diarrhea, unspecified: Secondary | ICD-10-CM | POA: Diagnosis not present

## 2024-03-29 NOTE — Progress Notes (Signed)
 Patient ID: Emily [name] is a 43 y.o. (DOB 05/23/1980) female  Primary Care Provider : Philippe JONELLE Slade [facility] Provider:  Dr. Thresa Sale     Assessment: 1. Generalized abdominal pain   2. Diarrhea, unspecified type   3. Alcoholic cirrhosis of liver without ascites (*)     Plan:  1 - Generalized abdominal pain - will order STAT CT Abdomen without contrast, IV or oral, due to allergy to further evaluate. Will also order STAT labs. If any worsening pain, patient to proceed to ER. Continue with dicyclomine , can increase to 4x/day to help with pain.  2 - Diarrhea - will get GI stool studies to evaluate for infectious etiology due to recent onset and increased abdominal pain.    3 - Alcoholic cirrhosis - will repeat liver tests today to monitor.  Orders Placed This Encounter  Procedures   GI Profile, Stool, PCR Stool   CT Abdomen Pelvis WO IV Contrast ([facility] Protocol)   CBC And Differential   Comprehensive Metabolic Panel   Protime-INR   Magnesium   No follow-ups on file.      Risks, benefits, and alternatives of the medications and treatment plan prescribed today were discussed, and patient expressed understanding. Plan follow-up as discussed or as needed if any worsening symptoms or change in condition.  Please excuse any typos as this was dictated with a voice recognition device    Chief Complaint: Abdominal pain, diarrhea, gas   HPI Emily [name] is a 43 y.o. female with a past medical history of alcohol abuse with alcoholic cirrhosis/advanced fibrosis , anxiety, T2DM, GERD, hypothyroidism, abdominal pain with nausea seen today for abdominal pain, diarrhea, and gas. Patient presents today with her dad, Rutha. Patient states she has been having increased abdominal pain with a lot of flatulence that has sulfur smell and diarrhea for last week. She states she is having 3-4 episodes of diarrhea per day which are watery brown stools. She states she  has increased nausea with this as well. No vomiting. Decreased appetite. She denies any sick family contacts. She denies confusion or swelling. No blood noted in stool or melena. She states previous symptoms of visual hallucinations have resolved. She states she is having pain all over her abdomen but worse in upper abdomen. She is taking dicyclomine  twice a day but is not helping much with pain.   Past GI History:  EGD 07/2021  1. LA Grade B reflux esophagitis - biopsied 2. Multiple small superficial gastric antral ulcers - suspect NSAID related - biopsies obtained to rule out H. pylori 3. No esophageal nor gastric varices   Colonoscopy 11/2021  - The examined portion of the ileum was normal. - One 10 mm polyp in the sigmoid colon, removed with a cold snare. Resected and retrieved. - One 20 mm polyp at the recto-sigmoid colon, removed with a hot snare ~ 16 cm from the anal verge. One MR conditional hemoclip placed for bleeding prophylaxis. Resected and retrieved. - Three 4 to 7 mm polyps in the rectum, removed with a cold snare. Resected and retrieved. - Internal hemorrhoids. - The examination was otherwise normal on direct and retroflexion views. - Biopsies were taken with a cold forceps from the right colon and left colon for evaluation of microscopic colitis.   MR Abdomen WWO 03/2022  1. Hepatomegaly, cirrhosis, and severe fatty infiltration of the liver. No liver lesions. 2. Splenomegaly   CT A/P 07/09/2022    1. Multiple solid pulmonary  nodules in the visualized lung bases, measuring up to 8 mm, likely infectious/inflammatory. Routine CT chest without contrast is recommended for further evaluation.   2. Hepatomegaly and splenomegaly similar to prior exam.   3. Cholelithiasis without evidence of cholecystitis.     CT Chest 06/2022    1.  Scattered bilateral pulmonary nodules measuring up to 8 mm in diameter.  According to the 2017 Fleischner Society guidelines, multiple  nodules measuring between 6 and 8 mm in diameter necessitate a 3-6 month follow-up in a patient with no increased risk factors for lung cancer, with subsequent consideration of 18-24 month follow-up.  In a high-risk patient, a 3-6 month follow-up CT scan is recommended, followed by a scan at 18-24 months if there has been no change.    2. Faint groundglass opacity associated with some the pulmonary nodules particularly in the upper lobes. Suggest possible infectious or inflammatory component. Clinical correlation suggested. 3. Mild mediastinal adenopathy which could be reactive or neoplastic.      CT A/P WO 10/2022  Small pulmonary nodules in visualized lower lungs. Most appear smaller. Unchanged enlarged cirrhotic liver and mild splenomegaly. Mild coronary artery calcification.   RUQ US  03/2023  Fatty infiltration of the liver. There is cholelithiasis.    CT A/P WO 04/2023  No renal calculi hydronephrosis.   No bowel obstruction. Lobulated border of the liver consistent with hepatic cirrhosis.   Hepatosplenomegaly.   Wall thickening of the urinary bladder which can be seen with under distention versus cystitis.   5.1 mm subpleural left lower lobe pulmonary nodule. By the Fleischner criteria no further follow-up is recommended in the non this patient in the high risk patient a CT scan of the chest in one year is suggested.   EGD 09/08/2023 - normal - repeat 2 years    Flexible Sigmoidoscopy 08/2023  - internal hemorrhoids - otherwise normal to splenic flexure    CT A/P 11/11/23  1.  No acute abnormality involving the abdomen or pelvis. 2.  Small right adnexal hemorrhagic cyst and physiologic cyst at the left adnexa. No specific imaging follow-up required in a premenopausal patient. 3.  Cirrhotic liver morphology.   Liver Bx 10/20/2023        Chronic hepatitis consistent with steatohepatitis, see comment - Bridging fibrosis consistent with cirrhosis    MR Abdomen [facility] 12/2023  Cirrhotic  liver morphology. LR 3 observation of the posterior left hepatic lobe as above. Recommend follow-up with repeat study in 3-6 months for further evaluation.   CT A/P 02/08/2024  1. No acute inflammatory disease of the abdomen and pelvis. The appendix is normal. 2. Diverticulosis coli. 3. Hepatosplenomegaly. Hepatic cirrhosis. 4. Wide necked fat-containing umbilical hernia.    Past Medical History:  Diagnosis Date   Anxiety    Asthma (*)    Bipolar 1 disorder (*) 12/20/2020   Cirrhosis, alcoholic (*)    Depression    Diabetes mellitus (*)    Diabetes mellitus, type 2 (*)    Diabetic neuropathy (*)    Disease of thyroid gland    Fatty liver    GERD (gastroesophageal reflux disease)    Hyperlipidemia    Hypertension    Hypokalemia 07/11/2019   Low-normal folate (03/2023) 04/08/2023   Lymphocytosis 04/04/2023   Macrocytosis without anemia 04/04/2023   Miscarriage (*)    Necrotizing fasciitis (*)    Neutrophilia 04/04/2023   Parainfluenza 2025   Pneumonia    Renal failure    AKI, high Creatinine  Tachycardia    Tachycardia    Vaginal odor 09/30/2017    Past Surgical History:  Procedure Laterality Date   Abdominal surgery for neccrotizing fasciitis     Colonoscopy  12/06/2021   Dr. Amos [facility]   Dilation and curettage of uterus     Upper gastrointestinal endoscopy     Dr. Amos - [facility]    Family and Social History:   family history includes Asthma in her mother; Bladder Cancer in her paternal grandfather; Colon cancer in an other family member; Heart disease in her father; Hypertension in her mother; Lung disease in her mother; Prostate cancer in her paternal grandfather; Thyroid disease in her mother.   reports that she quit smoking about 26 years ago. Her smoking use included cigarettes. She has never used smokeless tobacco. She reports that she does not drink alcohol and does not use drugs.  Review of Systems:  Review of Systems   Constitutional:  Negative for appetite change, chills, fever and unexpected weight change.  HENT:  Negative for trouble swallowing.   Eyes: Negative.   Respiratory:  Negative for shortness of breath.   Cardiovascular:  Negative for chest pain.  Gastrointestinal:  Positive for abdominal distention, abdominal pain, diarrhea and nausea. Negative for anal bleeding, blood in stool, constipation, rectal pain and vomiting.  Skin:  Negative for rash.  Psychiatric/Behavioral:  Negative for confusion.      Physical Exam:  BP 111/79   Pulse 85   Temp 98.1 F (36.7 C)   Ht 5' 3 (1.6 m)   Wt 199 lb (90.3 kg)   BMI 35.25 kg/m    Physical Exam Vitals reviewed.  Constitutional:      General: She is not in acute distress.    Appearance: Normal appearance. She is not ill-appearing.  HENT:     Head: Normocephalic and atraumatic.  Eyes:     General: No scleral icterus.    Extraocular Movements: Extraocular movements intact.     Conjunctiva/sclera: Conjunctivae normal.     Pupils: Pupils are equal, round, and reactive to light.  Cardiovascular:     Rate and Rhythm: Normal rate and regular rhythm.     Pulses: Normal pulses.     Heart sounds: Normal heart sounds.  Musculoskeletal:        General: No swelling.  Pulmonary:     Effort: Pulmonary effort is normal. No respiratory distress.     Breath sounds: Normal breath sounds.  Abdominal:     General: Bowel sounds are normal. There is no distension.     Palpations: Abdomen is soft.     Tenderness: There is abdominal tenderness. There is no guarding or rebound.     Comments: Significant tenderness to palpation diffusely over abdomen, mild guarding, no rebound. STAT CT Abdomen/pelvis ordered without contrast due to contrast allergy  Skin:    General: Skin is warm and dry.  Neurological:     Mental Status: She is alert and oriented to person, place, and time.  Psychiatric:        Mood and Affect: Mood normal.        Behavior: Behavior  normal.        Thought Content: Thought content normal.        Judgment: Judgment normal.      Patient's Medications       * Accurate as of March 29, 2024 11:59 PM. Reflects encounter med changes as of last refresh  Continued Medications      Instructions  ACCU-CHEK GUIDE test strip Generic drug: glucose blood  SMARTSIG:3 Times Daily   ADVAIR DISKUS 250-50 MCG/ACT Aepb inhalation powder Generic drug: fluticasone -salmeterol  SMARTSIG:1 Puff(s) Via Inhaler Every 12 Hours   amLODIPine  besylate 5 mg tablet Commonly known as: NORVASC   5 mg, Oral, Daily   BD PEN NEEDLE NANO 2ND GEN 32G X 4 MM Misc Generic drug: Insulin  Pen Needle  USE 2 DAILY FOR ADMINISTRATION   clonazePAM  1 mg tablet Commonly known as: KLONOPIN   1 mg, 2 times a day   COMP AIR COMPRESSOR NEBULIZER Misc  AS DIRECTED   cyclobenzaprine  10 mg tablet Commonly known as: FLEXERIL   10 mg, 3 times a day as needed   dicyclomine  10 mg capsule Commonly known as: BENTYL   10 mg, Oral, 30 minutes before meals & at bedtime   ergocalciferol  50,000 units Caps capsule Commonly known as: Vitamin D2  1 capsule, Weekly at 0900   fluticasone  propionate 50 mcg/actuation nasal spray Commonly known as: FLONASE   2 sprays, Daily   folic acid  1 mg tablet  1 mg, Oral, Daily   furosemide  20 mg tablet Commonly known as: LASIX   20 mg, Oral, Daily   HUMALOG  KWIKPEN 100 UNIT/ML Sopn injection PEN Generic drug: insulin  lispro (1 Unit Dial )  INJECT TEN UNITS INTO THE SKIN 3 (THREE) TIMES DAILY WITH MEALS.   HYDROcodone-homatropine 5-1.5 mg/5 mL solution Commonly known as: HYCODAN,HYDROMET  5 mLs   INCRUSE ELLIPTA  62.5 MCG/ACT inhalation powder Generic drug: umeclidinium bromide   2 puffs, Daily   ipratropium 0.03% nasal spray Commonly known as: ATROVENT   SMARTSIG:2 Spray(s) Both Nares Every 12 Hours   ipratropium-albuterol  0.5-2.5 mg/3 mL ML Soln nebulizer solution Commonly known as: DUONEB  3 mLs,  Every 6 hours as needed   lamoTRIgine  150 mg tablet Commonly known as: LAMICTAL   150 mg, Oral, Daily   LANTUS  SOLOSTAR 100 UNIT/ML injection  15 Units, Daily   levothyroxine  sodium 75 mcg tablet Commonly known as: SYNTHROID ,LEVOTHROID,LEVOXYL   75 mcg, Oral, Daily   megestrol  40 mg tablet Commonly known as: MEGACE   3 tablets a day for 5 days, 2 tablets a day for 5 days then 1 tablet daily   metFORMIN  ER 500 mg 24 hr tablet Commonly known as: GLUCOPHAGE -XR  500 mg, 2 times a day   montelukast  10 MG tablet Commonly known as: SINGULAIR   10 mg, Oral, At bedtime   nicotine  21 mg/24 hours Commonly known as: HABITROL ,NICODERM CQ   1 patch, Transdermal, Every 24 hours   nifedipine 0.2%  1 Application, Topical, 3 times a day, Please compound nifedipine 0.2% cream with lidocaine  5% ointment for patient to apply rectally, externally and internally, TID. Please provide applicator.   nitroGLYCERIN  0.4 mg SL tablet Commonly known as: NITROSTAT   PLACE 1 TABLET UNDER THE TONGUE EVERY 5 MINUTES AS NEEDED FOR CHEST PAIN.   pantoprazole  sodium 40 mg tablet Commonly known as: PROTONIX   40 mg, Oral, 2 times a day   paroxetine  10 mg tablet Commonly known as: PAXIL   10 mg, At bedtime   prochlorperazine  10 MG tablet Commonly known as: COMPAZINE   10 mg, Oral, Every 6 hours as needed   promethazine  12.5 MG tablet Commonly known as: PHENERGAN   12.5 mg, Every 6 hours as needed   promethazine -dextromethorphan  6.25-15 MG/5ML syrup Commonly known as: PROMETHAZINE -DM  5 mLs, Every 4 hours as needed   propranolol  HCl 20 mg tablet Commonly known as: INDERAL   TAKE 1 TABLET BY MOUTH TWICE  A DAY   pseudoephedrine  HCl 120 mg 12 hr tablet Commonly known as: SUDAFED  120 mg, 2 times a day   rosuvastatin  calcium  5 mg tablet Commonly known as: CRESTOR   5 mg, Daily   SYNJARDY  XR 12.08-998 MG Tb24 tablet Generic drug: Empagliflozin-metFORMIN  HCl ER  2 tablets, Daily   terconazole  0.4%  vaginal cream Commonly known as: TERAZOL 7   1 applicator   traZODone 50 mg tablet Commonly known as: DESYREL  100 mg, At bedtime as needed   TRELEGY ELLIPTA  200-62.5-25 MCG/ACT Aepb inhaler Generic drug: fluticasone -umeclidin-vilant  1 puff, Daily   VENTOLIN  HFA 108 (90 Base) MCG/ACT inhaler Generic drug: albuterol  sulfate HFA  SMARTSIG:2 Puff(s) By Mouth Every 6 Hours PRN   vitamin B-12 1000 mcg tablet Commonly known as: CYANOCOBALAMIN   1,000 mcg, Daily   ziprasidone  60 mg capsule Commonly known as: GEODON   60 mg, Oral, At bedtime                                      *Some images could not be shown.

## 2024-03-30 ENCOUNTER — Ambulatory Visit: Admitting: Adult Health

## 2024-03-30 DIAGNOSIS — R1084 Generalized abdominal pain: Secondary | ICD-10-CM | POA: Diagnosis not present

## 2024-03-30 DIAGNOSIS — R197 Diarrhea, unspecified: Secondary | ICD-10-CM | POA: Diagnosis not present

## 2024-03-31 ENCOUNTER — Encounter: Payer: Self-pay | Admitting: Obstetrics & Gynecology

## 2024-04-01 ENCOUNTER — Encounter: Payer: Self-pay | Admitting: Adult Health

## 2024-04-01 ENCOUNTER — Ambulatory Visit: Admitting: Adult Health

## 2024-04-01 VITALS — BP 127/82 | HR 88 | Temp 97.9°F | Ht 63.5 in | Wt 201.0 lb

## 2024-04-01 DIAGNOSIS — J4540 Moderate persistent asthma, uncomplicated: Secondary | ICD-10-CM | POA: Diagnosis not present

## 2024-04-01 DIAGNOSIS — F17210 Nicotine dependence, cigarettes, uncomplicated: Secondary | ICD-10-CM | POA: Diagnosis not present

## 2024-04-01 DIAGNOSIS — G4733 Obstructive sleep apnea (adult) (pediatric): Secondary | ICD-10-CM

## 2024-04-01 DIAGNOSIS — K029 Dental caries, unspecified: Secondary | ICD-10-CM

## 2024-04-01 DIAGNOSIS — J310 Chronic rhinitis: Secondary | ICD-10-CM | POA: Diagnosis not present

## 2024-04-01 DIAGNOSIS — R911 Solitary pulmonary nodule: Secondary | ICD-10-CM

## 2024-04-01 DIAGNOSIS — R918 Other nonspecific abnormal finding of lung field: Secondary | ICD-10-CM

## 2024-04-01 MED ORDER — BREZTRI AEROSPHERE 160-9-4.8 MCG/ACT IN AERO: 2.0000 | INHALATION_SPRAY | Freq: Two times a day (BID) | RESPIRATORY_TRACT | Status: DC

## 2024-04-01 MED ORDER — BREZTRI AEROSPHERE 160-9-4.8 MCG/ACT IN AERO: 2.0000 | INHALATION_SPRAY | Freq: Two times a day (BID) | RESPIRATORY_TRACT | 5 refills | Status: AC

## 2024-04-01 MED ORDER — FLUTICASONE PROPIONATE 50 MCG/ACT NA SUSP: 2.0000 | Freq: Every day | NASAL | 5 refills | Status: AC | PRN

## 2024-04-01 NOTE — Progress Notes (Signed)
 @Patient  ID: Emily [name], female    DOB: [birth year], 43 y.o.   MRN: [id number]  Chief Complaint  Patient presents with   Medical Management of Chronic Issues    OSA and asthma f/u    Referring provider: [name], NP  HPI: 43 year old female former smoker (heavy smoking history) followed for chronic asthmatic bronchitis Medical history significant for diabetes, bipolar disorder and panic disorder Prolonged hospitalization August 2025 with severe sepsis secondary to parainfluenza and pneumonia    TEST/EVENTS : Reviewed 04/01/2024  02/12/2024 appears well no acute distress   Labs: 2020 :  Ace - 81 [id number] Esr - 30 Rh<14 CCP<16 ANCA - negative  Hypersensitive activity pneumonitis panel negative Anti-Jo 1 less than 0.2 RNP antibodies greater than 8 Sjogren's syndrome antibodies negative Anti-scleroderma antibody less than 1 Aldolase 4.5 MPO/PR-3 (ANCA) antibodies-less than 1 Total CK 47, CK-MB less than 7 -negative     04/01/2018-CT chest high resolution- few patchy areas of mild groundglass attenuation in the lungs bilaterally nonspecific, stable pulmonary nodules from 2009    feNO - 01/26/2018 - 5 ppbd and is normal    01/08/2015- spirometry- FVC 1.33 (38% predicted), ratio 72, FEV1 1.01 (37% predicted)   High-resolution CT chest July 25, 2020 bandlike scarring left base, no evidence of interstitial lung disease, mild air trapping, fine centrilobular nodularity concentrated in the lung apices consistent with smoking-related bronchiolitis. Neg for ILD .   CT chest February 29, 2024 near complete resolution of previously extensive ground glass and consolidative airspace opacity consistent with a nearly resolved multifocal infection, 0.7 cm right lung base nodule  Sleep study February 08, 2024 that showed moderate sleep apnea the AHI 26.3/hour and SpO2 low at 83%. (Started on CPAP therapy November 2025)  Discussed the use of AI scribe software for clinical  note transcription with the patient, who gave verbal consent to proceed.  History of Present Illness Emily [name] is a 43 year old female followed for chronic asthmatic bronchitis is here for a 6-week follow-up  Patient was hospitalized in August for severe sepsis with pneumonia secondary to parainfluenza virus.  Patient is clinically improving.  She has a significant smoking history but quit after hospital discharge.  Recent CT chest on February 29, 2024 showed near complete resolution of previously extensive ground glass and consolidative airspace opacity consistent with a nearly resolved multifocal infection.  There was a small 7 mm right lung base nodule.  We discussed a follow-up CT in done in 6 months.  Patient says she is doing better.  However she continues to get short of breath has intermittent cough .  Wheezing has resolved.  She remains on Advair twice daily.  DuoNeb 3 times daily.  Previously tried Trelegy but caused nausea.  She remains on Singulair  and Flonase  for chronic allergies.  Denies any flare.  Recently diagnosed with moderate obstructive sleep apnea.  She was started on CPAP therapy.  Patient says she just recently got her new machine.  We discussed the importance of daily usage.  She is currently on CPAP AutoSet 5 to 15 cm H2O.. She recently started using a CPAP machine, which she finds helpful, although she is waiting for a new mask due to facial irritation.  She is also taking Singulair  and Flonase  for allergies. She has tried Trelegy in the past but experienced adverse effects, such as vomiting. She has not tried Breztri  yet.  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 She is seeing a liver specialist and is scheduled to see a liver transplant doctor soon.   No wheezing, hemoptysis, leg swelling, or significant changes in eating habits. She reports being hoarse and frequently  clearing her throat.      Allergies[1]  Immunization History  Administered Date(s) Administered   Influenza Split 03/22/2014   Influenza Whole 01/13/2008   Influenza, Quadrivalent, Recombinant, Inj, Pf 01/23/2022   Influenza, Seasonal, Injecte, Preservative Fre 04/18/2015   Influenza,inj,Quad PF,6+ Mos 04/18/2015, 01/08/2022   PFIZER(Purple Top)SARS-COV-2 Vaccination 03/12/2020, 04/16/2020   Pneumococcal Polysaccharide-23 05/10/2012   Tdap 01/02/2006, 08/08/2015    Past Medical History:  Diagnosis Date   Abnormal CT of liver    Anxiety    Asthma    Asthma    Bipolar 1 disorder (HCC)    Bipolar disorder (HCC)    Chronic pain    Cirrhosis (HCC)    Depression    Diabetes mellitus without complication (HCC)    Fatty liver    Gallstones    GERD (gastroesophageal reflux disease)    Headache(784.0)    Hyperlipidemia    Hypertension    Hypothyroidism    Lupus (systemic lupus erythematosus) (HCC)    Pulmonary nodule    Tachycardia    Thyroid disease    Vitamin D  deficiency     Tobacco History: Tobacco Use History[2] Counseling given: Not Answered Tobacco comments: Stopped smoking 12/04/2023   Outpatient Medications Prior to Visit  Medication Sig Dispense Refill   ACCU-CHEK AVIVA PLUS test strip 1 each by Other route 3 (three) times daily.     ACCU-CHEK FASTCLIX LANCETS MISC USE AS DIRECTED 102 each 3   ADVAIR DISKUS 250-50 MCG/ACT AEPB SMARTSIG:1 Puff(s) Via Inhaler Every 12 Hours     albuterol  (VENTOLIN  HFA) 108 (90 Base) MCG/ACT inhaler Inhale 2 puffs into the lungs every 6 (six) hours as needed for wheezing or shortness of breath. 18 g 3   amLODipine  (NORVASC ) 10 MG tablet Take 1 tablet (10 mg total) by mouth daily. 60 tablet 0   benzonatate  (TESSALON ) 100 MG capsule Take 1 capsule (100 mg total) by mouth 3 (three) times daily as needed for cough. 30 capsule 0   Blood Glucose Monitoring Suppl (ACCU-CHEK AVIVA PLUS) w/Device KIT USE AS DIRECTED 1 kit 0   clonazePAM   (KLONOPIN ) 1 MG tablet Take 1 mg by mouth 2 (two) times daily as needed for anxiety.     cyanocobalamin  1000 MCG tablet Take 1 tablet (1,000 mcg total) by mouth daily. 60 tablet 0   fluticasone  (FLONASE ) 50 MCG/ACT nasal spray Place 2 sprays into both nostrils daily as needed for allergies or rhinitis. 1 mL 5   folic acid  (FOLVITE ) 1 MG tablet Take 1 tablet (1 mg total) by mouth daily. 30 tablet 0   furosemide  (LASIX ) 20 MG tablet Take 20 mg by mouth daily.     insulin  glargine (LANTUS  SOLOSTAR) 100 UNIT/ML Solostar Pen Inject 40 Units into the skin daily. 25 mL 0   insulin  lispro (HUMALOG  KWIKPEN) 100 UNIT/ML KwikPen Inject 12 Units into the skin 3 (three) times daily. 25 mL 0   Insulin  Pen Needle 32G X 4 MM MISC 1 Device by Does not apply route in the morning, at noon, in the evening, and at bedtime. 100 each 0   ipratropium-albuterol  (DUONEB) 0.5-2.5 (3) MG/3ML SOLN Take 3 mLs by nebulization in the morning, at noon, and at bedtime. 270 mL  5   lamoTRIgine  (LAMICTAL ) 150 MG tablet Take 150 mg by mouth at bedtime.     levothyroxine  (SYNTHROID ) 75 MCG tablet TAKE 1 TABLET BY MOUTH EVERY DAY BEFORE BREAKFAST 90 tablet 1   montelukast  (SINGULAIR ) 10 MG tablet TAKE 1 TABLET BY MOUTH EVERYDAY AT BEDTIME 90 tablet 3   Nebulizers (COMPRESSOR/NEBULIZER) MISC Use as directed 1 each 0   nicotine  (NICODERM CQ  - DOSED IN MG/24 HOURS) 21 mg/24hr patch Place 1 patch (21 mg total) onto the skin daily. 28 patch 0   nitroGLYCERIN  (NITROSTAT ) 0.4 MG SL tablet Place 1 tablet (0.4 mg total) under the tongue every 5 (five) minutes as needed for chest pain. 25 tablet 3   PARoxetine  (PAXIL ) 10 MG tablet Take 10 mg by mouth in the morning.     promethazine  (PHENERGAN ) 12.5 MG tablet Take 1 tablet (12.5 mg total) by mouth every 6 (six) hours as needed. 30 tablet 1   propranolol  (INDERAL ) 20 MG tablet TAKE 1 TABLET BY MOUTH THREE TIMES A DAY 90 tablet 0   Respiratory Therapy Supplies (NEBULIZER/TUBING/MOUTHPIECE) KIT Use  as directed 10 kit 11   TYLENOL  500 MG tablet Take 500-1,000 mg by mouth every 6 (six) hours as needed for mild pain (pain score 1-3) or headache.     Vitamin D , Ergocalciferol , (DRISDOL ) 1.25 MG (50000 UNIT) CAPS capsule Take 50,000 Units by mouth every Wednesday.     ziprasidone  (GEODON ) 60 MG capsule Take 60 mg by mouth at bedtime.     Blood Glucose Monitoring Suppl DEVI 1 each by Does not apply route in the morning, at noon, and at bedtime. May substitute to any manufacturer covered by patient's insurance. (Patient not taking: Reported on 04/01/2024) 1 each 0   furosemide  (LASIX ) 20 MG tablet Take 20 mg by mouth. (Patient not taking: Reported on 04/01/2024)     ipratropium (ATROVENT ) 0.03 % nasal spray Place 2 sprays into both nostrils every 12 (twelve) hours. (Patient not taking: Reported on 04/01/2024) 30 mL 0   promethazine -dextromethorphan  (PROMETHAZINE -DM) 6.25-15 MG/5ML syrup Take 5 mLs by mouth 4 (four) times daily as needed. (Patient not taking: Reported on 04/01/2024) 118 mL 0   rosuvastatin  (CRESTOR ) 5 MG tablet Take 1 tablet (5 mg total) by mouth daily. (Patient not taking: Reported on 04/01/2024) 90 tablet 1   solifenacin  (VESICARE ) 5 MG tablet Take 1 tablet (5 mg total) by mouth daily. (Patient not taking: Reported on 04/01/2024) 30 tablet 2   terconazole  (TERAZOL 7 ) 0.4 % vaginal cream Place 1 applicator vaginally at bedtime. (Patient not taking: Reported on 04/01/2024) 45 g 0   doxycycline  (VIBRA -TABS) 100 MG tablet Take 1 tablet (100 mg total) by mouth 2 (two) times daily. 14 tablet 0   No facility-administered medications prior to visit.     Review of Systems:   Constitutional:   No  weight loss, night sweats,  Fevers, chills,+ fatigue, or  lassitude.  HEENT:   No headaches,  Difficulty swallowing,  Tooth/dental problems, or  Sore throat,                No sneezing, itching, ear ache,+ nasal congestion, post nasal drip,   CV:  No chest pain,  Orthopnea, PND, swelling in  lower extremities, anasarca, dizziness, palpitations, syncope.   GI  No heartburn, indigestion, abdominal pain, nausea, vomiting, diarrhea, change in bowel habits, loss of appetite, bloody stools.   Resp: .  No chest wall deformity  Skin: no rash or lesions.  GU: no  dysuria, change in color of urine, no urgency or frequency.  No flank pain, no hematuria   MS:  No joint pain or swelling.  No decreased range of motion.  No back pain.    Physical Exam  BP 127/82   Pulse 76   Temp 97.9 F (36.6 C)   Ht 5' 3.5 (1.613 m) Comment: Per pt  Wt 201 lb (91.2 kg) Comment: Per pt  SpO2 97% Comment: RA  BMI 35.05 kg/m   GEN: A/Ox3; pleasant , NAD, well nourished    HEENT:  [place]/AT, , NOSE-clear, THROAT-clear, no lesions, no postnasal drip or exudate noted. Poor dentition   NECK:  Supple w/ fair ROM; no JVD; normal carotid impulses w/o bruits; no thyromegaly or nodules palpated; no lymphadenopathy.    RESP  Clear  P & A; w/o, wheezes/ rales/ or rhonchi. no accessory muscle use, no dullness to percussion  CARD:  RRR, no m/r/g, no peripheral edema, pulses intact, no cyanosis or clubbing.  GI:   Soft & nt; nml bowel sounds; no organomegaly or masses detected.   Musco: Warm bil, no deformities or joint swelling noted.   Neuro: alert, no focal deficits noted.    Skin: Warm, no lesions or rashes    Lab Results:Reviewed 04/01/2024   CBC    Component Value Date/Time   WBC 15.1 (H) 12/13/2023 0234   RBC 3.70 (L) 12/13/2023 0234   HGB 12.9 12/13/2023 0234   HGB 11.9 05/02/2022 1617   HCT 38.1 12/13/2023 0234   HCT 33.9 (L) 05/02/2022 1617   PLT 152 12/13/2023 0234   PLT 221 05/02/2022 1617   MCV 103.0 (H) 12/13/2023 0234   MCV 115 (H) 05/02/2022 1617   MCH 34.9 (H) 12/13/2023 0234   MCHC 33.9 12/13/2023 0234   RDW 14.8 12/13/2023 0234   RDW 11.9 05/02/2022 1617   LYMPHSABS 2.2 12/09/2023 0332   MONOABS 0.3 12/09/2023 0332   EOSABS 0.0 12/09/2023 0332   BASOSABS 0.0  12/09/2023 0332    BMET    Component Value Date/Time   NA 138 12/13/2023 0234   NA 135 05/02/2022 1617   K 3.8 12/13/2023 0234   CL 98 12/13/2023 0234   CO2 29 12/13/2023 0234   GLUCOSE 124 (H) 12/13/2023 0234   BUN 11 12/13/2023 0234   BUN 5 (L) 05/02/2022 1617   CREATININE 0.46 12/13/2023 0234   CREATININE 0.60 02/20/2015 0001   CALCIUM  8.9 12/13/2023 0234   GFRNONAA >60 12/13/2023 0234   GFRAA 129 06/01/2018 1114    BNP    Component Value Date/Time   BNP 32.9 12/08/2023 0400    ProBNP No results found for: PROBNP  Imaging: No results found.  Administration History     None           No data to display          Lab Results  Component Value Date   NITRICOXIDE 5 01/26/2018        No data to display              Assessment & Plan:   Assessment and Plan Assessment & Plan Moderate persistent chronic asthmatic bronchitis-ongoing daily symptom burden  PFT pending .  Heavy smoking history, suspect patient has a component of COPD.  Walk test today in the office without desaturations on room air encouraged on ongoing smoking cessation.  Will change Advair to triple therapy inhaler with Breztri  twice daily May use albuterol  or DuoNeb as needed continue  on trigger prevention.A breathing test is ordered for the next visit. Breztri  inhaler is prescribed, and she is instructed to use DuoNeb as needed. A spacer is provided for Breztri , and she is advised to brush, rinse, and gargle after use.  Tobacco abuse-ongoing smoking cessation is encouraged.  Poor dentition follow-up with dentist as discussed  Pulmonary nodule-noted on CT imaging.  Follow-up surveillance scan is planned for May 2026  Obstructive sleep apnea  -untreated-moderate obstructive sleep apnea.  Patient is encouraged on CPAP compliance. CPAP therapy was recently started and is beneficial, but a new mask is needed due to skin irritation. CPAP adherence is crucial. She is encouraged to use  CPAP with the new mask and instructed to keep the CPAP equipment clean and rinse the water chamber. - discussed how weight can impact sleep and risk for sleep disordered breathing - discussed options to assist with weight loss: combination of diet modification, cardiovascular and strength training exercises   - had an extensive discussion regarding the adverse health consequences related to untreated sleep disordered breathing - specifically discussed the risks for hypertension, coronary artery disease, cardiac dysrhythmias, cerebrovascular disease, and diabetes - lifestyle modification discussed   - discussed how sleep disruption can increase risk of accidents, particularly when driving - safe driving practices were discussed   Plan  Patient Instructions  Change Advair to Breztri  2 puffs Twice daily, rinse after use. With spacer  Albuterol  inhaler or Duoneb As needed    Continue on Singulair  daily  Continue on Flonase  nasal daily   Restart on CPAP At bedtime, wear all night long  Do not drive if sleepy   CT chest in May 2026   Follow up with in 3 months with Dr. Geronimo or Dann Galicia NP and .As needed  with PFT           Madelin Stank, NP 04/01/2024  I spent   minutes dedicated to the care of this patient on the date of this encounter to include pre-visit review of records, face-to-face time with the patient discussing conditions above, post visit ordering of testing, clinical documentation with the electronic health record, making appropriate referrals as documented, and communicating necessary findings to members of the patients care team.      [1]  Allergies Allergen Reactions   Bactrim  [Sulfamethoxazole -Trimethoprim ] Anaphylaxis, Shortness Of Breath and Other (See Comments)    Chest pain, trouble breathing   Ciprofloxacin Palpitations   Clindamycin /Lincomycin Other (See Comments)    Chest Pain   Flagyl  [Metronidazole ] Shortness Of Breath   Hydrocodone Nausea Only    Iodinated Contrast Media Anaphylaxis, Swelling and Other (See Comments)    Body swells, too   Ondansetron Shortness Of Breath   Liraglutide  Nausea And Vomiting and Other (See Comments)    Victoza    Doxycycline  Nausea And Vomiting   Metformin  And Related Other (See Comments)    Tore up my stomach   Penicillins Rash   Synjardy  Xr [Empagliflozin-Metformin  Hcl Er] Rash and Other (See Comments)    Diaper rash  [2]  Social History Tobacco Use  Smoking Status Former   Current packs/day: 0.00   Types: Cigarettes   Quit date: 12/04/2023   Years since quitting: 0.3   Passive exposure: Never  Smokeless Tobacco Never  Tobacco Comments   Stopped smoking 12/04/2023

## 2024-04-01 NOTE — Patient Instructions (Addendum)
 Change Advair to Breztri  2 puffs Twice daily, rinse after use. With spacer  Albuterol  inhaler or Duoneb As needed    Continue on Singulair  daily  Continue on Flonase  nasal daily   Restart on CPAP At bedtime, wear all night long  Do not drive if sleepy   CT chest in May 2026   Follow up with in 3 months with Dr. Geronimo or Nielle Duford NP and .As needed  with PFT

## 2024-04-06 ENCOUNTER — Other Ambulatory Visit

## 2024-04-07 ENCOUNTER — Other Ambulatory Visit: Payer: Self-pay | Admitting: Licensed Clinical Social Worker

## 2024-04-07 DIAGNOSIS — F4310 Post-traumatic stress disorder, unspecified: Secondary | ICD-10-CM

## 2024-04-07 DIAGNOSIS — F332 Major depressive disorder, recurrent severe without psychotic features: Secondary | ICD-10-CM

## 2024-04-07 DIAGNOSIS — F411 Generalized anxiety disorder: Secondary | ICD-10-CM

## 2024-04-07 DIAGNOSIS — A498 Other bacterial infections of unspecified site: Secondary | ICD-10-CM | POA: Diagnosis not present

## 2024-04-07 DIAGNOSIS — I10 Essential (primary) hypertension: Secondary | ICD-10-CM | POA: Diagnosis not present

## 2024-04-07 MED ORDER — FUROSEMIDE 20 MG PO TABS: 20.0000 mg | ORAL_TABLET | Freq: Every day | ORAL | 0 refills | Status: DC | PRN

## 2024-04-07 NOTE — Telephone Encounter (Signed)
 Yes , please establish with primary care

## 2024-04-07 NOTE — Patient Outreach (Signed)
 Complex Care Management   Visit Note  04/07/2024  Name:  Emily [name] MRN: [id number] DOB: 1980-05-31  Situation: Referral received for Complex Care Management related to Mental/Behavioral Health diagnosis PTSD. I obtained verbal consent from Patient.  Visit completed with Patient  on the phone  Background:   Past Medical History:  Diagnosis Date   Abnormal CT of liver    Anxiety    Asthma    Asthma    Bipolar 1 disorder (HCC)    Bipolar disorder (HCC)    Chronic pain    Cirrhosis (HCC)    Depression    Diabetes mellitus without complication (HCC)    Fatty liver    Gallstones    GERD (gastroesophageal reflux disease)    Headache(784.0)    Hyperlipidemia    Hypertension    Hypothyroidism    Lupus (systemic lupus erythematosus) (HCC)    Pulmonary nodule    Tachycardia    Thyroid disease    Vitamin D  deficiency     Assessment: Patient Reported Symptoms:  Cognitive Cognitive Status: Able to follow simple commands, Alert and oriented to person, place, and time, Normal speech and language skills Cognitive/Intellectual Conditions Management [RPT]: Behavior Disorders Behavior Disorders: Panic Disorder      Neurological Neurological Review of Symptoms: No symptoms reported    HEENT HEENT Symptoms Reported: Other:, Eye dryness (Swelling of the eyes) HEENT Management Strategies: Coping strategies, Medication therapy, Routine screening HEENT Self-Management Outcome: 2 (bad) HEENT Comment: PCP updated on 12/18    Cardiovascular Cardiovascular Symptoms Reported: No symptoms reported    Respiratory Respiratory Symptoms Reported: No symptoms reported    Endocrine Endocrine Symptoms Reported: Weakness or fatigue Is patient diabetic?: Yes    Gastrointestinal Gastrointestinal Symptoms Reported: No symptoms reported      Genitourinary Genitourinary Symptoms Reported: Not assessed    Integumentary Integumentary Symptoms Reported: No symptoms reported     Musculoskeletal Musculoskelatal Symptoms Reviewed: Not assessed        Psychosocial Psychosocial Symptoms Reported: Anger Additional Psychological Details: BH talk therapy referral placed today Behavioral Management Strategies: Coping strategies, Adequate rest, Medication therapy Behavioral Health Self-Management Outcome: 2 (bad) Behavioral Health Comment: I have a lot of anger over my health situation. Major Change/Loss/Stressor/Fears (CP): Medical condition, self Techniques to Cope with Loss/Stress/Change: Diversional activities, Medication Quality of Family Relationships: helpful, involved Do you feel physically threatened by others?: No    04/07/2024    PHQ2-9 Depression Screening   Little interest or pleasure in doing things Several days  Feeling down, depressed, or hopeless More than half the days  PHQ-2 - Total Score 3  Trouble falling or staying asleep, or sleeping too much Not at all  Feeling tired or having little energy Several days  Poor appetite or overeating  Not at all  Feeling bad about yourself - or that you are a failure or have let yourself or your family down Several days  Trouble concentrating on things, such as reading the newspaper or watching television Not at all  Moving or speaking so slowly that other people could have noticed.  Or the opposite - being so fidgety or restless that you have been moving around a lot more than usual Not at all  Thoughts that you would be better off dead, or hurting yourself in some way Not at all  PHQ2-9 Total Score 5  If you checked off any problems, how difficult have these problems made it for you to do your work, take care of things  at home, or get along with other people Somewhat difficult  Depression Interventions/Treatment Currently on Treatment, Counseling, Medication, Community Resources Provided (Referral to [facility] therapist)   SDOH Interventions    Flowsheet Row Patient Outreach Telephone from 04/07/2024 in [place]  [facility] DEPART[facility] Telephone from 12/15/2023 in [place] POPULATION HEALTH DEPARTMENT  SDOH Interventions    Food Insecurity Interventions Intervention Not Indicated Intervention Not Indicated  Housing Interventions Intervention Not Indicated Intervention Not Indicated  Transportation Interventions Payor Benefit --  Utilities Interventions -- Intervention Not Indicated  Depression Interventions/Treatment  Currently on Treatment, Counseling, Medication, Community Resources Provided  [employer] [facility] [employer] therapist] --  Stress Interventions [facility] Provided, Provide Counseling --      01/19/2024    1:37 PM 02/16/2023    3:57 PM 12/01/2022    1:58 PM 09/24/2022    2:00 PM  GAD 7 : Generalized Anxiety Score  Nervous, Anxious, on Edge 0 0 0 0  Control/stop worrying 0 0 0 0  Worry too much - different things 0 0 0 0  Trouble relaxing 0 0 0 0  Restless 0 0 0 0  Easily annoyed or irritable 0 0 1 0  Afraid - awful might happen 0 0 0 0  Total GAD 7 Score 0 0 1 0  Anxiety Difficulty   Not difficult at all Not difficult at all    There were no vitals filed for this visit.    Medications Reviewed Today     Reviewed by Merlynn Lyle CROME, LCSW (Social Worker) on 04/07/24 at 1501  Med List Status: <None>   Medication Order Taking? Sig Documenting Provider Last Dose Status Informant  ACCU-CHEK AVIVA PLUS test strip [id number] Yes 1 each by Other route 3 (three) times daily. [provider]  Active   ACCU-CHEK FASTCLIX LANCETS MISC [id number] Yes USE AS DIRECTED Tysinger, Alm RAMAN, PA-C  Active   albuterol  (VENTOLIN  HFA) 108 (90 Base) MCG/ACT inhaler [id number] Yes Inhale 2 puffs into the lungs every 6 (six) hours as needed for wheezing or shortness of breath. Ruthell Lauraine FALCON, NP  Active Self  amLODipine  (NORVASC ) 10 MG tablet [id number] Yes Take 1 tablet (10 mg total) by mouth daily. Jillian Buttery, MD  Active   benzonatate  (TESSALON ) 100 MG capsule [id number] Yes  Take 1 capsule (100 mg total) by mouth 3 (three) times daily as needed for cough. Gladis Elsie BROCKS, PA-C  Active Self  Blood Glucose Monitoring Suppl (ACCU-CHEK AVIVA PLUS) w/Device PRESSLEY [id number] Yes USE AS DIRECTED Bulah Alm RAMAN, PA-C  Active Self  Blood Glucose Monitoring Suppl DEVI [id number]  1 each by Does not apply route in the morning, at noon, and at bedtime. May substitute to any manufacturer covered by patient's insurance.  Patient not taking: Reported on 04/01/2024   Komal, Motwani, MD  Active Self  budesonide -glycopyrrolate-formoterol (BREZTRI  AEROSPHERE) 160-9-4.8 MCG/ACT AERO inhaler [id number]  Inhale 2 puffs into the lungs in the morning and at bedtime. Parrett, Madelin RAMAN, NP  Active   budesonide -glycopyrrolate-formoterol (BREZTRI  AEROSPHERE) 160-9-4.8 MCG/ACT AERO inhaler [id number]  Inhale 2 puffs into the lungs in the morning and at bedtime. Parrett, Madelin RAMAN, NP  Active   clonazePAM  (KLONOPIN ) 1 MG tablet [id number] Yes Take 1 mg by mouth 2 (two) times daily as needed for anxiety. [provider]  Active Self  cyanocobalamin  1000 MCG tablet [id number] Yes Take 1 tablet (1,000 mcg total) by mouth daily. Jillian Buttery, MD  Active   fluticasone  (FLONASE ) 50 MCG/ACT  nasal spray [id number]  Place 2 sprays into both nostrils daily as needed for allergies or rhinitis. Parrett, Madelin RAMAN, NP  Active   folic acid  (FOLVITE ) 1 MG tablet [id number] Yes Take 1 tablet (1 mg total) by mouth daily. Jillian Buttery, MD  Active   furosemide  (LASIX ) 20 MG tablet [id number]  Take 20 mg by mouth.  Patient not taking: Reported on 04/01/2024   [provider]  Active   furosemide  (LASIX ) 20 MG tablet [id number] Yes Take 20 mg by mouth daily. [provider]  Active   insulin  glargine (LANTUS  SOLOSTAR) 100 UNIT/ML Solostar Pen [id number] Yes Inject 40 Units into the skin daily. Jillian Buttery, MD  Active   insulin  lispro (HUMALOG  KWIKPEN) 100 UNIT/ML KwikPen [id number] Yes Inject 12  Units into the skin 3 (three) times daily. Jillian Buttery, MD  Active   Insulin  Pen Needle 32G X 4 MM MISC [id number] Yes 1 Device by Does not apply route in the morning, at noon, in the evening, and at bedtime. Jillian Buttery, MD  Active   ipratropium (ATROVENT ) 0.03 % nasal spray [id number]  Place 2 sprays into both nostrils every 12 (twelve) hours.  Patient not taking: Reported on 04/01/2024   Gladis Elsie BROCKS, PA-C  Active Self  ipratropium-albuterol  (DUONEB) 0.5-2.5 (3) MG/3ML SOLN [id number] Yes Take 3 mLs by nebulization in the morning, at noon, and at bedtime. Geronimo Amel, MD  Active Self  lamoTRIgine  (LAMICTAL ) 150 MG tablet [id number] Yes Take 150 mg by mouth at bedtime. [provider]  Active Self  levothyroxine  (SYNTHROID ) 75 MCG tablet [id number] Yes TAKE 1 TABLET BY MOUTH EVERY DAY BEFORE BREAKFAST Williamson, Joanna R, NP  Active   montelukast  (SINGULAIR ) 10 MG tablet [id number] Yes TAKE 1 TABLET BY MOUTH EVERYDAY AT BEDTIME Parrett, Madelin RAMAN, NP  Active Self  Nebulizers (COMPRESSOR/NEBULIZER) MISC [id number] Yes Use as directed Geronimo Amel, MD  Active Self  nicotine  (NICODERM CQ  - DOSED IN MG/24 HOURS) 21 mg/24hr patch [id number] Yes Place 1 patch (21 mg total) onto the skin daily. Jillian Buttery, MD  Active   nitroGLYCERIN  (NITROSTAT ) 0.4 MG SL tablet [id number] Yes Place 1 tablet (0.4 mg total) under the tongue every 5 (five) minutes as needed for chest pain. Nahser, Aleene PARAS, MD  Active Self  PARoxetine  (PAXIL ) 10 MG tablet [id number] Yes Take 10 mg by mouth in the morning. [provider]  Active Self  promethazine  (PHENERGAN ) 12.5 MG tablet [id number] Yes Take 1 tablet (12.5 mg total) by mouth every 6 (six) hours as needed. Signa Delon LABOR, NP  Active   promethazine -dextromethorphan  (PROMETHAZINE -DM) 6.25-15 MG/5ML syrup [id number]  Take 5 mLs by mouth 4 (four) times daily as needed.  Patient not taking: Reported on 04/01/2024   Burnette, Jennifer M, PA-C   Active Self  propranolol  (INDERAL ) 20 MG tablet [id number] Yes TAKE 1 TABLET BY MOUTH THREE TIMES A DAY James, Hochrein, MD  Active   Respiratory Therapy Supplies (NEBULIZER/TUBING/MOUTHPIECE) KIT [id number] Yes Use as directed Geronimo Amel, MD  Active   rosuvastatin  (CRESTOR ) 5 MG tablet [id number]  Take 1 tablet (5 mg total) by mouth daily.  Patient not taking: Reported on 04/01/2024   Obadiah Birmingham, MD  Active Self  solifenacin  (VESICARE ) 5 MG tablet [id number]  Take 1 tablet (5 mg total) by mouth daily.  Patient not taking: Reported on 04/01/2024   Signa Delon LABOR, NP  Active Self  terconazole  (TERAZOL 7 ) 0.4 % vaginal cream [id number]  Place 1 applicator vaginally at bedtime.  Patient not taking: Reported on 04/01/2024   Signa Nest A, NP  Active   TYLENOL  500 MG tablet [id number] Yes Take 500-1,000 mg by mouth every 6 (six) hours as needed for mild pain (pain score 1-3) or headache. [provider]  Active Self  Vitamin D , Ergocalciferol , (DRISDOL ) 1.25 MG (50000 UNIT) CAPS capsule [id number] Yes Take 50,000 Units by mouth every Wednesday. [provider]  Active Self  ziprasidone  (GEODON ) 60 MG capsule [id number] Yes Take 60 mg by mouth at bedtime. [provider]  Active Self            Recommendation:   PCP Follow-up Referral to: [facility] talk therapy Continue Current Plan of Care Call your PCP for Lasix  refill request, I have sent her a message on your behalf as well.  Follow Up Plan:   Telephone follow-up in 1 month  Lyle Rung, BSW, MSW, LCSW Licensed Clinical Social Worker [employer]   [email or web address] [email or web address] Direct Dial : [phone]

## 2024-04-07 NOTE — Patient Instructions (Signed)
 Visit Information  Thank you for taking time to visit with me today. Please don't hesitate to contact me if I can be of assistance to you before our next scheduled appointment.  Our next appointment is by telephone on 04/30/23 at 245 pm Please call the care guide team at [phone] if you need to cancel or reschedule your appointment.   Following is a copy of your care plan:   Goals Addressed             This Visit's Progress    LCSW [facility] Social Work Care Plan         Problems:   Chronic Mental Health needs related to symptoms of depression, stress and anxiety. Patient requires Support, Education, Resources, Referrals, Advocacy, and Care Coordination, in order to meet Unmet Mental Health Needs and to find a therapist and psychiatrist. Mental Health Concerns and Social Isolation Patient lacks knowledge of local and available community resources and counseling agencies.   Clinical Goal(s): verbalize understanding of plan for management of Anxiety, Depression, and Stress symptoms and demonstrate a reduction in symptoms. Patient will connect with a provider for ongoing mental health treatment, increase coping skills, healthy habits, self-management skills, and stress reduction      Patient will implement clinical interventions discussed today to decrease symptoms of depression and increase knowledge and/or ability of: coping skills.    Clinical Interventions:  Assessed patient's previous and current treatment, coping skills, support system and barriers to care. Patient provided hx  Verbalization of feelings encouraged, motivational interviewing employed Emotional support provided, positive coping strategies explored. Establishing healthy boundaries emphasized and healthy self-care education provided Patient was educated on available mental health resources within their area that accept Medicaid and offer counseling and psychiatry. Patient was advised to contact the back of her insurance  card for assistance with benefits as well. Patient is agreeable to referral to [facility] for counseling and psychiatry. [facility] LCSW made referral on 07/12/21.  Patient educated on the difference between therapy and psychiatry per patient request Email sent to patient today with available mental health resources within her area that accept Medicaid and offer the services that pt is interested in. Email included instructions for scheduling at [facility] as well as some crisis support resources and [facility]'s walk in clinic hours. Patient will review resources over the next two weeks and make a decision regarding where pt wishes to gain MH treatment at. Emotional support provided. CBT intervention implemented regarding being mentally fit by combating negative thinking and replacing it with uplifting support, hope and positivity. Patient reports that his medication for depression is no longer working and he needs help with finding psychiatry and counseling. He was successful in identifying triggers to anxiety and depression symptoms, in addition, to healthy coping skills. Patient admits to ongoing alcohol usage and was educated on the mental and physical consequences of this negative coping mechanism. Patient reports significant worsening anxiety impacting their ability to function appropriately and carry out daily task. Assessed social determinant of health barriers Discussed use of relaxation techniques and/or diversional activities to assist with pain reduction (distraction, imagery, relaxation, massage, acupressure, TENS, heat, and cold application; Reviewed with patient prescribed pharmacological and nonpharmacological pain relief strategies; Patient endorsed need for transportation to medical appointments and running errands. LCSW discussed Medicaid and [facility]. Care guide referral completed to assist with registration. Patient was provided transportation resources successfully by Care Guides. Patient  receives strong support from  LCSW provided education on relaxation techniques such as meditation, deep  breathing, massage, grounding exercises or yoga that can activate the body's relaxation response and ease symptoms of stress and anxiety. LCSW ask that when pt is struggling with difficult emotions and racing thoughts that they start this relaxation response process. LCSW provided extensive education on healthy coping skills for anxiety. SW used active and reflective listening, validated patient's feelings/concerns, and provided emotional support. Assessed social determinant of health barriers Patient will work on implementing appropriate self-care habits into their daily routine such as: staying positive, writing a gratitude list, drinking water, staying active around the house, taking their medications as prescribed, combating negative thoughts or emotions and staying connected with their family and friends. Positive reinforcement provided for this decision to work on this. LCSW provided education on healthy sleep hygiene and what that looks like. LCSW encouraged patient to implement a night time routine into their schedule that works best for them and that they are able to maintain. Advised patient to implement deep breathing/grounding/meditation/self-care exercises into their nightly routine to combat racing thoughts at night. LCSW encouraged patient to wake up at the same time each day, make their sleeping environment comfortable, exercise when able, to limit naps and to not eat or drink anything right before bed. Explained to go to bed at the same time each night and get up at the same time each morning, including on the weekends. Make sure your bedroom is quiet, dark, relaxing, and at a comfortable temperature. Remove electronic devices, such as TVs, computers, and smart phones, from the bedroom.    Motivational Interviewing employed Depression screen reviewed  PHQ2/ PHQ9 completed or reviewed   Mindfulness or Relaxation training provided Active listening / Reflection utilized  Advance Care and HCPOA education provided Emotional Support Provided Problem Solving /Task Center strategies reviewed Provided psychoeducation for mental health needs  Provided brief CBT  Reviewed mental health medications and discussed importance of compliance:  Quality of sleep assessed & Sleep Hygiene techniques promoted  Participation in counseling encouraged  Verbalization of feelings encouraged  Suicidal Ideation/Homicidal Ideation assessed: Patient denies SI/HI  Review resources, discussed options and provided patient information about  Mental Health Resources Inter-disciplinary care team collaboration (see longitudinal plan of care)  Patient Goals/Self-Care Activities: Take medications as prescribed   Attend all scheduled provider appointments Call pharmacy for medication refills 3-7 days in advance of running out of medications Perform all self care activities independently  Perform IADL's (shopping, preparing meals, housekeeping, managing finances) independently Call provider office for new concerns or questions Work with the social worker to address care coordination needs and will continue to work with the clinical team to address health care and disease management related needs call 1-800-273-TALK (toll free, 24 hour hotline) If in a crisis, CALL 988 or go to [facility] [address], [place] [phone]) Utilize healthy coping skills and supportive resources discussed Contact PCP with any questions or concerns Keep 90 percent of counseling appointments Call your insurance provider for more information about your Enhanced Benefits  Check out counseling resources provided  Begin personal counseling with LCSW, to reduce and manage symptoms of Depression and Stress, until well-established with mental health provider Accept all calls from  representative with Behavioral Health Providers in an effort to establish ongoing mental health counseling and supportive services. Incorporate into daily practice - relaxation techniques, deep breathing exercises, and mindfulness meditation strategies. Talk about feelings with friends, family members, spiritual advisor, etc. Contact LCSW directly [phone]), if you have questions, need assistance, or if additional  social work needs are identified between now and our next scheduled telephone outreach call. Call 988 for mental health hotline/crisis line if needed (24/7 available) Try techniques to reduce symptoms of anxiety/negative thinking (deep breathing, distraction, positive self talk, etc)  - develop a personal safety plan - exercise at least 2 to 3 times per week - have a plan for how to handle bad days - journal feelings and what helps to feel better or worse - spend time or talk with others at least 2 to 3 times per week - watch for early signs of feeling worse - begin personal counseling - call and visit an old friend - check out volunteer opportunities - join a support group - laugh; watch a funny movie or comedian - learn and use visualization or guided imagery - perform a random act of kindness - practice relaxation or meditation daily - start or continue a personal journal - practice positive thinking and self-talk -continue with compliance of taking medication  -identify current effective and ineffective coping strategies.  -implement positive self-talk in care to increase self-esteem, confidence and feelings of control.  -consider alternative and complementary therapy approaches such as meditation, mindfulness or yoga.  Call your insurance provider to gain education on benefits if desired Call your primary care doctor if symptoms get worse  -journaling, prayer, worship services, meditation or pastoral counseling.  -increase participation in pleasurable group  activities such as hobbies, singing, sports or volunteering).  -consider the use of meditative movement therapy such as tai chi or yoga  -start a regular daily exercise program based on tolerance, ability and patient choice to support positive thinking and activity    Follow Up Plan:  The patient has been provided with contact information for the care management team and has been advised to call with any mental health or health related questions or concerns.  The care management team will reach out to the patient again over the next 30 business  days.   If you are experiencing a Mental Health or Behavioral Health Crisis or need someone to talk to, please call the Suicide and Crisis Lifeline: 988    Patient Goals: Initial goal        Please call the Suicide and Crisis Lifeline: 988 call the [place]  National Suicide Prevention Lifeline: 2811865877 or TTY: (301)224-2350 TTY 9168758375) to talk to a trained counselor call 1-800-273-TALK (toll free, 24 hour hotline) go to [facility] [address], [place] [phone]) call the [facility] Crisis Line: 747-258-1098 call 911 if you are experiencing a Mental Health or Behavioral Health Crisis or need someone to talk to.  Patient verbalized understanding of Care plan and visit instructions communicated this visit  Lyle Rung, BSW, MSW, LCSW Licensed Clinical Social Worker [employer]   [facility] [email or web address] Direct Dial : [phone]

## 2024-04-12 ENCOUNTER — Telehealth: Payer: Self-pay

## 2024-04-12 DIAGNOSIS — F410 Panic disorder [episodic paroxysmal anxiety] without agoraphobia: Secondary | ICD-10-CM | POA: Diagnosis not present

## 2024-04-12 DIAGNOSIS — F3132 Bipolar disorder, current episode depressed, moderate: Secondary | ICD-10-CM | POA: Diagnosis not present

## 2024-04-12 DIAGNOSIS — F411 Generalized anxiety disorder: Secondary | ICD-10-CM | POA: Diagnosis not present

## 2024-04-12 DIAGNOSIS — F4312 Post-traumatic stress disorder, chronic: Secondary | ICD-10-CM | POA: Diagnosis not present

## 2024-04-12 NOTE — Telephone Encounter (Signed)
 Copied from CRM #[id number]. Topic: Clinical - Order For Equipment >> Apr 11, 2024  3:34 PM Joesph PARAS wrote: Reason for CRM: Patient is calling to request a new rx for a nebulizer be placed to [facility], as hers is not working how it normally does.   ATC x1. LMTCB.

## 2024-04-12 NOTE — Telephone Encounter (Signed)
 LMTCB: Pt needs to call [facility] and see if they can take a look at her nebulizer. Insurance only pays every 5 years per [facility]. [facility] sells them w/o rx for $39.99.    Copied from CRM #[id number]. Topic: Clinical - Order For Equipment >> Apr 11, 2024  3:34 PM Joesph PARAS wrote: Reason for CRM: Patient is calling to request a new rx for a nebulizer be placed to [facility], as hers is not working how it normally does.

## 2024-04-13 DIAGNOSIS — B348 Other viral infections of unspecified site: Secondary | ICD-10-CM | POA: Diagnosis not present

## 2024-04-13 NOTE — Telephone Encounter (Signed)
 Copied from CRM #[id number]. Topic: Clinical - Order For Equipment >> Apr 12, 2024 12:39 PM Rozanna MATSU wrote: Pt returning call to clinic    ATC x2. LMTCB Sending mychart message.

## 2024-04-19 ENCOUNTER — Encounter: Payer: Self-pay | Admitting: Cardiology

## 2024-04-19 MED ORDER — AMLODIPINE BESYLATE 10 MG PO TABS: 10.0000 mg | ORAL_TABLET | Freq: Every day | ORAL | 0 refills | Status: AC

## 2024-04-20 ENCOUNTER — Other Ambulatory Visit: Payer: Self-pay

## 2024-04-20 ENCOUNTER — Other Ambulatory Visit

## 2024-04-20 DIAGNOSIS — F411 Generalized anxiety disorder: Secondary | ICD-10-CM

## 2024-04-20 DIAGNOSIS — F332 Major depressive disorder, recurrent severe without psychotic features: Secondary | ICD-10-CM

## 2024-04-20 NOTE — Patient Outreach (Signed)
 Complex Care Management   Visit Note  04/20/2024  Name:  Emily [name] MRN: [id number] DOB: 06/14/1980  Situation: Referral received for Complex Care Management related to Asthma and DMII I obtained verbal consent from Patient.  Visit completed with Patient  on the phone  Background:   Past Medical History:  Diagnosis Date   Abnormal CT of liver    Anxiety    Asthma    Asthma    Bipolar 1 disorder (HCC)    Bipolar disorder (HCC)    Chronic pain    Cirrhosis (HCC)    Depression    Diabetes mellitus without complication (HCC)    Fatty liver    Gallstones    GERD (gastroesophageal reflux disease)    Headache(784.0)    Hyperlipidemia    Hypertension    Hypothyroidism    Lupus (systemic lupus erythematosus) (HCC)    Pulmonary nodule    Tachycardia    Thyroid disease    Vitamin D  deficiency     Assessment: Patient Reported Symptoms:  Cognitive Cognitive Status: No symptoms reported      Neurological Neurological Review of Symptoms: No symptoms reported    HEENT HEENT Symptoms Reported: No symptoms reported HEENT Self-Management Outcome: 2 (bad)    Cardiovascular Cardiovascular Symptoms Reported: Swelling in legs or feet (occasional swelling in legs/feet- Takes furosemide  which is helping) Does patient have uncontrolled Hypertension?: No Cardiovascular Management Strategies: Medication therapy, Routine screening, Adequate rest Weight: 200 lb (90.7 kg) Cardiovascular Self-Management Outcome: 4 (good)  Respiratory Other Respiratory Symptoms: Has been off of O2 x 3 months. No longer smoking and denies any cough or SOB. Respiratory Management Strategies: Adequate rest, Routine screening, Breathing exercise, Breathing techniques Respiratory Self-Management Outcome: 4 (good)  Endocrine Endocrine Symptoms Reported: Weakness or fatigue, Fast heartbeat Is patient diabetic?: Yes Is patient checking blood sugars at home?: Yes List most recent blood sugar readings, include  date and time of day: Patient reports last BS reading was 143 on 04/19/24 Endocrine Self-Management Outcome: 3 (uncertain)  Gastrointestinal Gastrointestinal Symptoms Reported: Constipation Additional Gastrointestinal Details: reports occasional constipation- reports last BM was 04/20/24 Gastrointestinal Management Strategies: Adequate rest, Medication therapy Gastrointestinal Self-Management Outcome: 3 (uncertain)    Genitourinary Genitourinary Symptoms Reported: No symptoms reported Genitourinary Self-Management Outcome: 4 (good)  Integumentary Integumentary Symptoms Reported: No symptoms reported Skin Management Strategies: Routine screening  Musculoskeletal Musculoskelatal Symptoms Reviewed: Back pain Additional Musculoskeletal Details: chronic back pain in the mornings when getting out of bed. Improves once patient is up and moving. Musculoskeletal Management Strategies: Routine screening, Adequate rest Musculoskeletal Self-Management Outcome: 3 (uncertain)      Psychosocial Psychosocial Symptoms Reported: No symptoms reported Additional Psychological Details: Goes to [facility] in [place] and states her mental health is doing well. Patient also working with LCSW for additional counseling needs. Behavioral Management Strategies: Medication therapy, Coping strategies, Counseling Behavioral Health Self-Management Outcome: 4 (good) Major Change/Loss/Stressor/Fears (CP): Medical condition, self Techniques to Cope with Loss/Stress/Change: Counseling Quality of Family Relationships: involved, supportive Do you feel physically threatened by others?: No    04/20/2024    PHQ2-9 Depression Screening   Little interest or pleasure in doing things Not at all  Feeling down, depressed, or hopeless Not at all  PHQ-2 - Total Score 0  Trouble falling or staying asleep, or sleeping too much Several days  Feeling tired or having little energy Nearly every day  Poor appetite or overeating   Several days  Feeling bad about yourself - or that you are a  failure or have let yourself or your family down Not at all  Trouble concentrating on things, such as reading the newspaper or watching television Not at all  Moving or speaking so slowly that other people could have noticed.  Or the opposite - being so fidgety or restless that you have been moving around a lot more than usual Not at all  Thoughts that you would be better off dead, or hurting yourself in some way Not at all  PHQ2-9 Total Score 5  If you checked off any problems, how difficult have these problems made it for you to do your work, take care of things at home, or get along with other people Somewhat difficult  Depression Interventions/Treatment Currently on Treatment, Counseling    Today's Vitals   04/20/24 1544  BP: 117/81  Pulse: 73  SpO2: 97%  Weight: 200 lb (90.7 kg)   Pain Scale: 0-10 Pain Score: 0-No pain  Medications Reviewed Today     Reviewed by Leodis Warren DEL, RN (Registered Nurse) on 04/20/24 at 1542  Med List Status: <None>   Medication Order Taking? Sig Documenting Provider Last Dose Status Informant  ACCU-CHEK AVIVA PLUS test strip [id number] Yes 1 each by Other route 3 (three) times daily. [provider]  Active   ACCU-CHEK FASTCLIX LANCETS MISC [id number] Yes USE AS DIRECTED Tysinger, Alm RAMAN, PA-C  Active   albuterol  (VENTOLIN  HFA) 108 (90 Base) MCG/ACT inhaler [id number] Yes Inhale 2 puffs into the lungs every 6 (six) hours as needed for wheezing or shortness of breath. Ruthell Lauraine FALCON, NP  Active Self  amLODipine  (NORVASC ) 10 MG tablet [id number] Yes Take 1 tablet (10 mg total) by mouth daily. Lavona Agent, MD  Active   benzonatate  (TESSALON ) 100 MG capsule [id number]  Take 1 capsule (100 mg total) by mouth 3 (three) times daily as needed for cough.  Patient not taking: Reported on 04/20/2024   Gladis Elsie BROCKS, PA-C  Active Self  Blood Glucose Monitoring Suppl (ACCU-CHEK AVIVA PLUS)  w/Device KIT [id number] Yes USE AS DIRECTED Bulah Alm RAMAN, PA-C  Active Self  Blood Glucose Monitoring Suppl DEVI [id number] Yes 1 each by Does not apply route in the morning, at noon, and at bedtime. May substitute to any manufacturer covered by patient's insurance. Motwani, Komal, MD  Active Self  budesonide -glycopyrrolate-formoterol (BREZTRI  AEROSPHERE) 160-9-4.8 MCG/ACT AERO inhaler [id number] Yes Inhale 2 puffs into the lungs in the morning and at bedtime. Parrett, Madelin RAMAN, NP  Active   budesonide -glycopyrrolate-formoterol (BREZTRI  AEROSPHERE) 160-9-4.8 MCG/ACT AERO inhaler [id number] Yes Inhale 2 puffs into the lungs in the morning and at bedtime. Parrett, Madelin RAMAN, NP  Active   clonazePAM  (KLONOPIN ) 1 MG tablet [id number] Yes Take 1 mg by mouth 2 (two) times daily as needed for anxiety. [provider]  Active Self  cyanocobalamin  1000 MCG tablet [id number] Yes Take 1 tablet (1,000 mcg total) by mouth daily. Jillian Buttery, MD  Active   fluticasone  (FLONASE ) 50 MCG/ACT nasal spray [id number] Yes Place 2 sprays into both nostrils daily as needed for allergies or rhinitis. Parrett, Madelin RAMAN, NP  Active   folic acid  (FOLVITE ) 1 MG tablet [id number]  Take 1 tablet (1 mg total) by mouth daily.  Patient not taking: Reported on 04/20/2024   Jillian Buttery, MD  Active   furosemide  (LASIX ) 20 MG tablet [id number] Yes Take 1 tablet (20 mg total) by mouth daily as needed. Parrett, Madelin RAMAN, NP  Active   insulin  glargine (LANTUS  SOLOSTAR) 100 UNIT/ML  Solostar Pen [id number] Yes Inject 40 Units into the skin daily. Jillian Buttery, MD  Active   insulin  lispro (HUMALOG  KWIKPEN) 100 UNIT/ML KwikPen [id number] Yes Inject 12 Units into the skin 3 (three) times daily. Jillian Buttery, MD  Active   Insulin  Pen Needle 32G X 4 MM MISC [id number] Yes 1 Device by Does not apply route in the morning, at noon, in the evening, and at bedtime. Adhikari, Amrit, MD  Active   ipratropium (ATROVENT ) 0.03 % nasal spray  [id number]  Place 2 sprays into both nostrils every 12 (twelve) hours.  Patient not taking: Reported on 04/20/2024   Gladis Elsie BROCKS, PA-C  Active Self  ipratropium-albuterol  (DUONEB) 0.5-2.5 (3) MG/3ML SOLN [id number] Yes Take 3 mLs by nebulization in the morning, at noon, and at bedtime. Geronimo Amel, MD  Active Self  lamoTRIgine  (LAMICTAL ) 150 MG tablet [id number] Yes Take 150 mg by mouth at bedtime. [provider]  Active Self  levothyroxine  (SYNTHROID ) 75 MCG tablet [id number] Yes TAKE 1 TABLET BY MOUTH EVERY DAY BEFORE BREAKFAST Williamson, Joanna R, NP  Active   montelukast  (SINGULAIR ) 10 MG tablet [id number] Yes TAKE 1 TABLET BY MOUTH EVERYDAY AT BEDTIME Parrett, Madelin RAMAN, NP  Active Self  Nebulizers (COMPRESSOR/NEBULIZER) MISC [id number] Yes Use as directed Geronimo Amel, MD  Active Self  nicotine  (NICODERM CQ  - DOSED IN MG/24 HOURS) 21 mg/24hr patch [id number] Yes Place 1 patch (21 mg total) onto the skin daily. Jillian Buttery, MD  Active   nitroGLYCERIN  (NITROSTAT ) 0.4 MG SL tablet [id number] Yes Place 1 tablet (0.4 mg total) under the tongue every 5 (five) minutes as needed for chest pain. Nahser, Aleene PARAS, MD  Active Self  PARoxetine  (PAXIL ) 10 MG tablet [id number] Yes Take 10 mg by mouth in the morning. [provider]  Active Self  promethazine  (PHENERGAN ) 12.5 MG tablet [id number] Yes Take 1 tablet (12.5 mg total) by mouth every 6 (six) hours as needed. Signa Delon LABOR, NP  Active   promethazine -dextromethorphan  (PROMETHAZINE -DM) 6.25-15 MG/5ML syrup [id number]  Take 5 mLs by mouth 4 (four) times daily as needed.  Patient not taking: Reported on 04/20/2024   Vivienne Delon HERO, PA-C  Active Self  propranolol  (INDERAL ) 20 MG tablet [id number] Yes TAKE 1 TABLET BY MOUTH THREE TIMES A DAY Hochrein, James, MD  Active   Respiratory Therapy Supplies (NEBULIZER/TUBING/MOUTHPIECE) KIT [id number] Yes Use as directed Geronimo Amel, MD  Active   rosuvastatin  (CRESTOR )  5 MG tablet [id number]  Take 1 tablet (5 mg total) by mouth daily.  Patient not taking: Reported on 04/20/2024   Motwani, Komal, MD  Active Self  solifenacin  (VESICARE ) 5 MG tablet [id number]  Take 1 tablet (5 mg total) by mouth daily.  Patient not taking: Reported on 04/20/2024   Signa Delon LABOR, NP  Active Self  terconazole  (TERAZOL 7 ) 0.4 % vaginal cream [id number]  Place 1 applicator vaginally at bedtime.  Patient not taking: Reported on 04/20/2024   Signa Delon A, NP  Active   TYLENOL  500 MG tablet [id number] Yes Take 500-1,000 mg by mouth every 6 (six) hours as needed for mild pain (pain score 1-3) or headache. [provider]  Active Self  Vitamin D , Ergocalciferol , (DRISDOL ) 1.25 MG (50000 UNIT) CAPS capsule [id number] Yes Take 50,000 Units by mouth every Wednesday. [provider]  Active Self  ziprasidone  (GEODON ) 60 MG capsule [id number] Yes Take 60 mg by mouth at bedtime. [provider]  Active Self  Recommendation:   Continue Current Plan of Care Appointment with liver specialist for 2nd opinion concerning abnormal liver CT. Follow up with LCSW 04/29/24  Follow Up Plan:   Telephone follow up appointment date/time:  05/18/24 at 3 pm with RNCM Channing Warren Quivers RN CM Population Health-Complex Care Management [facility] [phone]

## 2024-04-20 NOTE — Patient Instructions (Signed)
 Visit Information  Emily [name] was given information about Medicaid Managed Care team care coordination services as a part of their Healthy [facility] Medicaid benefit. Nesta L Kimoto   If you would like to schedule transportation through your Healthy [facility] plan, please call the following number at least 2 days in advance of your appointment: [phone]  For information about your ride after you set it up, call Ride Assist at [phone]. Use this number to activate a Will Call pickup, or if your transportation is late for a scheduled pickup. Use this number, too, if you need to make a change or cancel a previously scheduled reservation.  If you need transportation services right away, call [phone]. The after-hours call center is staffed 24 hours to handle ride assistance and urgent reservation requests (including discharges) 365 days a year. Urgent trips include sick visits, hospital discharge requests and life-sustaining treatment.  Call the [facility] Line at [phone], at any time, 24 hours a day, 7 days a week. If you are in danger or need immediate medical attention call 911.   Please see education materials related to diabetes and asthma provided by MyChart link.  Care plan and visit instructions communicated with the patient verbally today. Patient agrees to receive a copy in MyChart. Active MyChart status and patient understanding of how to access instructions and care plan via MyChart confirmed with patient.     Telephone follow up appointment with Managed Medicaid care management team member scheduled for:05/18/24 at 3 pm   Warren Quivers RN CM Population Health-Complex Care [facility] [phone]   Following is a copy of your plan of care:   Goals Addressed             This Visit's Progress    [facility] RN Care Plan       Problems:  Chronic Disease Management support and education needs related to DMII, Pulmonary Disease,  and dx of Chronic Bronchitis, Acute Asthma and recent hx of hospitalization for Acute Respiratory Failure w/ hypoxia  Goal: Over the next 4 months the Patient will Chronic Disease Management support and education needs related to Pulmonary Disease and dx of Chronic Bronchitis, Acute Asthma and recent hx of hospitalization for Acute Respiratory Failure w/ hypoxia  Interventions:   Asthma: Provided patient with basic written and verbal Asthma education on self care/management/and exacerbation prevention Advised patient to track and manage Asthma triggers Provided instruction about proper use of medications used for management of Asthma including inhalers Advised patient to self assesses Asthma action plan zone and make appointment with provider if in the yellow zone for 48 hours without improvement Advised patient to engage in light exercise as tolerated 3-5 days a week to aid in the the management of Asthma Provided education about and advised patient to utilize infection prevention strategies to reduce risk of respiratory infection Discussed the importance of adequate rest and management of fatigue with Asthma Screening for signs and symptoms of depression related to chronic disease state  Assessed social determinant of health barriers   Patient Self-Care Activities:  Attend all scheduled provider appointments Call pharmacy for medication refills 3-7 days in advance of running out of medications Call provider office for new concerns or questions  Perform all self care activities independently  Perform IADL's (shopping, preparing meals, housekeeping, managing finances) independently Take medications as prescribed   Work with the social worker to address care coordination needs and will continue to work with the clinical team to address  health care and disease management related needs Monitor for signs/symptoms of respiratory issues (cough/SOB) Continue to monitor O2 saturation and report  any concerns  Plan:  Next appointment with RN Care Manager is scheduled for 05/18/24 at 3 pm          [facility] RN Care Plan- Diabetes       Problems:  Chronic Disease Management support and education needs related to DMII  Goal: Over the next 4 months the Patient will attend all scheduled medical appointments: pertaining to health maintenance as evidenced by patient report and/or chart review.        continue to work with Medical Illustrator and/or Social Worker to address care management and care coordination needs related to DMII as evidenced by adherence to care management team scheduled appointments     demonstrate Improved adherence to prescribed treatment plan for DMII as evidenced by improved BS and A1C levels. demonstrate Improved health management independence as evidenced by patient reporting decreased BS levels and increased energy.        take all medications exactly as prescribed and will call provider for medication related questions as evidenced by patient report and/or chart review.    verbalize basic understanding of DMII disease process and self health management plan as evidenced by Understanding causes for BS elevation and ways to better control BS levels.  Interventions:   Diabetes Interventions: Assessed patient's understanding of A1c goal: <8% Provided education to patient about basic DM disease process Reviewed medications with patient and discussed importance of medication adherence Counseled on importance of regular laboratory monitoring as prescribed Discussed plans with patient for ongoing care management follow up and provided patient with direct contact information for care management team Provided patient with written educational materials related to hypo and hyperglycemia and importance of correct treatment Screening for signs and symptoms of depression related to chronic disease state  Assessed social determinant of health barriers Lab Results  Component Value  Date   HGBA1C 9.6 (H) 12/07/2023    Patient Self-Care Activities:  Attend all scheduled provider appointments Call pharmacy for medication refills 3-7 days in advance of running out of medications Call provider office for new concerns or questions  Perform all self care activities independently  Take medications as prescribed   schedule appointment with eye doctor check feet daily for cuts, sores or redness enter blood sugar readings and medication or insulin  into daily log take the blood sugar log to all doctor visits set goal weight trim toenails straight across fill half of plate with vegetables keep a food diary limit fast food meals to no more than 1 per week manage portion size prepare main meal at home 3 to 5 days each week set a realistic goal switch to sugar-free drinks keep feet up while sitting wash and dry feet carefully every day  Plan:  Telephone follow up appointment with care management team member scheduled for:  05/18/24 at 3 pm

## 2024-04-22 ENCOUNTER — Other Ambulatory Visit: Payer: Self-pay | Admitting: Cardiology

## 2024-04-27 ENCOUNTER — Encounter: Payer: Self-pay | Admitting: Family Medicine

## 2024-04-27 ENCOUNTER — Telehealth: Payer: Self-pay

## 2024-04-27 ENCOUNTER — Telehealth

## 2024-04-27 ENCOUNTER — Telehealth (INDEPENDENT_AMBULATORY_CARE_PROVIDER_SITE_OTHER): Admitting: Family Medicine

## 2024-04-27 DIAGNOSIS — J029 Acute pharyngitis, unspecified: Secondary | ICD-10-CM | POA: Diagnosis not present

## 2024-04-27 DIAGNOSIS — J4540 Moderate persistent asthma, uncomplicated: Secondary | ICD-10-CM

## 2024-04-27 MED ORDER — AZITHROMYCIN 250 MG PO TABS: ORAL_TABLET | ORAL | 0 refills | Status: AC

## 2024-04-27 NOTE — Progress Notes (Signed)
 Patient ID: Emily [name], female   DOB: [birth year], 44 y.o.   MRN: [id number]   Virtual Visit via Video Note  I connected with Emily [name] on 04/27/2024 at  1:30 PM EST by a video enabled telemedicine application and verified that I am speaking with the correct person using two identifiers.  Location patient: home Location provider:work or home office Persons participating in the virtual visit: patient, provider  I discussed the limitations of evaluation and management by telemedicine and the availability of in person appointments. The patient expressed understanding and agreed to proceed.   HPI: Emily [name] was seen today via virtual visit with chief complaint of sore throat.  She states she has had recurrent strep in the past.  Denies any fever.  Onset couple days ago sore throat.  Occasional dry cough.  No nasal congestion.  No body aches.  She also noticed splotchy tongue.  She has chronic problems including hypertension, asthma, fatty liver, hepatic cirrhosis, history of nicotine  use though she recently quit.  She has past history of alcohol use disorder but quit 2 years ago.  Denies any recent sick contacts.  She has multiple drug allergies listed but has tolerated doxycycline  recently and also Zithromax .   ROS: See pertinent positives and negatives per HPI.  Past Medical History:  Diagnosis Date   Abnormal CT of liver    Anxiety    Asthma    Asthma    Bipolar 1 disorder (HCC)    Bipolar disorder (HCC)    Chronic pain    Cirrhosis (HCC)    Depression    Diabetes mellitus without complication (HCC)    Fatty liver    Gallstones    GERD (gastroesophageal reflux disease)    Headache(784.0)    Hyperlipidemia    Hypertension    Hypothyroidism    Lupus (systemic lupus erythematosus) (HCC)    Pulmonary nodule    Tachycardia    Thyroid disease    Vitamin D  deficiency     Past Surgical History:  Procedure Laterality Date   DILATION AND CURETTAGE OF UTERUS     1 yr ago    UPPER GI ENDOSCOPY  08/28/2022    Family History  Problem Relation Age of Onset   Asthma Mother    Valvular heart disease Father        Mitral valve repair Dr. Dusty 2007   Diabetes Maternal Grandmother    Hypertension Maternal Grandmother     SOCIAL HX: Just recently quit smoking  Current Medications[1]  EXAM:  VITALS per patient if applicable:  GENERAL: alert, oriented, appears well and in no acute distress  HEENT: atraumatic, conjunttiva clear, no obvious abnormalities on inspection of external nose and ears  NECK: normal movements of the head and neck  LUNGS: on inspection no signs of respiratory distress, breathing rate appears normal, no obvious gross SOB, gasping or wheezing  CV: no obvious cyanosis  MS: moves all visible extremities without noticeable abnormality  PSYCH/NEURO: pleasant and cooperative, no obvious depression or anxiety, speech and thought processing grossly intact  ASSESSMENT AND PLAN:  Discussed the following assessment and plan:  Sore throat.  History of frequent strep in the past.  We explained that most sore throats are probably viral.  We also discussed that ideally would like to check at least rapid strep if not culture before deciding on antibiotics.  She had difficulty getting in today.  She does have minimal cough and nasal congestion and frequent strep in the past so  we agreed to go and treat this one-time only with Zithromax  which she has taken in the past.  If symptoms not fully resolving next few days recommend office follow-up to reassess.  She had questions today regarding Wegovy for weight loss.  This apparently was suggested by her liver clinic.  We have encouraged her to set up follow-up with primary to discuss pros and cons of GLP-1 medication.  She does have diabetes out-of-control with most recent A1c 9.6%     I discussed the assessment and treatment plan with the patient. The patient was provided an opportunity to ask  questions and all were answered. The patient agreed with the plan and demonstrated an understanding of the instructions.   The patient was advised to call back or seek an in-person evaluation if the symptoms worsen or if the condition fails to improve as anticipated.     Wolm Scarlet, MD      [1]  Current Outpatient Medications:    ACCU-CHEK AVIVA PLUS test strip, 1 each by Other route 3 (three) times daily., Disp: , Rfl:    ACCU-CHEK FASTCLIX LANCETS MISC, USE AS DIRECTED, Disp: 102 each, Rfl: 3   albuterol  (VENTOLIN  HFA) 108 (90 Base) MCG/ACT inhaler, Inhale 2 puffs into the lungs every 6 (six) hours as needed for wheezing or shortness of breath., Disp: 18 g, Rfl: 3   amLODipine  (NORVASC ) 10 MG tablet, Take 1 tablet (10 mg total) by mouth daily., Disp: 90 tablet, Rfl: 0   benzonatate  (TESSALON ) 100 MG capsule, Take 1 capsule (100 mg total) by mouth 3 (three) times daily as needed for cough., Disp: 30 capsule, Rfl: 0   Blood Glucose Monitoring Suppl (ACCU-CHEK AVIVA PLUS) w/Device KIT, USE AS DIRECTED, Disp: 1 kit, Rfl: 0   Blood Glucose Monitoring Suppl DEVI, 1 each by Does not apply route in the morning, at noon, and at bedtime. May substitute to any manufacturer covered by patient's insurance., Disp: 1 each, Rfl: 0   budesonide -glycopyrrolate-formoterol (BREZTRI  AEROSPHERE) 160-9-4.8 MCG/ACT AERO inhaler, Inhale 2 puffs into the lungs in the morning and at bedtime., Disp: 1 each, Rfl: 5   budesonide -glycopyrrolate-formoterol (BREZTRI  AEROSPHERE) 160-9-4.8 MCG/ACT AERO inhaler, Inhale 2 puffs into the lungs in the morning and at bedtime., Disp: , Rfl:    clonazePAM  (KLONOPIN ) 1 MG tablet, Take 1 mg by mouth 2 (two) times daily as needed for anxiety., Disp: , Rfl:    cyanocobalamin  1000 MCG tablet, Take 1 tablet (1,000 mcg total) by mouth daily., Disp: 60 tablet, Rfl: 0   fluticasone  (FLONASE ) 50 MCG/ACT nasal spray, Place 2 sprays into both nostrils daily as needed for allergies or  rhinitis., Disp: 1 mL, Rfl: 5   folic acid  (FOLVITE ) 1 MG tablet, Take 1 tablet (1 mg total) by mouth daily., Disp: 30 tablet, Rfl: 0   furosemide  (LASIX ) 20 MG tablet, Take 1 tablet (20 mg total) by mouth daily as needed., Disp: 30 tablet, Rfl: 0   insulin  glargine (LANTUS  SOLOSTAR) 100 UNIT/ML Solostar Pen, Inject 40 Units into the skin daily., Disp: 25 mL, Rfl: 0   insulin  lispro (HUMALOG  KWIKPEN) 100 UNIT/ML KwikPen, Inject 12 Units into the skin 3 (three) times daily., Disp: 25 mL, Rfl: 0   Insulin  Pen Needle 32G X 4 MM MISC, 1 Device by Does not apply route in the morning, at noon, in the evening, and at bedtime., Disp: 100 each, Rfl: 0   ipratropium (ATROVENT ) 0.03 % nasal spray, Place 2 sprays into both nostrils every 12 (  twelve) hours., Disp: 30 mL, Rfl: 0   ipratropium-albuterol  (DUONEB) 0.5-2.5 (3) MG/3ML SOLN, Take 3 mLs by nebulization in the morning, at noon, and at bedtime., Disp: 270 mL, Rfl: 5   lamoTRIgine  (LAMICTAL ) 150 MG tablet, Take 150 mg by mouth at bedtime., Disp: , Rfl:    levothyroxine  (SYNTHROID ) 75 MCG tablet, TAKE 1 TABLET BY MOUTH EVERY DAY BEFORE BREAKFAST, Disp: 90 tablet, Rfl: 1   montelukast  (SINGULAIR ) 10 MG tablet, TAKE 1 TABLET BY MOUTH EVERYDAY AT BEDTIME, Disp: 90 tablet, Rfl: 3   Nebulizers (COMPRESSOR/NEBULIZER) MISC, Use as directed, Disp: 1 each, Rfl: 0   nicotine  (NICODERM CQ  - DOSED IN MG/24 HOURS) 21 mg/24hr patch, Place 1 patch (21 mg total) onto the skin daily., Disp: 28 patch, Rfl: 0   nitroGLYCERIN  (NITROSTAT ) 0.4 MG SL tablet, Place 1 tablet (0.4 mg total) under the tongue every 5 (five) minutes as needed for chest pain., Disp: 25 tablet, Rfl: 3   PARoxetine  (PAXIL ) 10 MG tablet, Take 10 mg by mouth in the morning., Disp: , Rfl:    promethazine  (PHENERGAN ) 12.5 MG tablet, Take 1 tablet (12.5 mg total) by mouth every 6 (six) hours as needed., Disp: 30 tablet, Rfl: 1   propranolol  (INDERAL ) 20 MG tablet, TAKE 1 TABLET BY MOUTH THREE TIMES A DAY, Disp:  270 tablet, Rfl: 0   Respiratory Therapy Supplies (NEBULIZER/TUBING/MOUTHPIECE) KIT, Use as directed, Disp: 10 kit, Rfl: 11   TYLENOL  500 MG tablet, Take 500-1,000 mg by mouth every 6 (six) hours as needed for mild pain (pain score 1-3) or headache., Disp: , Rfl:    Vitamin D , Ergocalciferol , (DRISDOL ) 1.25 MG (50000 UNIT) CAPS capsule, Take 50,000 Units by mouth every Wednesday., Disp: , Rfl:    ziprasidone  (GEODON ) 60 MG capsule, Take 60 mg by mouth at bedtime., Disp: , Rfl:

## 2024-04-27 NOTE — Telephone Encounter (Signed)
 Copied from CRM [id number]. Topic: Clinical - Medical Advice >> Apr 27, 2024 12:27 PM Devaughn RAMAN wrote: Reason for CRM: Pt has sore throat and spot on tongue, pt is requesting an antibiotic for this.  Called and spoke to pt - she states she has already seen her PCP for this and was called in an antibiotic. She also requested that I check on the status of her Breztri  inhaler.   She states that [facility] has tried to reach Madelin Stank, NP several times and has not been able to, therefore she has been unable to get her Breztri  inhaler that was prescribed in December.  I spoke to pharmacy team at [facility], who said that they are waiting on her insurance, nothing is needed from our office.  I called pt back and informed her that [facility] is waiting on her insurance, not from us . Pt verbalized understanding.     She wanted me to let [name] know that she was told at a recent doctor's appointment that she had crackling in her lungs, she also stated she has slightly more SOB than normal. She said that this is concerning to her because she recently had double pneumonia. Denies any fever, body aches.

## 2024-04-27 NOTE — Telephone Encounter (Signed)
 Copied from CRM #[id number]. Topic: Clinical - Prescription Issue >> Apr 27, 2024 12:24 PM Devaughn RAMAN wrote: Reason for CRM: Pt called regarding budesonide -glycopyrrolate-formoterol (BREZTRI  AEROSPHERE) 160-9-4.8 MCG/ACT AERO inhaler medication. Pt stated she has been waiting on this medication on 12.12.25, pt stated the medication is at the pharmacy awaiting approval from [facility]. Pt stated the pharmacy has contacted [name] & hasn't been able to speak with her and she wanted to f/u regarding this.  Already handled, NFN.

## 2024-04-27 NOTE — Telephone Encounter (Signed)
 Copied from CRM [id number]. Topic: Clinical - Medication Question >> Apr 27, 2024  3:21 PM Larissa S wrote: Reason for CRM: Patient requesting a prescription for boost strength nutrition drink.

## 2024-04-28 MED ORDER — BREZTRI AEROSPHERE 160-9-4.8 MCG/ACT IN AERO: 2.0000 | INHALATION_SPRAY | Freq: Two times a day (BID) | RESPIRATORY_TRACT | Status: DC

## 2024-04-28 NOTE — Addendum Note (Signed)
 Addended byBETHA JESSICA BOUCHARD S on: 04/28/2024 12:15 PM   Modules accepted: Orders

## 2024-04-28 NOTE — Telephone Encounter (Signed)
 Emily [name] verbally stated to set aside Breztri  samples for pt. I called pt to inform her that they would be placed up front for her, and to pick up within 14 days. Pt verbalized understanding, samples placed up front, NFN.

## 2024-04-29 ENCOUNTER — Other Ambulatory Visit: Payer: Self-pay | Admitting: Licensed Clinical Social Worker

## 2024-04-29 NOTE — Patient Outreach (Signed)
 Complex Care Management   Visit Note  04/29/2024  Name:  Emily [name] MRN: [id number] DOB: December 01, 1980  Situation: Referral received for Complex Care Management related to {Criteria:32550} I obtained verbal consent from {CHL AMB Patient/Caregiver:28184}.  Visit completed with {CHL AMB Patient/Caregiver:28184}  {VISIT LOCATION:32553}  Background:   Past Medical History:  Diagnosis Date   Abnormal CT of liver    Anxiety    Asthma    Asthma    Bipolar 1 disorder (HCC)    Bipolar disorder (HCC)    Chronic pain    Cirrhosis (HCC)    Depression    Diabetes mellitus without complication (HCC)    Fatty liver    Gallstones    GERD (gastroesophageal reflux disease)    Headache(784.0)    Hyperlipidemia    Hypertension    Hypothyroidism    Lupus (systemic lupus erythematosus) (HCC)    Pulmonary nodule    Tachycardia    Thyroid disease    Vitamin D  deficiency     Assessment: Patient Reported Symptoms:  Cognitive        Neurological      HEENT        Cardiovascular      Respiratory      Endocrine      Gastrointestinal        Genitourinary      Integumentary      Musculoskeletal          Psychosocial            04/29/2024    PHQ2-9 Depression Screening   Little interest or pleasure in doing things    Feeling down, depressed, or hopeless    PHQ-2 - Total Score    Trouble falling or staying asleep, or sleeping too much    Feeling tired or having little energy    Poor appetite or overeating     Feeling bad about yourself - or that you are a failure or have let yourself or your family down    Trouble concentrating on things, such as reading the newspaper or watching television    Moving or speaking so slowly that other people could have noticed.  Or the opposite - being so fidgety or restless that you have been moving around a lot more than usual    Thoughts that you would be better off dead, or hurting yourself in some way    PHQ2-9 Total Score    If  you checked off any problems, how difficult have these problems made it for you to do your work, take care of things at home, or get along with other people    Depression Interventions/Treatment      There were no vitals filed for this visit.    Medications Reviewed Today   Medications were not reviewed in this encounter     Recommendation:   {RECOMMENDATONS:32554}  Follow Up Plan:   {FOLLOWUP:32559}  SIG ***

## 2024-05-02 ENCOUNTER — Other Ambulatory Visit: Payer: Self-pay | Admitting: Adult Health

## 2024-05-02 ENCOUNTER — Other Ambulatory Visit: Payer: Self-pay | Admitting: Family Medicine

## 2024-05-02 DIAGNOSIS — Z1231 Encounter for screening mammogram for malignant neoplasm of breast: Secondary | ICD-10-CM

## 2024-05-02 DIAGNOSIS — N6001 Solitary cyst of right breast: Secondary | ICD-10-CM

## 2024-05-02 NOTE — Patient Instructions (Signed)
 Visit Information  Thank you for taking time to visit with me today. Please don't hesitate to contact me if I can be of assistance to you before our next scheduled appointment.  Our next appointment is by telephone on 05/23/24 at 1 Please call the care guide team at [phone] if you need to cancel or reschedule your appointment.   Following is a copy of your care plan:   Goals Addressed             This Visit's Progress    LCSW [facility] Social Work Care Plan         Problems:   Chronic Mental Health needs related to symptoms of depression, stress and anxiety. Patient requires Support, Education, Resources, Referrals, Advocacy, and Care Coordination, in order to meet Unmet Mental Health Needs and to find a therapist and psychiatrist. Mental Health Concerns and Social Isolation Patient lacks knowledge of local and available community resources and counseling agencies.   Clinical Goal(s): verbalize understanding of plan for management of Anxiety, Depression, and Stress symptoms and demonstrate a reduction in symptoms. Patient will connect with a provider for ongoing mental health treatment, increase coping skills, healthy habits, self-management skills, and stress reduction      Patient will implement clinical interventions discussed today to decrease symptoms of depression and increase knowledge and/or ability of: coping skills.    Clinical Interventions:  Assessed patient's previous and current treatment, coping skills, support system and barriers to care. Patient provided hx  Verbalization of feelings encouraged, motivational interviewing employed Emotional support provided, positive coping strategies explored. Establishing healthy boundaries emphasized and healthy self-care education provided Patient was educated on available mental health resources within their area that accept Medicaid and offer counseling and psychiatry. Patient was advised to contact the back of her insurance card  for assistance with benefits as well. Patient is agreeable to referral to [facility] for counseling and psychiatry. [facility] LCSW made referral on 07/12/21.  Patient educated on the difference between therapy and psychiatry per patient request Email sent to patient today with available mental health resources within her area that accept Medicaid and offer the services that pt is interested in. Email included instructions for scheduling at [facility] as well as some crisis support resources and [facility]'s walk in clinic hours. Patient will review resources over the next two weeks and make a decision regarding where pt wishes to gain MH treatment at. Emotional support provided. CBT intervention implemented regarding being mentally fit by combating negative thinking and replacing it with uplifting support, hope and positivity. Patient reports that his medication for depression is no longer working and he needs help with finding psychiatry and counseling. He was successful in identifying triggers to anxiety and depression symptoms, in addition, to healthy coping skills. Patient admits to ongoing alcohol usage and was educated on the mental and physical consequences of this negative coping mechanism. Patient reports significant worsening anxiety impacting their ability to function appropriately and carry out daily task. Assessed social determinant of health barriers Discussed use of relaxation techniques and/or diversional activities to assist with pain reduction (distraction, imagery, relaxation, massage, acupressure, TENS, heat, and cold application; Reviewed with patient prescribed pharmacological and nonpharmacological pain relief strategies; Patient endorsed need for transportation to medical appointments and running errands. LCSW discussed Medicaid and [facility]. Care guide referral completed to assist with registration. Patient was provided transportation resources successfully by Care Guides. Patient receives  strong support from  LCSW provided education on relaxation techniques such as meditation, deep breathing,  massage, grounding exercises or yoga that can activate the body's relaxation response and ease symptoms of stress and anxiety. LCSW ask that when pt is struggling with difficult emotions and racing thoughts that they start this relaxation response process. LCSW provided extensive education on healthy coping skills for anxiety. SW used active and reflective listening, validated patient's feelings/concerns, and provided emotional support. Assessed social determinant of health barriers Patient will work on implementing appropriate self-care habits into their daily routine such as: staying positive, writing a gratitude list, drinking water, staying active around the house, taking their medications as prescribed, combating negative thoughts or emotions and staying connected with their family and friends. Positive reinforcement provided for this decision to work on this. LCSW provided education on healthy sleep hygiene and what that looks like. LCSW encouraged patient to implement a night time routine into their schedule that works best for them and that they are able to maintain. Advised patient to implement deep breathing/grounding/meditation/self-care exercises into their nightly routine to combat racing thoughts at night. LCSW encouraged patient to wake up at the same time each day, make their sleeping environment comfortable, exercise when able, to limit naps and to not eat or drink anything right before bed. Explained to go to bed at the same time each night and get up at the same time each morning, including on the weekends. Make sure your bedroom is quiet, dark, relaxing, and at a comfortable temperature. Remove electronic devices, such as TVs, computers, and smart phones, from the bedroom.    Motivational Interviewing employed Depression screen reviewed  PHQ2/ PHQ9 completed or reviewed  Mindfulness  or Relaxation training provided Active listening / Reflection utilized  Advance Care and HCPOA education provided Emotional Support Provided Problem Solving /Task Center strategies reviewed Provided psychoeducation for mental health needs  Provided brief CBT  Reviewed mental health medications and discussed importance of compliance:  Quality of sleep assessed & Sleep Hygiene techniques promoted  Participation in counseling encouraged  Verbalization of feelings encouraged  Suicidal Ideation/Homicidal Ideation assessed: Patient denies SI/HI  Review resources, discussed options and provided patient information about  Mental Health Resources Inter-disciplinary care team collaboration (see longitudinal plan of care) Patient reports being very sick   Patient Goals/Self-Care Activities: Take medications as prescribed   Attend all scheduled provider appointments Call pharmacy for medication refills 3-7 days in advance of running out of medications Perform all self care activities independently  Perform IADL's (shopping, preparing meals, housekeeping, managing finances) independently Call provider office for new concerns or questions Work with the social worker to address care coordination needs and will continue to work with the clinical team to address health care and disease management related needs call 1-800-273-TALK (toll free, 24 hour hotline) If in a crisis, CALL 988 or go to [facility] [address], [place] [phone]) Utilize healthy coping skills and supportive resources discussed Contact PCP with any questions or concerns Keep 90 percent of counseling appointments Call your insurance provider for more information about your Enhanced Benefits  Check out counseling resources provided  Begin personal counseling with LCSW, to reduce and manage symptoms of Depression and Stress, until well-established with mental health provider Accept all  calls from representative with Behavioral Health Providers in an effort to establish ongoing mental health counseling and supportive services. Incorporate into daily practice - relaxation techniques, deep breathing exercises, and mindfulness meditation strategies. Talk about feelings with friends, family members, spiritual advisor, etc. Contact LCSW directly [phone]), if you have questions,  need assistance, or if additional social work needs are identified between now and our next scheduled telephone outreach call. Call 988 for mental health hotline/crisis line if needed (24/7 available) Try techniques to reduce symptoms of anxiety/negative thinking (deep breathing, distraction, positive self talk, etc)  - develop a personal safety plan - exercise at least 2 to 3 times per week - have a plan for how to handle bad days - journal feelings and what helps to feel better or worse - spend time or talk with others at least 2 to 3 times per week - watch for early signs of feeling worse - begin personal counseling - call and visit an old friend - check out volunteer opportunities - join a support group - laugh; watch a funny movie or comedian - learn and use visualization or guided imagery - perform a random act of kindness - practice relaxation or meditation daily - start or continue a personal journal - practice positive thinking and self-talk -continue with compliance of taking medication  -identify current effective and ineffective coping strategies.  -implement positive self-talk in care to increase self-esteem, confidence and feelings of control.  -consider alternative and complementary therapy approaches such as meditation, mindfulness or yoga.  Call your insurance provider to gain education on benefits if desired Call your primary care doctor if symptoms get worse  -journaling, prayer, worship services, meditation or pastoral counseling.  -increase participation in pleasurable  group activities such as hobbies, singing, sports or volunteering).  -consider the use of meditative movement therapy such as tai chi or yoga  -start a regular daily exercise program based on tolerance, ability and patient choice to support positive thinking and activity    Follow Up Plan:  The patient has been provided with contact information for the care management team and has been advised to call with any mental health or health related questions or concerns.  The care management team will reach out to the patient again over the next 30 business  days.   If you are experiencing a Mental Health or Behavioral Health Crisis or need someone to talk to, please call the Suicide and Crisis Lifeline: 988          Please call the Suicide and Crisis Lifeline: 988 call the [place]  National Suicide Prevention Lifeline: 334-646-7912 or TTY: 8540832228 TTY 574-353-3637) to talk to a trained counselor call 1-800-273-TALK (toll free, 24 hour hotline) go to [facility] [address], [place] [phone]) call 911 if you are experiencing a Mental Health or Behavioral Health Crisis or need someone to talk to.  Patient verbalized understanding of Care plan and visit instructions communicated this visit  Lyle Rung, BSW, MSW, LCSW Licensed Clinical Social Worker [employer]   [email or web address] Direct Dial : [phone]

## 2024-05-03 ENCOUNTER — Encounter: Payer: Self-pay | Admitting: Family Medicine

## 2024-05-03 ENCOUNTER — Ambulatory Visit: Admitting: Family Medicine

## 2024-05-03 VITALS — BP 136/86 | HR 77 | Temp 98.1°F | Ht 63.5 in | Wt 203.0 lb

## 2024-05-03 DIAGNOSIS — K7030 Alcoholic cirrhosis of liver without ascites: Secondary | ICD-10-CM

## 2024-05-03 DIAGNOSIS — E118 Type 2 diabetes mellitus with unspecified complications: Secondary | ICD-10-CM

## 2024-05-03 DIAGNOSIS — L602 Onychogryphosis: Secondary | ICD-10-CM | POA: Diagnosis not present

## 2024-05-03 DIAGNOSIS — Z794 Long term (current) use of insulin: Secondary | ICD-10-CM | POA: Diagnosis not present

## 2024-05-03 DIAGNOSIS — E039 Hypothyroidism, unspecified: Secondary | ICD-10-CM

## 2024-05-03 DIAGNOSIS — E785 Hyperlipidemia, unspecified: Secondary | ICD-10-CM | POA: Diagnosis not present

## 2024-05-03 DIAGNOSIS — E119 Type 2 diabetes mellitus without complications: Secondary | ICD-10-CM

## 2024-05-03 DIAGNOSIS — E1169 Type 2 diabetes mellitus with other specified complication: Secondary | ICD-10-CM

## 2024-05-03 LAB — TSH: TSH: 2.16 u[IU]/mL (ref 0.35–5.50)

## 2024-05-03 LAB — CBC WITH DIFFERENTIAL/PLATELET
Basophils Absolute: 0.1 K/uL (ref 0.0–0.1)
Basophils Relative: 0.8 % (ref 0.0–3.0)
Eosinophils Absolute: 0.2 K/uL (ref 0.0–0.7)
Eosinophils Relative: 1.7 % (ref 0.0–5.0)
HCT: 41.7 % (ref 36.0–46.0)
Hemoglobin: 14.3 g/dL (ref 12.0–15.0)
Lymphocytes Relative: 36.1 % (ref 12.0–46.0)
Lymphs Abs: 3.6 K/uL (ref 0.7–4.0)
MCHC: 34.2 g/dL (ref 30.0–36.0)
MCV: 87.2 fl (ref 78.0–100.0)
Monocytes Absolute: 0.5 K/uL (ref 0.1–1.0)
Monocytes Relative: 5.3 % (ref 3.0–12.0)
Neutro Abs: 5.6 K/uL (ref 1.4–7.7)
Neutrophils Relative %: 56.1 % (ref 43.0–77.0)
Platelets: 185 K/uL (ref 150.0–400.0)
RBC: 4.78 Mil/uL (ref 3.87–5.11)
RDW: 13.5 % (ref 11.5–15.5)
WBC: 9.9 K/uL (ref 4.0–10.5)

## 2024-05-03 LAB — HEMOGLOBIN A1C: Hgb A1c MFr Bld: 11.7 % — ABNORMAL HIGH (ref 4.6–6.5)

## 2024-05-03 LAB — LIPID PANEL
Cholesterol: 203 mg/dL — ABNORMAL HIGH (ref 28–200)
HDL: 37.5 mg/dL — ABNORMAL LOW
LDL Cholesterol: 113 mg/dL — ABNORMAL HIGH (ref 10–99)
NonHDL: 165.87
Total CHOL/HDL Ratio: 5
Triglycerides: 262 mg/dL — ABNORMAL HIGH (ref 10.0–149.0)
VLDL: 52.4 mg/dL — ABNORMAL HIGH (ref 0.0–40.0)

## 2024-05-03 LAB — COMPREHENSIVE METABOLIC PANEL WITH GFR
ALT: 43 U/L — ABNORMAL HIGH (ref 3–35)
AST: 51 U/L — ABNORMAL HIGH (ref 5–37)
Albumin: 4 g/dL (ref 3.5–5.2)
Alkaline Phosphatase: 135 U/L — ABNORMAL HIGH (ref 39–117)
BUN: 6 mg/dL (ref 6–23)
CO2: 26 meq/L (ref 19–32)
Calcium: 9.3 mg/dL (ref 8.4–10.5)
Chloride: 95 meq/L — ABNORMAL LOW (ref 96–112)
Creatinine, Ser: 0.66 mg/dL (ref 0.40–1.20)
GFR: 108.17 mL/min
Glucose, Bld: 336 mg/dL — ABNORMAL HIGH (ref 70–99)
Potassium: 3.9 meq/L (ref 3.5–5.1)
Sodium: 130 meq/L — ABNORMAL LOW (ref 135–145)
Total Bilirubin: 1.1 mg/dL (ref 0.2–1.2)
Total Protein: 8.4 g/dL — ABNORMAL HIGH (ref 6.0–8.3)

## 2024-05-03 LAB — MICROALBUMIN / CREATININE URINE RATIO
Creatinine,U: 63.8 mg/dL
Microalb Creat Ratio: 29.9 mg/g (ref 0.0–30.0)
Microalb, Ur: 1.9 mg/dL (ref 0.7–1.9)

## 2024-05-03 NOTE — Progress Notes (Unsigned)
 "  Established Patient Office Visit   Subjective:  Patient ID: Emily [name], female    DOB: [birth year]  Age: 44 y.o. MRN: [id number]  Chief Complaint  Patient presents with   Medical Management of Chronic Issues    Diabetic check     HPI Emily [name] presents to the clinic with a chief complaint that her liver specialist and social worker requesting her to be put on Wegovy. She says she has liver disease, hard-to-manage diabetes, portal HTN, and is attempting to lose weight. And in attempt for liver transplant due to cirrhosis. She says that she has quit smoking, and drinking, and she needs to get her diabetes under control. Just started seeing a new liver specialist last week. However, based on chart review she is under the care of [facility] in [place] with Dr. Thresa Sale and also has an upcoming appointment with [facility] with Stephane Quest, NP in 09/2024.  Reports mild nausea, no vomiting. Reports decrease in appetite. Denies any pruritus of skin or jaundice. She checks her blood sugar a couple times a day and it is usually in the 400s. Reports that for diabetic management she is currently not taking anything. Reports she stopped Insulin  Glargine 40U daily and Insulin  Humalog  12U TID daily. States she has ran out of these medications.  Was prior seeing  Endocrinology with Dr. Dartha Ernst (last office visit 07/2023, last video visit 09/2023) for diabetic management. Based on chart review, patient has not followed up since. Patient states that she had a disagreement with prior Endocrinologist, and that is why she stopped going.  Hypothyroidism: Chronic. Patient is taking Levothyroxine  75mcg tablet daily. Last TSH was 2.24 on 09/24/2022.   Review of Systems  Constitutional: Negative.   HENT: Negative.    Eyes: Negative.   Respiratory: Negative.    Cardiovascular: Negative.   Gastrointestinal: Negative.   Genitourinary:  Negative.   Musculoskeletal: Negative.   Skin: Negative.   Neurological: Negative.   Psychiatric/Behavioral: Negative.      Objective:    BP 136/86   Pulse 77   Temp 98.1 F (36.7 C) (Oral)   Ht 5' 3.5 (1.613 m)   Wt 203 lb (92.1 kg)   SpO2 93%   BMI 35.40 kg/m  BP Readings from Last 3 Encounters:  05/03/24 136/86  04/20/24 117/81  04/01/24 127/82   Wt Readings from Last 3 Encounters:  05/03/24 203 lb (92.1 kg)  04/20/24 200 lb (90.7 kg)  04/01/24 201 lb (91.2 kg)   Physical Exam Constitutional:      Appearance: Normal appearance. She is obese.  Cardiovascular:     Rate and Rhythm: Normal rate and regular rhythm.     Pulses:          Dorsalis pedis pulses are 2+ on the right side and 2+ on the left side.     Heart sounds: Normal heart sounds.  Pulmonary:     Effort: Pulmonary effort is normal.     Breath sounds: Normal breath sounds.  Musculoskeletal:     Right foot: Normal range of motion. No deformity or bunion.     Left foot: Normal range of motion. No deformity or bunion.  Feet:     Right foot:     Protective Sensation: 10 sites tested.  10 sites sensed.     Skin integrity: Skin integrity normal. No ulcer, blister, skin breakdown or erythema.     Toenail Condition: Right toenails are abnormally thick.  Left foot:     Protective Sensation: 10 sites tested.  10 sites sensed.     Skin integrity: Skin integrity normal. No ulcer, blister, skin breakdown or erythema.     Toenail Condition: Left toenails are abnormally thick.  Skin:    General: Skin is warm and dry.  Neurological:     General: No focal deficit present.     Mental Status: She is alert and oriented to person, place, and time. Mental status is at baseline.  Psychiatric:        Mood and Affect: Mood normal.        Behavior: Behavior normal.        Thought Content: Thought content normal.        Judgment: Judgment normal.   The 10-year ASCVD risk score (Arnett DK, et al., 2019) is: 3.9%    Assessment & Plan:  Type 2 diabetes mellitus without complication, with long-term current use of insulin  (HCC) Assessment & Plan: Uncontrolled. She has stopped taking insulin  that was prescribed by previous endocrinology. Will refill insulin  (Insulin  Glargine 40U daily and Insulin  Humalog  12U TID daily) until she can establish care with [facility]. Placed a referral to endocrinology. Placed a referral to ophthalmology for an eye exam. Foot exam completed today. Placed a referral to podiatry for nail care=thick nails. Ordered CBC, CMP, A1c, and microalbumin/creatinine urine ratio. Not taking a statin or ACE/ARB. Unable to take a statin due to liver disease. However, will order lipid level.   Orders: -     CBC with Differential/Platelet -     Comprehensive metabolic panel with GFR -     Hemoglobin A1c -     Ambulatory referral to Endocrinology -     Ambulatory referral to Ophthalmology -     Ambulatory referral to Podiatry -     Microalbumin / creatinine urine ratio  Alcoholic cirrhosis of liver without ascites (HCC) Assessment & Plan: Continue care with [facility] and [facility]    Hypothyroidism, unspecified type Assessment & Plan: Ordered TSH. On Levothyroxine  75mcg daily.  Orders: -     TSH  Hyperlipidemia associated with type 2 diabetes mellitus (HCC) -     CBC with Differential/Platelet -     Lipid panel  Thickened nails -     Ambulatory referral to Podiatry    Philippe Slade, NP  "

## 2024-05-03 NOTE — Assessment & Plan Note (Signed)
 Referral to [facility]

## 2024-05-03 NOTE — Patient Instructions (Addendum)
 It was nice to see you today. Today we: -Discussed management of chronic conditions. -Completed diabetic foot exam. -Please schedule a diabetic eye exam, as you are due. -Placed a referral to [facility], and [facility]. Please let the provider know if they do not contact you within 2 weeks. -Ordered labs: HgA1c, CBC, CMP, TSH, lipids, diabetic urine eval. -Ordered Insulin  Glargine 40U daily and Insulin  Humalog  12U 3 times a day at meals. It is important that you take your Insulin  as prescribed. Please continue to check and monitor your blood sugar levels.

## 2024-05-03 NOTE — Assessment & Plan Note (Signed)
 Ordered TSH. On Levothyroxine  75mcg daily.

## 2024-05-04 ENCOUNTER — Ambulatory Visit: Admitting: Physician Assistant

## 2024-05-04 ENCOUNTER — Telehealth: Payer: Self-pay | Admitting: *Deleted

## 2024-05-04 ENCOUNTER — Encounter: Payer: Self-pay | Admitting: Family Medicine

## 2024-05-04 ENCOUNTER — Ambulatory Visit: Payer: Self-pay | Admitting: Family Medicine

## 2024-05-04 ENCOUNTER — Encounter: Payer: Self-pay | Admitting: Physician Assistant

## 2024-05-04 VITALS — BP 96/66

## 2024-05-04 DIAGNOSIS — D485 Neoplasm of uncertain behavior of skin: Secondary | ICD-10-CM | POA: Diagnosis not present

## 2024-05-04 DIAGNOSIS — L905 Scar conditions and fibrosis of skin: Secondary | ICD-10-CM

## 2024-05-04 DIAGNOSIS — L700 Acne vulgaris: Secondary | ICD-10-CM

## 2024-05-04 MED ORDER — INSULIN LISPRO (1 UNIT DIAL) 100 UNIT/ML (KWIKPEN): 12.0000 [IU] | PEN_INJECTOR | Freq: Three times a day (TID) | SUBCUTANEOUS | 0 refills | Status: AC

## 2024-05-04 MED ORDER — LANTUS SOLOSTAR 100 UNIT/ML ~~LOC~~ SOPN: 40.0000 [IU] | PEN_INJECTOR | Freq: Every day | SUBCUTANEOUS | 0 refills | Status: AC

## 2024-05-04 MED ORDER — TRETINOIN 0.1 % EX CREA: TOPICAL_CREAM | Freq: Every day | CUTANEOUS | 5 refills | Status: AC

## 2024-05-04 MED ORDER — INSULIN PEN NEEDLE 32G X 4 MM MISC: 1.0000 | Freq: Four times a day (QID) | 0 refills | Status: AC

## 2024-05-04 NOTE — Telephone Encounter (Signed)
 Copied from CRM [id number]. Topic: Clinical - Lab/Test Results >> May 04, 2024  9:39 AM Emily [name] wrote: Reason for CRM: Patient is calling for lab results. She has no further questions.

## 2024-05-04 NOTE — Patient Instructions (Signed)
 Topical retinoid medications like tretinoin/Retin-A, adapalene/Differin, tazarotene/Fabior, and Epiduo/Epiduo Forte can cause dryness and irritation when first started. Only apply a pea-sized amount to the entire affected area. Avoid applying it around the eyes, edges of mouth and creases at the nose. If you experience irritation, use a good moisturizer first and/or apply the medicine less often. If you are doing well with the medicine, you can increase how often you use it until you are applying every night. Be careful with sun protection while using this medication as it can make you sensitive to the sun. This medicine should not be used by pregnant women.      Important Information  Due to recent changes in healthcare laws, you may see results of your pathology and/or laboratory studies on MyChart before the doctors have had a chance to review them. We understand that in some cases there may be results that are confusing or concerning to you. Please understand that not all results are received at the same time and often the doctors may need to interpret multiple results in order to provide you with the best plan of care or course of treatment. Therefore, we ask that you please give [place] 2 business days to thoroughly review all your results before contacting the office for clarification. Should we see a critical lab result, you will be contacted sooner.   If You Need Anything After Your Visit  If you have any questions or concerns for your doctor, please call our main line at [phone] If no one answers, please leave a voicemail as directed and we will return your call as soon as possible. Messages left after 4 pm will be answered the following business day.   You may also send [place] a message via MyChart. We typically respond to MyChart messages within 1-2 business days.  For prescription refills, please ask your pharmacy to contact our office. Our fax number is [phone].  If you have an urgent  issue when the clinic is closed that cannot wait until the next business day, you can page your doctor at the number below.    Please note that while we do our best to be available for urgent issues outside of office hours, we are not available 24/7.   If you have an urgent issue and are unable to reach [place], you may choose to seek medical care at your doctor's office, retail clinic, urgent care center, or emergency room.  If you have a medical emergency, please immediately call 911 or go to the emergency department. In the event of inclement weather, please call our main line at [phone] for an update on the status of any delays or closures.  Dermatology Medication Tips: Please keep the boxes that topical medications come in in order to help keep track of the instructions about where and how to use these. Pharmacies typically print the medication instructions only on the boxes and not directly on the medication tubes.   If your medication is too expensive, please contact our office at [phone] or send [place] a message through MyChart.   We are unable to tell what your co-pay for medications will be in advance as this is different depending on your insurance coverage. However, we may be able to find a substitute medication at lower cost or fill out paperwork to get insurance to cover a needed medication.   If a prior authorization is required to get your medication covered by your insurance company, please allow [place] 1-2 business days to  complete this process.  Drug prices often vary depending on where the prescription is filled and some pharmacies may offer cheaper prices.  The website www.goodrx.com contains coupons for medications through different pharmacies. The prices here do not account for what the cost may be with help from insurance (it may be cheaper with your insurance), but the website can give you the price if you did not use any insurance.  - You can print the associated coupon  and take it with your prescription to the pharmacy.  - You may also stop by our office during regular business hours and pick up a GoodRx coupon card.  - If you need your prescription sent electronically to a different pharmacy, notify our office through [facility] or by phone at [phone]

## 2024-05-04 NOTE — Assessment & Plan Note (Addendum)
 Uncontrolled. She has stopped taking insulin  that was prescribed by previous endocrinology. Will refill insulin  (Insulin  Glargine 40U daily and Insulin  Humalog  12U TID daily) until she can establish care with [facility]. Placed a referral to endocrinology. Placed a referral to ophthalmology for an eye exam. Foot exam completed today. Placed a referral to podiatry for nail care=thick nails. Ordered CBC, CMP, A1c, and microalbumin/creatinine urine ratio. Not taking a statin or ACE/ARB. Unable to take a statin due to liver disease. However, will order lipid level.

## 2024-05-04 NOTE — Progress Notes (Signed)
" ° °  Follow-Up Visit   Subjective  Emily [name] is a 43 y.o. female NEW PATIENT who presents for the following: She has blackheads and whiteheads of her face. She has never been treated for acne. She also has a knot under her chin that gets painful. 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 Previous smoker.    The following portions of the chart were reviewed this encounter and updated as appropriate: medications, allergies, medical history  Review of Systems:  No other skin or systemic complaints except as noted in HPI or Assessment and Plan.  Objective  Well appearing patient in no apparent distress; mood and affect are within normal limits.   A focused examination was performed of the following areas: Face, neck   Relevant exam findings are noted in the Assessment and Plan.        Right underside chin 3.5 cm SQ nodule   Assessment & Plan   ACNE VULGARIS Exam: Open comedones and scars    Treatment Plan: Tretinoin  0.1% cream Apply pea size amount to face every other night and increase as tolerated.     NEOPLASM OF UNCERTAIN BEHAVIOR OF SKIN Right underside chin CYST VS OTHER   We will refer her to Dr Montorfano at [facility] This Visit - Ambulatory referral to Plastic Surgery ACNE VULGARIS   ACNE SCARRING    Return in about 9 months (around 02/01/2025) for Acne.  I, Roseline Hutchinson, CMA, am acting as scribe for Taelyn Broecker K, PA-C .   Documentation: I have reviewed the above documentation for accuracy and completeness, and I agree with the above.  Shailene Demonbreun K, PA-C    "

## 2024-05-04 NOTE — Telephone Encounter (Signed)
 noted

## 2024-05-04 NOTE — Addendum Note (Signed)
 Addended by: Fleet Higham R on: 05/04/2024 07:45 AM   Modules accepted: Orders

## 2024-05-06 ENCOUNTER — Other Ambulatory Visit: Payer: Self-pay | Admitting: Nurse Practitioner

## 2024-05-06 DIAGNOSIS — K760 Fatty (change of) liver, not elsewhere classified: Secondary | ICD-10-CM

## 2024-05-06 DIAGNOSIS — K7469 Other cirrhosis of liver: Secondary | ICD-10-CM

## 2024-05-06 DIAGNOSIS — D376 Neoplasm of uncertain behavior of liver, gallbladder and bile ducts: Secondary | ICD-10-CM

## 2024-05-06 DIAGNOSIS — R748 Abnormal levels of other serum enzymes: Secondary | ICD-10-CM

## 2024-05-09 ENCOUNTER — Telehealth: Payer: Self-pay

## 2024-05-09 NOTE — Telephone Encounter (Signed)
 Copied from CRM [id number]. Topic: Clinical - Medication Refill >> May 09, 2024  1:51 PM LaVerne A wrote: Medication: furosemide  (LASIX ) 20 MG tablet  Has the patient contacted their pharmacy? No. Spoke with PCP who told her to call the office. (Agent: If no, request that the patient contact the pharmacy for the refill. If patient does not wish to contact the pharmacy document the reason why and proceed with request.) (Agent: If yes, when and what did the pharmacy advise?)  This is the patient's preferred pharmacy:  [id number] - [place], [place] - [address] [place] [address] Phone: [phone] Fax: [phone]  Is this the correct pharmacy for this prescription? Yes If no, delete pharmacy and type the correct one.   Has the prescription been filled recently? Yes  Is the patient out of the medication? No and really need it asap  Has the patient been seen for an appointment in the last year OR does the patient have an upcoming appointment? Yes  Can we respond through MyChart? Yes  Agent: Please be advised that Rx refills may take up to 3 business days. We ask that you follow-up with your pharmacy.   I called and spoke to pt. PT states she did not see where a rx was already sent in for her and she had just picked up her rx. NFN

## 2024-05-10 ENCOUNTER — Telehealth: Payer: Self-pay | Admitting: Family Medicine

## 2024-05-10 NOTE — Telephone Encounter (Signed)
 Copied from CRM [id number]. Topic: Referral - Status >> May 10, 2024  8:09 AM Suzen RAMAN wrote: Reason for CRM: [facility] received referral information for patient but didn't receive demographic sheet or insurance information for patient.  After confirming insurance information office stated they don't accept healthy blue medicaid and patient would need to be referred to a different provider.

## 2024-05-12 ENCOUNTER — Other Ambulatory Visit: Payer: Self-pay

## 2024-05-12 ENCOUNTER — Other Ambulatory Visit

## 2024-05-12 MED ORDER — RETIN-A 0.1 % EX CREA: TOPICAL_CREAM | Freq: Every day | CUTANEOUS | 0 refills | Status: AC

## 2024-05-14 ENCOUNTER — Telehealth: Admitting: Family

## 2024-05-14 DIAGNOSIS — R399 Unspecified symptoms and signs involving the genitourinary system: Secondary | ICD-10-CM

## 2024-05-14 DIAGNOSIS — R300 Dysuria: Secondary | ICD-10-CM

## 2024-05-14 MED ORDER — NITROFURANTOIN MONOHYD MACRO 100 MG PO CAPS: 100.0000 mg | ORAL_CAPSULE | Freq: Two times a day (BID) | ORAL | 0 refills | Status: AC

## 2024-05-14 NOTE — Progress Notes (Signed)
 " Virtual Visit Consent   Emily [name], you are scheduled for a virtual visit with a [place] provider today. Just as with appointments in the office, your consent must be obtained to participate. Your consent will be active for this visit and any virtual visit you may have with one of our providers in the next 365 days. If you have a MyChart account, a copy of this consent can be sent to you electronically.  As this is a virtual visit, video technology does not allow for your provider to perform a traditional examination. This may limit your provider's ability to fully assess your condition. If your provider identifies any concerns that need to be evaluated in person or the need to arrange testing (such as labs, EKG, etc.), we will make arrangements to do so. Although advances in technology are sophisticated, we cannot ensure that it will always work on either your end or our end. If the connection with a video visit is poor, the visit may have to be switched to a telephone visit. With either a video or telephone visit, we are not always able to ensure that we have a secure connection.  By engaging in this virtual visit, you consent to the provision of healthcare and authorize for your insurance to be billed (if applicable) for the services provided during this visit. Depending on your insurance coverage, you may receive a charge related to this service.  I need to obtain your verbal consent now. Are you willing to proceed with your visit today? Emily [name] has provided verbal consent on 05/14/2024 for a virtual visit (video or telephone). Bari Learn, FNP  Date: 05/14/2024 1:11 PM   Virtual Visit via Video Note   I, Bari Learn, connected with  Emily [name]  ([id number], 08-Oct-1980) on 05/14/24 at  1:15 PM EST by a video-enabled telemedicine application and verified that I am speaking with the correct person using two identifiers.  Location: Patient: Virtual Visit Location  Patient: Home Provider: Virtual Visit Location Provider: Home Office   I discussed the limitations of evaluation and management by telemedicine and the availability of in person appointments. The patient expressed understanding and agreed to proceed.    History of Present Illness: Emily [name] is a 44 y.o. who identifies as a female who was assigned female at birth, and is being seen today for dysuria that started yesterday that has worsen.  HPI: Dysuria  This is a new problem. The current episode started yesterday. The problem occurs intermittently. The problem has been gradually worsening. The quality of the pain is described as burning. The pain is at a severity of 3/10. The pain is mild. There has been no fever. Associated symptoms include frequency. Pertinent negatives include no hematuria, nausea, urgency or vomiting. She has tried increased fluids for the symptoms. The treatment provided mild relief.    Problems:  Patient Active Problem List   Diagnosis Date Noted   Papanicolaou smear of cervix with positive high risk human papilloma virus (HPV) test 01/25/2024   Sebaceous cyst of breast, right 01/19/2024   Encounter for gynecological examination with Papanicolaou smear of cervix 01/19/2024   History of abnormal cervical Pap smear 01/19/2024   Boil of trunk 01/05/2024   Mastitis, left, acute 01/05/2024   Mastitis, acute 01/05/2024   Viral pneumonia 12/10/2023   Infection due to parainfluenza virus 3 12/06/2023   Severe sepsis (HCC) 12/05/2023   Acute respiratory failure with hypoxia (HCC) 12/05/2023   Mass of  upper outer quadrant of right breast 11/19/2023   Burning with urination 11/19/2023   Pain with urination 11/19/2023   Effusion, left knee 08/26/2023   Positive ANA (antinuclear antibody) 08/13/2023   Menorrhagia with irregular cycle 07/08/2023   Hepatic cirrhosis (HCC) 07/08/2023   PTSD (post-traumatic stress disorder) 06/15/2023   Generalized anxiety disorder  06/15/2023   Panic disorder with agoraphobia 06/15/2023   Major depressive disorder, 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/2023   Bulimia nervosa (HCC) 06/15/2023   Borderline personality disorder (HCC) 06/15/2023   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 06/15/2023   Chronic prescription benzodiazepine use 06/15/2023   History of alcohol use disorder in sustained remission 06/15/2023   Polypharmacy 06/15/2023   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   Caffeine-induced insomnia with snoring 06/15/2023   Irregular bleeding 06/02/2023   Pelvic cramping 06/02/2023   Urinary frequency 06/02/2023   Pregnancy examination or test, negative result 06/02/2023   Missed periods 06/02/2023   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   Sebaceous cyst of labia 02/03/2023   Cyst of left ovary 08/11/2022   ASCUS with positive high risk HPV cervical 08/11/2022   Moody 07/30/2022   Hot flashes 07/30/2022   Friable cervix 07/30/2022   Spotting 07/30/2022   Amenorrhea, secondary 07/30/2022   Bartholin cyst 04/30/2022   Irregular periods 04/30/2022   Dental caries 03/11/2022   Tick bite of back 12/20/2020   Chronic bronchitis (HCC) 07/09/2020   Breast tenderness 09/20/2018   Gastroesophageal reflux disease 09/16/2018   Shortness of breath 08/05/2018   Abnormal finding on lung imaging 08/05/2018   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   Swollen lymph nodes 01/06/2017   RUQ pain 10/08/2016   LLQ pain 10/08/2016   Menstrual period late 10/08/2016   Cyst of right Bartholin's gland 12/17/2015   Recurrent boils 06/18/2015   Type 2 diabetes mellitus (HCC) 04/18/2015   Ingrowing toenail 04/18/2015   Vitamin D  deficiency 04/18/2015   Angular cheilitis 04/18/2015   Dyslipidemia 04/18/2015   Diabetes mellitus with complication in adult patient (HCC) 02/20/2015   Smoker 02/20/2015   Essential hypertension 02/20/2015   Pain in limb 02/20/2015   Myalgia 02/20/2015   Hypothyroidism 02/20/2015   Leg  pain, inferior 02/20/2015   Toenail deformity 02/20/2015   Elevated liver enzymes 01/07/2012   Abnormal CT of liver 09/30/2011   Fatty liver 09/30/2011   G E REFLUX 11/30/2007   CHEST PAIN-UNSPECIFIED 11/30/2007   HEADACHE 10/21/2007   DYSPNEA 09/15/2007   UNSPECIFIED TACHYCARDIA 06/01/2007   COUGH 05/24/2007   TOBACCO ABUSE 05/20/2007   Pulmonary nodules 05/12/2007   DEPRESSION 04/19/2007   Acute asthma exacerbation 04/19/2007    Allergies: Allergies[1] Medications: Current Medications[2]  Observations/Objective: Patient is well-developed, well-nourished in no acute distress.  Resting comfortably  at home.  Head is normocephalic, atraumatic.  No labored breathing.  Speech is clear and coherent with logical content.  Patient is alert and oriented at baseline.   Assessment and Plan: 1. Dysuria (Primary) - nitrofurantoin , macrocrystal-monohydrate, (MACROBID ) 100 MG capsule; Take 1 capsule (100 mg total) by mouth 2 (two) times daily for 5 days. 1 po BId  Dispense: 10 capsule; Refill: 0  2. UTI symptoms - nitrofurantoin , macrocrystal-monohydrate, (MACROBID ) 100 MG capsule; Take 1 capsule (100 mg total) by mouth 2 (two) times daily for 5 days. 1 po BId  Dispense: 10 capsule; Refill: 0  Force fluids AZO over the counter X2 days Start Macrobid  today  Follow up if symptoms worsen or do not improve   Follow Up Instructions:  I discussed the assessment and treatment plan with the patient. The patient was provided an opportunity to ask questions and all were answered. The patient agreed with the plan and demonstrated an understanding of the instructions.  A copy of instructions were sent to the patient via MyChart unless otherwise noted below.     The patient was advised to call back or seek an in-person evaluation if the symptoms worsen or if the condition fails to improve as anticipated.    Bari Learn, FNP    [1]  Allergies Allergen Reactions   Bactrim   [Sulfamethoxazole -Trimethoprim ] Anaphylaxis, Shortness Of Breath and Other (See Comments)    Chest pain, trouble breathing   Ciprofloxacin Palpitations   Clindamycin /Lincomycin Other (See Comments)    Chest Pain   Flagyl  [Metronidazole ] Shortness Of Breath   Hydrocodone Nausea Only   Iodinated Contrast Media Anaphylaxis, Swelling and Other (See Comments)    Body swells, too   Ondansetron Shortness Of Breath   Liraglutide  Nausea And Vomiting and Other (See Comments)    Victoza    Metformin  And Related Other (See Comments)    Tore up my stomach   Penicillins Rash   Synjardy  Xr [Empagliflozin-Metformin  Hcl Er] Rash and Other (See Comments)    Diaper rash  [2]  Current Outpatient Medications:    nitrofurantoin , macrocrystal-monohydrate, (MACROBID ) 100 MG capsule, Take 1 capsule (100 mg total) by mouth 2 (two) times daily for 5 days. 1 po BId, Disp: 10 capsule, Rfl: 0   ACCU-CHEK AVIVA PLUS test strip, 1 each by Other route 3 (three) times daily., Disp: , Rfl:    ACCU-CHEK FASTCLIX LANCETS MISC, USE AS DIRECTED, Disp: 102 each, Rfl: 3   ADVAIR DISKUS 250-50 MCG/ACT AEPB, SMARTSIG:1 Puff(s) Via Inhaler Every 12 Hours, Disp: , Rfl:    albuterol  (VENTOLIN  HFA) 108 (90 Base) MCG/ACT inhaler, Inhale 2 puffs into the lungs every 6 (six) hours as needed for wheezing or shortness of breath., Disp: 18 g, Rfl: 3   amLODipine  (NORVASC ) 10 MG tablet, Take 1 tablet (10 mg total) by mouth daily., Disp: 90 tablet, Rfl: 0   Blood Glucose Monitoring Suppl (ACCU-CHEK AVIVA PLUS) w/Device KIT, USE AS DIRECTED, Disp: 1 kit, Rfl: 0   Blood Glucose Monitoring Suppl DEVI, 1 each by Does not apply route in the morning, at noon, and at bedtime. May substitute to any manufacturer covered by patient's insurance., Disp: 1 each, Rfl: 0   budesonide -glycopyrrolate-formoterol (BREZTRI  AEROSPHERE) 160-9-4.8 MCG/ACT AERO inhaler, Inhale 2 puffs into the lungs in the morning and at bedtime., Disp: 1 each, Rfl: 5   clonazePAM   (KLONOPIN ) 1 MG tablet, Take 1 mg by mouth 2 (two) times daily as needed for anxiety., Disp: , Rfl:    dicyclomine  (BENTYL ) 10 MG capsule, Take 10 mg by mouth., Disp: , Rfl:    fidaxomicin (DIFICID) 200 MG TABS tablet, Take 200 mg by mouth., Disp: , Rfl:    fluticasone  (FLONASE ) 50 MCG/ACT nasal spray, Place 2 sprays into both nostrils daily as needed for allergies or rhinitis., Disp: 1 mL, Rfl: 5   folic acid  (FOLVITE ) 1 MG tablet, Take 1 tablet (1 mg total) by mouth daily., Disp: 30 tablet, Rfl: 0   furosemide  (LASIX ) 20 MG tablet, TAKE 1 TABLET BY MOUTH EVERY DAY AS NEEDED, Disp: 90 tablet, Rfl: 0   insulin  glargine (LANTUS  SOLOSTAR) 100 UNIT/ML Solostar Pen, Inject 40 Units into the skin daily., Disp: 25 mL, Rfl: 0   insulin  lispro (HUMALOG  KWIKPEN) 100 UNIT/ML KwikPen, Inject 12  Units into the skin 3 (three) times daily., Disp: 25 mL, Rfl: 0   Insulin  Pen Needle 32G X 4 MM MISC, 1 Device by Does not apply route in the morning, at noon, in the evening, and at bedtime., Disp: 100 each, Rfl: 0   ipratropium-albuterol  (DUONEB) 0.5-2.5 (3) MG/3ML SOLN, Take 3 mLs by nebulization in the morning, at noon, and at bedtime., Disp: 270 mL, Rfl: 5   lamoTRIgine  (LAMICTAL ) 150 MG tablet, Take 150 mg by mouth at bedtime., Disp: , Rfl:    levothyroxine  (SYNTHROID ) 75 MCG tablet, TAKE 1 TABLET BY MOUTH EVERY DAY BEFORE BREAKFAST, Disp: 90 tablet, Rfl: 1   lithium 300 MG tablet, Take 300 mg by mouth 2 (two) times daily., Disp: , Rfl:    montelukast  (SINGULAIR ) 10 MG tablet, TAKE 1 TABLET BY MOUTH EVERYDAY AT BEDTIME, Disp: 90 tablet, Rfl: 3   Nebulizers (COMPRESSOR/NEBULIZER) MISC, Use as directed, Disp: 1 each, Rfl: 0   nicotine  (NICODERM CQ  - DOSED IN MG/24 HOURS) 21 mg/24hr patch, Place 1 patch (21 mg total) onto the skin daily., Disp: 28 patch, Rfl: 0   nitroGLYCERIN  (NITROSTAT ) 0.4 MG SL tablet, Place 1 tablet (0.4 mg total) under the tongue every 5 (five) minutes as needed for chest pain., Disp: 25 tablet,  Rfl: 3   pantoprazole  (PROTONIX ) 40 MG tablet, Take 40 mg by mouth., Disp: , Rfl:    PARoxetine  (PAXIL ) 10 MG tablet, Take 10 mg by mouth in the morning., Disp: , Rfl:    promethazine  (PHENERGAN ) 12.5 MG tablet, Take 1 tablet (12.5 mg total) by mouth every 6 (six) hours as needed., Disp: 30 tablet, Rfl: 1   propranolol  (INDERAL ) 20 MG tablet, TAKE 1 TABLET BY MOUTH THREE TIMES A DAY, Disp: 270 tablet, Rfl: 0   Respiratory Therapy Supplies (NEBULIZER/TUBING/MOUTHPIECE) KIT, Use as directed, Disp: 10 kit, Rfl: 11   RETIN-A  0.1 % cream, Apply topically at bedtime. Apply pea size amount to face every other night. Increase to nightly as tolerated., Disp: 45 g, Rfl: 0   tretinoin  (RETIN-A ) 0.1 % cream, Apply topically at bedtime. Apply pea size amount to face every other night and increase as tolerated., Disp: 45 g, Rfl: 5   TYLENOL  500 MG tablet, Take 500-1,000 mg by mouth every 6 (six) hours as needed for mild pain (pain score 1-3) or headache., Disp: , Rfl:    Vitamin D , Ergocalciferol , (DRISDOL ) 1.25 MG (50000 UNIT) CAPS capsule, Take 50,000 Units by mouth every Wednesday., Disp: , Rfl:    ziprasidone  (GEODON ) 60 MG capsule, Take 60 mg by mouth at bedtime., Disp: , Rfl:   "

## 2024-05-17 ENCOUNTER — Other Ambulatory Visit: Payer: Self-pay | Admitting: "Endocrinology

## 2024-05-17 ENCOUNTER — Other Ambulatory Visit

## 2024-05-17 NOTE — Telephone Encounter (Signed)
 Requested Prescriptions   Pending Prescriptions Disp Refills   ACCU-CHEK GUIDE TEST test strip [Pharmacy Med Name: ACCU-CHEK GUIDE TEST STRIP] 100 strip 3    Sig: CHECK SUGAR IN THE MORNING, AT NOON, AND AT BEDTIME

## 2024-05-18 ENCOUNTER — Telehealth: Payer: Self-pay

## 2024-05-18 ENCOUNTER — Encounter: Payer: Self-pay | Admitting: Internal Medicine

## 2024-05-18 ENCOUNTER — Ambulatory Visit: Payer: Self-pay

## 2024-05-18 ENCOUNTER — Telehealth: Payer: Self-pay | Admitting: Adult Health

## 2024-05-18 DIAGNOSIS — J45909 Unspecified asthma, uncomplicated: Secondary | ICD-10-CM

## 2024-05-18 DIAGNOSIS — J42 Unspecified chronic bronchitis: Secondary | ICD-10-CM

## 2024-05-18 NOTE — Telephone Encounter (Signed)
 [name]?: Action required by provider: update on patient condition.  Patient was last seen in primary care on 05/14/2024 by Lavell Bari LABOR, FNP.  Called Nurse Triage reporting Bleeding/Bruising.   Triage Disposition: Duplicate Contact Calls  Patient/caregiver understands and will follow disposition?: Unsure    Message from Hewitt B sent at 05/18/2024  2:35 PM EST  Reason for Triage: pt is having bleeding when using bathroom      Reason for Disposition  Caller has already spoken with another triager and has no further questions.  Answer Assessment - Initial Assessment Questions Per chart, pt has been in contact with OBGYN who advised ED and pt spoke to clinic staff for PCP who advised UC. No further concerns at this time.  Protocols used: No Contact or Duplicate Contact Call-A-AH

## 2024-05-18 NOTE — Telephone Encounter (Signed)
 Reason for Triage: pt is having bleeding when using bathroom    Left message to call back about symptoms.

## 2024-05-18 NOTE — Telephone Encounter (Signed)
 Attempted to contact patient x 2 to discuss symptoms; LVM to return call, Will attempt to contact patient at a later time to further discuss concerns.       Message from Adrian B sent at 05/18/2024  2:35 PM EST  Reason for Triage: pt is having bleeding when using bathroom

## 2024-05-18 NOTE — Telephone Encounter (Signed)
 Pt states she feels weak. Pt is bleeding rectally and from vagina. Pt has an endoscopy scheduled for tomorrow. Pt called PCP and they have no openings. Pt was advised to go to Urgent Care for evaluation. Pt voiced understanding. JSY

## 2024-05-18 NOTE — Telephone Encounter (Signed)
 Caseworker called in for pt to request patient speak with a nurse regarding irregular bleeding. Caseworker stated patient was recommended to go to ER, but refused.

## 2024-05-23 ENCOUNTER — Telehealth: Payer: Self-pay | Admitting: Licensed Clinical Social Worker

## 2024-05-23 ENCOUNTER — Encounter: Payer: Self-pay | Admitting: Licensed Clinical Social Worker

## 2024-05-23 NOTE — Patient Instructions (Signed)
 Ruthell L Edberg - I am sorry I was unable to reach you today for our scheduled appointment. I work with [name], NP and am calling to support your healthcare needs. Please contact me at [phone] at your earliest convenience. I look forward to speaking with you soon.   Thank you,  Lyle Rung, BSW, MSW, LCSW Licensed Clinical Social Worker [employer]   [facility] [email or web address] Direct Dial : [phone]

## 2024-05-25 ENCOUNTER — Telehealth: Payer: Self-pay | Admitting: Internal Medicine

## 2024-05-25 NOTE — Telephone Encounter (Signed)
 Could you sign this DME order so I can sent to company? Thank you!

## 2024-05-26 ENCOUNTER — Ambulatory Visit: Admitting: Pharmacist

## 2024-05-26 NOTE — Progress Notes (Unsigned)
 Patient ID: Emily [name]                 DOB: [birth year]                    MRN: [id number]      HPI: Emily [name] is a 44 y.o. female patient referred to lipid clinic by Dr. Lavona. PMH is significant for  coronary artery calcification noted on CT, chest pain, palpitations, hypertension, hyperlipidemia, hypothyroidism, type 2 diabetes, alcoholic cirrhosis of the liver, bipolar disorder, anxiety, asthma, tobacco use and GERD. Coronary calcium  score in 02/2022 was 56 (99th percentile).    Reviewed options for lowering LDL cholesterol, including ezetimibe, PCSK-9 inhibitors, bempedoic acid and inclisiran.  Discussed mechanisms of action, dosing, side effects and potential decreases in LDL cholesterol.  Also reviewed cost information and potential options for patient assistance.   Current Medications:  Intolerances: Premature CAD, diabetes Risk Factors:  LDL-C goal:  ApoB goal:   Diet:   Exercise:   Family History:   Social History:   Labs: Lipid Panel     Component Value Date/Time   CHOL 203 (H) 05/03/2024 1109   CHOL 185 07/16/2021 1412   TRIG 262.0 (H) 05/03/2024 1109   HDL 37.50 (L) 05/03/2024 1109   HDL 36 (L) 07/16/2021 1412   CHOLHDL 5 05/03/2024 1109   VLDL 52.4 (H) 05/03/2024 1109   LDLCALC 113 (H) 05/03/2024 1109   LDLCALC 102 (H) 07/16/2021 1412   LDLDIRECT 128.0 01/16/2023 1041   LABVLDL 47 (H) 07/16/2021 1412    Past Medical History:  Diagnosis Date   Abnormal CT of liver    Anxiety    Asthma    Asthma    Bipolar 1 disorder (HCC)    Bipolar disorder (HCC)    Chronic pain    Cirrhosis (HCC)    Depression    Diabetes mellitus without complication (HCC)    Fatty liver    Gallstones    GERD (gastroesophageal reflux disease)    Headache(784.0)    Hyperlipidemia    Hypertension    Hypothyroidism    Lupus (systemic lupus erythematosus) (HCC)    Pulmonary nodule    Tachycardia    Thyroid disease    Vitamin D  deficiency     Medications  Ordered Prior to Encounter[1]  Allergies[2]  Assessment/Plan:  1. Hyperlipidemia -  No problem-specific Assessment & Plan notes found for this encounter.    Thank you,  Emily [name] D Emily [name], Pharm.Emily [name], Emily [name] [facility] A Division of Emily [name] [address] [place], [place] [address]  Phone: [phone]; Fax: [phone]           [1]  Current Outpatient Medications on File Prior to Visit  Medication Sig Dispense Refill   ACCU-CHEK FASTCLIX LANCETS MISC USE AS DIRECTED 102 each 3   ACCU-CHEK GUIDE TEST test strip CHECK SUGAR IN THE MORNING, AT NOON, AND AT BEDTIME 100 strip 3   ADVAIR DISKUS 250-50 MCG/ACT AEPB SMARTSIG:1 Puff(s) Via Inhaler Every 12 Hours     albuterol  (VENTOLIN  HFA) 108 (90 Base) MCG/ACT inhaler Inhale 2 puffs into the lungs every 6 (six) hours as needed for wheezing or shortness of breath. 18 g 3   amLODipine  (NORVASC ) 10 MG tablet Take 1 tablet (10 mg total) by mouth daily. 90 tablet 0   Blood Glucose Monitoring Suppl (ACCU-CHEK AVIVA PLUS) w/Device KIT USE AS DIRECTED 1 kit 0   Blood Glucose Monitoring Suppl  DEVI 1 each by Does not apply route in the morning, at noon, and at bedtime. May substitute to any manufacturer covered by patient's insurance. 1 each 0   budesonide -glycopyrrolate-formoterol (BREZTRI  AEROSPHERE) 160-9-4.8 MCG/ACT AERO inhaler Inhale 2 puffs into the lungs in the morning and at bedtime. 1 each 5   clonazePAM  (KLONOPIN ) 1 MG tablet Take 1 mg by mouth 2 (two) times daily as needed for anxiety.     dicyclomine  (BENTYL ) 10 MG capsule Take 10 mg by mouth.     fidaxomicin (DIFICID) 200 MG TABS tablet Take 200 mg by mouth.     fluticasone  (FLONASE ) 50 MCG/ACT nasal spray Place 2 sprays into both nostrils daily as needed for allergies or rhinitis. 1 mL 5   folic acid  (FOLVITE ) 1 MG tablet Take 1 tablet (1 mg total) by mouth daily. 30 tablet 0   furosemide  (LASIX ) 20 MG tablet TAKE 1 TABLET BY MOUTH EVERY  DAY AS NEEDED 90 tablet 0   insulin  glargine (LANTUS  SOLOSTAR) 100 UNIT/ML Solostar Pen Inject 40 Units into the skin daily. 25 mL 0   insulin  lispro (HUMALOG  KWIKPEN) 100 UNIT/ML KwikPen Inject 12 Units into the skin 3 (three) times daily. 25 mL 0   Insulin  Pen Needle 32G X 4 MM MISC 1 Device by Does not apply route in the morning, at noon, in the evening, and at bedtime. 100 each 0   ipratropium-albuterol  (DUONEB) 0.5-2.5 (3) MG/3ML SOLN Take 3 mLs by nebulization in the morning, at noon, and at bedtime. 270 mL 5   lamoTRIgine  (LAMICTAL ) 150 MG tablet Take 150 mg by mouth at bedtime.     levothyroxine  (SYNTHROID ) 75 MCG tablet TAKE 1 TABLET BY MOUTH EVERY DAY BEFORE BREAKFAST 90 tablet 1   lithium 300 MG tablet Take 300 mg by mouth 2 (two) times daily.     montelukast  (SINGULAIR ) 10 MG tablet TAKE 1 TABLET BY MOUTH EVERYDAY AT BEDTIME 90 tablet 3   Nebulizers (COMPRESSOR/NEBULIZER) MISC Use as directed 1 each 0   nicotine  (NICODERM CQ  - DOSED IN MG/24 HOURS) 21 mg/24hr patch Place 1 patch (21 mg total) onto the skin daily. 28 patch 0   nitroGLYCERIN  (NITROSTAT ) 0.4 MG SL tablet Place 1 tablet (0.4 mg total) under the tongue every 5 (five) minutes as needed for chest pain. 25 tablet 3   pantoprazole  (PROTONIX ) 40 MG tablet Take 40 mg by mouth.     PARoxetine  (PAXIL ) 10 MG tablet Take 10 mg by mouth in the morning.     promethazine  (PHENERGAN ) 12.5 MG tablet Take 1 tablet (12.5 mg total) by mouth every 6 (six) hours as needed. 30 tablet 1   propranolol  (INDERAL ) 20 MG tablet TAKE 1 TABLET BY MOUTH THREE TIMES A DAY 270 tablet 0   Respiratory Therapy Supplies (NEBULIZER/TUBING/MOUTHPIECE) KIT Use as directed 10 kit 11   RETIN-A  0.1 % cream Apply topically at bedtime. Apply pea size amount to face every other night. Increase to nightly as tolerated. 45 g 0   tretinoin  (RETIN-A ) 0.1 % cream Apply topically at bedtime. Apply pea size amount to face every other night and increase as tolerated. 45 g 5    TYLENOL  500 MG tablet Take 500-1,000 mg by mouth every 6 (six) hours as needed for mild pain (pain score 1-3) or headache.     Vitamin D , Ergocalciferol , (DRISDOL ) 1.25 MG (50000 UNIT) CAPS capsule Take 50,000 Units by mouth every Wednesday.     ziprasidone  (GEODON ) 60 MG capsule Take 60 mg  by mouth at bedtime.     No current facility-administered medications on file prior to visit.  [2]  Allergies Allergen Reactions   Bactrim  [Sulfamethoxazole -Trimethoprim ] Anaphylaxis, Shortness Of Breath and Other (See Comments)    Chest pain, trouble breathing   Ciprofloxacin Palpitations   Clindamycin /Lincomycin Other (See Comments)    Chest Pain   Flagyl  [Metronidazole ] Shortness Of Breath   Hydrocodone Nausea Only   Iodinated Contrast Media Anaphylaxis, Swelling and Other (See Comments)    Body swells, too   Ondansetron Shortness Of Breath   Liraglutide  Nausea And Vomiting and Other (See Comments)    Victoza    Metformin  And Related Other (See Comments)    Tore up my stomach   Penicillins Rash   Synjardy  Xr [Empagliflozin-Metformin  Hcl Er] Rash and Other (See Comments)    Diaper rash

## 2024-06-01 ENCOUNTER — Ambulatory Visit (HOSPITAL_COMMUNITY)

## 2024-06-03 ENCOUNTER — Other Ambulatory Visit

## 2024-06-06 ENCOUNTER — Telehealth: Admitting: Licensed Clinical Social Worker

## 2024-06-15 ENCOUNTER — Ambulatory Visit: Admitting: Cardiology

## 2024-07-14 ENCOUNTER — Encounter

## 2024-07-14 ENCOUNTER — Ambulatory Visit: Admitting: Internal Medicine

## 2025-02-01 ENCOUNTER — Ambulatory Visit: Admitting: Physician Assistant
